# Patient Record
Sex: Female | Born: 1970 | Race: White | Hispanic: No | Marital: Single | Smoking: Former smoker
Health system: Southern US, Community
[De-identification: ages and names within clinical notes are randomized; demographics above are authoritative.]

## PROBLEM LIST (undated history)

## (undated) DIAGNOSIS — F419 Anxiety disorder, unspecified: Secondary | ICD-10-CM

## (undated) DIAGNOSIS — M779 Enthesopathy, unspecified: Secondary | ICD-10-CM

## (undated) DIAGNOSIS — E785 Hyperlipidemia, unspecified: Secondary | ICD-10-CM

## (undated) DIAGNOSIS — N189 Chronic kidney disease, unspecified: Secondary | ICD-10-CM

## (undated) DIAGNOSIS — M1990 Unspecified osteoarthritis, unspecified site: Secondary | ICD-10-CM

## (undated) DIAGNOSIS — M2010 Hallux valgus (acquired), unspecified foot: Secondary | ICD-10-CM

## (undated) DIAGNOSIS — F32A Depression, unspecified: Secondary | ICD-10-CM

## (undated) DIAGNOSIS — M797 Fibromyalgia: Secondary | ICD-10-CM

## (undated) DIAGNOSIS — I739 Peripheral vascular disease, unspecified: Secondary | ICD-10-CM

## (undated) DIAGNOSIS — Z8711 Personal history of peptic ulcer disease: Secondary | ICD-10-CM

## (undated) DIAGNOSIS — I10 Essential (primary) hypertension: Secondary | ICD-10-CM

## (undated) DIAGNOSIS — M2040 Other hammer toe(s) (acquired), unspecified foot: Secondary | ICD-10-CM

## (undated) DIAGNOSIS — Z8719 Personal history of other diseases of the digestive system: Secondary | ICD-10-CM

## (undated) DIAGNOSIS — M21619 Bunion of unspecified foot: Secondary | ICD-10-CM

## (undated) DIAGNOSIS — K589 Irritable bowel syndrome without diarrhea: Secondary | ICD-10-CM

## (undated) DIAGNOSIS — R51 Headache: Secondary | ICD-10-CM

## (undated) DIAGNOSIS — I499 Cardiac arrhythmia, unspecified: Secondary | ICD-10-CM

## (undated) DIAGNOSIS — D649 Anemia, unspecified: Secondary | ICD-10-CM

## (undated) DIAGNOSIS — F329 Major depressive disorder, single episode, unspecified: Secondary | ICD-10-CM

## (undated) DIAGNOSIS — E119 Type 2 diabetes mellitus without complications: Secondary | ICD-10-CM

## (undated) DIAGNOSIS — K219 Gastro-esophageal reflux disease without esophagitis: Secondary | ICD-10-CM

## (undated) DIAGNOSIS — R519 Headache, unspecified: Secondary | ICD-10-CM

## (undated) DIAGNOSIS — J342 Deviated nasal septum: Secondary | ICD-10-CM

## (undated) HISTORY — PX: ABDOMINAL HYSTERECTOMY: SHX81

## (undated) HISTORY — DX: Type 2 diabetes mellitus without complications: E11.9

## (undated) HISTORY — DX: Fibromyalgia: M79.7

## (undated) HISTORY — DX: Essential (primary) hypertension: I10

## (undated) HISTORY — DX: Hyperlipidemia, unspecified: E78.5

## (undated) HISTORY — PX: TUBAL LIGATION: SHX77

## (undated) HISTORY — PX: LEG SURGERY: SHX1003

## (undated) HISTORY — DX: Irritable bowel syndrome, unspecified: K58.9

## (undated) HISTORY — DX: Anemia, unspecified: D64.9

---

## 2008-05-28 ENCOUNTER — Emergency Department (HOSPITAL_COMMUNITY): Admission: EM | Admit: 2008-05-28 | Discharge: 2008-05-29 | Payer: Self-pay | Admitting: Emergency Medicine

## 2008-05-31 ENCOUNTER — Emergency Department (HOSPITAL_COMMUNITY): Admission: EM | Admit: 2008-05-31 | Discharge: 2008-05-31 | Payer: Self-pay | Admitting: Emergency Medicine

## 2011-08-11 ENCOUNTER — Emergency Department (HOSPITAL_COMMUNITY)
Admission: EM | Admit: 2011-08-11 | Discharge: 2011-08-11 | Disposition: A | Discharge status: Home / Self Care | Payer: Self-pay | Attending: Emergency Medicine | Admitting: Emergency Medicine

## 2011-08-11 ENCOUNTER — Encounter (HOSPITAL_COMMUNITY): Payer: Self-pay | Admitting: *Deleted

## 2011-08-11 DIAGNOSIS — F17200 Nicotine dependence, unspecified, uncomplicated: Secondary | ICD-10-CM | POA: Insufficient documentation

## 2011-08-11 DIAGNOSIS — M549 Dorsalgia, unspecified: Secondary | ICD-10-CM

## 2011-08-11 DIAGNOSIS — R070 Pain in throat: Secondary | ICD-10-CM | POA: Insufficient documentation

## 2011-08-11 DIAGNOSIS — Z79899 Other long term (current) drug therapy: Secondary | ICD-10-CM | POA: Insufficient documentation

## 2011-08-11 DIAGNOSIS — M545 Low back pain, unspecified: Secondary | ICD-10-CM | POA: Insufficient documentation

## 2011-08-11 DIAGNOSIS — K219 Gastro-esophageal reflux disease without esophagitis: Secondary | ICD-10-CM | POA: Insufficient documentation

## 2011-08-11 HISTORY — DX: Gastro-esophageal reflux disease without esophagitis: K21.9

## 2011-08-11 MED ORDER — OXYCODONE-ACETAMINOPHEN 5-325 MG PO TABS: 2.0000 | ORAL_TABLET | ORAL | Status: AC | PRN

## 2011-08-11 MED ORDER — KETOROLAC TROMETHAMINE 60 MG/2ML IM SOLN
60.0000 mg | Freq: Once | INTRAMUSCULAR | Status: AC
  Administered 2011-08-11: 60 mg via INTRAMUSCULAR
  Filled 2011-08-11: qty 2

## 2011-08-11 NOTE — ED Notes (Addendum)
Patient reports she bent over 2 days ago and she has had pain since.  She states she has pain in her neck and shoulders. She has intermittent numbness in her right hand.   She is also concerned about a sore throat

## 2011-08-11 NOTE — ED Provider Notes (Signed)
History     CSN: [id number]  Arrival date & time 08/11/11  1435   First MD Initiated Contact with Patient 08/11/11 1521      Chief Complaint  Patient presents with  . Back Pain    (Consider location/radiation/quality/duration/timing/severity/associated sxs/prior treatment) Patient is a 41 y.o. female presenting with back pain. The history is provided by the patient.  Back Pain  Pertinent negatives include no fever, no numbness, no headaches, no dysuria and no weakness.   the patient is a 41 year old, female, who complains of lower back pain since 2 days ago.  She was bending over to put on her shoes when the pain started.  Yesterday.  It occurred twice and then, today.  It has occurred 5 times.  She denies nausea, vomiting, fevers, chills, urinary tract symptoms.  She denies radiation into her lower extremities.  She denies weakness, or paresthesias.  She also has a sore throat, which has been going on for several weeks.  Past Medical History  Diagnosis Date  . GERD (gastroesophageal reflux disease)     Past Surgical History  Procedure Date  . Leg surgery   . Tubal ligation   . Abdominal surgery     No family history on file.  History  Substance Use Topics  . Smoking status: Current Everyday Smoker  . Smokeless tobacco: Current User  . Alcohol Use: Yes    OB History    Grav Para Term Preterm Abortions TAB SAB Ect Mult Living                  Review of Systems  Constitutional: Negative for fever and chills.  HENT: Positive for sore throat.   Gastrointestinal: Negative for nausea and vomiting.  Genitourinary: Negative for dysuria and hematuria.  Musculoskeletal: Positive for back pain.  Skin: Negative for rash.  Neurological: Negative for weakness, numbness and headaches.  All other systems reviewed and are negative.    Allergies  Review of patient's allergies indicates no known allergies.  Home Medications   Current Outpatient Rx  Name Route Sig  Dispense Refill  . ALUM HYDROXIDE-MAG CARBONATE 508-475 MG/10ML PO SUSP Oral Take 30 mLs by mouth 4 (four) times daily as needed. For indigestion    . HYDROCODONE-ACETAMINOPHEN 5-325 MG PO TABS Oral Take 1 tablet by mouth every 6 (six) hours as needed. For pain    . OMEPRAZOLE 20 MG PO CPDR Oral Take 20 mg by mouth daily.      BP 132/88  Pulse 84  Temp(Src) 97.6 F (36.4 C) (Oral)  Resp 18  SpO2 98%  Physical Exam  Vitals reviewed. Constitutional: She is oriented to person, place, and time. She appears well-developed and well-nourished.  HENT:  Head: Normocephalic and atraumatic.  Mouth/Throat: Oropharynx is clear and moist. No oropharyngeal exudate.  Eyes: Conjunctivae and EOM are normal.  Neck: Normal range of motion. Neck supple.  Cardiovascular:  No murmur heard. Pulmonary/Chest: Effort normal.  Abdominal: Soft. She exhibits no distension.  Musculoskeletal:       Lumbar paraspinal tenderness  Neurological: She is alert and oriented to person, place, and time.  Skin: Skin is warm and dry.  Psychiatric: She has a normal mood and affect. Thought content normal.    ED Course  Procedures (including critical care time) Probable muscular back pain.  No history of trauma.  No systemic symptoms.  No urinary tract symptoms There is no indication for testing  Labs Reviewed - No data to display No results found.  No diagnosis found.    MDM  Back pain        Cheri Guppy, MD 08/11/11 1549

## 2011-08-11 NOTE — Discharge Instructions (Signed)
Use ibuprofen 600 mg every 6 hours for pain.  Use Percocet for more severe pain.  Followup with your Dr. if your symptoms.  Last wound 3-4 days.  Return for worse or uncontrolled symptoms

## 2012-04-05 ENCOUNTER — Emergency Department (HOSPITAL_COMMUNITY)
Admission: EM | Admit: 2012-04-05 | Discharge: 2012-04-05 | Disposition: A | Discharge status: Home / Self Care | Payer: No Typology Code available for payment source | Source: Home / Self Care

## 2012-04-05 ENCOUNTER — Emergency Department (HOSPITAL_COMMUNITY): Payer: No Typology Code available for payment source

## 2012-04-05 ENCOUNTER — Encounter (HOSPITAL_COMMUNITY): Payer: Self-pay

## 2012-04-05 ENCOUNTER — Ambulatory Visit (HOSPITAL_COMMUNITY)
Admission: RE | Admit: 2012-04-05 | Discharge: 2012-04-05 | Disposition: A | Discharge status: Home / Self Care | Payer: No Typology Code available for payment source | Source: Ambulatory Visit | Attending: Family Medicine | Admitting: Family Medicine

## 2012-04-05 ENCOUNTER — Ambulatory Visit (HOSPITAL_COMMUNITY): Admission: RE | Admit: 2012-04-05 | Payer: No Typology Code available for payment source | Source: Ambulatory Visit

## 2012-04-05 DIAGNOSIS — M5030 Other cervical disc degeneration, unspecified cervical region: Secondary | ICD-10-CM

## 2012-04-05 DIAGNOSIS — J329 Chronic sinusitis, unspecified: Secondary | ICD-10-CM

## 2012-04-05 DIAGNOSIS — K219 Gastro-esophageal reflux disease without esophagitis: Secondary | ICD-10-CM

## 2012-04-05 DIAGNOSIS — M542 Cervicalgia: Secondary | ICD-10-CM | POA: Insufficient documentation

## 2012-04-05 DIAGNOSIS — M62838 Other muscle spasm: Secondary | ICD-10-CM

## 2012-04-05 DIAGNOSIS — M5380 Other specified dorsopathies, site unspecified: Secondary | ICD-10-CM | POA: Insufficient documentation

## 2012-04-05 DIAGNOSIS — R209 Unspecified disturbances of skin sensation: Secondary | ICD-10-CM | POA: Insufficient documentation

## 2012-04-05 DIAGNOSIS — M79609 Pain in unspecified limb: Secondary | ICD-10-CM | POA: Insufficient documentation

## 2012-04-05 DIAGNOSIS — M25519 Pain in unspecified shoulder: Secondary | ICD-10-CM | POA: Insufficient documentation

## 2012-04-05 MED ORDER — PREDNISONE 20 MG PO TABS: 20.0000 mg | ORAL_TABLET | Freq: Every day | ORAL | Status: DC

## 2012-04-05 MED ORDER — AMOXICILLIN-POT CLAVULANATE 875-125 MG PO TABS: 1.0000 | ORAL_TABLET | Freq: Two times a day (BID) | ORAL | Status: DC

## 2012-04-05 MED ORDER — OMEPRAZOLE 20 MG PO CPDR: 20.0000 mg | DELAYED_RELEASE_CAPSULE | Freq: Every day | ORAL | Status: DC

## 2012-04-05 MED ORDER — CYCLOBENZAPRINE HCL 5 MG PO TABS: 5.0000 mg | ORAL_TABLET | Freq: Three times a day (TID) | ORAL | Status: DC | PRN

## 2012-04-05 MED ORDER — FLUTICASONE PROPIONATE 50 MCG/ACT NA SUSP: 2.0000 | Freq: Every day | NASAL | Status: DC

## 2012-04-05 MED ORDER — HYDROCODONE-ACETAMINOPHEN 5-325 MG PO TABS: 1.0000 | ORAL_TABLET | Freq: Four times a day (QID) | ORAL | Status: DC | PRN

## 2012-04-05 MED ORDER — GADOBENATE DIMEGLUMINE 529 MG/ML IV SOLN
15.0000 mL | Freq: Once | INTRAVENOUS | Status: AC | PRN
  Administered 2012-04-05: 15 mL via INTRAVENOUS

## 2012-04-05 NOTE — ED Provider Notes (Signed)
History     CSN: [id number]  Arrival date & time 04/05/12  1135  Chief Complaint  Patient presents with  . Neck Pain  Patient is a 41 y.o. female presenting with neck pain.  Neck Pain  Associated symptoms include numbness and weakness.  Pt reports that she has been having chronic pain in neck and shoulder for the past year.  She has been having constant throbbing and numbness of right hand and arm and now involving the left shoulder and arm. Pt has never had xrays and treated with pain meds but says that symptoms have given.  Pt says she is having constant pain and has been concerned because she is having trouble sleeping and having trouble dealing with the constant discomfort and says that she is having trouble working because she can't stand for long periods of time.  Pt says that she has chronic sinus pain, sinus headaches and postnasal drainage symptoms.    Past Medical History  Diagnosis Date  . GERD (gastroesophageal reflux disease)     Past Surgical History  Procedure Date  . Leg surgery   . Tubal ligation   . Abdominal surgery     No family history on file.  History  Substance Use Topics  . Smoking status: Current Every Day Smoker  . Smokeless tobacco: Current User  . Alcohol Use: Yes    OB History    Grav Para Term Preterm Abortions TAB SAB Ect Mult Living                 Review of Systems  HENT: Positive for nosebleeds, congestion, rhinorrhea, sneezing, neck pain and postnasal drip.   Eyes: Negative.   Respiratory: Negative.   Cardiovascular: Negative.   Gastrointestinal: Negative.   Neurological: Positive for weakness and numbness.  Hematological: Negative.   Psychiatric/Behavioral: Negative.     Allergies  Review of patient's allergies indicates no known allergies.  Home Medications   Current Outpatient Rx  Name  Route  Sig  Dispense  Refill  . OMEPRAZOLE 20 MG PO CPDR   Oral   Take 20 mg by mouth daily.         Marland Kitchen ALUM HYDROXIDE-MAG  CARBONATE 508-475 MG/10ML PO SUSP   Oral   Take 30 mLs by mouth 4 (four) times daily as needed. For indigestion         . HYDROCODONE-ACETAMINOPHEN 5-325 MG PO TABS   Oral   Take 1 tablet by mouth every 6 (six) hours as needed. For pain           BP 131/86  Pulse 88  Temp 97.7 F (36.5 C) (Oral)  Resp 19  SpO2 100%  LMP 03/22/2012  Physical Exam  Constitutional: She is oriented to person, place, and time. She appears well-developed and well-nourished.  HENT:  Head: Normocephalic and atraumatic.  Nose: Mucosal edema, rhinorrhea and sinus tenderness present. No nasal septal hematoma. No epistaxis.  No foreign bodies.  Eyes: EOM are normal. Pupils are equal, round, and reactive to light.  Neck: Normal range of motion. Neck supple.  Cardiovascular: Normal rate, regular rhythm and normal heart sounds.   Pulmonary/Chest: Effort normal and breath sounds normal.  Abdominal: Soft. Bowel sounds are normal.  Musculoskeletal: She exhibits tenderness.       Decreased range of motion of neck due to pain Mildly diminished grip strength 4/5 on Right   Neurological: She is alert and oriented to person, place, and time.  Skin: Skin is warm and  dry.  Psychiatric: She has a normal mood and affect. Her behavior is normal. Judgment and thought content normal.    ED Course  Procedures (including critical care time)  Labs Reviewed - No data to display No results found.   No diagnosis found.  MDM  IMPRESSION  Neck Pain  Suspected bulging cervical disc  Chronic Neck Pain  Insomnia  GERD  Chronic sinusitis  RECOMMENDATIONS / PLAN  Trial of Prednisone taper as ordered over 3 week period MRI c-spine/t-spine Flexeril 5mg  TID Hydrocodone 5 prn pain Augmentin 875mg  po bid #20 flonase NS  Omeprazole 20 mg daily  FOLLOW UP 1 week  The patient was given clear instructions to go to ER or return to medical center if symptoms don't improve, worsen or new problems develop.  The  patient verbalized understanding.  The patient was told to call to get lab results if they haven't heard anything in the next week.            Cleora Fleet, MD 04/05/12 1535

## 2012-04-05 NOTE — ED Notes (Signed)
C/o pain in neck arm and shoulders. Also states has sores in her throat

## 2012-04-06 ENCOUNTER — Telehealth (HOSPITAL_COMMUNITY): Payer: Self-pay

## 2012-04-06 NOTE — Telephone Encounter (Signed)
Message copied by Lestine Mount on Thu Apr 06, 2012  4:38 PM ------      Message from: Cleora Fleet      Created: Thu Apr 06, 2012  3:16 PM      Regarding: MRI Results       Please call patient with MRI Results            IMPRESSION:      1.  Tiny central disc bulge at C5-6 with no neural impingement.      2.  A tiny spurs at C3-4 with no neural impingement.      3.  Otherwise, normal MRI of the cervical and thoracic spine.  No findings to explain the patient's symptoms.            I recommend that the patient see a neurologist for referral.              Maryln Manuel, MD

## 2012-06-08 ENCOUNTER — Emergency Department (HOSPITAL_COMMUNITY)
Admission: EM | Admit: 2012-06-08 | Discharge: 2012-06-08 | Disposition: A | Discharge status: Home / Self Care | Payer: No Typology Code available for payment source | Source: Home / Self Care | Attending: Family Medicine | Admitting: Family Medicine

## 2012-06-08 ENCOUNTER — Encounter (HOSPITAL_COMMUNITY): Payer: Self-pay

## 2012-06-08 DIAGNOSIS — M542 Cervicalgia: Secondary | ICD-10-CM

## 2012-06-08 DIAGNOSIS — K219 Gastro-esophageal reflux disease without esophagitis: Secondary | ICD-10-CM

## 2012-06-08 DIAGNOSIS — J329 Chronic sinusitis, unspecified: Secondary | ICD-10-CM

## 2012-06-08 DIAGNOSIS — J309 Allergic rhinitis, unspecified: Secondary | ICD-10-CM

## 2012-06-08 MED ORDER — AMOXICILLIN-POT CLAVULANATE 875-125 MG PO TABS: 1.0000 | ORAL_TABLET | Freq: Two times a day (BID) | ORAL | Status: DC

## 2012-06-08 MED ORDER — AMITRIPTYLINE HCL 50 MG PO TABS: ORAL_TABLET | ORAL | Status: DC

## 2012-06-08 MED ORDER — CYCLOBENZAPRINE HCL 5 MG PO TABS: 5.0000 mg | ORAL_TABLET | Freq: Three times a day (TID) | ORAL | Status: DC | PRN

## 2012-06-08 MED ORDER — LORATADINE 10 MG PO TABS: 10.0000 mg | ORAL_TABLET | Freq: Every day | ORAL | Status: DC

## 2012-06-08 MED ORDER — MOMETASONE FUROATE 50 MCG/ACT NA SUSP: 1.0000 | Freq: Every day | NASAL | Status: DC

## 2012-06-08 NOTE — ED Notes (Signed)
Complains of sore throat  Sinus issues Pain on right side of head Neck, back and shoulder pain

## 2012-06-08 NOTE — ED Provider Notes (Signed)
History   CSN: [id number]  Arrival date & time 06/08/12  1522   First MD Initiated Contact with Patient 06/08/12 1700    Chief Complaint  Patient presents with  . Sore Throat  . Facial Pain   HPI Pt is presenting today to report that she is still having sinus pressure and drainage.  She is having sinus headaches.  Her neck pain has improved but still present.  She is still having spasms in her neck.  I reviewed her MRI results with her today.   She says she is having trouble sleeping and having some ulcers in the mouth.  She says that she has been taking a lot of ibuprofen recently and had been taking it every 4 hours.  I explained to her that she should not be taking ibuprofen more than every 8 hours and it placed her at risk for ulcers and organ damage.  The patient verbalized understanding.  She is not using the flonase [facility] any longer.   Past Medical History  Diagnosis Date  . GERD (gastroesophageal reflux disease)     Past Surgical History  Procedure Laterality Date  . Leg surgery    . Tubal ligation    . Abdominal surgery      No family history on file.  History  Substance Use Topics  . Smoking status: Current Every Day Smoker  . Smokeless tobacco: Current User  . Alcohol Use: Yes    OB History   Grav Para Term Preterm Abortions TAB SAB Ect Mult Living                 Review of Systems  Constitutional: Positive for fatigue.  HENT: Positive for congestion, sore throat, rhinorrhea, sneezing, postnasal drip and sinus pressure.   Musculoskeletal: Positive for arthralgias.  All other systems reviewed and are negative.   Allergies  Review of patient's allergies indicates no known allergies.  Home Medications   Current Outpatient Rx  Name  Route  Sig  Dispense  Refill  . amoxicillin-clavulanate (AUGMENTIN) 875-125 MG per tablet   Oral   Take 1 tablet by mouth 2 (two) times daily.   20 tablet   0   .  cyclobenzaprine (FLEXERIL) 5 MG tablet   Oral   Take 1 tablet (5 mg total) by mouth 3 (three) times daily as needed for muscle spasms.   30 tablet   0   . fluticasone (FLONASE) 50 MCG/ACT nasal spray   Nasal   Place 2 sprays into the nose daily.   16 g   2   . HYDROcodone-acetaminophen (NORCO/VICODIN) 5-325 MG per tablet   Oral   Take 1 tablet by mouth every 6 (six) hours as needed. For pain   30 tablet   0   . omeprazole (PRILOSEC) 20 MG capsule   Oral   Take 1 capsule (20 mg total) by mouth daily.   30 capsule   3   . predniSONE (DELTASONE) 20 MG tablet   Oral   Take 1 tablet (20 mg total) by mouth daily. TAKE 3 TABS PO DAILY FOR 7 DAYS THEN TAKE 2 TABS PO DAILY FOR 7 DAYS, THEN TAKE 1 TAB PO DAILY FOR 7 DAYS   42 tablet   0    BP 121/80  Pulse 75  Temp(Src) 98.4 F (36.9 C) (Oral)  Resp 16  SpO2 98%  Physical Exam  Nursing note and vitals reviewed. Constitutional:  She is oriented to person, place, and time. She appears well-developed and well-nourished. No distress.  HENT:  Head: Normocephalic and atraumatic.  Nose: Mucosal edema, rhinorrhea and sinus tenderness present. Right sinus exhibits maxillary sinus tenderness. Left sinus exhibits maxillary sinus tenderness.  Eyes: Conjunctivae are normal. Pupils are equal, round, and reactive to light.  Neck: Normal range of motion. Neck supple. No JVD present. No thyromegaly present.  Cardiovascular: Normal rate, regular rhythm and normal heart sounds.   Pulmonary/Chest: Effort normal and breath sounds normal. No respiratory distress. She has no wheezes. She has no rales. She exhibits no tenderness.  Abdominal: Soft. Bowel sounds are normal. She exhibits no distension and no mass. There is no tenderness. There is no rebound and no guarding.  Musculoskeletal: Normal range of motion. She exhibits tenderness.  Spasm on muscles of neck and shoulder   Lymphadenopathy:    She has no cervical adenopathy.  Neurological: She  is alert and oriented to person, place, and time. No cranial nerve deficit. She exhibits normal muscle tone. Coordination normal.  Skin: Skin is warm and dry. No rash noted. No erythema. No pallor.  Psychiatric: She has a normal mood and affect. Her behavior is normal. Judgment and thought content normal.   ED Course  Procedures (including critical care time)  Labs Reviewed - No data to display No results found.  No diagnosis found.  MDM  IMPRESSION  Sinusitis  Sinus headache  Cervical neck spasm  Insomnia  Chronic pain  Allergic rhinitis  GERD  NSAID misuse  RECOMMENDATIONS / PLAN Will try another course of augmentin 875 mg po bid, nasonex nasal spray, loratadine 10 mg po daily, Pt may need ENT referral if no improvement after 1 week Trial of amitriptyline 25 mg by mouth each bedtime for one week titrating to 50 mg by mouth each bedtime to help with her chronic pain and insomnia and stress anxiety I asked her followup in one week if her sinuses are clearing up.  Otherwise follow up in one month for medical checkup.  The patient was strongly advised against the misuse of NSAIDS and the dangers associated with misuse of these drugs. The patient verbalized understanding.   FOLLOW UP 1 week if not improving 1 month for regular medical follow up   The patient was given clear instructions to go to ER or return to medical center if symptoms don't improve, worsen or new problems develop.  The patient verbalized understanding.  The patient was told to call to get lab results if they haven't heard anything in the next week.            Cleora Fleet, MD 06/08/12 1950

## 2012-06-09 NOTE — ED Notes (Signed)
[facility]  [facility] @ 8[facility]45am

## 2012-06-09 NOTE — ED Notes (Signed)
[facility]

## 2012-06-23 ENCOUNTER — Encounter: Payer: No Typology Code available for payment source | Admitting: Obstetrics & Gynecology

## 2012-07-06 ENCOUNTER — Emergency Department (HOSPITAL_COMMUNITY)
Admission: EM | Admit: 2012-07-06 | Discharge: 2012-07-06 | Disposition: A | Discharge status: Home / Self Care | Payer: No Typology Code available for payment source | Source: Home / Self Care

## 2012-07-06 ENCOUNTER — Encounter: Payer: Self-pay | Admitting: Obstetrics & Gynecology

## 2012-07-06 ENCOUNTER — Encounter (HOSPITAL_COMMUNITY): Payer: Self-pay

## 2012-07-06 DIAGNOSIS — J309 Allergic rhinitis, unspecified: Secondary | ICD-10-CM

## 2012-07-06 MED ORDER — AMLODIPINE BESYLATE 10 MG PO TABS: 10.0000 mg | ORAL_TABLET | Freq: Every day | ORAL | Status: DC

## 2012-07-06 MED ORDER — CYCLOBENZAPRINE HCL 5 MG PO TABS: 5.0000 mg | ORAL_TABLET | Freq: Two times a day (BID) | ORAL | Status: DC | PRN

## 2012-07-06 NOTE — ED Notes (Signed)
Follow up-sinus infection neck pain

## 2012-07-06 NOTE — ED Notes (Signed)
Patient Demographics  Isabel Evans, is a 42 y.o. female  BMW:[id number]  UVO:[id number]  DOB - 07-05-1970  Chief Complaint  Patient presents with  . Follow-up        Subjective:   Isabel Evans been having issues with her right maxillary sinus pressure for a while, was seen here a week ago and was prescribed Augmentin, prednisone, Flonase, Claritin however she continues to have right frontal sinus pressure, no fever chills, no postnasal drip. Also has some nodules developing on her fingers over the last 2 years.  Objective:    Filed Vitals:   07/06/12 1311  BP: 144/107  Pulse: 97  Temp: 98.1 F (36.7 C)  TempSrc: Oral  SpO2: 99%     Exam  Awake Alert, Oriented X 3, No new F.N deficits, Normal affect [place].AT,PERRAL, minimal right frontal sinus pressure however no tenderness or warmth Supple Neck,No JVD, No cervical lymphadenopathy appriciated.  Symmetrical Chest wall movement, Good air movement bilaterally, CTAB RRR,No Gallops,Rubs or new Murmurs, No Parasternal Heave +ve B.Sounds, Abd Soft, Non tender, No organomegaly appriciated, No rebound - guarding or rigidity. No Cyanosis, Clubbing or edema, No new Rash or bruise Soft tissue nodules noted overlying the knuckles of pitting much all the fingers in both hands. They're nontender, not warm or red, they've been there for a few years.     Data Review   CBC No results found for this basename: WBC, HGB, HCT, PLT, MCV, MCH, MCHC, RDW, NEUTRABS, LYMPHSABS, MONOABS, EOSABS, BASOSABS, BANDABS, BANDSABD,  in the last 168 hours  Chemistries   No results found for this basename: NA, K, CL, CO2, GLUCOSE, BUN, CREATININE, GFRCGP, CALCIUM, MG, AST, ALT, ALKPHOS, BILITOT,  in the last 168 hours ------------------------------------------------------------------------------------------------------------------ No results found for this basename: HGBA1C,  in the last 72  hours ------------------------------------------------------------------------------------------------------------------ No results found for this basename: CHOL, HDL, LDLCALC, TRIG, CHOLHDL, LDLDIRECT,  in the last 72 hours ------------------------------------------------------------------------------------------------------------------ No results found for this basename: TSH, T4TOTAL, FREET3, T3FREE, THYROIDAB,  in the last 72 hours ------------------------------------------------------------------------------------------------------------------ No results found for this basename: VITAMINB12, FOLATE, FERRITIN, TIBC, IRON, RETICCTPCT,  in the last 72 hours  Coagulation profile  No results found for this basename: INR, PROTIME,  in the last 168 hours     Prior to Admission medications   Medication Sig Start Date End Date Taking? Authorizing Provider  amitriptyline (ELAVIL) 50 MG tablet Take 1/2 tab po QHS x 10 days, then take 1 po QHS 06/08/12   Clanford Cyndie Mull, MD  amoxicillin-clavulanate (AUGMENTIN) 875-125 MG per tablet Take 1 tablet by mouth 2 (two) times daily. 06/08/12   Clanford Cyndie Mull, MD  cyclobenzaprine (FLEXERIL) 5 MG tablet Take 1 tablet (5 mg total) by mouth every 12 (twelve) hours as needed for muscle spasms. 07/06/12   Leroy Sea, MD  HYDROcodone-acetaminophen (NORCO/VICODIN) 5-325 MG per tablet Take 1 tablet by mouth every 6 (six) hours as needed. For pain 04/05/12   Clanford Cyndie Mull, MD  loratadine (CLARITIN) 10 MG tablet Take 1 tablet (10 mg total) by mouth daily. 06/08/12   Clanford Cyndie Mull, MD  mometasone (NASONEX) 50 MCG/ACT nasal spray Place 1 spray into the nose daily. 06/08/12   Clanford Cyndie Mull, MD  omeprazole (PRILOSEC) 20 MG capsule Take 1 capsule (20 mg total) by mouth daily. 04/05/12   Clanford Cyndie Mull, MD  predniSONE (DELTASONE) 20 MG tablet Take 1 tablet (20 mg total) by mouth daily. TAKE 3 TABS PO DAILY FOR 7 DAYS THEN TAKE 2  TABS PO DAILY FOR 7  DAYS, THEN TAKE 1 TAB PO DAILY FOR 7 DAYS 04/05/12   Clanford Cyndie Mull, MD     Assessment & Plan   Right maxillary sinus pressure. She has failed antibiotic steroid and Claritin treatment, I doubt this is infectious, no postnasal drip no tenderness on patient, no fevers, and at this time I will refer her to ENT physician with continuing present care. Have advised her to take steam inhalations for symptomatic relief.  Soft tissue nodules on the knuckles of pretty much all fingers there for the last few years but now increasing in number, I have ordered a rheumatoid factor, anti-CCP antibody, uric acid levels and lipid profile. Patient to come back in a month for followup.  Follow-up Information   Follow up with this clinic. Schedule an appointment as soon as possible for a visit in 1 week.      Follow up with Dillard Cannon, MD. Schedule an appointment as soon as possible for a visit in 1 week.   Contact information:   [address] [place] [address] [phone]        Leroy Sea M.D on 07/06/2012 at 1:38 PM   Leroy Sea, MD 07/06/12 [phone]

## 2012-07-11 NOTE — ED Notes (Signed)
Pt has appt 07/07/12 @ 2pm with dr Annalee Genta [facility] [facility]

## 2012-07-13 ENCOUNTER — Encounter: Payer: Self-pay | Admitting: Obstetrics & Gynecology

## 2012-07-13 ENCOUNTER — Telehealth: Payer: Self-pay | Admitting: *Deleted

## 2012-07-13 ENCOUNTER — Ambulatory Visit (INDEPENDENT_AMBULATORY_CARE_PROVIDER_SITE_OTHER): Payer: No Typology Code available for payment source | Admitting: Obstetrics & Gynecology

## 2012-07-13 ENCOUNTER — Other Ambulatory Visit (HOSPITAL_COMMUNITY)
Admission: RE | Admit: 2012-07-13 | Discharge: 2012-07-13 | Disposition: A | Discharge status: Home / Self Care | Payer: No Typology Code available for payment source | Source: Ambulatory Visit | Attending: Obstetrics & Gynecology | Admitting: Obstetrics & Gynecology

## 2012-07-13 VITALS — BP 116/83 | HR 89 | Temp 97.0°F | Ht 69.0 in | Wt 172.5 lb

## 2012-07-13 DIAGNOSIS — N8500 Endometrial hyperplasia, unspecified: Secondary | ICD-10-CM | POA: Insufficient documentation

## 2012-07-13 DIAGNOSIS — N938 Other specified abnormal uterine and vaginal bleeding: Secondary | ICD-10-CM

## 2012-07-13 DIAGNOSIS — N921 Excessive and frequent menstruation with irregular cycle: Secondary | ICD-10-CM

## 2012-07-13 DIAGNOSIS — N949 Unspecified condition associated with female genital organs and menstrual cycle: Secondary | ICD-10-CM | POA: Insufficient documentation

## 2012-07-13 DIAGNOSIS — N920 Excessive and frequent menstruation with regular cycle: Secondary | ICD-10-CM

## 2012-07-13 LAB — CBC
Hemoglobin: 14.4 g/dL (ref 12.0–15.0)
RBC: 4.49 MIL/uL (ref 3.87–5.11)
WBC: 6.6 10*3/uL (ref 4.0–10.5)

## 2012-07-13 LAB — POCT PREGNANCY, URINE: Preg Test, Ur: NEGATIVE

## 2012-07-13 NOTE — Progress Notes (Signed)
Patient states period lasts at least 7 days and is very heavy for last 6 months; States she passes blood clots, "big ones" and sometimes gets periods twice a month. Also reports bad headaches, bad menstrual cramps and dyspareunia and hair thinning out

## 2012-07-13 NOTE — Progress Notes (Signed)
Subjective:     Patient ID: Isabel Evans, female   DOB: 14-Feb-1971, 42 y.o.   MRN: [id number]  HPI 42 yo [id number]. Pt reports heavy irregular cycles since Jan.   She is currently using a pad and an tampon at the same time with her cycles.  She reports that her last PAP was 8years previously.  She denies weight loss or other constitutional sx.  She has never had a sono or other eval of her abnormal bleeding.  Past Medical History  Diagnosis Date  . GERD (gastroesophageal reflux disease)   . Hypertension       Past Surgical History  Procedure Laterality Date  . Leg surgery    . Tubal ligation    . Abdominal surgery     Current Outpatient Prescriptions on File Prior to Visit  Medication Sig Dispense Refill  . amitriptyline (ELAVIL) 50 MG tablet Take 1/2 tab po QHS x 10 days, then take 1 po QHS  30 tablet  1  . amLODipine (NORVASC) 10 MG tablet Take 1 tablet (10 mg total) by mouth daily.  30 tablet  0  . cyclobenzaprine (FLEXERIL) 5 MG tablet Take 1 tablet (5 mg total) by mouth every 12 (twelve) hours as needed for muscle spasms.  30 tablet  0  . loratadine (CLARITIN) 10 MG tablet Take 1 tablet (10 mg total) by mouth daily.  30 tablet  1  . mometasone (NASONEX) 50 MCG/ACT nasal spray Place 1 spray into the nose daily.  17 g  1  . omeprazole (PRILOSEC) 20 MG capsule Take 1 capsule (20 mg total) by mouth daily.  30 capsule  3  . [DISCONTINUED] fluticasone (FLONASE) 50 MCG/ACT nasal spray Place 2 sprays into the nose daily.  16 g  2   No current facility-administered medications on file prior to visit.   No Known Allergies History   Social History  . Marital Status: Single    Spouse Name: N/A    Number of Children: N/A  . Years of Education: N/A   Occupational History  . Not on file.   Social History Main Topics  . Smoking status: Current Every Day Smoker    Types: Cigarettes  . Smokeless tobacco: Current User  . Alcohol Use: No  . Drug Use: No  . Sexually Active: Yes   Birth Control/ Protection: Surgical   Other Topics Concern  . Not on file   Social History Narrative  . No narrative on file      Review of Systems     Objective:   Physical Exam BP 116/83  Pulse 89  Temp(Src) 97 F (36.1 C) (Oral)  Ht 5\' 9"  (1.753 m)  Wt 172 lb 8 oz (78.245 kg)  BMI 25.46 kg/m2  LMP 07/05/2012 Lungs: CTA CV: RRR Abd: obese, NT, ND GU: EGBUS: no lesions Vagina: no blood in vault Cervix: no lesion; no mucopurulent d/c Uterus: small, mobile Adnexa: no masses; sl tender  The indications for endometrial biopsy were reviewed.   Risks of the biopsy including cramping, bleeding, infection, uterine perforation, inadequate specimen and need for additional procedures  were discussed. The patient states she understands and agrees to undergo procedure today. Consent was signed. Time out was performed. Urine HCG was negative. A sterile speculum was placed in the patient's vagina and the cervix was prepped with Betadine. A single-toothed tenaculum was placed on the anterior lip of the cervix to stabilize it. The 3 mm pipelle was introduced into the endometrial cavity without  difficulty to a depth of 7cm, and a moderate amount of tissue was obtained and sent to pathology. The instruments were removed from the patient's vagina. Minimal bleeding from the cervix was noted. The patient tolerated the procedure well.         Assessment:     DUB/heavy irregular cycles     Plan:     Lab: TSH, CBC F/u PAP and endobx reusults No treatment given today Encouraged 3 months tobacco cessation Needs pelvic sono Routine post-procedure instructions were given to the patient. The patient will follow up in 4 weeks to review the results and for further management.

## 2012-07-13 NOTE — Telephone Encounter (Signed)
Message copied by Gerome Apley on Thu Jul 13, 2012  5:15 PM ------      Message from: Willodean Rosenthal      Created: Thu Jul 13, 2012  5:01 PM       Pt needs sonogram            Order in             clh-S ------

## 2012-07-13 NOTE — Patient Instructions (Addendum)
Menorrhagia Dysfunctional uterine bleeding is different from a normal menstrual period. When periods are heavy or there is more bleeding than is usual for you, it is called menorrhagia. It may be caused by hormonal imbalance, or physical, metabolic, or other problems. Examination is necessary in order that your caregiver may treat treatable causes. If this is a continuing problem, a D&C may be needed. That means that the cervix (the opening of the uterus or womb) is dilated (stretched larger) and the lining of the uterus is scraped out. The tissue scraped out is then examined under a microscope by a specialist (pathologist) to make sure there is nothing of concern that needs further or more extensive treatment. HOME CARE INSTRUCTIONS   If medications were prescribed, take exactly as directed. Do not change or switch medications without consulting your caregiver.  Long term heavy bleeding may result in iron deficiency. Your caregiver may have prescribed iron pills. They help replace the iron your body lost from heavy bleeding. Take exactly as directed. Iron may cause constipation. If this becomes a problem, increase the bran, fruits, and roughage in your diet.  Do not take aspirin or medicines that contain aspirin one week before or during your menstrual period. Aspirin may make the bleeding worse.  If you need to change your sanitary pad or tampon more than once every 2 hours, stay in bed and rest as much as possible until the bleeding stops.  Eat well-balanced meals. Eat foods high in iron. Examples are leafy green vegetables, meat, liver, eggs, and whole grain breads and cereals. Do not try to lose weight until the abnormal bleeding has stopped and your blood iron level is back to normal. SEEK MEDICAL CARE IF:   You need to change your sanitary pad or tampon more than once an hour.  You develop nausea (feeling sick to your stomach) and vomiting, dizziness, or diarrhea while you are taking your  medicine.  You have any problems that may be related to the medicine you are taking. SEEK IMMEDIATE MEDICAL CARE IF:   You have a fever.  You develop chills.  You develop severe bleeding or start to pass blood clots.  You feel dizzy or faint. MAKE SURE YOU:   Understand these instructions.  Will watch your condition.  Will get help right away if you are not doing well or get worse. Document Released: 04/05/2005 Document Revised: 06/28/2011 Document Reviewed: 11/24/2007 [facility] Patient Information 2013 [place], [place]. Dysfunctional Uterine Bleeding Normally, menstrual periods begin between ages 44 to 9 in young women. A normal menstrual cycle/period may begin every 23 days up to 35 days and lasts from 1 to 7 days. Around 12 to 14 days before your menstrual period starts, ovulation (ovary produces an egg) occurs. When counting the time between menstrual periods, count from the first day of bleeding of the previous period to the first day of bleeding of the next period. Dysfunctional (abnormal) uterine bleeding is bleeding that is different from a normal menstrual period. Your periods may come earlier or later than usual. They may be lighter, have blood clots or be heavier. You may have bleeding between periods, or you may skip one period or more. You may have bleeding after sexual intercourse, bleeding after menopause, or no menstrual period. CAUSES   Pregnancy (normal, miscarriage, tubal).  IUDs (intrauterine device, birth control).  Birth control pills.  Hormone treatment.  Menopause.  Infection of the cervix.  Blood clotting problems.  Infection of the inside lining of the  uterus.  Endometriosis, inside lining of the uterus growing in the pelvis and other female organs.  Adhesions (scar tissue) inside the uterus.  Obesity or severe weight loss.  Uterine polyps inside the uterus.  Cancer of the vagina, cervix, or uterus.  Ovarian cysts or polycystic ovary  syndrome.  Medical problems (diabetes, thyroid disease).  Uterine fibroids (noncancerous tumor).  Problems with your female hormones.  Endometrial hyperplasia, very thick lining and enlarged cells inside of the uterus.  Medicines that interfere with ovulation.  Radiation to the pelvis or abdomen.  Chemotherapy. DIAGNOSIS   Your doctor will discuss the history of your menstrual periods, medicines you are taking, changes in your weight, stress in your life, and any medical problems you may have.  Your doctor will do a physical and pelvic examination.  Your doctor may want to perform certain tests to make a diagnosis, such as:  Pap test.  Blood tests.  Cultures for infection.  CT scan.  Ultrasound.  Hysteroscopy.  Laparoscopy.  MRI.  Hysterosalpingography.  D and C.  Endometrial biopsy. TREATMENT  Treatment will depend on the cause of the dysfunctional uterine bleeding (DUB). Treatment may include:  Observing your menstrual periods for a couple of months.  Prescribing medicines for medical problems, including:  Antibiotics.  Hormones.  Birth control pills.  Removing an IUD (intrauterine device, birth control).  Surgery:  D and C (scrape and remove tissue from inside the uterus).  Laparoscopy (examine inside the abdomen with a lighted tube).  Uterine ablation (destroy lining of the uterus with electrical current, laser, heat, or freezing).  Hysteroscopy (examine cervix and uterus with a lighted tube).  Hysterectomy (remove the uterus). HOME CARE INSTRUCTIONS   If medicines were prescribed, take exactly as directed. Do not change or switch medicines without consulting your caregiver.  Long term heavy bleeding may result in iron deficiency. Your caregiver may have prescribed iron pills. They help replace the iron that your body lost from heavy bleeding. Take exactly as directed.  Do not take aspirin or medicines that contain aspirin one week  before or during your menstrual period. Aspirin may make the bleeding worse.  If you need to change your sanitary pad or tampon more than once every 2 hours, stay in bed with your feet elevated and a cold pack on your lower abdomen. Rest as much as possible, until the bleeding stops or slows down.  Eat well-balanced meals. Eat foods high in iron. Examples are:  Leafy green vegetables.  Whole-grain breads and cereals.  Eggs.  Meat.  Liver.  Do not try to lose weight until the abnormal bleeding has stopped and your blood iron level is back to normal. Do not lift more than ten pounds or do strenuous activities when you are bleeding.  For a couple of months, make note on your calendar, marking the start and ending of your period, and the type of bleeding (light, medium, heavy, spotting, clots or missed periods). This is for your caregiver to better evaluate your problem. SEEK MEDICAL CARE IF:   You develop nausea (feeling sick to your stomach) and vomiting, dizziness, or diarrhea while you are taking your medicine.  You are getting lightheaded or weak.  You have any problems that may be related to the medicine you are taking.  You develop pain with your DUB.  You want to remove your IUD.  You want to stop or change your birth control pills or hormones.  You have any type of abnormal bleeding mentioned  above.  You are over 22 years old and have not had a menstrual period yet.  You are 42 years old and you are still having menstrual periods.  You have any of the symptoms mentioned above.  You develop a rash. SEEK IMMEDIATE MEDICAL CARE IF:   An oral temperature above 102 F (38.9 C) develops.  You develop chills.  You are changing your sanitary pad or tampon more than once an hour.  You develop abdominal pain.  You pass out or faint. Document Released: 04/02/2000 Document Revised: 06/28/2011 Document Reviewed: 03/04/2009 [facility] Patient Information 2013 [place],  [place]. Pap Test A Pap test is a procedure done in a clinic office to evaluate cells that are on the surface of the cervix. The cervix is the lower portion of the uterus and upper portion of the vagina. For some women, the cervical region has the potential to form cancer. With consistent evaluations by your caregiver, this type of cancer can be prevented.  If a Pap test is abnormal, it is most often a result of a previous exposure to human papillomavirus (HPV). HPV is a virus that can infect the cells of the cervix and cause dysplasia. Dysplasia is where the cells no longer look normal. If a woman has been diagnosed with high-grade or severe dysplasia, they are at higher risk of developing cervical cancer. People diagnosed with low-grade dysplasia should still be seen by their caregiver because there is a small chance that low-grade dysplasia could develop into cancer.  LET YOUR CAREGIVER KNOW ABOUT:  Recent sexually transmitted infection (STI) you have had.  Any new sex partners you have had.  History of previous abnormal Pap tests results.  History of previous cervical procedures you have had (colposcopy, biopsy, loop electrosurgical excision procedure [LEEP]).  Concerns you have had regarding unusual vaginal discharge.  History of pelvic pain.  Your use of birth control. BEFORE THE PROCEDURE  Ask your caregiver when to schedule your Pap test. It is best not to be on your period if your caregiver uses a wooden spatula to collect cells or applies cells to a glass slide. Newer techniques are not so sensitive to the timing of a menstrual cycle.  Do not douche or have sexual intercourse for 24 hours before the test.   Do not use vaginal creams or tampons for 24 hours before the test.   Empty your bladder just before the test to lessen any discomfort.  PROCEDURE You will lie on an exam table with your feet in stirrups. A warm metal or plastic instrument (speculum) is placed in your vagina.  This instrument allows your caregiver to see the inside of your vagina and look at your cervix. A small, plastic brush or wooden spatula is then used to collect cervical cells. These cells are placed in a lab specimen container. The cells are looked at under a microscope. A specialist will determine if the cells are normal.  AFTER THE PROCEDURE Make sure to get your test results.If your results come back abnormal, you may need further testing.  Document Released: 06/26/2002 Document Revised: 06/28/2011 Document Reviewed: 04/01/2011 [facility] Patient Information 2013 [place], [place].

## 2012-07-14 ENCOUNTER — Emergency Department (INDEPENDENT_AMBULATORY_CARE_PROVIDER_SITE_OTHER)
Admission: EM | Admit: 2012-07-14 | Discharge: 2012-07-14 | Disposition: A | Discharge status: Home / Self Care | Payer: No Typology Code available for payment source | Source: Home / Self Care | Attending: Family Medicine | Admitting: Family Medicine

## 2012-07-14 ENCOUNTER — Encounter (HOSPITAL_COMMUNITY): Payer: Self-pay

## 2012-07-14 DIAGNOSIS — M542 Cervicalgia: Secondary | ICD-10-CM

## 2012-07-14 DIAGNOSIS — M62838 Other muscle spasm: Secondary | ICD-10-CM

## 2012-07-14 DIAGNOSIS — I10 Essential (primary) hypertension: Secondary | ICD-10-CM

## 2012-07-14 DIAGNOSIS — J309 Allergic rhinitis, unspecified: Secondary | ICD-10-CM

## 2012-07-14 DIAGNOSIS — G4700 Insomnia, unspecified: Secondary | ICD-10-CM

## 2012-07-14 DIAGNOSIS — B070 Plantar wart: Secondary | ICD-10-CM

## 2012-07-14 LAB — GLUCOSE, CAPILLARY: Glucose-Capillary: 95 mg/dL (ref 70–99)

## 2012-07-14 LAB — TSH: TSH: 1.363 u[IU]/mL (ref 0.350–4.500)

## 2012-07-14 MED ORDER — AMITRIPTYLINE HCL 50 MG PO TABS: ORAL_TABLET | ORAL | Status: DC

## 2012-07-14 MED ORDER — OMEPRAZOLE 20 MG PO CPDR: 20.0000 mg | DELAYED_RELEASE_CAPSULE | Freq: Every day | ORAL | Status: DC

## 2012-07-14 MED ORDER — AMLODIPINE BESYLATE 10 MG PO TABS: 10.0000 mg | ORAL_TABLET | Freq: Every day | ORAL | Status: DC

## 2012-07-14 MED ORDER — LORATADINE 10 MG PO TABS: 10.0000 mg | ORAL_TABLET | Freq: Every day | ORAL | Status: DC

## 2012-07-14 MED ORDER — CYCLOBENZAPRINE HCL 5 MG PO TABS: 5.0000 mg | ORAL_TABLET | Freq: Three times a day (TID) | ORAL | Status: DC | PRN

## 2012-07-14 MED ORDER — MOMETASONE FUROATE 50 MCG/ACT NA SUSP: 1.0000 | Freq: Every day | NASAL | Status: DC

## 2012-07-14 NOTE — ED Notes (Signed)
Patient complains of pain to both hands and has a lump on bottom of right foot

## 2012-07-14 NOTE — ED Provider Notes (Signed)
History     CSN: [id number]  Arrival date & time 07/14/12  1024   First MD Initiated Contact with Patient 07/14/12 1105     Chief Complaint  Patient presents with  . Hand Pain   HPI Pt reports pain to both hands and reports and reports that she has had a callus on the bottom of right foot that has been bothering her extensively.  She reports that she is sleeping well and tolerating the elavil.  The patient reports that the callous has been filed down extensively.  She says that it always returns and it gets much more painful over the last few months.  Pt is planning to get married over the next few months.   Past Medical History  Diagnosis Date  . GERD (gastroesophageal reflux disease)   . Hypertension     Past Surgical History  Procedure Laterality Date  . Leg surgery    . Tubal ligation    . Abdominal surgery      Family History  Problem Relation Age of Onset  . Hypertension Mother   . Diabetes Mother   . Heart disease Mother   . Hypertension Father   . Diabetes Father   . Cancer Sister   . Hypertension Sister   . Diabetes Sister   . Heart disease Maternal Grandmother     History  Substance Use Topics  . Smoking status: Current Every Day Smoker    Types: Cigarettes  . Smokeless tobacco: Current User  . Alcohol Use: No    OB History   Grav Para Term Preterm Abortions TAB SAB Ect Mult Living   3 3 3  0 0 0 0 0 0 3     Review of Systems  Musculoskeletal:       Pain on right foot callous   Psychiatric/Behavioral: Positive for sleep disturbance.  All other systems reviewed and are negative.    Allergies  Review of patient's allergies indicates no known allergies.  Home Medications   Current Outpatient Rx  Name  Route  Sig  Dispense  Refill  . amitriptyline (ELAVIL) 50 MG tablet      Take 1/2 tab po QHS x 10 days, then take 1 po QHS   30 tablet   1   . amLODipine (NORVASC) 10 MG tablet   Oral   Take 1 tablet (10 mg total) by mouth daily.   30  tablet   0   . cyclobenzaprine (FLEXERIL) 5 MG tablet   Oral   Take 1 tablet (5 mg total) by mouth every 12 (twelve) hours as needed for muscle spasms.   30 tablet   0   . loratadine (CLARITIN) 10 MG tablet   Oral   Take 1 tablet (10 mg total) by mouth daily.   30 tablet   1   . mometasone (NASONEX) 50 MCG/ACT nasal spray   Nasal   Place 1 spray into the nose daily.   17 g   1   . omeprazole (PRILOSEC) 20 MG capsule   Oral   Take 1 capsule (20 mg total) by mouth daily.   30 capsule   3     BP 135/86  Pulse 98  Temp(Src) 97.9 F (36.6 C) (Oral)  SpO2 100%  LMP 07/05/2012  Physical Exam  Nursing note and vitals reviewed. Constitutional: She appears well-developed and well-nourished. No distress.  HENT:  Head: Normocephalic and atraumatic.  Cardiovascular: Normal rate, regular rhythm and normal heart sounds.  Pulmonary/Chest: Effort normal and breath sounds normal.  Musculoskeletal: Normal range of motion.       Right ankle: She exhibits normal range of motion, no swelling, no ecchymosis, no deformity, no laceration and normal pulse.       Right foot: She exhibits tenderness. She exhibits normal range of motion, no bony tenderness, no swelling, normal capillary refill, no crepitus, no deformity and no laceration.       Feet:    ED Course  Procedures (including critical care time)  Labs Reviewed - No data to display No results found.   No diagnosis found.    MDM  IMPRESSION  Plantar wart - right foot  Hypertension  insomnia  RECOMMENDATIONS / PLAN Referral to podiatry for treatment of this large painful plantar wart  Trial of topical salicylic acid to wart: Mediplast 40% salicylic acid paste: The plaster is best applied to the wart and a few millimeters of surrounding skin, taped into place with duct or athletic tape and kept dry for 48 to 72 hours. The patch is then removed, the wart pared down, and the process repeated. The patch must be taped  securely in place because it destroys all skin it contacts. If it gets wet, it must be reapplied.  Continue elavil   Monitor blood pressure, pt is tolerating amlodipine   FOLLOW UP End of May after return from honeymoon  The patient was given clear instructions to go to ER or return to medical center if symptoms don't improve, worsen or new problems develop.  The patient verbalized understanding.  The patient was told to call to get lab results if they haven't heard anything in the next week.    Results for orders placed during the hospital encounter of 07/14/12  GLUCOSE, CAPILLARY      Result Value Range   Glucose-Capillary 95  70 - 99 mg/dL         Cleora Fleet, MD 07/15/12 1010

## 2012-07-15 MED ORDER — SALICYLIC ACID 40 % EX MISC
CUTANEOUS | Status: DC
Start: 2012-07-15 — End: 2012-10-11

## 2012-07-17 ENCOUNTER — Telehealth: Payer: Self-pay

## 2012-07-17 ENCOUNTER — Telehealth (HOSPITAL_COMMUNITY): Payer: Self-pay

## 2012-07-17 NOTE — ED Notes (Signed)
Spoke with Olegario Messier at [facility].  They do not carry Mediplast RX Tried to call patient to get an alternate pharmacy- going to try and call [facility] outpatient pharmacy

## 2012-07-17 NOTE — ED Notes (Signed)
Spoke with a pharmacyst at [facility] mediplast is an OTC medication Informed patient of this information

## 2012-07-17 NOTE — Telephone Encounter (Signed)
Patient was informed of appointment for sonogram 07/18/12.  She had no questions.

## 2012-07-17 NOTE — Telephone Encounter (Signed)
Opened in error

## 2012-07-18 ENCOUNTER — Ambulatory Visit (HOSPITAL_COMMUNITY)
Admission: RE | Admit: 2012-07-18 | Discharge: 2012-07-18 | Disposition: A | Discharge status: Home / Self Care | Payer: No Typology Code available for payment source | Source: Ambulatory Visit | Attending: Obstetrics & Gynecology | Admitting: Obstetrics & Gynecology

## 2012-07-18 DIAGNOSIS — M549 Dorsalgia, unspecified: Secondary | ICD-10-CM | POA: Insufficient documentation

## 2012-07-18 DIAGNOSIS — N938 Other specified abnormal uterine and vaginal bleeding: Secondary | ICD-10-CM | POA: Insufficient documentation

## 2012-07-18 DIAGNOSIS — N921 Excessive and frequent menstruation with irregular cycle: Secondary | ICD-10-CM

## 2012-07-18 DIAGNOSIS — N949 Unspecified condition associated with female genital organs and menstrual cycle: Secondary | ICD-10-CM | POA: Insufficient documentation

## 2012-07-19 ENCOUNTER — Other Ambulatory Visit: Payer: Self-pay | Admitting: Obstetrics & Gynecology

## 2012-07-19 DIAGNOSIS — N938 Other specified abnormal uterine and vaginal bleeding: Secondary | ICD-10-CM

## 2012-07-19 MED ORDER — MEGESTROL ACETATE 20 MG PO TABS: 40.0000 mg | ORAL_TABLET | Freq: Every day | ORAL | Status: DC

## 2012-07-20 ENCOUNTER — Other Ambulatory Visit: Payer: Self-pay | Admitting: General Practice

## 2012-07-20 ENCOUNTER — Telehealth: Payer: Self-pay | Admitting: *Deleted

## 2012-07-20 ENCOUNTER — Telehealth: Payer: Self-pay | Admitting: General Practice

## 2012-07-20 ENCOUNTER — Encounter: Payer: Self-pay | Admitting: Obstetrics & Gynecology

## 2012-07-20 DIAGNOSIS — N938 Other specified abnormal uterine and vaginal bleeding: Secondary | ICD-10-CM

## 2012-07-20 MED ORDER — MEGESTROL ACETATE 20 MG PO TABS: 40.0000 mg | ORAL_TABLET | Freq: Every day | ORAL | Status: DC

## 2012-07-20 NOTE — Telephone Encounter (Signed)
Message copied by Kathee Delton on Thu Jul 20, 2012  9:32 AM ------      Message from: Willodean Rosenthal      Created: Wed Jul 19, 2012  4:48 PM       Please call pt.  sono normal and Endo suggests polyp.  Rec Megace for 6months.            Rx ordered on chart.  F/u 4 weeks or sooner prn            Thx,      clh-s ------

## 2012-07-20 NOTE — Telephone Encounter (Signed)
[facility] as prescription printed earlier because [facility] not have eprescribe.

## 2012-07-20 NOTE — Telephone Encounter (Signed)
Called patient and informed her of results and recommendations. Patient verbalized understanding. Told patient with her permission we needed to scheduled a follow up appt with the provider and patient stated that was fine and had no further questions. appt made for 4/28 @ 2

## 2012-07-21 ENCOUNTER — Telehealth: Payer: Self-pay

## 2012-07-21 NOTE — Telephone Encounter (Signed)
Dr. Erin Fulling wrote a RX for Megace and wants to know if she can start taking the medication now or if she should wait.

## 2012-07-24 NOTE — Telephone Encounter (Signed)
Replied to patient via patient messaging.

## 2012-07-25 ENCOUNTER — Encounter: Payer: Self-pay | Admitting: *Deleted

## 2012-07-26 ENCOUNTER — Telehealth: Payer: Self-pay | Admitting: General Practice

## 2012-07-26 NOTE — Telephone Encounter (Signed)
Message copied by Kathee Delton on Wed Jul 26, 2012  1:18 PM ------      Message from: Odelia Gage A      Created: Tue Jul 25, 2012 12:20 PM         Appointment 04/16 @1 :45      Should the other appointment be canceled                  ----- Message -----         From: Drucilla Schmidt Day, RN         Sent: 07/19/2012   4:45 PM           To: [facility] Admin Pool            Please schedule colpo and return to Clinical pool for pt to be contacted. 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  Please notify and schedule colpo.            Thx,      clh-s             ------

## 2012-07-26 NOTE — Telephone Encounter (Signed)
Called patient and informed her of results, recommendations and appt time and I told her that the 4/28 appt would be cancelled. Patient verbalized understanding and had no further questions

## 2012-08-02 ENCOUNTER — Other Ambulatory Visit: Payer: Self-pay | Admitting: Obstetrics & Gynecology

## 2012-08-02 ENCOUNTER — Ambulatory Visit (INDEPENDENT_AMBULATORY_CARE_PROVIDER_SITE_OTHER): Payer: No Typology Code available for payment source | Admitting: Obstetrics & Gynecology

## 2012-08-02 ENCOUNTER — Other Ambulatory Visit (HOSPITAL_COMMUNITY)
Admission: RE | Admit: 2012-08-02 | Discharge: 2012-08-02 | Disposition: A | Discharge status: Home / Self Care | Payer: No Typology Code available for payment source | Source: Ambulatory Visit | Attending: Obstetrics & Gynecology | Admitting: Obstetrics & Gynecology

## 2012-08-02 ENCOUNTER — Encounter: Payer: Self-pay | Admitting: Obstetrics & Gynecology

## 2012-08-02 VITALS — BP 111/79 | HR 95 | Temp 98.1°F | Ht 69.0 in | Wt 177.2 lb

## 2012-08-02 DIAGNOSIS — IMO0002 Reserved for concepts with insufficient information to code with codable children: Secondary | ICD-10-CM

## 2012-08-02 DIAGNOSIS — R87612 Low grade squamous intraepithelial lesion on cytologic smear of cervix (LGSIL): Secondary | ICD-10-CM

## 2012-08-02 DIAGNOSIS — N870 Mild cervical dysplasia: Secondary | ICD-10-CM | POA: Insufficient documentation

## 2012-08-02 LAB — POCT PREGNANCY, URINE: Preg Test, Ur: NEGATIVE

## 2012-08-02 NOTE — Patient Instructions (Addendum)
Cervical Dysplasia Cervical dysplasia is a condition in which a woman has abnormal changes in the cells of her cervix. The cervix is the opening to the uterus (womb) between the vagina and the uterus. These changes are called cervical dysplasia and may be the first signs of cervical cancer. These cells can be taken from the cervix during a Pap test and then looked at under a microscope. With early detection, treatment, and close follow-up care, nearly all cervical dysplasia can be cured. If untreated, the mild to moderate stages of dysplasia often grow more severe.  RISK FACTORS  The following increase the risk for cervical dysplasia.  Having had a sexually transmitted disease, including:  Chlamydia.  Human papilloma virus (HPV).  Becoming sexually active before age 18.  Having had more than 1 sexual partner.  Not using protection, such as condoms, during sexual intercourse, especially with new sexual partners.  Having had cancer of the vagina or vulva.  Having a sexual partner whose previous partner had cancer of the cervix or cervical dysplasia.  Having a sexual partner who has or has had cancer of the penis.  Having a weakened immune system (HIV, organ transplant).  Being the daughter of a woman who took DES (diethylstilbestrol) during pregnancy.  A history of cervical cancer in a woman's sister or mother.  Smoking.  Having had an abnormal Pap test in the past. SYMPTOMS  There are usually no symptoms. If there are symptoms, they may be vague such as:  Abnormal vaginal discharge.  Bleeding between periods or following intercourse.  Bleeding during menopause.  Pain on intercourse (dyspareunia). DIAGNOSIS   The Pap test is the best way of detecting abnormalities of the cervix.  Biopsy (removing a piece of tissue to look at under the microscope) of the cervix when the Pap test is abnormal or when the Pap test is normal, but the cervix looks abnormal. TREATMENT    Catching and treating the changes early with Pap tests can prevent cervical cancer.  Cryotherapy freezes the abnormal cells with a steel tip instrument.  A laser can be used to remove the abnormal cells.  Loop electrocautery excision procedure (LEEP). This procedure uses a heated electrical loop to remove a cone-like portion of the cervix, including the cervical canal.  For more serious cases of cervical dysplasia, the abnormal tissue may be removed surgically by:  A cone biopsy (by cold knife, laser or LEEP). A procedure in which a portion of the center of the cervix with the cervical canal is removed.  The uterus and cervix are removed (hysterectomy). Your caregiver will advise you regarding the need and timing of Pap tests in your follow-up. Women who have been treated for dysplasia should be closely followed with pelvic exams and Pap tests. During the first year following treatment of cervical dysplasia, Pap tests should be done every 3 to 4 months. In the second year, the schedule is every 6 months, or as recommended by your caregiver. See your caregiver for new or worsening problems. HOME CARE INSTRUCTIONS   Follow the instructions and recommendations of your caregiver regarding medicines and follow-up appointments.  Only take over-the-counter or prescription medicines for pain or discomfort as directed by your caregiver.  Cramping and pelvic discomfort may follow cryotherapy. It is not abnormal to have watery discharge for several weeks after.  Laser, cone surgery, cryotherapy or LEEP can cause a bad smelling vaginal discharge. It may also cause vaginal bleeding for a couple weeks following the procedure. The   discharge may be black from the paste used to control bleeding from the cone site. This is normal.  Do not use tampons, have sexual intercourse or douche until your caregiver says it is okay. SEEK MEDICAL CARE IF:   You develop genital warts.  You need a prescription for  pain medicine following your treatment. SEEK IMMEDIATE MEDICAL CARE IF:   Your bleeding is heavier than a normal menstrual period.  You develop bright red bleeding, especially if you have blood clots.  You have a fever.  You have increasing cramps or pain not relieved with medicine.  You are lightheaded, unusually weak, or have fainting spells.  You have abnormal vaginal discharge.  You develop abdominal pain. PREVENTION   The surest way to prevent cervical dysplasia is to abstain from sexual intercourse.  Practice safe sex, use condoms and have only one sex partner who does not have other sex partners.  A Pap test is done to screen for cervical cancer.  The first Pap test should be done at age 21.  Between ages 21 and 29, Pap tests are repeated every 2 years.  Beginning at age 30, you are advised to have a Pap test every 3 years as long as your past 3 Pap tests have been normal.  Some women have medical problems that increase the chance of getting cervical cancer. Talk to your caregiver about these problems. It is especially important to talk to your caregiver if a new problem develops soon after your last Pap test. In these cases, your caregiver may recommend more frequent screening and Pap tests.  The above recommendations are the same for women who have or have not gotten the vaccine for HPV (Human Papillomavirus).  If you had a hysterectomy for a problem that was not a cancer or a condition that could lead to cancer, then you no longer need Pap tests. However, even if you no longer need a Pap test, a regular exam is a good idea to make sure no other problems are starting.   If you are between ages 65 and 70, and you have had normal Pap tests going back 10 years, you no longer need Pap tests. However, even if you no longer need a Pap test, a regular exam is a good idea to make sure no other problems are starting.   If you have had past treatment for cervical cancer or a  condition that could lead to cancer, you need Pap tests and screening for cancer for at least 20 years after your treatment.  If Pap tests have been discontinued, risk factors (such as a new sexual partner) need to be re-assessed to determine if screening should be resumed.  Some women may need screenings more often if they are at high risk for cervical cancer.  Your caregiver may do additional tests including:  Colposcopy. A procedure in which a special microscope magnifies the cells and allows the provider to closely examine the cervix, vagina, and vulva.  Biopsy. A small tissue sample is taken from the cervix, vagina or vulva. This is generally done in your caregivers office.  A cone biopsy (cold knife or laser). A large tissue sample is taken from the cervix. This procedure is usually done in an operating room under a general anesthetic. The cone often removes all abnormal tissue and so may also complete the treatment.  LEEP, also removing a circular portion of the cervix and is done in a doctors office under a local anesthetic.  Now   there is a vaccine, Gardasil, that was developed to prevent the HPV'S that can cause cancer of the cervix and genital warts. It is recommended for females ages 9 to 26. It should not be given to pregnant women until more is known about its effects on the fetus. Not all cancers of the cervix are caused by the HPV. Routine gynecology exams and Pap tests should continue as recommended by your caregiver. Document Released: 04/05/2005 Document Revised: 06/28/2011 Document Reviewed: 03/27/2008 [employer] Patient Information 2013 [employer].  

## 2012-08-02 NOTE — Progress Notes (Signed)
Patient ID: Isabel Evans, female   DOB: 02-28-1971, 42 y.o.   MRN: [id number] Patient given informed consent, signed copy in the chart, time out was performed.  Placed in lithotomy position. Cervix viewed with speculum and colposcope after application of acetic acid.  PAP:07/13/2012   Diagnosis: LOW GRADE SQUAMOUS INTRAEPITHELIAL LESION: CIN-1/ HPV (LSIL). Colposcopy adequate?  yes Acetowhite lesions?no Punctation?no Mosaicism? no Abnormal vasculature? Questionable coarse vessel at 12 o'clock Biopsies?x1 @12o 'clock ECC?no  Patient was given post procedure instructions.  She will return in 3 months for re eval of her DUB I reviewed with her the previous results from tests 07/13/2012   Eber Jones L. Harraway-Smith, M.D., [facility]

## 2012-08-10 ENCOUNTER — Encounter: Payer: Self-pay | Admitting: *Deleted

## 2012-08-10 ENCOUNTER — Telehealth: Payer: Self-pay | Admitting: *Deleted

## 2012-08-10 NOTE — Telephone Encounter (Signed)
Called [facility] to notify of results and need for pap in one year. Stressed importance of pap in one year. Patient voices understanding

## 2012-08-10 NOTE — Telephone Encounter (Signed)
Message copied by Gerome Apley on Thu Aug 10, 2012  1:26 PM ------      Message from: Willodean Rosenthal      Created: Tue Aug 08, 2012  8:42 AM       Please notfiy pt of Low grade dysplasia on colpo biopsies.  She needs a repeat PAP in 1 year.            Thx,      clh-S ------

## 2012-08-14 ENCOUNTER — Ambulatory Visit: Payer: No Typology Code available for payment source | Admitting: Obstetrics & Gynecology

## 2012-09-08 ENCOUNTER — Telehealth: Payer: Self-pay | Admitting: Family Medicine

## 2012-09-08 ENCOUNTER — Telehealth: Payer: Self-pay | Admitting: *Deleted

## 2012-09-08 ENCOUNTER — Ambulatory Visit: Payer: No Typology Code available for payment source | Attending: Family Medicine | Admitting: Family Medicine

## 2012-09-08 VITALS — BP 125/86 | HR 95 | Temp 98.5°F | Resp 18 | Wt 177.2 lb

## 2012-09-08 DIAGNOSIS — M1990 Unspecified osteoarthritis, unspecified site: Secondary | ICD-10-CM

## 2012-09-08 DIAGNOSIS — M2550 Pain in unspecified joint: Secondary | ICD-10-CM

## 2012-09-08 DIAGNOSIS — R5383 Other fatigue: Secondary | ICD-10-CM | POA: Insufficient documentation

## 2012-09-08 DIAGNOSIS — R600 Localized edema: Secondary | ICD-10-CM

## 2012-09-08 DIAGNOSIS — M159 Polyosteoarthritis, unspecified: Secondary | ICD-10-CM

## 2012-09-08 DIAGNOSIS — R5381 Other malaise: Secondary | ICD-10-CM | POA: Insufficient documentation

## 2012-09-08 DIAGNOSIS — IMO0001 Reserved for inherently not codable concepts without codable children: Secondary | ICD-10-CM

## 2012-09-08 DIAGNOSIS — M62838 Other muscle spasm: Secondary | ICD-10-CM

## 2012-09-08 DIAGNOSIS — M797 Fibromyalgia: Secondary | ICD-10-CM

## 2012-09-08 DIAGNOSIS — I10 Essential (primary) hypertension: Secondary | ICD-10-CM | POA: Insufficient documentation

## 2012-09-08 DIAGNOSIS — M151 Heberden's nodes (with arthropathy): Secondary | ICD-10-CM

## 2012-09-08 DIAGNOSIS — R609 Edema, unspecified: Secondary | ICD-10-CM

## 2012-09-08 LAB — COMPREHENSIVE METABOLIC PANEL
ALT: 14 U/L (ref 0–35)
AST: 19 U/L (ref 0–37)
CO2: 26 mEq/L (ref 19–32)
Calcium: 9.6 mg/dL (ref 8.4–10.5)
Chloride: 105 mEq/L (ref 96–112)
Creat: 0.77 mg/dL (ref 0.50–1.10)
Potassium: 4.1 mEq/L (ref 3.5–5.3)
Sodium: 137 mEq/L (ref 135–145)
Total Protein: 7.4 g/dL (ref 6.0–8.3)

## 2012-09-08 LAB — CBC
HCT: 41.4 % (ref 36.0–46.0)
MCHC: 35.3 g/dL (ref 30.0–36.0)
Platelets: 281 10*3/uL (ref 150–400)
RDW: 13.9 % (ref 11.5–15.5)
WBC: 7.9 10*3/uL (ref 4.0–10.5)

## 2012-09-08 LAB — CK: Total CK: 148 U/L (ref 7–177)

## 2012-09-08 MED ORDER — CYCLOBENZAPRINE HCL 5 MG PO TABS: 5.0000 mg | ORAL_TABLET | Freq: Three times a day (TID) | ORAL | Status: DC | PRN

## 2012-09-08 NOTE — Progress Notes (Signed)
Patient ID: Isabel Evans, female   DOB: [birth year], 42 y.o.   MRN: [id number]  CC: Fatigue  HPI: The patient is presenting today with multiple complaints the patient reports she's been having fatigue that has not changed or getting worse.  She reports that she's also having some symptoms of edema in both legs that has been associated with her taking the amlodipine.  She reports that she otherwise tolerates the amlodipine very well and her blood pressure is very well-controlled.  She reports that she's having constant pain with the muscles of her neck and back and aching in the muscles all over her body.  No Known Allergies Past Medical History  Diagnosis Date  . GERD (gastroesophageal reflux disease)   . Hypertension    Current Outpatient Prescriptions on File Prior to Visit  Medication Sig Dispense Refill  . amitriptyline (ELAVIL) 50 MG tablet Then take 1 po every 12 hours  60 tablet  2  . amLODipine (NORVASC) 10 MG tablet Take 1 tablet (10 mg total) by mouth daily.  60 tablet  1  . loratadine (CLARITIN) 10 MG tablet Take 1 tablet (10 mg total) by mouth daily.  30 tablet  2  . megestrol (MEGACE) 20 MG tablet Take 2 tablets (40 mg total) by mouth daily.  30 tablet  3  . mometasone (NASONEX) 50 MCG/ACT nasal spray Place 1 spray into the nose daily.  17 g  2  . omeprazole (PRILOSEC) 20 MG capsule Take 1 capsule (20 mg total) by mouth daily.  60 capsule  2  . Salicylic Acid 40 % MISC The plaster is best applied to the wart and a few millimeters of surrounding skin, taped into place with duct or athletic tape and kept dry for 48 to 72 hours. The patch is then removed, the wart pared down, and the process repeated. The patch must be taped securely in place because it destroys all skin it contacts. If it gets wet, it must be reapplied.  12 each  0  . [DISCONTINUED] fluticasone (FLONASE) 50 MCG/ACT nasal spray Place 2 sprays into the nose daily.  16 g  2   No current facility-administered  medications on file prior to visit.   Family History  Problem Relation Age of Onset  . Hypertension Mother   . Diabetes Mother   . Heart disease Mother   . Hypertension Father   . Diabetes Father   . Cancer Sister   . Hypertension Sister   . Diabetes Sister   . Heart disease Maternal Grandmother    History   Social History  . Marital Status: Single    Spouse Name: N/A    Number of Children: N/A  . Years of Education: N/A   Occupational History  . Not on file.   Social History Main Topics  . Smoking status: Former Smoker    Types: Cigarettes  . Smokeless tobacco: Current User  . Alcohol Use: No  . Drug Use: No  . Sexually Active: Yes    Birth Control/ Protection: Surgical   Other Topics Concern  . Not on file   Social History Narrative  . No narrative on file    Review of Systems  Constitutional: Negative for fever, chills, diaphoresis, activity change, appetite change and fatigue.  HENT: Negative for ear pain, nosebleeds, congestion, facial swelling, rhinorrhea, neck pain, neck stiffness and ear discharge.   Eyes: Negative for pain, discharge, redness, itching and visual disturbance.  Respiratory: Negative for cough, choking, chest tightness,  shortness of breath, wheezing and stridor.   Cardiovascular: Negative for chest pain, palpitations and leg swelling.  Gastrointestinal: Negative for abdominal distention.  Genitourinary: Negative for dysuria, urgency, frequency, hematuria, flank pain, decreased urine volume, difficulty urinating and dyspareunia.  Musculoskeletal: pain in muscles and joints  Neurological: Negative for dizziness, tremors, seizures, syncope, facial asymmetry, speech difficulty, weakness, light-headedness, numbness and headaches.  Hematological: Negative for adenopathy. Does not bruise/bleed easily.  Psychiatric/Behavioral: Negative for hallucinations, behavioral problems, confusion, dysphoric mood, decreased concentration and agitation.     Objective:   Filed Vitals:   09/08/12 1324  BP: 125/86  Pulse: 95  Temp: 98.5 F (36.9 C)  Resp: 18    Physical Exam  Constitutional: Appears well-developed and well-nourished. No distress.  HENT: Normocephalic. External right and left ear normal. Oropharynx is clear and moist.  Eyes: Conjunctivae and EOM are normal. PERRLA, no scleral icterus.  Neck: Normal ROM. Neck supple. No JVD. No tracheal deviation. No thyromegaly.  CVS: RRR, S1/S2 +, no murmurs, no gallops, no carotid bruit.  Pulmonary: Effort and breath sounds normal, no stridor, rhonchi, wheezes, rales.  Abdominal: Soft. BS +,  no distension, tenderness, rebound or guarding.  Musculoskeletal: Normal range of motion. Heberden's nodes on fingers of both hands. No edema and no tenderness.  Lymphadenopathy: No lymphadenopathy noted, cervical, inguinal. Neuro: Alert. Normal reflexes, muscle tone coordination. No cranial nerve deficit. Skin: Skin is warm and dry. No rash noted. Not diaphoretic. No erythema. No pallor.  Psychiatric: Normal mood and affect. Behavior, judgment, thought content normal.   Lab Results  Component Value Date   WBC 6.6 07/13/2012   HGB 14.4 07/13/2012   HCT 41.0 07/13/2012   MCV 91.3 07/13/2012   PLT 288 07/13/2012   No results found for this basename: CREATININE, BUN, NA, K, CL, CO2    No results found for this basename: HGBA1C   Lipid Panel  No results found for this basename: chol, trig, hdl, cholhdl, vldl, ldlcalc     Assessment and plan:   Patient Active Problem List   Diagnosis Date Noted  . Fibromyalgia 09/08/2012  . Edema of both legs 09/08/2012  . Osteoarthritis 09/08/2012  . Hypertension 07/14/2012  . Muscle spasm 07/14/2012  . Neck muscle spasm 07/14/2012  . Plantar wart 07/14/2012  . Insomnia 07/14/2012   I believe the patient has fibromyalgia.  I would like for her to continue taking the amitriptyline 50 mg twice a day.  In addition, will check a CPK level and a  sedimentation rate ANA.  I like to have her to see a rheumatologist to evaluate her arthritis condition that she's been suffering from with her hands.  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     Will follow up on her lab results.   Rodney Langton, MD, CDE, FAAFP [facility] [place], [place]

## 2012-09-08 NOTE — Progress Notes (Signed)
Patient complains of neck and shoulder pain Fatigued States she gets edema in her legs later in the day Has been having short term memory

## 2012-09-08 NOTE — Patient Instructions (Signed)
Edema Edema is a buildup of fluids. It is most common in the feet, ankles, and legs. This happens more as a person ages. It may affect one or both legs. HOME CARE   Raise (elevate) the legs or ankles above the level of the heart while lying down.  Avoid sitting or standing still for a long time.  Exercise the legs to help the puffiness (swelling) go down.  A low-salt diet may help lessen the puffiness.  Only take medicine as told by your doctor. GET HELP RIGHT AWAY IF:   You develop shortness of breath or chest pain.  You cannot breathe when you lie down.  You have more puffiness that does not go away with treatment.  You develop pain or redness in the areas that are puffy.  You have a temperature by mouth above 102 F (38.9 C), not controlled by medicine.  You gain 3 lb/1.4 kg or more in 1 day or 5 lb/2.3 kg in a week. MAKE SURE YOU:   Understand these instructions.  Will watch your condition.  Will get help right away if you are not doing well or get worse. Document Released: 09/22/2007 Document Revised: 06/28/2011 Document Reviewed: 09/22/2007 [facility] Patient Information 2014 [place], [place]. Myalgia, Adult Myalgia is the medical term for muscle pain. It is a symptom of many things. Nearly everyone at some time in their life has this. The most common cause for muscle pain is overuse or straining and more so when you are not in shape. Injuries and muscle bruises cause myalgias. Muscle pain without a history of injury can also be caused by a virus. It frequently comes along with the flu. Myalgia not caused by muscle strain can be present in a large number of infectious diseases. Some autoimmune diseases like lupus and fibromyalgia can cause muscle pain. Myalgia may be mild, or severe. SYMPTOMS  The symptoms of myalgia are simply muscle pain. Most of the time this is short lived and the pain goes away without treatment. DIAGNOSIS  Myalgia is diagnosed by your caregiver by  taking your history. This means you tell him when the problems began, what they are, and what has been happening. If this has not been a long term problem, your caregiver may want to watch for a while to see what will happen. If it has been long term, they may want to do additional testing. TREATMENT  The treatment depends on what the underlying cause of the muscle pain is. Often anti-inflammatory medications will help. HOME CARE INSTRUCTIONS  If the pain in your muscles came from overuse, slow down your activities until the problems go away.  Myalgia from overuse of a muscle can be treated with alternating hot and cold packs on the muscle affected or with cold for the first couple days. If either heat or cold seems to make things worse, stop their use.  Apply ice to the sore area for 15-20 minutes, 3-4 times per day, while awake for the first 2 days of muscle soreness, or as directed. Put the ice in a plastic bag and place a towel between the bag of ice and your skin.  Only take over-the-counter or prescription medicines for pain, discomfort, or fever as directed by your caregiver.  Regular gentle exercise may help if you are not active.  Stretching before strenuous exercise can help lower the risk of myalgia. It is normal when beginning an exercise regimen to feel some muscle pain after exercising. Muscles that have not been  used frequently will be sore at first. If the pain is extreme, this may mean injury to a muscle. SEEK MEDICAL CARE IF:  You have an increase in muscle pain that is not relieved with medication.  You begin to run a temperature.  You develop nausea and vomiting.  You develop a stiff and painful neck.  You develop a rash.  You develop muscle pain after a tick bite.  You have continued muscle pain while working out even after you are in good condition. SEEK IMMEDIATE MEDICAL CARE IF: Any of your problems are getting worse and medications are not helping. MAKE SURE  YOU:   Understand these instructions.  Will watch your condition.  Will get help right away if you are not doing well or get worse. Document Released: 02/25/2006 Document Revised: 06/28/2011 Document Reviewed: 05/17/2006 [facility] Patient Information 2014 [place], [place]. Fibromyalgia Fibromyalgia is a disorder that is often misunderstood. It is associated with muscular pains and tenderness that comes and goes. It is often associated with fatigue and sleep disturbances. Though it tends to be long-lasting, fibromyalgia is not life-threatening. CAUSES  The exact cause of fibromyalgia is unknown. People with certain gene types are predisposed to developing fibromyalgia and other conditions. Certain factors can play a role as triggers, such as:  Spine disorders.  Arthritis.  Severe injury (trauma) and other physical stressors.  Emotional stressors. SYMPTOMS   The main symptom is pain and stiffness in the muscles and joints, which can vary over time.  Sleep and fatigue problems. Other related symptoms may include:  Bowel and bladder problems.  Headaches.  Visual problems.  Problems with odors and noises.  Depression or mood changes.  Painful periods (dysmenorrhea).  Dryness of the skin or eyes. DIAGNOSIS  There are no specific tests for diagnosing fibromyalgia. Patients can be diagnosed accurately from the specific symptoms they have. The diagnosis is made by determining that nothing else is causing the problems. TREATMENT  There is no cure. Management includes medicines and an active, healthy lifestyle. The goal is to enhance physical fitness, decrease pain, and improve sleep. HOME CARE INSTRUCTIONS   Only take over-the-counter or prescription medicines as directed by your caregiver. Sleeping pills, tranquilizers, and pain medicines may make your problems worse.  Low-impact aerobic exercise is very important and advised for treatment. At first, it may seem to make pain  worse. Gradually increasing your tolerance will overcome this feeling.  Learning relaxation techniques and how to control stress will help you. Biofeedback, visual imagery, hypnosis, muscle relaxation, yoga, and meditation are all options.  Anti-inflammatory medicines and physical therapy may provide short-term help.  Acupuncture or massage treatments may help.  Take muscle relaxant medicines as suggested by your caregiver.  Avoid stressful situations.  Plan a healthy lifestyle. This includes your diet, sleep, rest, exercise, and friends.  Find and practice a hobby you enjoy.  Join a fibromyalgia support group for interaction, ideas, and sharing advice. This may be helpful. SEEK MEDICAL CARE IF:  You are not having good results or improvement from your treatment. FOR MORE INFORMATION  National Fibromyalgia Association: www.fmaware.org Arthritis Foundation: www.arthritis.org Document Released: 04/05/2005 Document Revised: 06/28/2011 Document Reviewed: 07/16/2009 [facility] Patient Information 2014 [place], [place].

## 2012-09-08 NOTE — Telephone Encounter (Signed)
Patient left a message stating that she had started her period on May 6 and was still bleeding.

## 2012-09-12 ENCOUNTER — Telehealth: Payer: Self-pay | Admitting: Family Medicine

## 2012-09-12 LAB — ANA: Anti Nuclear Antibody(ANA): NEGATIVE

## 2012-09-12 NOTE — Telephone Encounter (Signed)
09/12/12 Spoke with patient no lab results are  Back . Patient was informed that MD must review results once they come back  And make any necessary recommendations. P.Bob Daversa,RN

## 2012-09-13 ENCOUNTER — Telehealth: Payer: Self-pay | Admitting: *Deleted

## 2012-09-13 NOTE — Telephone Encounter (Signed)
Spoke to patient and states that she has spoken to a nurse and her concern have resolved. Patient states no need for further assistance.

## 2012-09-13 NOTE — Progress Notes (Signed)
Quick Note:  Please inform patient that her labs came back within normal limits.   Rodney Langton, MD, CDE, FAAFP [facility] [place], [place]   ______

## 2012-09-13 NOTE — Telephone Encounter (Signed)
09/13/12 Attempted to reach patient was unavailable to receive  Lab result. P.Gaylynn Seiple,RN

## 2012-09-15 ENCOUNTER — Telehealth: Payer: Self-pay | Admitting: *Deleted

## 2012-09-15 NOTE — Telephone Encounter (Signed)
09/15/12 Patient made aware that labs  Results are WNL per Dr. Laural Benes. P.Alisa Stjames,RN

## 2012-09-18 ENCOUNTER — Telehealth: Payer: Self-pay | Admitting: *Deleted

## 2012-09-18 NOTE — Progress Notes (Signed)
09/18/12 Patient informed that labs are WNL per Dr. Laural Benes. P.Kazue Cerro,RN

## 2012-09-19 ENCOUNTER — Telehealth: Payer: Self-pay | Admitting: Family Medicine

## 2012-09-19 NOTE — Telephone Encounter (Signed)
09/18/12 Patient made aware that lab results  Done on 09/08/12  WNL per Dr. Laural Benes. P.Izmael Duross,RN BSN [facility]

## 2012-09-24 ENCOUNTER — Encounter: Payer: Self-pay | Admitting: Family Medicine

## 2012-10-11 ENCOUNTER — Emergency Department (HOSPITAL_COMMUNITY): Payer: Self-pay

## 2012-10-11 ENCOUNTER — Encounter (HOSPITAL_COMMUNITY): Payer: Self-pay | Admitting: Emergency Medicine

## 2012-10-11 ENCOUNTER — Emergency Department (HOSPITAL_COMMUNITY)
Admission: EM | Admit: 2012-10-11 | Discharge: 2012-10-11 | Disposition: A | Discharge status: Home / Self Care | Payer: Self-pay | Attending: Emergency Medicine | Admitting: Emergency Medicine

## 2012-10-11 DIAGNOSIS — Z87891 Personal history of nicotine dependence: Secondary | ICD-10-CM | POA: Insufficient documentation

## 2012-10-11 DIAGNOSIS — Z3202 Encounter for pregnancy test, result negative: Secondary | ICD-10-CM | POA: Insufficient documentation

## 2012-10-11 DIAGNOSIS — K219 Gastro-esophageal reflux disease without esophagitis: Secondary | ICD-10-CM | POA: Insufficient documentation

## 2012-10-11 DIAGNOSIS — Z9889 Other specified postprocedural states: Secondary | ICD-10-CM | POA: Insufficient documentation

## 2012-10-11 DIAGNOSIS — R1011 Right upper quadrant pain: Secondary | ICD-10-CM | POA: Insufficient documentation

## 2012-10-11 DIAGNOSIS — Z79899 Other long term (current) drug therapy: Secondary | ICD-10-CM | POA: Insufficient documentation

## 2012-10-11 DIAGNOSIS — R110 Nausea: Secondary | ICD-10-CM | POA: Insufficient documentation

## 2012-10-11 DIAGNOSIS — Z9851 Tubal ligation status: Secondary | ICD-10-CM | POA: Insufficient documentation

## 2012-10-11 DIAGNOSIS — R109 Unspecified abdominal pain: Secondary | ICD-10-CM

## 2012-10-11 DIAGNOSIS — I10 Essential (primary) hypertension: Secondary | ICD-10-CM | POA: Insufficient documentation

## 2012-10-11 LAB — COMPREHENSIVE METABOLIC PANEL
AST: 15 U/L (ref 0–37)
Albumin: 3.7 g/dL (ref 3.5–5.2)
BUN: 5 mg/dL — ABNORMAL LOW (ref 6–23)
Creatinine, Ser: 0.68 mg/dL (ref 0.50–1.10)
Total Protein: 7 g/dL (ref 6.0–8.3)

## 2012-10-11 LAB — CBC WITH DIFFERENTIAL/PLATELET
Basophils Absolute: 0 10*3/uL (ref 0.0–0.1)
Basophils Relative: 1 % (ref 0–1)
Eosinophils Absolute: 0.2 10*3/uL (ref 0.0–0.7)
HCT: 38.6 % (ref 36.0–46.0)
Hemoglobin: 13.7 g/dL (ref 12.0–15.0)
MCH: 32.1 pg (ref 26.0–34.0)
MCHC: 35.5 g/dL (ref 30.0–36.0)
Monocytes Absolute: 0.4 10*3/uL (ref 0.1–1.0)
Monocytes Relative: 7 % (ref 3–12)
Neutrophils Relative %: 58 % (ref 43–77)
RDW: 13.2 % (ref 11.5–15.5)

## 2012-10-11 LAB — URINALYSIS, ROUTINE W REFLEX MICROSCOPIC
Leukocytes, UA: NEGATIVE
Nitrite: NEGATIVE
Specific Gravity, Urine: 1.011 (ref 1.005–1.030)
pH: 7.5 (ref 5.0–8.0)

## 2012-10-11 LAB — PREGNANCY, URINE: Preg Test, Ur: NEGATIVE

## 2012-10-11 LAB — POCT I-STAT TROPONIN I: Troponin i, poc: 0 ng/mL (ref 0.00–0.08)

## 2012-10-11 LAB — LIPASE, BLOOD: Lipase: 28 U/L (ref 11–59)

## 2012-10-11 MED ORDER — HYDROCODONE-ACETAMINOPHEN 5-325 MG PO TABS: 1.0000 | ORAL_TABLET | Freq: Four times a day (QID) | ORAL | Status: DC | PRN

## 2012-10-11 MED ORDER — MORPHINE SULFATE 4 MG/ML IJ SOLN
4.0000 mg | Freq: Once | INTRAMUSCULAR | Status: AC
  Administered 2012-10-11: 4 mg via INTRAVENOUS
  Filled 2012-10-11: qty 1

## 2012-10-11 MED ORDER — PROMETHAZINE HCL 25 MG PO TABS: 25.0000 mg | ORAL_TABLET | Freq: Four times a day (QID) | ORAL | Status: DC | PRN

## 2012-10-11 MED ORDER — ONDANSETRON HCL 4 MG/2ML IJ SOLN
4.0000 mg | Freq: Once | INTRAMUSCULAR | Status: AC
  Administered 2012-10-11: 4 mg via INTRAVENOUS
  Filled 2012-10-11: qty 2

## 2012-10-11 NOTE — Progress Notes (Signed)
[facility] has seen patient. Patient stated that she had the [facility] Card up until 08/29/12. Provided her with a oc application, so that she can reapply.

## 2012-10-11 NOTE — ED Provider Notes (Signed)
History    CSN: [id number] Arrival date & time 10/11/12  1403  First MD Initiated Contact with Patient 10/11/12 1443     Chief Complaint  Patient presents with  . RUQ pain    (Consider location/radiation/quality/duration/timing/severity/associated sxs/prior Treatment) HPI Comments: Patient presents with a chief complaint of RUQ abdominal pain.  Pain has been intermittent over the past 2 weeks.  She reports that the pain is typically brought on after eating.  She reports that the pain radiates to the back.  She reports that it feels like a stabbing pain.  She also reports that the pain is associated with some nausea, but no vomiting.  Denies diarrhea or constipation.  She denies cough, fever, chills, chest pain, or SOB.  She denies prior history of Gallstones.    The history is provided by the patient.   Past Medical History  Diagnosis Date  . GERD (gastroesophageal reflux disease)   . Hypertension    Past Surgical History  Procedure Laterality Date  . Leg surgery    . Tubal ligation    . Abdominal surgery     Family History  Problem Relation Age of Onset  . Hypertension Mother   . Diabetes Mother   . Heart disease Mother   . Hypertension Father   . Diabetes Father   . Cancer Sister   . Hypertension Sister   . Diabetes Sister   . Heart disease Maternal Grandmother    History  Substance Use Topics  . Smoking status: Former Smoker    Types: Cigarettes  . Smokeless tobacco: Current User  . Alcohol Use: No   OB History   Grav Para Term Preterm Abortions TAB SAB Ect Mult Living   3 3 3  0 0 0 0 0 0 3     Review of Systems  Constitutional: Negative for fever and chills.  Gastrointestinal: Positive for nausea and abdominal pain.  All other systems reviewed and are negative.    Allergies  Review of patient's allergies indicates no known allergies.  Home Medications   Current Outpatient Rx  Name  Route  Sig  Dispense  Refill  . amitriptyline (ELAVIL) 50 MG  tablet   Oral   Take 50 mg by mouth 2 (two) times daily.         Marland Kitchen amLODipine (NORVASC) 10 MG tablet   Oral   Take 1 tablet (10 mg total) by mouth daily.   60 tablet   1   . ibuprofen (ADVIL,MOTRIN) 200 MG tablet   Oral   Take 800 mg by mouth every 8 (eight) hours as needed for pain.         Marland Kitchen loratadine (CLARITIN) 10 MG tablet   Oral   Take 10 mg by mouth daily as needed for allergies.         Marland Kitchen omeprazole (PRILOSEC) 20 MG capsule   Oral   Take 1 capsule (20 mg total) by mouth daily.   60 capsule   2   . sodium chloride (OCEAN) 0.65 % nasal spray   Nasal   Place 1 spray into the nose as needed for congestion.          BP 124/83  Pulse 99  Temp(Src) 98.4 F (36.9 C) (Oral)  Resp 18  SpO2 100%  LMP 09/13/2012 Physical Exam  Nursing note and vitals reviewed. Constitutional: She appears well-developed and well-nourished.  HENT:  Head: Normocephalic and atraumatic.  Mouth/Throat: Oropharynx is clear and moist.  Cardiovascular: Normal  rate, regular rhythm and normal heart sounds.   Pulmonary/Chest: Effort normal and breath sounds normal. No respiratory distress. She has no wheezes. She has no rales.  Abdominal: Soft. Normal appearance and bowel sounds are normal. There is tenderness in the right upper quadrant. There is positive Murphy's sign. There is no rigidity, no rebound and no guarding.  Musculoskeletal: Normal range of motion.  Neurological: She is alert.  Skin: Skin is warm and dry.  Psychiatric: She has a normal mood and affect.    ED Course  Procedures (including critical care time) Labs Reviewed  CBC WITH DIFFERENTIAL  COMPREHENSIVE METABOLIC PANEL  LIPASE, BLOOD   US Abdomen Complete  10/11/2012   *RADIOLOGY REPORT*  Clinical Data:  42 year old female right upper quadrant pain.  COMPLETE ABDOMINAL ULTRASOUND  Comparison:  Pelvis ultrasound 07/18/2012.  Findings:  Gallbladder:  Decompressed.  No sludge.  No definite shadowing echogenic stones  (suspect duodenal bowel gas shadowing.  Wall thickness within normal limits up to 3 mm.  No sonographic Murphy's sign elicited.  No pericholecystic fluid.  Common bile duct:  Normal, 4 mm diameter.  Liver:  No focal lesion identified.  Within normal limits in parenchymal echogenicity.  IVC:  Appears normal.  Pancreas:  No focal abnormality seen.  Spleen:  Normal measuring 7.5 cm in length.  Right Kidney:  Normal measuring 12.0 cm in length.  Left Kidney:  Normal measuring 11.2 cm in length.  Abdominal aorta:  No aneurysm identified.  IMPRESSION: No evidence of acute cholecystitis.  Negative abdominal ultrasound.   Original Report Authenticated By: Erskine Speed, M.D.   No diagnosis found.   Date: 10/11/2012  Rate: 82  Rhythm: normal sinus rhythm  QRS Axis: normal  Intervals: normal  ST/T Wave abnormalities: normal  Conduction Disutrbances:none  Narrative Interpretation:   Old EKG Reviewed: none available   MDM  Patient presenting with RUQ pain that has been present intermittently for the past 2 weeks and is brought on by eating.  Labs unremarkable.  Patient afebrile.  Abdominal ultrasound showing no definite Gallstones and no evidence of Cholecystitis.  Pain and nausea improved while in the ED.  Feel that the patient is stable for discharge.  Patient given referral to GI.  Return precautions discussed.  Pascal Lux [place], PA-C 10/11/12 2254  Pascal Lux [place], PA-C 10/11/12 [phone]

## 2012-10-11 NOTE — ED Notes (Signed)
Per patient, RUQ pain on and off for 2 weeks-increased pain on Sunday-states radiates to back-nausea

## 2012-10-12 NOTE — ED Provider Notes (Signed)
Medical screening examination/treatment/procedure(s) were performed by non-physician practitioner and as supervising physician I was immediately available for consultation/collaboration.   Benny Lennert, MD 10/12/12 [phone]

## 2012-10-26 ENCOUNTER — Ambulatory Visit: Payer: No Typology Code available for payment source | Attending: Family Medicine | Admitting: Family Medicine

## 2012-10-26 VITALS — BP 145/94 | HR 97 | Temp 97.8°F | Resp 18 | Wt 186.0 lb

## 2012-10-26 DIAGNOSIS — M1990 Unspecified osteoarthritis, unspecified site: Secondary | ICD-10-CM

## 2012-10-26 DIAGNOSIS — I10 Essential (primary) hypertension: Secondary | ICD-10-CM

## 2012-10-26 DIAGNOSIS — M797 Fibromyalgia: Secondary | ICD-10-CM

## 2012-10-26 DIAGNOSIS — M62838 Other muscle spasm: Secondary | ICD-10-CM

## 2012-10-26 DIAGNOSIS — G4700 Insomnia, unspecified: Secondary | ICD-10-CM

## 2012-10-26 DIAGNOSIS — IMO0001 Reserved for inherently not codable concepts without codable children: Secondary | ICD-10-CM | POA: Insufficient documentation

## 2012-10-26 MED ORDER — AMLODIPINE BESYLATE 10 MG PO TABS: 10.0000 mg | ORAL_TABLET | Freq: Every day | ORAL | Status: DC

## 2012-10-26 MED ORDER — AMITRIPTYLINE HCL 100 MG PO TABS: 100.0000 mg | ORAL_TABLET | Freq: Two times a day (BID) | ORAL | Status: DC

## 2012-10-26 MED ORDER — OMEPRAZOLE 20 MG PO CPDR: 20.0000 mg | DELAYED_RELEASE_CAPSULE | Freq: Every day | ORAL | Status: DC

## 2012-10-26 MED ORDER — CYCLOBENZAPRINE HCL 5 MG PO TABS: 5.0000 mg | ORAL_TABLET | Freq: Every evening | ORAL | Status: DC | PRN

## 2012-10-26 NOTE — Progress Notes (Signed)
Patient here for medication refills Would like to discuss her visit with rhuematology

## 2012-10-26 NOTE — Patient Instructions (Addendum)
Fibromyalgia Fibromyalgia is a disorder that is often misunderstood. It is associated with muscular pains and tenderness that comes and goes. It is often associated with fatigue and sleep disturbances. Though it tends to be long-lasting, fibromyalgia is not life-threatening. CAUSES  The exact cause of fibromyalgia is unknown. People with certain gene types are predisposed to developing fibromyalgia and other conditions. Certain factors can play a role as triggers, such as:  Spine disorders.  Arthritis.  Severe injury (trauma) and other physical stressors.  Emotional stressors. SYMPTOMS   The main symptom is pain and stiffness in the muscles and joints, which can vary over time.  Sleep and fatigue problems. Other related symptoms may include:  Bowel and bladder problems.  Headaches.  Visual problems.  Problems with odors and noises.  Depression or mood changes.  Painful periods (dysmenorrhea).  Dryness of the skin or eyes. DIAGNOSIS  There are no specific tests for diagnosing fibromyalgia. Patients can be diagnosed accurately from the specific symptoms they have. The diagnosis is made by determining that nothing else is causing the problems. TREATMENT  There is no cure. Management includes medicines and an active, healthy lifestyle. The goal is to enhance physical fitness, decrease pain, and improve sleep. HOME CARE INSTRUCTIONS   Only take over-the-counter or prescription medicines as directed by your caregiver. Sleeping pills, tranquilizers, and pain medicines may make your problems worse.  Low-impact aerobic exercise is very important and advised for treatment. At first, it may seem to make pain worse. Gradually increasing your tolerance will overcome this feeling.  Learning relaxation techniques and how to control stress will help you. Biofeedback, visual imagery, hypnosis, muscle relaxation, yoga, and meditation are all options.  Anti-inflammatory medicines and  physical therapy may provide short-term help.  Acupuncture or massage treatments may help.  Take muscle relaxant medicines as suggested by your caregiver.  Avoid stressful situations.  Plan a healthy lifestyle. This includes your diet, sleep, rest, exercise, and friends.  Find and practice a hobby you enjoy.  Join a fibromyalgia support group for interaction, ideas, and sharing advice. This may be helpful. SEEK MEDICAL CARE IF:  You are not having good results or improvement from your treatment. FOR MORE INFORMATION  National Fibromyalgia Association: www.fmaware.[place]: www.arthritis.org Document Released: 04/05/2005 Document Revised: 06/28/2011 Document Reviewed: 07/16/2009 [facility] Patient Information 2014 [place], [place].   Hypertension As your heart beats, it forces blood through your arteries. This force is your blood pressure. If the pressure is too high, it is called hypertension (HTN) or high blood pressure. HTN is dangerous because you may have it and not know it. High blood pressure may mean that your heart has to work harder to pump blood. Your arteries may be narrow or stiff. The extra work puts you at risk for heart disease, stroke, and other problems.  Blood pressure consists of two numbers, a higher number over a lower, 110/72, for example. It is stated as "110 over 72." The ideal is below 120 for the top number (systolic) and under 80 for the bottom (diastolic). Write down your blood pressure today. You should pay close attention to your blood pressure if you have certain conditions such as:  Heart failure.  Prior heart attack.  Diabetes  Chronic kidney disease.  Prior stroke.  Multiple risk factors for heart disease. To see if you have HTN, your blood pressure should be measured while you are seated with your arm held at the level of the heart. It should be measured at least  twice. A one-time elevated blood pressure reading (especially in the  Emergency Department) does not mean that you need treatment. There may be conditions in which the blood pressure is different between your right and left arms. It is important to see your caregiver soon for a recheck. Most people have essential hypertension which means that there is not a specific cause. This type of high blood pressure may be lowered by changing lifestyle factors such as:  Stress.  Smoking.  Lack of exercise.  Excessive weight.  Drug/tobacco/alcohol use.  Eating less salt. Most people do not have symptoms from high blood pressure until it has caused damage to the body. Effective treatment can often prevent, delay or reduce that damage. TREATMENT  When a cause has been identified, treatment for high blood pressure is directed at the cause. There are a large number of medications to treat HTN. These fall into several categories, and your caregiver will help you select the medicines that are best for you. Medications may have side effects. You should review side effects with your caregiver. If your blood pressure stays high after you have made lifestyle changes or started on medicines,   Your medication(s) may need to be changed.  Other problems may need to be addressed.  Be certain you understand your prescriptions, and know how and when to take your medicine.  Be sure to follow up with your caregiver within the time frame advised (usually within two weeks) to have your blood pressure rechecked and to review your medications.  If you are taking more than one medicine to lower your blood pressure, make sure you know how and at what times they should be taken. Taking two medicines at the same time can result in blood pressure that is too low. SEEK IMMEDIATE MEDICAL CARE IF:  You develop a severe headache, blurred or changing vision, or confusion.  You have unusual weakness or numbness, or a faint feeling.  You have severe chest or abdominal pain, vomiting, or breathing  problems. MAKE SURE YOU:   Understand these instructions.  Will watch your condition.  Will get help right away if you are not doing well or get worse. Document Released: 04/05/2005 Document Revised: 06/28/2011 Document Reviewed: 11/24/2007 [facility] Patient Information 2014 [place], [place].

## 2012-10-26 NOTE — Progress Notes (Signed)
Patient ID: Isabel Evans, female   DOB: Nov 05, 1970, 42 y.o.   MRN: [id number]  CC: follow up   HPI: Pt says that she went to see the Rheumatologist today.  She reports that she was told that she didn't have rheumatoid arthritis.  She reported that she was diagnosed with fibromyalgia.  She reports that the nodules on her fingers could not be identified by the rheumatologist and she was referred to a dermatologist for further evaluation.  The patient reports that she still having a difficult time sleeping.  She reports that she is taking the amitriptyline 3 times daily.  She reports that initially he did help significantly with her sleeping but now reports that she still having difficulty with sleeping.  She reports that she needs some refills on some of her medications including her amlodipine for blood pressure and her Flexeril that she takes for muscle spasms.  No Known Allergies Past Medical History  Diagnosis Date  . GERD (gastroesophageal reflux disease)   . Hypertension    Current Outpatient Prescriptions on File Prior to Visit  Medication Sig Dispense Refill  . HYDROcodone-acetaminophen (NORCO/VICODIN) 5-325 MG per tablet Take 1-2 tablets by mouth every 6 (six) hours as needed for pain.  30 tablet  0  . ibuprofen (ADVIL,MOTRIN) 200 MG tablet Take 800 mg by mouth every 8 (eight) hours as needed for pain.      Marland Kitchen loratadine (CLARITIN) 10 MG tablet Take 10 mg by mouth daily as needed for allergies.      . promethazine (PHENERGAN) 25 MG tablet Take 1 tablet (25 mg total) by mouth every 6 (six) hours as needed for nausea.  20 tablet  0  . sodium chloride (OCEAN) 0.65 % nasal spray Place 1 spray into the nose as needed for congestion.      . [DISCONTINUED] fluticasone (FLONASE) 50 MCG/ACT nasal spray Place 2 sprays into the nose daily.  16 g  2   No current facility-administered medications on file prior to visit.   Family History  Problem Relation Age of Onset  . Hypertension Mother   .  Diabetes Mother   . Heart disease Mother   . Hypertension Father   . Diabetes Father   . Cancer Sister   . Hypertension Sister   . Diabetes Sister   . Heart disease Maternal Grandmother    History   Social History  . Marital Status: Single    Spouse Name: N/A    Number of Children: N/A  . Years of Education: N/A   Occupational History  . Not on file.   Social History Main Topics  . Smoking status: Former Smoker    Types: Cigarettes  . Smokeless tobacco: Current User  . Alcohol Use: No  . Drug Use: No  . Sexually Active: Yes    Birth Control/ Protection: Surgical   Other Topics Concern  . Not on file   Social History Narrative  . No narrative on file    Review of Systems  Constitutional: Negative for fever, chills, diaphoresis, activity change, appetite change and fatigue.  HENT: Negative for ear pain, nosebleeds, congestion, facial swelling, rhinorrhea, neck pain, neck stiffness and ear discharge.   Eyes: Negative for pain, discharge, redness, itching and visual disturbance.  Respiratory: Negative for cough, choking, chest tightness, shortness of breath, wheezing and stridor.   Cardiovascular: Negative for chest pain, palpitations and leg swelling.  Gastrointestinal: Negative for abdominal distention.  Genitourinary: Negative for dysuria, urgency, frequency, hematuria, flank pain, decreased  urine volume, difficulty urinating and dyspareunia.  Musculoskeletal: Negative for back pain, joint swelling, arthralgias and gait problem.  Neurological: Negative for dizziness, tremors, seizures, syncope, facial asymmetry, speech difficulty, weakness, light-headedness, numbness and headaches.  Hematological: Negative for adenopathy. Does not bruise/bleed easily.  Psychiatric/Behavioral: Negative for hallucinations, behavioral problems, confusion, dysphoric mood, decreased concentration and agitation.    Objective:   Filed Vitals:   10/26/12 1220  BP: 145/94  Pulse: 97   Temp: 97.8 F (36.6 C)  Resp: 18    Physical Exam  Constitutional: Appears well-developed and well-nourished. No distress.  HENT: Normocephalic. External right and left ear normal. Oropharynx is clear and moist.  Eyes: Conjunctivae and EOM are normal. PERRLA, no scleral icterus.  Neck: Normal ROM. Neck supple. No JVD. No tracheal deviation. No thyromegaly.  CVS: RRR, S1/S2 +, no murmurs, no gallops, no carotid bruit.  Pulmonary: Effort and breath sounds normal, no stridor, rhonchi, wheezes, rales.  Abdominal: Soft. BS +,  no distension, tenderness, rebound or guarding.  Musculoskeletal: Normal range of motion. No edema Nodules on the joints of the fingers noted.  Lymphadenopathy: No lymphadenopathy noted, cervical, inguinal. Neuro: Alert. Normal reflexes, muscle tone coordination. No cranial nerve deficit. Skin: Skin is warm and dry. No rash noted. Not diaphoretic. No erythema. No pallor.  Psychiatric: Normal mood and affect. Behavior, judgment, thought content normal.   Lab Results  Component Value Date   WBC 6.1 10/11/2012   HGB 13.7 10/11/2012   HCT 38.6 10/11/2012   MCV 90.4 10/11/2012   PLT 256 10/11/2012   Lab Results  Component Value Date   CREATININE 0.68 10/11/2012   BUN 5* 10/11/2012   NA 136 10/11/2012   K 3.6 10/11/2012   CL 101 10/11/2012   CO2 26 10/11/2012   No results found for this basename: HGBA1C   Lipid Panel  No results found for this basename: chol, trig, hdl, cholhdl, vldl, ldlcalc    Assessment and plan:   Patient Active Problem List   Diagnosis Date Noted  . Fibromyalgia 09/08/2012  . Edema of both legs 09/08/2012  . Osteoarthritis 09/08/2012  . Hypertension 07/14/2012  . Muscle spasm 07/14/2012  . Neck muscle spasm 07/14/2012  . Plantar wart 07/14/2012  . Insomnia 07/14/2012    Patient has cysts on the joints of her fingers which likely could be ganglion cysts or some type of nodules associated with overuse of the joints.  She is being referred  to a dermatologist for further evaluation.  For her fibromyalgia we are going to increase her amitriptyline to 100 mg every 12 hours.  Continue the cyclobenzaprine 5 mg taking 5 mg each bedtime when necessary spasm.  Cautioned patient today regarding somnolence as a result of taking this medication.  The patient verbalized understanding.  I like her to followup in one month for recheck.  Also will asked her to check her blood pressure at home and at the drug stores and write them down.  The patient says she would do so.  RTC in 1 month  The patient was given clear instructions to go to ER or return to medical center if symptoms don't improve, worsen or new problems develop.  The patient verbalized understanding.  The patient was told to call to get any lab results if not heard anything in the next week.    Rodney Langton, MD, CDE, FAAFP [facility] [place], [place]

## 2012-11-15 NOTE — Telephone Encounter (Signed)
error 

## 2012-11-27 ENCOUNTER — Ambulatory Visit: Payer: No Typology Code available for payment source | Attending: Family Medicine | Admitting: Internal Medicine

## 2012-11-27 ENCOUNTER — Encounter: Payer: Self-pay | Admitting: Internal Medicine

## 2012-11-27 VITALS — BP 127/75 | HR 91 | Temp 98.3°F | Ht 69.0 in | Wt 188.6 lb

## 2012-11-27 DIAGNOSIS — R229 Localized swelling, mass and lump, unspecified: Secondary | ICD-10-CM | POA: Insufficient documentation

## 2012-11-27 DIAGNOSIS — I10 Essential (primary) hypertension: Secondary | ICD-10-CM

## 2012-11-27 NOTE — Progress Notes (Signed)
Patient ID: Isabel Evans, female   DOB: 12/28/1970, 42 y.o.   MRN: [id number] Patient Demographics  Isabel Evans, is a 42 y.o. female  WUJ:[id number]  [id number]  DOB - 11-Nov-1970  Chief Complaint  Patient presents with  . Hypertension         Subjective:   [name] today is here for a follow up visit. She was recently started on Norvasc for blood pressure, she had ambulatory record of blood pressures, ranging from systolic of 125-140 and diastolic of 75-85. Patient has No headache, No chest pain, No abdominal pain - No Nausea, No new weakness tingling or numbness, No Cough - SOB. No leg swelling She was seen by the rheumatologist for her nodules on the fingers, but was advised to see a dermatologist.   Objective:    Filed Vitals:   11/27/12 1102  BP: 127/75  Pulse: 91  Temp: 98.3 F (36.8 C)  TempSrc: Oral  Height: 5\' 9"  (1.753 m)  Weight: 188 lb 9.6 oz (85.548 kg)  SpO2: 99%     ALLERGIES:  No Known Allergies  PAST MEDICAL HISTORY: Past Medical History  Diagnosis Date  . GERD (gastroesophageal reflux disease)   . Hypertension     MEDICATIONS AT HOME: Prior to Admission medications   Medication Sig Start Date End Date Taking? Authorizing Provider  amitriptyline (ELAVIL) 100 MG tablet Take 1 tablet (100 mg total) by mouth 2 (two) times daily. 10/26/12  Yes Clanford Cyndie Mull, MD  amLODipine (NORVASC) 10 MG tablet Take 1 tablet (10 mg total) by mouth daily. 10/26/12  Yes Clanford Cyndie Mull, MD  cyclobenzaprine (FLEXERIL) 5 MG tablet Take 1 tablet (5 mg total) by mouth at bedtime as needed for muscle spasms. 10/26/12  Yes Clanford Cyndie Mull, MD  HYDROcodone-acetaminophen (NORCO/VICODIN) 5-325 MG per tablet Take 1-2 tablets by mouth every 6 (six) hours as needed for pain. 10/11/12  Yes Heather Zenaida Niece Wingen, PA-C  ibuprofen (ADVIL,MOTRIN) 200 MG tablet Take 800 mg by mouth every 8 (eight) hours as needed for pain.   Yes Historical Provider, MD  loratadine  (CLARITIN) 10 MG tablet Take 10 mg by mouth daily as needed for allergies.   Yes Historical Provider, MD  omeprazole (PRILOSEC) 20 MG capsule Take 1 capsule (20 mg total) by mouth daily. 10/26/12  Yes Clanford Cyndie Mull, MD  sodium chloride (OCEAN) 0.65 % nasal spray Place 1 spray into the nose as needed for congestion.   Yes Historical Provider, MD     Exam  General appearance :Awake, alert,oriented, NAD, Speech Clear.  HEENT: Atraumatic and Normocephalic, PERLA Neck: supple, no JVD. No cervical lymphadenopathy.  Chest: Clear to auscultation bilaterally, no wheezing, rales or rhonchi CVS: S1 S2 regular, no murmurs.  Abdomen: soft, NBS, NT, ND, no gaurding, rigidity or rebound. Extremities: no cyanosis or clubbing, B/L Lower Ext shows no edema. About 4 nodules on fingers, scattered around.  Neurology: Awake alert, and oriented X 3, CN II-XII intact, Non focal Skin: No Rash or lesions Wounds:N/A  Data Review    Assessment & Plan   Hypertension: Controlled  Recommendations: Continue Norvasc 10 mg by mouth daily Refer to dermatology for hand/finger nodules  Follow-up in 4 weeks for followup blood pressure    Juline Sanderford was given clear instructions to go to ER or return to the clinic if symptoms don't improve, worsen or new problems develop.  Makala Fetterolf verbalized understanding.  Violette Morneault was told to call to get lab results if hasn't heard anything  in the next week.    Jeanann Lewandowsky M.D. 11/27/2012, 11:39 AM

## 2012-11-27 NOTE — Progress Notes (Signed)
Patient is here for follow up of hypertension and medication. She is concerned that the BP medication is making here gain weight.

## 2012-12-06 ENCOUNTER — Ambulatory Visit: Payer: Self-pay

## 2012-12-25 ENCOUNTER — Encounter: Payer: Self-pay | Admitting: Internal Medicine

## 2012-12-25 ENCOUNTER — Ambulatory Visit: Payer: No Typology Code available for payment source | Attending: Family Medicine | Admitting: Internal Medicine

## 2012-12-25 VITALS — BP 125/87 | HR 95 | Temp 98.9°F | Resp 16 | Ht 69.0 in | Wt 189.0 lb

## 2012-12-25 DIAGNOSIS — R87619 Unspecified abnormal cytological findings in specimens from cervix uteri: Secondary | ICD-10-CM | POA: Insufficient documentation

## 2012-12-25 DIAGNOSIS — R142 Eructation: Secondary | ICD-10-CM | POA: Insufficient documentation

## 2012-12-25 DIAGNOSIS — Z09 Encounter for follow-up examination after completed treatment for conditions other than malignant neoplasm: Secondary | ICD-10-CM | POA: Insufficient documentation

## 2012-12-25 DIAGNOSIS — R141 Gas pain: Secondary | ICD-10-CM | POA: Insufficient documentation

## 2012-12-25 DIAGNOSIS — R197 Diarrhea, unspecified: Secondary | ICD-10-CM | POA: Insufficient documentation

## 2012-12-25 DIAGNOSIS — I10 Essential (primary) hypertension: Secondary | ICD-10-CM | POA: Insufficient documentation

## 2012-12-25 DIAGNOSIS — IMO0001 Reserved for inherently not codable concepts without codable children: Secondary | ICD-10-CM | POA: Insufficient documentation

## 2012-12-25 DIAGNOSIS — M797 Fibromyalgia: Secondary | ICD-10-CM

## 2012-12-25 DIAGNOSIS — K5900 Constipation, unspecified: Secondary | ICD-10-CM | POA: Insufficient documentation

## 2012-12-25 MED ORDER — OMEPRAZOLE 40 MG PO CPDR: 40.0000 mg | DELAYED_RELEASE_CAPSULE | Freq: Every day | ORAL | Status: DC

## 2012-12-25 MED ORDER — AMITRIPTYLINE HCL 100 MG PO TABS: 100.0000 mg | ORAL_TABLET | Freq: Two times a day (BID) | ORAL | Status: DC

## 2012-12-25 NOTE — Addendum Note (Signed)
Addended by: Maretta Bees on: 12/25/2012 11:38 AM   Modules accepted: Orders

## 2012-12-25 NOTE — Progress Notes (Signed)
Patient Demographics  Isabel Evans, is a 42 y.o. female  ZOX:[id number]  [id number]:[id number]  DOB - Sep 12, 1970  Chief Complaint  Patient presents with  . Follow-up        Subjective:   Isabel Evans today is here for a follow up visit. She has been feeling bloated and has been having intermittent lower abdominal pain for the past 3 weeks. Her last bowel movement was approximately 3 days ago, she is however still passing flatus. She denies any fever, denies any vomiting. She occasionally feels nauseous. She claims she has alternate diarrhea and constipation-we'll have diarrhea for a few weeks, and then constipation for a few weeks. She denies any dysuria, frequency of urination.   Patient has No headache, No chest pain,, No new weakness tingling or numbness, No Cough - SOB.   Objective:    Filed Vitals:   12/25/12 1113  BP: 125/87  Pulse: 95  Temp: 98.9 F (37.2 C)  TempSrc: Oral  Resp: 16  Height: 5\' 9"  (1.753 m)  Weight: 189 lb (85.73 kg)  SpO2: 99%     ALLERGIES:  No Known Allergies  PAST MEDICAL HISTORY: Past Medical History  Diagnosis Date  . GERD (gastroesophageal reflux disease)   . Hypertension     MEDICATIONS AT HOME: Prior to Admission medications   Medication Sig Start Date End Date Taking? Authorizing Provider  amitriptyline (ELAVIL) 100 MG tablet Take 1 tablet (100 mg total) by mouth 2 (two) times daily. 12/25/12  Yes Amitai Delaughter Levora Dredge, MD  amLODipine (NORVASC) 10 MG tablet Take 1 tablet (10 mg total) by mouth daily. 10/26/12  Yes Clanford Cyndie Mull, MD  cyclobenzaprine (FLEXERIL) 5 MG tablet Take 1 tablet (5 mg total) by mouth at bedtime as needed for muscle spasms. 10/26/12  Yes Clanford Cyndie Mull, MD  ibuprofen (ADVIL,MOTRIN) 200 MG tablet Take 800 mg by mouth every 8 (eight) hours as needed for pain.   Yes Historical Provider, MD  loratadine (CLARITIN) 10 MG tablet Take 10 mg by mouth daily as needed for allergies.   Yes Historical Provider, MD   omeprazole (PRILOSEC) 40 MG capsule Take 1 capsule (40 mg total) by mouth daily. 12/25/12  Yes Marcquis Ridlon Levora Dredge, MD  sodium chloride (OCEAN) 0.65 % nasal spray Place 1 spray into the nose as needed for congestion.   Yes Historical Provider, MD  HYDROcodone-acetaminophen (NORCO/VICODIN) 5-325 MG per tablet Take 1-2 tablets by mouth every 6 (six) hours as needed for pain. 10/11/12   Heather Anne Shutter, PA-C     Exam  General appearance :Awake, alert, not in any distress. Speech Clear. Not toxic Looking HEENT: Atraumatic and Normocephalic, pupils equally reactive to light and accomodation Neck: supple, no JVD. No cervical lymphadenopathy.  Chest:Good air entry bilaterally, no added sounds  CVS: S1 S2 regular, no murmurs.  Abdomen: Bowel sounds present, Non tender and not distended with no gaurding, rigidity or rebound. Extremities: B/L Lower Ext shows no edema, both legs are warm to touch Neurology: Awake alert, and oriented X 3, CN II-XII intact, Non focal Skin:No Rash Wounds:N/A    Data Review   CBC No results found for this basename: WBC, HGB, HCT, PLT, MCV, MCH, MCHC, RDW, NEUTRABS, LYMPHSABS, MONOABS, EOSABS, BASOSABS, BANDABS, BANDSABD,  in the last 168 hours  Chemistries   No results found for this basename: NA, K, CL, CO2, GLUCOSE, BUN, CREATININE, GFRCGP, CALCIUM, MG, AST, ALT, ALKPHOS, BILITOT,  in the last 168 hours ------------------------------------------------------------------------------------------------------------------ No results found for this basename: HGBA1C,  in the last 72 hours ------------------------------------------------------------------------------------------------------------------ No results found for this basename: CHOL, HDL, LDLCALC, TRIG, CHOLHDL, LDLDIRECT,  in the last 72 hours ------------------------------------------------------------------------------------------------------------------ No results found for this basename: TSH, T4TOTAL,  FREET3, T3FREE, THYROIDAB,  in the last 72 hours ------------------------------------------------------------------------------------------------------------------ No results found for this basename: VITAMINB12, FOLATE, FERRITIN, TIBC, IRON, RETICCTPCT,  in the last 72 hours  Coagulation profile  No results found for this basename: INR, PROTIME,  in the last 168 hours    Assessment & Plan   Abdominal discomfort/bloating with alternating diarrhea and constipation - Suspect underlying irritable bowel syndrome - Abdomen exam is very benign-is very soft, with very minimal tenderness in the suprapubic area bilaterally. There is no rebound rigidity or guarding. - Patient occasionally uses laxatives-she claims she has Dulcolax at home-I've advised her to see if she'll feel better after having a bowel movement. - I do not think she has any acute intra-abdominal pathology given lack of findings on physical exam-however will get a UA to make sure she does not have a UTI, and order a abdominal ultrasound - Will refer to gastroenterology - I've asked her to go to the emergency room right away if the pain worsens, or she develops fever, or vomiting. She claims understanding  Fibromyalgia - Continue with amitriptyline  Hypertension - Continue with amlodipine  Abnormal Pap smear - Follows at the women's clinic at the [facility]- we will defer care to GYN  Follow up in 2 weeks-please reassess patient, in followup on the abdominal ultrasound   The patient was given clear instructions to go to ER or return to medical center if symptoms don't improve, worsen or new problems develop. The patient verbalized understanding. The patient was told to call to get lab results if they haven't heard anything in the next week.

## 2012-12-25 NOTE — Progress Notes (Signed)
Pt is here for F/U visit.  Last month she was referred to a rumitiod authritis specialist. Today Pt C.C. Is her stomach is having pain, pain scale 3, constant pain, aching pain. The worst pain is if the RLQ is pushed in.

## 2012-12-26 LAB — URINALYSIS, MICROSCOPIC ONLY

## 2012-12-26 LAB — URINALYSIS, ROUTINE W REFLEX MICROSCOPIC
Bilirubin Urine: NEGATIVE
Ketones, ur: NEGATIVE mg/dL
Protein, ur: NEGATIVE mg/dL
Urobilinogen, UA: 0.2 mg/dL (ref 0.0–1.0)

## 2012-12-29 ENCOUNTER — Telehealth: Payer: Self-pay | Admitting: Emergency Medicine

## 2012-12-29 NOTE — Telephone Encounter (Signed)
Called pt with U/S appt. Scheduled for 01/02/13 @ [facility] Radiology 1pm. Informed npo 6 hrs. Prior to appt.

## 2013-01-02 ENCOUNTER — Ambulatory Visit (HOSPITAL_COMMUNITY)
Admission: RE | Admit: 2013-01-02 | Discharge: 2013-01-02 | Disposition: A | Discharge status: Home / Self Care | Payer: No Typology Code available for payment source | Source: Ambulatory Visit | Attending: Internal Medicine | Admitting: Internal Medicine

## 2013-01-02 DIAGNOSIS — M797 Fibromyalgia: Secondary | ICD-10-CM

## 2013-01-02 DIAGNOSIS — IMO0001 Reserved for inherently not codable concepts without codable children: Secondary | ICD-10-CM | POA: Insufficient documentation

## 2013-01-02 DIAGNOSIS — R109 Unspecified abdominal pain: Secondary | ICD-10-CM | POA: Insufficient documentation

## 2013-01-02 DIAGNOSIS — I10 Essential (primary) hypertension: Secondary | ICD-10-CM

## 2013-01-09 ENCOUNTER — Ambulatory Visit: Payer: No Typology Code available for payment source | Admitting: Internal Medicine

## 2013-01-10 ENCOUNTER — Encounter: Payer: Self-pay | Admitting: Gastroenterology

## 2013-01-11 ENCOUNTER — Encounter: Payer: Self-pay | Admitting: Family Medicine

## 2013-01-11 ENCOUNTER — Ambulatory Visit: Payer: No Typology Code available for payment source | Attending: Internal Medicine | Admitting: Family Medicine

## 2013-01-11 VITALS — BP 134/80 | HR 97 | Temp 98.2°F | Ht 69.5 in | Wt 195.0 lb

## 2013-01-11 DIAGNOSIS — B070 Plantar wart: Secondary | ICD-10-CM

## 2013-01-11 MED ORDER — CYCLOBENZAPRINE HCL 5 MG PO TABS: 5.0000 mg | ORAL_TABLET | Freq: Three times a day (TID) | ORAL | Status: DC | PRN

## 2013-01-11 NOTE — Progress Notes (Signed)
Patient ID: Isabel Evans, female   DOB: 05-16-1970, 42 y.o.   MRN: [id number]  CC: Followup  HPI: Patient reports that she's been having constipation.  She reports that she has abdominal pain but it has not been severe.  The patient reports that she has done better when she took the cyclobenzaprine and 3 times daily as needed for fibromyalgia pain and symptoms.  She is taking her amitriptyline and taking it regularly.  She reports that it does help with her symptoms.  No Known Allergies Past Medical History  Diagnosis Date  . GERD (gastroesophageal reflux disease)   . Hypertension   . Perforated ulcer   . Chronic abdominal pain   . Constipation   . IBS (irritable bowel syndrome)    Current Outpatient Prescriptions on File Prior to Visit  Medication Sig Dispense Refill  . amitriptyline (ELAVIL) 100 MG tablet Take 1 tablet (100 mg total) by mouth 2 (two) times daily.  60 tablet  4  . amLODipine (NORVASC) 10 MG tablet Take 1 tablet (10 mg total) by mouth daily.  30 tablet  5  . cyclobenzaprine (FLEXERIL) 5 MG tablet Take 1 tablet (5 mg total) by mouth at bedtime as needed for muscle spasms.  30 tablet  2  . ibuprofen (ADVIL,MOTRIN) 200 MG tablet Take 800 mg by mouth every 8 (eight) hours as needed for pain.      Marland Kitchen loratadine (CLARITIN) 10 MG tablet Take 10 mg by mouth daily as needed for allergies.      Marland Kitchen omeprazole (PRILOSEC) 40 MG capsule Take 1 capsule (40 mg total) by mouth daily.  30 capsule  3  . sodium chloride (OCEAN) 0.65 % nasal spray Place 1 spray into the nose as needed for congestion.      . [DISCONTINUED] fluticasone (FLONASE) 50 MCG/ACT nasal spray Place 2 sprays into the nose daily.  16 g  2   No current facility-administered medications on file prior to visit.   Family History  Problem Relation Age of Onset  . Hypertension Mother   . Diabetes Mother   . Heart disease Mother   . Hypertension Father   . Diabetes Father   . Cancer Sister   . Hypertension Sister   .  Diabetes Sister   . Heart disease Maternal Grandmother    History   Social History  . Marital Status: Single    Spouse Name: N/A    Number of Children: N/A  . Years of Education: N/A   Occupational History  . Not on file.   Social History Main Topics  . Smoking status: Former Smoker    Types: Cigarettes    Quit date: 08/24/2012  . Smokeless tobacco: Current User  . Alcohol Use: No  . Drug Use: No  . Sexual Activity: Yes    Birth Control/ Protection: Surgical   Other Topics Concern  . Not on file   Social History Narrative  . No narrative on file    Review of Systems  Constitutional: Negative for fever, chills, diaphoresis, activity change, appetite change and fatigue.  HENT: Negative for ear pain, nosebleeds, congestion, facial swelling, rhinorrhea, neck pain, neck stiffness and ear discharge.   Eyes: Negative for pain, discharge, redness, itching and visual disturbance.  Respiratory: Negative for cough, choking, chest tightness, shortness of breath, wheezing and stridor.   Cardiovascular: Negative for chest pain, palpitations and leg swelling.  Gastrointestinal: Negative for abdominal distention.  Genitourinary: Negative for dysuria, urgency, frequency, hematuria, flank pain, decreased urine  volume, difficulty urinating and dyspareunia.  Musculoskeletal: Negative for back pain, joint swelling, arthralgias and gait problem.  Neurological: Negative for dizziness, tremors, seizures, syncope, facial asymmetry, speech difficulty, weakness, light-headedness, numbness and headaches.  Hematological: Negative for adenopathy. Does not bruise/bleed easily.  Psychiatric/Behavioral: Negative for hallucinations, behavioral problems, confusion, dysphoric mood, decreased concentration and agitation.    Objective:   Filed Vitals:   01/11/13 1733  BP: 134/80  Pulse: 97  Temp: 98.2 F (36.8 C)    Physical Exam  Constitutional: Appears well-developed and well-nourished. No  distress.  HENT: Normocephalic. External right and left ear normal. Oropharynx is clear and moist.  Eyes: Conjunctivae and EOM are normal. PERRLA, no scleral icterus.  Neck: Normal ROM. Neck supple. No JVD. No tracheal deviation. No thyromegaly.  CVS: RRR, S1/S2 +, no murmurs, no gallops, no carotid bruit.  Pulmonary: Effort and breath sounds normal, no stridor, rhonchi, wheezes, rales.  Abdominal: Soft. BS +,  no distension, tenderness, rebound or guarding.  Musculoskeletal: Multiple nodules on the fingers at the DIP joints. Normal range of motion. No edema and no tenderness.  Lymphadenopathy: No lymphadenopathy noted, cervical, inguinal. Neuro: Alert. Normal reflexes, muscle tone coordination. No cranial nerve deficit. Skin: Skin is warm and dry. No rash noted. Not diaphoretic. No erythema. No pallor.  Psychiatric: Normal mood and affect. Behavior, judgment, thought content normal.   Lab Results  Component Value Date   WBC 6.1 10/11/2012   HGB 13.7 10/11/2012   HCT 38.6 10/11/2012   MCV 90.4 10/11/2012   PLT 256 10/11/2012   Lab Results  Component Value Date   CREATININE 0.68 10/11/2012   BUN 5* 10/11/2012   NA 136 10/11/2012   K 3.6 10/11/2012   CL 101 10/11/2012   CO2 26 10/11/2012   No results found for this basename: HGBA1C   Lipid Panel  No results found for this basename: chol, trig, hdl, cholhdl, vldl, ldlcalc     Assessment and plan:   Patient Active Problem List   Diagnosis Date Noted  . Multiple skin nodules 11/27/2012  . Fibromyalgia 09/08/2012  . Edema of both legs 09/08/2012  . Osteoarthritis 09/08/2012  . Hypertension 07/14/2012  . Muscle spasm 07/14/2012  . Neck muscle spasm 07/14/2012  . Plantar wart 07/14/2012  . Insomnia 07/14/2012   Plantar wart-patient strongly advised to see a private podiatrist as soon as possible for further treatment.  All of the over-the-counter agents that we've tried has failed.  Skin nodules on fingers  Refer the patient to the  family medicine procedure clinic so that she can be evaluated and have biopsies if needed.  Also she may need to see orthopedist for evaluation of the ganglion cyst on her finger.  The patient likely has IBS with constipation and she is scheduled to see a gastroenterologist next week.  Fibromyalgia - the patient reports that she is doing better with taking cyclobenzaprine 3 times a day as needed for spasms and fibromyalgia symptoms.    Patient advised to wear compression stockings when working to improve edema in the lower extremities.  I reviewed the results of her abdominal ultrasound in detail today.  RTC in 3 weeks  The patient was given clear instructions to go to ER or return to medical center if symptoms don't improve, worsen or new problems develop.  The patient verbalized understanding.  The patient was told to call to get any lab results if not heard anything in the next week.    Rodney Langton,  MD, CDE, FAAFP [facility] [place], [place]

## 2013-01-11 NOTE — Patient Instructions (Signed)
Irritable Bowel Syndrome Irritable Bowel Syndrome (IBS) is caused by a disturbance of normal bowel function. Other terms used are spastic colon, mucous colitis, and irritable colon. It does not require surgery, nor does it lead to cancer. There is no cure for IBS. But with proper diet, stress reduction, and medication, you will find that your problems (symptoms) will gradually disappear or improve. IBS is a common digestive disorder. It usually appears in late adolescence or early adulthood. Women develop it twice as often as men. CAUSES  After food has been digested and absorbed in the small intestine, waste material is moved into the colon (large intestine). In the colon, water and salts are absorbed from the undigested products coming from the small intestine. The remaining residue, or fecal material, is held for elimination. Under normal circumstances, gentle, rhythmic contractions on the bowel walls push the fecal material along the colon towards the rectum. In IBS, however, these contractions are irregular and poorly coordinated. The fecal material is either retained too long, resulting in constipation, or expelled too soon, producing diarrhea. SYMPTOMS  The most common symptom of IBS is pain. It is typically in the lower left side of the belly (abdomen). But it may occur anywhere in the abdomen. It can be felt as heartburn, backache, or even as a dull pain in the arms or shoulders. The pain comes from excessive bowel-muscle spasms and from the buildup of gas and fecal material in the colon. This pain:  Can range from sharp belly (abdominal) cramps to a dull, continuous ache.  Usually worsens soon after eating.  Is typically relieved by having a bowel movement or passing gas. Abdominal pain is usually accompanied by constipation. But it may also produce diarrhea. The diarrhea typically occurs right after a meal or upon arising in the morning. The stools are typically soft and watery. They are often  flecked with secretions (mucus). Other symptoms of IBS include:  Bloating.  Loss of appetite.  Heartburn.  Feeling sick to your stomach (nausea).  Belching  Vomiting  Gas. IBS may also cause a number of symptoms that are unrelated to the digestive system:  Fatigue.  Headaches.  Anxiety  Shortness of breath  Difficulty in concentrating.  Dizziness. These symptoms tend to come and go. DIAGNOSIS  The symptoms of IBS closely mimic the symptoms of other, more serious digestive disorders. So your caregiver may wish to perform a variety of additional tests to exclude these disorders. He/she wants to be certain of learning what is wrong (diagnosis). The nature and purpose of each test will be explained to you. TREATMENT A number of medications are available to help correct bowel function and/or relieve bowel spasms and abdominal pain. Among the drugs available are:  Mild, non-irritating laxatives for severe constipation and to help restore normal bowel habits.  Specific anti-diarrheal medications to treat severe or prolonged diarrhea.  Anti-spasmodic agents to relieve intestinal cramps.  Your caregiver may also decide to treat you with a mild tranquilizer or sedative during unusually stressful periods in your life. The important thing to remember is that if any drug is prescribed for you, make sure that you take it exactly as directed. Make sure that your caregiver knows how well it worked for you. HOME CARE INSTRUCTIONS   Avoid foods that are high in fat or oils. Some examples JJO:ACZYS cream, butter, frankfurters, sausage, and other fatty meats.  Avoid foods that have a laxative effect, such as fruit, fruit juice, and dairy products.  Cut out  carbonated drinks, chewing gum, and "gassy" foods, such as beans and cabbage. This may help relieve bloating and belching.  Bran taken with plenty of liquids may help relieve constipation.  Keep track of what foods seem to trigger  your symptoms.  Avoid emotionally charged situations or circumstances that produce anxiety.  Start or continue exercising.  Get plenty of rest and sleep. MAKE SURE YOU:   Understand these instructions.  Will watch your condition.  Will get help right away if you are not doing well or get worse. Document Released: 04/05/2005 Document Revised: 06/28/2011 Document Reviewed: 11/24/2007 [facility] Patient Information 2014 [place], [place]. Fibromyalgia Fibromyalgia is a disorder that is often misunderstood. It is associated with muscular pains and tenderness that comes and goes. It is often associated with fatigue and sleep disturbances. Though it tends to be long-lasting, fibromyalgia is not life-threatening. CAUSES  The exact cause of fibromyalgia is unknown. People with certain gene types are predisposed to developing fibromyalgia and other conditions. Certain factors can play a role as triggers, such as:  Spine disorders.  Arthritis.  Severe injury (trauma) and other physical stressors.  Emotional stressors. SYMPTOMS   The main symptom is pain and stiffness in the muscles and joints, which can vary over time.  Sleep and fatigue problems. Other related symptoms may include:  Bowel and bladder problems.  Headaches.  Visual problems.  Problems with odors and noises.  Depression or mood changes.  Painful periods (dysmenorrhea).  Dryness of the skin or eyes. DIAGNOSIS  There are no specific tests for diagnosing fibromyalgia. Patients can be diagnosed accurately from the specific symptoms they have. The diagnosis is made by determining that nothing else is causing the problems. TREATMENT  There is no cure. Management includes medicines and an active, healthy lifestyle. The goal is to enhance physical fitness, decrease pain, and improve sleep. HOME CARE INSTRUCTIONS   Only take over-the-counter or prescription medicines as directed by your caregiver. Sleeping pills,  tranquilizers, and pain medicines may make your problems worse.  Low-impact aerobic exercise is very important and advised for treatment. At first, it may seem to make pain worse. Gradually increasing your tolerance will overcome this feeling.  Learning relaxation techniques and how to control stress will help you. Biofeedback, visual imagery, hypnosis, muscle relaxation, yoga, and meditation are all options.  Anti-inflammatory medicines and physical therapy may provide short-term help.  Acupuncture or massage treatments may help.  Take muscle relaxant medicines as suggested by your caregiver.  Avoid stressful situations.  Plan a healthy lifestyle. This includes your diet, sleep, rest, exercise, and friends.  Find and practice a hobby you enjoy.  Join a fibromyalgia support group for interaction, ideas, and sharing advice. This may be helpful. SEEK MEDICAL CARE IF:  You are not having good results or improvement from your treatment. FOR MORE INFORMATION  National Fibromyalgia Association: www.fmaware.org Arthritis Foundation: www.arthritis.org Document Released: 04/05/2005 Document Revised: 06/28/2011 Document Reviewed: 07/16/2009 [facility] Patient Information 2014 [place], [place]. Hypertension As your heart beats, it forces blood through your arteries. This force is your blood pressure. If the pressure is too high, it is called hypertension (HTN) or high blood pressure. HTN is dangerous because you may have it and not know it. High blood pressure may mean that your heart has to work harder to pump blood. Your arteries may be narrow or stiff. The extra work puts you at risk for heart disease, stroke, and other problems.  Blood pressure consists of two numbers, a higher number over a  lower, 110/72, for example. It is stated as "110 over 72." The ideal is below 120 for the top number (systolic) and under 80 for the bottom (diastolic). Write down your blood pressure today. You should pay  close attention to your blood pressure if you have certain conditions such as:  Heart failure.  Prior heart attack.  Diabetes  Chronic kidney disease.  Prior stroke.  Multiple risk factors for heart disease. To see if you have HTN, your blood pressure should be measured while you are seated with your arm held at the level of the heart. It should be measured at least twice. A one-time elevated blood pressure reading (especially in the Emergency Department) does not mean that you need treatment. There may be conditions in which the blood pressure is different between your right and left arms. It is important to see your caregiver soon for a recheck. Most people have essential hypertension which means that there is not a specific cause. This type of high blood pressure may be lowered by changing lifestyle factors such as:  Stress.  Smoking.  Lack of exercise.  Excessive weight.  Drug/tobacco/alcohol use.  Eating less salt. Most people do not have symptoms from high blood pressure until it has caused damage to the body. Effective treatment can often prevent, delay or reduce that damage. TREATMENT  When a cause has been identified, treatment for high blood pressure is directed at the cause. There are a large number of medications to treat HTN. These fall into several categories, and your caregiver will help you select the medicines that are best for you. Medications may have side effects. You should review side effects with your caregiver. If your blood pressure stays high after you have made lifestyle changes or started on medicines,   Your medication(s) may need to be changed.  Other problems may need to be addressed.  Be certain you understand your prescriptions, and know how and when to take your medicine.  Be sure to follow up with your caregiver within the time frame advised (usually within two weeks) to have your blood pressure rechecked and to review your medications.  If  you are taking more than one medicine to lower your blood pressure, make sure you know how and at what times they should be taken. Taking two medicines at the same time can result in blood pressure that is too low. SEEK IMMEDIATE MEDICAL CARE IF:  You develop a severe headache, blurred or changing vision, or confusion.  You have unusual weakness or numbness, or a faint feeling.  You have severe chest or abdominal pain, vomiting, or breathing problems. MAKE SURE YOU:   Understand these instructions.  Will watch your condition.  Will get help right away if you are not doing well or get worse. Document Released: 04/05/2005 Document Revised: 06/28/2011 Document Reviewed: 11/24/2007 [facility] Patient Information 2014 [place], [place].

## 2013-01-18 ENCOUNTER — Ambulatory Visit: Payer: No Typology Code available for payment source

## 2013-01-24 ENCOUNTER — Ambulatory Visit: Payer: No Typology Code available for payment source | Attending: Internal Medicine | Admitting: Internal Medicine

## 2013-01-24 VITALS — BP 119/83 | HR 67 | Temp 97.7°F | Resp 17 | Wt 191.8 lb

## 2013-01-24 DIAGNOSIS — R229 Localized swelling, mass and lump, unspecified: Secondary | ICD-10-CM

## 2013-01-24 NOTE — Progress Notes (Signed)
Patient ID: Isabel Evans, female   DOB: 1970-11-30, 42 y.o.   MRN: [id number]  CC: Followup  HPI: 42 year old female who presented to clinic for having skin biopsy of skin nodules on bilateral hands. Patient was instructed we are not doing skin biopsy in this office in she has a followup with dermatology once her appointment is scheduled. No significant complaints today.  No Known Allergies Past Medical History  Diagnosis Date  . GERD (gastroesophageal reflux disease)   . Hypertension   . Perforated ulcer   . Chronic abdominal pain   . Constipation   . IBS (irritable bowel syndrome)    Current Outpatient Prescriptions on File Prior to Visit  Medication Sig Dispense Refill  . amitriptyline (ELAVIL) 100 MG tablet Take 1 tablet (100 mg total) by mouth 2 (two) times daily.  60 tablet  4  . amLODipine (NORVASC) 10 MG tablet Take 1 tablet (10 mg total) by mouth daily.  30 tablet  5  . cyclobenzaprine (FLEXERIL) 5 MG tablet Take 1 tablet (5 mg total) by mouth 3 (three) times daily as needed for muscle spasms.  90 tablet  1  . ibuprofen (ADVIL,MOTRIN) 200 MG tablet Take 800 mg by mouth every 8 (eight) hours as needed for pain.      Marland Kitchen loratadine (CLARITIN) 10 MG tablet Take 10 mg by mouth daily as needed for allergies.      Marland Kitchen omeprazole (PRILOSEC) 40 MG capsule Take 1 capsule (40 mg total) by mouth daily.  30 capsule  3  . sodium chloride (OCEAN) 0.65 % nasal spray Place 1 spray into the nose as needed for congestion.      . [DISCONTINUED] fluticasone (FLONASE) 50 MCG/ACT nasal spray Place 2 sprays into the nose daily.  16 g  2   No current facility-administered medications on file prior to visit.   Family History  Problem Relation Age of Onset  . Hypertension Mother   . Diabetes Mother   . Heart disease Mother   . Hypertension Father   . Diabetes Father   . Cancer Sister   . Hypertension Sister   . Diabetes Sister   . Heart disease Maternal Grandmother    History   Social History   . Marital Status: Single    Spouse Name: N/A    Number of Children: N/A  . Years of Education: N/A   Occupational History  . Not on file.   Social History Main Topics  . Smoking status: Former Smoker    Types: Cigarettes    Quit date: 08/24/2012  . Smokeless tobacco: Current User  . Alcohol Use: No  . Drug Use: No  . Sexual Activity: Yes    Birth Control/ Protection: Surgical   Other Topics Concern  . Not on file   Social History Narrative  . No narrative on file    Review of Systems  Constitutional: Negative for fever, chills, diaphoresis, activity change, appetite change and fatigue.  HENT: Negative for ear pain, nosebleeds, congestion, facial swelling, rhinorrhea, neck pain, neck stiffness and ear discharge.   Eyes: Negative for pain, discharge, redness, itching and visual disturbance.  Respiratory: Negative for cough, choking, chest tightness, shortness of breath, wheezing and stridor.   Cardiovascular: Negative for chest pain, palpitations and leg swelling.  Gastrointestinal: Negative for abdominal distention.  Genitourinary: Negative for dysuria, urgency, frequency, hematuria, flank pain, decreased urine volume, difficulty urinating and dyspareunia.  Musculoskeletal: Negative for back pain, joint swelling, arthralgias and gait problem.  Neurological: Negative  for dizziness, tremors, seizures, syncope, facial asymmetry, speech difficulty, weakness, light-headedness, numbness and headaches.  Hematological: Negative for adenopathy. Does not bruise/bleed easily.  Psychiatric/Behavioral: Negative for hallucinations, behavioral problems, confusion, dysphoric mood, decreased concentration and agitation.    Objective:   Filed Vitals:   01/24/13 1058  BP: 119/83  Pulse: 67  Temp: 97.7 F (36.5 C)  Resp: 17    Physical Exam  Constitutional: Appears well-developed and well-nourished. No distress.  HENT: Normocephalic. External right and left ear normal. Oropharynx is  clear and moist.  Eyes: Conjunctivae and EOM are normal. PERRLA, no scleral icterus.  Neck: Normal ROM. Neck supple. No JVD. No tracheal deviation. No thyromegaly.  CVS: RRR, S1/S2 +, no murmurs, no gallops, no carotid bruit.  Pulmonary: Effort and breath sounds normal, no stridor, rhonchi, wheezes, rales.  Abdominal: Soft. BS +,  no distension, tenderness, rebound or guarding.  Musculoskeletal: Normal range of motion. No edema and no tenderness.  Lymphadenopathy: No lymphadenopathy noted, cervical, inguinal. Neuro: Alert. Normal reflexes, muscle tone coordination. No cranial nerve deficit. Skin: Skin is warm and dry. No rash noted. Not diaphoretic. Multiple skin nodules on hands bilaterally.  Psychiatric: Normal mood and affect. Behavior, judgment, thought content normal.   Lab Results  Component Value Date   WBC 6.1 10/11/2012   HGB 13.7 10/11/2012   HCT 38.6 10/11/2012   MCV 90.4 10/11/2012   PLT 256 10/11/2012   Lab Results  Component Value Date   CREATININE 0.68 10/11/2012   BUN 5* 10/11/2012   NA 136 10/11/2012   K 3.6 10/11/2012   CL 101 10/11/2012   CO2 26 10/11/2012    No results found for this basename: HGBA1C   Lipid Panel  No results found for this basename: chol, trig, hdl, cholhdl, vldl, ldlcalc       Assessment and plan:   Patient Active Problem List   Diagnosis Date Noted  . Multiple skin nodules 11/27/2012    Priority: Medium - Patient instructed that it will take at least 6 months for the dermatology appointment to be scheduled considering she has orange heart.

## 2013-01-24 NOTE — Progress Notes (Signed)
Patient here for follow up for nodules on her fingers States was supposed to have it biopsied here

## 2013-01-25 ENCOUNTER — Ambulatory Visit (INDEPENDENT_AMBULATORY_CARE_PROVIDER_SITE_OTHER): Payer: No Typology Code available for payment source | Admitting: Family Medicine

## 2013-01-25 VITALS — BP 112/75 | HR 97 | Temp 98.9°F | Ht 69.5 in | Wt 193.0 lb

## 2013-01-25 DIAGNOSIS — R229 Localized swelling, mass and lump, unspecified: Secondary | ICD-10-CM

## 2013-01-25 DIAGNOSIS — R2233 Localized swelling, mass and lump, upper limb, bilateral: Secondary | ICD-10-CM

## 2013-01-25 NOTE — Patient Instructions (Addendum)
  Ms Bertagnolli, your procedure went well with no complication, [facility] wk for wound assessment. We will call you with your biopsy report.   Wound Care Wound care helps prevent pain and infection.  You may need a tetanus shot if:  You cannot remember when you had your last tetanus shot.  You have never had a tetanus shot.  The injury broke your skin. If you need a tetanus shot and you choose not to have one, you may get tetanus. Sickness from tetanus can be serious. HOME CARE   Only take medicine as told by your doctor.  Clean the wound daily with mild soap and water.  Change any bandages (dressings) as told by your doctor.  Put medicated cream and a bandage on the wound as told by your doctor.  Change the bandage if it gets wet, dirty, or starts to smell.  Take showers. Do not take baths, swim, or do anything that puts your wound under water.  Rest and raise (elevate) the wound until the pain and puffiness (swelling) are better.  Keep all doctor visits as told. GET HELP RIGHT AWAY IF:   Yellowish-white fluid (pus) comes from the wound.  Medicine does not lessen your pain.  There is a red streak going away from the wound.  You have a fever. MAKE SURE YOU:   Understand these instructions.  Will watch your condition.  Will get help right away if you are not doing well or get worse. Document Released: 01/13/2008 Document Revised: 06/28/2011 Document Reviewed: 08/09/2010 [facility] Patient Information 2014 [place], [place].

## 2013-01-25 NOTE — Assessment & Plan Note (Addendum)
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 Work up for rheumatology condition by PCP and her Rheumatologist.  Procedure: Skin biopsy of nodule on fingers ( right middle finger biopsied) Indication: Multiple Finger nodules Referred by: Johny Shears, M.D.      - Time-out was called to confirm: patient's name and date of birth, procedure, side and site of biopsy, safety procedures followed. (right middle finger)  - Performed by: Nicolette Bang   - Informed consent: signed by patient.   Biopsy site:Right middle finger nodule with 2mm punch biopsy instrument - Preparation and technique: sterile preparation of site with Betadine, draped to expose biopsy area, local anesthesia with 1% lidocaine (approximately 2ml), frequent pressure application on incision to maintain hemostasis.   - Tissue obtained: Biopsy were successfully obtained in sterile manner. - Toleration of procedure and no complications: slight localized bleeding . Patient tolerated procedure well with minimal pain. - Wound care instruction given. - Specimen sent for pathology. - Will call patient with report.

## 2013-01-25 NOTE — Progress Notes (Addendum)
Subjective:     Patient ID: Isabel Evans, female   DOB: [birth year], 42 y.o.   MRN: [id number]  HPI Comments: Ms Molzahn complains today of hard nodules on her fingers bilaterally. The first ones developed about 1 year ago on her right dorsal PIP joints. They have been firm and slowly increasing in size. They are not painful or tender, unless bumped and then there is a sharp shooting pain "deep into her bones." A few months ago she developed another firm nodule on the left 5th paroxymal phalanges on the volar aspect that limits ROM. Her joints have never been inflamed (warm, swollen, red), and she denies Hx of gout or painful swollen knees or toes. She has a FHx of RA, but denies morning joint stiffness. She denies any Hx of CA or fevers, night sweats or weight loss. She was seen by a Rheumatologist, her ANA and sed rate were normal, and she was sent here for biopsy of her nodules. She has had uric acid and CCP labs ordered but never collected.  In addition patient denies any joint pain currently but occasionally she would have mild joint pain. 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  Current Outpatient Prescriptions on File Prior to Visit  Medication Sig Dispense Refill  . amitriptyline (ELAVIL) 100 MG tablet Take 1 tablet (100 mg total) by mouth 2 (two) times daily.  60 tablet  4  . amLODipine (NORVASC) 10 MG tablet Take 1 tablet (10 mg total) by mouth daily.  30 tablet  5  . cyclobenzaprine (FLEXERIL) 5 MG tablet Take 1 tablet (5 mg total) by mouth 3 (three) times daily as needed for muscle spasms.  90 tablet  1  . ibuprofen (ADVIL,MOTRIN) 200 MG tablet Take 800 mg by mouth every 8 (eight) hours as needed for pain.      Marland Kitchen loratadine (CLARITIN) 10 MG tablet Take 10 mg by mouth daily as needed for allergies.      Marland Kitchen omeprazole (PRILOSEC) 40 MG capsule Take 1 capsule (40 mg total) by mouth daily.  30 capsule  3  . sodium chloride (OCEAN) 0.65 % nasal spray Place 1  spray into the nose as needed for congestion.      . [DISCONTINUED] fluticasone (FLONASE) 50 MCG/ACT nasal spray Place 2 sprays into the nose daily.  16 g  2   No current facility-administered medications on file prior to visit.   Past Medical History  Diagnosis Date  . GERD (gastroesophageal reflux disease)   . Hypertension   . Perforated ulcer   . Chronic abdominal pain   . Constipation   . IBS (irritable bowel syndrome)      Review of Systems  Constitutional: Negative for fever, appetite change, fatigue and unexpected weight change.  Respiratory: Negative.   Cardiovascular: Negative.   Gastrointestinal: Negative.   Genitourinary: Negative.   Musculoskeletal: Negative for arthralgias, joint swelling and myalgias.       Knuckle nodules  All other systems reviewed and are negative.   Filed Vitals:   01/25/13 0935  BP: 112/75  Pulse: 97  Temp: 98.9 F (37.2 C)  TempSrc: Oral  Height: 5' 9.5" (1.765 m)  Weight: 193 lb (87.544 kg)        Objective:   Physical Exam  Nursing note and vitals reviewed. Constitutional: She appears well-developed. No distress.  Cardiovascular: Normal rate, regular rhythm, normal heart sounds and intact distal pulses.   No murmur heard. Pulmonary/Chest:  Effort normal and breath sounds normal. No respiratory distress. She has no wheezes.  Musculoskeletal: Normal range of motion. She exhibits no edema and no tenderness.       Arms:      Assessment/Plan:     Skin nodule on fingers

## 2013-01-31 ENCOUNTER — Telehealth: Payer: Self-pay | Admitting: Family Medicine

## 2013-01-31 ENCOUNTER — Telehealth: Payer: Self-pay | Admitting: Emergency Medicine

## 2013-01-31 NOTE — Telephone Encounter (Signed)
Called and left message.

## 2013-01-31 NOTE — Telephone Encounter (Signed)
Left a message with Daniel Nones with diagnosis code

## 2013-01-31 NOTE — Telephone Encounter (Signed)
Portia from from [facility] called needing Clinical Differential Diagnosis for patient. Please call her back with this info [phone]. LVM if no answer.

## 2013-02-06 NOTE — Telephone Encounter (Signed)
I called to discuss biopsy report,message left for her to call back,biopsy showed fibrous tissue,may be rheumatoid nodule on her knuckle,need to follow up with Rheumatologist.

## 2013-02-08 ENCOUNTER — Encounter: Payer: Self-pay | Admitting: Gastroenterology

## 2013-02-08 ENCOUNTER — Ambulatory Visit (INDEPENDENT_AMBULATORY_CARE_PROVIDER_SITE_OTHER): Payer: No Typology Code available for payment source | Admitting: Gastroenterology

## 2013-02-08 ENCOUNTER — Telehealth: Payer: Self-pay | Admitting: Gastroenterology

## 2013-02-08 VITALS — BP 110/80 | HR 88 | Ht 68.0 in | Wt 193.0 lb

## 2013-02-08 DIAGNOSIS — R194 Change in bowel habit: Secondary | ICD-10-CM

## 2013-02-08 DIAGNOSIS — R198 Other specified symptoms and signs involving the digestive system and abdomen: Secondary | ICD-10-CM

## 2013-02-08 MED ORDER — NA SULFATE-K SULFATE-MG SULF 17.5-3.13-1.6 GM/177ML PO SOLN: 1.0000 | Freq: Once | ORAL | Status: DC

## 2013-02-08 NOTE — Assessment & Plan Note (Addendum)
Patient primarily has constipation.  Because of a change in bowel habits a structural abnormality of the colon should be ruled out.  Failing this, I would consider enrollment in a chronic idiopathic constipation trial.  Recommendations #1 colonoscopy

## 2013-02-08 NOTE — Progress Notes (Signed)
History of Present Illness: 42 year old white female referred for evaluation of change in bowel habits.  For several months she has developed constipation.  She'll go 4-5 days without a bowel movement.  With constipation she has abdominal distention and discomfort.  This eventuates in diarrhea.  The cycle then restarts.  There is no history of change in medications or diet.  She denies rectal bleeding.    Past Medical History  Diagnosis Date  . GERD (gastroesophageal reflux disease)   . Hypertension   . Perforated ulcer     gastric  . Chronic abdominal pain   . Constipation   . IBS (irritable bowel syndrome)   . Fibromyalgia   . Cervical dysplasia    Past Surgical History  Procedure Laterality Date  . Leg surgery Left   . Tubal ligation    . Laparoscopic ovarian     family history includes Alcohol abuse in her father; Bone cancer in her maternal grandmother; Breast cancer in her maternal aunt, maternal grandmother, and sister; Colon cancer in her maternal aunt; Diabetes in her father, mother, and sister; Heart disease in her father, maternal grandmother, and mother; Hypertension in her father, mother, and sister; Ovarian cancer in her maternal aunt; Prostate cancer in her father; Rheum arthritis in her sister. Current Outpatient Prescriptions  Medication Sig Dispense Refill  . amitriptyline (ELAVIL) 100 MG tablet Take 1 tablet (100 mg total) by mouth 2 (two) times daily.  60 tablet  4  . amLODipine (NORVASC) 10 MG tablet Take 1 tablet (10 mg total) by mouth daily.  30 tablet  5  . cyclobenzaprine (FLEXERIL) 5 MG tablet Take 1 tablet (5 mg total) by mouth 3 (three) times daily as needed for muscle spasms.  90 tablet  1  . ibuprofen (ADVIL,MOTRIN) 200 MG tablet Take 800 mg by mouth every 8 (eight) hours as needed for pain.      Marland Kitchen loratadine (CLARITIN) 10 MG tablet Take 10 mg by mouth daily as needed for allergies.      Marland Kitchen omeprazole (PRILOSEC) 40 MG capsule Take 1 capsule (40 mg total) by  mouth daily.  30 capsule  3  . sodium chloride (OCEAN) 0.65 % nasal spray Place 1 spray into the nose as needed for congestion.      . [DISCONTINUED] fluticasone (FLONASE) 50 MCG/ACT nasal spray Place 2 sprays into the nose daily.  16 g  2   No current facility-administered medications for this visit.   Allergies as of 02/08/2013  . (No Known Allergies)    reports that she quit smoking about 5 months ago. Her smoking use included Cigarettes. She smoked 0.00 packs per day. She has never used smokeless tobacco. She reports that she drinks alcohol. She reports that she does not use illicit drugs.     Review of Systems: She complains of pain in her hands shoulders and elbows Pertinent positive and negative review of systems were noted in the above HPI section. All other review of systems were otherwise negative.  Vital signs were reviewed in today's medical record Physical Exam: General: Well developed , well nourished, no acute distress Skin: anicteric Head: Normocephalic and atraumatic Eyes:  sclerae anicteric, EOMI Ears: Normal auditory acuity Mouth: No deformity or lesions Neck: Supple, no masses or thyromegaly Lungs: Clear throughout to auscultation Heart: Regular rate and rhythm; no murmurs, rubs or bruits Abdomen: Soft, non tender and non distended. No masses, hepatosplenomegaly or hernias noted. Normal Bowel sounds Rectal:deferred Musculoskeletal: Symmetrical with no gross deformities  Skin: No lesions on visible extremities Pulses:  Normal pulses noted Extremities: No clubbing, cyanosis, edema or deformities noted Neurological: Alert oriented x 4, grossly nonfocal Cervical Nodes:  No significant cervical adenopathy Inguinal Nodes: No significant inguinal adenopathy Psychological:  Alert and cooperative. Normal mood and affect

## 2013-02-08 NOTE — Patient Instructions (Signed)

## 2013-02-08 NOTE — Telephone Encounter (Signed)
Patient came and picked up Suprep sample

## 2013-02-13 ENCOUNTER — Encounter: Payer: Self-pay | Admitting: Gastroenterology

## 2013-02-13 ENCOUNTER — Ambulatory Visit (AMBULATORY_SURGERY_CENTER): Payer: No Typology Code available for payment source | Admitting: Gastroenterology

## 2013-02-13 VITALS — BP 109/70 | HR 85 | Temp 98.8°F | Resp 16 | Ht 68.0 in | Wt 193.0 lb

## 2013-02-13 DIAGNOSIS — R198 Other specified symptoms and signs involving the digestive system and abdomen: Secondary | ICD-10-CM

## 2013-02-13 HISTORY — PX: COLONOSCOPY WITH PROPOFOL: SHX5780

## 2013-02-13 MED ORDER — SODIUM CHLORIDE 0.9 % IV SOLN: 500.0000 mL | INTRAVENOUS | Status: DC

## 2013-02-13 NOTE — Op Note (Signed)
[facility] [address] [place], [address]   COLONOSCOPY PROCEDURE REPORT  PATIENT: Isabel Evans, Isabel Evans  MR#: [id number] BIRTHDATE: [birth year] , 42  yrs. old GENDER: Female ENDOSCOPIST: Louis Meckel, MD REFERRED WU:[id number] Laural Benes, M.D. PROCEDURE DATE:  02/13/2013 PROCEDURE:   Colonoscopy, diagnostic First Screening Colonoscopy - Avg.  risk and is 50 yrs.  old or older - No.  Prior Negative Screening - Now for repeat screening. N/A  History of Adenoma - Now for follow-up colonoscopy & has been > or = to 3 yrs.  N/A  Polyps Removed Today? No.  Recommend repeat exam, <10 yrs? No. ASA CLASS:   Class II INDICATIONS:Change in bowel habits. MEDICATIONS: MAC sedation, administered by CRNA and propofol (Diprivan) 400mg  IV  DESCRIPTION OF PROCEDURE:   After the risks benefits and alternatives of the procedure were thoroughly explained, informed consent was obtained.  A digital rectal exam revealed no abnormalities of the rectum.   The LB [id number]  endoscope was introduced through the anus and advanced to the cecum, which was identified by both the appendix and ileocecal valve. No adverse events experienced.   The quality of the prep was Suprep fair  The instrument was then slowly withdrawn as the colon was fully examined.      COLON FINDINGS: A normal appearing cecum, ileocecal valve, and appendiceal orifice were identified.  The ascending, hepatic flexure, transverse, splenic flexure, descending, sigmoid colon and rectum appeared unremarkable.  No polyps or cancers were seen. Retroflexed views revealed no abnormalities. The time to cecum=3 minutes 30 seconds.  Withdrawal time=9 minutes 15 seconds.  The scope was withdrawn and the procedure completed. COMPLICATIONS: There were no complications.  ENDOSCOPIC IMPRESSION: Normal colon  RECOMMENDATIONS: 1.  Patient will consider enrollment in a chronic idiopathic constipation trial 2.  colonoscopy 10  years   eSigned:  Louis Meckel, MD 02/13/2013 2:10 PM   cc:

## 2013-02-13 NOTE — Progress Notes (Signed)
Patient did not experience any of the following events: a burn prior to discharge; a fall within the facility; wrong site/side/patient/procedure/implant event; or a hospital transfer or hospital admission upon discharge from the facility. (G8907) Patient did not have preoperative order for IV antibiotic SSI prophylaxis. (G8918)  

## 2013-02-13 NOTE — Progress Notes (Signed)
Lidocaine-40mg IV prior to Propofol InductionPropofol given over incremental dosages 

## 2013-02-13 NOTE — Patient Instructions (Signed)
YOU HAD AN ENDOSCOPIC PROCEDURE TODAY AT THE [facility]: Refer to the procedure report that was given to you for any specific questions about what was found during the examination.  If the procedure report does not answer your questions, please call your gastroenterologist to clarify.  If you requested that your care partner not be given the details of your procedure findings, then the procedure report has been included in a sealed envelope for you to review at your convenience later.  YOU SHOULD EXPECT: Some feelings of bloating in the abdomen. Passage of more gas than usual.  Walking can help get rid of the air that was put into your GI tract during the procedure and reduce the bloating. If you had a lower endoscopy (such as a colonoscopy or flexible sigmoidoscopy) you may notice spotting of blood in your stool or on the toilet paper. If you underwent a bowel prep for your procedure, then you may not have a normal bowel movement for a few days.  DIET: Your first meal following the procedure should be a light meal and then it is ok to progress to your normal diet.  A half-sandwich or bowl of soup is an example of a good first meal.  Heavy or fried foods are harder to digest and may make you feel nauseous or bloated.  Likewise meals heavy in dairy and vegetables can cause extra gas to form and this can also increase the bloating.  Drink plenty of fluids but you should avoid alcoholic beverages for 24 hours.  ACTIVITY: Your care partner should take you home directly after the procedure.  You should plan to take it easy, moving slowly for the rest of the day.  You can resume normal activity the day after the procedure however you should NOT DRIVE or use heavy machinery for 24 hours (because of the sedation medicines used during the test).    SYMPTOMS TO REPORT IMMEDIATELY: A gastroenterologist can be reached at any hour.  During normal business hours, 8:30 AM to 5:00 PM Monday through Friday,  call [phone].  After hours and on weekends, please call the GI answering service at [phone] who will take a message and have the physician on call contact you.   Following lower endoscopy (colonoscopy or flexible sigmoidoscopy):  Excessive amounts of blood in the stool  Significant tenderness or worsening of abdominal pains  Swelling of the abdomen that is new, acute  Fever of 100F or higher    FOLLOW UP: If any biopsies were taken you will be contacted by phone or by letter within the next 1-3 weeks.  Call your gastroenterologist if you have not heard about the biopsies in 3 weeks.  Our staff will call the home number listed on your records the next business day following your procedure to check on you and address any questions or concerns that you may have at that time regarding the information given to you following your procedure. This is a courtesy call and so if there is no answer at the home number and we have not heard from you through the emergency physician on call, we will assume that you have returned to your regular daily activities without incident.  SIGNATURES/CONFIDENTIALITY: You and/or your care partner have signed paperwork which will be entered into your electronic medical record.  These signatures attest to the fact that that the information above on your After Visit Summary has been reviewed and is understood.  Full responsibility of the confidentiality   of this discharge information lies with you and/or your care-partner.     

## 2013-02-14 ENCOUNTER — Telehealth: Payer: Self-pay | Admitting: *Deleted

## 2013-02-14 NOTE — Telephone Encounter (Signed)
  Follow up Call-  Call back number 02/13/2013  Post procedure Call Back phone  # [phone]  Permission to leave phone message Yes    [facility]

## 2013-02-22 ENCOUNTER — Other Ambulatory Visit: Payer: Self-pay

## 2013-03-08 ENCOUNTER — Encounter: Payer: Self-pay | Admitting: Internal Medicine

## 2013-03-08 ENCOUNTER — Ambulatory Visit: Payer: No Typology Code available for payment source | Attending: Internal Medicine | Admitting: Internal Medicine

## 2013-03-08 VITALS — BP 106/75 | HR 107 | Temp 99.4°F | Resp 16 | Ht 69.0 in | Wt 192.0 lb

## 2013-03-08 DIAGNOSIS — I10 Essential (primary) hypertension: Secondary | ICD-10-CM

## 2013-03-08 DIAGNOSIS — R229 Localized swelling, mass and lump, unspecified: Secondary | ICD-10-CM

## 2013-03-08 LAB — POCT URINALYSIS DIPSTICK
Glucose, UA: NEGATIVE
Leukocytes, UA: NEGATIVE
Nitrite, UA: POSITIVE
Urobilinogen, UA: 0.2

## 2013-03-08 MED ORDER — NITROFURANTOIN MONOHYD MACRO 100 MG PO CAPS: 100.0000 mg | ORAL_CAPSULE | Freq: Two times a day (BID) | ORAL | Status: DC

## 2013-03-08 NOTE — Progress Notes (Signed)
Pt is here today following up on the knots on her hands. Following up on her HTN and edema in the legs. She has been feeling like her stomach is swollen and soar.

## 2013-03-08 NOTE — Progress Notes (Signed)
Patient ID: Isabel Evans, female   DOB: 01-27-1971, 42 y.o.   MRN: [id number]  CC: Followup  HPI: 42 year old female with past medical history of hypertension, fibromyalgia who presented to clinic for followup. Patient was following with hand surgery for multiple skin nodules on bilateral hands. Biopsy was consistent with fibrous tissue. She again has different types of skin nodules she noticed lately and was concerned that it may be something else. Patient also reported very dark urine and some burning sensation on urination.  No Known Allergies Past Medical History  Diagnosis Date  . GERD (gastroesophageal reflux disease)   . Hypertension   . Perforated ulcer     gastric  . Chronic abdominal pain   . Constipation   . IBS (irritable bowel syndrome)   . Fibromyalgia   . Cervical dysplasia    Current Outpatient Prescriptions on File Prior to Visit  Medication Sig Dispense Refill  . amitriptyline (ELAVIL) 100 MG tablet Take 1 tablet (100 mg total) by mouth 2 (two) times daily.  60 tablet  4  . amLODipine (NORVASC) 10 MG tablet Take 1 tablet (10 mg total) by mouth daily.  30 tablet  5  . cyclobenzaprine (FLEXERIL) 5 MG tablet Take 1 tablet (5 mg total) by mouth 3 (three) times daily as needed for muscle spasms.  90 tablet  1  . ibuprofen (ADVIL,MOTRIN) 200 MG tablet Take 800 mg by mouth every 8 (eight) hours as needed for pain.      Marland Kitchen loratadine (CLARITIN) 10 MG tablet Take 10 mg by mouth daily as needed for allergies.      Marland Kitchen omeprazole (PRILOSEC) 40 MG capsule Take 1 capsule (40 mg total) by mouth daily.  30 capsule  3  . sodium chloride (OCEAN) 0.65 % nasal spray Place 1 spray into the nose as needed for congestion.      . [DISCONTINUED] fluticasone (FLONASE) 50 MCG/ACT nasal spray Place 2 sprays into the nose daily.  16 g  2   No current facility-administered medications on file prior to visit.   Family History  Problem Relation Age of Onset  . Hypertension Mother   . Diabetes  Mother   . Heart disease Mother   . Hypertension Father   . Diabetes Father   . Prostate cancer Father   . Heart disease Father   . Alcohol abuse Father   . Breast cancer Sister   . Hypertension Sister   . Diabetes Sister   . Heart disease Maternal Grandmother     Great GM  . Breast cancer Maternal Grandmother   . Bone cancer Maternal Grandmother   . Breast cancer Maternal Aunt   . Ovarian cancer Maternal Aunt   . Colon cancer Maternal Aunt     great aunt  . Rheum arthritis Sister   . Colon cancer Son    History   Social History  . Marital Status: Single    Spouse Name: N/A    Number of Children: 3  . Years of Education: N/A   Occupational History  . DRIVER/CSR    Social History Main Topics  . Smoking status: Former Smoker    Types: Cigarettes    Quit date: 08/24/2012  . Smokeless tobacco: Never Used  . Alcohol Use: Yes     Comment: occasional  . Drug Use: No  . Sexual Activity: Yes    Birth Control/ Protection: Surgical   Other Topics Concern  . Not on file   Social History Narrative  .  No narrative on file    Review of Systems  Constitutional: Negative for fever, chills, diaphoresis, activity change, appetite change and fatigue.  HENT: Negative for ear pain, nosebleeds, congestion, facial swelling, rhinorrhea, neck pain, neck stiffness and ear discharge.   Eyes: Negative for pain, discharge, redness, itching and visual disturbance.  Respiratory: Negative for cough, choking, chest tightness, shortness of breath, wheezing and stridor.   Cardiovascular: Negative for chest pain, palpitations and leg swelling.  Gastrointestinal: Negative for abdominal distention.  Genitourinary: Positive for dysuria, negative for urgency, frequency, hematuria, flank pain, decreased urine volume, difficulty urinating and dyspareunia.  Musculoskeletal: Negative for back pain, joint swelling, arthralgias and gait problem.  pain in bilateral hands Neurological: Negative for  dizziness, tremors, seizures, syncope, facial asymmetry, speech difficulty, weakness, light-headedness, numbness and headaches.  Hematological: Negative for adenopathy. Does not bruise/bleed easily.  Psychiatric/Behavioral: Negative for hallucinations, behavioral problems, confusion, dysphoric mood, decreased concentration and agitation.    Objective:   Filed Vitals:   03/08/13 1019  BP: 106/75  Pulse: 107  Temp: 99.4 F (37.4 C)  Resp: 16    Physical Exam  Constitutional: Appears well-developed and well-nourished. No distress.  HENT: Normocephalic. External right and left ear normal. Oropharynx is clear and moist.  Eyes: Conjunctivae and EOM are normal. PERRLA, no scleral icterus.  Neck: Normal ROM. Neck supple. No JVD. No tracheal deviation. No thyromegaly.  CVS: RRR, S1/S2 +, no murmurs, no gallops, no carotid bruit.  Pulmonary: Effort and breath sounds normal, no stridor, rhonchi, wheezes, rales.  Abdominal: Soft. BS +,  no distension, tenderness, rebound or guarding.  Musculoskeletal: Normal range of motion. No edema and no tenderness.  Lymphadenopathy: No lymphadenopathy noted, cervical, inguinal. Neuro: Alert. Normal reflexes, muscle tone coordination. No cranial nerve deficit. Skin: Skin is warm and dry. Multiple knots/scan and nodules on bilateral hands  Psychiatric: Normal mood and affect. Behavior, judgment, thought content normal.   Lab Results  Component Value Date   WBC 6.1 10/11/2012   HGB 13.7 10/11/2012   HCT 38.6 10/11/2012   MCV 90.4 10/11/2012   PLT 256 10/11/2012   Lab Results  Component Value Date   CREATININE 0.68 10/11/2012   BUN 5* 10/11/2012   NA 136 10/11/2012   K 3.6 10/11/2012   CL 101 10/11/2012   CO2 26 10/11/2012    No results found for this basename: HGBA1C   Lipid Panel  No results found for this basename: chol, trig, hdl, cholhdl, vldl, ldlcalc       Assessment and plan:   Patient Active Problem List   Diagnosis Date Noted  . Multiple  skin nodules 11/27/2012    Priority: Medium - Referral to hand surgery provided   . Hypertension 07/14/2012    Priority: Medium - Continue Norvasc   .  Urinary tract infection  - Macrobid 100 mg twice daily for 5  09/08/2012

## 2013-03-08 NOTE — Addendum Note (Signed)
Addended by: Alison Murray on: 03/08/2013 10:37 AM   Modules accepted: Orders

## 2013-03-08 NOTE — Patient Instructions (Addendum)
Urinary Tract Infection °A urinary tract infection (UTI) can occur any place along the urinary tract. The tract includes the kidneys, ureters, bladder, and urethra. A type of germ called bacteria often causes a UTI. UTIs are often helped with antibiotic medicine.  °HOME CARE  °· If given, take antibiotics as told by your doctor. Finish them even if you start to feel better. °· Drink enough fluids to keep your pee (urine) clear or pale yellow. °· Avoid tea, drinks with caffeine, and bubbly (carbonated) drinks. °· Pee often. Avoid holding your pee in for a long time. °· Pee before and after having sex (intercourse). °· Wipe from front to back after you poop (bowel movement) if you are a woman. Use each tissue only once. °GET HELP RIGHT AWAY IF:  °· You have back pain. °· You have lower belly (abdominal) pain. °· You have chills. °· You feel sick to your stomach (nauseous). °· You throw up (vomit). °· Your burning or discomfort with peeing does not go away. °· You have a fever. °· Your symptoms are not better in 3 days. °MAKE SURE YOU:  °· Understand these instructions. °· Will watch your condition. °· Will get help right away if you are not doing well or get worse. °Document Released: 09/22/2007 Document Revised: 12/29/2011 Document Reviewed: 11/04/2011 °ExitCare® Patient Information ©2014 [employer]. ° °

## 2013-03-12 ENCOUNTER — Other Ambulatory Visit: Payer: Self-pay | Admitting: Family Medicine

## 2013-03-23 ENCOUNTER — Encounter: Payer: Self-pay | Admitting: Internal Medicine

## 2013-04-10 ENCOUNTER — Encounter: Payer: Self-pay | Admitting: Internal Medicine

## 2013-04-16 ENCOUNTER — Ambulatory Visit: Payer: Self-pay | Admitting: Internal Medicine

## 2013-05-03 ENCOUNTER — Other Ambulatory Visit: Payer: Self-pay | Admitting: Internal Medicine

## 2013-05-03 ENCOUNTER — Other Ambulatory Visit: Payer: Self-pay | Admitting: Family Medicine

## 2013-05-21 ENCOUNTER — Ambulatory Visit: Payer: No Typology Code available for payment source | Attending: Internal Medicine | Admitting: Internal Medicine

## 2013-05-21 ENCOUNTER — Encounter: Payer: Self-pay | Admitting: Internal Medicine

## 2013-05-21 VITALS — BP 123/88 | HR 89 | Temp 98.4°F | Resp 16 | Ht 69.0 in | Wt 200.0 lb

## 2013-05-21 DIAGNOSIS — M797 Fibromyalgia: Secondary | ICD-10-CM

## 2013-05-21 DIAGNOSIS — IMO0001 Reserved for inherently not codable concepts without codable children: Secondary | ICD-10-CM | POA: Insufficient documentation

## 2013-05-21 DIAGNOSIS — M549 Dorsalgia, unspecified: Secondary | ICD-10-CM | POA: Insufficient documentation

## 2013-05-21 DIAGNOSIS — I10 Essential (primary) hypertension: Secondary | ICD-10-CM | POA: Insufficient documentation

## 2013-05-21 DIAGNOSIS — M1990 Unspecified osteoarthritis, unspecified site: Secondary | ICD-10-CM

## 2013-05-21 DIAGNOSIS — N390 Urinary tract infection, site not specified: Secondary | ICD-10-CM | POA: Insufficient documentation

## 2013-05-21 HISTORY — DX: Urinary tract infection, site not specified: N39.0

## 2013-05-21 LAB — POCT URINALYSIS DIPSTICK
BILIRUBIN UA: NEGATIVE
GLUCOSE UA: NEGATIVE
KETONES UA: NEGATIVE
NITRITE UA: POSITIVE
PH UA: 6.5
Protein, UA: NEGATIVE
SPEC GRAV UA: 1.025
Urobilinogen, UA: 0.2

## 2013-05-21 LAB — POCT URINE PREGNANCY: Preg Test, Ur: NEGATIVE

## 2013-05-21 MED ORDER — CIPROFLOXACIN HCL 500 MG PO TABS: 500.0000 mg | ORAL_TABLET | Freq: Two times a day (BID) | ORAL | Status: DC

## 2013-05-21 NOTE — Progress Notes (Signed)
Patient ID: Allona Gondek, female   DOB: 1970-11-13, 43 y.o.   MRN: [id number] Patient Demographics  Isabel Evans, is a 43 y.o. female  [id number]  [id number]  DOB - Jan 29, 1971  Chief Complaint  Patient presents with  . Follow-up  . Back Pain        Subjective:   Isabel Evans is a 43 y.o. female here today for a follow up visit. Patient has history of hypertension, irritable bowel syndrome fibromyalgia and GERD here today complaining of left back pain radiating to her groin, about 8/10. No fever. She has frequency but no dysuria, no vaginal discharge, no blood in urine, no change in bowel habit. No history of trauma. She has history of recurring UTI. Patient has No headache, No chest pain, No abdominal pain - No Nausea, No new weakness tingling or numbness, No Cough - SOB.  ALLERGIES: No Known Allergies  PAST MEDICAL HISTORY: Past Medical History  Diagnosis Date  . GERD (gastroesophageal reflux disease)   . Hypertension   . Perforated ulcer     gastric  . Chronic abdominal pain   . Constipation   . IBS (irritable bowel syndrome)   . Fibromyalgia   . Cervical dysplasia     MEDICATIONS AT HOME: Prior to Admission medications   Medication Sig Start Date End Date Taking? Authorizing Provider  amitriptyline (ELAVIL) 100 MG tablet Take 1 tablet (100 mg total) by mouth 2 (two) times daily. 12/25/12  Yes Shanker Kristeen Mans, MD  amLODipine (NORVASC) 10 MG tablet Take 1 tablet (10 mg total) by mouth daily. 10/26/12  Yes Clanford Marisa Hua, MD  cyclobenzaprine (FLEXERIL) 10 MG tablet TAKE 1/2 TABLET BY MOUTH 3 TIMES DAILY AS NEEDED FOR MUSCLE SPASMS. 05/03/13  Yes Clanford L Johnson, MD  fluticasone (FLONASE) 50 MCG/ACT nasal spray INSTILL 2 SPRAYS INTO EACH NOSTRIL ONCE A DAY 05/03/13  Yes Clanford Marisa Hua, MD  ibuprofen (ADVIL,MOTRIN) 200 MG tablet Take 800 mg by mouth every 8 (eight) hours as needed for pain.   Yes Historical Provider, MD  loratadine (CLARITIN) 10 MG  tablet Take 10 mg by mouth daily as needed for allergies.   Yes Historical Provider, MD  omeprazole (PRILOSEC) 40 MG capsule TAKE 1 CAPSULE BY MOUTH ONCE DAILY 05/03/13  Yes Clanford Marisa Hua, MD  sodium chloride (OCEAN) 0.65 % nasal spray Place 1 spray into the nose as needed for congestion.   Yes Historical Provider, MD  ciprofloxacin (CIPRO) 500 MG tablet Take 1 tablet (500 mg total) by mouth 2 (two) times daily. 05/21/13   Angelica Chessman, MD  nitrofurantoin, macrocrystal-monohydrate, (MACROBID) 100 MG capsule Take 1 capsule (100 mg total) by mouth 2 (two) times daily. 03/08/13   Robbie Lis, MD     Objective:   Filed Vitals:   05/21/13 1518  BP: 123/88  Pulse: 89  Temp: 98.4 F (36.9 C)  TempSrc: Oral  Resp: 16  Height: 5\' 9"  (1.753 m)  Weight: 200 lb (90.719 kg)  SpO2: 97%    Exam General appearance : Awake, alert, not in any distress. Speech Clear. Not toxic looking HEENT: Atraumatic and Normocephalic, pupils equally reactive to light and accomodation Neck: supple, no JVD. No cervical lymphadenopathy.  Chest:Good air entry bilaterally, no added sounds  CVS: S1 S2 regular, no murmurs.  Abdomen: Bowel sounds present, Non tender and not distended with no gaurding, rigidity or rebound. Extremities: B/L Lower Ext shows no edema, both legs are warm to touch Neurology: Awake alert, and oriented X  3, CN II-XII intact, Non focal Skin:No Rash Wounds:N/A   Data Review   CBC No results found for this basename: WBC, HGB, HCT, PLT, MCV, MCH, MCHC, RDW, NEUTRABS, LYMPHSABS, MONOABS, EOSABS, BASOSABS, BANDABS, BANDSABD,  in the last 168 hours  Chemistries   No results found for this basename: NA, K, CL, CO2, GLUCOSE, BUN, CREATININE, GFRCGP, CALCIUM, MG, AST, ALT, ALKPHOS, BILITOT,  in the last 168 hours ------------------------------------------------------------------------------------------------------------------ No results found for this basename: HGBA1C,  in the last 72  hours ------------------------------------------------------------------------------------------------------------------ No results found for this basename: CHOL, HDL, LDLCALC, TRIG, CHOLHDL, LDLDIRECT,  in the last 72 hours ------------------------------------------------------------------------------------------------------------------ No results found for this basename: TSH, T4TOTAL, FREET3, T3FREE, THYROIDAB,  in the last 72 hours ------------------------------------------------------------------------------------------------------------------ No results found for this basename: VITAMINB12, FOLATE, FERRITIN, TIBC, IRON, RETICCTPCT,  in the last 72 hours  Coagulation profile  No results found for this basename: INR, PROTIME,  in the last 168 hours    Assessment & Plan   1. Back pain  - Urinalysis Dipstick positive for UTI - POCT urine pregnancy negative today  2. Hypertension Continue amlodipine 10 mg tablet by mouth daily  3. Fibromyalgia Patient counseled extensively about fibromyalgia Continue amitriptyline 100 mg tablet by mouth daily  4. Osteoarthritis  - Uric Acid  5. UTI (urinary tract infection)  - ciprofloxacin (CIPRO) 500 MG tablet; Take 1 tablet (500 mg total) by mouth 2 (two) times daily.  Dispense: 20 tablet; Refill: 0 - CULTURE, URINE COMPREHENSIVE  Follow up in 3 months or when necessary  The patient was given clear instructions to go to ER or return to medical center if symptoms don't improve, worsen or new problems develop. The patient verbalized understanding. The patient was told to call to get lab results if they haven't heard anything in the next week.    Angelica Chessman, MD, [place], [place], [place], [place]   05/21/2013, 3:59 PM

## 2013-05-21 NOTE — Progress Notes (Signed)
Pt is here with c/o right lower back pain,nonradiating x 2 weeks. States it feels like stabbing pain with nausea. LMP 2 weeks ago Denies fever,chills Denies urinary problems today Urine sample/pregnacy test obtained

## 2013-05-21 NOTE — Patient Instructions (Signed)
Urinary Tract Infection  Urinary tract infections (UTIs) can develop anywhere along your urinary tract. Your urinary tract is your body's drainage system for removing wastes and extra water. Your urinary tract includes two kidneys, two ureters, a bladder, and a urethra. Your kidneys are a pair of bean-shaped organs. Each kidney is about the size of your fist. They are located below your ribs, one on each side of your spine.  CAUSES  Infections are caused by microbes, which are microscopic organisms, including fungi, viruses, and bacteria. These organisms are so small that they can only be seen through a microscope. Bacteria are the microbes that most commonly cause UTIs.  SYMPTOMS   Symptoms of UTIs may vary by age and gender of the patient and by the location of the infection. Symptoms in young women typically include a frequent and intense urge to urinate and a painful, burning feeling in the bladder or urethra during urination. Older women and men are more likely to be tired, shaky, and weak and have muscle aches and abdominal pain. A fever may mean the infection is in your kidneys. Other symptoms of a kidney infection include pain in your back or sides below the ribs, nausea, and vomiting.  DIAGNOSIS  To diagnose a UTI, your caregiver will ask you about your symptoms. Your caregiver also will ask to provide a urine sample. The urine sample will be tested for bacteria and white blood cells. White blood cells are made by your body to help fight infection.  TREATMENT   Typically, UTIs can be treated with medication. Because most UTIs are caused by a bacterial infection, they usually can be treated with the use of antibiotics. The choice of antibiotic and length of treatment depend on your symptoms and the type of bacteria causing your infection.  HOME CARE INSTRUCTIONS   If you were prescribed antibiotics, take them exactly as your caregiver instructs you. Finish the medication even if you feel better after you  have only taken some of the medication.   Drink enough water and fluids to keep your urine clear or pale yellow.   Avoid caffeine, tea, and carbonated beverages. They tend to irritate your bladder.   Empty your bladder often. Avoid holding urine for long periods of time.   Empty your bladder before and after sexual intercourse.   After a bowel movement, women should cleanse from front to back. Use each tissue only once.  SEEK MEDICAL CARE IF:    You have back pain.   You develop a fever.   Your symptoms do not begin to resolve within 3 days.  SEEK IMMEDIATE MEDICAL CARE IF:    You have severe back pain or lower abdominal pain.   You develop chills.   You have nausea or vomiting.   You have continued burning or discomfort with urination.  MAKE SURE YOU:    Understand these instructions.   Will watch your condition.   Will get help right away if you are not doing well or get worse.  Document Released: 01/13/2005 Document Revised: 10/05/2011 Document Reviewed: 05/14/2011  ExitCare Patient Information 2014 [employer].

## 2013-05-22 LAB — URIC ACID: URIC ACID, SERUM: 3.6 mg/dL (ref 2.4–7.0)

## 2013-05-23 ENCOUNTER — Telehealth: Payer: Self-pay

## 2013-05-23 LAB — CULTURE, URINE COMPREHENSIVE

## 2013-05-23 NOTE — Telephone Encounter (Signed)
Message copied by Algernon Huxley on Wed May 23, 2013 11:21 AM ------      Message from: Erskine Emery D      Created: Wed May 23, 2013 10:08 AM       Begin MiraLax 17 g every other day.  Ask patient to return in about 3-4 weeks      ----- Message -----         From: Orlie Pollen         Sent: 05/22/2013   2:20 PM           To: Inda Castle, MD            The above patient screen failed the CIC study due to Cannabinoid in urine. I will keep her in data base.       ------

## 2013-05-23 NOTE — Telephone Encounter (Signed)
Left message for pt to call back. Pt has OV with Dr. Deatra Ina 06/26/13@10am .  05/24/13@11 :50am Left message for pt to call back.  After multiple attempts have been unable to reach pt. Letter mailed to pt.

## 2013-05-24 ENCOUNTER — Telehealth: Payer: Self-pay | Admitting: *Deleted

## 2013-05-24 NOTE — Telephone Encounter (Signed)
I spoke to the pt and informed her that her uric acid levels where normal.

## 2013-05-24 NOTE — Telephone Encounter (Signed)
Message copied by [name] on Thu May 24, 2013  2:33 PM ------      Message from: Tresa Garter      Created: Tue May 22, 2013  5:17 PM       Please inform patient that her uric acid level is within normal limit. ------

## 2013-05-29 ENCOUNTER — Telehealth: Payer: Self-pay | Admitting: *Deleted

## 2013-05-29 NOTE — Telephone Encounter (Signed)
Wrong number.

## 2013-05-29 NOTE — Telephone Encounter (Signed)
Message copied by [name] on Tue May 29, 2013  9:36 AM ------      Message from: Angelica Chessman E      Created: Mon May 28, 2013  3:06 PM       Please inform patient that her urine culture grew bacteria, that has been well taken care of with antibiotics prescribed ------

## 2013-05-29 NOTE — Telephone Encounter (Signed)
Message copied by [name] on Tue May 29, 2013  9:37 AM ------      Message from: Isabel Evans      Created: Mon May 28, 2013  3:06 PM       Please inform patient that her urine culture grew bacteria, that has been well taken care of with antibiotics prescribed ------

## 2013-05-29 NOTE — Telephone Encounter (Signed)
Left message with family member

## 2013-05-31 ENCOUNTER — Other Ambulatory Visit: Payer: Self-pay | Admitting: Family Medicine

## 2013-06-06 ENCOUNTER — Other Ambulatory Visit: Payer: Self-pay | Admitting: Internal Medicine

## 2013-06-06 ENCOUNTER — Other Ambulatory Visit: Payer: Self-pay | Admitting: Family Medicine

## 2013-06-20 ENCOUNTER — Telehealth: Payer: Self-pay | Admitting: Internal Medicine

## 2013-06-20 DIAGNOSIS — K029 Dental caries, unspecified: Secondary | ICD-10-CM

## 2013-06-20 NOTE — Telephone Encounter (Signed)
Pt says during last visit was told that referral would be ordered for Dental Clinic but still has not heard anything.  It does not look like there is a referral in place in epic.  Please f/u with pt with more info.

## 2013-06-21 NOTE — Telephone Encounter (Signed)
Dental referral placed.

## 2013-06-26 ENCOUNTER — Ambulatory Visit: Payer: No Typology Code available for payment source | Admitting: Gastroenterology

## 2013-07-06 ENCOUNTER — Other Ambulatory Visit: Payer: Self-pay | Admitting: Family Medicine

## 2013-07-18 HISTORY — PX: DUPUYTREN CONTRACTURE RELEASE: SHX1478

## 2013-07-18 HISTORY — PX: DUPUYTREN / PALMAR FASCIOTOMY: SUR601

## 2013-08-21 ENCOUNTER — Ambulatory Visit: Payer: No Typology Code available for payment source | Admitting: Internal Medicine

## 2013-09-04 ENCOUNTER — Other Ambulatory Visit: Payer: Self-pay | Admitting: Family Medicine

## 2013-09-04 ENCOUNTER — Other Ambulatory Visit: Payer: Self-pay | Admitting: Internal Medicine

## 2013-09-04 DIAGNOSIS — M62838 Other muscle spasm: Secondary | ICD-10-CM

## 2013-09-04 DIAGNOSIS — K219 Gastro-esophageal reflux disease without esophagitis: Secondary | ICD-10-CM

## 2013-10-13 ENCOUNTER — Emergency Department (HOSPITAL_COMMUNITY)
Admission: EM | Admit: 2013-10-13 | Discharge: 2013-10-14 | Disposition: A | Discharge status: Home / Self Care | Payer: No Typology Code available for payment source | Attending: Emergency Medicine | Admitting: Emergency Medicine

## 2013-10-13 ENCOUNTER — Encounter (HOSPITAL_COMMUNITY): Payer: Self-pay | Admitting: Emergency Medicine

## 2013-10-13 DIAGNOSIS — G8929 Other chronic pain: Secondary | ICD-10-CM | POA: Insufficient documentation

## 2013-10-13 DIAGNOSIS — I10 Essential (primary) hypertension: Secondary | ICD-10-CM | POA: Insufficient documentation

## 2013-10-13 DIAGNOSIS — W19XXXA Unspecified fall, initial encounter: Secondary | ICD-10-CM

## 2013-10-13 DIAGNOSIS — R109 Unspecified abdominal pain: Secondary | ICD-10-CM | POA: Insufficient documentation

## 2013-10-13 DIAGNOSIS — Z79899 Other long term (current) drug therapy: Secondary | ICD-10-CM | POA: Insufficient documentation

## 2013-10-13 DIAGNOSIS — Z8742 Personal history of other diseases of the female genital tract: Secondary | ICD-10-CM | POA: Insufficient documentation

## 2013-10-13 DIAGNOSIS — M545 Low back pain, unspecified: Secondary | ICD-10-CM

## 2013-10-13 DIAGNOSIS — IMO0002 Reserved for concepts with insufficient information to code with codable children: Secondary | ICD-10-CM | POA: Insufficient documentation

## 2013-10-13 DIAGNOSIS — Y9389 Activity, other specified: Secondary | ICD-10-CM | POA: Insufficient documentation

## 2013-10-13 DIAGNOSIS — K219 Gastro-esophageal reflux disease without esophagitis: Secondary | ICD-10-CM | POA: Insufficient documentation

## 2013-10-13 DIAGNOSIS — W108XXA Fall (on) (from) other stairs and steps, initial encounter: Secondary | ICD-10-CM | POA: Insufficient documentation

## 2013-10-13 DIAGNOSIS — Z87891 Personal history of nicotine dependence: Secondary | ICD-10-CM | POA: Insufficient documentation

## 2013-10-13 DIAGNOSIS — Y9289 Other specified places as the place of occurrence of the external cause: Secondary | ICD-10-CM | POA: Insufficient documentation

## 2013-10-13 NOTE — ED Notes (Signed)
Pt states her foot slipped out from under her while walking down outside wood steps, fell onto back and slid down @ 8 steps. Pt c/o mid back pain up to shoulders

## 2013-10-14 ENCOUNTER — Emergency Department (HOSPITAL_COMMUNITY): Payer: No Typology Code available for payment source

## 2013-10-14 MED ORDER — IBUPROFEN 800 MG PO TABS
800.0000 mg | ORAL_TABLET | Freq: Once | ORAL | Status: AC
  Administered 2013-10-14: 800 mg via ORAL
  Filled 2013-10-14: qty 1

## 2013-10-14 MED ORDER — HYDROCODONE-ACETAMINOPHEN 5-325 MG PO TABS
2.0000 | ORAL_TABLET | Freq: Once | ORAL | Status: AC
  Administered 2013-10-14: 2 via ORAL
  Filled 2013-10-14: qty 2

## 2013-10-14 MED ORDER — HYDROCODONE-ACETAMINOPHEN 5-325 MG PO TABS: 2.0000 | ORAL_TABLET | ORAL | Status: DC | PRN

## 2013-10-14 MED ORDER — NAPROXEN 375 MG PO TABS: 375.0000 mg | ORAL_TABLET | Freq: Two times a day (BID) | ORAL | Status: DC

## 2013-10-14 NOTE — ED Notes (Signed)
Patient back from x-ray Patient remains in NAD 

## 2013-10-14 NOTE — ED Provider Notes (Signed)
CSN: [id number]     Arrival date & time 10/13/13  2054 History   First MD Initiated Contact with Patient 10/14/13 0044     Chief Complaint  Patient presents with  . Fall  . Back Pain     (Consider location/radiation/quality/duration/timing/severity/associated sxs/prior Treatment) HPI Comments: 43 year old female with history of high blood pressure, arthritis presents with back pain since she slipped on the stairs prior to arrival. Patient landed lower to mid back on wet steps without significant head or neck injury. No loss consciousness or vomiting since. Pain with palpation and range of motion. No weakness, numbness or history of back surgery.  Patient is a 43 y.o. female presenting with fall and back pain. The history is provided by the patient.  Fall This is a new problem. Pertinent negatives include no chest pain, no abdominal pain, no headaches and no shortness of breath.  Back Pain Associated symptoms: no abdominal pain, no chest pain, no fever, no headaches, no numbness and no weakness     Past Medical History  Diagnosis Date  . GERD (gastroesophageal reflux disease)   . Hypertension   . Perforated ulcer     gastric  . Chronic abdominal pain   . Constipation   . IBS (irritable bowel syndrome)   . Fibromyalgia   . Cervical dysplasia    Past Surgical History  Procedure Laterality Date  . Leg surgery Left   . Tubal ligation    . Laparoscopic ovarian    . Hand surgery     Family History  Problem Relation Age of Onset  . Hypertension Mother   . Diabetes Mother   . Heart disease Mother   . Hypertension Father   . Diabetes Father   . Prostate cancer Father   . Heart disease Father   . Alcohol abuse Father   . Breast cancer Sister   . Hypertension Sister   . Diabetes Sister   . Heart disease Maternal Grandmother     Great GM  . Breast cancer Maternal Grandmother   . Bone cancer Maternal Grandmother   . Breast cancer Maternal Aunt   . Ovarian cancer Maternal  Aunt   . Colon cancer Maternal Aunt     great aunt  . Rheum arthritis Sister   . Colon cancer Son    History  Substance Use Topics  . Smoking status: Former Smoker    Types: Cigarettes    Quit date: 08/24/2012  . Smokeless tobacco: Never Used  . Alcohol Use: Yes     Comment: occasional   OB History   Grav Para Term Preterm Abortions TAB SAB Ect Mult Living   3 3 3  0 0 0 0 0 0 3     Review of Systems  Constitutional: Positive for appetite change. Negative for fever and chills.  Respiratory: Negative for shortness of breath.   Cardiovascular: Negative for chest pain.  Gastrointestinal: Negative for vomiting and abdominal pain.  Musculoskeletal: Positive for back pain. Negative for neck pain and neck stiffness.  Skin: Negative for rash.  Neurological: Negative for weakness, light-headedness, numbness and headaches.      Allergies  Review of patient's allergies indicates no known allergies.  Home Medications   Prior to Admission medications   Medication Sig Start Date End Date Taking? Authorizing Provider  amitriptyline (ELAVIL) 100 MG tablet Take 1 tablet (100 mg total) by mouth 2 (two) times daily. 12/25/12  Yes Shanker Kristeen Mans, MD  amLODipine (NORVASC) 10 MG tablet TAKE 1  TABLET BY MOUTH ONCE DAILY 06/06/13  Yes Clanford Marisa Hua, MD  cyclobenzaprine (FLEXERIL) 5 MG tablet TAKE 1 TABLET BY MOUTH 3 TIMES DAILY AS NEEDED FOR MUSCLE SPASMS. 09/04/13  Yes Angelica Chessman, MD  fluticasone (FLONASE) 50 MCG/ACT nasal spray INSTILL 2 SPRAYS INTO EACH NOSTRIL ONCE A DAY 05/03/13  Yes Clanford Marisa Hua, MD  ibuprofen (ADVIL,MOTRIN) 200 MG tablet Take 800 mg by mouth every 8 (eight) hours as needed for pain.   Yes Historical Provider, MD  loratadine (CLARITIN) 10 MG tablet Take 10 mg by mouth daily as needed for allergies.   Yes Historical Provider, MD  omeprazole (PRILOSEC) 40 MG capsule TAKE 1 CAPSULE BY MOUTH ONCE DAILY 09/04/13  Yes Angelica Chessman, MD  sodium chloride (OCEAN)  0.65 % nasal spray Place 1 spray into the nose as needed for congestion.   Yes Historical Provider, MD   BP 122/75  Pulse 97  Temp(Src) 97.7 F (36.5 C) (Oral)  Resp 18  Ht 5\' 9"  (1.753 m)  Wt 200 lb (90.719 kg)  BMI 29.52 kg/m2  SpO2 100%  LMP 09/22/2013 Physical Exam  Nursing note and vitals reviewed. Constitutional: She is oriented to person, place, and time. She appears well-developed and well-nourished.  HENT:  Head: Normocephalic and atraumatic.  Eyes: Conjunctivae are normal. Right eye exhibits no discharge. Left eye exhibits no discharge.  Neck: Normal range of motion. Neck supple. No tracheal deviation present.  Cardiovascular: Normal rate and regular rhythm.   Pulmonary/Chest: Effort normal and breath sounds normal.  Abdominal: Soft. She exhibits no distension. There is no tenderness. There is no guarding.  Musculoskeletal: She exhibits tenderness. She exhibits no edema.  Patient has tenderness across T12 to L2 region paraspinal worse with mild midline tenderness no step-off. Patient has superficial abrasion and mild swelling paraspinal horizontal/linear  Neurological: She is alert and oriented to person, place, and time.  Reflex Scores:      Patellar reflexes are 2+ on the right side and 2+ on the left side. Patient has 5+ and lower extremities with flexion and extension of hips knees and great toes. Sensation intact in major nerves in lower extremities  Skin: Skin is warm. No rash noted.  Psychiatric: She has a normal mood and affect.    ED Course  Procedures (including critical care time) Labs Review Labs Reviewed - No data to display  Imaging Review Dg Thoracic Spine W/swimmers  10/14/2013   CLINICAL DATA:  Fall, back pain.  EXAM: THORACIC SPINE - 2 VIEW + SWIMMERS  COMPARISON:  None.  FINDINGS: There is no evidence of thoracic spine fracture. Alignment is normal. Intervertebral disc heights preserved with mild ventral endplate spurring at the mid to lower  thoracic spine. No other significant bone abnormalities are identified.  IMPRESSION: Negative.   Electronically Signed   By: Elon Alas   On: 10/14/2013 02:22   Dg Lumbar Spine Complete  10/14/2013   CLINICAL DATA:  Fall, back pain.  EXAM: LUMBAR SPINE - COMPLETE 4+ VIEW  COMPARISON:  None.  FINDINGS: Five non rib-bearing lumbar-type vertebral bodies are intact and aligned with maintenance of the lumbar lordosis. Intervertebral disc heights are normal. No destructive bony lesions.  Sacroiliac joints are symmetric. Included prevertebral and paraspinal soft tissue planes are non-suspicious.  IMPRESSION: Negative.   Electronically Signed   By: Elon Alas   On: 10/14/2013 02:21     EKG Interpretation None      MDM   Final diagnoses:  Midline low back  pain without sciatica  Fall, initial encounter   Mechanical fall with tenderness lower thoracic and lumbar region. Plan for x-rays pain meds. Normal neurologic exam. Return instructions discussed.  Results and differential diagnosis were discussed with the patient/parent/guardian. Close follow up outpatient was discussed, comfortable with the plan.   Medications  HYDROcodone-acetaminophen (NORCO/VICODIN) 5-325 MG per tablet 2 tablet (2 tablets Oral Given 10/14/13 0129)  ibuprofen (ADVIL,MOTRIN) tablet 800 mg (800 mg Oral Given 10/14/13 0129)    Filed Vitals:   10/13/13 2155  BP: 122/75  Pulse: 97  Temp: 97.7 F (36.5 C)  TempSrc: Oral  Resp: 18  Height: 5\' 9"  (1.753 m)  Weight: 200 lb (90.719 kg)  SpO2: 100%         Mariea Clonts, MD 10/16/13 [phone]

## 2013-10-14 NOTE — Discharge Instructions (Signed)
If you were given medicines take as directed.  If you are on coumadin or contraceptives realize their levels and effectiveness is altered by many different medicines.  If you have any reaction (rash, tongues swelling, other) to the medicines stop taking and see a physician.   Please follow up as directed and return to the ER or see a physician for new or worsening symptoms.  Thank you. Filed Vitals:   10/13/13 2155  BP: 122/75  Pulse: 97  Temp: 97.7 F (36.5 C)  TempSrc: Oral  Resp: 18  Height: 5\' 9"  (1.753 m)  Weight: 200 lb (90.719 kg)  SpO2: 100%

## 2013-11-05 ENCOUNTER — Encounter: Payer: Self-pay | Admitting: Internal Medicine

## 2013-11-05 ENCOUNTER — Ambulatory Visit: Payer: No Typology Code available for payment source | Attending: Internal Medicine | Admitting: Internal Medicine

## 2013-11-05 VITALS — BP 145/87 | HR 106 | Temp 98.3°F | Resp 16 | Ht 69.5 in | Wt 196.0 lb

## 2013-11-05 DIAGNOSIS — Z0000 Encounter for general adult medical examination without abnormal findings: Secondary | ICD-10-CM

## 2013-11-05 DIAGNOSIS — I10 Essential (primary) hypertension: Secondary | ICD-10-CM | POA: Insufficient documentation

## 2013-11-05 DIAGNOSIS — Z87891 Personal history of nicotine dependence: Secondary | ICD-10-CM | POA: Insufficient documentation

## 2013-11-05 DIAGNOSIS — Z79899 Other long term (current) drug therapy: Secondary | ICD-10-CM | POA: Insufficient documentation

## 2013-11-05 DIAGNOSIS — M62838 Other muscle spasm: Secondary | ICD-10-CM

## 2013-11-05 DIAGNOSIS — K2100 Gastro-esophageal reflux disease with esophagitis, without bleeding: Secondary | ICD-10-CM

## 2013-11-05 DIAGNOSIS — K219 Gastro-esophageal reflux disease without esophagitis: Secondary | ICD-10-CM | POA: Insufficient documentation

## 2013-11-05 DIAGNOSIS — R229 Localized swelling, mass and lump, unspecified: Secondary | ICD-10-CM | POA: Insufficient documentation

## 2013-11-05 MED ORDER — AMITRIPTYLINE HCL 100 MG PO TABS: 100.0000 mg | ORAL_TABLET | Freq: Two times a day (BID) | ORAL | Status: DC

## 2013-11-05 MED ORDER — IBUPROFEN 200 MG PO TABS: 800.0000 mg | ORAL_TABLET | Freq: Three times a day (TID) | ORAL | Status: DC | PRN

## 2013-11-05 MED ORDER — FLUTICASONE PROPIONATE 50 MCG/ACT NA SUSP: NASAL | Status: DC

## 2013-11-05 MED ORDER — SALINE NASAL SPRAY 0.65 % NA SOLN: 1.0000 | NASAL | Status: DC | PRN

## 2013-11-05 MED ORDER — AMLODIPINE BESYLATE 10 MG PO TABS
ORAL_TABLET | ORAL | Status: DC
Start: 2013-11-05 — End: 2014-02-11

## 2013-11-05 MED ORDER — NAPROXEN 375 MG PO TABS: 375.0000 mg | ORAL_TABLET | Freq: Two times a day (BID) | ORAL | Status: DC

## 2013-11-05 MED ORDER — LORATADINE 10 MG PO TABS: 10.0000 mg | ORAL_TABLET | Freq: Every day | ORAL | Status: DC | PRN

## 2013-11-05 MED ORDER — CYCLOBENZAPRINE HCL 5 MG PO TABS: ORAL_TABLET | ORAL | Status: DC

## 2013-11-05 NOTE — Progress Notes (Signed)
Pt is here following up on her fibromyalgia. Pt states that she is having panic attacks. Pt is needing another referral to an orthopedic specialist.

## 2013-11-05 NOTE — Progress Notes (Signed)
Patient ID: Isabel Evans, female   DOB: [birth year], 43 y.o.   MRN: [id number]  CC: follow up  HPI: 43 year old female with past medical history of GERD, hypertension, fibromyalgia who presented to clinic for follow up and needing referral to orthopedic for evaluation of what seems as not on hands and arms. She were to have evaluation in recent past for this and it was removed. It seems like these are coming back now.  No Known Allergies Past Medical History  Diagnosis Date  . GERD (gastroesophageal reflux disease)   . Hypertension   . Perforated ulcer     gastric  . Chronic abdominal pain   . Constipation   . IBS (irritable bowel syndrome)   . Fibromyalgia   . Cervical dysplasia    Current Outpatient Prescriptions on File Prior to Visit  Medication Sig Dispense Refill  . omeprazole (PRILOSEC) 40 MG capsule TAKE 1 CAPSULE BY MOUTH ONCE DAILY  30 capsule  3  . HYDROcodone-acetaminophen (NORCO) 5-325 MG per tablet Take 2 tablets by mouth every 4 (four) hours as needed.  10 tablet  0   No current facility-administered medications on file prior to visit.   Family History  Problem Relation Age of Onset  . Hypertension Mother   . Diabetes Mother   . Heart disease Mother   . Hypertension Father   . Diabetes Father   . Prostate cancer Father   . Heart disease Father   . Alcohol abuse Father   . Breast cancer Sister   . Hypertension Sister   . Diabetes Sister   . Heart disease Maternal Grandmother     Great GM  . Breast cancer Maternal Grandmother   . Bone cancer Maternal Grandmother   . Breast cancer Maternal Aunt   . Ovarian cancer Maternal Aunt   . Colon cancer Maternal Aunt     great aunt  . Rheum arthritis Sister   . Colon cancer Son    History   Social History  . Marital Status: Single    Spouse Name: N/A    Number of Children: 3  . Years of Education: N/A   Occupational History  . DRIVER/CSR    Social History Main Topics  . Smoking status: Former Smoker   Types: Cigarettes    Quit date: 08/24/2012  . Smokeless tobacco: Never Used  . Alcohol Use: Yes     Comment: occasional  . Drug Use: No  . Sexual Activity: Yes    Birth Control/ Protection: Surgical   Other Topics Concern  . Not on file   Social History Narrative  . No narrative on file    Review of Systems  Constitutional: Negative for fever, chills, diaphoresis, activity change, appetite change and fatigue.  HENT: Negative for ear pain, nosebleeds, congestion, facial swelling, rhinorrhea, neck pain, neck stiffness and ear discharge.   Eyes: Negative for pain, discharge, redness, itching and visual disturbance.  Respiratory: Negative for cough, choking, chest tightness, shortness of breath, wheezing and stridor.   Cardiovascular: Negative for chest pain, palpitations and leg swelling.  Gastrointestinal: Negative for abdominal distention.  Genitourinary: Negative for dysuria, urgency, frequency, hematuria, flank pain, decreased urine volume, difficulty urinating and dyspareunia.  Musculoskeletal: Negative for back pain, joint swelling, arthralgias and gait problem.  Neurological: Negative for dizziness, tremors, seizures, syncope, facial asymmetry, speech difficulty, weakness, light-headedness, numbness and headaches.  Hematological: Negative for adenopathy. Does not bruise/bleed easily.  Psychiatric/Behavioral: Negative for hallucinations, behavioral problems, confusion, dysphoric mood, decreased concentration and agitation.  Objective:   Filed Vitals:   11/05/13 1404  BP: 145/87  Pulse: 106  Temp: 98.3 F (36.8 C)  Resp: 16    Physical Exam  Constitutional: Appears well-developed and well-nourished. No distress.  HENT: Normocephalic. External right and left ear normal. Oropharynx is clear and moist.  Eyes: Conjunctivae and EOM are normal. PERRLA, no scleral icterus.  Neck: Normal ROM. Neck supple. No JVD. No tracheal deviation. No thyromegaly.  CVS: RRR, S1/S2 +, no  murmurs, no gallops, no carotid bruit.  Pulmonary: Effort and breath sounds normal, no stridor, rhonchi, wheezes, rales.  Abdominal: Soft. BS +,  no distension, tenderness, rebound or guarding.  Musculoskeletal: Normal range of motion. No edema and no tenderness.  Lymphadenopathy: No lymphadenopathy noted, cervical, inguinal. Neuro: Alert. Normal reflexes, muscle tone coordination. No cranial nerve deficit. Skin: Skin is warm and dry. No rash noted. Not diaphoretic. No erythema. No pallor.  Psychiatric: Normal mood and affect. Behavior, judgment, thought content normal.   Lab Results  Component Value Date   WBC 6.1 10/11/2012   HGB 13.7 10/11/2012   HCT 38.6 10/11/2012   MCV 90.4 10/11/2012   PLT 256 10/11/2012   Lab Results  Component Value Date   CREATININE 0.68 10/11/2012   BUN 5* 10/11/2012   NA 136 10/11/2012   K 3.6 10/11/2012   CL 101 10/11/2012   CO2 26 10/11/2012    No results found for this basename: HGBA1C   Lipid Panel  No results found for this basename: chol, trig, hdl, cholhdl, vldl, ldlcalc       Assessment and plan:   Patient Active Problem List   Diagnosis Date Noted  . Multiple skin nodules 11/27/2012    Priority: Medium - much better, needs ortho referral again  . Hypertension 07/14/2012    Priority: Medium - We have discussed target BP range - I have advised pt to check BP regularly and to call us back if the numbers are higher than 140/90 - discussed the importance of compliance with medical therapy and diet  - continue Norvasc

## 2013-11-05 NOTE — Patient Instructions (Signed)
Hypertension Hypertension, commonly called high blood pressure, is when the force of blood pumping through your arteries is too strong. Your arteries are the blood vessels that carry blood from your heart throughout your body. A blood pressure reading consists of a higher number over a lower number, such as 110/72. The higher number (systolic) is the pressure inside your arteries when your heart pumps. The lower number (diastolic) is the pressure inside your arteries when your heart relaxes. Ideally you want your blood pressure below 120/80. Hypertension forces your heart to work harder to pump blood. Your arteries may become narrow or stiff. Having hypertension puts you at risk for heart disease, stroke, and other problems.  RISK FACTORS Some risk factors for high blood pressure are controllable. Others are not.  Risk factors you cannot control include:   Race. You may be at higher risk if you are African American.  Age. Risk increases with age.  Gender. Men are at higher risk than women before age 45 years. After age 65, women are at higher risk than men. Risk factors you can control include:  Not getting enough exercise or physical activity.  Being overweight.  Getting too much fat, sugar, calories, or salt in your diet.  Drinking too much alcohol. SIGNS AND SYMPTOMS Hypertension does not usually cause signs or symptoms. Extremely high blood pressure (hypertensive crisis) may cause headache, anxiety, shortness of breath, and nosebleed. DIAGNOSIS  To check if you have hypertension, your health care provider will measure your blood pressure while you are seated, with your arm held at the level of your heart. It should be measured at least twice using the same arm. Certain conditions can cause a difference in blood pressure between your right and left arms. A blood pressure reading that is higher than normal on one occasion does not mean that you need treatment. If one blood pressure reading  is high, ask your health care provider about having it checked again. TREATMENT  Treating high blood pressure includes making lifestyle changes and possibly taking medication. Living a healthy lifestyle can help lower high blood pressure. You may need to change some of your habits. Lifestyle changes may include:  Following the DASH diet. This diet is high in fruits, vegetables, and whole grains. It is low in salt, red meat, and added sugars.  Getting at least 2 1/2 hours of brisk physical activity every week.  Losing weight if necessary.  Not smoking.  Limiting alcoholic beverages.  Learning ways to reduce stress. If lifestyle changes are not enough to get your blood pressure under control, your health care provider may prescribe medicine. You may need to take more than one. Work closely with your health care provider to understand the risks and benefits. HOME CARE INSTRUCTIONS  Have your blood pressure rechecked as directed by your health care provider.   Only take medicine as directed by your health care provider. Follow the directions carefully. Blood pressure medicines must be taken as prescribed. The medicine does not work as well when you skip doses. Skipping doses also puts you at risk for problems.   Do not smoke.   Monitor your blood pressure at home as directed by your health care provider. SEEK MEDICAL CARE IF:   You think you are having a reaction to medicines taken.  You have recurrent headaches or feel dizzy.  You have swelling in your ankles.  You have trouble with your vision. SEEK IMMEDIATE MEDICAL CARE IF:  You develop a severe headache or   confusion.  You have unusual weakness, numbness, or feel faint.  You have severe chest or abdominal pain.  You vomit repeatedly.  You have trouble breathing. MAKE SURE YOU:   Understand these instructions.  Will watch your condition.  Will get help right away if you are not doing well or get  worse. Document Released: 04/05/2005 Document Revised: 04/10/2013 Document Reviewed: 01/26/2013 [employer] Patient Information 2015 [employer]. This information is not intended to replace advice given to you by your health care provider. Make sure you discuss any questions you have with your health care provider.  

## 2013-11-15 ENCOUNTER — Other Ambulatory Visit: Payer: Self-pay | Admitting: *Deleted

## 2013-11-15 ENCOUNTER — Encounter: Payer: Self-pay | Admitting: Sports Medicine

## 2013-11-15 ENCOUNTER — Ambulatory Visit (INDEPENDENT_AMBULATORY_CARE_PROVIDER_SITE_OTHER): Payer: Self-pay | Admitting: Sports Medicine

## 2013-11-15 VITALS — BP 121/85 | Ht 69.0 in | Wt 196.0 lb

## 2013-11-15 DIAGNOSIS — M25579 Pain in unspecified ankle and joints of unspecified foot: Secondary | ICD-10-CM

## 2013-11-15 DIAGNOSIS — M2011 Hallux valgus (acquired), right foot: Secondary | ICD-10-CM | POA: Insufficient documentation

## 2013-11-15 DIAGNOSIS — M720 Palmar fascial fibromatosis [Dupuytren]: Secondary | ICD-10-CM

## 2013-11-15 DIAGNOSIS — M2010 Hallux valgus (acquired), unspecified foot: Secondary | ICD-10-CM

## 2013-11-15 NOTE — Progress Notes (Signed)
  Isabel Evans - 43 y.o. female MRN [id number]  Date of birth: [birth year]  SUBJECTIVE:  Including CC & ROS.  Patient is a 43 yo female who presents today with c/o reoccurance of dupuytren's contracture. Patient had surgery in April by a orthopedic surgeon at [facility] . She reports since that since discharge from surgery over the past few weeks she has developed reoccurrences of the fixed forward curvature particular of the left 5th digit and has redeveloped several fibrous knots consistent with her pervious contracture particular in the palmer aspect of her 3rd, 4th and 5th digits. The knot are pain to palpation and her ROM is limited. 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 She often gets fatigue in her hands when she needs to use them a lot. She has not follow up with her hand surgeon since discharge in May of 2015.   Patient also c/o her right foot 1st MTP joint that has been sore, enlarging and curving inwards. The 1st MTP will be very painful particularly when wearing heavier shoes such as tennis shoes and particular after working on her feet. Patient has NSAIDS she take regular with no relief.    ROS: Review of systems otherwise negative except for information present in HPI  HISTORY: Past Medical, Surgical, Social, and Family History Reviewed & Updated per EMR. Pertinent Historical Findings include: History of dupuytren's contracture with surgical release unable to review surgery reports or imaging   PHYSICAL EXAM:  VS: BP:121/85 mmHg  HR: bpm  TEMP: ( )  RESP:   HT:5\' 9"  (175.3 cm)   WT:196 lb (88.905 kg)  BMI:29 PHYSICAL EXAM: HAND EXAM: General: well nourished Skin of UE: warm; dry, no rashes, lesions, ecchymosis or erythema. Vascular: radial pulses 2+ bilaterally Neurologically: Sensation to light touch upper extremities equal and intact bilaterally.        Observation: left hand Digit 3, 4, 5 have  fibrous knots within the palmer aspect that are tender, 5th digit has started to contract despite surgical intervention  no swelling, no erythema, no bruising  Palpation: no tenderness over each carpal bone or metatarsal, radial styloid pain, no pain over the tendons sheath of each tunnel    ROM: normal ROM in supination and pronation, elbow extension and flexion    Strength: Normal 5/5 strength with extension/ flexion, pronation and supination.       Special test: stable medial and lateral collateral ligaments of each finger  Foot/Ankle Exam:  Skin of LE: warm; dry, no rashes, lesions, ecchymosis or erythema. Vascular: radial pulses 2+ bilaterally Neurologically: Sensation to light touch lower extremities equal and intact bilaterally.  Observation - no ecchymosis, no edema, or hematoma present  No tenderness to palpation over each metatarsal, cuboid, navicular, cuneiforms, or Lisfranc joint No tenderness of the 1st MTP joint with mild to moderate hallux valgus  ROM: Normal range of motion with flexion and extension of the phalanx/ metatarsals Muscle strength: Normal strength in dorsiflexion, plantar flexion, inversion, eversion and phalanx extension/flexion 5/5 strength bilaterally.   Weight bearing revealed passive high medial arche that collapse with WB. Gait show pronation with particular stress on the 1st MTP  ASSESSMENT & PLAN: See problem based charting & AVS for pt instructions.

## 2013-11-15 NOTE — Assessment & Plan Note (Signed)
Patient is developing mild to moderate hallux valgus in bilateral MTP joints with pain particular on the right. Give that patient has arche collapse and pronation with her gait she likely is putting extra pressure on the 1st ray. Patient was fitted for insole and have bilateral 1st ray post place to provide extra support to the MTP and provide additional arch support. Advise patient to try these insoles for 2-3 week if she need a adjustment or no clinical improvement to follow up at that time.

## 2013-11-15 NOTE — Assessment & Plan Note (Signed)
Patient having reoccurrence of Dupuytren's contracture in the hand. I can not offer any treatment option and recommend patient returns to her hand surgeon for re-evaluation and possible other interventions.

## 2013-11-22 ENCOUNTER — Encounter: Payer: Self-pay | Admitting: Family Medicine

## 2013-11-22 ENCOUNTER — Ambulatory Visit: Payer: No Typology Code available for payment source

## 2013-11-22 ENCOUNTER — Ambulatory Visit (INDEPENDENT_AMBULATORY_CARE_PROVIDER_SITE_OTHER): Payer: No Typology Code available for payment source | Admitting: Family Medicine

## 2013-11-22 ENCOUNTER — Other Ambulatory Visit (HOSPITAL_COMMUNITY)
Admission: RE | Admit: 2013-11-22 | Discharge: 2013-11-22 | Disposition: A | Discharge status: Home / Self Care | Payer: No Typology Code available for payment source | Source: Ambulatory Visit | Attending: Family Medicine | Admitting: Family Medicine

## 2013-11-22 VITALS — BP 115/80 | HR 97 | Temp 98.0°F | Wt 196.0 lb

## 2013-11-22 DIAGNOSIS — R6889 Other general symptoms and signs: Secondary | ICD-10-CM

## 2013-11-22 DIAGNOSIS — IMO0002 Reserved for concepts with insufficient information to code with codable children: Secondary | ICD-10-CM

## 2013-11-22 DIAGNOSIS — Z1151 Encounter for screening for human papillomavirus (HPV): Secondary | ICD-10-CM | POA: Insufficient documentation

## 2013-11-22 DIAGNOSIS — Z124 Encounter for screening for malignant neoplasm of cervix: Secondary | ICD-10-CM

## 2013-11-22 NOTE — Patient Instructions (Signed)
We will call you with the results of today's PAP.    Pap Test A Pap test checks the cells on the surface of your cervix. Your doctor will look for cell changes that are not normal, an infection, or cancer. If the cells no longer look normal, it is called dysplasia. Dysplasia can turn into cancer. Regular Pap tests are important to stop cancer from developing. BEFORE THE PROCEDURE  Ask your doctor when to schedule your Pap test. Timing the test around your period may be important.  Do not douche or have sex (intercourse) for 24 hours before the test.  Do not put creams on your vagina or use tampons for 24 hours before the test.  Go pee (urinate) just before the test. PROCEDURE  You will lie on an exam table with your feet in stirrups.  A warm metal or plastic tool (speculum) will be put in your vagina to open it up.  Your doctor will use a small, plastic brush or wooden spatula to take cells from your cervix.  The cells will be put in a lab container.  The cells will be checked under a microscope to see if they are normal or not. AFTER THE PROCEDURE Get your test results. If they are abnormal, you may need more tests. Document Released: 05/08/2010 Document Revised: 06/28/2011 Document Reviewed: 04/01/2011 [facility] Patient Information 2015 [place], [place]. This information is not intended to replace advice given to you by your health care provider. Make sure you discuss any questions you have with your health care provider.

## 2013-11-22 NOTE — Progress Notes (Signed)
   Subjective:    Patient ID: Isabel Evans, female    DOB: 04-Mar-1971, 43 y.o.   MRN: [id number]  HPI Isabel Evans is here for female health exam.  Patient has a history of bilateral tubal ligation. She is in a relationship and doesn't use any protection for intercourse. She recently had a mammogram that was normal.   She has a history of abnormal Pap smear last year. 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 Ihor Dow. She denies any dysfunctional uterine bleeding at this time. She denies any pelvic pain or tenderness.   Current Outpatient Prescriptions on File Prior to Visit  Medication Sig Dispense Refill  . amitriptyline (ELAVIL) 100 MG tablet Take 1 tablet (100 mg total) by mouth 2 (two) times daily.  60 tablet  3  . amLODipine (NORVASC) 10 MG tablet TAKE 1 TABLET BY MOUTH ONCE DAILY  30 tablet  3  . cyclobenzaprine (FLEXERIL) 5 MG tablet TAKE 1 TABLET BY MOUTH 3 TIMES DAILY AS NEEDED FOR MUSCLE SPASMS.  90 tablet  1  . fluticasone (FLONASE) 50 MCG/ACT nasal spray INSTILL 2 SPRAYS INTO EACH NOSTRIL ONCE A DAY  16 g  3  . HYDROcodone-acetaminophen (NORCO) 5-325 MG per tablet Take 2 tablets by mouth every 4 (four) hours as needed.  10 tablet  0  . ibuprofen (ADVIL,MOTRIN) 200 MG tablet Take 4 tablets (800 mg total) by mouth every 8 (eight) hours as needed.  30 tablet  3  . loratadine (CLARITIN) 10 MG tablet Take 1 tablet (10 mg total) by mouth daily as needed for allergies.  30 tablet  3  . naproxen (NAPROSYN) 375 MG tablet Take 1 tablet (375 mg total) by mouth 2 (two) times daily.  60 tablet  3  . omeprazole (PRILOSEC) 40 MG capsule TAKE 1 CAPSULE BY MOUTH ONCE DAILY  30 capsule  3  . ranitidine (ZANTAC) 75 MG tablet Take 75 mg by mouth.      . sodium chloride (OCEAN) 0.65 % nasal spray Place 1 spray into the nose as needed for congestion.  30 mL  3   No current facility-administered medications on file prior to visit.    Review of  Systems See HPI     Objective:   Physical Exam BP 115/80  Pulse 97  Temp(Src) 98 F (36.7 C) (Oral)  Wt 196 lb (88.905 kg)  LMP 10/18/2013 Gen: NAD, alert, cooperative with exam, well-appearing GU: > External: no lesions > Vagina: no blood in vault > Cervix: no lesion; no mucopurulent d/c;         Assessment & Plan:

## 2013-11-23 DIAGNOSIS — IMO0002 Reserved for concepts with insufficient information to code with codable children: Secondary | ICD-10-CM | POA: Insufficient documentation

## 2013-11-23 LAB — CYTOLOGY - PAP

## 2013-11-23 NOTE — Assessment & Plan Note (Signed)
Pap smear performed today after recommendation based on her colposcopy last year. Currently asymptomatic.  - will call with results.

## 2013-11-26 ENCOUNTER — Telehealth: Payer: Self-pay | Admitting: *Deleted

## 2013-11-26 NOTE — Telephone Encounter (Signed)
Message copied by Corinna Capra on Mon Nov 26, 2013  9:42 AM ------      Message from: Rosemarie Ax      Created: Sun Nov 25, 2013  4:44 PM       Please call patient to let her know that PAP was normal. Next pap in three years. Thank you. ------

## 2013-11-26 NOTE — Telephone Encounter (Signed)
Relayed message,patient voiced understanding. Isabel Evans S  

## 2013-12-05 ENCOUNTER — Emergency Department (HOSPITAL_COMMUNITY)
Admission: EM | Admit: 2013-12-05 | Discharge: 2013-12-05 | Disposition: A | Discharge status: Home / Self Care | Payer: No Typology Code available for payment source | Attending: Emergency Medicine | Admitting: Emergency Medicine

## 2013-12-05 ENCOUNTER — Encounter (HOSPITAL_COMMUNITY): Payer: Self-pay | Admitting: Emergency Medicine

## 2013-12-05 DIAGNOSIS — IMO0001 Reserved for inherently not codable concepts without codable children: Secondary | ICD-10-CM | POA: Insufficient documentation

## 2013-12-05 DIAGNOSIS — I10 Essential (primary) hypertension: Secondary | ICD-10-CM | POA: Insufficient documentation

## 2013-12-05 DIAGNOSIS — Z8742 Personal history of other diseases of the female genital tract: Secondary | ICD-10-CM | POA: Insufficient documentation

## 2013-12-05 DIAGNOSIS — X503XXA Overexertion from repetitive movements, initial encounter: Secondary | ICD-10-CM | POA: Insufficient documentation

## 2013-12-05 DIAGNOSIS — K219 Gastro-esophageal reflux disease without esophagitis: Secondary | ICD-10-CM | POA: Insufficient documentation

## 2013-12-05 DIAGNOSIS — Y9389 Activity, other specified: Secondary | ICD-10-CM | POA: Insufficient documentation

## 2013-12-05 DIAGNOSIS — IMO0002 Reserved for concepts with insufficient information to code with codable children: Secondary | ICD-10-CM | POA: Insufficient documentation

## 2013-12-05 DIAGNOSIS — Y929 Unspecified place or not applicable: Secondary | ICD-10-CM | POA: Insufficient documentation

## 2013-12-05 DIAGNOSIS — Z87891 Personal history of nicotine dependence: Secondary | ICD-10-CM | POA: Insufficient documentation

## 2013-12-05 DIAGNOSIS — Z3202 Encounter for pregnancy test, result negative: Secondary | ICD-10-CM | POA: Insufficient documentation

## 2013-12-05 DIAGNOSIS — G8929 Other chronic pain: Secondary | ICD-10-CM | POA: Insufficient documentation

## 2013-12-05 DIAGNOSIS — Z79899 Other long term (current) drug therapy: Secondary | ICD-10-CM | POA: Insufficient documentation

## 2013-12-05 DIAGNOSIS — S39012A Strain of muscle, fascia and tendon of lower back, initial encounter: Secondary | ICD-10-CM

## 2013-12-05 DIAGNOSIS — S335XXA Sprain of ligaments of lumbar spine, initial encounter: Secondary | ICD-10-CM | POA: Insufficient documentation

## 2013-12-05 LAB — URINALYSIS, ROUTINE W REFLEX MICROSCOPIC
Bilirubin Urine: NEGATIVE
GLUCOSE, UA: NEGATIVE mg/dL
Hgb urine dipstick: NEGATIVE
KETONES UR: NEGATIVE mg/dL
Leukocytes, UA: NEGATIVE
Nitrite: NEGATIVE
PROTEIN: NEGATIVE mg/dL
Specific Gravity, Urine: 1.011 (ref 1.005–1.030)
Urobilinogen, UA: 0.2 mg/dL (ref 0.0–1.0)
pH: 7 (ref 5.0–8.0)

## 2013-12-05 LAB — POC URINE PREG, ED: Preg Test, Ur: NEGATIVE

## 2013-12-05 MED ORDER — HYDROCODONE-ACETAMINOPHEN 5-325 MG PO TABS
1.0000 | ORAL_TABLET | ORAL | Status: DC | PRN
Start: 2013-12-05 — End: 2014-05-23

## 2013-12-05 MED ORDER — ONDANSETRON 8 MG PO TBDP
8.0000 mg | ORAL_TABLET | Freq: Once | ORAL | Status: AC
  Administered 2013-12-05: 8 mg via ORAL
  Filled 2013-12-05: qty 1

## 2013-12-05 MED ORDER — CYCLOBENZAPRINE HCL 10 MG PO TABS: 10.0000 mg | ORAL_TABLET | Freq: Two times a day (BID) | ORAL | Status: DC | PRN

## 2013-12-05 NOTE — ED Notes (Signed)
Pt reporting nausea at this time 

## 2013-12-05 NOTE — ED Notes (Signed)
Pt reports lower back pain x2 days that became worse last night. Pt denies recent fall or injury but did have a fall 1 month ago. Pt also has hx of uti's and reports increase frequency but no burning or other problems. Pt ambulatory.

## 2013-12-05 NOTE — ED Provider Notes (Signed)
CSN: [id number]     Arrival date & time 12/05/13  1119 History  This chart was scribed for non-physician practitioner, Jeannett Senior, PA-C working with Carmin Muskrat, MD by Frederich Balding, ED scribe. This patient was seen in room WTR8/WTR8 and the patient's care was started at 12:03 PM.   Chief Complaint  Patient presents with  . Back Pain   The history is provided by the patient. No language interpreter was used.   HPI Comments: Isabel Evans is a 43 y.o. female who presents to the Emergency Department complaining of gradual onset lower back pain, right side worse than left, that started 2 days ago. States pain worsened yesterday after pushing around furniture. Reports urinary frequency and nausea. Denies recent fall or injury but reports a fall one month ago after slipping in the rain. Movement worsens the pain. She takes 800 mg ibuprofen daily for pain and states it provided little relief. She states [facility] is trying to get her into pain management for chronic neck pain but states the clinic called her and said she couldn't get in until next September. Pt is currently trying to find a different pain clinic. Denies fever, abdominal pain, emesis, dysuria, numbness or weakness in legs. Pt has history of UTIs.   Past Medical History  Diagnosis Date  . GERD (gastroesophageal reflux disease)   . Hypertension   . Perforated ulcer     gastric  . Chronic abdominal pain   . Constipation   . IBS (irritable bowel syndrome)   . Fibromyalgia   . Cervical dysplasia    Past Surgical History  Procedure Laterality Date  . Leg surgery Left   . Tubal ligation    . Laparoscopic ovarian    . Hand surgery     Family History  Problem Relation Age of Onset  . Hypertension Mother   . Diabetes Mother   . Heart disease Mother   . Hypertension Father   . Diabetes Father   . Prostate cancer Father   . Heart disease Father   . Alcohol abuse Father   . Breast cancer Sister   .  Hypertension Sister   . Diabetes Sister   . Heart disease Maternal Grandmother     Great GM  . Breast cancer Maternal Grandmother   . Bone cancer Maternal Grandmother   . Breast cancer Maternal Aunt   . Ovarian cancer Maternal Aunt   . Colon cancer Maternal Aunt     great aunt  . Rheum arthritis Sister   . Colon cancer Son    History  Substance Use Topics  . Smoking status: Former Smoker    Types: Cigarettes    Quit date: 08/24/2012  . Smokeless tobacco: Never Used  . Alcohol Use: Yes     Comment: occasional   OB History   Grav Para Term Preterm Abortions TAB SAB Ect Mult Living   3 3 3  0 0 0 0 0 0 3     Review of Systems  Constitutional: Negative for fever.  Gastrointestinal: Positive for nausea. Negative for vomiting and abdominal pain.  Genitourinary: Positive for frequency. Negative for dysuria.  Musculoskeletal: Positive for back pain.  Neurological: Negative for weakness and numbness.  All other systems reviewed and are negative.  Allergies  Review of patient's allergies indicates no known allergies.  Home Medications   Prior to Admission medications   Medication Sig Start Date End Date Taking? Authorizing Provider  amitriptyline (ELAVIL) 100 MG tablet Take 1  tablet (100 mg total) by mouth 2 (two) times daily. 11/05/13   Robbie Lis, MD  amLODipine (NORVASC) 10 MG tablet TAKE 1 TABLET BY MOUTH ONCE DAILY 11/05/13   Robbie Lis, MD  cyclobenzaprine (FLEXERIL) 5 MG tablet TAKE 1 TABLET BY MOUTH 3 TIMES DAILY AS NEEDED FOR MUSCLE SPASMS. 11/05/13   Robbie Lis, MD  fluticasone (FLONASE) 50 MCG/ACT nasal spray INSTILL 2 SPRAYS INTO EACH NOSTRIL ONCE A DAY 11/05/13   Robbie Lis, MD  HYDROcodone-acetaminophen [facility]) 5-325 MG per tablet Take 2 tablets by mouth every 4 (four) hours as needed. 10/14/13   Mariea Clonts, MD  ibuprofen (ADVIL,MOTRIN) 200 MG tablet Take 4 tablets (800 mg total) by mouth every 8 (eight) hours as needed. 11/05/13   Robbie Lis, MD   loratadine (CLARITIN) 10 MG tablet Take 1 tablet (10 mg total) by mouth daily as needed for allergies. 11/05/13   Robbie Lis, MD  naproxen (NAPROSYN) 375 MG tablet Take 1 tablet (375 mg total) by mouth 2 (two) times daily. 11/05/13   Robbie Lis, MD  omeprazole (PRILOSEC) 40 MG capsule TAKE 1 CAPSULE BY MOUTH ONCE DAILY 09/04/13   Angelica Chessman, MD  ranitidine (ZANTAC) 75 MG tablet Take 75 mg by mouth.    Historical Provider, MD  sodium chloride (OCEAN) 0.65 % nasal spray Place 1 spray into the nose as needed for congestion. 11/05/13   Robbie Lis, MD   BP 129/82  Pulse 97  Temp(Src) 98.5 F (36.9 C) (Oral)  Resp 16  SpO2 100%  LMP 11/17/2013  Physical Exam  Nursing note and vitals reviewed. Constitutional: She is oriented to person, place, and time. She appears well-developed and well-nourished. No distress.  HENT:  Head: Normocephalic and atraumatic.  Eyes: Conjunctivae and EOM are normal.  Neck: Neck supple. No tracheal deviation present.  Cardiovascular: Normal rate.   Pulmonary/Chest: Effort normal. No respiratory distress.  Abdominal: There is no tenderness.  Right CVA tenderness  Musculoskeletal: Normal range of motion.  No midline lumbar spine tenderness. Right perispinal tenderness. No pain with straight leg raise.   Neurological: She is alert and oriented to person, place, and time.  5/5 and equal lower extremity strength. 2+ and equal patellar reflexes bilaterally. Pt able to dorsiflex bilateral toes and feet with good strength against resistance. Equal sensation bilaterally over thighs and lower legs.   Skin: Skin is warm and dry.  Psychiatric: She has a normal mood and affect. Her behavior is normal.    ED Course  Procedures (including critical care time)  DIAGNOSTIC STUDIES: Oxygen Saturation is 100% on RA, normal by my interpretation.    COORDINATION OF CARE: 12:05 PM-Discussed treatment plan which includes UA with pt at bedside and pt agreed to plan.    Labs Review Labs Reviewed  URINALYSIS, ROUTINE W REFLEX MICROSCOPIC  POC URINE PREG, ED    Imaging Review No results found.   EKG Interpretation None      MDM   Final diagnoses:  Strain of lumbar paraspinal muscle, initial encounter    Patient with a right lower back pain after moving some furniture 2 days ago. History of chronic neck and back pain, waiting to get into the pain management at this time. Patient is also concerned about possible urinary tract infection, states she has been urinating frequently. Urinalysis obtained and is normal. She has no neuro deficits, no concern for cauda equina. She is ambulatory. No imaging indicated at this time  given mechanism of injury and PE finding. Home with naprosyn, norco, flexeril. Follow up with pcp.   Filed Vitals:   12/05/13 1136  BP: 129/82  Pulse: 97  Temp: 98.5 F (36.9 C)  TempSrc: Oral  Resp: 16  SpO2: 100%     I personally performed the services described in this documentation, which was scribed in my presence. The recorded information has been reviewed and is accurate.  Renold Genta, PA-C 12/05/13 1251

## 2013-12-05 NOTE — Discharge Instructions (Signed)
Continue naprosyn for pain and inflammation. norco for severe pain only. Try heating pads. Stretches. Flexeril for spasms. Please follow up with primary care doctor for further treatment if not improving.    Back Pain, Adult Low back pain is very common. About 1 in 5 people have back pain.The cause of low back pain is rarely dangerous. The pain often gets better over time.About half of people with a sudden onset of back pain feel better in just 2 weeks. About 8 in 10 people feel better by 6 weeks.  CAUSES Some common causes of back pain include:  Strain of the muscles or ligaments supporting the spine.  Wear and tear (degeneration) of the spinal discs.  Arthritis.  Direct injury to the back. DIAGNOSIS Most of the time, the direct cause of low back pain is not known.However, back pain can be treated effectively even when the exact cause of the pain is unknown.Answering your caregiver's questions about your overall health and symptoms is one of the most accurate ways to make sure the cause of your pain is not dangerous. If your caregiver needs more information, he or she may order lab work or imaging tests (X-rays or MRIs).However, even if imaging tests show changes in your back, this usually does not require surgery. HOME CARE INSTRUCTIONS For many people, back pain returns.Since low back pain is rarely dangerous, it is often a condition that people can learn to [facility] their own.   Remain active. It is stressful on the back to sit or stand in one place. Do not sit, drive, or stand in one place for more than 30 minutes at a time. Take short walks on level surfaces as soon as pain allows.Try to increase the length of time you walk each day.  Do not stay in bed.Resting more than 1 or 2 days can delay your recovery.  Do not avoid exercise or work.Your body is made to move.It is not dangerous to be active, even though your back may hurt.Your back will likely heal faster if you return  to being active before your pain is gone.  Pay attention to your body when you bend and lift. Many people have less discomfortwhen lifting if they bend their knees, keep the load close to their bodies,and avoid twisting. Often, the most comfortable positions are those that put less stress on your recovering back.  Find a comfortable position to sleep. Use a firm mattress and lie on your side with your knees slightly bent. If you lie on your back, put a pillow under your knees.  Only take over-the-counter or prescription medicines as directed by your caregiver. Over-the-counter medicines to reduce pain and inflammation are often the most helpful.Your caregiver may prescribe muscle relaxant drugs.These medicines help dull your pain so you can more quickly return to your normal activities and healthy exercise.  Put ice on the injured area.  Put ice in a plastic bag.  Place a towel between your skin and the bag.  Leave the ice on for 15-20 minutes, 03-04 times a day for the first 2 to 3 days. After that, ice and heat may be alternated to reduce pain and spasms.  Ask your caregiver about trying back exercises and gentle massage. This may be of some benefit.  Avoid feeling anxious or stressed.Stress increases muscle tension and can worsen back pain.It is important to recognize when you are anxious or stressed and learn ways to manage it.Exercise is a great option. SEEK MEDICAL CARE IF:  You  have pain that is not relieved with rest or medicine.  You have pain that does not improve in 1 week.  You have new symptoms.  You are generally not feeling well. SEEK IMMEDIATE MEDICAL CARE IF:   You have pain that radiates from your back into your legs.  You develop new bowel or bladder control problems.  You have unusual weakness or numbness in your arms or legs.  You develop nausea or vomiting.  You develop abdominal pain.  You feel faint. Document Released: 04/05/2005 Document  Revised: 10/05/2011 Document Reviewed: 08/07/2013 [facility] Patient Information 2015 [place], [place]. This information is not intended to replace advice given to you by your health care provider. Make sure you discuss any questions you have with your health care provider.

## 2013-12-06 NOTE — ED Provider Notes (Signed)
  Medical screening examination/treatment/procedure(s) were performed by non-physician practitioner and as supervising physician I was immediately available for consultation/collaboration.   EKG Interpretation None         Carmin Muskrat, MD 12/06/13 [phone]

## 2014-01-03 ENCOUNTER — Other Ambulatory Visit: Payer: Self-pay | Admitting: Internal Medicine

## 2014-01-04 ENCOUNTER — Other Ambulatory Visit: Payer: Self-pay | Admitting: Internal Medicine

## 2014-01-11 ENCOUNTER — Other Ambulatory Visit: Payer: Self-pay

## 2014-01-11 DIAGNOSIS — M62838 Other muscle spasm: Secondary | ICD-10-CM

## 2014-01-11 MED ORDER — CYCLOBENZAPRINE HCL 5 MG PO TABS: ORAL_TABLET | ORAL | Status: DC

## 2014-02-11 ENCOUNTER — Ambulatory Visit: Payer: No Typology Code available for payment source | Attending: Internal Medicine | Admitting: Internal Medicine

## 2014-02-11 ENCOUNTER — Encounter: Payer: Self-pay | Admitting: Internal Medicine

## 2014-02-11 VITALS — BP 131/85 | HR 106 | Temp 98.3°F | Resp 16 | Ht 71.0 in | Wt 200.0 lb

## 2014-02-11 DIAGNOSIS — Z1239 Encounter for other screening for malignant neoplasm of breast: Secondary | ICD-10-CM

## 2014-02-11 DIAGNOSIS — M797 Fibromyalgia: Secondary | ICD-10-CM | POA: Insufficient documentation

## 2014-02-11 DIAGNOSIS — K219 Gastro-esophageal reflux disease without esophagitis: Secondary | ICD-10-CM | POA: Insufficient documentation

## 2014-02-11 DIAGNOSIS — Z791 Long term (current) use of non-steroidal anti-inflammatories (NSAID): Secondary | ICD-10-CM | POA: Insufficient documentation

## 2014-02-11 DIAGNOSIS — Z79899 Other long term (current) drug therapy: Secondary | ICD-10-CM | POA: Insufficient documentation

## 2014-02-11 DIAGNOSIS — Z23 Encounter for immunization: Secondary | ICD-10-CM

## 2014-02-11 DIAGNOSIS — F410 Panic disorder [episodic paroxysmal anxiety] without agoraphobia: Secondary | ICD-10-CM | POA: Insufficient documentation

## 2014-02-11 DIAGNOSIS — T7840XA Allergy, unspecified, initial encounter: Secondary | ICD-10-CM

## 2014-02-11 DIAGNOSIS — Z87891 Personal history of nicotine dependence: Secondary | ICD-10-CM | POA: Insufficient documentation

## 2014-02-11 DIAGNOSIS — I10 Essential (primary) hypertension: Secondary | ICD-10-CM | POA: Insufficient documentation

## 2014-02-11 DIAGNOSIS — Z7951 Long term (current) use of inhaled steroids: Secondary | ICD-10-CM | POA: Insufficient documentation

## 2014-02-11 MED ORDER — CYCLOBENZAPRINE HCL 10 MG PO TABS: 10.0000 mg | ORAL_TABLET | Freq: Two times a day (BID) | ORAL | Status: DC | PRN

## 2014-02-11 MED ORDER — GABAPENTIN 100 MG PO CAPS: 100.0000 mg | ORAL_CAPSULE | Freq: Three times a day (TID) | ORAL | Status: DC

## 2014-02-11 MED ORDER — AMLODIPINE BESYLATE 10 MG PO TABS: ORAL_TABLET | ORAL | Status: DC

## 2014-02-11 MED ORDER — LORATADINE 10 MG PO TABS: 10.0000 mg | ORAL_TABLET | Freq: Every day | ORAL | Status: DC | PRN

## 2014-02-11 MED ORDER — DULOXETINE HCL 20 MG PO CPEP: 20.0000 mg | ORAL_CAPSULE | Freq: Every day | ORAL | Status: DC

## 2014-02-11 NOTE — Patient Instructions (Signed)
Fibromyalgia Fibromyalgia is a disorder that is often misunderstood. It is associated with muscular pains and tenderness that comes and goes. It is often associated with fatigue and sleep disturbances. Though it tends to be long-lasting, fibromyalgia is not life-threatening. CAUSES  The exact cause of fibromyalgia is unknown. People with certain gene types are predisposed to developing fibromyalgia and other conditions. Certain factors can play a role as triggers, such as:  Spine disorders.  Arthritis.  Severe injury (trauma) and other physical stressors.  Emotional stressors. SYMPTOMS   The main symptom is pain and stiffness in the muscles and joints, which can vary over time.  Sleep and fatigue problems. Other related symptoms may include:  Bowel and bladder problems.  Headaches.  Visual problems.  Problems with odors and noises.  Depression or mood changes.  Painful periods (dysmenorrhea).  Dryness of the skin or eyes. DIAGNOSIS  There are no specific tests for diagnosing fibromyalgia. Patients can be diagnosed accurately from the specific symptoms they have. The diagnosis is made by determining that nothing else is causing the problems. TREATMENT  There is no cure. Management includes medicines and an active, healthy lifestyle. The goal is to enhance physical fitness, decrease pain, and improve sleep. HOME CARE INSTRUCTIONS   Only take over-the-counter or prescription medicines as directed by your caregiver. Sleeping pills, tranquilizers, and pain medicines may make your problems worse.  Low-impact aerobic exercise is very important and advised for treatment. At first, it may seem to make pain worse. Gradually increasing your tolerance will overcome this feeling.  Learning relaxation techniques and how to control stress will help you. Biofeedback, visual imagery, hypnosis, muscle relaxation, yoga, and meditation are all options.  Anti-inflammatory medicines and  physical therapy may provide short-term help.  Acupuncture or massage treatments may help.  Take muscle relaxant medicines as suggested by your caregiver.  Avoid stressful situations.  Plan a healthy lifestyle. This includes your diet, sleep, rest, exercise, and friends.  Find and practice a hobby you enjoy.  Join a fibromyalgia support group for interaction, ideas, and sharing advice. This may be helpful. SEEK MEDICAL CARE IF:  You are not having good results or improvement from your treatment. FOR MORE INFORMATION  National Fibromyalgia Association: www.fmaware.org Arthritis Foundation: www.arthritis.org Document Released: 04/05/2005 Document Revised: 06/28/2011 Document Reviewed: 07/16/2009 [employer] Patient Information 2015 [employer]. This information is not intended to replace advice given to you by your health care provider. Make sure you discuss any questions you have with your health care provider.  

## 2014-02-11 NOTE — Progress Notes (Signed)
Patient ID: Isabel Evans, female   DOB: [birth year], 43 y.o.   MRN: [id number]   Isabel Evans, is a 43 y.o. female  SAY:[id number]  ATF:[id number]  DOB - [birth year]  Chief Complaint  Patient presents with  . Follow-up        Subjective:   Isabel Evans is a 43 y.o. female here today for a follow up visit. PMH significant for HTN, fibromyalgia, and LGSIL.  She is a former smoker (quit 02/16/2013) and she occasionally drinks alcohol.  She presents today with complaints of worsening fibromyalgia pain, especially in the back of her head and bilateral shoulders.  Her current medication regimen has not been effective at managing her symptoms for several months now.  She also has been having panic attacks.  She reports the attacks have increased from 1-2 a week to 2-3 a day.  She experiences palpitations, feels as though her "heart is racing" and is SOB during her attacks.  She endorses a significant amount of stress currently; she has had several family members die recently and her father was recently diagnosed with prostate CA with mets to the bones.  She is up to date on her pap smear.  She has a significant family hx of breast cancer (grandmother and aunt who were diagnosed in 70's) and is requesting a mammogram.  She is also requesting an influenza vaccination.  No problems updated.  ALLERGIES: No Known Allergies  PAST MEDICAL HISTORY: Past Medical History  Diagnosis Date  . GERD (gastroesophageal reflux disease)   . Hypertension   . Perforated ulcer     gastric  . Chronic abdominal pain   . Constipation   . IBS (irritable bowel syndrome)   . Fibromyalgia   . Cervical dysplasia     MEDICATIONS AT HOME: Prior to Admission medications   Medication Sig Start Date End Date Taking? Authorizing Provider  amitriptyline (ELAVIL) 100 MG tablet Take 1 tablet (100 mg total) by mouth 2 (two) times daily. 11/05/13  Yes Robbie Lis, MD  amLODipine (NORVASC) 10 MG tablet TAKE 1 TABLET BY  MOUTH ONCE DAILY 11/05/13  Yes Robbie Lis, MD  cyclobenzaprine (FLEXERIL) 10 MG tablet Take 1 tablet (10 mg total) by mouth 2 (two) times daily as needed for muscle spasms. 12/05/13  Yes Tatyana A Kirichenko, PA-C  cyclobenzaprine (FLEXERIL) 5 MG tablet TAKE 1 TABLET BY MOUTH 3 TIMES DAILY AS NEEDED FOR MUSCLE SPASMS. 01/11/14  Yes Deepak Advani, MD  fluticasone (FLONASE) 50 MCG/ACT nasal spray INSTILL 2 SPRAYS INTO EACH NOSTRIL ONCE A DAY 11/05/13  Yes Robbie Lis, MD  ibuprofen (ADVIL,MOTRIN) 200 MG tablet Take 4 tablets (800 mg total) by mouth every 8 (eight) hours as needed. 11/05/13  Yes Robbie Lis, MD  loratadine (CLARITIN) 10 MG tablet Take 1 tablet (10 mg total) by mouth daily as needed for allergies. 11/05/13  Yes Robbie Lis, MD  omeprazole (PRILOSEC) 40 MG capsule TAKE 1 CAPSULE BY MOUTH ONCE DAILY. 01/03/14  Yes Tresa Garter, MD  ranitidine (ZANTAC) 75 MG tablet Take 75 mg by mouth.   Yes Historical Provider, MD  sodium chloride (OCEAN) 0.65 % nasal spray Place 1 spray into the nose as needed for congestion. 11/05/13  Yes Robbie Lis, MD  HYDROcodone-acetaminophen (NORCO) 5-325 MG per tablet Take 2 tablets by mouth every 4 (four) hours as needed. 10/14/13   Mariea Clonts, MD  HYDROcodone-acetaminophen (NORCO/VICODIN) 5-325 MG per tablet Take 1 tablet by mouth every 4 (four)  hours as needed for moderate pain or severe pain. 12/05/13   Tatyana A Kirichenko, PA-C  naproxen (NAPROSYN) 375 MG tablet Take 1 tablet (375 mg total) by mouth 2 (two) times daily. 11/05/13   Robbie Lis, MD   Review of Systems  Constitutional: Negative.   Eyes: Negative.   Respiratory: Positive for shortness of breath.   Cardiovascular: Positive for palpitations.  Gastrointestinal: Negative.   Genitourinary: Negative.   Musculoskeletal: Positive for myalgias.  Skin: Negative.   Neurological: Positive for headaches.  Endo/Heme/Allergies: Negative.      Objective:   Filed Vitals:   02/11/14  1700  BP: 131/85  Pulse: 106  Temp: 98.3 F (36.8 C)  TempSrc: Oral  Resp: 16  Height: 5\' 11"  (1.803 m)  Weight: 200 lb (90.719 kg)  SpO2: 99%    Exam General appearance : Awake, alert, not in any distress. Speech Clear. Not toxic looking HEENT: Atraumatic and Normocephalic, pupils equally reactive to light and accomodation Neck: supple, no JVD. No cervical lymphadenopathy.  Chest:Good air entry bilaterally, no added sounds  CVS: S1 S2 regular, no murmurs.  Abdomen: Bowel sounds present, Non tender and not distended with no gaurding, rigidity or rebound. Extremities: B/L Lower Ext shows no edema, both legs are warm to touch Neurology: Awake alert, and oriented X 3, CN II-XII intact, Non focal Skin:No Rash Wounds:N/A  Data Review No results found for this basename: HGBA1C     Assessment & Plan   1. Essential hypertension Currently well controlled. Cont current reigmen  2. Fibromyalgia Stop elavil, start cymbalta 20 mg daily and gabapentin 100 mg TID.   3. Panic attacks Will hope to manage with cymbalta. Pt encouraged to keep a log of panic attacks.  Instructed that it will take 2-4 weeks to feel full effects of medication.     The patient was given clear instructions to go to ER or return to medical center if symptoms don't improve, worsen or new problems develop. The patient verbalized understanding. The patient was told to call to get lab results if they haven't heard anything in the next week.   This note has been created with Surveyor, quantity. Any transcriptional errors are unintentional.    Isabel Anand, FNP-student   Evaluation and management procedures were performed by the Advanced Practitioner under my supervision and collaboration. I have reviewed the Advanced Practitioner's note and chart, and I agree with the management and plan.   Angelica Chessman, MD, [place], [place], [place] and  [place], [place]   02/11/2014, 5:52 PM

## 2014-02-11 NOTE — Progress Notes (Signed)
Pt is here following up on her HTN, and GERD. Pt states that for over a week she has been having pain in her left shoulder and the pain radiates down her left arm.

## 2014-02-12 ENCOUNTER — Encounter: Payer: Self-pay | Admitting: Internal Medicine

## 2014-02-12 ENCOUNTER — Telehealth: Payer: Self-pay

## 2014-02-12 NOTE — Telephone Encounter (Signed)
Patient called inquiring if she should continue taking her elevil  Spoke with Dr Doreene Burke and she should stop the medication for now

## 2014-02-18 ENCOUNTER — Encounter: Payer: Self-pay | Admitting: Internal Medicine

## 2014-02-25 ENCOUNTER — Encounter: Payer: Self-pay | Admitting: Internal Medicine

## 2014-02-28 ENCOUNTER — Other Ambulatory Visit: Payer: Self-pay | Admitting: *Deleted

## 2014-02-28 DIAGNOSIS — M797 Fibromyalgia: Secondary | ICD-10-CM

## 2014-02-28 MED ORDER — CYCLOBENZAPRINE HCL 10 MG PO TABS: 10.0000 mg | ORAL_TABLET | Freq: Two times a day (BID) | ORAL | Status: DC | PRN

## 2014-02-28 NOTE — Progress Notes (Signed)
Pt came in needing refills for her flexeril. I the the rx to her pharmacy.

## 2014-03-07 ENCOUNTER — Ambulatory Visit: Payer: No Typology Code available for payment source | Admitting: Gastroenterology

## 2014-04-19 HISTORY — PX: BREAST BIOPSY: SHX20

## 2014-05-07 ENCOUNTER — Other Ambulatory Visit: Payer: Self-pay | Admitting: Internal Medicine

## 2014-05-07 ENCOUNTER — Ambulatory Visit: Payer: Self-pay | Attending: Internal Medicine

## 2014-05-21 ENCOUNTER — Other Ambulatory Visit: Payer: Self-pay | Admitting: Internal Medicine

## 2014-05-21 DIAGNOSIS — M797 Fibromyalgia: Secondary | ICD-10-CM

## 2014-05-21 MED ORDER — DULOXETINE HCL 20 MG PO CPEP: 20.0000 mg | ORAL_CAPSULE | Freq: Every day | ORAL | Status: DC

## 2014-05-23 ENCOUNTER — Encounter: Payer: Self-pay | Admitting: Internal Medicine

## 2014-05-23 ENCOUNTER — Ambulatory Visit: Payer: Self-pay | Attending: Internal Medicine | Admitting: Internal Medicine

## 2014-05-23 VITALS — BP 124/86 | HR 102 | Temp 98.1°F | Resp 16 | Ht 69.0 in | Wt 196.0 lb

## 2014-05-23 DIAGNOSIS — Z79899 Other long term (current) drug therapy: Secondary | ICD-10-CM | POA: Insufficient documentation

## 2014-05-23 DIAGNOSIS — M797 Fibromyalgia: Secondary | ICD-10-CM | POA: Insufficient documentation

## 2014-05-23 DIAGNOSIS — R229 Localized swelling, mass and lump, unspecified: Secondary | ICD-10-CM

## 2014-05-23 DIAGNOSIS — K219 Gastro-esophageal reflux disease without esophagitis: Secondary | ICD-10-CM | POA: Insufficient documentation

## 2014-05-23 DIAGNOSIS — B070 Plantar wart: Secondary | ICD-10-CM

## 2014-05-23 DIAGNOSIS — I10 Essential (primary) hypertension: Secondary | ICD-10-CM | POA: Insufficient documentation

## 2014-05-23 DIAGNOSIS — H538 Other visual disturbances: Secondary | ICD-10-CM

## 2014-05-23 DIAGNOSIS — Z7951 Long term (current) use of inhaled steroids: Secondary | ICD-10-CM | POA: Insufficient documentation

## 2014-05-23 MED ORDER — GABAPENTIN 300 MG PO CAPS: 300.0000 mg | ORAL_CAPSULE | Freq: Three times a day (TID) | ORAL | Status: DC

## 2014-05-23 MED ORDER — ACETAMINOPHEN-CODEINE #3 300-30 MG PO TABS: 1.0000 | ORAL_TABLET | ORAL | Status: DC | PRN

## 2014-05-23 MED ORDER — DULOXETINE HCL 40 MG PO CPEP: 40.0000 mg | ORAL_CAPSULE | Freq: Every day | ORAL | Status: DC

## 2014-05-23 NOTE — Progress Notes (Signed)
Patient ID: Isabel Evans, female   DOB: 1971-03-12, 44 y.o.   MRN: [id number]   Isabel Evans, is a 44 y.o. female  IRW:[id number]  [id number]  DOB - December 31, 1970  Chief Complaint  Patient presents with  . Follow-up  . Neck Pain        Subjective:   Isabel Evans is a 44 y.o. female here today for a follow up visit. Patient has history of hypertension, irritable bowel syndrome, fibromyalgia and cervical dysplasia, here today for office visit. She is complaining of generalized body pain more like a flareup of her fibromyalgia. The duloxetine that was prescribed previously helped until lately, she has not been able to sleep well. Patient cleared for about 5 days now she has not slept, and when she dozes off she has dreams/nightmares. She does not smoke cigarettes, she does not drink alcohol. Patient is willing to try duloxetine dose adjustment. Patient has a scheduled Pap smear with gynecologist to follow-up her cervical dysplasia. Patient has No headache, No chest pain, No abdominal pain - No Nausea, No new weakness tingling or numbness, No Cough - SOB.  No problems updated.  ALLERGIES: No Known Allergies  PAST MEDICAL HISTORY: Past Medical History  Diagnosis Date  . GERD (gastroesophageal reflux disease)   . Hypertension   . Perforated ulcer     gastric  . Chronic abdominal pain   . Constipation   . IBS (irritable bowel syndrome)   . Fibromyalgia   . Cervical dysplasia     MEDICATIONS AT HOME: Prior to Admission medications   Medication Sig Start Date End Date Taking? Authorizing Provider  amLODipine (NORVASC) 10 MG tablet TAKE 1 TABLET BY MOUTH ONCE DAILY 02/11/14  Yes Tresa Garter, MD  cyclobenzaprine (FLEXERIL) 10 MG tablet Take 1 tablet (10 mg total) by mouth 2 (two) times daily as needed for muscle spasms. 02/28/14  Yes Tresa Garter, MD  DULoxetine HCl 40 MG CPEP Take 40 mg by mouth daily. 05/23/14  Yes Tresa Garter, MD  fluticasone (FLONASE) 50  MCG/ACT nasal spray INSTILL 2 SPRAYS INTO EACH NOSTRIL ONCE A DAY 11/05/13  Yes Robbie Lis, MD  gabapentin (NEURONTIN) 300 MG capsule Take 1 capsule (300 mg total) by mouth 3 (three) times daily. 05/23/14  Yes Tresa Garter, MD  omeprazole (PRILOSEC) 40 MG capsule TAKE 1 CAPSULE BY MOUTH ONCE DAILY 05/07/14  Yes Tresa Garter, MD  acetaminophen-codeine (TYLENOL #3) 300-30 MG per tablet Take 1 tablet by mouth every 4 (four) hours as needed. 05/23/14   Tresa Garter, MD  ranitidine (ZANTAC) 75 MG tablet Take 75 mg by mouth.    Historical Provider, MD  sodium chloride (OCEAN) 0.65 % nasal spray Place 1 spray into the nose as needed for congestion. Patient not taking: Reported on 05/23/2014 11/05/13   Robbie Lis, MD     Objective:   Filed Vitals:   05/23/14 1419  BP: 124/86  Pulse: 102  Temp: 98.1 F (36.7 C)  TempSrc: Oral  Resp: 16  Height: 5\' 9"  (1.753 m)  Weight: 196 lb (88.905 kg)  SpO2: 98%    Exam General appearance : Awake, alert, not in any distress. Speech Clear. Not toxic looking HEENT: Atraumatic and Normocephalic, pupils equally reactive to light and accomodation Neck: supple, no JVD. No cervical lymphadenopathy.  Chest:Good air entry bilaterally, no added sounds  CVS: S1 S2 regular, no murmurs.  Abdomen: Bowel sounds present, Non tender and not distended with no gaurding, rigidity  or rebound. Extremities: B/L Lower Ext shows no edema, both legs are warm to touch Neurology: Awake alert, and oriented X 3, CN II-XII intact, Non focal Skin:No Rash Wounds:N/A  Data Review No results found for: HGBA1C   Assessment & Plan   1. Fibromyalgia  - acetaminophen-codeine (TYLENOL #3) 300-30 MG per tablet; Take 1 tablet by mouth every 4 (four) hours as needed.  Dispense: 60 tablet; Refill: 0 - Increase from 20 mg tablet - DULoxetine HCl 40 MG CPEP; Take 40 mg by mouth daily.  Dispense: 90 capsule; Refill: 3 - Increase from 100 mg capsule - gabapentin  (NEURONTIN) 300 MG capsule; Take 1 capsule (300 mg total) by mouth 3 (three) times daily.  Dispense: 270 capsule; Refill: 3  2. Essential hypertension Blood pressure is controlled Continue amlodipine 10 mg tablet by mouth daily Follow DASH diet We discussed blood pressure goals Patient should report to the clinic if blood pressure is persistently above 140/90 mmHg   Return in about 6 months (around 11/21/2014), or if symptoms worsen or fail to improve, for Follow up Pain and comorbidities.  The patient was given clear instructions to go to ER or return to medical center if symptoms don't improve, worsen or new problems develop. The patient verbalized understanding. The patient was told to call to get lab results if they haven't heard anything in the next week.   This note has been created with Surveyor, quantity. Any transcriptional errors are unintentional.    Angelica Chessman, MD, [place] [place], [place] [facility] [place], [place]   05/23/2014, 3:03 PM

## 2014-05-23 NOTE — Patient Instructions (Signed)
Fibromyalgia Fibromyalgia is a disorder that is often misunderstood. It is associated with muscular pains and tenderness that comes and goes. It is often associated with fatigue and sleep disturbances. Though it tends to be long-lasting, fibromyalgia is not life-threatening. CAUSES  The exact cause of fibromyalgia is unknown. People with certain gene types are predisposed to developing fibromyalgia and other conditions. Certain factors can play a role as triggers, such as:  Spine disorders.  Arthritis.  Severe injury (trauma) and other physical stressors.  Emotional stressors. SYMPTOMS   The main symptom is pain and stiffness in the muscles and joints, which can vary over time.  Sleep and fatigue problems. Other related symptoms may include:  Bowel and bladder problems.  Headaches.  Visual problems.  Problems with odors and noises.  Depression or mood changes.  Painful periods (dysmenorrhea).  Dryness of the skin or eyes. DIAGNOSIS  There are no specific tests for diagnosing fibromyalgia. Patients can be diagnosed accurately from the specific symptoms they have. The diagnosis is made by determining that nothing else is causing the problems. TREATMENT  There is no cure. Management includes medicines and an active, healthy lifestyle. The goal is to enhance physical fitness, decrease pain, and improve sleep. HOME CARE INSTRUCTIONS   Only take over-the-counter or prescription medicines as directed by your caregiver. Sleeping pills, tranquilizers, and pain medicines may make your problems worse.  Low-impact aerobic exercise is very important and advised for treatment. At first, it may seem to make pain worse. Gradually increasing your tolerance will overcome this feeling.  Learning relaxation techniques and how to control stress will help you. Biofeedback, visual imagery, hypnosis, muscle relaxation, yoga, and meditation are all options.  Anti-inflammatory medicines and  physical therapy may provide short-term help.  Acupuncture or massage treatments may help.  Take muscle relaxant medicines as suggested by your caregiver.  Avoid stressful situations.  Plan a healthy lifestyle. This includes your diet, sleep, rest, exercise, and friends.  Find and practice a hobby you enjoy.  Join a fibromyalgia support group for interaction, ideas, and sharing advice. This may be helpful. SEEK MEDICAL CARE IF:  You are not having good results or improvement from your treatment. FOR MORE INFORMATION  National Fibromyalgia Association: www.fmaware.org Arthritis Foundation: www.arthritis.org Document Released: 04/05/2005 Document Revised: 06/28/2011 Document Reviewed: 07/16/2009 [employer] Patient Information 2015 [employer]. This information is not intended to replace advice given to you by your health care provider. Make sure you discuss any questions you have with your health care provider.  

## 2014-05-23 NOTE — Progress Notes (Signed)
Pt comes in today to f/u chronic pain s/p Fibromyalgia Pain starts in neck area radiating down bilat shoulders Ibuprofen 800 mg tab TID, Flexeril and Gabapentin C/o difficulty sleeping, bad dreams

## 2014-06-19 ENCOUNTER — Ambulatory Visit: Payer: No Typology Code available for payment source

## 2014-06-19 ENCOUNTER — Encounter: Payer: Self-pay | Admitting: Podiatrist

## 2014-06-19 ENCOUNTER — Ambulatory Visit: Payer: No Typology Code available for payment source | Admitting: Podiatrist

## 2014-06-19 VITALS — BP 117/76 | HR 96 | Resp 12

## 2014-06-19 DIAGNOSIS — R52 Pain, unspecified: Secondary | ICD-10-CM

## 2014-06-19 DIAGNOSIS — Q828 Other specified congenital malformations of skin: Secondary | ICD-10-CM

## 2014-06-19 NOTE — Patient Instructions (Signed)
Corns and Calluses Corns are small areas of thickened skin that usually occur on the top, sides, or tip of a toe. They contain a cone-shaped core with a point that can press on a nerve below. This causes pain. Calluses are areas of thickened skin that usually develop on hands, fingers, palms, soles of the feet, and heels. These are areas that experience frequent friction or pressure. CAUSES  Corns are usually the result of rubbing (friction) or pressure from shoes that are too tight or do not fit properly. Calluses are caused by repeated friction and pressure on the affected areas. SYMPTOMS  A hard growth on the skin.  Pain or tenderness under the skin.  Sometimes, redness and swelling.  Increased discomfort while wearing tight-fitting shoes. DIAGNOSIS  Your caregiver can usually tell what the problem is by doing a physical exam. TREATMENT  Removing the cause of the friction or pressure is usually the only treatment needed. However, sometimes medicines can be used to help soften the hardened, thickened areas. These medicines include salicylic acid plasters and 12% ammonium lactate lotion. These medicines should only be used under the direction of your caregiver. HOME CARE INSTRUCTIONS   Try to remove pressure from the affected area.  You may wear donut-shaped corn pads to protect your skin.  You may use a pumice stone or nonmetallic nail file to gently reduce the thickness of a corn.  Wear properly fitted footwear.  If you have calluses on the hands, wear gloves during activities that cause friction.  If you have diabetes, you should regularly examine your feet. Tell your caregiver if you notice any problems with your feet. SEEK IMMEDIATE MEDICAL CARE IF:   You have increased pain, swelling, redness, or warmth in the affected area.  Your corn or callus starts to drain fluid or bleeds.  You are not getting better, even with treatment. Document Released: 01/10/2004 Document  Revised: 06/28/2011 Document Reviewed: 12/01/2010 [employer] Patient Information 2015 [employer]. This information is not intended to replace advice given to you by your health care provider. Make sure you discuss any questions you have with your health care provider.  

## 2014-06-19 NOTE — Progress Notes (Signed)
   Subjective:    Patient ID: Isabel Evans, female    DOB: 25-Nov-1970, 44 y.o.   MRN: [id number]  HPI  PT STATED B/L BUNION BEEN HURTING FOR 6 MONTHS. FEET ARE GETTING WORSE, ESPECIALLY WHEN WEARING CLOSED SHOES. TRIED NO TREATMENT.  ALSO, RT FOOT HAVE CALLUS/WARTS.  Review of Systems  All other systems reviewed and are negative.      Objective:   Physical Exam Patient is awake, alert, and oriented x 3.  In no acute distress.  Vascular status is intact with palpable pedal pulses at 2/4 DP and PT bilateral and capillary refill time within normal limits. Neurological sensation is also intact bilaterally via Semmes Weinstein monofilament at 5/5 sites. Light touch, vibratory sensation, Achilles tendon reflex is intact. Dermatological exam reveals well circumscribed porokeratotic lesion present at the fifth metatarsal head of the right foot. Intact integument is noted with a waxy base present. Otherwise skin color, turger and texture as normal.  Musculature intact with dorsiflexion, plantarflexion, inversion, eversion. Bunion and tailor's bunion deformity are noted right greater than left foot. Discomfort knees areas is also present when pressed. X-rays confirm bunion and tailor's bunion deformity bilateral feet.     Assessment & Plan:  Porokeratotic lesion submetatarsal 5 right foot, bunion bilateral and tailor's bunion bilateral  Plan: Debridement of the porokeratotic lesion was accomplished. Discussed ultimately she would need surgery to correct the tailor's bunion in order to have a chance at getting rid of this lesion. 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

## 2014-06-26 ENCOUNTER — Other Ambulatory Visit: Payer: Self-pay | Admitting: Internal Medicine

## 2014-06-26 ENCOUNTER — Ambulatory Visit (HOSPITAL_COMMUNITY)
Admission: RE | Admit: 2014-06-26 | Discharge: 2014-06-26 | Disposition: A | Discharge status: Home / Self Care | Payer: Self-pay | Source: Ambulatory Visit | Attending: Internal Medicine | Admitting: Internal Medicine

## 2014-06-26 DIAGNOSIS — R928 Other abnormal and inconclusive findings on diagnostic imaging of breast: Secondary | ICD-10-CM

## 2014-06-26 DIAGNOSIS — Z1239 Encounter for other screening for malignant neoplasm of breast: Secondary | ICD-10-CM

## 2014-06-27 ENCOUNTER — Other Ambulatory Visit: Payer: Self-pay | Admitting: Internal Medicine

## 2014-06-28 ENCOUNTER — Telehealth: Payer: Self-pay | Admitting: Emergency Medicine

## 2014-06-28 ENCOUNTER — Telehealth: Payer: Self-pay | Admitting: Internal Medicine

## 2014-06-28 NOTE — Telephone Encounter (Signed)
-----   Message from Tresa Garter, MD sent at 06/26/2014  5:17 PM EST ----- Please inform patient that her screening mammogram needs further evaluation for possible distortion in the left breast. We will order ultrasound of the left breast and diagnostic mammogram.  I have placed the orders, please schedule and inform patient.

## 2014-06-28 NOTE — Telephone Encounter (Signed)
Patient received a call from nurse this morning but could not get to the phone. She has questions about her mammogram results. Please follow up with pt.

## 2014-06-28 NOTE — Telephone Encounter (Signed)
Left message for pt to call for results  

## 2014-07-01 ENCOUNTER — Telehealth: Payer: Self-pay | Admitting: Emergency Medicine

## 2014-07-01 NOTE — Telephone Encounter (Signed)
Pt already given results with scheduled ultrasound

## 2014-07-08 ENCOUNTER — Other Ambulatory Visit: Payer: Self-pay | Admitting: Internal Medicine

## 2014-07-09 ENCOUNTER — Ambulatory Visit: Payer: Self-pay | Attending: Internal Medicine | Admitting: Family Medicine

## 2014-07-09 VITALS — BP 121/84 | HR 86 | Temp 97.9°F | Resp 16 | Ht 69.0 in | Wt 196.0 lb

## 2014-07-09 DIAGNOSIS — J069 Acute upper respiratory infection, unspecified: Secondary | ICD-10-CM

## 2014-07-09 MED ORDER — GUAIFENESIN-CODEINE 100-10 MG/5ML PO SOLN: 10.0000 mL | Freq: Three times a day (TID) | ORAL | Status: DC | PRN

## 2014-07-09 NOTE — Progress Notes (Signed)
Patient has history of allergies and takes Flonase and Claritin q day.  Symptoms started on Sunday-sore throat, cough, nasal congestions and pressure on left side. Left ear pain and patient states when she coughs her head hurts.Patient has taken Nyquil last night and her allergy meds.

## 2014-07-09 NOTE — Progress Notes (Signed)
Subjective:     Patient ID: Isabel Evans, female   DOB: Apr 03, 1971, 44 y.o.   MRN: [id number]  HPI Patient presents with a 2 day history of ST, nasal congestion, and cough. She denies fever or chills. The cough is the worse part. It is dry and repetitive.  Review of Systems :  See HPI     Objective:   Physical Exam   TM on right is mildy pink.  Conjunctiva clear, nose congested, throat with generalized erythema. Neck is supple FROM w/o adenopathy or tenderness. Lungs are clear to auscultation. HS are regular.Ear canal on Left with significant wax     Assessment:     Upper Respiratory Infection    Plan:     Symptomatic measures as desired. guainfenesin AC 10 ml tid prn Follow-up prn

## 2014-07-09 NOTE — Patient Instructions (Signed)
May use otc medications for colds Have prescribed a codiene based cough syrup for severe cough. Lots of liquids.  Follow up if symptoms worsening after next Sunday.   Marland KitchenUpper Respiratory Infection, Adult An upper respiratory infection (URI) is also sometimes known as the common cold. The upper respiratory tract includes the nose, sinuses, throat, trachea, and bronchi. Bronchi are the airways leading to the lungs. Most people improve within 1 week, but symptoms can last up to 2 weeks. A residual cough may last even longer.  CAUSES Many different viruses can infect the tissues lining the upper respiratory tract. The tissues become irritated and inflamed and often become very moist. Mucus production is also common. A cold is contagious. You can easily spread the virus to others by oral contact. This includes kissing, sharing a glass, coughing, or sneezing. Touching your mouth or nose and then touching a surface, which is then touched by another person, can also spread the virus. SYMPTOMS  Symptoms typically develop 1 to 3 days after you come in contact with a cold virus. Symptoms vary from person to person. They may include:  Runny nose.  Sneezing.  Nasal congestion.  Sinus irritation.  Sore throat.  Loss of voice (laryngitis).  Cough.  Fatigue.  Muscle aches.  Loss of appetite.  Headache.  Low-grade fever. DIAGNOSIS  You might diagnose your own cold based on familiar symptoms, since most people get a cold 2 to 3 times a year. Your caregiver can confirm this based on your exam. Most importantly, your caregiver can check that your symptoms are not due to another disease such as strep throat, sinusitis, pneumonia, asthma, or epiglottitis. Blood tests, throat tests, and X-rays are not necessary to diagnose a common cold, but they may sometimes be helpful in excluding other more serious diseases. Your caregiver will decide if any further tests are required. RISKS AND COMPLICATIONS  You  may be at risk for a more severe case of the common cold if you smoke cigarettes, have chronic heart disease (such as heart failure) or lung disease (such as asthma), or if you have a weakened immune system. The very young and very old are also at risk for more serious infections. Bacterial sinusitis, middle ear infections, and bacterial pneumonia can complicate the common cold. The common cold can worsen asthma and chronic obstructive pulmonary disease (COPD). Sometimes, these complications can require emergency medical care and may be life-threatening. PREVENTION  The best way to protect against getting a cold is to practice good hygiene. Avoid oral or hand contact with people with cold symptoms. Wash your hands often if contact occurs. There is no clear evidence that vitamin C, vitamin E, echinacea, or exercise reduces the chance of developing a cold. However, it is always recommended to get plenty of rest and practice good nutrition. TREATMENT  Treatment is directed at relieving symptoms. There is no cure. Antibiotics are not effective, because the infection is caused by a virus, not by bacteria. Treatment may include:  Increased fluid intake. Sports drinks offer valuable electrolytes, sugars, and fluids.  Breathing heated mist or steam (vaporizer or shower).  Eating chicken soup or other clear broths, and maintaining good nutrition.  Getting plenty of rest.  Using gargles or lozenges for comfort.  Controlling fevers with ibuprofen or acetaminophen as directed by your caregiver.  Increasing usage of your inhaler if you have asthma. Zinc gel and zinc lozenges, taken in the first 24 hours of the common cold, can shorten the duration and lessen  the severity of symptoms. Pain medicines may help with fever, muscle aches, and throat pain. A variety of non-prescription medicines are available to treat congestion and runny nose. Your caregiver can make recommendations and may suggest nasal or lung  inhalers for other symptoms.  HOME CARE INSTRUCTIONS   Only take over-the-counter or prescription medicines for pain, discomfort, or fever as directed by your caregiver.  Use a warm mist humidifier or inhale steam from a shower to increase air moisture. This may keep secretions moist and make it easier to breathe.  Drink enough water and fluids to keep your urine clear or pale yellow.  Rest as needed.  Return to work when your temperature has returned to normal or as your caregiver advises. You may need to stay home longer to avoid infecting others. You can also use a face mask and careful hand washing to prevent spread of the virus. SEEK MEDICAL CARE IF:   After the first few days, you feel you are getting worse rather than better.  You need your caregiver's advice about medicines to control symptoms.  You develop chills, worsening shortness of breath, or brown or red sputum. These may be signs of pneumonia.  You develop yellow or brown nasal discharge or pain in the face, especially when you bend forward. These may be signs of sinusitis.  You develop a fever, swollen neck glands, pain with swallowing, or white areas in the back of your throat. These may be signs of strep throat. SEEK IMMEDIATE MEDICAL CARE IF:   You have a fever.  You develop severe or persistent headache, ear pain, sinus pain, or chest pain.  You develop wheezing, a prolonged cough, cough up blood, or have a change in your usual mucus (if you have chronic lung disease).  You develop sore muscles or a stiff neck. Document Released: 09/29/2000 Document Revised: 06/28/2011 Document Reviewed: 07/11/2013 [facility] Patient Information 2015 [place], [place]. This information is not intended to replace advice given to you by your health care provider. Make sure you discuss any questions you have with your health care provider.

## 2014-07-10 ENCOUNTER — Other Ambulatory Visit: Payer: Self-pay | Admitting: Internal Medicine

## 2014-07-11 ENCOUNTER — Encounter (HOSPITAL_COMMUNITY): Payer: Self-pay

## 2014-07-11 ENCOUNTER — Ambulatory Visit (HOSPITAL_COMMUNITY)
Admission: RE | Admit: 2014-07-11 | Discharge: 2014-07-11 | Disposition: A | Discharge status: Home / Self Care | Payer: Self-pay | Source: Ambulatory Visit | Attending: Obstetrics and Gynecology | Admitting: Obstetrics and Gynecology

## 2014-07-11 VITALS — BP 112/74 | Temp 98.4°F | Ht 69.5 in | Wt 194.4 lb

## 2014-07-11 DIAGNOSIS — Z1239 Encounter for other screening for malignant neoplasm of breast: Secondary | ICD-10-CM

## 2014-07-11 NOTE — Patient Instructions (Signed)
Educational materials on self-breast awareness given. Explained to Isabel Evans that she did not need a Pap smear today due to last Pap smear was 11/22/2013. Let her know that her next Pap smear is due in August due to her recent history of an abnormal Pap smear. Told patient to call Gabriel Cirri in June or July to schedule appointment with [facility] for Pap smear. Referred patient to the [facility] for left breast diagnostic mammogram and possible ultrasound per recommendation. Appointment scheduled for Monday, July 15, 2014 at 0900. Patient aware of appointment and will be there. Isabel Evans verbalized understanding.  [place], Arvil Chaco, RN 3:35 PM

## 2014-07-11 NOTE — Progress Notes (Signed)
Patient referred to [facility] by the [facility] due to recommending additional imaging of the left breast. Screening mammogram completed 06/26/2014 at [facility]. Patient complained of left upper breast soreness that comes and goes. Patient rates pain at a 3-4 out of 10.  Pap Smear:  Pap smear not completed today. Last Pap smear was 11/22/2013 at [facility] and normal with negative HPV. Patient has a history of an abnormal Pap smear 07/13/2012 that was LSIL and a colposcopy was completed for follow-up 08/02/2012 that was CIN-I. Pap smear and colposcopy results above are in EPIC.  Physical exam: Breasts Breasts symmetrical. No skin abnormalities bilateral breasts. No nipple retraction bilateral breasts. No nipple discharge bilateral breasts. No lymphadenopathy. No lumps palpated bilateral breasts. Complaints of left upper breast tenderness on exam. Referred patient to the [facility] for left breast diagnostic mammogram and possible ultrasound per recommendation. Appointment scheduled for Monday, July 15, 2014 at 0900.   Pelvic/Bimanual No Pap smear completed today since last Pap smear was 11/22/2013. Pap smear not indicated per BCCCP guidelines.

## 2014-07-15 ENCOUNTER — Other Ambulatory Visit: Payer: Self-pay | Admitting: Internal Medicine

## 2014-07-15 ENCOUNTER — Ambulatory Visit
Admission: RE | Admit: 2014-07-15 | Discharge: 2014-07-15 | Disposition: A | Discharge status: Home / Self Care | Payer: No Typology Code available for payment source | Source: Ambulatory Visit | Attending: Internal Medicine | Admitting: Internal Medicine

## 2014-07-15 DIAGNOSIS — N6320 Unspecified lump in the left breast, unspecified quadrant: Secondary | ICD-10-CM

## 2014-07-15 DIAGNOSIS — R928 Other abnormal and inconclusive findings on diagnostic imaging of breast: Secondary | ICD-10-CM

## 2014-07-16 ENCOUNTER — Telehealth: Payer: Self-pay | Admitting: *Deleted

## 2014-07-16 NOTE — Telephone Encounter (Signed)
"  I'm calling to schedule my surgery.  Dr. Valentina Lucks told me to call when I'm ready to schedule."  Have you signed consent forms?  "What's that?"  Forms giving her permission to perform surgery, goes over complications that could occur and a diagram of what she will be doing.  "No, I haven't done that."  When would you like to have surgery?  "I want to do it some time in May."  Dr. Valentina Lucks will not be doing surgery then.  Let me see who she wants to refer you to and we will call you back to schedule a consultation with that doctor.  "Okay, that will be fine."

## 2014-07-18 ENCOUNTER — Ambulatory Visit
Admission: RE | Admit: 2014-07-18 | Discharge: 2014-07-18 | Disposition: A | Discharge status: Home / Self Care | Payer: No Typology Code available for payment source | Source: Ambulatory Visit | Attending: Internal Medicine | Admitting: Internal Medicine

## 2014-07-18 DIAGNOSIS — N6320 Unspecified lump in the left breast, unspecified quadrant: Secondary | ICD-10-CM

## 2014-07-18 NOTE — Telephone Encounter (Signed)
You may put her with any of the docs- they will all do an excellent job.  I'd go with the first available.  [place], [place] or [place].  Thanks!

## 2014-07-24 ENCOUNTER — Ambulatory Visit: Payer: Self-pay | Admitting: Obstetrics & Gynecology

## 2014-08-05 ENCOUNTER — Other Ambulatory Visit: Payer: Self-pay | Admitting: Family Medicine

## 2014-08-05 ENCOUNTER — Other Ambulatory Visit: Payer: Self-pay | Admitting: Internal Medicine

## 2014-08-05 DIAGNOSIS — M797 Fibromyalgia: Secondary | ICD-10-CM

## 2014-08-07 ENCOUNTER — Telehealth: Payer: Self-pay | Admitting: General Practice

## 2014-08-07 NOTE — Telephone Encounter (Signed)
Patient presents to clinic to review her medication for Cymbalta. Patient states for the last month, she has been taking 3 capsules (60 mg) after reviewing her prescription RMA, Junious Dresser confirmed that she should only be taking 2 capsules. Informed patient. Patient states moving forward she will take 2 capsules, as instructed. Patient worried that her adjusting the meds might "throw her off". Please contact patient to reassure.

## 2014-08-07 NOTE — Telephone Encounter (Signed)
Patient is requesting a refill on her cough syrup with codeine Are you willing to refill this medication? If so can you send a prescription to the pharmacy

## 2014-08-09 NOTE — Telephone Encounter (Signed)
Per Sheppard Penton refill is to be given for cough syrup. She will need to make an appointment.

## 2014-08-12 ENCOUNTER — Encounter: Payer: Self-pay | Admitting: Internal Medicine

## 2014-08-12 ENCOUNTER — Ambulatory Visit: Payer: Self-pay | Attending: Internal Medicine | Admitting: Internal Medicine

## 2014-08-12 VITALS — BP 115/78 | HR 90 | Temp 98.0°F | Resp 16 | Ht 69.5 in | Wt 194.0 lb

## 2014-08-12 DIAGNOSIS — M24549 Contracture, unspecified hand: Secondary | ICD-10-CM

## 2014-08-12 DIAGNOSIS — M545 Low back pain, unspecified: Secondary | ICD-10-CM

## 2014-08-12 DIAGNOSIS — M797 Fibromyalgia: Secondary | ICD-10-CM

## 2014-08-12 DIAGNOSIS — M151 Heberden's nodes (with arthropathy): Secondary | ICD-10-CM

## 2014-08-12 DIAGNOSIS — I10 Essential (primary) hypertension: Secondary | ICD-10-CM

## 2014-08-12 MED ORDER — ACETAMINOPHEN-CODEINE #3 300-30 MG PO TABS: 1.0000 | ORAL_TABLET | ORAL | Status: DC | PRN

## 2014-08-12 NOTE — Patient Instructions (Signed)
Fibromyalgia Fibromyalgia is a disorder that is often misunderstood. It is associated with muscular pains and tenderness that comes and goes. It is often associated with fatigue and sleep disturbances. Though it tends to be long-lasting, fibromyalgia is not life-threatening. CAUSES  The exact cause of fibromyalgia is unknown. People with certain gene types are predisposed to developing fibromyalgia and other conditions. Certain factors can play a role as triggers, such as:  Spine disorders.  Arthritis.  Severe injury (trauma) and other physical stressors.  Emotional stressors. SYMPTOMS   The main symptom is pain and stiffness in the muscles and joints, which can vary over time.  Sleep and fatigue problems. Other related symptoms may include:  Bowel and bladder problems.  Headaches.  Visual problems.  Problems with odors and noises.  Depression or mood changes.  Painful periods (dysmenorrhea).  Dryness of the skin or eyes. DIAGNOSIS  There are no specific tests for diagnosing fibromyalgia. Patients can be diagnosed accurately from the specific symptoms they have. The diagnosis is made by determining that nothing else is causing the problems. TREATMENT  There is no cure. Management includes medicines and an active, healthy lifestyle. The goal is to enhance physical fitness, decrease pain, and improve sleep. HOME CARE INSTRUCTIONS   Only take over-the-counter or prescription medicines as directed by your caregiver. Sleeping pills, tranquilizers, and pain medicines may make your problems worse.  Low-impact aerobic exercise is very important and advised for treatment. At first, it may seem to make pain worse. Gradually increasing your tolerance will overcome this feeling.  Learning relaxation techniques and how to control stress will help you. Biofeedback, visual imagery, hypnosis, muscle relaxation, yoga, and meditation are all options.  Anti-inflammatory medicines and  physical therapy may provide short-term help.  Acupuncture or massage treatments may help.  Take muscle relaxant medicines as suggested by your caregiver.  Avoid stressful situations.  Plan a healthy lifestyle. This includes your diet, sleep, rest, exercise, and friends.  Find and practice a hobby you enjoy.  Join a fibromyalgia support group for interaction, ideas, and sharing advice. This may be helpful. SEEK MEDICAL CARE IF:  You are not having good results or improvement from your treatment. FOR MORE INFORMATION  National Fibromyalgia Association: www.fmaware.org Arthritis Foundation: www.arthritis.org Document Released: 04/05/2005 Document Revised: 06/28/2011 Document Reviewed: 07/16/2009 [employer] Patient Information 2015 [employer]. This information is not intended to replace advice given to you by your health care provider. Make sure you discuss any questions you have with your health care provider.  

## 2014-08-12 NOTE — Progress Notes (Signed)
Pt is here following up on her HTN and her fibromyalgia. Pt states that there is a lump on the inside of her left arm.

## 2014-08-12 NOTE — Progress Notes (Signed)
Patient ID: Isabel Evans, female   DOB: [birth year], 44 y.o.   MRN: [id number]   Isabel Evans, is a 44 y.o. female  WPY:[id number]  KNL:[id number]  DOB - 1970/05/16  Chief Complaint  Patient presents with  . Follow-up        Subjective:   Isabel Evans is a 44 y.o. female here today for a follow up visit. She has medical history significant for hypertension, fibromyalgia, irritable bowel syndrome, chronic abdominal pain, chronic pain syndrome and GERD. She is here today for follow-up of hypertension and fibromyalgia. She needs medication refill for pain. Patient is complaining of a lump on the inside of her left arm that looks like a node, she would like it removed. This lump causes pain when touched but no change in color, no redness. Patient has No headache, No chest pain, No abdominal pain - No Nausea, No new weakness tingling or numbness, No Cough - SOB.  Problem  Midline Low Back Pain Without Sciatica  Heberden Nodes  Contracture of Joint, Hand    ALLERGIES: No Known Allergies  PAST MEDICAL HISTORY: Past Medical History  Diagnosis Date  . GERD (gastroesophageal reflux disease)   . Hypertension   . Perforated ulcer     gastric  . Chronic abdominal pain   . Constipation   . IBS (irritable bowel syndrome)   . Fibromyalgia   . Cervical dysplasia     MEDICATIONS AT HOME: Prior to Admission medications   Medication Sig Start Date End Date Taking? Authorizing Provider  amLODipine (NORVASC) 10 MG tablet TAKE 1 TABLET BY MOUTH ONCE DAILY 02/11/14  Yes Chestine Belknap E Doreene Burke, MD  cyclobenzaprine (FLEXERIL) 10 MG tablet TAKE 1 TABLET BY MOUTH TWICE DAILY AS NEEDED FOR MUSCLE SPASMS 07/10/14  Yes Tresa Garter, MD  fluticasone (FLONASE) 50 MCG/ACT nasal spray INSTILL 2 SPRAYS INTO EACH NOSTRIL ONCE A DAY 11/05/13  Yes Robbie Lis, MD  gabapentin (NEURONTIN) 300 MG capsule Take 1 capsule (300 mg total) by mouth 3 (three) times daily. 05/23/14  Yes Tresa Garter, MD    omeprazole (PRILOSEC) 40 MG capsule TAKE 1 CAPSULE BY MOUTH ONCE DAILY 05/07/14  Yes Tresa Garter, MD  acetaminophen-codeine (TYLENOL #3) 300-30 MG per tablet Take 1 tablet by mouth every 4 (four) hours as needed. 08/12/14   Tresa Garter, MD  DULoxetine HCl 40 MG CPEP Take 40 mg by mouth daily. Patient not taking: Reported on 08/12/2014 05/23/14   Tresa Garter, MD  guaiFENesin-codeine 100-10 MG/5ML syrup Take 10 mLs by mouth 3 (three) times daily as needed for cough. Patient not taking: Reported on 08/12/2014 07/09/14   Micheline Chapman, NP  ranitidine (ZANTAC) 75 MG tablet Take 75 mg by mouth.    Historical Provider, MD  sodium chloride (OCEAN) 0.65 % nasal spray Place 1 spray into the nose as needed for congestion. Patient not taking: Reported on 08/12/2014 11/05/13   Robbie Lis, MD     Objective:   Filed Vitals:   08/12/14 1706  BP: 115/78  Pulse: 90  Temp: 98 F (36.7 C)  TempSrc: Oral  Resp: 16  Height: 5' 9.5" (1.765 m)  Weight: 194 lb (87.998 kg)  SpO2: 97%    Exam General appearance : Awake, alert, not in any distress. Speech Clear. Not toxic looking HEENT: Atraumatic and Normocephalic, pupils equally reactive to light and accomodation Neck: supple, no JVD. No cervical lymphadenopathy.  Chest:Good air entry bilaterally, no added sounds  CVS: S1 S2  regular, no murmurs.  Abdomen: Bowel sounds present, Non tender and not distended with no gaurding, rigidity or rebound. Extremities: B/L Lower Ext shows no edema, both legs are warm to touch Neurology: Awake alert, and oriented X 3, CN II-XII intact, Non focal Left arm has a nodule that looks like Heberden nodes. Data Review No results found for: HGBA1C   Assessment & Plan   1. Fibromyalgia  - acetaminophen-codeine (TYLENOL #3) 300-30 MG per tablet; Take 1 tablet by mouth every 4 (four) hours as needed.  Dispense: 60 tablet; Refill: 1  2. Essential hypertension  We have discussed target BP range  and blood pressure goal. I have advised patient to check BP regularly and to call us back or report to clinic if the numbers are consistently higher than 140/90. We discussed the importance of compliance with medical therapy and DASH diet recommended, consequences of uncontrolled hypertension discussed.  - continue current BP medications  3. Midline low back pain without sciatica Continue Tylenol 3  4. Heberden nodes  - Ambulatory referral to Hand Surgery  5. Contracture of joint, hand, unspecified laterality  - Ambulatory referral to Hand Surgery  Patient have been counseled extensively about nutrition and exercise  Return in about 6 months (around 02/11/2015), or if symptoms worsen or fail to improve, for Follow up Pain and comorbidities, Follow up HTN.  The patient was given clear instructions to go to ER or return to medical center if symptoms don't improve, worsen or new problems develop. The patient verbalized understanding. The patient was told to call to get lab results if they haven't heard anything in the next week.   This note has been created with Surveyor, quantity. Any transcriptional errors are unintentional.    Angelica Chessman, MD, [place], [place], [place], [place] and [place], [place]   08/12/2014, 5:46 PM

## 2014-08-21 ENCOUNTER — Encounter: Payer: Self-pay | Admitting: Podiatry

## 2014-08-21 ENCOUNTER — Ambulatory Visit: Payer: No Typology Code available for payment source | Admitting: Podiatry

## 2014-08-21 VITALS — BP 104/57 | HR 86 | Resp 18

## 2014-08-21 DIAGNOSIS — M2011 Hallux valgus (acquired), right foot: Secondary | ICD-10-CM

## 2014-08-21 DIAGNOSIS — M205X1 Other deformities of toe(s) (acquired), right foot: Secondary | ICD-10-CM

## 2014-08-21 DIAGNOSIS — M2041 Other hammer toe(s) (acquired), right foot: Secondary | ICD-10-CM

## 2014-08-21 NOTE — Patient Instructions (Signed)
Pre-Operative Instructions  Congratulations, you have decided to take an important step to improving your quality of life.  You can be assured that the doctors of [facility] will be with you every step of the way.  1. Plan to be at the surgery center/hospital at least 1 (one) hour prior to your scheduled time unless otherwise directed by the surgical center/hospital staff.  You must have a responsible adult accompany you, remain during the surgery and drive you home.  Make sure you have directions to the surgical center/hospital and know how to get there on time. 2. For hospital based surgery you will need to obtain a history and physical form from your family physician within 1 month prior to the date of surgery- we will give you a form for you primary physician.  3. We make every effort to accommodate the date you request for surgery.  There are however, times where surgery dates or times have to be moved.  We will contact you as soon as possible if a change in schedule is required.   4. No Aspirin/Ibuprofen for one week before surgery.  If you are on aspirin, any non-steroidal anti-inflammatory medications (Mobic, Aleve, Ibuprofen) you should stop taking it 7 days prior to your surgery.  You make take Tylenol  For pain prior to surgery.  5. Medications- If you are taking daily heart and blood pressure medications, seizure, reflux, allergy, asthma, anxiety, pain or diabetes medications, make sure the surgery center/hospital is aware before the day of surgery so they may notify you which medications to take or avoid the day of surgery. 6. No food or drink after midnight the night before surgery unless directed otherwise by surgical center/hospital staff. 7. No alcoholic beverages 24 hours prior to surgery.  No smoking 24 hours prior to or 24 hours after surgery. 8. Wear loose pants or shorts- loose enough to fit over bandages, boots, and casts. 9. No slip on shoes, sneakers are best. 10. Bring  your boot with you to the surgery center/hospital.  Also bring crutches or a walker if your physician has prescribed it for you.  If you do not have this equipment, it will be provided for you after surgery. 11. If you have not been contracted by the surgery center/hospital by the day before your surgery, call to confirm the date and time of your surgery. 12. Leave-time from work may vary depending on the type of surgery you have.  Appropriate arrangements should be made prior to surgery with your employer. 13. Prescriptions will be provided immediately following surgery by your doctor.  Have these filled as soon as possible after surgery and take the medication as directed. 14. Remove nail polish on the operative foot. 15. Wash the night before surgery.  The night before surgery wash the foot and leg well with the antibacterial soap provided and water paying special attention to beneath the toenails and in between the toes.  Rinse thoroughly with water and dry well with a towel.  Perform this wash unless told not to do so by your physician.  Enclosed: 1 Ice pack (please put in freezer the night before surgery)   1 Hibiclens skin cleaner   Pre-op Instructions  If you have any questions regarding the instructions, do not hesitate to call our office.  [place]: [address] [place], [place] [address] [phone]  [place]: [address] [place], [place] [address] [phone]  [place]: [address]  [place], [place] [address] [phone]  Dr. Richard   Tuchman DPM, Dr. Norman Regal DPM Dr. Richard Sikora DPM, Dr. M. Todd Hyatt DPM, Dr. Kathryn Egerton DPM, Dr. Ryken Paschal DPM 

## 2014-08-22 NOTE — Progress Notes (Signed)
Patient ID: Isabel Evans, female   DOB: 1970-11-02, 44 y.o.   MRN: [id number]  Subjective: 44 year old female presents the office today for surgical consultation for symptomatic tailor's bunion as well as bunion deformity. She states that she has pain to both areas particularly shoe gear. She also states that her fourth and fifth toes curl inwards causing pain with pressure and walking. She states that she has tried various padding, offloading in shoe gear modifications without any resolution of symptoms. She states that she has pain in both of her feet but the right is more significant in the left. At this time she is requesting surgical intervention to help decrease her pain and deformity. Denies any systemic complaints such as fevers, chills, nausea, vomiting. No acute changes since last appointment, and no other complaints at this time.   Objective: AAO x3, NAD DP/PT pulses palpable bilaterally, CRT less than 3 seconds Protective sensation intact with Simms Weinstein monofilament, vibratory sensation intact, Achilles tendon reflex intact There is moderate partial HAV deformity as well as a Taylor's bunion deformity bilaterally. There is irritation overlying both prominences off the medial aspect of the first metatarsal head and the lateral aspect of the fifth metatarsal head from shoe gear. There is tenderness to palpation overlying these areas. The right side is more symptomatic than the left. There is also hammertoe, adductovarus deformities of the fourth and fifth digits with tenderness overlying the DIPJ's. There is no other areas of tenderness to bilateral lower extremities. MMT 5/5, ROM WNL. This a small hyperkeratotic lesion right submetatarsal 5. No edema, erythema, increase in warmth to bilateral lower extremities.  No open lesions or pre-ulcerative lesions.  No pain with calf compression, swelling, warmth, erythema  Assessment: 44 year old female with symptomatic bunion, tailor's  bunion, fourth and fifth digit toes right side  Plan: -All treatment options discussed with the patient including all alternatives, risks, complications.  -At this time as the patient is attended conservative treatment without any resolution she is a question surgical intervention. -I discussed the patient Austin bunionectomy, tailor's bunionectomy, hammertoe repair of the 4th and 5th digit.  -The incision placement as well as the postoperative course was discussed with the patient. I discussed risks of the surgery which include, but not limited to, infection, bleeding, pain, swelling, need for further surgery, delayed or nonhealing, painful or ugly scar, numbness or sensation changes, over/under correction, recurrence, transfer lesions, further deformity, hardware failure, DVT/PE, loss of toe/foot. Patient understands these risks and wishes to proceed with surgery. The surgical consent was reviewed with the patient all 3 pages were signed. No promises or guarantees were given to the outcome of the procedure. All questions were answered to the best of my ability. Before the surgery the patient was encouraged to call the office if there is any further questions. The surgery will be performed at [facility] on an outpatient basis. -Prior to surgery the patient will need a history of physical performed by her primary care physician. The papers were given to the patient at today's appointment. -Patient encouraged to call the office with any questions, concerns, change in symptoms.

## 2014-09-06 ENCOUNTER — Telehealth: Payer: Self-pay | Admitting: *Deleted

## 2014-09-06 NOTE — Telephone Encounter (Signed)
I left patient a message to call me back.  I need to reschedule her surgery from 09/23/2014 to 10/14/2014 at [facility] per Dr. Jacqualyn Posey.  He has surgeries scheduled already at [facility] on that particular day.

## 2014-09-10 ENCOUNTER — Telehealth: Payer: Self-pay | Admitting: Internal Medicine

## 2014-09-10 ENCOUNTER — Other Ambulatory Visit: Payer: Self-pay | Admitting: Internal Medicine

## 2014-09-10 NOTE — Telephone Encounter (Signed)
Patient has come in today to see if she can receive a medication refill for amlodipine; please f/u with patient

## 2014-09-10 NOTE — Telephone Encounter (Signed)
Patient has come in today to see if she can schedule an appointment for a physical with PAP; Patient is having surgery on Tuesday and would like to know if there is a way she can receive a physical before hand so she doesn't have to reschedule; please f/u with patient

## 2014-09-17 ENCOUNTER — Other Ambulatory Visit: Payer: Self-pay

## 2014-09-17 MED ORDER — OMEPRAZOLE 40 MG PO CPDR
40.0000 mg | DELAYED_RELEASE_CAPSULE | Freq: Every day | ORAL | Status: DC
Start: 2014-09-17 — End: 2015-02-21

## 2014-09-18 DIAGNOSIS — M2010 Hallux valgus (acquired), unspecified foot: Secondary | ICD-10-CM

## 2014-09-18 DIAGNOSIS — M2040 Other hammer toe(s) (acquired), unspecified foot: Secondary | ICD-10-CM

## 2014-09-18 HISTORY — DX: Hallux valgus (acquired), unspecified foot: M20.10

## 2014-09-18 HISTORY — DX: Other hammer toe(s) (acquired), unspecified foot: M20.40

## 2014-09-19 ENCOUNTER — Ambulatory Visit: Payer: Self-pay | Attending: Internal Medicine | Admitting: Internal Medicine

## 2014-09-19 ENCOUNTER — Encounter: Payer: Self-pay | Admitting: Internal Medicine

## 2014-09-19 VITALS — BP 120/83 | HR 76 | Temp 98.0°F | Ht 69.5 in | Wt 192.6 lb

## 2014-09-19 DIAGNOSIS — Z01818 Encounter for other preprocedural examination: Secondary | ICD-10-CM | POA: Insufficient documentation

## 2014-09-19 DIAGNOSIS — Z124 Encounter for screening for malignant neoplasm of cervix: Secondary | ICD-10-CM

## 2014-09-19 NOTE — Progress Notes (Signed)
Subjective:     Patient ID: Isabel Evans, female   DOB: 12/15/1970, 44 y.o.   MRN: [id number]  HPI  Isabel Evans is a 44 yo female with history of hypertension, fibromyalgia, osteoarthritis, and LGSIL here today for a pap smear and preop paperwork for bunyon surgery scheduled for 09/23/14. Patient has no complaints today.  Patient has history of abnormal pap smear on 08/15 that showed LGSIL. Gynecologist advised watch and wait with regular follow up pap smear. Patient is sexually active with only one female sexual partner. No history of STD. No dyspareunia or postcoital bleeding.   Active Ambulatory Problems    Diagnosis Date Noted  . Hypertension 07/14/2012  . Neck muscle spasm 07/14/2012  . Plantar wart 07/14/2012  . Insomnia 07/14/2012  . Fibromyalgia 09/08/2012  . Osteoarthritis 09/08/2012  . Multiple skin nodules 11/27/2012  . Back pain 05/21/2013  . UTI (urinary tract infection) 05/21/2013  . Dupuytren's contracture of both hands 11/15/2013  . Hallux valgus of right foot 11/15/2013  . LGSIL (low grade squamous intraepithelial dysplasia) 11/23/2013  . Essential hypertension 02/11/2014  . Allergy 02/11/2014  . Screening for breast cancer 02/11/2014  . Midline low back pain without sciatica 08/12/2014  . Heberden nodes 08/12/2014  . Contracture of joint, hand 08/12/2014  . Preop testing 09/19/2014  . Pap smear for cervical cancer screening 09/19/2014   Resolved Ambulatory Problems    Diagnosis Date Noted  . No Resolved Ambulatory Problems   Past Medical History  Diagnosis Date  . GERD (gastroesophageal reflux disease)   . Perforated ulcer   . Chronic abdominal pain   . Constipation   . IBS (irritable bowel syndrome)   . Cervical dysplasia      Medication List       This list is accurate as of: 09/19/14  4:06 PM.  Always use your most recent med list.               acetaminophen-codeine 300-30 MG per tablet  Commonly known as:  TYLENOL #3  Take 1 tablet by mouth  every 4 (four) hours as needed.     amLODipine 10 MG tablet  Commonly known as:  NORVASC  TAKE 1 TABLET BY MOUTH ONCE DAILY     cyclobenzaprine 10 MG tablet  Commonly known as:  FLEXERIL  TAKE 1 TABLET BY MOUTH TWICE DAILY AS NEEDED FOR MUSCLE SPASMS     DULoxetine HCl 40 MG Cpep  Commonly known as:  CYMBALTA  Take 40 mg by mouth daily.     fluticasone 50 MCG/ACT nasal spray  Commonly known as:  FLONASE  INSTILL 2 SPRAYS INTO EACH NOSTRIL ONCE A DAY     gabapentin 300 MG capsule  Commonly known as:  NEURONTIN  Take 1 capsule (300 mg total) by mouth 3 (three) times daily.     guaiFENesin-codeine 100-10 MG/5ML syrup  Take 10 mLs by mouth 3 (three) times daily as needed for cough.     omeprazole 40 MG capsule  Commonly known as:  PRILOSEC  Take 1 capsule (40 mg total) by mouth daily.     ranitidine 75 MG tablet  Commonly known as:  ZANTAC  Take 75 mg by mouth.     sodium chloride 0.65 % nasal spray  Commonly known as:  OCEAN  Place 1 spray into the nose as needed for congestion.        Review of Systems  Constitutional: Negative for fever, chills, activity change, fatigue and unexpected weight change.  Eyes: Negative for visual disturbance.  Cardiovascular: Negative for chest pain, palpitations and leg swelling.  Gastrointestinal: Negative for nausea, vomiting, abdominal pain, diarrhea, constipation, blood in stool and abdominal distention.  Genitourinary: Negative for dysuria, frequency, hematuria, flank pain, vaginal bleeding, vaginal discharge, difficulty urinating, vaginal pain, menstrual problem and pelvic pain.  Neurological: Negative for dizziness, light-headedness, numbness and headaches.   Objective: Filed Vitals:   09/19/14 1433  BP: 120/83  Pulse: 76  Temp: 98 F (36.7 C)     Physical Exam  Constitutional:  Well-appearing 43 yo female with pink hair, alert, oriented, and in no acute distress.   HENT:  Head: Normocephalic and atraumatic.   Mouth/Throat: Oropharynx is clear and moist. No oropharyngeal exudate.  Eyes: Conjunctivae and EOM are normal. Pupils are equal, round, and reactive to light.  Neck: Normal range of motion. Neck supple. No JVD present. No thyromegaly present.  Cardiovascular: Normal rate, regular rhythm, normal heart sounds and intact distal pulses.  Exam reveals no gallop and no friction rub.   No murmur heard. Pulmonary/Chest: Effort normal and breath sounds normal. She exhibits no tenderness.  Abdominal: Soft. Bowel sounds are normal. She exhibits no distension. There is no tenderness.  Genitourinary:  Normal external female genitalia. Posterior cervix friable with contact bleeding. No unusual odor or discharge. No cervical motion tenderness.  Musculoskeletal: Normal range of motion. She exhibits no edema or tenderness.  Lymphadenopathy:    She has no cervical adenopathy.   Assessment & Plan:   Isabel Evans is a 44 yo female with history of hypertension, fibromyalgia, osteoarthritis, and LGSIL here today for a pap smear and preop paperwork for bunyon surgery scheduled for 09/23/14. Paperwork was filled out for preop evaluation. Scheduled patient for follow up visit in 6 mo.   Isabel Evans was seen today for physical with pap.  Diagnoses and all orders for this visit:  Preop testing  Pap smear for cervical cancer screening Orders: -     Cytology - PAP -     Cervicovaginal ancillary only   Evaluation and management procedures were performed by me with Medical Student in attendance, note written by medical student under my supervision and collaboration. I have reviewed the note and I agree with the management and plan.   Angelica Chessman, MD, [place], [place], [place], [place] and [place], [place]   09/19/2014, 7:10 PM

## 2014-09-19 NOTE — Progress Notes (Signed)
Patient having bunyon surgery soon and has form that needs filling out Patient hear for physical with pap No problems to report except for foot pain

## 2014-09-19 NOTE — Patient Instructions (Signed)
Fibromyalgia Fibromyalgia is a disorder that is often misunderstood. It is associated with muscular pains and tenderness that comes and goes. It is often associated with fatigue and sleep disturbances. Though it tends to be long-lasting, fibromyalgia is not life-threatening. CAUSES  The exact cause of fibromyalgia is unknown. People with certain gene types are predisposed to developing fibromyalgia and other conditions. Certain factors can play a role as triggers, such as:  Spine disorders.  Arthritis.  Severe injury (trauma) and other physical stressors.  Emotional stressors. SYMPTOMS   The main symptom is pain and stiffness in the muscles and joints, which can vary over time.  Sleep and fatigue problems. Other related symptoms may include:  Bowel and bladder problems.  Headaches.  Visual problems.  Problems with odors and noises.  Depression or mood changes.  Painful periods (dysmenorrhea).  Dryness of the skin or eyes. DIAGNOSIS  There are no specific tests for diagnosing fibromyalgia. Patients can be diagnosed accurately from the specific symptoms they have. The diagnosis is made by determining that nothing else is causing the problems. TREATMENT  There is no cure. Management includes medicines and an active, healthy lifestyle. The goal is to enhance physical fitness, decrease pain, and improve sleep. HOME CARE INSTRUCTIONS   Only take over-the-counter or prescription medicines as directed by your caregiver. Sleeping pills, tranquilizers, and pain medicines may make your problems worse.  Low-impact aerobic exercise is very important and advised for treatment. At first, it may seem to make pain worse. Gradually increasing your tolerance will overcome this feeling.  Learning relaxation techniques and how to control stress will help you. Biofeedback, visual imagery, hypnosis, muscle relaxation, yoga, and meditation are all options.  Anti-inflammatory medicines and  physical therapy may provide short-term help.  Acupuncture or massage treatments may help.  Take muscle relaxant medicines as suggested by your caregiver.  Avoid stressful situations.  Plan a healthy lifestyle. This includes your diet, sleep, rest, exercise, and friends.  Find and practice a hobby you enjoy.  Join a fibromyalgia support group for interaction, ideas, and sharing advice. This may be helpful. SEEK MEDICAL CARE IF:  You are not having good results or improvement from your treatment. FOR MORE INFORMATION  National Fibromyalgia Association: www.fmaware.org Arthritis Foundation: www.arthritis.org Document Released: 04/05/2005 Document Revised: 06/28/2011 Document Reviewed: 07/16/2009 [employer] Patient Information 2015 [employer]. This information is not intended to replace advice given to you by your health care provider. Make sure you discuss any questions you have with your health care provider.  

## 2014-09-20 LAB — CERVICOVAGINAL ANCILLARY ONLY
CHLAMYDIA, DNA PROBE: NEGATIVE
Neisseria Gonorrhea: NEGATIVE

## 2014-09-23 ENCOUNTER — Telehealth: Payer: Self-pay | Admitting: *Deleted

## 2014-09-23 DIAGNOSIS — B3731 Acute candidiasis of vulva and vagina: Secondary | ICD-10-CM

## 2014-09-23 DIAGNOSIS — B373 Candidiasis of vulva and vagina: Secondary | ICD-10-CM

## 2014-09-23 LAB — CERVICOVAGINAL ANCILLARY ONLY: Wet Prep (BD Affirm): POSITIVE — AB

## 2014-09-23 LAB — CYTOLOGY - PAP

## 2014-09-23 MED ORDER — FLUCONAZOLE 200 MG PO TABS: 200.0000 mg | ORAL_TABLET | Freq: Once | ORAL | Status: DC

## 2014-09-23 NOTE — Telephone Encounter (Signed)
"  I thought I was scheduled for surgery today.  I've been waiting to hear something and no one has called."   Patient came by the office at 1:40pm inquiring.  I informed her that I had left a message for her to call me on 09/06/2014 to reschedule surgery.  "I told the nurse that set up the date [place].  They gave him 6 months to live.  I've dropped my phone several times so there's a lot of messages that I have missed.  I'd like to do it as soon as possible because I've taken off work."  I'll see what I can do for you.  Dr. Jacqualyn Posey said we could try and do it on Wednesday or Friday of next week at lunch time if it's available at the surgical center.  "Okay, that's fine just let me know.  I already had my physical done.  They've faxed it to the surgical center already.  Should I take a copy of it with me when I go for the surgery?"  Yes, please do to be safe.  I'll call and let you know what's available.  I called and informed patient that we have her scheduled for 10/04/2014 at 12N.  The surgical center will call you a day before and let you know exactly what time to be there.  "Okay, will it be at the hospital?"  No, it will be at [facility].  "Okay, thank you."

## 2014-09-23 NOTE — Telephone Encounter (Signed)
Relayed results to patient and she will pick up prescription today.

## 2014-09-23 NOTE — Telephone Encounter (Signed)
-----   Message from Tresa Garter, MD sent at 09/23/2014 12:09 PM EDT ----- Please inform patient that her Pap smear cytology is negative for malignancy. However she is positive for yeast infection. We will treat with medication. Please call in Diflucan 200 mg tablet, 1 tab by mouth once, repeat in one week.

## 2014-10-03 ENCOUNTER — Encounter (HOSPITAL_BASED_OUTPATIENT_CLINIC_OR_DEPARTMENT_OTHER): Payer: Self-pay | Admitting: *Deleted

## 2014-10-04 ENCOUNTER — Ambulatory Visit (HOSPITAL_BASED_OUTPATIENT_CLINIC_OR_DEPARTMENT_OTHER)
Admission: RE | Admit: 2014-10-04 | Discharge: 2014-10-04 | Disposition: A | Discharge status: Home / Self Care | Payer: Self-pay | Source: Ambulatory Visit | Attending: Podiatry | Admitting: Podiatry

## 2014-10-04 ENCOUNTER — Encounter (HOSPITAL_BASED_OUTPATIENT_CLINIC_OR_DEPARTMENT_OTHER)
Admission: RE | Disposition: A | Discharge status: Home / Self Care | Payer: Self-pay | Source: Ambulatory Visit | Attending: Podiatry

## 2014-10-04 ENCOUNTER — Ambulatory Visit (HOSPITAL_BASED_OUTPATIENT_CLINIC_OR_DEPARTMENT_OTHER): Payer: Self-pay | Admitting: Anesthesiology

## 2014-10-04 ENCOUNTER — Ambulatory Visit (HOSPITAL_BASED_OUTPATIENT_CLINIC_OR_DEPARTMENT_OTHER): Payer: No Typology Code available for payment source | Admitting: Anesthesiology

## 2014-10-04 ENCOUNTER — Encounter: Payer: Self-pay | Admitting: Podiatry

## 2014-10-04 ENCOUNTER — Encounter (HOSPITAL_BASED_OUTPATIENT_CLINIC_OR_DEPARTMENT_OTHER): Payer: Self-pay | Admitting: Anesthesiology

## 2014-10-04 DIAGNOSIS — M2040 Other hammer toe(s) (acquired), unspecified foot: Secondary | ICD-10-CM

## 2014-10-04 DIAGNOSIS — M2011 Hallux valgus (acquired), right foot: Secondary | ICD-10-CM | POA: Insufficient documentation

## 2014-10-04 DIAGNOSIS — M2041 Other hammer toe(s) (acquired), right foot: Secondary | ICD-10-CM | POA: Insufficient documentation

## 2014-10-04 DIAGNOSIS — I10 Essential (primary) hypertension: Secondary | ICD-10-CM | POA: Insufficient documentation

## 2014-10-04 DIAGNOSIS — Z87891 Personal history of nicotine dependence: Secondary | ICD-10-CM | POA: Insufficient documentation

## 2014-10-04 DIAGNOSIS — M2010 Hallux valgus (acquired), unspecified foot: Secondary | ICD-10-CM

## 2014-10-04 HISTORY — DX: Hallux valgus (acquired), unspecified foot: M20.10

## 2014-10-04 HISTORY — DX: Bunion of unspecified foot: M21.619

## 2014-10-04 HISTORY — PX: BUNIONECTOMY: SHX129

## 2014-10-04 HISTORY — DX: Unspecified osteoarthritis, unspecified site: M19.90

## 2014-10-04 HISTORY — DX: Deviated nasal septum: J34.2

## 2014-10-04 HISTORY — PX: HAMMER TOE SURGERY: SHX385

## 2014-10-04 HISTORY — DX: Other hammer toe(s) (acquired), unspecified foot: M20.40

## 2014-10-04 HISTORY — DX: Headache: R51

## 2014-10-04 HISTORY — DX: Headache, unspecified: R51.9

## 2014-10-04 HISTORY — DX: Enthesopathy, unspecified: M77.9

## 2014-10-04 LAB — POCT I-STAT, CHEM 8
BUN: 5 mg/dL — AB (ref 6–20)
CHLORIDE: 107 mmol/L (ref 101–111)
CREATININE: 0.6 mg/dL (ref 0.44–1.00)
Calcium, Ion: 1.12 mmol/L (ref 1.12–1.23)
Glucose, Bld: 104 mg/dL — ABNORMAL HIGH (ref 65–99)
HCT: 37 % (ref 36.0–46.0)
Hemoglobin: 12.6 g/dL (ref 12.0–15.0)
Potassium: 3.8 mmol/L (ref 3.5–5.1)
SODIUM: 141 mmol/L (ref 135–145)
TCO2: 20 mmol/L (ref 0–100)

## 2014-10-04 SURGERY — BUNIONECTOMY
Anesthesia: Monitor Anesthesia Care | Site: Toe | Laterality: Right

## 2014-10-04 MED ORDER — OXYCODONE HCL 5 MG/5ML PO SOLN: 5.0000 mg | Freq: Once | ORAL | Status: DC | PRN

## 2014-10-04 MED ORDER — HYDROMORPHONE HCL 1 MG/ML IJ SOLN
INTRAMUSCULAR | Status: AC
  Filled 2014-10-04: qty 1

## 2014-10-04 MED ORDER — DEXAMETHASONE SODIUM PHOSPHATE 10 MG/ML IJ SOLN
INTRAMUSCULAR | Status: DC | PRN
  Administered 2014-10-04: 10 mg via INTRAVENOUS

## 2014-10-04 MED ORDER — CEFAZOLIN SODIUM-DEXTROSE 2-3 GM-% IV SOLR
INTRAVENOUS | Status: AC
  Filled 2014-10-04: qty 50

## 2014-10-04 MED ORDER — FENTANYL CITRATE (PF) 100 MCG/2ML IJ SOLN
INTRAMUSCULAR | Status: AC
  Filled 2014-10-04: qty 6

## 2014-10-04 MED ORDER — MIDAZOLAM HCL 2 MG/2ML IJ SOLN
INTRAMUSCULAR | Status: AC
  Filled 2014-10-04: qty 2

## 2014-10-04 MED ORDER — ONDANSETRON HCL 4 MG/2ML IJ SOLN
INTRAMUSCULAR | Status: DC | PRN
  Administered 2014-10-04: 4 mg via INTRAVENOUS

## 2014-10-04 MED ORDER — BUPIVACAINE HCL (PF) 0.5 % IJ SOLN
INTRAMUSCULAR | Status: AC
  Filled 2014-10-04: qty 30

## 2014-10-04 MED ORDER — HYDROMORPHONE HCL 1 MG/ML IJ SOLN
0.2500 mg | INTRAMUSCULAR | Status: DC | PRN
  Administered 2014-10-04 (×3): 0.5 mg via INTRAVENOUS

## 2014-10-04 MED ORDER — SCOPOLAMINE 1 MG/3DAYS TD PT72: 1.0000 | MEDICATED_PATCH | Freq: Once | TRANSDERMAL | Status: DC | PRN

## 2014-10-04 MED ORDER — MIDAZOLAM HCL 2 MG/2ML IJ SOLN
1.0000 mg | INTRAMUSCULAR | Status: DC | PRN
  Administered 2014-10-04: 2 mg via INTRAVENOUS

## 2014-10-04 MED ORDER — LIDOCAINE HCL (CARDIAC) 20 MG/ML IV SOLN
INTRAVENOUS | Status: DC | PRN
  Administered 2014-10-04: 50 mg via INTRAVENOUS

## 2014-10-04 MED ORDER — PROMETHAZINE HCL 12.5 MG PO TABS: 12.5000 mg | ORAL_TABLET | Freq: Four times a day (QID) | ORAL | Status: DC | PRN

## 2014-10-04 MED ORDER — GLYCOPYRROLATE 0.2 MG/ML IJ SOLN: 0.2000 mg | Freq: Once | INTRAMUSCULAR | Status: DC | PRN

## 2014-10-04 MED ORDER — KETOROLAC TROMETHAMINE 30 MG/ML IJ SOLN
INTRAMUSCULAR | Status: DC | PRN
  Administered 2014-10-04: 30 mg via INTRAVENOUS

## 2014-10-04 MED ORDER — CEFAZOLIN SODIUM-DEXTROSE 2-3 GM-% IV SOLR
2.0000 g | INTRAVENOUS | Status: AC
  Administered 2014-10-04: 2 g via INTRAVENOUS

## 2014-10-04 MED ORDER — LIDOCAINE HCL 2 % IJ SOLN
INTRAMUSCULAR | Status: DC | PRN
  Administered 2014-10-04: 10 mL

## 2014-10-04 MED ORDER — OXYCODONE-ACETAMINOPHEN 5-325 MG PO TABS: 1.0000 | ORAL_TABLET | Freq: Four times a day (QID) | ORAL | Status: DC | PRN

## 2014-10-04 MED ORDER — PROPOFOL INFUSION 10 MG/ML OPTIME
INTRAVENOUS | Status: DC | PRN
  Administered 2014-10-04: 200 ug/kg/min via INTRAVENOUS

## 2014-10-04 MED ORDER — LACTATED RINGERS IV SOLN
INTRAVENOUS | Status: DC
  Administered 2014-10-04 (×2): via INTRAVENOUS

## 2014-10-04 MED ORDER — LIDOCAINE HCL 2 % IJ SOLN
INTRAMUSCULAR | Status: AC
  Filled 2014-10-04: qty 20

## 2014-10-04 MED ORDER — FENTANYL CITRATE (PF) 100 MCG/2ML IJ SOLN
50.0000 ug | INTRAMUSCULAR | Status: AC | PRN
  Administered 2014-10-04: 100 ug via INTRAVENOUS
  Administered 2014-10-04 (×3): 25 ug via INTRAVENOUS

## 2014-10-04 MED ORDER — BUPIVACAINE HCL (PF) 0.5 % IJ SOLN
INTRAMUSCULAR | Status: DC | PRN
  Administered 2014-10-04: 10 mL

## 2014-10-04 MED ORDER — CEPHALEXIN 500 MG PO CAPS: 500.0000 mg | ORAL_CAPSULE | Freq: Three times a day (TID) | ORAL | Status: DC

## 2014-10-04 MED ORDER — OXYCODONE HCL 5 MG PO TABS: 5.0000 mg | ORAL_TABLET | Freq: Once | ORAL | Status: DC | PRN

## 2014-10-04 MED ORDER — MEPERIDINE HCL 25 MG/ML IJ SOLN: 6.2500 mg | INTRAMUSCULAR | Status: DC | PRN

## 2014-10-04 SURGICAL SUPPLY — 59 items
BANDAGE ELASTIC 3 VELCRO ST LF (GAUZE/BANDAGES/DRESSINGS) ×4 IMPLANT
BLADE AVERAGE 25MMX9MM (BLADE) ×1
BLADE AVERAGE 25X9 (BLADE) ×3 IMPLANT
BLADE OSC/SAG .038X5.5 CUT EDG (BLADE) ×4 IMPLANT
BLADE SURG 15 STRL LF DISP TIS (BLADE) ×6 IMPLANT
BLADE SURG 15 STRL SS (BLADE) ×6
BNDG COHESIVE 3X5 TAN STRL LF (GAUZE/BANDAGES/DRESSINGS) ×4 IMPLANT
BNDG CONFORM 2 STRL LF (GAUZE/BANDAGES/DRESSINGS) ×4 IMPLANT
BNDG ESMARK 4X9 LF (GAUZE/BANDAGES/DRESSINGS) ×4 IMPLANT
BNDG GAUZE ELAST 4 BULKY (GAUZE/BANDAGES/DRESSINGS) ×4 IMPLANT
BOOT STEPPER DURA LG (SOFTGOODS) ×4 IMPLANT
CAP PIN PROTECTOR ORTHO WHT (CAP) ×4 IMPLANT
COVER BACK TABLE 60X90IN (DRAPES) ×4 IMPLANT
CUFF TOURNIQUET SINGLE 18IN (TOURNIQUET CUFF) ×4 IMPLANT
DRAPE EXTREMITY T 121X128X90 (DRAPE) ×4 IMPLANT
DRAPE OEC MINIVIEW 54X84 (DRAPES) ×12 IMPLANT
DRAPE U 20/CS (DRAPES) ×4 IMPLANT
DRSG EMULSION OIL 3X3 NADH (GAUZE/BANDAGES/DRESSINGS) ×4 IMPLANT
DURAPREP 26ML APPLICATOR (WOUND CARE) ×4 IMPLANT
ELECT REM PT RETURN 9FT ADLT (ELECTROSURGICAL) ×4
ELECTRODE REM PT RTRN 9FT ADLT (ELECTROSURGICAL) ×2 IMPLANT
GAUZE SPONGE 4X4 12PLY STRL (GAUZE/BANDAGES/DRESSINGS) ×4 IMPLANT
GAUZE SPONGE 4X4 16PLY XRAY LF (GAUZE/BANDAGES/DRESSINGS) IMPLANT
GLOVE BIO SURGEON STRL SZ7.5 (GLOVE) ×8 IMPLANT
GLOVE BIOGEL PI IND STRL 7.0 (GLOVE) ×2 IMPLANT
GLOVE BIOGEL PI INDICATOR 7.0 (GLOVE) ×2
GLOVE ECLIPSE 6.5 STRL STRAW (GLOVE) ×4 IMPLANT
GLOVE EXAM NITRILE PF MED BLUE (GLOVE) ×4 IMPLANT
GOWN STRL REUS W/ TWL LRG LVL3 (GOWN DISPOSABLE) ×2 IMPLANT
GOWN STRL REUS W/ TWL XL LVL3 (GOWN DISPOSABLE) ×2 IMPLANT
GOWN STRL REUS W/TWL LRG LVL3 (GOWN DISPOSABLE) ×2
GOWN STRL REUS W/TWL XL LVL3 (GOWN DISPOSABLE) ×2
GUIDEWIRE ASNIS 2.0 100 NS (WIRE) ×4 IMPLANT
K-WIRE .045X4 (WIRE) ×4 IMPLANT
K-WIRE 1.2X100 (WIRE) ×4
KWIRE 1.2X100 (WIRE) ×2 IMPLANT
NDL SAFETY ECLIPSE 18X1.5 (NEEDLE) IMPLANT
NEEDLE HYPO 18GX1.5 SHARP (NEEDLE)
NEEDLE HYPO 25X1 1.5 SAFETY (NEEDLE) ×4 IMPLANT
NS IRRIG 1000ML POUR BTL (IV SOLUTION) IMPLANT
PACK BASIN DAY SURGERY FS (CUSTOM PROCEDURE TRAY) ×4 IMPLANT
PADDING CAST ABS 4INX4YD NS (CAST SUPPLIES) ×2
PADDING CAST ABS COTTON 4X4 ST (CAST SUPPLIES) ×2 IMPLANT
PENCIL BUTTON HOLSTER BLD 10FT (ELECTRODE) ×4 IMPLANT
SCREW 3.0X22MM (Screw) ×4 IMPLANT
SPONGE GAUZE 2X2 8PLY STER LF (GAUZE/BANDAGES/DRESSINGS) ×1
SPONGE GAUZE 2X2 8PLY STRL LF (GAUZE/BANDAGES/DRESSINGS) ×3 IMPLANT
STOCKINETTE 6  STRL (DRAPES) ×2
STOCKINETTE 6 STRL (DRAPES) ×2 IMPLANT
STRIP SUTURE WOUND CLOSURE 1/2 (SUTURE) IMPLANT
SUT ETHILON 5 0 PS 2 18 (SUTURE) ×8 IMPLANT
SUT MERSILENE 2.0 SH NDLE (SUTURE) ×4 IMPLANT
SUT MNCRL AB 3-0 PS2 18 (SUTURE) ×4 IMPLANT
SUT MNCRL AB 4-0 PS2 18 (SUTURE) ×4 IMPLANT
SUT MON AB 5-0 PS2 18 (SUTURE) ×4 IMPLANT
SYR BULB 3OZ (MISCELLANEOUS) ×4 IMPLANT
SYRINGE 10CC LL (SYRINGE) ×8 IMPLANT
UNDERPAD 30X30 (UNDERPADS AND DIAPERS) ×4 IMPLANT
cannulated screw ×4 IMPLANT

## 2014-10-04 NOTE — H&P (Signed)
[place] for anesthesia with no changes to History or CV exam from  09/19/14.

## 2014-10-04 NOTE — Anesthesia Preprocedure Evaluation (Signed)
Anesthesia Evaluation  Patient identified by MRN, date of birth, ID band Patient awake    Reviewed: Allergy & Precautions, NPO status , Patient's Chart, lab work & pertinent test results  Airway Mallampati: I  TM Distance: >3 FB Neck ROM: Full    Dental  (+) Teeth Intact, Dental Advisory Given   Pulmonary former smoker,  breath sounds clear to auscultation        Cardiovascular hypertension, Pt. on medications Rhythm:Regular Rate:Normal     Neuro/Psych    GI/Hepatic GERD-  Medicated and Controlled,  Endo/Other    Renal/GU      Musculoskeletal   Abdominal   Peds  Hematology   Anesthesia Other Findings   Reproductive/Obstetrics                             Anesthesia Physical Anesthesia Plan  ASA: II  Anesthesia Plan: MAC   Post-op Pain Management:    Induction: Intravenous  Airway Management Planned:   Additional Equipment:   Intra-op Plan:   Post-operative Plan:   Informed Consent: I have reviewed the patients History and Physical, chart, labs and discussed the procedure including the risks, benefits and alternatives for the proposed anesthesia with the patient or authorized representative who has indicated his/her understanding and acceptance.   Dental advisory given  Plan Discussed with: CRNA, Anesthesiologist and Surgeon  Anesthesia Plan Comments:         Anesthesia Quick Evaluation

## 2014-10-04 NOTE — Brief Op Note (Signed)
10/04/2014  2:00 PM  PATIENT:  Isabel Evans  44 y.o. female  PRE-OPERATIVE DIAGNOSIS:  HALLUX ABDUCTO VALGUS TAILOR BUNION HAMMERTOE  POST-OPERATIVE DIAGNOSIS:  HALLUX ABDUCTO VALGUS TAILOR BUNION HAMMERTOE  PROCEDURE:  Procedure(s):  AUSTIN BUNIONECTOMY, TAILOR'S BUNIONECTOMY (Right) HAMMER TOE REPAIR 4TH  AND 5TH  RIGHT FOOT  fourth toe fixation with k wire (Right)  SURGEON:  Surgeon(s) and Role:    * Trula Slade, DPM - Primary  PHYSICIAN ASSISTANT:   ASSISTANTS: none   ANESTHESIA:   MAC  EBL:     BLOOD ADMINISTERED:none  DRAINS: none   LOCAL MEDICATIONS USED:  OTHER 20 cc 1:1 mix 2% Lidocaine plain and 0.5% Marcaine plain  SPECIMEN:  No Specimen  DISPOSITION OF SPECIMEN:  N/A  COUNTS:  YES  TOURNIQUET:   Total Tourniquet Time Documented: Calf (Right) - 81 minutes Total: Calf (Right) - 81 minutes   DICTATION: .Viviann Spare Dictation  PLAN OF CARE: Discharge to home after PACU  PATIENT DISPOSITION:  PACU - hemodynamically stable.   Delay start of Pharmacological VTE agent (>24hrs) due to surgical blood loss or risk of bleeding: not applicable

## 2014-10-04 NOTE — Progress Notes (Signed)
DOS 10/04/2014 Right austin bunionectomy cut and reposition 1st metatarsal bone of right foot to straighten bunion, tailors bunionectomy right foot, hammer toe repair right 4th, and 5th toes.

## 2014-10-04 NOTE — Anesthesia Postprocedure Evaluation (Signed)
  Anesthesia Post-op Note  Patient: Isabel Evans  Procedure(s) Performed: Procedure(s):  AUSTIN BUNIONECTOMY, TAILOR'S BUNIONECTOMY (Right) HAMMER TOE REPAIR 4TH  AND 5TH  RIGHT FOOT  fourth toe fixation with k wire (Right)  Patient Location: PACU  Anesthesia Type: MAC   Level of Consciousness: awake, alert  and oriented  Airway and Oxygen Therapy: Patient Spontanous Breathing  Post-op Pain: none  Post-op Assessment: Post-op Vital signs reviewed  Post-op Vital Signs: Reviewed  Last Vitals:  Filed Vitals:   10/04/14 1510  BP:   Pulse: 76  Temp:   Resp: 18    Complications: No apparent anesthesia complications

## 2014-10-04 NOTE — Discharge Instructions (Signed)
After Surgery Instructions   1) If you are recuperating from surgery anywhere other than home, please be sure to leave [place] the number where you can be reached.  2) Go directly home and rest.  3) Keep the operated foot(feet) elevated six inches above the hip when sitting or lying down. This will help control swelling and pain.  4) Support the elevated foot and leg with pillows. DO NOT PLACE PILLOWS UNDER THE KNEE.  5) DO NOT REMOVE or get your bandages WET, unless you were given different instructions by your doctor to do so. This increases the risk of infection.  6) Wear your surgical boot at all times when you are up on your feet.  7) A limited amount of pain and swelling may occur. The skin may take on a bruised appearance. DO NOT BE ALARMED, THIS IS NORMAL.  8) For slight pain and swelling, apply an ice pack directly over the bandages for 15 minutes only out of each hour of the day. Continue until seen in the office for your first post op visit. DON NOT     APPLY ANY FORM OF HEAT TO THE AREA.  9) Have prescriptions filled immediately and take as directed.  10) Drink lots of liquids, water and juice to stay hydrated.  11) CALL IMMEDIATELY IF:  *Bleeding continues until the following day of surgery  *Pain increases and/or does not respond to medication  *Bandages or cast appears to tight  *If your bandage gets wet  *Trip, fall or stump your surgical foot  *If your temperature goes above 101  *If you have ANY questions at all  12) You may be weight bearing to heel on your right foot.   YOU NOW CONTROL THE EFFORT OF YOUR RECOVERY. ADHERING TO THESE INSTRUCTIONS WILL OFFER YOU THE MOST COMPLETE RESULTS     Post Anesthesia Home Care Instructions  Activity: Get plenty of rest for the remainder of the day. A responsible adult should stay with you for 24 hours following the procedure.  For the next 24 hours, DO NOT: -Drive a car -Paediatric nurse -Drink alcoholic  beverages -Take any medication unless instructed by your physician -Make any legal decisions or sign important papers.  Meals: Start with liquid foods such as gelatin or soup. Progress to regular foods as tolerated. Avoid greasy, spicy, heavy foods. If nausea and/or vomiting occur, drink only clear liquids until the nausea and/or vomiting subsides. Call your physician if vomiting continues.  Special Instructions/Symptoms: Your throat may feel dry or sore from the anesthesia or the breathing tube placed in your throat during surgery. If this causes discomfort, gargle with warm salt water. The discomfort should disappear within 24 hours.  If you had a scopolamine patch placed behind your ear for the management of post- operative nausea and/or vomiting:  1. The medication in the patch is effective for 72 hours, after which it should be removed.  Wrap patch in a tissue and discard in the trash. Wash hands thoroughly with soap and water. 2. You may remove the patch earlier than 72 hours if you experience unpleasant side effects which may include dry mouth, dizziness or visual disturbances. 3. Avoid touching the patch. Wash your hands with soap and water after contact with the patch.

## 2014-10-04 NOTE — Transfer of Care (Signed)
Immediate Anesthesia Transfer of Care Note  Patient: Isabel Evans  Procedure(s) Performed: Procedure(s):  AUSTIN BUNIONECTOMY, TAILOR'S BUNIONECTOMY (Right) HAMMER TOE REPAIR 4TH  AND 5TH  RIGHT FOOT  fourth toe fixation with k wire (Right)  Patient Location: PACU  Anesthesia Type:MAC  Level of Consciousness: awake, alert  and oriented  Airway & Oxygen Therapy: Patient Spontanous Breathing and Patient connected to face mask oxygen  Post-op Assessment: Report given to RN and Post -op Vital signs reviewed and stable  Post vital signs: Reviewed and stable  Last Vitals:  Filed Vitals:   10/04/14 1052  BP: 117/65  Pulse: 75  Temp: 36.6 C  Resp: 20    Complications: No apparent anesthesia complications

## 2014-10-07 ENCOUNTER — Encounter (HOSPITAL_BASED_OUTPATIENT_CLINIC_OR_DEPARTMENT_OTHER): Payer: Self-pay | Admitting: Podiatry

## 2014-10-07 ENCOUNTER — Other Ambulatory Visit: Payer: Self-pay | Admitting: Internal Medicine

## 2014-10-07 ENCOUNTER — Telehealth: Payer: Self-pay | Admitting: *Deleted

## 2014-10-07 NOTE — Telephone Encounter (Signed)
I called patient to see how she's doing.  "I'm doing okay."  Are you keeping your foot elevated?  "Yes, I'm keeping it elevated."  Is the pain medication working okay for you?  "Yes, it's okay.  I double up on it before I go to bed."  That's fine.  How was your experience at the surgical center?  "It was great."  Well wonderful, call us if you have any questions or concerns.

## 2014-10-07 NOTE — Op Note (Signed)
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 Right 4th and 5th digit hammertoe deformity Post-procedure Diagnosis: same Procedure: 1. Right Austin bunionectomy 2. Right tailors bunionectomy 3. Right 4th digit PIPJ arthrodesis with k-wire fixation 4. Right 5th digit PIPJ arthroplasty Pathology: none Anesthesia: MAC with local 30 cc 1:1 mix 0.5% marcaine plain and 2% lidocaine plain Hemostasis: PAT @ 217mm Hg EBL: minimal Materials: Stryker 3.0 and 2.59mm screws; 2-0 Mersilene, 3-0 Monocryl,4-0 monocryl, 5-0 monocryl , 5-0 nylon, 0.045 k-wire Injectables: none Complications: none  Indications for surgery: 44 year old female presents the office with complaints of painful bunion and tailor's bunion deformity as well as fourth and fifth toes. She states she also gets a painful callus on the ball of her fifth toe joint. She states that she has tried conservative treatment including, but not limited to shoe gear modifications, padding, offloading without any resolution of symptoms. At this time she is requesting surgical intervention to help decrease her pain and deformity. All alternatives, risks, complications were discussed with the patient detail. No promises or guarantees were given affecting the procedure and all questions were answered to the best of my ability.  Procedure in detail: The patient was both verbally and visually identified by myself, the nursing staff, and in the anesthesia staff in the preoperative area. The patient was then transferred to the operative room and placed on the operative table in supine position. Once under adequate plane of anesthesia well-padded pneumatic ankle tourniquet was placed on the right lower kidney. A total of 20 mL of a one-to-one mixture of 2% lidocaine plain and 0.5% Marcaine plain was infiltrated in a regional block fashion around the surgical site. The right lower extremity was scrubbed prepped  and draped in the normal sterile fashion.  Attention was then directed to the dorsomedial aspect of the right foot overlying the bunion deformity. The incision was made in line with the bunion deformity. The incision was made with a #15 with scalpel to the epidermis and dermis. The subcutaneous tissues and bluntly sharp dissection making sure to retract all vital neurovascular structures. All bleeders were cauterized as necessary. Attention was then directed to the first interspace. The deep transverse intermetatarsal ligament was then identified and transected. The adductor tendon was also identified and released from its attachment. The fibular sesamoidal ligament was also transected. At this time is found to be improvement of the first MTPJ range of motion. Attention was then redirected in-line with the skin incision was a capsular incision was then made making sure to dorsiflex the first MTPJ to avoid injuring the underlying cartilage. All soft tissue and periosteal structures were freed from the first metatarsal head of the operative site. Next a sagittal bone saw was utilized to resect the medial eminence of the first metatarsal head. Next an osteotomy was created for an [employer]. The osteotomy was created with a sagittal bone saw. Once it was completed the distal fragment was then distracted and shifted laterally into more corrected position. Once in the appropriate position a guidewire for Stryker 3.0 mm cannulated screw was inserted across the osteotomy site. Fluoroscopy his last confirm placement. Once the appropriate position a 3.0 mm cannulated screw was inserted over the guidewire following standard technique. The screw was inserted to appropriate tightness. The guidewire was removed. Fluoroscopy is utilized confirm placement. Next the overhanging medial bone was then excised with a sagittal bone saw. At this time as deemed appropriate to proceed with wound  closure. The incision was  copiously irrigated with sterile saline. The capsule was then reapproximated with 2-0 Mersilene and subcutaneous tissues of 3-0 Monocryl and skin was closed with 5-0 Monocryl and a running subcuticular pattern.  Attention was then directed over the dorsal lateral aspect of the right foot overlying the tailors bunion deformity. Incision was made with a #15 with scalpels of the epidermis the dermis over the dorsal lateral aspect of the foot overlying the fifth metatarsal distally. The subcutaneous tissues and bluntly sharp dissection making sure to retract all vital neurovascular structures. All bleeders were cauterized as necessary. Next the incision was then made down the bone overlying the fifth metatarsal distally. Soft tissue and periosteal structures were freed from the fifth metatarsal head. Next a sagittal bone saw was utilized to resect the lateral eminence of the fifth metatarsal head. Next an osteotomy is created for a modified Austin bunionectomy with a long plantar arm. Once the osteotomy was completed it was distracted and shifted medially into a corrected position. With the appropriate position a guidewire for Stryker 2.0 mm cannula and the screw was inserted across the osteotomy site. Fluoroscopy was elected confirm placement. Once in the appropriate position of 2.0 mm cannulated screw was inserted over the guidewire. The screw was tightened to appropriate compression. The guidewire was removed. Fluoroscopy is utilized confirm placement. The overhanging lateral bone was then excised with a sagittal bone saw. The incision was copiously irrigated with sterile saline and hemostasis was achieved. The incision was closed in layered fashion to the deep structures closed with 3-0 Monocryl as well as the subcutaneous chance tissues. The skin was then closed with 5-0 Monocryl in a running subcutaneous pattern.  Attention was then directed over the fourth digit as well as the fifth digit. Overlying the  fourth digit 2 similar elliptical incisions were planned over on the PIPJ linear fashion to ellipse a wedge of tissue. Over on the fifth digit 2 symmetrical incisions were planned from a distal medial to proximal lateral direction order to derotate the digit. Both incisions were made with a 15 with scalpel to the epidermis and dermis removing a wedge of tissue. The subcutaneous chance tissues were bluntly dissected to reflect all neurovascular structures. Next the extensor tendons were then transected at the level the PIPJ and the extensor tendons with reflect approximately. This was then over the both the digits. Next the medial and lateral collateral ligaments were also resected exposing the head of the proximal phalanx. On the fourth digit the base of the middle phalanges was also identified. A sagittal bone saw was then utilized to resect the head of the proximal phalanx of both the fourth and fifth digits. A sagittal bone saw was also utilized resect that base of the middle pharynx of the fourth digit. Next a 0.045 K wire was then drilled the proximal base of the fourth digit. The wire was then removed and was drilled from the middle phalanx out distally and then retrograded into the proximal phalanx. Fluoroscopy was used to confirm placement. Next the K wire was then bent and cut. Both incisions were covered the area with sterile saline and hemostasis was achieved. The extensor tendons of both repaired with 4-0 Monocryl. The skin was then closed with 5-0 nylon in a simple interrupted suture pattern.  All incisions were then dressed with Xeroform followed by dry sterile dressing. The pneumatic ankle tourniquet was inflated. Following deflation is found to be an immediate capillary refill time to all the digits.  The patient's mother prior to the procedure well any complications. She was then transferred to PACU vital signs stable and vascular status intact.  Post procedure course: Patient was discharged all  with pain medication, anti-meds, antibiotic. Follow up as scheduled. Call if questions or concerns.

## 2014-10-08 ENCOUNTER — Ambulatory Visit: Payer: No Typology Code available for payment source | Admitting: Physical Therapy

## 2014-10-11 ENCOUNTER — Ambulatory Visit: Payer: No Typology Code available for payment source

## 2014-10-11 ENCOUNTER — Other Ambulatory Visit: Payer: Self-pay | Admitting: *Deleted

## 2014-10-11 ENCOUNTER — Ambulatory Visit (INDEPENDENT_AMBULATORY_CARE_PROVIDER_SITE_OTHER): Payer: No Typology Code available for payment source | Admitting: Podiatry

## 2014-10-11 VITALS — BP 143/63 | HR 88 | Resp 16

## 2014-10-11 DIAGNOSIS — Z9889 Other specified postprocedural states: Secondary | ICD-10-CM

## 2014-10-11 DIAGNOSIS — M2011 Hallux valgus (acquired), right foot: Secondary | ICD-10-CM

## 2014-10-11 MED ORDER — OXYCODONE-ACETAMINOPHEN 10-325 MG PO TABS: 1.0000 | ORAL_TABLET | ORAL | Status: DC | PRN

## 2014-10-11 MED ORDER — CYCLOBENZAPRINE HCL 10 MG PO TABS: ORAL_TABLET | ORAL | Status: DC

## 2014-10-12 NOTE — Progress Notes (Signed)
Patient ID: Isabel Evans, female   DOB: 16-Jul-1970, 44 y.o.   MRN: [id number]  DOS: 10/04/14 s/p Right Liane Comber Bunionectomy, tailor's bunionectomy, PIPJ arthrodesis with K-wire 4th digit and PIPJ arthroplasty 5th digit.   Subjective: [name] presents to the office today 1 week s/p right foot surgery. She states that overall she is doing well and her pain is controlled with percocet. She is having to take 2 of the percocet at a time however. She's been taking the antibiotic as directed. She has not required the Phenergan. She has remained nonweightbearing with crutches in the [place] without any difficulty. She denies any systemic complaints such as fevers, chills, nausea, vomiting. Denies any calf pain, chest pain, shortness of breath. No other complaints at this time in no acute changes since last appointment.  Objective: AAO 3, NAD DP/PT pulses palpable, CRT less than 3 seconds Protective sensation intact with Simms once the monofilament Incisions on the dorsomedial, dorsolateral, fourth and fifth digits are all well coapted without any evidence of dehiscence and sutures are intact. K-wire intact to the 4th digit. There is no surrounding erythema, ascending cellulitis, fluctuance, crepitus, drainage/purulence, malodor. There is no signs of infection. There is mild edema along the surgical site. There is mild ecchymosis to the foot as well. There is mild tenderness along the surgical sites. There is no other areas of tenderness to bilateral lower extremities. No other open lesions or pre-ulcer lesions identified bilaterally. No other areas of edema, erythema, increased warmth to bilateral lower extremities. No pain with calf compression, swelling, warmth, erythema.  Assessment: 44 year old female 1 week status post right foot surgery, doing well  Plan: -X-rays were obtained and reviewed with the patient.  -Treatment options discussed including all alternatives, risks, and  complications -Antibiotic ointment placed over the incisions and the K wire site followed by dry sterile dressing. Keep his dressing clean, dry, intact.  -She can weight-bear as tolerated to the heel.  -Continue Percocet for pain. Refilled Percocet 10/325 -Ice and elevation  -Finish in a hallux  -Continue to monitor for any clinical signs or symptoms of infection,  DVT/PE and fitted to call the office immediately should any occur or go directly to the emergency room.  -Follow-up 1 week for suture removal or sooner if any problems arise. In the meantime, encouraged to call the office with any questions, concerns, change in symptoms.   Celesta Gentile, DPM

## 2014-10-16 ENCOUNTER — Telehealth: Payer: Self-pay | Admitting: *Deleted

## 2014-10-16 NOTE — Telephone Encounter (Signed)
You can do vicodin 5/325 Disp 30 1-2 tabs PO q4-6 prn pain. Thanks.

## 2014-10-16 NOTE — Telephone Encounter (Addendum)
Pt request pain medication that does not make her so nauseous.  Left message informing pt her rx was waiting at the front desk in the [place] office.  Left message reminding pt to pick up rx.

## 2014-10-17 ENCOUNTER — Ambulatory Visit: Payer: Self-pay | Admitting: Internal Medicine

## 2014-10-17 MED ORDER — HYDROCODONE-ACETAMINOPHEN 5-325 MG PO TABS: 1.0000 | ORAL_TABLET | Freq: Four times a day (QID) | ORAL | Status: DC | PRN

## 2014-10-18 DIAGNOSIS — M2011 Hallux valgus (acquired), right foot: Secondary | ICD-10-CM

## 2014-10-22 ENCOUNTER — Ambulatory Visit (INDEPENDENT_AMBULATORY_CARE_PROVIDER_SITE_OTHER): Payer: No Typology Code available for payment source | Admitting: Podiatry

## 2014-10-22 DIAGNOSIS — Z9889 Other specified postprocedural states: Secondary | ICD-10-CM

## 2014-10-23 NOTE — Progress Notes (Signed)
Subjective:     Patient ID: Isabel Evans, female   DOB: [birth year], 44 y.o.   MRN: [id number]  HPI patient states that I'm doing well with my surgery   Review of Systems     Objective:   Physical Exam Neurovascular status is intact and intact fourth toe and wound edges are well coapted with stitches in place    Assessment:     Doing well post forefoot reconstruction right with good alignment of the first metatarsal wound edges well coapted and pin in place fourth toe    Plan:     Stitches are removed wound edges remain coapted well and sterile dressing reapplied. Continued elevation immobilization and reappoint for reevaluation in 2 weeks or earlier if any issues should occur

## 2014-10-24 ENCOUNTER — Ambulatory Visit: Payer: No Typology Code available for payment source | Attending: Internal Medicine | Admitting: Internal Medicine

## 2014-10-24 ENCOUNTER — Encounter: Payer: Self-pay | Admitting: Internal Medicine

## 2014-10-24 VITALS — BP 121/82 | HR 71 | Temp 98.4°F | Resp 16 | Wt 187.0 lb

## 2014-10-24 DIAGNOSIS — I10 Essential (primary) hypertension: Secondary | ICD-10-CM

## 2014-10-24 DIAGNOSIS — F411 Generalized anxiety disorder: Secondary | ICD-10-CM

## 2014-10-24 DIAGNOSIS — K219 Gastro-esophageal reflux disease without esophagitis: Secondary | ICD-10-CM | POA: Insufficient documentation

## 2014-10-24 DIAGNOSIS — Z79899 Other long term (current) drug therapy: Secondary | ICD-10-CM | POA: Insufficient documentation

## 2014-10-24 DIAGNOSIS — K589 Irritable bowel syndrome without diarrhea: Secondary | ICD-10-CM

## 2014-10-24 DIAGNOSIS — F329 Major depressive disorder, single episode, unspecified: Secondary | ICD-10-CM | POA: Insufficient documentation

## 2014-10-24 DIAGNOSIS — M797 Fibromyalgia: Secondary | ICD-10-CM | POA: Insufficient documentation

## 2014-10-24 DIAGNOSIS — Z638 Other specified problems related to primary support group: Secondary | ICD-10-CM

## 2014-10-24 DIAGNOSIS — F439 Reaction to severe stress, unspecified: Secondary | ICD-10-CM

## 2014-10-24 MED ORDER — HYDROXYZINE HCL 25 MG PO TABS: 25.0000 mg | ORAL_TABLET | Freq: Three times a day (TID) | ORAL | Status: DC | PRN

## 2014-10-24 MED ORDER — DICYCLOMINE HCL 10 MG PO CAPS: 10.0000 mg | ORAL_CAPSULE | Freq: Three times a day (TID) | ORAL | Status: DC

## 2014-10-24 NOTE — Progress Notes (Signed)
Patient ID: Isabel Evans, female   DOB: 1971-03-14, 44 y.o.   MRN: [id number]   Isabel Evans, is a 44 y.o. female  GEZ:[id number]  [id number]:[id number]  DOB - August 09, 1970  Chief Complaint  Patient presents with  . Follow-up        Subjective:   Isabel Evans is a 44 y.o. female here today for a follow up visit. Patient here for follow up on her IBS, Patient states she was doing well for about a year and lately back to going 5 days or so without a bowel movement. Patient also complains of being depressed lately and going through domestic stress from multiple relatives being sick. Her dad has terminal cancer, she had recent foot surgery and most recent her mom was in the hospital for chronic medical condition, and she has to take care of them. She is tearful during this encounter. She is requesting medication for anxiety. She denies suicidal ideation or thoughts. She has no urinary symptoms. Patient has No headache, No chest pain, No abdominal pain - No Nausea, No new weakness tingling or numbness, No Cough - SOB.  Problem  Stress At Home  Irritable Bowel Syndrome  Anxiety State    ALLERGIES: No Known Allergies  PAST MEDICAL HISTORY: Past Medical History  Diagnosis Date  . GERD (gastroesophageal reflux disease)   . IBS (irritable bowel syndrome)     no current med.  . Fibromyalgia   . Sinus headache   . Deviated nasal septum     states is unable to lie flat, because her nose will become congested and she can stop breathing  . Osteoarthritis     hands  . Bone spur     cervical spine  . Hypertension     states is under control with med., has been on med. x 8 mos.  . 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 Hammertoe 09/2014    right 4th, 5th    MEDICATIONS AT HOME: Prior to Admission medications   Medication Sig Start Date End Date Taking? Authorizing Provider  amLODipine (NORVASC) 10 MG tablet TAKE 1 TABLET BY MOUTH ONCE DAILY 02/11/14   Tresa Garter,  MD  cephALEXin (KEFLEX) 500 MG capsule Take 1 capsule (500 mg total) by mouth 3 (three) times daily. 10/04/14   Trula Slade, DPM  cyclobenzaprine (FLEXERIL) 10 MG tablet TAKE 1 TABLET BY MOUTH TWICE DAILY AS NEEDED FOR MUSCLE SPASMS 10/11/14   Tresa Garter, MD  dicyclomine (BENTYL) 10 MG capsule Take 1 capsule (10 mg total) by mouth 4 (four) times daily -  before meals and at bedtime. 10/24/14   Tresa Garter, MD  DULoxetine HCl 40 MG CPEP Take 40 mg by mouth daily. 05/23/14   Tresa Garter, MD  fluconazole (DIFLUCAN) 200 MG tablet Take 1 tablet (200 mg total) by mouth once. Then repeat another dose in one week following the first dose. 09/23/14   Tresa Garter, MD  fluticasone (FLONASE) 50 MCG/ACT nasal spray INSTILL 2 SPRAYS INTO EACH NOSTRIL ONCE A DAY 11/05/13   Robbie Lis, MD  gabapentin (NEURONTIN) 300 MG capsule Take 1 capsule (300 mg total) by mouth 3 (three) times daily. 05/23/14   Tresa Garter, MD  HYDROcodone-acetaminophen (NORCO/VICODIN) 5-325 MG per tablet Take 1-2 tablets by mouth every 6 (six) hours as needed for moderate pain (every 4-6 hours prn foot pain.). 10/17/14   Trula Slade, DPM  hydrOXYzine (ATARAX/VISTARIL) 25 MG tablet  Take 1 tablet (25 mg total) by mouth 3 (three) times daily as needed. 10/24/14   Tresa Garter, MD  omeprazole (PRILOSEC) 40 MG capsule Take 1 capsule (40 mg total) by mouth daily. 09/17/14   Lorayne Marek, MD  oxyCODONE-acetaminophen (ROXICET) 5-325 MG per tablet Take 1-2 tablets by mouth every 6 (six) hours as needed for severe pain. 10/04/14   Trula Slade, DPM  promethazine (PHENERGAN) 12.5 MG tablet Take 1 tablet (12.5 mg total) by mouth every 6 (six) hours as needed for nausea or vomiting. 10/04/14   Trula Slade, DPM     Objective:   Filed Vitals:   10/24/14 0942  BP: 121/82  Pulse: 71  Temp: 98.4 F (36.9 C)  Resp: 16  Weight: 187 lb (84.823 kg)  SpO2: 100%    Exam General appearance : Awake,  alert, not in any distress. Speech Clear. Not toxic looking HEENT: Atraumatic and Normocephalic, pupils equally reactive to light and accomodation Neck: supple, no JVD. No cervical lymphadenopathy.  Chest:Good air entry bilaterally, no added sounds  CVS: S1 S2 regular, no murmurs.  Abdomen: Bowel sounds present, Non tender and not distended with no gaurding, rigidity or rebound. Extremities: B/L Lower Ext shows no edema, both legs are warm to touch Neurology: Awake alert, and oriented X 3, CN II-XII intact, Non focal Skin:No Rash  Data Review No results found for: HGBA1C   Assessment & Plan   1. Essential hypertension  - We have discussed target BP range and blood pressure goal - I have advised patient to check BP regularly and to call us back or report to clinic if the numbers are consistently higher than 140/90  - We discussed the importance of compliance with medical therapy and DASH diet recommended, consequences of uncontrolled hypertension discussed.  - continue current BP medications  2. Stress at home  - hydrOXYzine (ATARAX/VISTARIL) 25 MG tablet; Take 1 tablet (25 mg total) by mouth 3 (three) times daily as needed.  Dispense: 30 tablet; Refill: 3 - Patient has been referred to a licensed clinical social worker for counseling today  3. Irritable bowel syndrome  - dicyclomine (BENTYL) 10 MG capsule; Take 1 capsule (10 mg total) by mouth 4 (four) times daily -  before meals and at bedtime.  Dispense: 30 capsule; Refill: 3  4. Anxiety state  - hydrOXYzine (ATARAX/VISTARIL) 25 MG tablet; Take 1 tablet (25 mg total) by mouth 3 (three) times daily as needed.  Dispense: 30 tablet; Refill: 3  Patient have been counseled extensively about nutrition and exercise  Return in about 3 months (around 01/24/2015) for Follow up Pain and comorbidities, Follow up HTN.  The patient was given clear instructions to go to ER or return to medical center if symptoms don't improve, worsen or new  problems develop. The patient verbalized understanding. The patient was told to call to get lab results if they haven't heard anything in the next week.   This note has been created with Surveyor, quantity. Any transcriptional errors are unintentional.    Angelica Chessman, MD, [place], [place], [place], [place] and [facility] [place], [place]   10/24/2014, 10:49 AM

## 2014-10-24 NOTE — Progress Notes (Signed)
Patient here for follow up on her IBS Patient states she was doing well for about a year and lately Back to going 5 days or so with out a bowel movement Patient also complains of being depressed lately Patient states she has a lot going on in her life right now Her dad has terminal cancer, recent foot surgery and most recent her mom was In the hospital

## 2014-10-24 NOTE — Patient Instructions (Signed)
Arthralgia Your caregiver has diagnosed you as suffering from an arthralgia. Arthralgia means there is pain in a joint. This can come from many reasons including:  Bruising the joint which causes soreness (inflammation) in the joint.  Wear and tear on the joints which occur as we grow older (osteoarthritis).  Overusing the joint.  Various forms of arthritis.  Infections of the joint. Regardless of the cause of pain in your joint, most of these different pains respond to anti-inflammatory drugs and rest. The exception to this is when a joint is infected, and these cases are treated with antibiotics, if it is a bacterial infection. HOME CARE INSTRUCTIONS   Rest the injured area for as long as directed by your caregiver. Then slowly start using the joint as directed by your caregiver and as the pain allows. Crutches as directed may be useful if the ankles, knees or hips are involved. If the knee was splinted or casted, continue use and care as directed. If an stretchy or elastic wrapping bandage has been applied today, it should be removed and re-applied every 3 to 4 hours. It should not be applied tightly, but firmly enough to keep swelling down. Watch toes and feet for swelling, bluish discoloration, coldness, numbness or excessive pain. If any of these problems (symptoms) occur, remove the ace bandage and re-apply more loosely. If these symptoms persist, contact your caregiver or return to this location.  For the first 24 hours, keep the injured extremity elevated on pillows while lying down.  Apply ice for 15-20 minutes to the sore joint every couple hours while awake for the first half day. Then 03-04 times per day for the first 48 hours. Put the ice in a plastic bag and place a towel between the bag of ice and your skin.  Wear any splinting, casting, elastic bandage applications, or slings as instructed.  Only take over-the-counter or prescription medicines for pain, discomfort, or fever as  directed by your caregiver. Do not use aspirin immediately after the injury unless instructed by your physician. Aspirin can cause increased bleeding and bruising of the tissues.  If you were given crutches, continue to use them as instructed and do not resume weight bearing on the sore joint until instructed. Persistent pain and inability to use the sore joint as directed for more than 2 to 3 days are warning signs indicating that you should see a caregiver for a follow-up visit as soon as possible. Initially, a hairline fracture (break in bone) may not be evident on X-rays. Persistent pain and swelling indicate that further evaluation, non-weight bearing or use of the joint (use of crutches or slings as instructed), or further X-rays are indicated. X-rays may sometimes not show a small fracture until a week or 10 days later. Make a follow-up appointment with your own caregiver or one to whom we have referred you. A radiologist (specialist in reading X-rays) may read your X-rays. Make sure you know how you are to obtain your X-ray results. Do not assume everything is normal if you do not hear from [place]. SEEK MEDICAL CARE IF: Bruising, swelling, or pain increases. SEEK IMMEDIATE MEDICAL CARE IF:   Your fingers or toes are numb or blue.  The pain is not responding to medications and continues to stay the same or get worse.  The pain in your joint becomes severe.  You develop a fever over 102 F (38.9 C).  It becomes impossible to move or use the joint. MAKE SURE YOU:  Understand these instructions.  Will watch your condition.  Will get help right away if you are not doing well or get worse. Document Released: 04/05/2005 Document Revised: 06/28/2011 Document Reviewed: 11/22/2007 [facility] Patient Information 2015 [place], [place]. This information is not intended to replace advice given to you by your health care provider. Make sure you discuss any questions you have with your health care  provider. Irritable Bowel Syndrome Irritable bowel syndrome (IBS) is caused by a disturbance of normal bowel function and is a common digestive disorder. You may also hear this condition called spastic colon, mucous colitis, and irritable colon. There is no cure for IBS. However, symptoms often gradually improve or disappear with a good diet, stress management, and medicine. This condition usually appears in late adolescence or early adulthood. Women develop it twice as often as men. CAUSES  After food has been digested and absorbed in the small intestine, waste material is moved into the large intestine, or colon. In the colon, water and salts are absorbed from the undigested products coming from the small intestine. The remaining residue, or fecal material, is held for elimination. Under normal circumstances, gentle, rhythmic contractions of the bowel walls push the fecal material along the colon toward the rectum. In IBS, however, these contractions are irregular and poorly coordinated. The fecal material is either retained too long, resulting in constipation, or expelled too soon, producing diarrhea. SIGNS AND SYMPTOMS  The most common symptom of IBS is abdominal pain. It is often in the lower left side of the abdomen, but it may occur anywhere in the abdomen. The pain comes from spasms of the bowel muscles happening too much and from the buildup of gas and fecal material in the colon. This pain:  Can range from sharp abdominal cramps to a dull, continuous ache.  Often worsens soon after eating.  Is often relieved by having a bowel movement or passing gas. Abdominal pain is usually accompanied by constipation, but it may also produce diarrhea. The diarrhea often occurs right after a meal or upon waking up in the morning. The stools are often soft, watery, and flecked with mucus. Other symptoms of IBS include:  Bloating.  Loss of appetite.  Heartburn.  Backache.  Dull pain in the arms or  shoulders.  Nausea.  Burping.  Vomiting.  Gas. IBS may also cause symptoms that are unrelated to the digestive system, such as:  Fatigue.  Headaches.  Anxiety.  Shortness of breath.  Trouble concentrating.  Dizziness. These symptoms tend to come and go. DIAGNOSIS  The symptoms of IBS may seem like symptoms of other, more serious digestive disorders. Your health care provider may want to perform tests to exclude these disorders.  TREATMENT Many medicines are available to help correct bowel function or relieve bowel spasms and abdominal pain. Among the medicines available are:  Laxatives for severe constipation and to help restore normal bowel habits.  Specific antidiarrheal medicines to treat severe or lasting diarrhea.  Antispasmodic agents to relieve intestinal cramps. Your health care provider may also decide to treat you with a mild tranquilizer or sedative during unusually stressful periods in your life. Your health care provider may also prescribe antidepressant medicine. The use of this medicine has been shown to reduce pain and other symptoms of IBS. Remember that if any medicine is prescribed for you, you should take it exactly as directed. Make sure your health care provider knows how well it worked for you. HOME CARE INSTRUCTIONS   Take all medicines  as directed by your health care provider.  Avoid foods that are high in fat or oils, such as heavy cream, butter, frankfurters, sausage, and other fatty meats.  Avoid foods that make you go to the bathroom, such as fruit, fruit juice, and dairy products.  Cut out carbonated drinks, chewing gum, and "gassy" foods such as beans and cabbage. This may help relieve bloating and burping.  Eat foods with bran, and drink plenty of liquids with the bran foods. This helps relieve constipation.  Keep track of what foods seem to bring on your symptoms.  Avoid emotionally charged situations or circumstances that produce  anxiety.  Start or continue exercising.  Get plenty of rest and sleep. Document Released: 04/05/2005 Document Revised: 04/10/2013 Document Reviewed: 11/24/2007 [facility] Patient Information 2015 [place], [place]. This information is not intended to replace advice given to you by your health care provider. Make sure you discuss any questions you have with your health care provider.

## 2014-10-24 NOTE — Progress Notes (Signed)
ASSESSMENT: Pt currently experiencing symptoms of depression, increased by psychosocial circumstances. Pt needs to F/U as needed with PCP and [facility]. Patient would benefit from [facility] medication, as well as psychoeducation and supportive counseling regarding coping with symptoms of depression and life stressors. Stage of Change: contemplative  PLAN: 1. F/U with behavioral health consultant in as needed 2. Psychiatric Medications: Hydroxyzine 3. Behavioral recommendation(s):   -Go to [facility] walk-in clinic  -Consider reading educational materials regarding coping with depression SUBJECTIVE: Pt. referred by Dr Doreene Burke for symptoms of depression and stress at home Pt. here for referral regarding coping with symptoms of depression and stress at home.  Pt. reports the following symptoms/concerns: Pt states that she is having difficulty getting out of bed most days, cries daily, does not enjoy going anywhere anymore, and has no energy to keep up with her normal routine. She says she is overwhelmed most days, and avoids spending time with others.She says she has felt mildly depressed in the past, but has never felt it as strongly. 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   Duration of problem: At least two months Severity: moderate  OBJECTIVE: Orientation & Cognition: Oriented x3. Thought processes normal and appropriate to situation. Mood: teary. Affect: appropriate Appearance: appropriate Risk of harm to self or others: no risk of harm to self or others Substance use: alcohol Psychiatric medication use: Unchanged from prior contact. Assessments administered: PSQ9-21/ GAD7-18  Diagnosis: Stress at home/ Major depression, single episode, unspecified CPT Code: Z63.8/ F32.9 -------------------------------------------- Other(s) present in the room: none  Time spent with patient in exam room: 20 minutes

## 2014-10-28 ENCOUNTER — Ambulatory Visit: Payer: No Typology Code available for payment source | Attending: Orthopaedic Surgery

## 2014-10-28 DIAGNOSIS — R6889 Other general symptoms and signs: Secondary | ICD-10-CM | POA: Insufficient documentation

## 2014-10-28 DIAGNOSIS — G8929 Other chronic pain: Secondary | ICD-10-CM | POA: Insufficient documentation

## 2014-10-28 DIAGNOSIS — M436 Torticollis: Secondary | ICD-10-CM | POA: Insufficient documentation

## 2014-10-28 DIAGNOSIS — M542 Cervicalgia: Secondary | ICD-10-CM | POA: Insufficient documentation

## 2014-10-28 DIAGNOSIS — R293 Abnormal posture: Secondary | ICD-10-CM

## 2014-10-28 DIAGNOSIS — M545 Low back pain: Secondary | ICD-10-CM | POA: Insufficient documentation

## 2014-10-28 NOTE — Therapy (Addendum)
[facility] [address] [place], [place], [address] Phone: [phone]   Fax:  [phone]  Physical Therapy Evaluation  Patient Details  Name: Isabel Evans MRN: [id number] Date of Birth: 1970/05/29 Referring Provider:  Kathryne Hitch*  Encounter Date: 10/28/2014    Past Medical History  Diagnosis Date  . GERD (gastroesophageal reflux disease)   . IBS (irritable bowel syndrome)     no current med.  . Fibromyalgia   . Sinus headache   . Deviated nasal septum     states is unable to lie flat, because her nose will become congested and she can stop breathing  . Osteoarthritis     hands  . Bone spur     cervical spine  . Hypertension     states is under control with med., has been on med. x 8 mos.  . 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 Hammertoe 09/2014    right 4th, 5th    Past Surgical History  Procedure Laterality Date  . Leg surgery Left     as a child; near amputation of leg  . Tubal ligation    . Dupuytren contracture release Left 07/18/2013    small finger  . Dupuytren / palmar fasciotomy Right 07/18/2013    exc. of multiple nodules  . Colonoscopy with propofol  02/13/2013  . Bunionectomy Right 10/04/2014    Procedure:  Serafina Royals, Donzetta Sprung;  Surgeon: Vivi Barrack, DPM;  Location: [facility];  Service: Podiatry;  Laterality: Right;  . Hammer toe surgery Right 10/04/2014    Procedure: HAMMER TOE REPAIR 4TH  AND 5TH  RIGHT FOOT  fourth toe fixation with k wire;  Surgeon: Vivi Barrack, DPM;  Location: [facility];  Service: Podiatry;  Laterality: Right;    There were no vitals filed for this visit.  Visit Diagnosis:  Chronic lower back pain - Plan: PT plan of care cert/re-cert  Chronic neck pain - Plan: PT plan of care cert/re-cert  Abnormal posture - Plan: PT plan of care cert/re-cert  Decreased activity tolerance - Plan:  PT plan of care cert/re-cert  Stiffness of cervical spine - Plan: PT plan of care cert/re-cert      Subjective Assessment - 10/28/14 1510    Subjective Neck and back pain. She reports onset without injury. Sweeping and mopping causes pain. bending incr pain. No PT before   How long can you sit comfortably? 30 min   How long can you stand comfortably? 30 min   How long can you walk comfortably? 100 feet or less due to foot surger last month   Diagnostic tests xrays: spurs and degenerative changes in neck, thoracic and back negative   Patient Stated Goals Decrease pain   Currently in Pain? Yes   Pain Score 4    Pain Location Neck   Pain Orientation Posterior;Right;Left   Pain Descriptors / Indicators Aching;Throbbing;Burning   Pain Type Chronic pain   Pain Onset More than a month ago   Pain Frequency Constant   Aggravating Factors  Bending , house work   Pain Relieving Factors nothing really helps but after lying pain seems to ease after awhile   Multiple Pain Sites Yes   Pain Score 4   Pain Location Back   Pain Orientation Posterior;Right;Left   Pain Descriptors / Indicators Aching;Burning;Throbbing   Pain Type Chronic pain   Pain Onset More than a month ago  Pain Frequency Constant   Aggravating Factors  bending , housework   Effect of Pain on Daily Activities Decrease pain            [facility] PT Assessment - 10/28/14 1517    Assessment   Medical Diagnosis neck and back pain   Onset Date/Surgical Date --  as early as 2013   Next MD Visit 11/11/14   Prior Therapy No   Precautions   Precautions None   Restrictions   Weight Bearing Restrictions --  As tolerated in boot due to foot surgery   Balance Screen   Has the patient fallen in the past 6 months No   Prior Function   Level of Independence Independent with basic ADLs   Cognition   Overall Cognitive Status Within Functional Limits for tasks assessed   Observation/Other Assessments   Focus on Therapeutic Outcomes  (FOTO)  60%   Posture/Postural Control   Posture Comments WNL except RT hip higher due to CAM walker on RT foot and mild forward head    ROM / Strength   AROM / PROM / Strength AROM;PROM;Strength   AROM   AROM Assessment Site Cervical;Lumbar   Cervical Flexion 32   Cervical Extension 34   Cervical - Right Side Bend 28   Cervical - Left Side Bend 30   Cervical - Right Rotation 45   Cervical - Left Rotation 48   Lumbar Flexion She can tpouch her distal tibia   Lumbar Extension 20   Lumbar - Right Side Bend 25   Lumbar - Left Side Bend 30   Lumbar - Right Rotation 60   Lumbar - Left Rotation 55   PROM   Overall PROM Comments Cervical motion WNL passively. , Hip motion WNL bilaterally. with sonme LT hip pain (chronic from childhood)   Strength   Overall Strength Comments UE WNL bilaterally   Flexibility   Soft Tissue Assessment /Muscle Length yes   Hamstrings 60 degres bilaterally   Ambulation/Gait   Gait Comments Walks with decreased weight to RT due to CAM walker                   [facility] Adult PT Treatment/Exercise - 10/28/14 1517    Modalities   Modalities Electrical Stimulation   Electrical Stimulation   Electrical Stimulation Location bak nad neck   Electrical Stimulation Action IFC   Electrical Stimulation Parameters L10   Electrical Stimulation Goals Pain                PT Education - 10/28/14 1554    Education provided Yes   Education Details POC, posture and mechanics handout   Person(s) Educated Patient   Methods Explanation;Tactile cues;Verbal cues;Handout   Comprehension Returned demonstration;Verbalized understanding          PT Short Term Goals - 10/28/14 1557    PT SHORT TERM GOAL #1   Title She will be independent with inital HEP   Time 3   Period Weeks   Status New   PT SHORT TERM GOAL #2   Title She will demo understanding of good sitting posture   Time 3   Period Weeks   Status New   PT SHORT TERM GOAL #3   Title She  will report 20% decr neck pain.    Time 3   Period Weeks   Status New   PT SHORT TERM GOAL #4   Title She will report pain decreased 20% in back   Time  3   Period Weeks   Status New           PT Long Term Goals - 10/28/14 1558    PT LONG TERM GOAL #1   Title She will be independent with all HEP issued as of last visit   Time 6   Period Weeks   Status New   PT LONG TERM GOAL #2   Title She will report pain decreased 50% in neck with sitting and home tasks   Time 6   Period Weeks   Status New   PT LONG TERM GOAL #3   Title she will report back pain decr 50% in back with home tasks.    Time 6   Period Weeks   Status New   PT LONG TERM GOAL #4   Title She will report able to consistently sit for 45-60 min without incr pain with  support   Time 6   Period Weeks   Status New               Plan - 10/28/14 1606    Clinical Impression Statement Ms Mccrory reprots chronic ne ck and back pain limiting funtional activity and posture tolerance. She reports nothing really eases pain. She has some stifness in her neck and mild forward head.  She should improve withPT but continue with pain  in long run   Pt will benefit from skilled therapeutic intervention in order to improve on the following deficits Decreased activity tolerance;Postural dysfunction;Pain;Increased muscle spasms;Decreased range of motion   Rehab Potential Good   PT Frequency 2x / week   PT Duration 6 weeks   PT Treatment/Interventions Electrical Stimulation;Moist Heat;Therapeutic exercise;Patient/family education;Manual techniques;Taping;Dry needling;Passive range of motion   PT Next Visit Plan Posture and mechanics review, stim if helpful today, manual tretment and initate HEP   PT Home Exercise Plan Review psoture ed   Consulted and Agree with Plan of Care Patient         Problem List Patient Active Problem List   Diagnosis Date Noted  . Stress at home 10/24/2014  . Irritable bowel syndrome  10/24/2014  . Anxiety state 10/24/2014  . Preop testing 09/19/2014  . Pap smear for cervical cancer screening 09/19/2014  . Midline low back pain without sciatica 08/12/2014  . [place] nodes 08/12/2014  . Contracture of joint, hand 08/12/2014  . Essential hypertension 02/11/2014  . Allergy 02/11/2014  . Screening for breast cancer 02/11/2014  . LGSIL (low grade squamous intraepithelial dysplasia) 11/23/2013  . Dupuytren's contracture of both hands 11/15/2013  . Hallux valgus of right foot 11/15/2013  . Back pain 05/21/2013  . UTI (urinary tract infection) 05/21/2013  . Multiple skin nodules 11/27/2012  . Fibromyalgia 09/08/2012  . Osteoarthritis 09/08/2012  . Hypertension 07/14/2012  . Neck muscle spasm 07/14/2012  . Plantar wart 07/14/2012  . Insomnia 07/14/2012    Darrel Hoover PT 10/28/2014, 4:10 PM  [facility] [address] [place], [place], [address] Phone: [phone]   Fax:  [phone]    PHYSICAL THERAPY DISCHARGE SUMMARY  Visits from Start of Care: 1  Current functional level related to goals / functional outcomes: Unknown as she canceled or no showed her scheduled PT appointments   Remaining deficits: Unknown  Education / Equipment: Unknown Plan:  Patient goals were not met. Patient is being discharged due to not returning since the last visit.  ?????    Darrel Hoover,  PT     03/17/15       10:10  AM

## 2014-10-28 NOTE — Patient Instructions (Signed)

## 2014-11-05 ENCOUNTER — Ambulatory Visit: Payer: No Typology Code available for payment source | Admitting: Physical Therapy

## 2014-11-08 ENCOUNTER — Other Ambulatory Visit: Payer: Self-pay | Admitting: *Deleted

## 2014-11-08 MED ORDER — FLUTICASONE PROPIONATE 50 MCG/ACT NA SUSP: NASAL | Status: DC

## 2014-11-11 ENCOUNTER — Encounter: Payer: Self-pay | Admitting: Podiatry

## 2014-11-11 ENCOUNTER — Ambulatory Visit (INDEPENDENT_AMBULATORY_CARE_PROVIDER_SITE_OTHER): Payer: No Typology Code available for payment source | Admitting: Podiatry

## 2014-11-11 ENCOUNTER — Ambulatory Visit (INDEPENDENT_AMBULATORY_CARE_PROVIDER_SITE_OTHER): Payer: No Typology Code available for payment source

## 2014-11-11 VITALS — BP 119/75 | HR 97 | Resp 12

## 2014-11-11 DIAGNOSIS — Z9889 Other specified postprocedural states: Secondary | ICD-10-CM

## 2014-11-11 DIAGNOSIS — M205X1 Other deformities of toe(s) (acquired), right foot: Secondary | ICD-10-CM

## 2014-11-11 DIAGNOSIS — M2011 Hallux valgus (acquired), right foot: Secondary | ICD-10-CM

## 2014-11-11 NOTE — Progress Notes (Signed)
Patient ID: Isabel Evans, female   DOB: 1970-05-03, 44 y.o.   MRN: [id number]  Subjective: 44 year old female presents the office today status post right foot [place] bunionectomy, fifth metatarsal osteotomy, fourth digit hammertoe repair. She states that she is continuing the CAM walker. She did have an accident this past weekend which she did injure her foot hitting the on the chair. She says her pain is controlled and not taking pain medication. She has been showering in the area wet every day.Denies any systemic complaints as fevers, chills, nausea, vomiting. Denies any calf pain, chest pain, shortness of breath.  Objective: AAO 3, NAD DP/PT pulses couple, CRT less than 3 seconds Protective sensation appears intact with Simms Weinstein monofilament Incisions are all well coapted without any evidence of dehiscence and scars formed.there is no swelling erythema, ascending cellulitis, fluctuance, crepitus, drainage, purulence, malodor. There is no clinical signs of infection. There is no cystic and tenderness over the surgical sites. There is no pain with first MTPJ range of motion or restriction. K wires intact of the fourth digit without drainage or erythema. No other areas of tenderness to bilateral lower returns. No other open lesions or pre-ulcerative lesions. No pain with calf compression, swelling, warmth, erythema.  Assessment: 44 year old female status post right foot surgery, doing well  Plan: -X-rays were obtained and reviewed with the patient.  -Treatment options discussed including all alternatives, risks, and complications -At this time the K wire was removed without, occasions in total. Antibiotic ointment is placed over the pin sites followed by a bandage. Can remove the bandage tomorrow as long as the holes close she can start to shower. -She can start to transition back into a regular sneaker as tolerated. Discussed with her to start this on a gradual basis. Is an increase in pain  return to the [employer]. -Pain medication as needed -Ice and elevation -Follow-up 3 weeks or sooner if any problems arise. In the meantime, encouraged to call the office with any questions, concerns, change in symptoms.   Celesta Gentile, DPM

## 2014-11-12 ENCOUNTER — Ambulatory Visit: Payer: No Typology Code available for payment source | Admitting: Physical Therapy

## 2014-11-14 ENCOUNTER — Encounter: Payer: No Typology Code available for payment source | Admitting: Physical Therapy

## 2014-11-25 ENCOUNTER — Ambulatory Visit: Payer: Self-pay | Attending: Orthopaedic Surgery

## 2014-11-27 ENCOUNTER — Ambulatory Visit: Payer: Self-pay

## 2014-12-02 ENCOUNTER — Encounter: Payer: No Typology Code available for payment source | Admitting: Podiatry

## 2014-12-05 NOTE — Progress Notes (Signed)
No show

## 2014-12-09 ENCOUNTER — Encounter: Payer: No Typology Code available for payment source | Admitting: Podiatry

## 2015-01-02 ENCOUNTER — Inpatient Hospital Stay (HOSPITAL_COMMUNITY): Admission: RE | Admit: 2015-01-02 | Payer: No Typology Code available for payment source | Source: Ambulatory Visit

## 2015-01-14 ENCOUNTER — Other Ambulatory Visit: Payer: Self-pay | Admitting: Internal Medicine

## 2015-01-17 ENCOUNTER — Other Ambulatory Visit: Payer: Self-pay | Admitting: *Deleted

## 2015-01-17 MED ORDER — CYCLOBENZAPRINE HCL 10 MG PO TABS: ORAL_TABLET | ORAL | Status: DC

## 2015-01-17 NOTE — Telephone Encounter (Signed)
Patient presents to the clinic checking on the status of her refill for the below mentioned medication. Please follow up with pt. Thank you.

## 2015-01-17 NOTE — Telephone Encounter (Signed)
Patient called to request a med refill for cyclobenzaprine (FLEXERIL) 10 MG tablet. Please f/u with pt.

## 2015-01-17 NOTE — Telephone Encounter (Signed)
Patient came in to request refill on Flexeril. Medical Assistant filled request.

## 2015-02-14 ENCOUNTER — Other Ambulatory Visit: Payer: Self-pay | Admitting: Internal Medicine

## 2015-02-18 ENCOUNTER — Telehealth: Payer: Self-pay | Admitting: Internal Medicine

## 2015-02-20 NOTE — Telephone Encounter (Signed)
Pt. Needs medication refills for omeprazole (PRILOSEC) 40 MG capsule [[id number]],IDHWYSHUOHF (ATARAX/VISTARIL) 25 MG tablet [[id number]] ,fluticasone (FLONASE) 50 MCG/ACT nasal spray [[id number]]...Marland Kitchenplease send to [facility].Marland KitchenMarland KitchenMarland Kitchen

## 2015-02-21 ENCOUNTER — Other Ambulatory Visit: Payer: Self-pay | Admitting: *Deleted

## 2015-02-21 DIAGNOSIS — F411 Generalized anxiety disorder: Secondary | ICD-10-CM

## 2015-02-21 DIAGNOSIS — F439 Reaction to severe stress, unspecified: Secondary | ICD-10-CM

## 2015-02-21 MED ORDER — FLUTICASONE PROPIONATE 50 MCG/ACT NA SUSP
NASAL | Status: DC
Start: 2015-02-21 — End: 2015-11-17

## 2015-02-21 MED ORDER — HYDROXYZINE HCL 25 MG PO TABS: 25.0000 mg | ORAL_TABLET | Freq: Three times a day (TID) | ORAL | Status: DC | PRN

## 2015-02-21 MED ORDER — OMEPRAZOLE 40 MG PO CPDR: 40.0000 mg | DELAYED_RELEASE_CAPSULE | Freq: Every day | ORAL | Status: DC

## 2015-02-21 NOTE — Telephone Encounter (Signed)
Patients Flonase, hydroxyzine, and omeprazole has been refilled.

## 2015-04-29 ENCOUNTER — Other Ambulatory Visit: Payer: Self-pay | Admitting: Internal Medicine

## 2015-04-29 MED FILL — ?OMEPRAZOLE DR 20 MG CAPSUL: 20 | 30 days supply | Qty: 60 | Fill #2

## 2015-04-29 MED FILL — ?HYDROXYZINE HCL 25 MG TAB: 25 | 10 days supply | Qty: 30 | Fill #2

## 2015-04-29 MED FILL — !CYMBALTA 20MG CAPSULE: 20 | 30 days supply | Qty: 60 | Fill #9

## 2015-04-29 MED FILL — GABAPENTIN 300 MG CAPSULE: 300 | 30 days supply | Qty: 90 | Fill #9

## 2015-04-29 MED FILL — DICYCLOMINE 10 MG CAPSULE: 10 | 10 days supply | Qty: 30 | Fill #3

## 2015-04-30 MED FILL — AMLODIPINE BESYLATE 10 MG T: 10 | 30 days supply | Qty: 30 | Fill #0

## 2015-05-01 MED FILL — ?CYCLOBENZAPRINE 10 MG TABL: 10 | 30 days supply | Qty: 60 | Fill #0

## 2015-05-05 ENCOUNTER — Ambulatory Visit: Payer: No Typology Code available for payment source | Attending: Internal Medicine

## 2015-05-05 VITALS — BP 120/77 | HR 88 | Temp 98.0°F | Resp 18

## 2015-05-05 DIAGNOSIS — Z029 Encounter for administrative examinations, unspecified: Secondary | ICD-10-CM | POA: Insufficient documentation

## 2015-05-05 DIAGNOSIS — I10 Essential (primary) hypertension: Secondary | ICD-10-CM

## 2015-05-05 NOTE — Progress Notes (Signed)
Patient comes in for BP check. Patient has been feeling tired, weak and heart feels like its flying. Patient gets light-headed when she stands up. Patient has spurs in neck and has been having pain, just picked up flexeril and explains hopefully that will work. Patients VS are as follows: 120/77, 98.0, 88, 18, 99% O2 on room air.   Nurse explained to patient to drink water and hydrate to help with symptoms. Patient reports, currently feeling okay. Patient has appointment to see Dr. Doreene Burke on Thursday, May 08, 2015 at 9:45am.

## 2015-05-07 ENCOUNTER — Other Ambulatory Visit: Payer: Self-pay | Admitting: Internal Medicine

## 2015-05-07 DIAGNOSIS — M797 Fibromyalgia: Secondary | ICD-10-CM

## 2015-05-07 MED ORDER — DULOXETINE HCL 40 MG PO CPEP: 40.0000 mg | ORAL_CAPSULE | Freq: Every day | ORAL | Status: DC

## 2015-05-08 ENCOUNTER — Encounter (HOSPITAL_BASED_OUTPATIENT_CLINIC_OR_DEPARTMENT_OTHER): Payer: No Typology Code available for payment source | Admitting: Clinical

## 2015-05-08 ENCOUNTER — Encounter: Payer: Self-pay | Admitting: Internal Medicine

## 2015-05-08 ENCOUNTER — Ambulatory Visit: Payer: No Typology Code available for payment source | Attending: Internal Medicine | Admitting: Internal Medicine

## 2015-05-08 VITALS — BP 127/81 | HR 81 | Temp 98.1°F | Resp 18 | Ht 71.0 in

## 2015-05-08 DIAGNOSIS — M21619 Bunion of unspecified foot: Secondary | ICD-10-CM | POA: Insufficient documentation

## 2015-05-08 DIAGNOSIS — F329 Major depressive disorder, single episode, unspecified: Secondary | ICD-10-CM | POA: Insufficient documentation

## 2015-05-08 DIAGNOSIS — M797 Fibromyalgia: Secondary | ICD-10-CM

## 2015-05-08 DIAGNOSIS — I10 Essential (primary) hypertension: Secondary | ICD-10-CM | POA: Insufficient documentation

## 2015-05-08 DIAGNOSIS — M2010 Hallux valgus (acquired), unspecified foot: Secondary | ICD-10-CM | POA: Insufficient documentation

## 2015-05-08 DIAGNOSIS — K219 Gastro-esophageal reflux disease without esophagitis: Secondary | ICD-10-CM | POA: Insufficient documentation

## 2015-05-08 DIAGNOSIS — Z0000 Encounter for general adult medical examination without abnormal findings: Secondary | ICD-10-CM | POA: Insufficient documentation

## 2015-05-08 DIAGNOSIS — M19041 Primary osteoarthritis, right hand: Secondary | ICD-10-CM | POA: Insufficient documentation

## 2015-05-08 DIAGNOSIS — F4323 Adjustment disorder with mixed anxiety and depressed mood: Secondary | ICD-10-CM | POA: Insufficient documentation

## 2015-05-08 DIAGNOSIS — Z23 Encounter for immunization: Secondary | ICD-10-CM | POA: Insufficient documentation

## 2015-05-08 DIAGNOSIS — F411 Generalized anxiety disorder: Secondary | ICD-10-CM

## 2015-05-08 DIAGNOSIS — J342 Deviated nasal septum: Secondary | ICD-10-CM | POA: Insufficient documentation

## 2015-05-08 DIAGNOSIS — M19042 Primary osteoarthritis, left hand: Secondary | ICD-10-CM | POA: Insufficient documentation

## 2015-05-08 DIAGNOSIS — K589 Irritable bowel syndrome without diarrhea: Secondary | ICD-10-CM | POA: Insufficient documentation

## 2015-05-08 MED ORDER — CLONAZEPAM 0.5 MG PO TABS: 0.5000 mg | ORAL_TABLET | Freq: Two times a day (BID) | ORAL | Status: DC | PRN

## 2015-05-08 MED ORDER — DULOXETINE HCL 30 MG PO CPEP: 30.0000 mg | ORAL_CAPSULE | Freq: Two times a day (BID) | ORAL | Status: DC

## 2015-05-08 MED FILL — DULoxetine HCL 30 MG CPEP: 30 | 30 days supply | Qty: 60 | Fill #0

## 2015-05-08 MED FILL — clonazePAM 0.5 MG TABS: 0.5 | 10 days supply | Qty: 20 | Fill #0

## 2015-05-08 NOTE — Progress Notes (Signed)
Patient ID: Isabel Evans, female   DOB: 07/17/1970, 45 y.o.   MRN: [id number]   Isabel Evans, is a 46 y.o. female  [id number]  [id number]  DOB - 02-14-1971  Chief Complaint  Patient presents with  . Depression        Subjective:   Isabel Evans is a 45 y.o. female history of irritable bowel syndrome, fibromyalgia, major depression and anxiety, hypertension and GERD here today for a follow up visit.  Patient is tearful , has lost too many family members this year including his dad that died of terminal cancer, her sister is having trouble with the law and may go to jail, her baby daddy also died in a car wreck recently. Patient is having trouble coping with all this and has not been to sleep well or when she sleeps she does excessively to the extent that she is having trouble with her job. She is compliant with her medications , she has no other complaint today. She denies any suicidal ideation or thoughts.  She has no symptoms suggestive of thyroid disease. No urinary symptoms. No memory problem. Patient has No headache, No chest pain, No abdominal pain - No Nausea, No new weakness tingling or numbness, No Cough - SOB.  Problem  Healthcare Maintenance  Adjustment Disorder With Mixed Anxiety and Depressed Mood    ALLERGIES: No Known Allergies  PAST MEDICAL HISTORY: Past Medical History  Diagnosis Date  . GERD (gastroesophageal reflux disease)   . IBS (irritable bowel syndrome)     no current med.  . Fibromyalgia   . Sinus headache   . Deviated nasal septum     states is unable to lie flat, because her nose will become congested and she can stop breathing  . Osteoarthritis     hands  . Bone spur     cervical spine  . Hypertension     states is under control with med., has been on med. x 8 mos.  . 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 Hammertoe 09/2014    right 4th, 5th    MEDICATIONS AT HOME: Prior to Admission medications   Medication Sig  Start Date End Date Taking? Authorizing Provider  amLODipine (NORVASC) 10 MG tablet TAKE 1 TABLET BY MOUTH ONCE DAILY 02/25/15  Yes Tresa Garter, MD  cephALEXin (KEFLEX) 500 MG capsule Take 1 capsule (500 mg total) by mouth 3 (three) times daily. 10/04/14  Yes Trula Slade, DPM  cyclobenzaprine (FLEXERIL) 10 MG tablet TAKE 1 TABLET BY MOUTH TWICE DAILY AS NEEDED FOR MUSCLE SPASMS 05/01/15  Yes Tresa Garter, MD  dicyclomine (BENTYL) 10 MG capsule Take 1 capsule (10 mg total) by mouth 4 (four) times daily -  before meals and at bedtime. 10/24/14  Yes Tresa Garter, MD  fluticasone (FLONASE) 50 MCG/ACT nasal spray INSTILL 2 SPRAYS INTO EACH NOSTRIL ONCE A DAY 02/21/15  Yes Tresa Garter, MD  gabapentin (NEURONTIN) 300 MG capsule Take 1 capsule (300 mg total) by mouth 3 (three) times daily. 05/23/14  Yes Tresa Garter, MD  hydrOXYzine (ATARAX/VISTARIL) 25 MG tablet Take 1 tablet (25 mg total) by mouth 3 (three) times daily as needed. 02/21/15  Yes Tresa Garter, MD  omeprazole (PRILOSEC) 40 MG capsule Take 1 capsule (40 mg total) by mouth daily. 02/21/15  Yes Tresa Garter, MD  promethazine (PHENERGAN) 12.5 MG tablet Take 1 tablet (12.5 mg total) by mouth every 6 (six) hours as  needed for nausea or vomiting. 10/04/14  Yes Trula Slade, DPM  clonazePAM (KLONOPIN) 0.5 MG tablet Take 1 tablet (0.5 mg total) by mouth 2 (two) times daily as needed for anxiety. 05/08/15   Tresa Garter, MD  DULoxetine (CYMBALTA) 30 MG capsule Take 1 capsule (30 mg total) by mouth 2 (two) times daily. 05/08/15   Tresa Garter, MD  fluconazole (DIFLUCAN) 200 MG tablet Take 1 tablet (200 mg total) by mouth once. Then repeat another dose in one week following the first dose. Patient not taking: Reported on 05/08/2015 09/23/14   Tresa Garter, MD  HYDROcodone-acetaminophen (NORCO/VICODIN) 5-325 MG per tablet Take 1-2 tablets by mouth every 6 (six) hours as needed for moderate  pain (every 4-6 hours prn foot pain.). Patient not taking: Reported on 05/08/2015 10/17/14   Trula Slade, DPM  oxyCODONE-acetaminophen (ROXICET) 5-325 MG per tablet Take 1-2 tablets by mouth every 6 (six) hours as needed for severe pain. Patient not taking: Reported on 05/08/2015 10/04/14   Trula Slade, DPM     Objective:   Filed Vitals:   05/08/15 0953  BP: 127/81  Pulse: 81  Temp: 98.1 F (36.7 C)  TempSrc: Oral  Resp: 18  Height: 5\' 11"  (1.803 m)  SpO2: 99%    Exam General appearance : Awake, alert, not in any distress. Speech Clear. Not toxic looking HEENT: Atraumatic and Normocephalic, pupils equally reactive to light and accomodation Neck: supple, no JVD. No cervical lymphadenopathy.  Chest:Good air entry bilaterally, no added sounds  CVS: S1 S2 regular, no murmurs.  Abdomen: Bowel sounds present, Non tender and not distended with no gaurding, rigidity or rebound. Extremities: B/L Lower Ext shows no edema, both legs are warm to touch Neurology: Awake alert, and oriented X 3, CN II-XII intact, Non focal Skin:No Rash  Data Review No results found for: HGBA1C   Assessment & Plan   1. Healthcare maintenance  - Flu Vaccine QUAD 36+ mos PF IM (Fluarix & Fluzone Quad PF)  2. Fibromyalgia  Increase dosage - DULoxetine (CYMBALTA) 30 MG capsule; Take 1 capsule (30 mg total) by mouth 2 (two) times daily.  Dispense: 60 capsule; Refill: 3  3. Adjustment disorder with mixed anxiety and depressed mood  - clonazePAM (KLONOPIN) 0.5 MG tablet; Take 1 tablet (0.5 mg total) by mouth 2 (two) times daily as needed for anxiety.  Dispense: 20 tablet; Refill: 1  Increase - DULoxetine (CYMBALTA) 30 MG capsule; Take 1 capsule (30 mg total) by mouth 2 (two) times daily.  Dispense: 60 capsule; Refill: 3   Patient was given the same day appointment with our clinical social worker today for counseling and support  Patient have been counseled extensively about nutrition and  exercise  Return in about 3 months (around 08/06/2015) for Generalized Anxiety Disorder, Follow up HTN, Follow up Pain and comorbidities.  The patient was given clear instructions to go to ER or return to medical center if symptoms don't improve, worsen or new problems develop. The patient verbalized understanding. The patient was told to call to get lab results if they haven't heard anything in the next week.   This note has been created with Surveyor, quantity. Any transcriptional errors are unintentional.    Angelica Chessman, MD, [place], Karilyn Cota, [place] [facility] [place], [place]   05/08/2015, 10:46 AM

## 2015-05-08 NOTE — Progress Notes (Signed)
ASSESSMENT: Pt experiencing symptoms of generalized anxiety disorder, needs to f/u with PCP and [facility]; would benefit from psychoeducation and brief therapy regarding coping with symptoms of anxiety. Stage of Change: contemplative  PLAN: 1. F/U with behavioral health consultant in one month, or as needed 2. Psychiatric Medications: hydroxyzine, klonopin (starting today, short-term) 3. Behavioral recommendation(s):   -Consider writing down all life stressors in one place -Consider putting note by bedside as reminder of one to-do item to get out of bed for today -Consider reading educational material regarding coping with stress  SUBJECTIVE: Pt. referred by Dr Doreene Burke for symptoms of anxiety and depression:  Pt. reports the following symptoms/concerns: Pt states that at times she has difficulty getting motivated to get out of bed and sleeps too much, she is "stressed" about many family issues, cannot stop worrying about family, work, home. Pt says the hydroxyzine is not working as well anymore.  Duration of problem: increasing in at least one month Severity: severe  OBJECTIVE: Orientation & Cognition: Oriented x3. Thought processes normal and appropriate to situation. Mood: teary. Affect: appropriate Appearance: appropriate Risk of harm to self or others: no known risk of harm to self or others Substance use: alcohol Assessments administered: PHQ9: 20/ GAD7: 17  Diagnosis: Generalized anxiety disorder CPT Code: F41.1 -------------------------------------------- Other(s) present in the room: none  Time spent with patient in exam room: 30 minutes

## 2015-05-08 NOTE — Patient Instructions (Signed)
Major Depressive Disorder Major depressive disorder is a mental illness. It also may be called clinical depression or unipolar depression. Major depressive disorder usually causes feelings of sadness, hopelessness, or helplessness. Some people with this disorder do not feel particularly sad but lose interest in doing things they used to enjoy (anhedonia). Major depressive disorder also can cause physical symptoms. It can interfere with work, school, relationships, and other normal everyday activities. The disorder varies in severity but is longer lasting and more serious than the sadness we all feel from time to time in our lives. Major depressive disorder often is triggered by stressful life events or major life changes. Examples of these triggers include divorce, loss of your job or home, a move, and the death of a family member or close friend. Sometimes this disorder occurs for no obvious reason at all. People who have family members with major depressive disorder or bipolar disorder are at higher risk for developing this disorder, with or without life stressors. Major depressive disorder can occur at any age. It may occur just once in your life (single episode major depressive disorder). It may occur multiple times (recurrent major depressive disorder). SYMPTOMS People with major depressive disorder have either anhedonia or depressed mood on nearly a daily basis for at least 2 weeks or longer. Symptoms of depressed mood include:  Feelings of sadness (blue or down in the dumps) or emptiness.  Feelings of hopelessness or helplessness.  Tearfulness or episodes of crying (may be observed by others).  Irritability (children and adolescents). In addition to depressed mood or anhedonia or both, people with this disorder have at least four of the following symptoms:  Difficulty sleeping or sleeping too much.   Significant change (increase or decrease) in appetite or weight.   Lack of energy or  motivation.  Feelings of guilt and worthlessness.   Difficulty concentrating, remembering, or making decisions.  Unusually slow movement (psychomotor retardation) or restlessness (as observed by others).   Recurrent wishes for death, recurrent thoughts of self-harm (suicide), or a suicide attempt. People with major depressive disorder commonly have persistent negative thoughts about themselves, other people, and the world. People with severe major depressive disorder may experiencedistorted beliefs or perceptions about the world (psychotic delusions). They also may see or hear things that are not real (psychotic hallucinations). DIAGNOSIS Major depressive disorder is diagnosed through an assessment by your health care provider. Your health care provider will ask aboutaspects of your daily life, such as mood,sleep, and appetite, to see if you have the diagnostic symptoms of major depressive disorder. Your health care provider may ask about your medical history and use of alcohol or drugs, including prescription medicines. Your health care provider also may do a physical exam and blood work. This is because certain medical conditions and the use of certain substances can cause major depressive disorder-like symptoms (secondary depression). Your health care provider also may refer you to a mental health specialist for further evaluation and treatment. TREATMENT It is important to recognize the symptoms of major depressive disorder and seek treatment. The following treatments can be prescribed for this disorder:   Medicine. Antidepressant medicines usually are prescribed. Antidepressant medicines are thought to correct chemical imbalances in the brain that are commonly associated with major depressive disorder. Other types of medicine may be added if the symptoms do not respond to antidepressant medicines alone or if psychotic delusions or hallucinations occur.  Talk therapy. Talk therapy can be  helpful in treating major depressive disorder by providing   support, education, and guidance. Certain types of talk therapy also can help with negative thinking (cognitive behavioral therapy) and with relationship issues that trigger this disorder (interpersonal therapy). A mental health specialist can help determine which treatment is best for you. Most people with major depressive disorder do well with a combination of medicine and talk therapy. Treatments involving electrical stimulation of the brain can be used in situations with extremely severe symptoms or when medicine and talk therapy do not work over time. These treatments include electroconvulsive therapy, transcranial magnetic stimulation, and vagal nerve stimulation.   This information is not intended to replace advice given to you by your health care provider. Make sure you discuss any questions you have with your health care provider.   Document Released: 07/31/2012 Document Revised: 04/26/2014 Document Reviewed: 07/31/2012 Elsevier Interactive Patient Education 2016 Elsevier Inc. Generalized Anxiety Disorder Generalized anxiety disorder (GAD) is a mental disorder. It interferes with life functions, including relationships, work, and school. GAD is different from normal anxiety, which everyone experiences at some point in their lives in response to specific life events and activities. Normal anxiety actually helps [place] prepare for and get through these life events and activities. Normal anxiety goes away after the event or activity is over.  GAD causes anxiety that is not necessarily related to specific events or activities. It also causes excess anxiety in proportion to specific events or activities. The anxiety associated with GAD is also difficult to control. GAD can vary from mild to severe. People with severe GAD can have intense waves of anxiety with physical symptoms (panic attacks).  SYMPTOMS The anxiety and worry associated with GAD  are difficult to control. This anxiety and worry are related to many life events and activities and also occur more days than not for 6 months or longer. People with GAD also have three or more of the following symptoms (one or more in children):  Restlessness.   Fatigue.  Difficulty concentrating.   Irritability.  Muscle tension.  Difficulty sleeping or unsatisfying sleep. DIAGNOSIS GAD is diagnosed through an assessment by your health care provider. Your health care provider will ask you questions aboutyour mood,physical symptoms, and events in your life. Your health care provider may ask you about your medical history and use of alcohol or drugs, including prescription medicines. Your health care provider may also do a physical exam and blood tests. Certain medical conditions and the use of certain substances can cause symptoms similar to those associated with GAD. Your health care provider may refer you to a mental health specialist for further evaluation. TREATMENT The following therapies are usually used to treat GAD:   Medication. Antidepressant medication usually is prescribed for long-term daily control. Antianxiety medicines may be added in severe cases, especially when panic attacks occur.   Talk therapy (psychotherapy). Certain types of talk therapy can be helpful in treating GAD by providing support, education, and guidance. A form of talk therapy called cognitive behavioral therapy can teach you healthy ways to think about and react to daily life events and activities.  Stress managementtechniques. These include yoga, meditation, and exercise and can be very helpful when they are practiced regularly. A mental health specialist can help determine which treatment is best for you. Some people see improvement with one therapy. However, other people require a combination of therapies.   This information is not intended to replace advice given to you by your health care  provider. Make sure you discuss any questions you have with your  with your health care provider. °  °Document Released: 07/31/2012 Document Revised: 04/26/2014 Document Reviewed: 07/31/2012 °Elsevier Interactive Patient Education ©2016 Elsevier Inc. ° °

## 2015-05-08 NOTE — Patient Instructions (Signed)
PLAN:  1. F/U with behavioral health consultant in one month, or earlier as needed 2. Psychiatric Medications: hydroxyzine, klonopin(starting today, short-term) 3. Behavioral recommendation(s):  -Consider writing down all life stressors in one place -Consider putting note by bedside as reminder of one to-do item to get out of bed for today -Consider reading educational material regarding coping with stress

## 2015-05-08 NOTE — Progress Notes (Signed)
Patient is here for Depression  Patient complains of chronic neck and shoulder pain described as aching and scaled at a 4 currently.  Patient would like her flu shot.  Patient would like refill on [place].  Patient tolerated the flu shot well.

## 2015-05-21 ENCOUNTER — Other Ambulatory Visit: Payer: Self-pay | Admitting: Internal Medicine

## 2015-05-21 DIAGNOSIS — F4323 Adjustment disorder with mixed anxiety and depressed mood: Secondary | ICD-10-CM

## 2015-05-21 DIAGNOSIS — M797 Fibromyalgia: Secondary | ICD-10-CM

## 2015-05-21 MED ORDER — DULOXETINE HCL 30 MG PO CPEP: 30.0000 mg | ORAL_CAPSULE | Freq: Two times a day (BID) | ORAL | Status: DC

## 2015-06-05 ENCOUNTER — Ambulatory Visit: Payer: No Typology Code available for payment source | Admitting: Clinical

## 2015-06-09 ENCOUNTER — Other Ambulatory Visit: Payer: Self-pay | Admitting: Internal Medicine

## 2015-06-09 MED FILL — DULoxetine HCL 30 MG CPEP: 30 | 30 days supply | Qty: 60 | Fill #1

## 2015-06-10 MED FILL — AMLODIPINE BESYLATE 10 MG T: 10 | 30 days supply | Qty: 30 | Fill #1

## 2015-06-10 MED FILL — CYCLOBENZAPRINE 10 MG TAB: 10 | 30 days supply | Qty: 60 | Fill #1

## 2015-06-10 MED FILL — GABAPENTIN 300 MG CAPSULE: 300 | 30 days supply | Qty: 90 | Fill #0

## 2015-06-10 MED FILL — clonazePAM 0.5 MG TABS: 0.5 | 10 days supply | Qty: 20 | Fill #1

## 2015-06-10 MED FILL — ?HYDROXYZINE HCL 25 MG TAB: 25 | 10 days supply | Qty: 30 | Fill #3

## 2015-06-10 MED FILL — OMEPRAZOLE DR 20 MG CAPSULE: 20 | 30 days supply | Qty: 60 | Fill #3

## 2015-06-17 ENCOUNTER — Encounter: Payer: Self-pay | Admitting: Clinical

## 2015-06-17 NOTE — Progress Notes (Signed)
Depression screen [facility] 2/9 05/05/2015 09/19/2014 01/25/2013 01/11/2013 11/27/2012  Decreased Interest 1 0 0 2 0  Down, Depressed, Hopeless 2 0 0 2 0  PHQ - 2 Score 3 0 0 4 0  Altered sleeping - - - 2 -  Tired, decreased energy - - - 2 -  Change in appetite - - - 2 -  Feeling bad or failure about yourself  - - - 2 -  Trouble concentrating - - - 2 -  Moving slowly or fidgety/restless - - - 2 -  Suicidal thoughts - - - 0 -  PHQ-9 Score - - - 16 -    05-05-15: PHQ9- 17 (2,2,2,3,2,2,2,2,0)/ GAD7- 15 (2,2,3,3,2,2,1) 05-08-15: PHQ9: 20                             / GAD7- 15

## 2015-07-06 ENCOUNTER — Other Ambulatory Visit: Payer: Self-pay | Admitting: Internal Medicine

## 2015-07-07 ENCOUNTER — Other Ambulatory Visit: Payer: Self-pay

## 2015-07-07 ENCOUNTER — Other Ambulatory Visit: Payer: Self-pay | Admitting: Internal Medicine

## 2015-07-07 DIAGNOSIS — Z1231 Encounter for screening mammogram for malignant neoplasm of breast: Secondary | ICD-10-CM

## 2015-07-07 MED FILL — GABAPENTIN 300 MG CAPSULE: 300 | 30 days supply | Qty: 90 | Fill #1

## 2015-07-07 MED FILL — DULoxetine HCL 30 MG CPEP: 30 | 30 days supply | Qty: 60 | Fill #2

## 2015-07-07 MED FILL — AMLODIPINE BESYLATE 10 MG T: 10 | 30 days supply | Qty: 30 | Fill #2

## 2015-07-08 ENCOUNTER — Telehealth: Payer: Self-pay

## 2015-07-08 MED FILL — DICYCLOMINE 10 MG CAPSULE: 10 | 30 days supply | Qty: 120 | Fill #0

## 2015-07-08 MED FILL — CYCLOBENZAPRINE 10 MG TAB: 10 | 30 days supply | Qty: 60 | Fill #0

## 2015-07-08 MED FILL — ?HYDROXYZINE HCL 25 MG TAB: 25 | 10 days supply | Qty: 30 | Fill #0

## 2015-07-08 MED FILL — OMEPRAZOLE DR 20 MG CAPSULE: 20 | 30 days supply | Qty: 60 | Fill #0

## 2015-07-08 NOTE — Telephone Encounter (Signed)
Returned patient phone call Patient not available Message left on voice mail to return our call 

## 2015-07-11 ENCOUNTER — Ambulatory Visit
Admission: RE | Admit: 2015-07-11 | Discharge: 2015-07-11 | Disposition: A | Discharge status: Home / Self Care | Payer: No Typology Code available for payment source | Source: Ambulatory Visit

## 2015-07-11 DIAGNOSIS — Z1231 Encounter for screening mammogram for malignant neoplasm of breast: Secondary | ICD-10-CM

## 2015-07-11 MED FILL — clonazePAM 0.5 MG TABS: 0.5 | 10 days supply | Qty: 20 | Fill #0

## 2015-07-25 ENCOUNTER — Telehealth: Payer: Self-pay | Admitting: Clinical

## 2015-07-25 NOTE — Telephone Encounter (Signed)
F/u with pt, and informed her that [place] will be leaving [facility] in three weeks, so she may make appointment before end April 2017, as needed. Pt says her [place] medications seem to be working better now, and she appreciates checking on her.

## 2015-07-31 ENCOUNTER — Telehealth: Payer: Self-pay | Admitting: *Deleted

## 2015-07-31 NOTE — Telephone Encounter (Signed)
Patient verified DOB Patient is aware of mammogram showed no evidence of malignancy. Patient advised to have one completed in one year. No further questions at this time.

## 2015-07-31 NOTE — Telephone Encounter (Signed)
-----   Message from Tresa Garter, MD sent at 07/29/2015  3:20 PM EDT ----- Please inform patient that her screening mammogram shows no evidence of malignancy. Recommend screening mammogram in one year

## 2015-08-11 MED FILL — FLUTICASONE PROP 50 MCG SPR: 50 | 30 days supply | Qty: 16 | Fill #0

## 2015-08-11 MED FILL — AMLODIPINE BESYLATE 10 MG T: 10 | 30 days supply | Qty: 30 | Fill #3

## 2015-08-11 MED FILL — DULoxetine HCL 30 MG CPEP: 30 | 30 days supply | Qty: 60 | Fill #3

## 2015-08-11 MED FILL — GABAPENTIN 300 MG CAPSULE: 300 | 30 days supply | Qty: 90 | Fill #2

## 2015-08-11 MED FILL — clonazePAM 0.5 MG TABS: 0.5 | 10 days supply | Qty: 20 | Fill #1

## 2015-08-11 MED FILL — ?HYDROXYZINE HCL 25 MG TAB: 25 | 10 days supply | Qty: 30 | Fill #1

## 2015-08-11 MED FILL — ?OMEPRAZOLE DR 20 MG CAPSUL: 20 | 30 days supply | Qty: 60 | Fill #1

## 2015-08-22 MED FILL — ?CYCLOBENZAPRINE 10 MG TABL: 10 | 30 days supply | Qty: 60 | Fill #1

## 2015-09-23 ENCOUNTER — Other Ambulatory Visit: Payer: Self-pay | Admitting: Internal Medicine

## 2015-09-23 MED FILL — AMLODIPINE BESYLATE 10 MG T: 10 | 30 days supply | Qty: 30 | Fill #4

## 2015-09-23 MED FILL — ?HYDROXYZINE HCL 25 MG TAB: 25 | 10 days supply | Qty: 30 | Fill #0

## 2015-09-23 MED FILL — ?OMEPRAZOLE DR 20 MG CAPSUL: 20 | 30 days supply | Qty: 60 | Fill #2

## 2015-09-23 MED FILL — GABAPENTIN 300 MG CAPSULE: 300 | 30 days supply | Qty: 90 | Fill #3

## 2015-09-26 ENCOUNTER — Telehealth: Payer: Self-pay | Admitting: Internal Medicine

## 2015-09-26 NOTE — Telephone Encounter (Signed)
Called patient to inform that rx for klonopin is ready. Please follow up.

## 2015-10-06 ENCOUNTER — Other Ambulatory Visit: Payer: Self-pay | Admitting: Internal Medicine

## 2015-10-08 ENCOUNTER — Other Ambulatory Visit: Payer: Self-pay | Admitting: Internal Medicine

## 2015-10-08 MED ORDER — GABAPENTIN 300 MG PO CAPS: 300.0000 mg | ORAL_CAPSULE | Freq: Three times a day (TID) | ORAL | Status: DC

## 2015-10-26 ENCOUNTER — Other Ambulatory Visit: Payer: Self-pay | Admitting: Internal Medicine

## 2015-10-26 ENCOUNTER — Other Ambulatory Visit: Payer: Self-pay | Admitting: Family Medicine

## 2015-10-29 MED FILL — AMLODIPINE BESYLATE 10 MG T: 10 | 30 days supply | Qty: 30 | Fill #5

## 2015-10-29 MED FILL — OMEPRAZOLE DR 20 MG CAPSULE: 20 | 30 days supply | Qty: 60 | Fill #3

## 2015-10-29 MED FILL — DULoxetine HCL 30 MG CPEP: 30 | 30 days supply | Qty: 60 | Fill #0

## 2015-10-29 MED FILL — GABAPENTIN 300 MG CAPSULE: 300 | 30 days supply | Qty: 90 | Fill #4

## 2015-11-17 ENCOUNTER — Emergency Department (HOSPITAL_COMMUNITY)
Admission: EM | Admit: 2015-11-17 | Discharge: 2015-11-17 | Disposition: A | Discharge status: Home / Self Care | Payer: No Typology Code available for payment source | Attending: Emergency Medicine | Admitting: Emergency Medicine

## 2015-11-17 ENCOUNTER — Encounter (HOSPITAL_COMMUNITY): Payer: Self-pay | Admitting: Emergency Medicine

## 2015-11-17 DIAGNOSIS — R000 Tachycardia, unspecified: Secondary | ICD-10-CM | POA: Insufficient documentation

## 2015-11-17 DIAGNOSIS — R112 Nausea with vomiting, unspecified: Secondary | ICD-10-CM

## 2015-11-17 DIAGNOSIS — I10 Essential (primary) hypertension: Secondary | ICD-10-CM | POA: Insufficient documentation

## 2015-11-17 DIAGNOSIS — R197 Diarrhea, unspecified: Secondary | ICD-10-CM | POA: Insufficient documentation

## 2015-11-17 DIAGNOSIS — M549 Dorsalgia, unspecified: Secondary | ICD-10-CM | POA: Insufficient documentation

## 2015-11-17 DIAGNOSIS — Z79899 Other long term (current) drug therapy: Secondary | ICD-10-CM | POA: Insufficient documentation

## 2015-11-17 DIAGNOSIS — Z87891 Personal history of nicotine dependence: Secondary | ICD-10-CM | POA: Insufficient documentation

## 2015-11-17 LAB — CBC
HCT: 35.6 % — ABNORMAL LOW (ref 36.0–46.0)
Hemoglobin: 11.8 g/dL — ABNORMAL LOW (ref 12.0–15.0)
MCH: 27.2 pg (ref 26.0–34.0)
MCHC: 33.1 g/dL (ref 30.0–36.0)
MCV: 82 fL (ref 78.0–100.0)
PLATELETS: 254 10*3/uL (ref 150–400)
RBC: 4.34 MIL/uL (ref 3.87–5.11)
RDW: 15.8 % — ABNORMAL HIGH (ref 11.5–15.5)
WBC: 11.7 10*3/uL — AB (ref 4.0–10.5)

## 2015-11-17 LAB — COMPREHENSIVE METABOLIC PANEL
ALT: 12 U/L — AB (ref 14–54)
ANION GAP: 8 (ref 5–15)
AST: 17 U/L (ref 15–41)
Albumin: 4.1 g/dL (ref 3.5–5.0)
Alkaline Phosphatase: 78 U/L (ref 38–126)
BUN: 8 mg/dL (ref 6–20)
CALCIUM: 8.7 mg/dL — AB (ref 8.9–10.3)
CHLORIDE: 106 mmol/L (ref 101–111)
CO2: 21 mmol/L — ABNORMAL LOW (ref 22–32)
CREATININE: 0.79 mg/dL (ref 0.44–1.00)
Glucose, Bld: 150 mg/dL — ABNORMAL HIGH (ref 65–99)
Potassium: 3.4 mmol/L — ABNORMAL LOW (ref 3.5–5.1)
Sodium: 135 mmol/L (ref 135–145)
Total Bilirubin: 0.7 mg/dL (ref 0.3–1.2)
Total Protein: 7.5 g/dL (ref 6.5–8.1)

## 2015-11-17 LAB — URINALYSIS, ROUTINE W REFLEX MICROSCOPIC
BILIRUBIN URINE: NEGATIVE
Glucose, UA: NEGATIVE mg/dL
KETONES UR: NEGATIVE mg/dL
Leukocytes, UA: NEGATIVE
NITRITE: NEGATIVE
PH: 5.5 (ref 5.0–8.0)
Protein, ur: 30 mg/dL — AB
SPECIFIC GRAVITY, URINE: 1.017 (ref 1.005–1.030)

## 2015-11-17 LAB — URINE MICROSCOPIC-ADD ON

## 2015-11-17 LAB — I-STAT BETA HCG BLOOD, ED (MC, WL, AP ONLY): I-stat hCG, quantitative: <5 m[IU]/mL (ref <5)

## 2015-11-17 LAB — LIPASE, BLOOD: LIPASE: 18 U/L (ref 11–51)

## 2015-11-17 MED ORDER — SODIUM CHLORIDE 0.9 % IV SOLN
1000.0000 mL | Freq: Once | INTRAVENOUS | Status: AC
  Administered 2015-11-17: 1000 mL via INTRAVENOUS

## 2015-11-17 MED ORDER — HYDROMORPHONE HCL 1 MG/ML IJ SOLN
1.0000 mg | Freq: Once | INTRAMUSCULAR | Status: AC
  Administered 2015-11-17: 1 mg via INTRAVENOUS
  Filled 2015-11-17: qty 1

## 2015-11-17 MED ORDER — CYCLOBENZAPRINE HCL 10 MG PO TABS: 10.0000 mg | ORAL_TABLET | Freq: Three times a day (TID) | ORAL | 0 refills | Status: DC | PRN

## 2015-11-17 MED ORDER — ONDANSETRON HCL 4 MG/2ML IJ SOLN
4.0000 mg | Freq: Once | INTRAMUSCULAR | Status: AC
  Administered 2015-11-17: 4 mg via INTRAVENOUS
  Filled 2015-11-17: qty 2

## 2015-11-17 MED ORDER — METOCLOPRAMIDE HCL 5 MG/ML IJ SOLN: 10.0000 mg | Freq: Four times a day (QID) | INTRAMUSCULAR | Status: DC | PRN

## 2015-11-17 MED ORDER — ONDANSETRON 8 MG PO TBDP: 8.0000 mg | ORAL_TABLET | Freq: Three times a day (TID) | ORAL | 0 refills | Status: DC | PRN

## 2015-11-17 MED ORDER — DIAZEPAM 5 MG PO TABS
5.0000 mg | ORAL_TABLET | Freq: Once | ORAL | Status: AC
  Administered 2015-11-17: 5 mg via ORAL
  Filled 2015-11-17: qty 1

## 2015-11-17 MED ORDER — KETOROLAC TROMETHAMINE 30 MG/ML IJ SOLN
30.0000 mg | Freq: Once | INTRAMUSCULAR | Status: AC
  Administered 2015-11-17: 30 mg via INTRAVENOUS
  Filled 2015-11-17: qty 1

## 2015-11-17 MED ORDER — SODIUM CHLORIDE 0.9 % IV SOLN
1000.0000 mL | INTRAVENOUS | Status: DC
Start: 2015-11-17 — End: 2015-11-17
  Administered 2015-11-17: 1000 mL via INTRAVENOUS

## 2015-11-17 MED FILL — ONDANSETRON ODT 8 MG TABLET: 8 | 3 days supply | Qty: 10 | Fill #0

## 2015-11-17 MED FILL — CYCLOBENZAPRINE 10 MG TAB: 10 | 3 days supply | Qty: 12 | Fill #0

## 2015-11-17 NOTE — ED Notes (Signed)
Tolerating PO fluids °

## 2015-11-17 NOTE — ED Triage Notes (Signed)
Pt states yesterday she had chills like she had a fever and had lower back pain that goes all the way across her lower back  Pt states she has had N/V/D and headache

## 2015-11-17 NOTE — ED Provider Notes (Signed)
[facility] DEPT Provider Note   CSN: [id number] Arrival date & time: 11/17/15  [id number]  First Provider Contact:  First MD Initiated Contact with Patient 11/17/15 [phone]        History   Chief Complaint Chief Complaint  Patient presents with  . Back Pain  . Emesis  . Diarrhea    HPI Isabel Evans is a 45 y.o. female.  Patient presents to the emergency department with complaints of nausea vomiting and diarrhea as well as mild low back pain since throwing up last night.  No fevers or chills.  Mild headache.  Symptoms are moderate in severity.  She states that she was up all night having both vomiting and diarrhea at the same time.  No recent sick contacts.  Symptoms are moderate in severity.   The history is provided by the patient and medical records.    Past Medical History:  Diagnosis Date  . Bone spur    cervical spine  . Deviated nasal septum    states is unable to lie flat, because her nose will become congested and she can stop breathing  . Fibromyalgia   . GERD (gastroesophageal reflux disease)   . Hallux abductovalgus with bunions 09/2014   right   . Hammertoe 09/2014   right 4th, 5th  . Hypertension    states is under control with med., has been on med. x 8 mos.  . IBS (irritable bowel syndrome)    no current med.  . Osteoarthritis    hands  . Sinus headache     Patient Active Problem List   Diagnosis Date Noted  . Healthcare maintenance 05/08/2015  . Adjustment disorder with mixed anxiety and depressed mood 05/08/2015  . Stress at home 10/24/2014  . Irritable bowel syndrome 10/24/2014  . Anxiety state 10/24/2014  . Preop testing 09/19/2014  . Pap smear for cervical cancer screening 09/19/2014  . Midline low back pain without sciatica 08/12/2014  . [place] nodes 08/12/2014  . Contracture of joint, hand 08/12/2014  . Essential hypertension 02/11/2014  . Allergy 02/11/2014  . Screening for breast cancer 02/11/2014  . LGSIL (low grade squamous  intraepithelial dysplasia) 11/23/2013  . Dupuytren's contracture of both hands 11/15/2013  . Hallux valgus of right foot 11/15/2013  . Back pain 05/21/2013  . UTI (urinary tract infection) 05/21/2013  . Multiple skin nodules 11/27/2012  . Fibromyalgia 09/08/2012  . Osteoarthritis 09/08/2012  . Hypertension 07/14/2012  . Neck muscle spasm 07/14/2012  . Plantar wart 07/14/2012  . Insomnia 07/14/2012    Past Surgical History:  Procedure Laterality Date  . BUNIONECTOMY Right 10/04/2014   Procedure:  Altamese Alden, Emmaline Life;  Surgeon: Trula Slade, DPM;  Location: [facility];  Service: Podiatry;  Laterality: Right;  . COLONOSCOPY WITH PROPOFOL  02/13/2013  . DUPUYTREN / PALMAR FASCIOTOMY Right 07/18/2013   exc. of multiple nodules  . 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 HAMMER TOE SURGERY Right 10/04/2014   Procedure: HAMMER TOE REPAIR 4TH  AND 5TH  RIGHT FOOT  fourth toe fixation with k wire;  Surgeon: Trula Slade, DPM;  Location: [facility];  Service: Podiatry;  Laterality: Right;  . 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 TUBAL LIGATION      OB History    Gravida Para Term Preterm AB Living   3 3 3  0 0 3   SAB TAB Ectopic Multiple Live  Births   0 0 0 0         Home Medications    Prior to Admission medications   Medication Sig Start Date End Date Taking? Authorizing Provider  amLODipine (NORVASC) 10 MG tablet TAKE 1 TABLET BY MOUTH ONCE DAILY Patient taking differently: TAKE 10 MG BY MOUTH ONCE DAILY 02/25/15  Yes Olugbemiga E Jegede, MD  clonazePAM (KLONOPIN) 0.5 MG tablet TAKE 1 TABLET BY MOUTH TWICE DAILY AS NEEDED FOR ANXIETY Patient taking differently: TAKE 0.5 MG BY MOUTH TWICE DAILY AS NEEDED FOR ANXIETY 09/26/15  Yes Olugbemiga E Jegede, MD  DULoxetine (CYMBALTA) 30 MG capsule TAKE 1 CAPSULE BY MOUTH 2 TIMES DAILY Patient taking differently: TAKE 30 MG BY MOUTH 2 TIMES  DAILY 10/06/15  Yes Tresa Garter, MD  gabapentin (NEURONTIN) 300 MG capsule Take 1 capsule (300 mg total) by mouth 3 (three) times daily. 10/08/15  Yes Arnoldo Morale, MD  cyclobenzaprine (FLEXERIL) 10 MG tablet TAKE 1 TABLET BY MOUTH TWICE DAILY AS NEEDED FOR MUSCLE SPASMS Patient not taking: Reported on 11/17/2015 07/08/15   Tresa Garter, MD  fluticasone (FLONASE) 50 MCG/ACT nasal spray INSTILL 2 SPRAYS INTO EACH NOSTRIL ONCE A DAY Patient not taking: Reported on 11/17/2015 02/21/15   Tresa Garter, MD  hydrOXYzine (ATARAX/VISTARIL) 25 MG tablet TAKE 1 TABLET BY MOUTH 3 TIMES DAILY AS NEEDED. Patient not taking: Reported on 11/17/2015 09/23/15   Tresa Garter, MD  omeprazole (PRILOSEC) 20 MG capsule TAKE 2 CAPSULES BY MOUTH DAILY. Patient not taking: Reported on 11/17/2015 07/08/15   Tresa Garter, MD    Family History Family History  Problem Relation Age of Onset  . Hypertension Mother   . Diabetes Mother   . Heart disease Mother   . Hypertension Father   . Diabetes Father   . Prostate cancer Father   . Heart disease Father   . Alcohol abuse Father   . Breast cancer Sister   . Hypertension Sister   . Diabetes Sister   . Heart disease Maternal Grandmother     Great GM  . Breast cancer Maternal Grandmother   . Bone cancer Maternal Grandmother   . Breast cancer Maternal Aunt   . Ovarian cancer Maternal Aunt   . Colon cancer Maternal Aunt     great aunt  . Rheum arthritis Sister   . Colon cancer Son     Social History Social History  Substance Use Topics  . Smoking status: Former Smoker    Quit date: 08/24/2012  . Smokeless tobacco: Never Used  . Alcohol use Yes     Comment: occasionally     Allergies   Review of patient's allergies indicates no known allergies.   Review of Systems Review of Systems  Musculoskeletal: Positive for back pain.  All other systems reviewed and are negative.    Physical Exam Updated Vital Signs BP 116/75 (BP  Location: Right Arm)   Pulse 101   Temp 98.6 F (37 C) (Oral)   Resp 18   Ht 5' 9.5" (1.765 m)   Wt 182 lb (82.6 kg)   LMP 10/27/2015 Comment: spotting today   SpO2 96%   BMI 26.49 kg/m   Physical Exam  Constitutional: She is oriented to person, place, and time. She appears well-developed and well-nourished. No distress.  HENT:  Head: Normocephalic and atraumatic.  Eyes: EOM are normal.  Neck: Normal range of motion.  Cardiovascular: Regular rhythm and normal heart sounds.   Tachycardia  Pulmonary/Chest: Effort normal and breath sounds normal.  Abdominal: Soft. She exhibits no distension. There is no tenderness.  Musculoskeletal: Normal range of motion.  Neurological: She is alert and oriented to person, place, and time.  Skin: Skin is warm and dry.  Psychiatric: She has a normal mood and affect. Judgment normal.  Nursing note and vitals reviewed.    ED Treatments / Results  Labs (all labs ordered are listed, but only abnormal results are displayed) Labs Reviewed  COMPREHENSIVE METABOLIC PANEL - Abnormal; Notable for the following:       Result Value   Potassium 3.4 (*)    CO2 21 (*)    Glucose, Bld 150 (*)    Calcium 8.7 (*)    ALT 12 (*)    All other components within normal limits  CBC - Abnormal; Notable for the following:    WBC 11.7 (*)    Hemoglobin 11.8 (*)    HCT 35.6 (*)    RDW 15.8 (*)    All other components within normal limits  URINALYSIS, ROUTINE W REFLEX MICROSCOPIC (NOT AT [facility]) - Abnormal; Notable for the following:    APPearance CLOUDY (*)    Hgb urine dipstick LARGE (*)    Protein, ur 30 (*)    All other components within normal limits  URINE MICROSCOPIC-ADD ON - Abnormal; Notable for the following:    Squamous Epithelial / LPF 0-5 (*)    Bacteria, UA FEW (*)    All other components within normal limits  LIPASE, BLOOD  I-STAT BETA HCG BLOOD, ED ([facility], [facility], [facility] ONLY)    EKG  EKG Interpretation None       Radiology No results  found.  Procedures Procedures (including critical care time)  Medications Ordered in ED Medications  0.9 %  sodium chloride infusion (0 mLs Intravenous Stopped 11/17/15 0815)    Followed by  0.9 %  sodium chloride infusion (0 mLs Intravenous Stopped 11/17/15 1044)  metoCLOPramide (REGLAN) injection 10 mg (not administered)  ondansetron (ZOFRAN) injection 4 mg (4 mg Intravenous Given 11/17/15 0744)  HYDROmorphone (DILAUDID) injection 1 mg (1 mg Intravenous Given 11/17/15 0804)  diazepam (VALIUM) tablet 5 mg (5 mg Oral Given 11/17/15 1042)  ketorolac (TORADOL) 30 MG/ML injection 30 mg (30 mg Intravenous Given 11/17/15 1042)     Initial Impression / Assessment and Plan / ED Course  I have reviewed the triage vital signs and the nursing notes.  Pertinent labs & imaging results that were available during my care of the patient were reviewed by me and considered in my medical decision making (see chart for details).  Clinical Course    11:11 AM Patient feels much better at this time.  Repeat abdominal exam is without focal tenderness.  No indication for advanced imaging.  Feeling better after IV hydration and IV nausea medication.  Discharge home in good conditions.  Primary care follow-up.  Patient understands to return to the ER for new or worsening symptoms  Final Clinical Impressions(s) / ED Diagnoses   Final diagnoses:  Nausea vomiting and diarrhea    New Prescriptions New Prescriptions   CYCLOBENZAPRINE (FLEXERIL) 10 MG TABLET    Take 1 tablet (10 mg total) by mouth 3 (three) times daily as needed for muscle spasms.   ONDANSETRON (ZOFRAN ODT) 8 MG DISINTEGRATING TABLET    Take 1 tablet (8 mg total) by mouth every 8 (eight) hours as needed for nausea or vomiting.     Jola Schmidt, MD 11/17/15 1113

## 2015-11-27 ENCOUNTER — Other Ambulatory Visit: Payer: Self-pay | Admitting: Physician Assistant

## 2015-11-27 ENCOUNTER — Ambulatory Visit: Payer: No Typology Code available for payment source | Attending: Internal Medicine | Admitting: Physician Assistant

## 2015-11-27 VITALS — BP 112/76 | HR 73 | Temp 98.0°F | Resp 16 | Wt 179.6 lb

## 2015-11-27 DIAGNOSIS — D509 Iron deficiency anemia, unspecified: Secondary | ICD-10-CM

## 2015-11-27 DIAGNOSIS — K219 Gastro-esophageal reflux disease without esophagitis: Secondary | ICD-10-CM | POA: Insufficient documentation

## 2015-11-27 DIAGNOSIS — D72829 Elevated white blood cell count, unspecified: Secondary | ICD-10-CM | POA: Insufficient documentation

## 2015-11-27 DIAGNOSIS — R1030 Lower abdominal pain, unspecified: Secondary | ICD-10-CM | POA: Insufficient documentation

## 2015-11-27 DIAGNOSIS — R739 Hyperglycemia, unspecified: Secondary | ICD-10-CM

## 2015-11-27 DIAGNOSIS — M1990 Unspecified osteoarthritis, unspecified site: Secondary | ICD-10-CM | POA: Insufficient documentation

## 2015-11-27 DIAGNOSIS — M797 Fibromyalgia: Secondary | ICD-10-CM | POA: Insufficient documentation

## 2015-11-27 DIAGNOSIS — Z79899 Other long term (current) drug therapy: Secondary | ICD-10-CM | POA: Insufficient documentation

## 2015-11-27 DIAGNOSIS — I10 Essential (primary) hypertension: Secondary | ICD-10-CM | POA: Insufficient documentation

## 2015-11-27 DIAGNOSIS — K580 Irritable bowel syndrome with diarrhea: Secondary | ICD-10-CM | POA: Insufficient documentation

## 2015-11-27 DIAGNOSIS — R197 Diarrhea, unspecified: Secondary | ICD-10-CM

## 2015-11-27 LAB — CBC WITH DIFFERENTIAL/PLATELET
BASOS ABS: 0 {cells}/uL (ref 0–200)
Basophils Relative: 0 %
EOS ABS: 74 {cells}/uL (ref 15–500)
Eosinophils Relative: 1 %
HCT: 29 % — ABNORMAL LOW (ref 35.0–45.0)
Hemoglobin: 9.5 g/dL — ABNORMAL LOW (ref 11.7–15.5)
Lymphocytes Relative: 21 %
Lymphs Abs: 1554 cells/uL (ref 850–3900)
MCH: 26.5 pg — AB (ref 27.0–33.0)
MCHC: 32.8 g/dL (ref 32.0–36.0)
MCV: 80.8 fL (ref 80.0–100.0)
MONOS PCT: 13 %
MPV: 9.7 fL (ref 7.5–12.5)
Monocytes Absolute: 962 cells/uL — ABNORMAL HIGH (ref 200–950)
NEUTROS ABS: 4810 {cells}/uL (ref 1500–7800)
Neutrophils Relative %: 65 %
PLATELETS: 392 10*3/uL (ref 140–400)
RBC: 3.59 MIL/uL — ABNORMAL LOW (ref 3.80–5.10)
RDW: 16.6 % — ABNORMAL HIGH (ref 11.0–15.0)
WBC: 7.4 10*3/uL (ref 3.8–10.8)

## 2015-11-27 LAB — COMPREHENSIVE METABOLIC PANEL
ALBUMIN: 3.3 g/dL — AB (ref 3.6–5.1)
ALT: 9 U/L (ref 6–29)
AST: 11 U/L (ref 10–30)
Alkaline Phosphatase: 67 U/L (ref 33–115)
BILIRUBIN TOTAL: 0.2 mg/dL (ref 0.2–1.2)
BUN: 6 mg/dL — AB (ref 7–25)
CHLORIDE: 105 mmol/L (ref 98–110)
CO2: 28 mmol/L (ref 20–31)
Calcium: 8.7 mg/dL (ref 8.6–10.2)
Creat: 0.75 mg/dL (ref 0.50–1.10)
Glucose, Bld: 88 mg/dL (ref 65–99)
POTASSIUM: 3.8 mmol/L (ref 3.5–5.3)
Sodium: 138 mmol/L (ref 135–146)
TOTAL PROTEIN: 5.7 g/dL — AB (ref 6.1–8.1)

## 2015-11-27 LAB — POCT URINALYSIS DIPSTICK
GLUCOSE UA: NEGATIVE
KETONES UA: NEGATIVE
Leukocytes, UA: NEGATIVE
Nitrite, UA: NEGATIVE
Protein, UA: 30
RBC UA: NEGATIVE
SPEC GRAV UA: 1.025
UROBILINOGEN UA: 0.2
pH, UA: 6

## 2015-11-27 LAB — POCT GLYCOSYLATED HEMOGLOBIN (HGB A1C): Hemoglobin A1C: 5.7

## 2015-11-27 MED ORDER — CYCLOBENZAPRINE HCL 10 MG PO TABS: 10.0000 mg | ORAL_TABLET | Freq: Three times a day (TID) | ORAL | 0 refills | Status: DC | PRN

## 2015-11-27 MED ORDER — CLONAZEPAM 0.5 MG PO TABS: 0.5000 mg | ORAL_TABLET | Freq: Two times a day (BID) | ORAL | 99 refills | Status: DC | PRN

## 2015-11-27 MED ORDER — DICYCLOMINE HCL 20 MG PO TABS: 20.0000 mg | ORAL_TABLET | Freq: Four times a day (QID) | ORAL | 0 refills | Status: DC

## 2015-11-27 MED FILL — clonazePAM 0.5 MG TABS: 0.5 | 15 days supply | Qty: 30 | Fill #0

## 2015-11-27 MED FILL — CYCLOBENZAPRINE 10 MG TAB: 10 | 1 days supply | Qty: 30 | Fill #0

## 2015-11-27 MED FILL — ?DICYCLOMINE 20 MG TABLET: 20 | 10 days supply | Qty: 20 | Fill #0

## 2015-11-27 NOTE — Progress Notes (Signed)
Patient ID: Isabel Evans, female   DOB: February 16, 1971, 45 y.o.   MRN: [id number]   Isabel Evans, is a 45 y.o. female  [id number]  [id number]  DOB - Sep 01, 1970  Subjective:  Chief Complaint and HPI: Isabel Evans is a 45 y.o. female here today for ED f/up for being seen on 11/17/2015 for diarrhea.  At that time, she was having vomiting, headache,  and chills.  Those symptoms have resolved, and although the diarrhea has lessened, she has continued to have loose BMs 3-6 times daily.  She denies any travel or suspicious foods.  No one else in household or sick contacts.  She denies melena/hematochezia.  She is having intermittent lower abdominal pain.  She has taken pepto bismol and immodium with little relief.  No mucus in stool.  Loose, watery, and brown. She is also requesting a RF on flexeril and Clonazepam, and Dr. Doreene Burke is out of town until next week  ED/Hospital notes reviewed.     ROS:   Constitutional:  No f/c, No night sweats, No unexplained weight loss. EENT:  No vision changes, No blurry vision, No hearing changes. No mouth, throat, or ear problems.  Respiratory: No cough, No SOB Cardiac: No CP, no palpitations GI:  +mild lower abd pain, No N/V. +D. GU: No Urinary s/sx Musculoskeletal: No joint pain Neuro: No headache, no dizziness, no motor weakness.  Skin: No rash Endocrine:  No polydipsia. No polyuria.  Psych: Denies SI/HI  No problems updated.  ALLERGIES: No Known Allergies  PAST MEDICAL HISTORY: Past Medical History:  Diagnosis Date  . Bone spur    cervical spine  . Deviated nasal septum    states is unable to lie flat, because her nose will become congested and she can stop breathing  . Fibromyalgia   . GERD (gastroesophageal reflux disease)   . Hallux abductovalgus with bunions 09/2014   right   . Hammertoe 09/2014   right 4th, 5th  . Hypertension    states is under control with med., has been on med. x 8 mos.  . IBS (irritable bowel syndrome)    no current med.  . Osteoarthritis    hands  . Sinus headache     MEDICATIONS AT HOME: Prior to Admission medications   Medication Sig Start Date End Date Taking? Authorizing Provider  amLODipine (NORVASC) 10 MG tablet TAKE 1 TABLET BY MOUTH ONCE DAILY Patient taking differently: TAKE 10 MG BY MOUTH ONCE DAILY 02/25/15  Yes Tresa Garter, MD  cyclobenzaprine (FLEXERIL) 10 MG tablet Take 1 tablet (10 mg total) by mouth 3 (three) times daily as needed for muscle spasms. 11/27/15  Yes Dionne Bucy McClung, PA-C  DULoxetine (CYMBALTA) 30 MG capsule TAKE 1 CAPSULE BY MOUTH 2 TIMES DAILY Patient taking differently: TAKE 30 MG BY MOUTH 2 TIMES DAILY 10/06/15  Yes Tresa Garter, MD  gabapentin (NEURONTIN) 300 MG capsule Take 1 capsule (300 mg total) by mouth 3 (three) times daily. 10/08/15  Yes Arnoldo Morale, MD  ondansetron (ZOFRAN ODT) 8 MG disintegrating tablet Take 1 tablet (8 mg total) by mouth every 8 (eight) hours as needed for nausea or vomiting. 11/17/15  Yes Jola Schmidt, MD  clonazePAM (KLONOPIN) 0.5 MG tablet Take 1 tablet (0.5 mg total) by mouth 2 (two) times daily as needed. for anxiety 11/27/15   Argentina Donovan, PA-C  dicyclomine (BENTYL) 20 MG tablet Take 1 tablet (20 mg total) by mouth every 6 (six) hours. Prn abdominal cramping 11/27/15   Levada Dy  [place]ers, PA-C     Objective:  EXAM:   Vitals:   11/27/15 1411  BP: 112/76  Pulse: 73  Resp: 16  Temp: 98 F (36.7 C)  TempSrc: Oral  SpO2: 99%  Weight: 179 lb 9.6 oz (81.5 kg)    General appearance : A&OX3. NAD. Non-toxic-appearing HEENT: Atraumatic and Normocephalic.  PERRLA. EOM intact.  TM clear B. Mouth-MMM, post pharynx WNL w/o erythema, No PND. Neck: supple, no JVD. No cervical lymphadenopathy. No thyromegaly Chest/Lungs:  Breathing-non-labored, Good air entry bilaterally, breath sounds normal without rales, rhonchi, or wheezing  CVS: S1 S2 regular, no murmurs, gallops, rubs  Abdomen: Bowel sounds present, Non  tender and not distended with no gaurding, rigidity or rebound. Extremities: Bilateral Lower Ext shows no edema, both legs are warm to touch with = pulse throughout Neurology:  CN II-XII grossly intact, Non focal.   Psych:  TP linear. J/I WNL. Normal speech. Appropriate eye contact and affect.  Skin:  No Rash  Data Review Lab Results  Component Value Date   HGBA1C 5.7 11/27/2015     Assessment & Plan   1. Diarrhea, unspecified type Adequate hydration imperative.  Consider stool cultures if this doesn't improve over the next few days. Recheck electrolytes and CBC.  - Comprehensive metabolic panel - CBC with Differential/Platelet Bentyl ordered.  Hopefully diarrhea and cramping will improve/resolve.   2. Leukocytosis - POCT urinalysis dipstick - Comprehensive metabolic panel - CBC with Differential/Platelet  3. Hyperglycemia - POCT glycosylated hemoglobin (Hb A1C) A1C was normal  I gave her ~10-14 day supply on flexeril and Clonazepam until she can check with Dr. Gunnar Fusi on RF.   Patient have been counseled extensively about nutrition and exercise  F/up predicated on labs or in 3 weeks with Dr. Doreene Burke.   The patient was given clear instructions to go to ER or return to medical center if symptoms don't improve, worsen or new problems develop. The patient verbalized understanding. The patient was told to call to get lab results if they haven't heard anything in the next week.     Freeman Caldron, PA-C [facility], [place]   11/27/2015, 4:50 PM

## 2015-11-27 NOTE — Progress Notes (Signed)
Pt is in the office today for hospital f/u. Pt states her pain level today in the office is a 2 Pt states the pain is coming from her abdomen and back Pt states she would like her flonase refilled with other medications as well if possible

## 2015-11-28 ENCOUNTER — Other Ambulatory Visit: Payer: Self-pay | Admitting: Family Medicine

## 2015-11-28 ENCOUNTER — Other Ambulatory Visit: Payer: Self-pay | Admitting: Physician Assistant

## 2015-11-28 NOTE — Addendum Note (Signed)
Addended by: Argentina Donovan on: 11/28/2015 03:57 PM   Modules accepted: Orders

## 2015-11-29 ENCOUNTER — Other Ambulatory Visit: Payer: Self-pay | Admitting: Physician Assistant

## 2015-11-29 ENCOUNTER — Other Ambulatory Visit: Payer: Self-pay | Admitting: Family Medicine

## 2015-11-30 LAB — IRON,TIBC AND FERRITIN PANEL
%SAT: 5 % — ABNORMAL LOW (ref 11–50)
FERRITIN: 17 ng/mL (ref 10–232)
Iron: 18 ug/dL — ABNORMAL LOW (ref 40–190)
TIBC: 364 ug/dL (ref 250–450)

## 2015-12-02 ENCOUNTER — Encounter: Payer: Self-pay | Admitting: Internal Medicine

## 2015-12-03 ENCOUNTER — Other Ambulatory Visit: Payer: Self-pay | Admitting: Internal Medicine

## 2015-12-03 ENCOUNTER — Telehealth: Payer: Self-pay

## 2015-12-03 MED FILL — DULoxetine HCL 30 MG CPEP: 30 | 30 days supply | Qty: 60 | Fill #1

## 2015-12-03 MED FILL — GABAPENTIN 300 MG CAPSULE: 300 | 30 days supply | Qty: 90 | Fill #5

## 2015-12-03 MED FILL — ?OMEPRAZOLE DR 20 MG CAPSUL: 20 | 30 days supply | Qty: 60 | Fill #0

## 2015-12-03 MED FILL — AMLODIPINE BESYLATE 10 MG T: 10 | 30 days supply | Qty: 30 | Fill #6

## 2015-12-03 NOTE — Telephone Encounter (Signed)
Contacted pt to go over lab results pt is aware of results and doesn't have any questions or concerns 

## 2015-12-15 ENCOUNTER — Other Ambulatory Visit: Payer: Self-pay | Admitting: Internal Medicine

## 2015-12-18 MED FILL — clonazePAM 0.5 MG TABS: 0.5 | 15 days supply | Qty: 30 | Fill #1

## 2015-12-23 MED FILL — ?CYCLOBENZAPRINE 10 MG TABL: 10 | 10 days supply | Qty: 30 | Fill #0

## 2016-01-01 ENCOUNTER — Ambulatory Visit: Payer: Self-pay | Attending: Internal Medicine | Admitting: Internal Medicine

## 2016-01-01 ENCOUNTER — Encounter: Payer: Self-pay | Admitting: Internal Medicine

## 2016-01-01 VITALS — BP 120/77 | HR 89 | Temp 98.1°F | Resp 18 | Ht 69.0 in | Wt 181.6 lb

## 2016-01-01 DIAGNOSIS — I10 Essential (primary) hypertension: Secondary | ICD-10-CM | POA: Insufficient documentation

## 2016-01-01 DIAGNOSIS — K219 Gastro-esophageal reflux disease without esophagitis: Secondary | ICD-10-CM | POA: Insufficient documentation

## 2016-01-01 DIAGNOSIS — F329 Major depressive disorder, single episode, unspecified: Secondary | ICD-10-CM | POA: Insufficient documentation

## 2016-01-01 DIAGNOSIS — F411 Generalized anxiety disorder: Secondary | ICD-10-CM | POA: Insufficient documentation

## 2016-01-01 DIAGNOSIS — K589 Irritable bowel syndrome without diarrhea: Secondary | ICD-10-CM | POA: Insufficient documentation

## 2016-01-01 DIAGNOSIS — M797 Fibromyalgia: Secondary | ICD-10-CM | POA: Insufficient documentation

## 2016-01-01 DIAGNOSIS — Z23 Encounter for immunization: Secondary | ICD-10-CM | POA: Insufficient documentation

## 2016-01-01 DIAGNOSIS — M1990 Unspecified osteoarthritis, unspecified site: Secondary | ICD-10-CM | POA: Insufficient documentation

## 2016-01-01 DIAGNOSIS — K047 Periapical abscess without sinus: Secondary | ICD-10-CM | POA: Insufficient documentation

## 2016-01-01 DIAGNOSIS — Z79899 Other long term (current) drug therapy: Secondary | ICD-10-CM | POA: Insufficient documentation

## 2016-01-01 DIAGNOSIS — Z0000 Encounter for general adult medical examination without abnormal findings: Secondary | ICD-10-CM

## 2016-01-01 MED ORDER — GABAPENTIN 300 MG PO CAPS: 300.0000 mg | ORAL_CAPSULE | Freq: Three times a day (TID) | ORAL | 0 refills | Status: DC

## 2016-01-01 MED ORDER — CYCLOBENZAPRINE HCL 10 MG PO TABS: 10.0000 mg | ORAL_TABLET | Freq: Three times a day (TID) | ORAL | 3 refills | Status: DC | PRN

## 2016-01-01 MED ORDER — PENICILLIN V POTASSIUM 500 MG PO TABS: 500.0000 mg | ORAL_TABLET | Freq: Four times a day (QID) | ORAL | 0 refills | Status: AC

## 2016-01-01 MED ORDER — FLUTICASONE PROPIONATE 50 MCG/ACT NA SUSP: 2.0000 | Freq: Every day | NASAL | 3 refills | Status: DC

## 2016-01-01 MED ORDER — OMEPRAZOLE 20 MG PO CPDR: 40.0000 mg | DELAYED_RELEASE_CAPSULE | Freq: Every day | ORAL | 3 refills | Status: DC

## 2016-01-01 MED ORDER — CLONAZEPAM 0.5 MG PO TABS: 0.5000 mg | ORAL_TABLET | Freq: Two times a day (BID) | ORAL | 0 refills | Status: DC | PRN

## 2016-01-01 MED FILL — clonazePAM 0.5 MG TABS: 0.5 | 30 days supply | Qty: 60 | Fill #0

## 2016-01-01 MED FILL — ?CYCLOBENZAPRINE 10 MG TABL: 10 | 20 days supply | Qty: 60 | Fill #0

## 2016-01-01 MED FILL — PENICILLIN VK 500 MG TABLET: 500 | 7 days supply | Qty: 28 | Fill #0

## 2016-01-01 NOTE — Patient Instructions (Addendum)
Generalized Anxiety Disorder Generalized anxiety disorder (GAD) is a mental disorder. It interferes with life functions, including relationships, work, and school. GAD is different from normal anxiety, which everyone experiences at some point in their lives in response to specific life events and activities. Normal anxiety actually helps [place] prepare for and get through these life events and activities. Normal anxiety goes away after the event or activity is over.  GAD causes anxiety that is not necessarily related to specific events or activities. It also causes excess anxiety in proportion to specific events or activities. The anxiety associated with GAD is also difficult to control. GAD can vary from mild to severe. People with severe GAD can have intense waves of anxiety with physical symptoms (panic attacks).  SYMPTOMS The anxiety and worry associated with GAD are difficult to control. This anxiety and worry are related to many life events and activities and also occur more days than not for 6 months or longer. People with GAD also have three or more of the following symptoms (one or more in children):  Restlessness.   Fatigue.  Difficulty concentrating.   Irritability.  Muscle tension.  Difficulty sleeping or unsatisfying sleep. DIAGNOSIS GAD is diagnosed through an assessment by your health care provider. Your health care provider will ask you questions aboutyour mood,physical symptoms, and events in your life. Your health care provider may ask you about your medical history and use of alcohol or drugs, including prescription medicines. Your health care provider may also do a physical exam and blood tests. Certain medical conditions and the use of certain substances can cause symptoms similar to those associated with GAD. Your health care provider may refer you to a mental health specialist for further evaluation. TREATMENT The following therapies are usually used to treat GAD:    Medication. Antidepressant medication usually is prescribed for long-term daily control. Antianxiety medicines may be added in severe cases, especially when panic attacks occur.   Talk therapy (psychotherapy). Certain types of talk therapy can be helpful in treating GAD by providing support, education, and guidance. A form of talk therapy called cognitive behavioral therapy can teach you healthy ways to think about and react to daily life events and activities.  Stress managementtechniques. These include yoga, meditation, and exercise and can be very helpful when they are practiced regularly. A mental health specialist can help determine which treatment is best for you. Some people see improvement with one therapy. However, other people require a combination of therapies.   This information is not intended to replace advice given to you by your health care provider. Make sure you discuss any questions you have with your health care provider.   Document Released: 07/31/2012 Document Revised: 04/26/2014 Document Reviewed: 07/31/2012 Elsevier Interactive Patient Education 2016 [place] [employer] Dental caries (also called tooth decay) is the most common oral disease. It can occur at any age but is more common in children and young adults.  HOW DENTAL CARIES DEVELOPS  The process of decay begins when bacteria and foods (particularly sugars and starches) combine in your mouth to produce plaque. Plaque is a substance that sticks to the hard, outer surface of a tooth (enamel). The bacteria in plaque produce acids that attack enamel. These acids may also attack the root surface of a tooth (cementum) if it is exposed. Repeated attacks dissolve these surfaces and create holes in the tooth (cavities). If left untreated, the acids destroy the other layers of the tooth.  RISK FACTORS  Frequent sipping  of sugary beverages.   Frequent snacking on sugary and starchy foods, especially those that  easily get stuck in the teeth.   Poor oral hygiene.   Dry mouth.   Substance abuse such as methamphetamine abuse.   Broken or poor-fitting dental restorations.   Eating disorders.   Gastroesophageal reflux disease (GERD).   Certain radiation treatments to the head and neck. SYMPTOMS In the early stages of dental caries, symptoms are seldom present. Sometimes white, chalky areas may be seen on the enamel or other tooth layers. In later stages, symptoms may include:  Pits and holes on the enamel.  Toothache after sweet, hot, or cold foods or drinks are consumed.  Pain around the tooth.  Swelling around the tooth. DIAGNOSIS  Most of the time, dental caries is detected during a regular dental checkup. A diagnosis is made after a thorough medical and dental history is taken and the surfaces of your teeth are checked for signs of dental caries. Sometimes special instruments, such as lasers, are used to check for dental caries. Dental X-ray exams may be taken so that areas not visible to the eye (such as between the contact areas of the teeth) can be checked for cavities.  TREATMENT  If dental caries is in its early stages, it may be reversed with a fluoride treatment or an application of a remineralizing agent at the dental office. Thorough brushing and flossing at home is needed to aid these treatments. If it is in its later stages, treatment depends on the location and extent of tooth destruction:   If a small area of the tooth has been destroyed, the destroyed area will be removed and cavities will be filled with a material such as gold, silver amalgam, or composite resin.   If a large area of the tooth has been destroyed, the destroyed area will be removed and a cap (crown) will be fitted over the remaining tooth structure.   If the center part of the tooth (pulp) is affected, a procedure called a root canal will be needed before a filling or crown can be placed.   If most  of the tooth has been destroyed, the tooth may need to be pulled (extracted). HOME CARE INSTRUCTIONS You can prevent, stop, or reverse dental caries at home by practicing good oral hygiene. Good oral hygiene includes:  Thoroughly cleaning your teeth at least twice a day with a toothbrush and dental floss.   Using a fluoride toothpaste. A fluoride mouth rinse may also be used if recommended by your dentist or health care provider.   Restricting the amount of sugary and starchy foods and sugary liquids you consume.   Avoiding frequent snacking on these foods and sipping of these liquids.   Keeping regular visits with a dentist for checkups and cleanings. PREVENTION   Practice good oral hygiene.  Consider a dental sealant. A dental sealant is a coating material that is applied by your dentist to the pits and grooves of teeth. The sealant prevents food from being trapped in them. It may protect the teeth for several years.  Ask about fluoride supplements if you live in a community without fluorinated water or with water that has a low fluoride content. Use fluoride supplements as directed by your dentist or health care provider.  Allow fluoride varnish applications to teeth if directed by your dentist or health care provider.   This information is not intended to replace advice given to you by your health care provider. Make  sure you discuss any questions you have with your health care provider.   Document Released: 12/26/2001 Document Revised: 04/26/2014 Document Reviewed: 04/07/2012 Elsevier Interactive Patient Education [employer].

## 2016-01-01 NOTE — Progress Notes (Signed)
Patient is here for Med Refill  Patient complains of tooth pain being present for the past 2 weeks. Pain increased over the past 3 days. Patient states the pain is a constant throb.  Patient has taken medication today and patient has not eaten today.  Patient would like the flu injection today. Patient tolerated flu injection well.

## 2016-01-01 NOTE — Progress Notes (Signed)
Isabel Evans, is a 45 y.o. female  [id number]  [id number]  DOB - 12-14-1970  Chief Complaint  Patient presents with  . Medication Refill      Subjective:   Isabel Evans is a 45 y.o. female with history of irritable bowel syndrome, fibromyalgia, major depression and anxiety, hypertension and GERD here today for a follow up visit and medication refill. Patient is complaining of tooth aches and pain for the past week, she has a dental appointment in 2 weeks but the pain is too much to bear. She has no fever, no facial swelling, no trismus. Patient continues to have major anxiety and depression, needs refill of her medications because they help. She denies any suicidal ideations or thought. She is compliant with her medications, reports no side effect. Patient has No headache, No chest pain, No abdominal pain - No Nausea, No new weakness tingling or numbness, No Cough - SOB. She needs refill of all her medications.  Problem  Generalized Anxiety Disorder  Dental Abscess   ALLERGIES: No Known Allergies  PAST MEDICAL HISTORY: Past Medical History:  Diagnosis Date  . Bone spur    cervical spine  . Deviated nasal septum    states is unable to lie flat, because her nose will become congested and she can stop breathing  . Fibromyalgia   . GERD (gastroesophageal reflux disease)   . Hallux abductovalgus with bunions 09/2014   right   . Hammertoe 09/2014   right 4th, 5th  . Hypertension    states is under control with med., has been on med. x 8 mos.  . IBS (irritable bowel syndrome)    no current med.  . Osteoarthritis    hands  . Sinus headache    MEDICATIONS AT HOME: Prior to Admission medications   Medication Sig Start Date End Date Taking? Authorizing Provider  amLODipine (NORVASC) 10 MG tablet TAKE 1 TABLET BY MOUTH ONCE DAILY Patient taking differently: TAKE 10 MG BY MOUTH ONCE DAILY 02/25/15  Yes Tresa Garter, MD  clonazePAM (KLONOPIN) 0.5 MG tablet Take 1  tablet (0.5 mg total) by mouth 2 (two) times daily as needed. for anxiety 01/01/16  Yes Tresa Garter, MD  cyclobenzaprine (FLEXERIL) 10 MG tablet Take 1 tablet (10 mg total) by mouth 3 (three) times daily as needed. for muscle spams 01/01/16  Yes Ladine Kiper E Doreene Burke, MD  DULoxetine (CYMBALTA) 30 MG capsule TAKE 1 CAPSULE BY MOUTH 2 TIMES DAILY Patient taking differently: TAKE 30 MG BY MOUTH 2 TIMES DAILY 10/06/15  Yes Tresa Garter, MD  gabapentin (NEURONTIN) 300 MG capsule Take 1 capsule (300 mg total) by mouth 3 (three) times daily. 01/01/16  Yes Tresa Garter, MD  omeprazole (PRILOSEC) 20 MG capsule Take 2 capsules (40 mg total) by mouth daily. 01/01/16  Yes Tresa Garter, MD  dicyclomine (BENTYL) 20 MG tablet Take 1 tablet (20 mg total) by mouth every 6 (six) hours. Prn abdominal cramping Patient not taking: Reported on 01/01/2016 11/27/15   Argentina Donovan, PA-C  fluticasone [facility]) 50 MCG/ACT nasal spray Place 2 sprays into both nostrils daily. 01/01/16   Tresa Garter, MD  ondansetron (ZOFRAN ODT) 8 MG disintegrating tablet Take 1 tablet (8 mg total) by mouth every 8 (eight) hours as needed for nausea or vomiting. Patient not taking: Reported on 01/01/2016 11/17/15   Jola Schmidt, MD  penicillin v potassium (VEETID) 500 MG tablet Take 1 tablet (500 mg total) by mouth 4 (four) times  daily. 01/01/16 01/08/16  Tresa Garter, MD   Objective:   Vitals:   01/01/16 1132  BP: 120/77  Pulse: 89  Resp: 18  Temp: 98.1 F (36.7 C)  TempSrc: Oral  SpO2: 100%  Weight: 181 lb 9.6 oz (82.4 kg)  Height: 5\' 9"  (1.753 m)   Exam General appearance : Awake, alert, not in any distress. Speech Clear. Not toxic looking, poor dentition HEENT: Atraumatic and Normocephalic, pupils equally reactive to light and accomodation Neck: Supple, no JVD. No cervical lymphadenopathy.  Chest: Good air entry bilaterally, no added sounds  CVS: S1 S2 regular, no murmurs.  Abdomen: Bowel  sounds present, Non tender and not distended with no gaurding, rigidity or rebound. Extremities: B/L Lower Ext shows no edema, both legs are warm to touch Neurology: Awake alert, and oriented X 3, CN II-XII intact, Non focal Skin: No Rash  Data Review Lab Results  Component Value Date   HGBA1C 5.7 11/27/2015   Assessment & Plan   1. Healthcare maintenance  - Flu Vaccine QUAD 36+ mos PF IM (Fluarix & Fluzone Quad PF)  2. Essential hypertension  We have discussed target BP range and blood pressure goal. I have advised patient to check BP regularly and to call us back or report to clinic if the numbers are consistently higher than 140/90. We discussed the importance of compliance with medical therapy and DASH diet recommended, consequences of uncontrolled hypertension discussed.  - continue current BP medications  3. Generalized anxiety disorder  - clonazePAM (KLONOPIN) 0.5 MG tablet; Take 1 tablet (0.5 mg total) by mouth 2 (two) times daily as needed. for anxiety  Dispense: 60 tablet; Refill: 0 - fluticasone (FLONASE) 50 MCG/ACT nasal spray; Place 2 sprays into both nostrils daily.  Dispense: 16 g; Refill: 3 4. Dental abscess  - penicillin v potassium (VEETID) 500 MG tablet; Take 1 tablet (500 mg total) by mouth 4 (four) times daily.  Dispense: 28 tablet; Refill: 0  5. Gastroesophageal reflux disease without esophagitis  - omeprazole (PRILOSEC) 20 MG capsule; Take 2 capsules (40 mg total) by mouth daily.  Dispense: 180 capsule; Refill: 3  6. Fibromyalgia  - gabapentin (NEURONTIN) 300 MG capsule; Take 1 capsule (300 mg total) by mouth 3 (three) times daily.  Dispense: 270 capsule; Refill: 0 - cyclobenzaprine (FLEXERIL) 10 MG tablet; Take 1 tablet (10 mg total) by mouth 3 (three) times daily as needed. for muscle spams  Dispense: 60 tablet; Refill: 3  Patient have been counseled extensively about nutrition and exercise  Return in about 3 months (around 04/01/2016) for Follow up  HTN, Depression and Anxiety.  The patient was given clear instructions to go to ER or return to medical center if symptoms don't improve, worsen or new problems develop. The patient verbalized understanding. The patient was told to call to get lab results if they haven't heard anything in the next week.   This note has been created with Surveyor, quantity. Any transcriptional errors are unintentional.    Angelica Chessman, MD, [place], Karilyn Cota, [place] [facility] [place], [place]   01/01/2016, 11:54 AM

## 2016-01-13 MED FILL — ?OMEPRAZOLE DR 20 MG CAPSUL: 20 | 30 days supply | Qty: 60 | Fill #0

## 2016-01-13 MED FILL — GABAPENTIN 300 MG CAPSULE: 300 | 30 days supply | Qty: 90 | Fill #0

## 2016-01-13 MED FILL — AMLODIPINE BESYLATE 10 MG T: 10 | 30 days supply | Qty: 30 | Fill #7

## 2016-01-13 MED FILL — DULoxetine HCL 30 MG CPEP: 30 | 30 days supply | Qty: 60 | Fill #2

## 2016-01-28 ENCOUNTER — Encounter: Payer: Self-pay | Admitting: Internal Medicine

## 2016-01-28 ENCOUNTER — Other Ambulatory Visit: Payer: Self-pay | Admitting: Internal Medicine

## 2016-01-28 DIAGNOSIS — F411 Generalized anxiety disorder: Secondary | ICD-10-CM

## 2016-01-29 ENCOUNTER — Other Ambulatory Visit: Payer: Self-pay | Admitting: Internal Medicine

## 2016-01-29 DIAGNOSIS — F411 Generalized anxiety disorder: Secondary | ICD-10-CM

## 2016-01-29 MED ORDER — CLONAZEPAM 0.5 MG PO TABS: 0.5000 mg | ORAL_TABLET | Freq: Two times a day (BID) | ORAL | 0 refills | Status: DC | PRN

## 2016-01-29 MED FILL — clonazePAM 0.5 MG TABS: 0.5 | 15 days supply | Qty: 30 | Fill #2

## 2016-01-29 NOTE — Telephone Encounter (Signed)
Patient was last seen on 01/01/16 and last filled on 01/01/16.

## 2016-02-17 ENCOUNTER — Encounter (HOSPITAL_COMMUNITY): Payer: Self-pay | Admitting: Emergency Medicine

## 2016-02-17 ENCOUNTER — Emergency Department (HOSPITAL_COMMUNITY)
Admission: EM | Admit: 2016-02-17 | Discharge: 2016-02-17 | Disposition: A | Discharge status: Home / Self Care | Payer: Self-pay | Attending: Emergency Medicine | Admitting: Emergency Medicine

## 2016-02-17 DIAGNOSIS — Z87891 Personal history of nicotine dependence: Secondary | ICD-10-CM | POA: Insufficient documentation

## 2016-02-17 DIAGNOSIS — Z79899 Other long term (current) drug therapy: Secondary | ICD-10-CM | POA: Insufficient documentation

## 2016-02-17 DIAGNOSIS — I10 Essential (primary) hypertension: Secondary | ICD-10-CM | POA: Insufficient documentation

## 2016-02-17 DIAGNOSIS — N390 Urinary tract infection, site not specified: Secondary | ICD-10-CM | POA: Insufficient documentation

## 2016-02-17 LAB — URINALYSIS, ROUTINE W REFLEX MICROSCOPIC
Bilirubin Urine: NEGATIVE
Glucose, UA: NEGATIVE mg/dL
Ketones, ur: NEGATIVE mg/dL
Nitrite: POSITIVE — AB
PROTEIN: 30 mg/dL — AB
Specific Gravity, Urine: 1.024 (ref 1.005–1.030)
pH: 6.5 (ref 5.0–8.0)

## 2016-02-17 LAB — URINE MICROSCOPIC-ADD ON

## 2016-02-17 LAB — CBC WITH DIFFERENTIAL/PLATELET
BASOS PCT: 0 %
Basophils Absolute: 0 10*3/uL (ref 0.0–0.1)
Eosinophils Absolute: 0.1 10*3/uL (ref 0.0–0.7)
Eosinophils Relative: 2 %
HEMATOCRIT: 39.3 % (ref 36.0–46.0)
HEMOGLOBIN: 13.1 g/dL (ref 12.0–15.0)
LYMPHS PCT: 36 %
Lymphs Abs: 3 10*3/uL (ref 0.7–4.0)
MCH: 28.8 pg (ref 26.0–34.0)
MCHC: 33.3 g/dL (ref 30.0–36.0)
MCV: 86.4 fL (ref 78.0–100.0)
Monocytes Absolute: 0.7 10*3/uL (ref 0.1–1.0)
Monocytes Relative: 8 %
NEUTROS ABS: 4.6 10*3/uL (ref 1.7–7.7)
NEUTROS PCT: 54 %
Platelets: 300 10*3/uL (ref 150–400)
RBC: 4.55 MIL/uL (ref 3.87–5.11)
RDW: 14.9 % (ref 11.5–15.5)
WBC: 8.3 10*3/uL (ref 4.0–10.5)

## 2016-02-17 LAB — PREGNANCY, URINE: PREG TEST UR: NEGATIVE

## 2016-02-17 MED ORDER — ONDANSETRON HCL 4 MG PO TABS: 4.0000 mg | ORAL_TABLET | Freq: Four times a day (QID) | ORAL | 0 refills | Status: DC

## 2016-02-17 MED ORDER — CEFTRIAXONE SODIUM 1 G IJ SOLR
1.0000 g | Freq: Once | INTRAMUSCULAR | Status: AC
  Administered 2016-02-17: 1 g via INTRAVENOUS
  Filled 2016-02-17: qty 10

## 2016-02-17 MED ORDER — TRAMADOL HCL 50 MG PO TABS: 50.0000 mg | ORAL_TABLET | Freq: Four times a day (QID) | ORAL | 0 refills | Status: DC | PRN

## 2016-02-17 MED ORDER — HYDROCODONE-ACETAMINOPHEN 5-325 MG PO TABS
1.0000 | ORAL_TABLET | Freq: Once | ORAL | Status: AC
  Administered 2016-02-17: 1 via ORAL
  Filled 2016-02-17: qty 1

## 2016-02-17 MED ORDER — PHENAZOPYRIDINE HCL 200 MG PO TABS: 200.0000 mg | ORAL_TABLET | Freq: Three times a day (TID) | ORAL | 0 refills | Status: DC

## 2016-02-17 MED ORDER — CEPHALEXIN 500 MG PO CAPS: 500.0000 mg | ORAL_CAPSULE | Freq: Four times a day (QID) | ORAL | 0 refills | Status: DC

## 2016-02-17 MED ORDER — SODIUM CHLORIDE 0.9 % IV SOLN
INTRAVENOUS | Status: AC
  Administered 2016-02-17: 19:00:00 via INTRAVENOUS

## 2016-02-17 MED ORDER — ONDANSETRON 4 MG PO TBDP
4.0000 mg | ORAL_TABLET | Freq: Once | ORAL | Status: AC
  Administered 2016-02-17: 4 mg via ORAL
  Filled 2016-02-17: qty 1

## 2016-02-17 MED ORDER — PHENAZOPYRIDINE HCL 100 MG PO TABS
200.0000 mg | ORAL_TABLET | Freq: Once | ORAL | Status: AC
  Administered 2016-02-17: 200 mg via ORAL
  Filled 2016-02-17: qty 2

## 2016-02-17 NOTE — ED Notes (Signed)
Pt c/o urinary frequency and unable to hold her urine, also c/o lower back pain.  Onset of back pain yesterday   Pt also c/o nausea

## 2016-02-17 NOTE — Discharge Instructions (Signed)
We have sent the urine for culture. If we need to change your medication someone will call you.

## 2016-02-17 NOTE — ED Triage Notes (Signed)
Pt states she started having burning with urination and frequency for a week. Pt has started having back pain and feeling nauseated.

## 2016-02-17 NOTE — ED Provider Notes (Signed)
[facility] DEPT Provider Note   CSN: [id number] Arrival date & time: 02/17/16  1623  By signing my name below, I, Dora Sims, attest that this documentation has been prepared under the direction and in the presence of Debroah Baller, NP. Electronically Signed: Dora Sims, Scribe. 02/17/2016. 5:21 PM.  History   Chief Complaint Chief Complaint  Patient presents with  . Urinary Frequency    The history is provided by the patient. No language interpreter was used.  Urinary Frequency  This is a new problem. The current episode started more than 2 days ago. The problem occurs constantly. The problem has not changed since onset.Associated symptoms include abdominal pain (periumbilical and suprapubic). Pertinent negatives include no chest pain, no headaches and no shortness of breath. Nothing aggravates the symptoms. Nothing relieves the symptoms. She has tried nothing for the symptoms.     HPI Comments: Isabel Evans is a 45 y.o. female who presents to the Emergency Department complaining of multiple urinary symptoms over the last week. She states she experienced dysuria a week ago that lasted for a few days, but this has resolved. She reports she is currently having urinary urgency/frequency, malodorous urine, and intermittent polyuria. She also notes some nausea, abdominal pain, and lower back pain. She states her lower back pain is worse with movement. Pt notes she has urinated on herself due to urgency a couple of times since onset. She states it feels like she has to urinate more even after she finishes. She notes h/o UTI and a PSHx of tubal ligation. Pt denies recent new sexual partners. She further denies vaginal discharge, vaginal bleeding, vomiting, hematuria, or any other associated symptoms.  Past Medical History:  Diagnosis Date  . Bone spur    cervical spine  . Deviated nasal septum    states is unable to lie flat, because her nose will become congested and she can stop  breathing  . Fibromyalgia   . GERD (gastroesophageal reflux disease)   . Hallux abductovalgus with bunions 09/2014   right   . Hammertoe 09/2014   right 4th, 5th  . Hypertension    states is under control with med., has been on med. x 8 mos.  . IBS (irritable bowel syndrome)    no current med.  . Osteoarthritis    hands  . Sinus headache     Patient Active Problem List   Diagnosis Date Noted  . Generalized anxiety disorder 01/01/2016  . Dental abscess 01/01/2016  . Healthcare maintenance 05/08/2015  . Adjustment disorder with mixed anxiety and depressed mood 05/08/2015  . Stress at home 10/24/2014  . Irritable bowel syndrome 10/24/2014  . Anxiety state 10/24/2014  . Preop testing 09/19/2014  . Pap smear for cervical cancer screening 09/19/2014  . Midline low back pain without sciatica 08/12/2014  . [place] nodes 08/12/2014  . Contracture of joint, hand 08/12/2014  . Essential hypertension 02/11/2014  . Allergy 02/11/2014  . Screening for breast cancer 02/11/2014  . LGSIL (low grade squamous intraepithelial dysplasia) 11/23/2013  . Dupuytren's contracture of both hands 11/15/2013  . Hallux valgus of right foot 11/15/2013  . Back pain 05/21/2013  . UTI (urinary tract infection) 05/21/2013  . Multiple skin nodules 11/27/2012  . Fibromyalgia 09/08/2012  . Osteoarthritis 09/08/2012  . Hypertension 07/14/2012  . Neck muscle spasm 07/14/2012  . Plantar wart 07/14/2012  . Insomnia 07/14/2012    Past Surgical History:  Procedure Laterality Date  . BUNIONECTOMY Right 10/04/2014   Procedure:  Alt[place]e [place], TAILOR'S  BUNIONECTOMY;  Surgeon: Trula Slade, DPM;  Location: [facility];  Service: Podiatry;  Laterality: Right;  . COLONOSCOPY WITH PROPOFOL  02/13/2013  . DUPUYTREN / PALMAR FASCIOTOMY Right 07/18/2013   exc. of multiple nodules  . 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 HAMMER TOE SURGERY Right 10/04/2014   Procedure:  HAMMER TOE REPAIR 4TH  AND 5TH  RIGHT FOOT  fourth toe fixation with k wire;  Surgeon: Trula Slade, DPM;  Location: [facility];  Service: Podiatry;  Laterality: Right;  . 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 TUBAL LIGATION      OB History    Gravida Para Term Preterm AB Living   3 3 3  0 0 3   SAB TAB Ectopic Multiple Live Births   0 0 0 0         Home Medications    Prior to Admission medications   Medication Sig Start Date End Date Taking? Authorizing Provider  amLODipine (NORVASC) 10 MG tablet TAKE 1 TABLET BY MOUTH ONCE DAILY Patient taking differently: TAKE 10 MG BY MOUTH ONCE DAILY 02/25/15   Tresa Garter, MD  cephALEXin (KEFLEX) 500 MG capsule Take 1 capsule (500 mg total) by mouth 4 (four) times daily. 02/17/16   Caidyn Henricksen Bunnie Pion, NP  clonazePAM (KLONOPIN) 0.5 MG tablet Take 1 tablet (0.5 mg total) by mouth 2 (two) times daily as needed. for anxiety 01/31/16 03/02/16  Tresa Garter, MD  cyclobenzaprine (FLEXERIL) 10 MG tablet Take 1 tablet (10 mg total) by mouth 3 (three) times daily as needed. for muscle spams 01/01/16   Tresa Garter, MD  dicyclomine (BENTYL) 20 MG tablet Take 1 tablet (20 mg total) by mouth every 6 (six) hours. Prn abdominal cramping Patient not taking: Reported on 01/01/2016 11/27/15   Argentina Donovan, PA-C  DULoxetine (CYMBALTA) 30 MG capsule TAKE 1 CAPSULE BY MOUTH 2 TIMES DAILY Patient taking differently: TAKE 30 MG BY MOUTH 2 TIMES DAILY 10/06/15   Tresa Garter, MD  fluticasone (FLONASE) 50 MCG/ACT nasal spray Place 2 sprays into both nostrils daily. 01/01/16   Tresa Garter, MD  gabapentin (NEURONTIN) 300 MG capsule Take 1 capsule (300 mg total) by mouth 3 (three) times daily. 01/01/16   Tresa Garter, MD  omeprazole (PRILOSEC) 20 MG capsule Take 2 capsules (40 mg total) by mouth daily. 01/01/16   Tresa Garter, MD  ondansetron (ZOFRAN ODT) 8 MG disintegrating tablet Take 1  tablet (8 mg total) by mouth every 8 (eight) hours as needed for nausea or vomiting. Patient not taking: Reported on 01/01/2016 11/17/15   Jola Schmidt, MD  ondansetron (ZOFRAN) 4 MG tablet Take 1 tablet (4 mg total) by mouth every 6 (six) hours. 02/17/16   Keyanna Sandefer Bunnie Pion, NP  phenazopyridine (PYRIDIUM) 200 MG tablet Take 1 tablet (200 mg total) by mouth 3 (three) times daily. 02/17/16   Alayne Estrella Bunnie Pion, NP  traMADol (ULTRAM) 50 MG tablet Take 1 tablet (50 mg total) by mouth every 6 (six) hours as needed. 02/17/16   Lyle Niblett Bunnie Pion, NP    Family History Family History  Problem Relation Age of Onset  . Hypertension Mother   . Diabetes Mother   . Heart disease Mother   . Hypertension Father   . Diabetes Father   . Prostate cancer Father   . Heart disease Father   . Alcohol  abuse Father   . Breast cancer Sister   . Hypertension Sister   . Diabetes Sister   . Heart disease Maternal Grandmother     Great GM  . Breast cancer Maternal Grandmother   . Bone cancer Maternal Grandmother   . Breast cancer Maternal Aunt   . Ovarian cancer Maternal Aunt   . Colon cancer Maternal Aunt     great aunt  . Rheum arthritis Sister   . Colon cancer Son     Social History Social History  Substance Use Topics  . Smoking status: Former Smoker    Quit date: 08/24/2012  . Smokeless tobacco: Never Used  . Alcohol use Yes     Comment: occasionally     Allergies   Review of patient's allergies indicates no known allergies.   Review of Systems Review of Systems  Respiratory: Negative for shortness of breath.   Cardiovascular: Negative for chest pain.  Gastrointestinal: Positive for abdominal pain (periumbilical and suprapubic) and nausea. Negative for vomiting.  Endocrine: Positive for polyuria (intermittent).  Genitourinary: Positive for dysuria (resolved), frequency and urgency. Negative for hematuria, vaginal bleeding and vaginal discharge.  Musculoskeletal: Positive for back pain (lower).    Neurological: Negative for syncope and headaches.  Psychiatric/Behavioral: Negative for confusion.  All other systems reviewed and are negative.   Physical Exam Updated Vital Signs BP 113/73 (BP Location: Right Arm)   Pulse 61   Temp 98.5 F (36.9 C) (Oral)   Resp 20   Ht 5\' 11"  (1.803 m)   Wt 81.6 kg   LMP 02/03/2016   SpO2 100%   BMI 25.10 kg/m   Physical Exam  Constitutional: She is oriented to person, place, and time. She appears well-developed and well-nourished. No distress.  HENT:  Head: Normocephalic and atraumatic.  Eyes: Conjunctivae and EOM are normal.  Neck: Neck supple. No tracheal deviation present.  Cardiovascular: Normal rate and regular rhythm.   Pulmonary/Chest: Effort normal. No respiratory distress.  Lungs CTA.  Abdominal: Soft. Bowel sounds are normal. There is no rebound and no guarding.  Tenderness around the umbilicus and suprapubic area. No CVA tenderness.  Musculoskeletal: Normal range of motion.  Increased back pain with movement.  Neurological: She is alert and oriented to person, place, and time.  Skin: Skin is warm and dry.  Psychiatric: She has a normal mood and affect. Her behavior is normal.  Nursing note and vitals reviewed.   ED Treatments / Results  Labs (all labs ordered are listed, but only abnormal results are displayed) Labs Reviewed  URINALYSIS, ROUTINE W REFLEX MICROSCOPIC (NOT AT [facility]) - Abnormal; Notable for the following:       Result Value   APPearance CLOUDY (*)    Hgb urine dipstick SMALL (*)    Protein, ur 30 (*)    Nitrite POSITIVE (*)    Leukocytes, UA SMALL (*)    All other components within normal limits  URINE MICROSCOPIC-ADD ON - Abnormal; Notable for the following:    Squamous Epithelial / LPF TOO NUMEROUS TO COUNT (*)    Bacteria, UA MANY (*)    All other components within normal limits  URINE CULTURE  PREGNANCY, URINE  CBC WITH DIFFERENTIAL/PLATELET   Radiology No results  found.  Procedures Procedures (including critical care time)  DIAGNOSTIC STUDIES: Oxygen Saturation is 99% on RA, normal by my interpretation.    COORDINATION OF CARE: 5:31 PM Discussed treatment plan with pt at bedside and pt agreed to plan.Rocephin 1 gram IV,  Zofran and pain management  @ 6:57 patient reports feeling much better just with the medication for nausea. IV Rocephin infusing.   Medications Ordered in ED Medications  0.9 %  sodium chloride infusion ( Intravenous Stopped 02/17/16 1946)  ondansetron (ZOFRAN-ODT) disintegrating tablet 4 mg (4 mg Oral Given 02/17/16 1742)  HYDROcodone-acetaminophen (NORCO/VICODIN) 5-325 MG per tablet 1 tablet (1 tablet Oral Given 02/17/16 1742)  phenazopyridine (PYRIDIUM) tablet 200 mg (200 mg Oral Given 02/17/16 1741)  cefTRIAXone (ROCEPHIN) 1 g in dextrose 5 % 50 mL IVPB (0 g Intravenous Stopped 02/17/16 1946)     Initial Impression / Assessment and Plan / ED Course  I have reviewed the triage vital signs and the nursing notes.  Pertinent lab results that were available during my care of the patient were reviewed by me and considered in my medical decision making (see chart for details).  Clinical Course    I personally performed the services described in this documentation, which was scribed in my presence. The recorded information has been reviewed and is accurate.   Final Clinical Impressions(s) / ED Diagnoses  45 y.o. female with dysuria, urgency and frequent urination stable for d/c without fever, CVA tenderness and nausea controlled with Zofran. Patient to f/u with her PCP or return here for worsening symptoms.   Final diagnoses:  Lower urinary tract infectious disease    New Prescriptions New Prescriptions   CEPHALEXIN (KEFLEX) 500 MG CAPSULE    Take 1 capsule (500 mg total) by mouth 4 (four) times daily.   ONDANSETRON (ZOFRAN) 4 MG TABLET    Take 1 tablet (4 mg total) by mouth every 6 (six) hours.   PHENAZOPYRIDINE  (PYRIDIUM) 200 MG TABLET    Take 1 tablet (200 mg total) by mouth 3 (three) times daily.   TRAMADOL (ULTRAM) 50 MG TABLET    Take 1 tablet (50 mg total) by mouth every 6 (six) hours as needed.     [place], [place] 02/17/16 2022    Leonard Schwartz, MD 02/28/16 [phone]

## 2016-02-18 MED FILL — ?AMLODIPINE BESYLATE 10 MG: 10 | 30 days supply | Qty: 30 | Fill #8

## 2016-02-18 MED FILL — DULoxetine HCL 30 MG CPEP: 30 | 30 days supply | Qty: 60 | Fill #3

## 2016-02-18 MED FILL — traMADol HCL 50 MG TABS: 50 | 3 days supply | Qty: 15 | Fill #0

## 2016-02-18 MED FILL — GABAPENTIN 300 MG CAPSULE: 300 | 30 days supply | Qty: 90 | Fill #1

## 2016-02-18 MED FILL — FLUTICASONE PROP 50 MCG SPR: 50 | 30 days supply | Qty: 16 | Fill #0

## 2016-02-18 MED FILL — ?OMEPRAZOLE DR 20 MG CAPSUL: 20 | 30 days supply | Qty: 60 | Fill #1

## 2016-02-18 MED FILL — ?CYCLOBENZAPRINE 10 MG TABL: 10 | 20 days supply | Qty: 60 | Fill #1

## 2016-02-18 MED FILL — CEPHALEXIN 500 MG CAPSULE: 500 | 7 days supply | Qty: 28 | Fill #0

## 2016-02-20 LAB — URINE CULTURE: Culture: >=100000 — AB

## 2016-02-21 ENCOUNTER — Telehealth (HOSPITAL_BASED_OUTPATIENT_CLINIC_OR_DEPARTMENT_OTHER): Payer: Self-pay

## 2016-02-21 NOTE — Telephone Encounter (Signed)
Post ED Visit - Positive Culture Follow-up: Unsuccessful Patient Follow-up  Culture assessed and recommendations reviewed by: []  Elenor Quinones, Pharm.D. []  Heide Guile, Pharm.D., BCPS []  Parks Neptune, Pharm.D. []  Alycia Rossetti, Pharm.D., BCPS []  [place], Pharm.D., BCPS, AAHIVP []  Legrand Como, Pharm.D., BCPS, AAHIVP []  Milus Glazier, Pharm.D. []  Stephens November, Pharm.D. Dimitri Ped Pharm D Positive urine culture  []  Patient discharged without antimicrobial prescription and treatment is now indicated [x]  Organism is resistant to prescribed ED discharge antimicrobial []  Patient with positive blood cultures   Unable to contact patient after 3 attempts, letter will be sent to address on file  Genia Del 02/21/2016, 12:06 PM

## 2016-02-21 NOTE — Progress Notes (Signed)
ED Antimicrobial Stewardship Positive Culture Follow Up   Zarinah Hurry is an 45 y.o. female who presented to [facility] on 02/17/2016 with a chief complaint of  Chief Complaint  Patient presents with  . Urinary Frequency    Recent Results (from the past 720 hour(s))  Urine culture     Status: Abnormal   Collection Time: 02/17/16  5:03 PM  Result Value Ref Range Status   Specimen Description URINE, CLEAN CATCH  Final   Special Requests NONE  Final   Culture >=100,000 COLONIES/mL ESCHERICHIA COLI (A)  Final   Report Status 02/20/2016 FINAL  Final   Organism ID, Bacteria ESCHERICHIA COLI (A)  Final      Susceptibility   Escherichia coli - MIC*    AMPICILLIN >=32 RESISTANT Resistant     CEFAZOLIN >=64 RESISTANT Resistant     CEFTRIAXONE <=1 SENSITIVE Sensitive     CIPROFLOXACIN 0.5 SENSITIVE Sensitive     GENTAMICIN <=1 SENSITIVE Sensitive     IMIPENEM <=0.25 SENSITIVE Sensitive     NITROFURANTOIN <=16 SENSITIVE Sensitive     TRIMETH/SULFA <=20 SENSITIVE Sensitive     AMPICILLIN/SULBACTAM >=32 RESISTANT Resistant     PIP/TAZO 64 INTERMEDIATE Intermediate     Extended ESBL NEGATIVE Sensitive     * >=100,000 COLONIES/mL ESCHERICHIA COLI    [x]  Treated with cephalexin, organism resistant to prescribed antimicrobial  New antibiotic prescription: Stop cephalexin. Start Bactrim DS 1 tablet BID x 10 days  ED Provider: Gloriann Loan, PA-C  Dimitri Ped, PharmD. PGY-2 Infectious Diseases Pharmacy Resident Pager: [phone] 02/21/2016, 9:12 AM

## 2016-02-24 MED FILL — clonazePAM 0.5 MG TABS: 0.5 | 15 days supply | Qty: 30 | Fill #3

## 2016-02-25 ENCOUNTER — Telehealth (HOSPITAL_BASED_OUTPATIENT_CLINIC_OR_DEPARTMENT_OTHER): Payer: Self-pay | Admitting: Emergency Medicine

## 2016-02-25 MED FILL — SULFAMETHOXAZOLE/TMP DS TAB: 800-160 | 10 days supply | Qty: 20 | Fill #0

## 2016-03-04 ENCOUNTER — Encounter: Payer: Self-pay | Admitting: Internal Medicine

## 2016-03-04 ENCOUNTER — Ambulatory Visit: Payer: Self-pay | Attending: Internal Medicine | Admitting: Internal Medicine

## 2016-03-04 VITALS — BP 129/79 | HR 111 | Temp 98.2°F | Resp 18 | Ht 71.0 in | Wt 182.2 lb

## 2016-03-04 DIAGNOSIS — I10 Essential (primary) hypertension: Secondary | ICD-10-CM | POA: Insufficient documentation

## 2016-03-04 DIAGNOSIS — R21 Rash and other nonspecific skin eruption: Secondary | ICD-10-CM | POA: Insufficient documentation

## 2016-03-04 DIAGNOSIS — F411 Generalized anxiety disorder: Secondary | ICD-10-CM | POA: Insufficient documentation

## 2016-03-04 DIAGNOSIS — X58XXXA Exposure to other specified factors, initial encounter: Secondary | ICD-10-CM | POA: Insufficient documentation

## 2016-03-04 DIAGNOSIS — T7840XA Allergy, unspecified, initial encounter: Secondary | ICD-10-CM | POA: Insufficient documentation

## 2016-03-04 DIAGNOSIS — Z79899 Other long term (current) drug therapy: Secondary | ICD-10-CM | POA: Insufficient documentation

## 2016-03-04 MED ORDER — DIPHENHYDRAMINE HCL 25 MG PO TABS: 25.0000 mg | ORAL_TABLET | Freq: Three times a day (TID) | ORAL | 0 refills | Status: DC | PRN

## 2016-03-04 MED ORDER — TRIAMCINOLONE ACETONIDE 0.1 % EX CREA: 1.0000 "application " | TOPICAL_CREAM | Freq: Two times a day (BID) | CUTANEOUS | 2 refills | Status: DC

## 2016-03-04 MED ORDER — PREDNISONE 20 MG PO TABS
20.0000 mg | ORAL_TABLET | Freq: Two times a day (BID) | ORAL | 0 refills | Status: DC
Start: 2016-03-04 — End: 2016-09-22

## 2016-03-04 MED FILL — TRIAMCINOLONE 0.1% CREAM: 0.1 | 20 days supply | Qty: 30 | Fill #0

## 2016-03-04 MED FILL — predniSONE 20 MG TABS: 20 | 5 days supply | Qty: 10 | Fill #0

## 2016-03-04 NOTE — Progress Notes (Signed)
Isabel Evans, is a 45 y.o. female  [id number]  [id number]  DOB - 1970/06/28  Chief Complaint  Patient presents with  . Hospitalization Follow-up  . Rash      Subjective:   Isabel Evans is a 45 y.o. female with history of irritable bowel syndrome, fibromyalgia, major depression and anxiety, hypertension and GERD here today for ED visit follow up. Patient was recently seen in the ED and treated for UTI with antibiotics. While on this medication, she developed itching and rashes around her trunk. UTI symptoms have resolved but this itching is troubling. No tongue or throat swelling, no SOB. Patient has No headache, No chest pain, No abdominal pain - No Nausea, No new weakness tingling or numbness.  No problems updated.  ALLERGIES: No Known Allergies  PAST MEDICAL HISTORY: Past Medical History:  Diagnosis Date  . Bone spur    cervical spine  . Deviated nasal septum    states is unable to lie flat, because her nose will become congested and she can stop breathing  . Fibromyalgia   . GERD (gastroesophageal reflux disease)   . Hallux abductovalgus with bunions 09/2014   right   . Hammertoe 09/2014   right 4th, 5th  . Hypertension    states is under control with med., has been on med. x 8 mos.  . IBS (irritable bowel syndrome)    no current med.  . Osteoarthritis    hands  . Sinus headache     MEDICATIONS AT HOME: Prior to Admission medications   Medication Sig Start Date End Date Taking? Authorizing Provider  amLODipine (NORVASC) 10 MG tablet TAKE 1 TABLET BY MOUTH ONCE DAILY Patient taking differently: TAKE 10 MG BY MOUTH ONCE DAILY 02/25/15  Yes Tresa Garter, MD  cephALEXin (KEFLEX) 500 MG capsule Take 1 capsule (500 mg total) by mouth 4 (four) times daily. 02/17/16  Yes Hope Bunnie Pion, NP  cyclobenzaprine (FLEXERIL) 10 MG tablet Take 1 tablet (10 mg total) by mouth 3 (three) times daily as needed. for muscle spams 01/01/16  Yes Ndea Kilroy E Doreene Burke, MD    DULoxetine (CYMBALTA) 30 MG capsule TAKE 1 CAPSULE BY MOUTH 2 TIMES DAILY Patient taking differently: TAKE 30 MG BY MOUTH 2 TIMES DAILY 10/06/15  Yes Tresa Garter, MD  fluticasone (FLONASE) 50 MCG/ACT nasal spray Place 2 sprays into both nostrils daily. 01/01/16  Yes Tresa Garter, MD  gabapentin (NEURONTIN) 300 MG capsule Take 1 capsule (300 mg total) by mouth 3 (three) times daily. 01/01/16  Yes Tresa Garter, MD  omeprazole (PRILOSEC) 20 MG capsule Take 2 capsules (40 mg total) by mouth daily. 01/01/16  Yes Tresa Garter, MD  phenazopyridine (PYRIDIUM) 200 MG tablet Take 1 tablet (200 mg total) by mouth 3 (three) times daily. 02/17/16  Yes [place], NP  traMADol (ULTRAM) 50 MG tablet Take 1 tablet (50 mg total) by mouth every 6 (six) hours as needed. 02/17/16  Yes Hope Bunnie Pion, NP  clonazePAM (KLONOPIN) 0.5 MG tablet Take 1 tablet (0.5 mg total) by mouth 2 (two) times daily as needed. for anxiety 01/31/16 03/02/16  Tresa Garter, MD  diphenhydrAMINE (BENADRYL) 25 MG tablet Take 1 tablet (25 mg total) by mouth every 8 (eight) hours as needed. 03/04/16   Tresa Garter, MD  ondansetron (ZOFRAN ODT) 8 MG disintegrating tablet Take 1 tablet (8 mg total) by mouth every 8 (eight) hours as needed for nausea or vomiting. Patient not taking: Reported on  03/04/2016 11/17/15   Jola Schmidt, MD  predniSONE (DELTASONE) 20 MG tablet Take 1 tablet (20 mg total) by mouth 2 (two) times daily with a meal. 03/04/16   Tresa Garter, MD  triamcinolone cream (KENALOG) 0.1 % Apply 1 application topically 2 (two) times daily. 03/04/16   Tresa Garter, MD    Objective:   Vitals:   03/04/16 1538  BP: 129/79  Pulse: (!) 111  Resp: 18  Temp: 98.2 F (36.8 C)  TempSrc: Oral  SpO2: 99%  Weight: 182 lb 3.2 oz (82.6 kg)  Height: 5\' 11"  (1.803 m)   Exam General appearance : Awake, alert, not in any distress. Speech Clear. Not toxic looking HEENT: Atraumatic and  Normocephalic, pupils equally reactive to light and accomodation Neck: Supple, no JVD. No cervical lymphadenopathy.  Chest: Good air entry bilaterally, no added sounds  CVS: S1 S2 regular, no murmurs.  Abdomen: Bowel sounds present, Non tender and not distended with no gaurding, rigidity or rebound. Extremities: B/L Lower Ext shows no edema, both legs are warm to touch Neurology: Awake alert, and oriented X 3, CN II-XII intact, Non focal Skin: Macular rashes on chest and upper back, scratch marks  Data Review Lab Results  Component Value Date   HGBA1C 5.7 11/27/2015    Assessment & Plan   1. Essential hypertension  We have discussed target BP range and blood pressure goal. I have advised patient to check BP regularly and to call us back or report to clinic if the numbers are consistently higher than 140/90. We discussed the importance of compliance with medical therapy and DASH diet recommended, consequences of uncontrolled hypertension discussed.  - continue current BP medications  2. Allergic reaction, initial encounter  - diphenhydrAMINE (BENADRYL) 25 MG tablet; Take 1 tablet (25 mg total) by mouth every 8 (eight) hours as needed.  Dispense: 30 tablet; Refill: 0 - predniSONE (DELTASONE) 20 MG tablet; Take 1 tablet (20 mg total) by mouth 2 (two) times daily with a meal.  Dispense: 10 tablet; Refill: 0 - triamcinolone cream (KENALOG) 0.1 %; Apply 1 application topically 2 (two) times daily.  Dispense: 30 g; Refill: 2  Patient have been counseled extensively about nutrition and exercise. Other issues discussed during this visit include: low cholesterol diet, weight control and daily exercise, importance of adherence with medications and regular follow-up. We also discussed long term complications of uncontrolled  hypertension.   Return in about 6 months (around 09/01/2016) for Follow up HTN, Generalized Anxiety Disorder.  The patient was given clear instructions to go to ER or return  to medical center if symptoms don't improve, worsen or new problems develop. The patient verbalized understanding. The patient was told to call to get lab results if they haven't heard anything in the next week.   This note has been created with Surveyor, quantity. Any transcriptional errors are unintentional.    Angelica Chessman, MD, [place], [place], [place], [place] and [place], [place]   03/04/2016, 4:36 PM

## 2016-03-04 NOTE — Patient Instructions (Signed)
Hypertension Hypertension, commonly called high blood pressure, is when the force of blood pumping through your arteries is too strong. Your arteries are the blood vessels that carry blood from your heart throughout your body. A blood pressure reading consists of a higher number over a lower number, such as 110/72. The higher number (systolic) is the pressure inside your arteries when your heart pumps. The lower number (diastolic) is the pressure inside your arteries when your heart relaxes. Ideally you want your blood pressure below 120/80. Hypertension forces your heart to work harder to pump blood. Your arteries may become narrow or stiff. Having untreated or uncontrolled hypertension can cause heart attack, stroke, kidney disease, and other problems. What increases the risk? Some risk factors for high blood pressure are controllable. Others are not. Risk factors you cannot control include: Race. You may be at higher risk if you are African American. Age. Risk increases with age. Gender. Men are at higher risk than women before age 19 years. After age 27, women are at higher risk than men. Risk factors you can control include: Not getting enough exercise or physical activity. Being overweight. Getting too much fat, sugar, calories, or salt in your diet. Drinking too much alcohol. What are the signs or symptoms? Hypertension does not usually cause signs or symptoms. Extremely high blood pressure (hypertensive crisis) may cause headache, anxiety, shortness of breath, and nosebleed. How is this diagnosed? To check if you have hypertension, your health care provider will measure your blood pressure while you are seated, with your arm held at the level of your heart. It should be measured at least twice using the same arm. Certain conditions can cause a difference in blood pressure between your right and left arms. A blood pressure reading that is higher than normal on one occasion does not mean that  you need treatment. If it is not clear whether you have high blood pressure, you may be asked to return on a different day to have your blood pressure checked again. Or, you may be asked to monitor your blood pressure at home for 1 or more weeks. How is this treated? Treating high blood pressure includes making lifestyle changes and possibly taking medicine. Living a healthy lifestyle can help lower high blood pressure. You may need to change some of your habits. Lifestyle changes may include: Following the DASH diet. This diet is high in fruits, vegetables, and whole grains. It is low in salt, red meat, and added sugars. Keep your sodium intake below 2,300 mg per day. Getting at least 30-45 minutes of aerobic exercise at least 4 times per week. Losing weight if necessary. Not smoking. Limiting alcoholic beverages. Learning ways to reduce stress. Your health care provider may prescribe medicine if lifestyle changes are not enough to get your blood pressure under control, and if one of the following is true: You are 80-21 years of age and your systolic blood pressure is above 140. You are 41 years of age or older, and your systolic blood pressure is above 150. Your diastolic blood pressure is above 90. You have diabetes, and your systolic blood pressure is over 532 or your diastolic blood pressure is over 90. You have kidney disease and your blood pressure is above 140/90. You have heart disease and your blood pressure is above 140/90. Your personal target blood pressure may vary depending on your medical conditions, your age, and other factors. Follow these instructions at home: Have your blood pressure rechecked as directed by your  health care provider. Take medicines only as directed by your health care provider. Follow the directions carefully. Blood pressure medicines must be taken as prescribed. The medicine does not work as well when you skip doses. Skipping doses also puts you at risk for  problems. Do not smoke. Monitor your blood pressure at home as directed by your health care provider. Contact a health care provider if: You think you are having a reaction to medicines taken. You have recurrent headaches or feel dizzy. You have swelling in your ankles. You have trouble with your vision. Get help right away if: You develop a severe headache or confusion. You have unusual weakness, numbness, or feel faint. You have severe chest or abdominal pain. You vomit repeatedly. You have trouble breathing. This information is not intended to replace advice given to you by your health care provider. Make sure you discuss any questions you have with your health care provider. Document Released: 04/05/2005 Document Revised: 09/11/2015 Document Reviewed: 01/26/2013 Elsevier Interactive Patient Education  2017 [place] Allergy A drug allergy is when the body's disease-fighting system (immune system) reacts badly to a medicine. Drug allergies range from mild to severe, and they can be life-threatening in some cases. Some allergic reactions occur one week or more after you are exposed to a medicine (delayed reaction). A sudden (acute), severe allergic reaction that affects multiple areas of the body is called an anaphylactic reaction (anaphylaxis). Anaphylaxis can be life-threatening. All allergic reactions to a medicine require medical evaluation, even if an allergic reaction appears to be mild (minor). What are the causes? Drug allergies happen when the immune system wrongly identifies a medicine as being harmful. When this happens, the body releases proteins (antibodies) and other compounds, such as histamine, into the bloodstream. This causes swelling in certain tissues and loss of blood pressure to important areas, such as the heart and lungs. Almost any medicine can cause an allergic reaction. Medicines that commonly cause allergic reactions (are common allergens)  include:  Penicillin.  Sulfa medicines (sulfonamides).  Medicines that numb certain areas of the body (local anesthetics).  X-ray dyes that contain iodine. What are the signs or symptoms? Mild Allergic Reaction  Nasal congestion.  Tingling in the mouth.  An itchy, red rash. Severe Allergic Reaction  Swelling of the eyes, lips, face, or tongue.  Swelling of the back of the mouth and the throat.  Wheezing.  A hoarse voice.  Itchy, red, swollen areas of skin (hives).  Dizziness or light-headedness.  Fainting.  Anxiety or confusion.  Abdominal pain.  Difficulty breathing, speaking, or swallowing.  Chest tightness.  Fast or irregular heartbeats (palpitations).  Vomiting.  Diarrhea. How is this diagnosed? This condition is diagnosed from a physical exam and your history of recent exposure to one or more medicines. You may be referred for follow-up testing by a health care provider who specializes in allergies. This testing can confirm the diagnosis of a drug allergy and determine which medicines you are allergic to. Testing may include:  Skin tests. These may involve:  Injecting a small amount of the possible allergen between layers of your skin (intradermal injection).  Applying patches to your skin.  Blood tests.  Drug challenge. For this test, a health care provider gives you a small amount of a medicine in gradual doses while watching for an allergic reaction. If you are unsure of what caused your allergic reaction, your health care provider may ask you for:  Information about all medicines that you  take on a regular basis, including:  The name of each medicine.  How much (dosage) and how many times you take each medicine per day.  The form of each medicine, such as pill, liquid, or injection.  The date and time of your reaction. How is this treated? There is no cure for allergies. However, an allergic reaction can be treated with:  Medicines that  help:  To reduce pain and swelling (NSAIDs).  To relieve itching and hives (antihistamines).  To reduce swelling (corticosteroids).  Respiratory inhalers. These are inhaled medicines that help to widen (dilate) the airways in your lungs.  Injections of medicine that helps to relax the muscles in your airways and tighten your blood vessels (epinephrine). Severe allergic reactions, such as anaphylaxis, require immediate treatment in a hospital. If you have an anaphylactic reaction, you may need to be hospitalized for observation. You may be prescribed rescue medicines, such as epinephrine. Epinephrine comes in many forms, including what is commonly called an auto-injector "pen" (pre-filled automatic epinephrine injection device). Follow these instructions at home: If You Have a Severe Allergy  Always keep an auto-injector pen or your anaphylaxis kit near you. These can be lifesaving if you have a severe reaction. Use your auto-injector pen or anaphylaxis kit as told by your health care provider.  Make sure that you, the members of your household, and your employer know:  How to use an anaphylaxis kit.  How to use an auto-injector pen to give you an epinephrine injection.  Replace your epinephrine immediately after you use your auto-injector pen, in case you have another reaction.  Wear a medical alert bracelet or necklace that states your drug allergy, if told by your health care provider. General instructions  Avoidmedicines that you are allergic to.  Take over-the-counter and prescription medicines only as told by your health care provider.  If you were given medicines to treat your reaction, do not drive until your health care provider approves.  If you have hives or a rash:  Use an over-the-counter antihistamineas told by your health care provider.  Apply cold, wet cloths (cold compresses) to your skin or take baths or showers in cool water. Avoid hot water.  If you had  tests done, it is your responsibility to get your test results. Ask your health care provider when your results will be ready.  Tell any health care providers who care for you that you have a drug allergy.  Keep all follow-up visits as told by your health care provider. This is important. Contact a health care provider if:  You think that you are having an allergic reaction. Symptoms of an allergic reaction usually start within 30 minutes after you are exposed to a medicine.  You have symptoms that last more than 2 days after your reaction.  Your symptoms get worse.  You develop new symptoms. Get help right away if:  You needed to use epinephrine.  An epinephrine injection helps to manage life-threatening allergic reactions, but you still need to go to the emergency room even if epinephrine seems to work. This is important because anaphylaxis may happen again within 72 hours (rebound anaphylaxis).  If you used epinephrine to treat anaphylaxis outside of the hospital, you need additional medical care. This may include more doses of epinephrine.  You develop symptoms of a severe allergic reaction. These symptoms may represent a serious problem that is an emergency. Do not wait to see if the symptoms will go away. Use your auto-injector pen  or anaphylaxis kit as you have been instructed, and get medical help right away. Call your local emergency services (911 in the [place] Do not drive yourself to the hospital.  This information is not intended to replace advice given to you by your health care provider. Make sure you discuss any questions you have with your health care provider. Document Released: 04/05/2005 Document Revised: 08/15/2015 Document Reviewed: 11/05/2014 Elsevier Interactive Patient Education  2017 [employer].

## 2016-03-04 NOTE — Progress Notes (Signed)
Patient is here for UTI HFU  Patient complains of a rash being present on her thoracic region. Patient states rash has been present for 2 days and began on her stomach and moved to the chest area and shoulders. Patient took Benadryl this morning.  Patient has taken medication today. Patient has eaten today.

## 2016-03-17 ENCOUNTER — Other Ambulatory Visit: Payer: Self-pay | Admitting: Internal Medicine

## 2016-03-17 MED FILL — AMLODIPINE BESYLATE 10 MG T: 10 | 30 days supply | Qty: 30 | Fill #0

## 2016-03-17 MED FILL — CYCLOBENZAPRINE 10 MG TAB: 10 | 20 days supply | Qty: 60 | Fill #2

## 2016-03-17 MED FILL — GABAPENTIN 300 MG CAPSULE: 300 | 30 days supply | Qty: 90 | Fill #2

## 2016-03-17 MED FILL — ?OMEPRAZOLE DR 20 MG CAPSUL: 20 | 30 days supply | Qty: 60 | Fill #2

## 2016-03-19 ENCOUNTER — Other Ambulatory Visit: Payer: Self-pay | Admitting: Internal Medicine

## 2016-03-19 MED FILL — DULoxetine HCL 30 MG CPEP: 30 | 30 days supply | Qty: 60 | Fill #0

## 2016-03-25 MED FILL — clonazePAM 0.5 MG TABS: 0.5 | 15 days supply | Qty: 30 | Fill #4

## 2016-04-20 MED FILL — clonazePAM 0.5 MG TABS: 0.5 | 15 days supply | Qty: 30 | Fill #5

## 2016-04-26 MED FILL — DULoxetine HCL 30 MG CPEP: 30 | 30 days supply | Qty: 60 | Fill #1

## 2016-04-26 MED FILL — ?OMEPRAZOLE DR 20 MG CAPSUL: 20 | 30 days supply | Qty: 60 | Fill #3

## 2016-04-26 MED FILL — AMLODIPINE BESYLATE 10 MG T: 10 | 30 days supply | Qty: 30 | Fill #1

## 2016-04-26 MED FILL — GABAPENTIN 300 MG CAPSULE: 300 | 30 days supply | Qty: 90 | Fill #0

## 2016-04-26 MED FILL — CYCLOBENZAPRINE 10 MG TAB: 10 | 20 days supply | Qty: 60 | Fill #3

## 2016-05-20 ENCOUNTER — Other Ambulatory Visit: Payer: Self-pay | Admitting: Internal Medicine

## 2016-05-20 DIAGNOSIS — F411 Generalized anxiety disorder: Secondary | ICD-10-CM

## 2016-05-20 NOTE — Telephone Encounter (Signed)
Patient is requesting prescription Clonazepam....please follow up

## 2016-05-21 NOTE — Telephone Encounter (Signed)
Patient is following up on request for Clonazepam..Marland KitchenMarland KitchenMarland Kitchenplease call patient

## 2016-05-25 NOTE — Telephone Encounter (Signed)
Patient is calling to follow up on request for Clonazepam

## 2016-05-25 NOTE — Telephone Encounter (Signed)
Patient verified DOB Patient is requesting a refill on Klonopin to aid in sleeping. Last refilled on 01/31/16. Patient is also requesting a refill on IBS medication.

## 2016-05-26 MED ORDER — CLONAZEPAM 0.5 MG PO TABS: 0.5000 mg | ORAL_TABLET | Freq: Two times a day (BID) | ORAL | 0 refills | Status: DC | PRN

## 2016-05-27 ENCOUNTER — Other Ambulatory Visit: Payer: Self-pay | Admitting: Internal Medicine

## 2016-05-27 DIAGNOSIS — M797 Fibromyalgia: Secondary | ICD-10-CM

## 2016-05-27 MED FILL — ?CYCLOBENZAPRINE 10 MG TABL: 10 | 10 days supply | Qty: 30 | Fill #1

## 2016-05-27 MED FILL — GABAPENTIN 300 MG CAPSULE: 300 | 30 days supply | Qty: 90 | Fill #1

## 2016-05-27 MED FILL — AMLODIPINE BESYLATE 10 MG T: 10 | 30 days supply | Qty: 30 | Fill #2

## 2016-05-27 MED FILL — DULoxetine HCL 30 MG CPEP: 30 | 30 days supply | Qty: 60 | Fill #2

## 2016-05-27 MED FILL — ?OMEPRAZOLE DR 20 MG CAPSUL: 20 | 30 days supply | Qty: 60 | Fill #4

## 2016-05-27 NOTE — Telephone Encounter (Signed)
Medication has been placed at the front desk for pickup

## 2016-06-09 ENCOUNTER — Other Ambulatory Visit: Payer: Self-pay | Admitting: Internal Medicine

## 2016-06-09 ENCOUNTER — Ambulatory Visit: Payer: Self-pay | Admitting: Internal Medicine

## 2016-06-09 DIAGNOSIS — M797 Fibromyalgia: Secondary | ICD-10-CM

## 2016-06-09 DIAGNOSIS — F411 Generalized anxiety disorder: Secondary | ICD-10-CM

## 2016-06-10 MED FILL — clonazePAM 0.5 MG TABS: 0.5 | 30 days supply | Qty: 60 | Fill #0

## 2016-06-10 NOTE — Telephone Encounter (Signed)
Medical Assistant left message on patient's home and cell voicemail. Voicemail states to give a call back to [place] with [facility] at [phone]. Patient cancelled appointment on yesterday and requested a refill on medications. Patient advised of refill not being appropriate and needing to keep scheduled appointments.

## 2016-06-27 ENCOUNTER — Other Ambulatory Visit: Payer: Self-pay | Admitting: Internal Medicine

## 2016-06-27 DIAGNOSIS — M797 Fibromyalgia: Secondary | ICD-10-CM

## 2016-06-30 ENCOUNTER — Other Ambulatory Visit: Payer: Self-pay | Admitting: Internal Medicine

## 2016-06-30 MED FILL — ?OMEPRAZOLE DR 20 MG CAPSUL: 20 | 30 days supply | Qty: 60 | Fill #5

## 2016-06-30 MED FILL — GABAPENTIN 300 MG CAPSULE: 300 | 30 days supply | Qty: 90 | Fill #2

## 2016-06-30 MED FILL — ?AMLODIPINE BESYLATE 10 MG: 10 | 30 days supply | Qty: 30 | Fill #3

## 2016-06-30 MED FILL — DULoxetine HCL 30 MG CPEP: 30 | 30 days supply | Qty: 60 | Fill #3

## 2016-06-30 NOTE — Telephone Encounter (Signed)
Do you approve a refill for the same quantity?

## 2016-07-06 ENCOUNTER — Telehealth: Payer: Self-pay | Admitting: Internal Medicine

## 2016-07-06 ENCOUNTER — Other Ambulatory Visit: Payer: Self-pay | Admitting: Internal Medicine

## 2016-07-06 NOTE — Telephone Encounter (Signed)
Patient also requested a prescription refill for clonazePAM (KLONOPIN) 0.5 MG tablet.   Thank you.

## 2016-07-06 NOTE — Telephone Encounter (Signed)
Patient has been experiencing nausea and would like to know if she can be prescribed Zofran.  Please follow up

## 2016-07-07 MED FILL — DICYCLOMINE 10 MG CAPSULE: 10 | 30 days supply | Qty: 120 | Fill #0

## 2016-07-08 ENCOUNTER — Other Ambulatory Visit: Payer: Self-pay | Admitting: Internal Medicine

## 2016-07-08 DIAGNOSIS — F411 Generalized anxiety disorder: Secondary | ICD-10-CM

## 2016-07-13 ENCOUNTER — Other Ambulatory Visit: Payer: Self-pay | Admitting: Internal Medicine

## 2016-07-13 DIAGNOSIS — F411 Generalized anxiety disorder: Secondary | ICD-10-CM

## 2016-07-13 MED ORDER — ONDANSETRON 8 MG PO TBDP: 8.0000 mg | ORAL_TABLET | Freq: Three times a day (TID) | ORAL | 0 refills | Status: DC | PRN

## 2016-07-13 MED ORDER — CLONAZEPAM 0.5 MG PO TABS: 0.5000 mg | ORAL_TABLET | Freq: Two times a day (BID) | ORAL | 0 refills | Status: DC | PRN

## 2016-07-13 NOTE — Telephone Encounter (Signed)
Refilled

## 2016-07-14 MED FILL — clonazePAM 0.5 MG TABS: 0.5 | 30 days supply | Qty: 60 | Fill #0

## 2016-07-14 MED FILL — ONDANSETRON ODT 8 MG TABLET: 8 | 3 days supply | Qty: 10 | Fill #0

## 2016-07-26 ENCOUNTER — Other Ambulatory Visit: Payer: Self-pay | Admitting: Internal Medicine

## 2016-07-27 MED ORDER — GABAPENTIN 300 MG PO CAPS: 300.0000 mg | ORAL_CAPSULE | Freq: Three times a day (TID) | ORAL | 3 refills | Status: DC

## 2016-07-27 MED FILL — GABAPENTIN 300 MG CAPSULE: 300 | 30 days supply | Qty: 90 | Fill #0

## 2016-07-27 MED FILL — ?CYCLOBENZAPRINE 10 MG TABL: 10 | 10 days supply | Qty: 30 | Fill #0

## 2016-08-04 ENCOUNTER — Other Ambulatory Visit: Payer: Self-pay | Admitting: Internal Medicine

## 2016-08-04 MED FILL — FLUTICASONE PROP 50 MCG SPR: 50 | 30 days supply | Qty: 16 | Fill #1

## 2016-08-04 MED FILL — AMLODIPINE BESYLATE 10 MG T: 10 | 30 days supply | Qty: 30 | Fill #4

## 2016-08-04 NOTE — Telephone Encounter (Addendum)
I have refilled duloxetine and ondansetron. Will defer to Dr. Doreene Burke for clonazepam refill.

## 2016-08-04 NOTE — Telephone Encounter (Signed)
Medication Refill:  Clonazepam (Klonopin) Duloxetine (Cymbalta) Ondansetron (Zofran)

## 2016-08-05 MED FILL — ONDANSETRON ODT 8 MG TABLET: 8 | 3 days supply | Qty: 10 | Fill #0

## 2016-08-05 MED FILL — DULoxetine HCL 30 MG CPEP: 30 | 30 days supply | Qty: 60 | Fill #0

## 2016-08-06 ENCOUNTER — Telehealth: Payer: Self-pay | Admitting: Internal Medicine

## 2016-08-06 MED FILL — ?OMEPRAZOLE DR 20 MG CAPSUL: 20 | 30 days supply | Qty: 60 | Fill #6

## 2016-08-06 NOTE — Telephone Encounter (Signed)
Patient came to the office to request medication refill for clonazePAM (KLONOPIN) 0.5 MG tablet.  Thank you.

## 2016-08-10 NOTE — Telephone Encounter (Signed)
Patient came to check in on Klonopin refill Rx  Patient was informed to check back on April 27th since the last Rx was written on March 27th

## 2016-08-18 ENCOUNTER — Other Ambulatory Visit: Payer: Self-pay | Admitting: Internal Medicine

## 2016-08-18 DIAGNOSIS — F411 Generalized anxiety disorder: Secondary | ICD-10-CM

## 2016-08-18 MED ORDER — CLONAZEPAM 0.5 MG PO TABS: 0.5000 mg | ORAL_TABLET | Freq: Two times a day (BID) | ORAL | 0 refills | Status: DC | PRN

## 2016-08-18 NOTE — Telephone Encounter (Signed)
Refilled

## 2016-09-10 ENCOUNTER — Telehealth: Payer: Self-pay | Admitting: Internal Medicine

## 2016-09-10 ENCOUNTER — Other Ambulatory Visit: Payer: Self-pay | Admitting: Internal Medicine

## 2016-09-10 MED FILL — ?CYCLOBENZAPRINE 10 MG TABL: 10 | 10 days supply | Qty: 30 | Fill #1

## 2016-09-10 MED FILL — ?AMLODIPINE BESYLATE 10 MG: 10 | 30 days supply | Qty: 30 | Fill #5

## 2016-09-10 MED FILL — FLUTICASONE PROP 50 MCG SPR: 50 | 30 days supply | Qty: 16 | Fill #2

## 2016-09-10 MED FILL — clonazePAM 0.5 MG TABS: 0.5 | 30 days supply | Qty: 60 | Fill #0

## 2016-09-10 MED FILL — ?OMEPRAZOLE DR 20 MG CAPSUL: 20 | 30 days supply | Qty: 60 | Fill #7

## 2016-09-10 MED FILL — GABAPENTIN 300 MG CAPSULE: 300 | 30 days supply | Qty: 90 | Fill #1

## 2016-09-10 MED FILL — ?DULOXETINE HCL DR 30 MG CA: 30 MG | 30 days supply | Qty: 60 | Fill #1

## 2016-09-10 NOTE — Telephone Encounter (Signed)
Patient called the office to request medication refill for ondansetron (ZOFRAN-ODT) 8 MG disintegrating tablet.  Thank you.

## 2016-09-15 ENCOUNTER — Telehealth: Payer: Self-pay | Admitting: Internal Medicine

## 2016-09-15 ENCOUNTER — Other Ambulatory Visit: Payer: Self-pay | Admitting: Internal Medicine

## 2016-09-15 MED ORDER — ONDANSETRON 8 MG PO TBDP: ORAL_TABLET | ORAL | 0 refills | Status: DC

## 2016-09-15 MED FILL — ONDANSETRON ODT 8 MG TABLET: 8 | 6 days supply | Qty: 20 | Fill #0

## 2016-09-15 NOTE — Telephone Encounter (Signed)
Called and informed patient that medication was refilled.

## 2016-09-15 NOTE — Telephone Encounter (Signed)
Refilled

## 2016-09-15 NOTE — Telephone Encounter (Signed)
Patient called the request medication refill for cyclobenzaprine (FLEXERIL) 10 MG tablet.   Thank you.

## 2016-09-16 ENCOUNTER — Ambulatory Visit: Payer: Self-pay

## 2016-09-20 ENCOUNTER — Other Ambulatory Visit: Payer: Self-pay | Admitting: Internal Medicine

## 2016-09-20 MED ORDER — CYCLOBENZAPRINE HCL 10 MG PO TABS: 10.0000 mg | ORAL_TABLET | Freq: Three times a day (TID) | ORAL | 1 refills | Status: DC | PRN

## 2016-09-20 MED FILL — ?CYCLOBENZAPRINE 10 MG TABL: 10 | 10 days supply | Qty: 30 | Fill #0

## 2016-09-20 NOTE — Telephone Encounter (Signed)
Refilled

## 2016-09-22 ENCOUNTER — Ambulatory Visit: Payer: Self-pay | Attending: Internal Medicine | Admitting: Internal Medicine

## 2016-09-22 ENCOUNTER — Encounter: Payer: Self-pay | Admitting: Internal Medicine

## 2016-09-22 VITALS — BP 99/67 | HR 93 | Temp 97.6°F | Resp 18 | Ht 69.0 in | Wt 182.0 lb

## 2016-09-22 DIAGNOSIS — Z23 Encounter for immunization: Secondary | ICD-10-CM

## 2016-09-22 DIAGNOSIS — R5383 Other fatigue: Secondary | ICD-10-CM | POA: Insufficient documentation

## 2016-09-22 DIAGNOSIS — M24542 Contracture, left hand: Secondary | ICD-10-CM | POA: Insufficient documentation

## 2016-09-22 DIAGNOSIS — K219 Gastro-esophageal reflux disease without esophagitis: Secondary | ICD-10-CM | POA: Insufficient documentation

## 2016-09-22 DIAGNOSIS — N939 Abnormal uterine and vaginal bleeding, unspecified: Secondary | ICD-10-CM | POA: Insufficient documentation

## 2016-09-22 DIAGNOSIS — I10 Essential (primary) hypertension: Secondary | ICD-10-CM | POA: Insufficient documentation

## 2016-09-22 DIAGNOSIS — N946 Dysmenorrhea, unspecified: Secondary | ICD-10-CM | POA: Insufficient documentation

## 2016-09-22 DIAGNOSIS — K589 Irritable bowel syndrome without diarrhea: Secondary | ICD-10-CM | POA: Insufficient documentation

## 2016-09-22 DIAGNOSIS — Z79899 Other long term (current) drug therapy: Secondary | ICD-10-CM | POA: Insufficient documentation

## 2016-09-22 DIAGNOSIS — N921 Excessive and frequent menstruation with irregular cycle: Secondary | ICD-10-CM

## 2016-09-22 DIAGNOSIS — M797 Fibromyalgia: Secondary | ICD-10-CM | POA: Insufficient documentation

## 2016-09-22 DIAGNOSIS — F411 Generalized anxiety disorder: Secondary | ICD-10-CM | POA: Insufficient documentation

## 2016-09-22 DIAGNOSIS — N926 Irregular menstruation, unspecified: Secondary | ICD-10-CM | POA: Insufficient documentation

## 2016-09-22 DIAGNOSIS — M2450 Contracture, unspecified joint: Secondary | ICD-10-CM

## 2016-09-22 DIAGNOSIS — D649 Anemia, unspecified: Secondary | ICD-10-CM | POA: Insufficient documentation

## 2016-09-22 DIAGNOSIS — F329 Major depressive disorder, single episode, unspecified: Secondary | ICD-10-CM | POA: Insufficient documentation

## 2016-09-22 MED ORDER — OMEPRAZOLE 20 MG PO CPDR: 40.0000 mg | DELAYED_RELEASE_CAPSULE | Freq: Every day | ORAL | 3 refills | Status: DC

## 2016-09-22 MED ORDER — AMLODIPINE BESYLATE 10 MG PO TABS: 10.0000 mg | ORAL_TABLET | Freq: Every day | ORAL | 3 refills | Status: DC

## 2016-09-22 MED ORDER — CLONAZEPAM 0.5 MG PO TABS: 0.5000 mg | ORAL_TABLET | Freq: Two times a day (BID) | ORAL | 0 refills | Status: DC | PRN

## 2016-09-22 MED ORDER — FLUTICASONE PROPIONATE 50 MCG/ACT NA SUSP: 2.0000 | Freq: Every day | NASAL | 3 refills | Status: DC

## 2016-09-22 MED ORDER — GABAPENTIN 300 MG PO CAPS: 300.0000 mg | ORAL_CAPSULE | Freq: Three times a day (TID) | ORAL | 3 refills | Status: DC

## 2016-09-22 MED ORDER — CYCLOBENZAPRINE HCL 10 MG PO TABS: 10.0000 mg | ORAL_TABLET | Freq: Three times a day (TID) | ORAL | 1 refills | Status: DC | PRN

## 2016-09-22 MED ORDER — DULOXETINE HCL 30 MG PO CPEP: ORAL_CAPSULE | ORAL | 3 refills | Status: DC

## 2016-09-22 NOTE — Patient Instructions (Addendum)
Menorrhagia Menorrhagia is when your menstrual periods are heavy or last longer than usual. Follow these instructions at home:  Only take medicine as told by your doctor.  Take any iron pills as told by your doctor. Heavy bleeding may cause low levels of iron in your body.  Do not take aspirin 1 week before or during your period. Aspirin can make the bleeding worse.  Lie down for a while if you change your tampon or pad more than once in 2 hours. This may help lessen the bleeding.  Eat a healthy diet and foods with iron. These foods include leafy green vegetables, meat, liver, eggs, and whole grain breads and cereals.  Do not try to lose weight. Wait until the heavy bleeding has stopped and your iron level is normal. Contact a doctor if:  You soak through a pad or tampon every 1 or 2 hours, and this happens every time you have a period.  You need to use pads and tampons at the same time because you are bleeding so much.  You need to change your pad or tampon during the night.  You have a period that lasts for more than 8 days.  You pass clots bigger than 1 inch (2.5 cm) wide.  You have irregular periods that happen more or less often than once a month.  You feel dizzy or pass out (faint).  You feel very weak or tired.  You feel short of breath or feel your heart is beating too fast when you exercise.  You feel sick to your stomach (nausea) and you throw up (vomit) while you are taking your medicine.  You have watery poop (diarrhea) while you are taking your medicine.  You have any problems that may be related to the medicine you are taking. Get help right away if:  You soak through 4 or more pads or tampons in 2 hours.  You have any bleeding while you are pregnant. This information is not intended to replace advice given to you by your health care provider. Make sure you discuss any questions you have with your health care provider. Document Released: 01/13/2008 Document  Revised: 09/11/2015 Document Reviewed: 10/05/2012 Elsevier Interactive Patient Education  2017 [place]. Generalized Anxiety Disorder, Adult Generalized anxiety disorder (GAD) is a mental health disorder. People with this condition constantly worry about everyday events. Unlike normal anxiety, worry related to GAD is not triggered by a specific event. These worries also do not fade or get better with time. GAD interferes with life functions, including relationships, work, and school. GAD can vary from mild to severe. People with severe GAD can have intense waves of anxiety with physical symptoms (panic attacks). What are the causes? The exact cause of GAD is not known. What increases the risk? This condition is more likely to develop in:  Women.  People who have a family history of anxiety disorders.  People who are very shy.  People who experience very stressful life events, such as the death of a loved one.  People who have a very stressful family environment.  What are the signs or symptoms? People with GAD often worry excessively about many things in their lives, such as their health and family. They may also be overly concerned about:  Doing well at work.  Being on time.  Natural disasters.  Friendships.  Physical symptoms of GAD include:  Fatigue.  Muscle tension or having muscle twitches.  Trembling or feeling shaky.  Being easily startled.  Feeling  like your heart is pounding or racing.  Feeling out of breath or like you cannot take a deep breath.  Having trouble falling asleep or staying asleep.  Sweating.  Nausea, diarrhea, or irritable bowel syndrome (IBS).  Headaches.  Trouble concentrating or remembering facts.  Restlessness.  Irritability.  How is this diagnosed? Your health care provider can diagnose GAD based on your symptoms and medical history. You will also have a physical exam. The health care provider will ask specific questions  about your symptoms, including how severe they are, when they started, and if they come and go. Your health care provider may ask you about your use of alcohol or drugs, including prescription medicines. Your health care provider may refer you to a mental health specialist for further evaluation. Your health care provider will do a thorough examination and may perform additional tests to rule out other possible causes of your symptoms. To be diagnosed with GAD, a person must have anxiety that:  Is out of his or her control.  Affects several different aspects of his or her life, such as work and relationships.  Causes distress that makes him or her unable to take part in normal activities.  Includes at least three physical symptoms of GAD, such as restlessness, fatigue, trouble concentrating, irritability, muscle tension, or sleep problems.  Before your health care provider can confirm a diagnosis of GAD, these symptoms must be present more days than they are not, and they must last for six months or longer. How is this treated? The following therapies are usually used to treat GAD:  Medicine. Antidepressant medicine is usually prescribed for long-term daily control. Antianxiety medicines may be added in severe cases, especially when panic attacks occur.  Talk therapy (psychotherapy). Certain types of talk therapy can be helpful in treating GAD by providing support, education, and guidance. Options include: ? Cognitive behavioral therapy (CBT). People learn coping skills and techniques to ease their anxiety. They learn to identify unrealistic or negative thoughts and behaviors and to replace them with positive ones. ? Acceptance and commitment therapy (ACT). This treatment teaches people how to be mindful as a way to cope with unwanted thoughts and feelings. ? Biofeedback. This process trains you to manage your body's response (physiological response) through breathing techniques and relaxation  methods. You will work with a therapist while machines are used to monitor your physical symptoms.  Stress management techniques. These include yoga, meditation, and exercise.  A mental health specialist can help determine which treatment is best for you. Some people see improvement with one type of therapy. However, other people require a combination of therapies. Follow these instructions at home:  Take over-the-counter and prescription medicines only as told by your health care provider.  Try to maintain a normal routine.  Try to anticipate stressful situations and allow extra time to manage them.  Practice any stress management or self-calming techniques as taught by your health care provider.  Do not punish yourself for setbacks or for not making progress.  Try to recognize your accomplishments, even if they are small.  Keep all follow-up visits as told by your health care provider. This is important. Contact a health care provider if:  Your symptoms do not get better.  Your symptoms get worse.  You have signs of depression, such as: ? A persistently sad, cranky, or irritable mood. ? Loss of enjoyment in activities that used to bring you joy. ? Change in weight or eating. ? Changes in sleeping habits. ?  Avoiding friends or family members. ? Loss of energy for normal tasks. ? Feelings of guilt or worthlessness. Get help right away if:  You have serious thoughts about hurting yourself or others. If you ever feel like you may hurt yourself or others, or have thoughts about taking your own life, get help right away. You can go to your nearest emergency department or call:  Your local emergency services (911 in the [place]  A suicide crisis helpline, such as the [place] at 770-118-4324. This is open 24 hours a day.  Summary  Generalized anxiety disorder (GAD) is a mental health disorder that involves worry that is not triggered by a specific  event.  People with GAD often worry excessively about many things in their lives, such as their health and family.  GAD may cause physical symptoms such as restlessness, trouble concentrating, sleep problems, frequent sweating, nausea, diarrhea, headaches, and trembling or muscle twitching.  A mental health specialist can help determine which treatment is best for you. Some people see improvement with one type of therapy. However, other people require a combination of therapies. This information is not intended to replace advice given to you by your health care provider. Make sure you discuss any questions you have with your health care provider. Document Released: 07/31/2012 Document Revised: 02/24/2016 Document Reviewed: 02/24/2016 Elsevier Interactive Patient Education  2018 [employer]. Fatigue Fatigue is feeling tired all of the time, a lack of energy, or a lack of motivation. Occasional or mild fatigue is often a normal response to activity or life in general. However, long-lasting (chronic) or extreme fatigue may indicate an underlying medical condition. Follow these instructions at home: Watch your fatigue for any changes. The following actions may help to lessen any discomfort you are feeling:  Talk to your health care provider about how much sleep you need each night. Try to get the required amount every night.  Take medicines only as directed by your health care provider.  Eat a healthy and nutritious diet. Ask your health care provider if you need help changing your diet.  Drink enough fluid to keep your urine clear or pale yellow.  Practice ways of relaxing, such as yoga, meditation, massage therapy, or acupuncture.  Exercise regularly.  Change situations that cause you stress. Try to keep your work and personal routine reasonable.  Do not abuse illegal drugs.  Limit alcohol intake to no more than 1 drink per day for nonpregnant women and 2 drinks per day for men. One  drink equals 12 ounces of beer, 5 ounces of wine, or 1 ounces of hard liquor.  Take a multivitamin, if directed by your health care provider.  Contact a health care provider if:  Your fatigue does not get better.  You have a fever.  You have unintentional weight loss or gain.  You have headaches.  You have difficulty: ? Falling asleep. ? Sleeping throughout the night.  You feel angry, guilty, anxious, or sad.  You are unable to have a bowel movement (constipation).  You skin is dry.  Your legs or another part of your body is swollen. Get help right away if:  You feel confused.  Your vision is blurry.  You feel faint or pass out.  You have a severe headache.  You have severe abdominal, pelvic, or back pain.  You have chest pain, shortness of breath, or an irregular or fast heartbeat.  You are unable to urinate or you urinate less than normal.  You develop abnormal bleeding, such as bleeding from the rectum, vagina, nose, lungs, or nipples.  You vomit blood.  You have thoughts about harming yourself or committing suicide.  You are worried that you might harm someone else. This information is not intended to replace advice given to you by your health care provider. Make sure you discuss any questions you have with your health care provider. Document Released: 01/31/2007 Document Revised: 09/11/2015 Document Reviewed: 08/07/2013 Elsevier Interactive Patient Education  [employer].

## 2016-09-22 NOTE — Progress Notes (Signed)
Patient is here for heavy bleeding  Patient had a cycle last week which lasted 5 days. Patient states the first day of her cycle she bled through the tampon, pad and clothing. Patient states the cycle was heavy all 5 days.  Patient complains of neck and shoulder pain being present and scaled at a 5.  Patient has taken medication today. Patient has eaten today.  Patient tolerated injection well today.

## 2016-09-22 NOTE — Progress Notes (Signed)
Isabel Evans, is a 46 y.o. female  AOZ:[id number]  NGE:[id number]  DOB - 12-23-1970  Chief Complaint  Patient presents with  . Dysmenorrhea       Subjective:   Isabel Evans is a 46 y.o. female with history of irritable bowel syndrome, fibromyalgia, major depression and anxiety, hypertension and GERD here today for a sick visit. Excessive menstrual bleeding for over a year, excessive fatigue, sleep less these days, always on the edge, easily frustrated at work and at home. Patient had her menstrual cycle last week which lasted 5 days. Patient states the first day of her cycle she bled through the tampon, pad and clothing. Patient states the cycle was heavy all 5 days. Patient also complains of neck and shoulder pain, chronic. She denies any suicidal ideation or thought. Patient has No headache, No chest pain, No abdominal pain - No Nausea, No new weakness tingling or numbness, No Cough - SOB. She needs refill of some of her medications.  Problem  Gastroesophageal Reflux Disease Without Esophagitis  Fatigue Associated With Anemia  Flexion Contractures  Menorrhagia With Irregular Cycle   ALLERGIES: No Known Allergies  PAST MEDICAL HISTORY: Past Medical History:  Diagnosis Date  . Bone spur    cervical spine  . Deviated nasal septum    states is unable to lie flat, because her nose will become congested and she can stop breathing  . Fibromyalgia   . GERD (gastroesophageal reflux disease)   . Hallux abductovalgus with bunions 09/2014   right   . Hammertoe 09/2014   right 4th, 5th  . Hypertension    states is under control with med., has been on med. x 8 mos.  . IBS (irritable bowel syndrome)    no current med.  . Osteoarthritis    hands  . Sinus headache     MEDICATIONS AT HOME: Prior to Admission medications   Medication Sig Start Date End Date Taking? Authorizing Provider  amLODipine (NORVASC) 10 MG tablet Take 1 tablet (10 mg total) by mouth daily. 09/22/16  Yes  Tresa Garter, MD  cyclobenzaprine (FLEXERIL) 10 MG tablet Take 1 tablet (10 mg total) by mouth 3 (three) times daily as needed. for muscle spams 09/22/16  Yes Braelee Herrle E, MD  dicyclomine (BENTYL) 10 MG capsule TAKE 1 CAPSULE BY MOUTH 4 TIMES DAILY - BEFORE MEALS AND AT BEDTIME. 07/06/16  Yes Doctor Sheahan E, MD  diphenhydrAMINE (BENADRYL) 25 MG tablet Take 1 tablet (25 mg total) by mouth every 8 (eight) hours as needed. 03/04/16  Yes Lurlie Wigen E, MD  DULoxetine (CYMBALTA) 30 MG capsule TAKE 1 CAPSULE BY MOUTH 2 TIMES DAILY 09/22/16  Yes Nalea Salce E, MD  fluticasone (FLONASE) 50 MCG/ACT nasal spray Place 2 sprays into both nostrils daily. 09/22/16  Yes Tresa Garter, MD  gabapentin (NEURONTIN) 300 MG capsule Take 1 capsule (300 mg total) by mouth 3 (three) times daily. 09/22/16  Yes Tresa Garter, MD  omeprazole (PRILOSEC) 20 MG capsule Take 2 capsules (40 mg total) by mouth daily. 09/22/16  Yes Airi Copado E, MD  ondansetron (ZOFRAN-ODT) 8 MG disintegrating tablet TAKE 1 TABLET BY MOUTH EVERY 8 HOURS AS NEEDED FOR NAUSEA OR VOMITING 09/15/16  Yes Aracelie Addis E, MD  clonazePAM (KLONOPIN) 0.5 MG tablet Take 1 tablet (0.5 mg total) by mouth 2 (two) times daily as needed. for anxiety 09/22/16 10/23/16  Tresa Garter, MD   Objective:   Vitals:   09/22/16 0859  BP:  99/67  Pulse: 93  Resp: 18  Temp: 97.6 F (36.4 C)  TempSrc: Oral  SpO2: 99%  Weight: 182 lb (82.6 kg)  Height: '5\' 9"'$  (1.753 m)   Exam General appearance : Awake, alert, not in any distress. Speech Clear. Not toxic looking HEENT: Atraumatic and Normocephalic, pupils equally reactive to light and accomodation Neck: Supple, no JVD. No cervical lymphadenopathy.  Chest: Good air entry bilaterally, no added sounds  CVS: S1 S2 regular, no murmurs.  Abdomen: Bowel sounds present, Non tender and not distended with no gaurding, rigidity or rebound. Extremities: B/L Lower Ext  shows no edema, both legs are warm to touch Neurology: Awake alert, and oriented X 3, CN II-XII intact, Non focal  Pelvic Exam: Cervix normal in appearance, external genitalia normal, no adnexal masses or tenderness, no cervical motion tenderness, rectovaginal septum normal, uterus normal size, shape, and consistency and vagina normal without discharge   Data Review Lab Results  Component Value Date   HGBA1C 5.7 11/27/2015    Assessment & Plan   1. Essential hypertension  - CMP14+EGFR - Lipid panel - Urinalysis, Complete  2. Generalized anxiety disorder  - clonazePAM (KLONOPIN) 0.5 MG tablet; Take 1 tablet (0.5 mg total) by mouth 2 (two) times daily as needed. for anxiety  Dispense: 60 tablet; Refill: 0 - fluticasone (FLONASE) 50 MCG/ACT nasal spray; Place 2 sprays into both nostrils daily.  Dispense: 16 g; Refill: 3  3. Fibromyalgia  - gabapentin (NEURONTIN) 300 MG capsule; Take 1 capsule (300 mg total) by mouth 3 (three) times daily.  Dispense: 270 capsule; Refill: 3  4. Gastroesophageal reflux disease without esophagitis  - omeprazole (PRILOSEC) 20 MG capsule; Take 2 capsules (40 mg total) by mouth daily.  Dispense: 180 capsule; Refill: 3  5. Fatigue associated with anemia  - CBC with Differential/Platelet - TSH - Split night study; Future  6. Flexion contractures of left hand  - Ambulatory referral to Hand Surgery  7. Menorrhagia with irregular cycle  - Cytology - PAP - Cervicovaginal ancillary only - HIV antibody (with reflex) - US Pelvis Complete; Future - US Transvaginal Non-OB; Future  Patient have been counseled extensively about nutrition and exercise. Other issues discussed during this visit include: low cholesterol diet, weight control and daily exercise, importance of adherence with medications and regular follow-up.   Return in about 6 months (around 03/24/2017) for Follow up Pain and comorbidities, Generalized Anxiety Disorder.  The patient was  given clear instructions to go to ER or return to medical center if symptoms don't improve, worsen or new problems develop. The patient verbalized understanding. The patient was told to call to get lab results if they haven't heard anything in the next week.   This note has been created with Surveyor, quantity. Any transcriptional errors are unintentional.    Angelica Chessman, MD, [place], [place], [place], [place] and [facility] [place], [place]   09/22/2016, 10:20 AM

## 2016-09-23 LAB — CMP14+EGFR
ALK PHOS: 79 IU/L (ref 39–117)
ALT: 12 IU/L (ref 0–32)
AST: 15 IU/L (ref 0–40)
Albumin/Globulin Ratio: 1.7 (ref 1.2–2.2)
Albumin: 4.5 g/dL (ref 3.5–5.5)
BUN/Creatinine Ratio: 10 (ref 9–23)
BUN: 8 mg/dL (ref 6–24)
Bilirubin Total: <0.2 mg/dL (ref 0.0–1.2)
CHLORIDE: 100 mmol/L (ref 96–106)
CO2: 29 mmol/L (ref 18–29)
Calcium: 9.4 mg/dL (ref 8.7–10.2)
Creatinine, Ser: 0.79 mg/dL (ref 0.57–1.00)
GFR calc Af Amer: 105 mL/min/{1.73_m2} (ref >59)
GFR calc non Af Amer: 91 mL/min/{1.73_m2} (ref >59)
GLUCOSE: 103 mg/dL — AB (ref 65–99)
Globulin, Total: 2.7 g/dL (ref 1.5–4.5)
Potassium: 4.3 mmol/L (ref 3.5–5.2)
Sodium: 140 mmol/L (ref 134–144)
Total Protein: 7.2 g/dL (ref 6.0–8.5)

## 2016-09-23 LAB — CBC WITH DIFFERENTIAL/PLATELET
BASOS ABS: 0.1 10*3/uL (ref 0.0–0.2)
Basos: 1 %
EOS (ABSOLUTE): 0.2 10*3/uL (ref 0.0–0.4)
Eos: 3 %
HEMOGLOBIN: 12.3 g/dL (ref 11.1–15.9)
Hematocrit: 39.5 % (ref 34.0–46.6)
Immature Grans (Abs): 0 10*3/uL (ref 0.0–0.1)
Immature Granulocytes: 0 %
LYMPHS ABS: 2.3 10*3/uL (ref 0.7–3.1)
Lymphs: 30 %
MCH: 26.6 pg (ref 26.6–33.0)
MCHC: 31.1 g/dL — AB (ref 31.5–35.7)
MCV: 86 fL (ref 79–97)
MONOCYTES: 9 %
MONOS ABS: 0.7 10*3/uL (ref 0.1–0.9)
Neutrophils Absolute: 4.3 10*3/uL (ref 1.4–7.0)
Neutrophils: 57 %
PLATELETS: 355 10*3/uL (ref 150–379)
RBC: 4.62 x10E6/uL (ref 3.77–5.28)
RDW: 16.4 % — AB (ref 12.3–15.4)
WBC: 7.5 10*3/uL (ref 3.4–10.8)

## 2016-09-23 LAB — TSH: TSH: 1.29 u[IU]/mL (ref 0.450–4.500)

## 2016-09-23 LAB — LIPID PANEL
CHOLESTEROL TOTAL: 294 mg/dL — AB (ref 100–199)
Chol/HDL Ratio: 4.7 ratio — ABNORMAL HIGH (ref 0.0–4.4)
HDL: 63 mg/dL (ref >39)
LDL CALC: 193 mg/dL — AB (ref 0–99)
TRIGLYCERIDES: 190 mg/dL — AB (ref 0–149)
VLDL CHOLESTEROL CAL: 38 mg/dL (ref 5–40)

## 2016-09-23 LAB — HIV ANTIBODY (ROUTINE TESTING W REFLEX): HIV SCREEN 4TH GENERATION: NONREACTIVE

## 2016-09-24 ENCOUNTER — Telehealth: Payer: Self-pay | Admitting: Internal Medicine

## 2016-09-24 ENCOUNTER — Ambulatory Visit: Payer: Self-pay | Attending: Internal Medicine

## 2016-09-24 ENCOUNTER — Encounter: Payer: Self-pay | Admitting: Internal Medicine

## 2016-09-24 NOTE — Telephone Encounter (Signed)
Pt came in to advise Dr Doreene Burke that she has received OC and is ready to start getting appts for requested referrals. Please f/u. Thank you.

## 2016-09-27 ENCOUNTER — Other Ambulatory Visit: Payer: Self-pay | Admitting: Internal Medicine

## 2016-09-27 MED ORDER — PRAVASTATIN SODIUM 40 MG PO TABS: 40.0000 mg | ORAL_TABLET | Freq: Every day | ORAL | 3 refills | Status: DC

## 2016-09-27 MED FILL — PRAVASTATIN NA 40 MG TAB: 40 | 30 days supply | Qty: 30 | Fill #0

## 2016-09-28 LAB — CYTOLOGY - PAP
Candida vaginitis: NEGATIVE
Chlamydia: NEGATIVE
DIAGNOSIS: NEGATIVE
HPV: NOT DETECTED
Neisseria Gonorrhea: NEGATIVE
Trichomonas: NEGATIVE

## 2016-09-29 ENCOUNTER — Encounter: Payer: Self-pay | Admitting: Internal Medicine

## 2016-09-29 NOTE — Telephone Encounter (Signed)
Please place order for mammogram screening

## 2016-09-30 ENCOUNTER — Other Ambulatory Visit: Payer: Self-pay | Admitting: Internal Medicine

## 2016-09-30 DIAGNOSIS — Z1239 Encounter for other screening for malignant neoplasm of breast: Secondary | ICD-10-CM

## 2016-09-30 NOTE — Telephone Encounter (Signed)
-----   Message from Tresa Garter, MD sent at 09/27/2016 11:09 AM EDT ----- Please inform patient that her lab results are mostly normal except for very high cholesterol level. Awaiting pap smear result, HIV screening was negative on 09/23/2016

## 2016-09-30 NOTE — Telephone Encounter (Signed)
Patient verified DOB Patient is aware of HIV being negative and PAP being negative for cancer or infection. Patient is aware of cholesterol being high and needing to start pravastatin. A recheck will be completed in 3 months. Patient is also aware and provided the number to the breast center to schedule her mammogram and [place]. No further questions at this time.

## 2016-10-11 ENCOUNTER — Other Ambulatory Visit: Payer: Self-pay | Admitting: Internal Medicine

## 2016-10-11 MED FILL — DICYCLOMINE 10 MG CAPSULE: 10 | 30 days supply | Qty: 120 | Fill #0

## 2016-10-11 MED FILL — ?CYCLOBENZAPRINE 10 MG TABL: 10 | 10 days supply | Qty: 30 | Fill #0

## 2016-10-11 MED FILL — OMEPRAZOLE 20 MG CAP: 20 | 30 days supply | Qty: 60 | Fill #8

## 2016-10-11 MED FILL — ONDANSETRON ODT 8 MG TABLET: 8 | 6 days supply | Qty: 20 | Fill #0

## 2016-10-11 MED FILL — GABAPENTIN 300 MG CAPSULE: 300 | 30 days supply | Qty: 90 | Fill #2

## 2016-10-11 MED FILL — ?DULOXETINE HCL DR 30 MG CA: 30 MG | 30 days supply | Qty: 60 | Fill #2

## 2016-10-11 MED FILL — AMLODIPINE BESYLATE 10 MG T: 10 | 30 days supply | Qty: 30 | Fill #6

## 2016-10-15 ENCOUNTER — Ambulatory Visit (HOSPITAL_COMMUNITY)
Admission: RE | Admit: 2016-10-15 | Discharge: 2016-10-15 | Disposition: A | Discharge status: Home / Self Care | Payer: Self-pay | Source: Ambulatory Visit | Attending: Internal Medicine | Admitting: Internal Medicine

## 2016-10-15 DIAGNOSIS — N921 Excessive and frequent menstruation with irregular cycle: Secondary | ICD-10-CM | POA: Insufficient documentation

## 2016-10-25 ENCOUNTER — Telehealth: Payer: Self-pay | Admitting: Internal Medicine

## 2016-10-25 NOTE — Telephone Encounter (Signed)
Pt called about her referral for [facility]. States that she needs the referral to state for her feet as well and would like to have the referral updated. Please f/u. Thank you.

## 2016-10-27 MED FILL — PRAVASTATIN NA 40 MG TAB: 40 | 30 days supply | Qty: 30 | Fill #1

## 2016-11-01 ENCOUNTER — Encounter (INDEPENDENT_AMBULATORY_CARE_PROVIDER_SITE_OTHER): Payer: Self-pay | Admitting: Orthopaedic Surgery

## 2016-11-01 ENCOUNTER — Ambulatory Visit (INDEPENDENT_AMBULATORY_CARE_PROVIDER_SITE_OTHER): Payer: Self-pay | Admitting: Orthopaedic Surgery

## 2016-11-01 DIAGNOSIS — M720 Palmar fascial fibromatosis [Dupuytren]: Secondary | ICD-10-CM

## 2016-11-01 NOTE — Progress Notes (Signed)
Office Visit Note   Patient: Isabel Evans           Date of Birth: August 13, 1970           MRN: [id number] Visit Date: 11/01/2016              Requested by: Isabel Garter, MD [address] [place], [place] [address] PCP: Isabel Garter, MD   Assessment & Plan: Visit Diagnoses:  1. Dupuytren's contracture of both hands     Plan: Referral to Dr. Jeronimo Evans hand surgery for Dupuytren's  Follow-Up Instructions: Return if symptoms worsen or fail to improve.   Orders:  Orders Placed This Encounter  Procedures  . Ambulatory referral to Orthopedic Surgery   No orders of the defined types were placed in this encounter.     Procedures: No procedures performed   Clinical Data: No additional findings.   Subjective: Chief Complaint  Patient presents with  . Right Hand - Pain  . Left Hand - Pain    Isabel Evans comes in today for mainly her left hand Dupuytren's contracture. This is bothering her small finger. She is starting to drop things and she states that the tips of her fingers are numb. She denies any injuries. She is had nodules taken off of her PIP joints before.    Review of Systems  Constitutional: Negative.   HENT: Negative.   Eyes: Negative.   Respiratory: Negative.   Cardiovascular: Negative.   Endocrine: Negative.   Musculoskeletal: Negative.   Neurological: Negative.   Hematological: Negative.   Psychiatric/Behavioral: Negative.   All other systems reviewed and are negative.    Objective: Vital Signs: There were no vitals taken for this visit.  Physical Exam  Constitutional: She is oriented to person, place, and time. She appears well-developed and well-nourished.  HENT:  Head: Normocephalic and atraumatic.  Eyes: EOM are normal.  Neck: Neck supple.  Pulmonary/Chest: Effort normal.  Abdominal: Soft.  Neurological: She is alert and oriented to person, place, and time.  Skin: Skin is warm. Capillary refill takes less than 2 seconds.    Psychiatric: She has a normal mood and affect. Her behavior is normal. Judgment and thought content normal.  Nursing note and vitals reviewed.   Ortho Exam Left hand exam shows Dupuytren's contracture of the left hand with flexion contracture of the PIP joint of the small finger about 90. Specialty Comments:  No specialty comments available.  Imaging: No results found.   PMFS History: Patient Active Problem List   Diagnosis Date Noted  . Gastroesophageal reflux disease without esophagitis 09/22/2016  . Fatigue associated with anemia 09/22/2016  . Flexion contractures 09/22/2016  . Menorrhagia with irregular cycle 09/22/2016  . Generalized anxiety disorder 01/01/2016  . Dental abscess 01/01/2016  . Healthcare maintenance 05/08/2015  . Adjustment disorder with mixed anxiety and depressed mood 05/08/2015  . Stress at home 10/24/2014  . Irritable bowel syndrome 10/24/2014  . Anxiety state 10/24/2014  . Preop testing 09/19/2014  . Pap smear for cervical cancer screening 09/19/2014  . Midline low back pain without sciatica 08/12/2014  . [place] nodes 08/12/2014  . Contracture of joint, hand 08/12/2014  . Essential hypertension 02/11/2014  . Allergy 02/11/2014  . Screening for breast cancer 02/11/2014  . LGSIL (low grade squamous intraepithelial dysplasia) 11/23/2013  . Dupuytren's contracture of both hands 11/15/2013  . Hallux valgus of right foot 11/15/2013  . Back pain 05/21/2013  . UTI (urinary tract infection) 05/21/2013  . Multiple skin nodules  11/27/2012  . Fibromyalgia 09/08/2012  . Osteoarthritis 09/08/2012  . Hypertension 07/14/2012  . Neck muscle spasm 07/14/2012  . Plantar wart 07/14/2012  . Insomnia 07/14/2012   Past Medical History:  Diagnosis Date  . Bone spur    cervical spine  . Deviated nasal septum    states is unable to lie flat, because her nose will become congested and she can stop breathing  . Fibromyalgia   . GERD (gastroesophageal reflux  disease)   . Hallux abductovalgus with bunions 09/2014   right   . Hammertoe 09/2014   right 4th, 5th  . Hypertension    states is under control with med., has been on med. x 8 mos.  . IBS (irritable bowel syndrome)    no current med.  . Osteoarthritis    hands  . Sinus headache     Family History  Problem Relation Age of Onset  . Hypertension Mother   . Diabetes Mother   . Heart disease Mother   . Hypertension Father   . Diabetes Father   . Prostate cancer Father   . Heart disease Father   . Alcohol abuse Father   . Breast cancer Sister   . Hypertension Sister   . Diabetes Sister   . Heart disease Maternal Grandmother        Great GM  . Breast cancer Maternal Grandmother   . Bone cancer Maternal Grandmother   . Breast cancer Maternal Aunt   . Ovarian cancer Maternal Aunt   . Colon cancer Maternal Aunt        great aunt  . Rheum arthritis Sister   . Colon cancer Son     Past Surgical History:  Procedure Laterality Date  . BUNIONECTOMY Right 10/04/2014   Procedure:  Isabel Evans, Isabel Evans;  Surgeon: Isabel Evans, DPM;  Location: [facility];  Service: Podiatry;  Laterality: Right;  . COLONOSCOPY WITH PROPOFOL  02/13/2013  . DUPUYTREN / PALMAR FASCIOTOMY Right 07/18/2013   exc. of multiple nodules  . 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 HAMMER TOE SURGERY Right 10/04/2014   Procedure: HAMMER TOE REPAIR 4TH  AND 5TH  RIGHT FOOT  fourth toe fixation with k wire;  Surgeon: Isabel Evans, DPM;  Location: [facility];  Service: Podiatry;  Laterality: Right;  . 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 TUBAL LIGATION     Social History   Occupational History  . DRIVER/CSR [employer]   Social History Main Topics  . Smoking status: Former Smoker    Quit date: 08/24/2012  . Smokeless tobacco: Never Used  . Alcohol use Yes     Comment: occasionally  . Drug use: No  . Sexual  activity: Yes    Birth control/ protection: Surgical

## 2016-11-03 ENCOUNTER — Other Ambulatory Visit: Payer: Self-pay | Admitting: Internal Medicine

## 2016-11-03 DIAGNOSIS — F411 Generalized anxiety disorder: Secondary | ICD-10-CM

## 2016-11-03 MED FILL — ?OMEPRAZOLE DR 20 MG CAPSUL: 20 | 30 days supply | Qty: 60 | Fill #9

## 2016-11-03 MED FILL — FLUTICASONE PROP 50 MCG SPR: 50 | 30 days supply | Qty: 16 | Fill #3

## 2016-11-03 MED FILL — ?CYCLOBENZAPRINE 10 MG TABL: 10 | 10 days supply | Qty: 30 | Fill #1

## 2016-11-03 MED FILL — ?DULOXETINE HCL DR 30 MG CA: 30 MG | 30 days supply | Qty: 60 | Fill #3

## 2016-11-03 MED FILL — GABAPENTIN 300 MG CAPSULE: 300 | 30 days supply | Qty: 90 | Fill #3

## 2016-11-03 MED FILL — ?AMLODIPINE BESYLATE 10 MG: 10 | 30 days supply | Qty: 30 | Fill #7

## 2016-11-03 NOTE — Telephone Encounter (Signed)
Pt called to request a refill for  ondansetron (ZOFRAN-ODT) 8 MG disintegrating tablet clonazePAM (KLONOPIN) 0.5 MG tablet  Please if you auth refill let the Pt know, please follow up

## 2016-11-04 MED FILL — ONDANSETRON ODT 8 MG TABLET: 8 | 6 days supply | Qty: 20 | Fill #0

## 2016-11-05 MED FILL — clonazePAM 0.5 MG TABS: 0.5 | 30 days supply | Qty: 60 | Fill #0

## 2016-11-10 ENCOUNTER — Ambulatory Visit: Payer: Self-pay | Attending: Internal Medicine | Admitting: Internal Medicine

## 2016-11-10 ENCOUNTER — Encounter: Payer: Self-pay | Admitting: Internal Medicine

## 2016-11-10 VITALS — BP 105/74 | HR 85 | Temp 98.1°F | Resp 18 | Ht 69.0 in | Wt 183.2 lb

## 2016-11-10 DIAGNOSIS — R233 Spontaneous ecchymoses: Secondary | ICD-10-CM

## 2016-11-10 DIAGNOSIS — R140 Abdominal distension (gaseous): Secondary | ICD-10-CM | POA: Insufficient documentation

## 2016-11-10 DIAGNOSIS — I10 Essential (primary) hypertension: Secondary | ICD-10-CM | POA: Insufficient documentation

## 2016-11-10 DIAGNOSIS — F4323 Adjustment disorder with mixed anxiety and depressed mood: Secondary | ICD-10-CM | POA: Insufficient documentation

## 2016-11-10 DIAGNOSIS — M797 Fibromyalgia: Secondary | ICD-10-CM | POA: Insufficient documentation

## 2016-11-10 DIAGNOSIS — R238 Other skin changes: Secondary | ICD-10-CM | POA: Insufficient documentation

## 2016-11-10 DIAGNOSIS — Z79899 Other long term (current) drug therapy: Secondary | ICD-10-CM | POA: Insufficient documentation

## 2016-11-10 DIAGNOSIS — K589 Irritable bowel syndrome without diarrhea: Secondary | ICD-10-CM | POA: Insufficient documentation

## 2016-11-10 DIAGNOSIS — K219 Gastro-esophageal reflux disease without esophagitis: Secondary | ICD-10-CM | POA: Insufficient documentation

## 2016-11-10 DIAGNOSIS — M549 Dorsalgia, unspecified: Secondary | ICD-10-CM | POA: Insufficient documentation

## 2016-11-10 LAB — POCT URINALYSIS DIPSTICK
BILIRUBIN UA: NEGATIVE
Glucose, UA: NEGATIVE
Ketones, UA: NEGATIVE
Leukocytes, UA: NEGATIVE
NITRITE UA: NEGATIVE
PH UA: 7 (ref 5.0–8.0)
Protein, UA: NEGATIVE
RBC UA: NEGATIVE
SPEC GRAV UA: 1.015 (ref 1.010–1.025)
Urobilinogen, UA: 0.2 E.U./dL

## 2016-11-10 MED ORDER — CYCLOBENZAPRINE HCL 10 MG PO TABS: 10.0000 mg | ORAL_TABLET | Freq: Three times a day (TID) | ORAL | 1 refills | Status: DC | PRN

## 2016-11-10 MED ORDER — DICYCLOMINE HCL 10 MG PO CAPS: 10.0000 mg | ORAL_CAPSULE | Freq: Three times a day (TID) | ORAL | 1 refills | Status: DC

## 2016-11-10 MED ORDER — DULOXETINE HCL 30 MG PO CPEP: ORAL_CAPSULE | ORAL | 3 refills | Status: DC

## 2016-11-10 NOTE — Patient Instructions (Signed)

## 2016-11-10 NOTE — Progress Notes (Signed)
Patient has not eaten  Patient has taken medication  Patient said she has night sweats

## 2016-11-10 NOTE — Progress Notes (Signed)
Isabel Evans, is a 46 y.o. female  FXT:[id number]  GDJ:[id number]  DOB - 07/04/1970  Chief Complaint  Patient presents with  . Back Pain       Subjective:   Isabel Evans is a 46 y.o. female with history of irritable bowel syndrome, fibromyalgia, major depression and anxiety, hypertension and GERD here today for a follow up visit. She has multiple complaints today. First, he complains of easy bruising and multiple bruises all over her body, no inciting factor known to patient, she will wake up and see new bruise on her limbs and lower limbs for example. She is not on any new medication. She has no trauma. No fall. She is also complaining of bloating, she feels full but no nausea, no vomiting or diarrhea, no constipation. No change in diet. Anxiety and depression appears controlled on current medications but she need refills on her medications. She denies any suicidal ideation or thoughts. Patient has No headache, No chest pain, No new weakness tingling or numbness, No Cough - SOB.  Problem  Bruises Easily    ALLERGIES: No Known Allergies  PAST MEDICAL HISTORY: Past Medical History:  Diagnosis Date  . Bone spur    cervical spine  . Deviated nasal septum    states is unable to lie flat, because her nose will become congested and she can stop breathing  . Fibromyalgia   . GERD (gastroesophageal reflux disease)   . Hallux abductovalgus with bunions 09/2014   right   . Hammertoe 09/2014   right 4th, 5th  . Hypertension    states is under control with med., has been on med. x 8 mos.  . IBS (irritable bowel syndrome)    no current med.  . Osteoarthritis    hands  . Sinus headache     MEDICATIONS AT HOME: Prior to Admission medications   Medication Sig Start Date End Date Taking? Authorizing Provider  amLODipine (NORVASC) 10 MG tablet Take 1 tablet (10 mg total) by mouth daily. 09/22/16  Yes Mattew Chriswell E, MD  clonazePAM (KLONOPIN) 0.5 MG tablet TAKE 1 TABLET BY MOUTH  TWO TIMES PER DAY AS NEEDED FOR ANXIETY 11/03/16  Yes Karsten Howry E, MD  cyclobenzaprine (FLEXERIL) 10 MG tablet Take 1 tablet (10 mg total) by mouth 3 (three) times daily as needed. for muscle spams 11/10/16  Yes Alper Guilmette, Marlena Clipper, MD  dicyclomine (BENTYL) 10 MG capsule Take 1 capsule (10 mg total) by mouth 4 (four) times daily -  before meals and at bedtime. 11/10/16  Yes Tresa Garter, MD  diphenhydrAMINE (BENADRYL) 25 MG tablet Take 1 tablet (25 mg total) by mouth every 8 (eight) hours as needed. 03/04/16  Yes Vonn Sliger E, MD  DULoxetine (CYMBALTA) 30 MG capsule TAKE 1 CAPSULE BY MOUTH 2 TIMES DAILY 11/10/16  Yes Alexica Schlossberg E, MD  fluticasone (FLONASE) 50 MCG/ACT nasal spray Place 2 sprays into both nostrils daily. 09/22/16  Yes Tresa Garter, MD  gabapentin (NEURONTIN) 300 MG capsule Take 1 capsule (300 mg total) by mouth 3 (three) times daily. 09/22/16  Yes Tresa Garter, MD  omeprazole (PRILOSEC) 20 MG capsule Take 2 capsules (40 mg total) by mouth daily. 09/22/16  Yes Iver Miklas E, MD  ondansetron (ZOFRAN-ODT) 8 MG disintegrating tablet TAKE 1 TABLET BY MOUTH EVERY 8 HOURS AS NEEDED FOR NAUSEA OR VOMITING 11/03/16  Yes Sequita Wise E, MD  pravastatin (PRAVACHOL) 40 MG tablet Take 1 tablet (40 mg total) by mouth daily. 09/27/16  Yes Tresa Garter, MD    Objective:   Vitals:   11/10/16 1130  BP: 105/74  Pulse: 85  Resp: 18  Temp: 98.1 F (36.7 C)  TempSrc: Oral  SpO2: 99%  Weight: 183 lb 3.2 oz (83.1 kg)  Height: 5\' 9"  (1.753 m)   Exam General appearance : Awake, alert, not in any distress. Speech Clear. Not toxic looking HEENT: Atraumatic and Normocephalic, pupils equally reactive to light and accomodation Neck: Supple, no JVD. No cervical lymphadenopathy.  Chest: Good air entry bilaterally, no added sounds  CVS: S1 S2 regular, no murmurs.  Abdomen: Bowel sounds present, Non tender and not distended with no gaurding,  rigidity or rebound. Extremities: B/L Lower Ext shows no edema, both legs are warm to touch Neurology: Awake alert, and oriented X 3, CN II-XII intact, Non focal Skin: Multiple bruises mostly noticeable upper limbs.  Data Review Lab Results  Component Value Date   HGBA1C 5.7 11/27/2015    Assessment & Plan   1. Essential hypertension  We have discussed target BP range and blood pressure goal. I have advised patient to check BP regularly and to call us back or report to clinic if the numbers are consistently higher than 140/90. We discussed the importance of compliance with medical therapy and DASH diet recommended, consequences of uncontrolled hypertension discussed.  - continue current BP medications  2. Bruises easily  - Protime-INR - APTT - Hepatic function panel - CBC & Diff and Retic  3. Abdominal bloating  - dicyclomine (BENTYL) 10 MG capsule; Take 1 capsule (10 mg total) by mouth 4 (four) times daily -  before meals and at bedtime.  Dispense: 120 capsule; Refill: 1  4. Adjustment disorder with mixed anxiety and depressed mood  - cyclobenzaprine (FLEXERIL) 10 MG tablet; Take 1 tablet (10 mg total) by mouth 3 (three) times daily as needed. for muscle spams  Dispense: 60 tablet; Refill: 1 - DULoxetine (CYMBALTA) 30 MG capsule; TAKE 1 CAPSULE BY MOUTH 2 TIMES DAILY  Dispense: 60 capsule; Refill: 3  Patient have been counseled extensively about nutrition and exercise. Other issues discussed during this visit include: low cholesterol diet, weight control and daily exercise, foot care, annual eye examinations at Ophthalmology, importance of adherence with medications and regular follow-up. We also discussed long term complications of uncontrolled diabetes and hypertension.   Return in about 3 months (around 02/10/2017) for Generalized Anxiety Disorder, Follow up HTN.  The patient was given clear instructions to go to ER or return to medical center if symptoms don't improve,  worsen or new problems develop. The patient verbalized understanding. The patient was told to call to get lab results if they haven't heard anything in the next week.   This note has been created with Surveyor, quantity. Any transcriptional errors are unintentional.    Angelica Chessman, MD, [place], [place], [place], [place] and [place], [place]   11/10/2016, 12:13 PM

## 2016-11-11 LAB — HEPATIC FUNCTION PANEL
ALBUMIN: 4.3 g/dL (ref 3.5–5.5)
ALK PHOS: 78 IU/L (ref 39–117)
ALT: 10 IU/L (ref 0–32)
AST: 17 IU/L (ref 0–40)
Bilirubin Total: <0.2 mg/dL (ref 0.0–1.2)
Bilirubin, Direct: 0.02 mg/dL (ref 0.00–0.40)
Total Protein: 6.8 g/dL (ref 6.0–8.5)

## 2016-11-11 LAB — CBC WITH DIFFERENTIAL/PLATELET
BASOS: 1 %
Basophils Absolute: 0 10*3/uL (ref 0.0–0.2)
EOS (ABSOLUTE): 0.3 10*3/uL (ref 0.0–0.4)
Eos: 6 %
Hematocrit: 37.5 % (ref 34.0–46.6)
Hemoglobin: 12 g/dL (ref 11.1–15.9)
IMMATURE GRANS (ABS): 0 10*3/uL (ref 0.0–0.1)
IMMATURE GRANULOCYTES: 0 %
LYMPHS: 37 %
Lymphocytes Absolute: 2.1 10*3/uL (ref 0.7–3.1)
MCH: 27.3 pg (ref 26.6–33.0)
MCHC: 32 g/dL (ref 31.5–35.7)
MCV: 85 fL (ref 79–97)
MONOS ABS: 0.5 10*3/uL (ref 0.1–0.9)
Monocytes: 8 %
NEUTROS PCT: 48 %
Neutrophils Absolute: 2.9 10*3/uL (ref 1.4–7.0)
Platelets: 354 10*3/uL (ref 150–379)
RBC: 4.39 x10E6/uL (ref 3.77–5.28)
RDW: 16.8 % — AB (ref 12.3–15.4)
WBC: 5.8 10*3/uL (ref 3.4–10.8)

## 2016-11-11 LAB — RETICULOCYTES: Retic Ct Pct: 1.3 % (ref 0.6–2.6)

## 2016-11-11 LAB — PROTIME-INR
INR: 0.9 (ref 0.8–1.2)
Prothrombin Time: 9.8 s (ref 9.1–12.0)

## 2016-11-11 LAB — APTT: APTT: 26 s (ref 24–33)

## 2016-11-11 NOTE — Telephone Encounter (Signed)
Patient verified DOB Patient is aware of pelvic US being normal. No further questions at this time. Patient made aware during OV on 11/10/16 that request for klonopin was not appropriate at this time. No further questions.

## 2016-11-11 NOTE — Telephone Encounter (Signed)
-----   Message from Tresa Garter, MD sent at 10/15/2016  4:43 PM EDT ----- Please inform patient that the pelvic ultrasound is normal.

## 2016-11-22 ENCOUNTER — Ambulatory Visit (HOSPITAL_BASED_OUTPATIENT_CLINIC_OR_DEPARTMENT_OTHER): Payer: Self-pay | Attending: Internal Medicine | Admitting: Internal Medicine

## 2016-11-22 VITALS — Ht 71.0 in | Wt 183.0 lb

## 2016-11-22 DIAGNOSIS — R0683 Snoring: Secondary | ICD-10-CM | POA: Insufficient documentation

## 2016-11-22 DIAGNOSIS — D649 Anemia, unspecified: Secondary | ICD-10-CM

## 2016-11-22 DIAGNOSIS — G4700 Insomnia, unspecified: Secondary | ICD-10-CM | POA: Insufficient documentation

## 2016-11-23 ENCOUNTER — Other Ambulatory Visit: Payer: Self-pay | Admitting: Internal Medicine

## 2016-11-24 MED FILL — ONDANSETRON ODT 8 MG TABLET: 8 | 6 days supply | Qty: 20 | Fill #0

## 2016-11-26 ENCOUNTER — Telehealth (INDEPENDENT_AMBULATORY_CARE_PROVIDER_SITE_OTHER): Payer: Self-pay | Admitting: Orthopaedic Surgery

## 2016-11-26 NOTE — Telephone Encounter (Signed)
Patient called stating that the practice she was referred to does not except the orange card/100% discount and was wondering if she could be referred somewhere else. Thank you. CB # [phone]

## 2016-11-28 DIAGNOSIS — D649 Anemia, unspecified: Secondary | ICD-10-CM

## 2016-11-28 NOTE — Procedures (Signed)
  Patient Name: Isabel Evans, Isabel Evans Date: 11/22/2016 Gender: Female D.O.B: 1970-06-26 Age (years): 46 Referring Provider: Angelica Chessman Height (inches): 71 Interpreting Physician: Baird Lyons MD, ABSM Weight (lbs): 183 RPSGT: Jorge Ny BMI: 26 MRN: [id number] Neck Size: 14.00 CLINICAL INFORMATION Sleep Study Type: NPSG  Indication for sleep study: Fatigue, Snoring  Epworth Sleepiness Score: 5  SLEEP STUDY TECHNIQUE As per the AASM Manual for the Scoring of Sleep and Associated Events v2.3 (April 2016) with a hypopnea requiring 4% desaturations.  The channels recorded and monitored were frontal, central and occipital EEG, electrooculogram (EOG), submentalis EMG (chin), nasal and oral airflow, thoracic and abdominal wall motion, anterior tibialis EMG, snore microphone, electrocardiogram, and pulse oximetry.  MEDICATIONS Medications self-administered by patient taken the night of the study : none reported  SLEEP ARCHITECTURE The study was initiated at 10:49:22 PM and ended at 5:09:09 AM.  Sleep onset time was 14.4 minutes and the sleep efficiency was 81.0%. The total sleep time was 307.5 minutes.  Stage REM latency was 79.5 minutes.  The patient spent 9.43% of the night in stage N1 sleep, 86.99% in stage N2 sleep, 0.00% in stage N3 and 3.58% in REM.  Alpha intrusion was absent.  Supine sleep was 22.93%.  RESPIRATORY PARAMETERS The overall apnea/hypopnea index (AHI) was 0.2 per hour. There were 1 total apneas, including 1 obstructive, 0 central and 0 mixed apneas. There were 0 hypopneas and 11 RERAs.  The AHI during Stage REM sleep was 0.0 per hour.  AHI while supine was 0.0 per hour.  The mean oxygen saturation was 93.22%. The minimum SpO2 during sleep was 89.00%.  Moderate snoring was noted during this study.  CARDIAC DATA The 2 lead EKG demonstrated sinus rhythm. The mean heart rate was 82.77 beats per minute. Other EKG findings include:  None.  LEG MOVEMENT DATA The total PLMS were 45 with a resulting PLMS index of 8.78. Associated arousal with leg movement index was 0.2 .  IMPRESSIONS - No significant obstructive sleep apnea occurred during this study (AHI = 0.2/h). - No significant central sleep apnea occurred during this study (CAI = 0.0/h). - The patient had minimal or no oxygen desaturation during the study (Min O2 = 89.00%) - The patient snored with Moderate snoring volume. - No cardiac abnormalities were noted during this study. - Mild periodic limb movements of sleep occurred during the study. No significant associated arousals. - Patient had frequent brief arousals and awakenings with difficulty maintaining sleep.  DIAGNOSIS - Primary Snoring (786.09 [R06.83 ICD-10]) - Insomnia ( G47.00)  RECOMMENDATIONS - Be careful with alcohol, sedatives and other CNS depressants that may worsen sleep apnea and disrupt normal sleep architecture. - Sleep hygiene should be reviewed to assess factors that may improve sleep quality. - Weight management and regular exercise should be initiated or continued if appropriate.  [Electronically signed] 11/28/2016 11:17 AM  Baird Lyons MD, ABSM Diplomate, American Board of Sleep Medicine   NPI: [id number]  [place], American Board of Sleep Medicine  ELECTRONICALLY SIGNED ON:  11/28/2016, 11:18 AM [place] PH: [phone]   FX: [phone] [place]

## 2016-11-30 NOTE — Telephone Encounter (Signed)
Called patient no answer, LMOM to return call

## 2016-12-02 ENCOUNTER — Telehealth (INDEPENDENT_AMBULATORY_CARE_PROVIDER_SITE_OTHER): Payer: Self-pay | Admitting: Orthopaedic Surgery

## 2016-12-02 ENCOUNTER — Telehealth: Payer: Self-pay | Admitting: *Deleted

## 2016-12-02 NOTE — Telephone Encounter (Signed)
Patient returned call asked for a call back    The number to contact patient is [phone]

## 2016-12-02 NOTE — Telephone Encounter (Signed)
Patient verified DOB Patient is aware of Sleep study showing no OSA. Patient is advised to not take antidepressants and increase exercise as tolerated. Patient expressed her understanding and had no further questions.

## 2016-12-02 NOTE — Telephone Encounter (Signed)
-----   Message from Tresa Garter, MD sent at 11/16/2016  7:48 PM EDT ----- Results are all normal. No laboratory explanation for easy bruising. Will continue to monitor

## 2016-12-02 NOTE — Telephone Encounter (Signed)
Okay for her to do PT? Didn't see anything on your notes "plan"

## 2016-12-02 NOTE — Telephone Encounter (Signed)
Yes that's fine 

## 2016-12-03 ENCOUNTER — Other Ambulatory Visit: Payer: Self-pay | Admitting: Internal Medicine

## 2016-12-03 DIAGNOSIS — F411 Generalized anxiety disorder: Secondary | ICD-10-CM

## 2016-12-03 MED FILL — DICYCLOMINE 10 MG CAPSULE: 10 | 30 days supply | Qty: 120 | Fill #0

## 2016-12-03 MED FILL — CYCLOBENZAPRINE 10 MG TAB: 10 | 20 days supply | Qty: 60 | Fill #0

## 2016-12-03 MED FILL — ?DULOXETINE HCL DR 30 MG CA: 30 MG | 30 days supply | Qty: 60 | Fill #0

## 2016-12-03 MED FILL — ?OMEPRAZOLE DR 20 MG CAPSUL: 20 | 30 days supply | Qty: 60 | Fill #10

## 2016-12-03 MED FILL — AMLODIPINE BESYLATE 10 MG T: 10 | 30 days supply | Qty: 30 | Fill #8

## 2016-12-03 MED FILL — PRAVASTATIN NA 40 MG TAB: 40 | 30 days supply | Qty: 30 | Fill #2

## 2016-12-03 MED FILL — FLUTICASONE PROP 50 MCG SPR: 50 | 30 days supply | Qty: 16 | Fill #0

## 2016-12-03 NOTE — Telephone Encounter (Signed)
Returned call. Spoke to patient

## 2016-12-03 NOTE — Telephone Encounter (Signed)
Called patient ans she states she needs another referral to MD for her left hand since Dr Burney Gauze does not accept her ins.  Called Sabrina (Referral) so that she can refer pt elsewhere

## 2016-12-06 NOTE — Progress Notes (Signed)
Patient was made aware of these results on 12/02/16.

## 2016-12-09 ENCOUNTER — Other Ambulatory Visit: Payer: Self-pay | Admitting: Internal Medicine

## 2016-12-09 DIAGNOSIS — F411 Generalized anxiety disorder: Secondary | ICD-10-CM

## 2016-12-12 ENCOUNTER — Other Ambulatory Visit: Payer: Self-pay | Admitting: Internal Medicine

## 2016-12-12 DIAGNOSIS — F411 Generalized anxiety disorder: Secondary | ICD-10-CM

## 2016-12-13 ENCOUNTER — Inpatient Hospital Stay (HOSPITAL_COMMUNITY)
Admission: AD | Admit: 2016-12-13 | Discharge: 2016-12-13 | Disposition: A | Discharge status: Home / Self Care | Payer: Self-pay | Source: Ambulatory Visit | Attending: Obstetrics and Gynecology | Admitting: Obstetrics and Gynecology

## 2016-12-13 ENCOUNTER — Encounter (HOSPITAL_COMMUNITY): Payer: Self-pay | Admitting: *Deleted

## 2016-12-13 DIAGNOSIS — Z811 Family history of alcohol abuse and dependence: Secondary | ICD-10-CM | POA: Insufficient documentation

## 2016-12-13 DIAGNOSIS — Z87891 Personal history of nicotine dependence: Secondary | ICD-10-CM | POA: Insufficient documentation

## 2016-12-13 DIAGNOSIS — Z803 Family history of malignant neoplasm of breast: Secondary | ICD-10-CM | POA: Insufficient documentation

## 2016-12-13 DIAGNOSIS — Z79899 Other long term (current) drug therapy: Secondary | ICD-10-CM | POA: Insufficient documentation

## 2016-12-13 DIAGNOSIS — Z8041 Family history of malignant neoplasm of ovary: Secondary | ICD-10-CM | POA: Insufficient documentation

## 2016-12-13 DIAGNOSIS — K219 Gastro-esophageal reflux disease without esophagitis: Secondary | ICD-10-CM | POA: Insufficient documentation

## 2016-12-13 DIAGNOSIS — Z9889 Other specified postprocedural states: Secondary | ICD-10-CM | POA: Insufficient documentation

## 2016-12-13 DIAGNOSIS — Z8042 Family history of malignant neoplasm of prostate: Secondary | ICD-10-CM | POA: Insufficient documentation

## 2016-12-13 DIAGNOSIS — Z833 Family history of diabetes mellitus: Secondary | ICD-10-CM | POA: Insufficient documentation

## 2016-12-13 DIAGNOSIS — Z8249 Family history of ischemic heart disease and other diseases of the circulatory system: Secondary | ICD-10-CM | POA: Insufficient documentation

## 2016-12-13 DIAGNOSIS — N939 Abnormal uterine and vaginal bleeding, unspecified: Secondary | ICD-10-CM

## 2016-12-13 DIAGNOSIS — K589 Irritable bowel syndrome without diarrhea: Secondary | ICD-10-CM | POA: Insufficient documentation

## 2016-12-13 DIAGNOSIS — Z800 Family history of malignant neoplasm of digestive organs: Secondary | ICD-10-CM | POA: Insufficient documentation

## 2016-12-13 DIAGNOSIS — Z8261 Family history of arthritis: Secondary | ICD-10-CM | POA: Insufficient documentation

## 2016-12-13 DIAGNOSIS — M797 Fibromyalgia: Secondary | ICD-10-CM | POA: Insufficient documentation

## 2016-12-13 DIAGNOSIS — I10 Essential (primary) hypertension: Secondary | ICD-10-CM | POA: Insufficient documentation

## 2016-12-13 DIAGNOSIS — N920 Excessive and frequent menstruation with regular cycle: Secondary | ICD-10-CM | POA: Insufficient documentation

## 2016-12-13 DIAGNOSIS — M1990 Unspecified osteoarthritis, unspecified site: Secondary | ICD-10-CM | POA: Insufficient documentation

## 2016-12-13 LAB — CBC
HCT: 32 % — ABNORMAL LOW (ref 36.0–46.0)
HEMOGLOBIN: 10.5 g/dL — AB (ref 12.0–15.0)
MCH: 27.5 pg (ref 26.0–34.0)
MCHC: 32.8 g/dL (ref 30.0–36.0)
MCV: 83.8 fL (ref 78.0–100.0)
PLATELETS: 308 10*3/uL (ref 150–400)
RBC: 3.82 MIL/uL — AB (ref 3.87–5.11)
RDW: 16.3 % — ABNORMAL HIGH (ref 11.5–15.5)
WBC: 7.6 10*3/uL (ref 4.0–10.5)

## 2016-12-13 LAB — GC/CHLAMYDIA PROBE AMP (~~LOC~~) NOT AT ARMC
Chlamydia: NEGATIVE
Neisseria Gonorrhea: NEGATIVE

## 2016-12-13 LAB — WET PREP, GENITAL
CLUE CELLS WET PREP: NONE SEEN
SPERM: NONE SEEN
TRICH WET PREP: NONE SEEN
Yeast Wet Prep HPF POC: NONE SEEN

## 2016-12-13 LAB — URINALYSIS, ROUTINE W REFLEX MICROSCOPIC
Bacteria, UA: NONE SEEN
Bilirubin Urine: NEGATIVE
GLUCOSE, UA: NEGATIVE mg/dL
KETONES UR: NEGATIVE mg/dL
Leukocytes, UA: NEGATIVE
NITRITE: NEGATIVE
PH: 5 (ref 5.0–8.0)
PROTEIN: NEGATIVE mg/dL
SPECIFIC GRAVITY, URINE: 1.011 (ref 1.005–1.030)
Squamous Epithelial / LPF: NONE SEEN

## 2016-12-13 LAB — POCT PREGNANCY, URINE: Preg Test, Ur: NEGATIVE

## 2016-12-13 MED ORDER — MEGESTROL ACETATE 40 MG PO TABS: 40.0000 mg | ORAL_TABLET | Freq: Three times a day (TID) | ORAL | 0 refills | Status: DC

## 2016-12-13 MED ORDER — MEGESTROL ACETATE 40 MG PO TABS
40.0000 mg | ORAL_TABLET | Freq: Once | ORAL | Status: AC
  Administered 2016-12-13: 40 mg via ORAL
  Filled 2016-12-13: qty 1

## 2016-12-13 MED FILL — clonazePAM 0.5 MG TABS: 0.5 | 30 days supply | Qty: 60 | Fill #0

## 2016-12-13 MED FILL — MEGESTROL 40 MG TABLET: 40 | 15 days supply | Qty: 45 | Fill #0

## 2016-12-13 NOTE — Discharge Instructions (Signed)

## 2016-12-13 NOTE — MAU Provider Note (Signed)
History     CSN: [id number]  Arrival date and time: 12/13/16 3151   First Provider Initiated Contact with Patient 12/13/16 [phone]      Chief Complaint  Patient presents with  . Vaginal Bleeding    HPI: Isabel Evans is a 46 y.o. [id number] who presents to maternity admissions reporting heavy vaginal bleeding. She has history of menorrhagia, but reports she has never bleeding like today. She reports having regular menses and menses started a day ago, and became very heavy tonight. She reports using 10+ tampons since 8:30pm tonight. Reports passing some heavy clots. Reports feeling somewhat week, but denies dizziness, SOB, DOE, chest pain. Also denies any fever, chills, GI or GU symptoms.   Past obstetric history: OB History  Gravida Para Term Preterm AB Living  3 3 3  0 0 3  SAB TAB Ectopic Multiple Live Births  0 0 0 0      # Outcome Date GA Lbr Len/2nd Weight Sex Delivery Anes PTL Lv  3 Term           2 Term           1 Term               Past Medical History:  Diagnosis Date  . Bone spur    cervical spine  . Deviated nasal septum    states is unable to lie flat, because her nose will become congested and she can stop breathing  . Fibromyalgia   . GERD (gastroesophageal reflux disease)   . Hallux abductovalgus with bunions 09/2014   right   . Hammertoe 09/2014   right 4th, 5th  . Hypertension    states is under control with med., has been on med. x 8 mos.  . IBS (irritable bowel syndrome)    no current med.  . Osteoarthritis    hands  . Sinus headache     Past Surgical History:  Procedure Laterality Date  . BUNIONECTOMY Right 10/04/2014   Procedure:  Altamese Wilson, Emmaline Life;  Surgeon: Trula Slade, DPM;  Location: [facility];  Service: Podiatry;  Laterality: Right;  . COLONOSCOPY WITH PROPOFOL  02/13/2013  . DUPUYTREN / PALMAR FASCIOTOMY Right 07/18/2013   exc. of multiple nodules  . DUPUYTREN CONTRACTURE RELEASE Left 07/18/2013    small finger  . HAMMER TOE SURGERY Right 10/04/2014   Procedure: HAMMER TOE REPAIR 4TH  AND 5TH  RIGHT FOOT  fourth toe fixation with k wire;  Surgeon: Trula Slade, DPM;  Location: [facility];  Service: Podiatry;  Laterality: Right;  . 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 TUBAL LIGATION      Family History  Problem Relation Age of Onset  . Hypertension Mother   . Diabetes Mother   . Heart disease Mother   . Hypertension Father   . Diabetes Father   . Prostate cancer Father   . Heart disease Father   . Alcohol abuse Father   . Breast cancer Sister   . Hypertension Sister   . Diabetes Sister   . Heart disease Maternal Grandmother        Great GM  . Breast cancer Maternal Grandmother   . Bone cancer Maternal Grandmother   . Breast cancer Maternal Aunt   . Ovarian cancer Maternal Aunt   . Colon cancer Maternal Aunt        great aunt  . Rheum arthritis Sister   .  Colon cancer Son     Social History  Substance Use Topics  . Smoking status: Former Smoker    Quit date: 08/24/2012  . Smokeless tobacco: Never Used  . Alcohol use Yes     Comment: occasionally    Allergies: No Known Allergies  Prescriptions Prior to Admission  Medication Sig Dispense Refill Last Dose  . amLODipine (NORVASC) 10 MG tablet Take 1 tablet (10 mg total) by mouth daily. 90 tablet 3 12/12/2016 at Unknown time  . clonazePAM (KLONOPIN) 0.5 MG tablet TAKE 1 TABLET BY MOUTH TWICE DAILY AS NEEDED FOR ANXIETY 60 tablet 0 12/12/2016 at Unknown time  . cyclobenzaprine (FLEXERIL) 10 MG tablet Take 1 tablet (10 mg total) by mouth 3 (three) times daily as needed. for muscle spams 60 tablet 1 12/12/2016 at Unknown time  . dicyclomine (BENTYL) 10 MG capsule Take 1 capsule (10 mg total) by mouth 4 (four) times daily -  before meals and at bedtime. 120 capsule 1 12/12/2016 at Unknown time  . diphenhydrAMINE (BENADRYL) 25 MG tablet Take 1 tablet (25 mg total) by mouth every 8  (eight) hours as needed. 30 tablet 0 Past Week at Unknown time  . DULoxetine (CYMBALTA) 30 MG capsule TAKE 1 CAPSULE BY MOUTH 2 TIMES DAILY 60 capsule 3 12/12/2016 at Unknown time  . fluticasone (FLONASE) 50 MCG/ACT nasal spray Place 2 sprays into both nostrils daily. 16 g 3 12/12/2016 at Unknown time  . gabapentin (NEURONTIN) 300 MG capsule Take 1 capsule (300 mg total) by mouth 3 (three) times daily. 270 capsule 3 12/12/2016 at Unknown time  . omeprazole (PRILOSEC) 20 MG capsule Take 2 capsules (40 mg total) by mouth daily. 180 capsule 3 12/12/2016 at Unknown time  . ondansetron (ZOFRAN-ODT) 8 MG disintegrating tablet TAKE 1 TABLET BY MOUTH EVERY 8 HOURS AS NEEDED FOR NAUSEA OR VOMITING 20 tablet 0 12/12/2016 at Unknown time  . pravastatin (PRAVACHOL) 40 MG tablet Take 1 tablet (40 mg total) by mouth daily. 90 tablet 3 12/12/2016 at Unknown time    Review of Systems - Negative except for what is mentioned in HPI.  Physical Exam   Blood pressure 123/80, pulse 91, temperature 98.1 F (36.7 C), temperature source Oral, resp. rate 19, height 5\' 11"  (1.803 m), weight 187 lb (84.8 kg).  Constitutional: Well-developed, well-nourished female in no acute distress.  HENT: [place]/AT. MMM Cardiovascular: normal rate, regular rythm Respiratory: normal effort, lungs CTAB.  GI: Abd soft, non-tender, non-distended Pelvic: NEFG, large amount of blood in vaginal vault, with moderate-sized clot removed, and blood cleaned with 4-5 large swabs. Normal appearing vaginal mucosa; no vaginal or cervical lesions, erosions or masses.  MSK: no edema, normal ROM Neurologic: Alert and oriented x 4. Psych: Normal mood and affect   MAU Course  Procedures  MDM Patient with heavy menstrual bleeding. Significant amount of blood noted on pelvic exam. Pt hemodynamically stable, normotensive, HR wnl.  CBC ordered -- Hgb/Hct 10.5/32.0 U/S from 10/15/16 reviewed - normal  Assessment and Plan  Assessment: 1. Abnormal uterine  bleeding (AUB)     Plan: --Rx for megace 40 mg TID given for acute management of heavy menstrual bleeding; instructed to decrease to daily. --Discussed return precautions, including worsening, uncontrolled bleeding, dizziness/presyncope, DOE.  --Follow up with GYN in about 2-4 weeks.  --Discharge home in stable condition.    Todd Jelinski, Jenne Pane, MD 12/13/2016 1:57 AM

## 2016-12-13 NOTE — MAU Note (Signed)
Pt presents to MAU c/o vaginal bleeding. Pt states that she started her period yesterday 12/11/16 and was having some spotting and then her bleeding increased today 12/12/16 and has continued thru the night. Pt states she has bled thru 12 tampons since 2030. Pt reports passing clots and having no odor. Pt did have intercourse before her period began.

## 2016-12-21 ENCOUNTER — Encounter (INDEPENDENT_AMBULATORY_CARE_PROVIDER_SITE_OTHER): Payer: Self-pay | Admitting: Orthopaedic Surgery

## 2016-12-21 ENCOUNTER — Encounter: Payer: Self-pay | Admitting: Internal Medicine

## 2016-12-22 NOTE — Telephone Encounter (Signed)
Do we have any options?

## 2016-12-27 ENCOUNTER — Other Ambulatory Visit: Payer: Self-pay | Admitting: Internal Medicine

## 2016-12-27 DIAGNOSIS — F4323 Adjustment disorder with mixed anxiety and depressed mood: Secondary | ICD-10-CM

## 2016-12-27 DIAGNOSIS — K219 Gastro-esophageal reflux disease without esophagitis: Secondary | ICD-10-CM

## 2016-12-27 MED FILL — AMLODIPINE BESYLATE 10 MG T: 10 | 30 days supply | Qty: 30 | Fill #9

## 2016-12-27 MED FILL — ONDANSETRON ODT 8 MG TABLET: 8 | 6 days supply | Qty: 20 | Fill #0

## 2016-12-27 MED FILL — ?OMEPRAZOLE DR 20 MG CAPSUL: 20 | 30 days supply | Qty: 60 | Fill #11

## 2016-12-27 MED FILL — ?DULOXETINE HCL DR 30 MG CA: 30 MG | 30 days supply | Qty: 60 | Fill #1

## 2016-12-27 MED FILL — GABAPENTIN 300 MG CAPSULE: 300 | 30 days supply | Qty: 90 | Fill #4

## 2016-12-27 MED FILL — ?CYCLOBENZAPRINE 10 MG TABL: 10 | 20 days supply | Qty: 60 | Fill #1

## 2016-12-27 MED FILL — ?PRAVASTATIN SODIUM 40MG TA: 40 | 30 days supply | Qty: 30 | Fill #3

## 2017-01-05 ENCOUNTER — Ambulatory Visit (INDEPENDENT_AMBULATORY_CARE_PROVIDER_SITE_OTHER): Payer: Self-pay | Admitting: Obstetrics and Gynecology

## 2017-01-05 ENCOUNTER — Telehealth: Payer: Self-pay | Admitting: Internal Medicine

## 2017-01-05 ENCOUNTER — Other Ambulatory Visit: Payer: Self-pay | Admitting: Internal Medicine

## 2017-01-05 ENCOUNTER — Encounter: Payer: Self-pay | Admitting: Obstetrics and Gynecology

## 2017-01-05 ENCOUNTER — Other Ambulatory Visit (HOSPITAL_COMMUNITY)
Admission: RE | Admit: 2017-01-05 | Discharge: 2017-01-05 | Disposition: A | Discharge status: Home / Self Care | Payer: Self-pay | Source: Ambulatory Visit | Attending: Obstetrics and Gynecology | Admitting: Obstetrics and Gynecology

## 2017-01-05 ENCOUNTER — Other Ambulatory Visit: Payer: Self-pay | Admitting: Family Medicine

## 2017-01-05 VITALS — BP 120/62 | HR 94 | Wt 184.0 lb

## 2017-01-05 DIAGNOSIS — K219 Gastro-esophageal reflux disease without esophagitis: Secondary | ICD-10-CM

## 2017-01-05 DIAGNOSIS — N939 Abnormal uterine and vaginal bleeding, unspecified: Secondary | ICD-10-CM

## 2017-01-05 DIAGNOSIS — Z3202 Encounter for pregnancy test, result negative: Secondary | ICD-10-CM

## 2017-01-05 DIAGNOSIS — F411 Generalized anxiety disorder: Secondary | ICD-10-CM

## 2017-01-05 LAB — POCT PREGNANCY, URINE: PREG TEST UR: NEGATIVE

## 2017-01-05 MED ORDER — MEGESTROL ACETATE 40 MG PO TABS: ORAL_TABLET | ORAL | 1 refills | Status: DC

## 2017-01-05 NOTE — Progress Notes (Signed)
Elevated PHQ9,states that she is seeing a therapist.

## 2017-01-05 NOTE — Procedures (Signed)
Endometrial Biopsy Procedure Note  Pre-operative Diagnosis: AUB  Post-operative Diagnosis: same   Procedure Details  Urine pregnancy test was done and result was negative.  The risks (including infection, bleeding, pain, and uterine perforation) and benefits of the procedure were explained to the patient and Written informed consent was obtained.  The patient was placed in the dorsal lithotomy position.  Bimanual exam showed the uterus to be in the neutral position.  A Graves' speculum inserted in the vagina. The cervix was then prepped with povidone iodine, and a sharp tenaculum was applied to the anterior lip of the cervix for stabilization.  A pipelle was inserted into the uterine cavity and sounded the uterus to a depth of 9cm.  A Small amount of tissue was collected after 1 passes. The sample was sent for pathologic examination.  Condition: Stable  Complications: None  Plan: The patient was advised to call for any fever or for prolonged or severe pain or bleeding. She was advised to use OTC analgesics as needed for mild to moderate pain. She was advised to avoid vaginal intercourse for 48 hours or until the bleeding has completely stopped.  Durene Romans MD Attending Center for [employer] [facility])

## 2017-01-05 NOTE — Progress Notes (Signed)
Obstetrics and Gynecology New Patient Evaluation  Appointment Date: 01/05/2017  OBGYN Clinic: [facility] for [facility]  Primary Care Provider: Tresa Evans  Referring Provider: MAU  Chief Complaint:  Chief Complaint  Patient presents with  . abnormal bleeding    History of Present Illness: Isabel Evans is a 46 y.o. African-American [phone] (Patient's last menstrual period was 12/11/2016 (approximate).), seen for the above chief complaint.   Patient seen in MAU 8/27 for heavier than normal vaginal bleeding during her period. She was put on megace. She states that it seemed to help and she's just having spotting.    No fevers, chills, chest pain, SOB, nausea, vomiting, abdominal pain, dysuria, hematuria, vaginal itching, consistent dyspareunia,  diarrhea, constipation, blood in BMs  +hot flashes and night sweats for the past few months  Review of Systems: as noted in the History of Present Illness.  Past Medical History:  Past Medical History:  Diagnosis Date  . Bone spur    cervical spine  . Deviated nasal septum    states is unable to lie flat, because her nose will become congested and she can stop breathing  . Fibromyalgia   . GERD (gastroesophageal reflux disease)   . Hallux abductovalgus with bunions 09/2014   right   . Hammertoe 09/2014   right 4th, 5th  . Hypertension    states is under control with med., has been on med. x 8 mos.  . IBS (irritable bowel syndrome)    no current med.  . Osteoarthritis    hands  . Sinus headache     Past Surgical History:  Past Surgical History:  Procedure Laterality Date  . BUNIONECTOMY Right 10/04/2014   Procedure:  Altamese Isabel Evans, Isabel Evans;  Surgeon: Isabel Evans, DPM;  Location: [place];  Service: Podiatry;  Laterality: Right;  . COLONOSCOPY WITH PROPOFOL  02/13/2013  . DUPUYTREN / PALMAR FASCIOTOMY Right 07/18/2013   exc. of multiple nodules  . DUPUYTREN  CONTRACTURE RELEASE Left 07/18/2013   small finger  . HAMMER TOE SURGERY Right 10/04/2014   Procedure: HAMMER TOE REPAIR 4TH  AND 5TH  RIGHT FOOT  fourth toe fixation with k wire;  Surgeon: Isabel Evans, DPM;  Location: [place];  Service: Podiatry;  Laterality: Right;  . 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 TUBAL LIGATION      Past Obstetrical History:  OB History  Gravida Para Term Preterm AB Living  3 3 3  0 0 3  SAB TAB Ectopic Multiple Live Births  0 0 0 0      # Outcome Date GA Lbr Len/2nd Weight Sex Delivery Anes PTL Lv  3 Term           2 Term           1 Term             Obstetric Comments  SVD x 3   Past Gynecological History: As per HPI. Periods: qmonth, regular, 3-5d. They have been getting more painful and heavier in the past few months. Sometimes has some bleeding after intercourse but no intermenstrual bleeding aside from this History of Pap Smear(s): Yes.   Last pap 2018, which was NILM/HPV neg She is currently using BTL for contraception.    Social History:  Social History   Social History  . Marital status: Single    Spouse name: N/A  . Number of children: 3  .  Years of education: N/A   Occupational History  . DRIVER/CSR [employer]   Social History Main Topics  . Smoking status: Former Smoker    Quit date: 08/24/2012  . Smokeless tobacco: Never Used  . Alcohol use Yes     Comment: occasionally  . Drug use: No  . Sexual activity: Yes    Birth control/ protection: Surgical   Other Topics Concern  . Not on file   Social History Narrative  . No narrative on file  She does vape   Family History:  Family History  Problem Relation Age of Onset  . Hypertension Mother   . Diabetes Mother   . Heart disease Mother   . Hypertension Father   . Diabetes Father   . Prostate cancer Father   . Heart disease Father   . Alcohol abuse Father   . Breast cancer Sister   . Hypertension Sister   . Diabetes Sister    . Heart disease Maternal Grandmother        Great GM  . Breast cancer Maternal Grandmother   . Bone cancer Maternal Grandmother   . Breast cancer Maternal Aunt   . Ovarian cancer Maternal Aunt   . Colon cancer Maternal Aunt        great aunt  . Rheum arthritis Sister   . Colon cancer Son   Breast cancer diagnosis in her 15s. Pt only endorsed her maternal aunt as having br cancer  Health Maintenance:  Mammogram(s): Yes.   Date: 06/2015, bi rads 1   Medications [name] had no medications administered during this visit. Current Outpatient Prescriptions  Medication Sig Dispense Refill  . amLODipine (NORVASC) 10 MG tablet Take 1 tablet (10 mg total) by mouth daily. 90 tablet 3  . clonazePAM (KLONOPIN) 0.5 MG tablet TAKE 1 TABLET BY MOUTH TWICE DAILY AS NEEDED FOR ANXIETY 60 tablet 0  . cyclobenzaprine (FLEXERIL) 10 MG tablet Take 1 tablet (10 mg total) by mouth 3 (three) times daily as needed. for muscle spams 60 tablet 1  . dicyclomine (BENTYL) 10 MG capsule Take 1 capsule (10 mg total) by mouth 4 (four) times daily -  before meals and at bedtime. 120 capsule 1  . diphenhydrAMINE (BENADRYL) 25 MG tablet Take 1 tablet (25 mg total) by mouth every 8 (eight) hours as needed. 30 tablet 0  . DULoxetine (CYMBALTA) 30 MG capsule TAKE 1 CAPSULE BY MOUTH 2 TIMES DAILY 60 capsule 3  . fluticasone (FLONASE) 50 MCG/ACT nasal spray Place 2 sprays into both nostrils daily. 16 g 3  . gabapentin (NEURONTIN) 300 MG capsule Take 1 capsule (300 mg total) by mouth 3 (three) times daily. 270 capsule 3  . omeprazole (PRILOSEC) 20 MG capsule Take 2 capsules (40 mg total) by mouth daily. 180 capsule 3  . ondansetron (ZOFRAN-ODT) 8 MG disintegrating tablet TAKE 1 TABLET BY MOUTH EVERY 8 HOURS AS NEEDED FOR NAUSEA OR VOMITING 20 tablet 0  . pravastatin (PRAVACHOL) 40 MG tablet Take 1 tablet (40 mg total) by mouth daily. 90 tablet 3  . megestrol (MEGACE) 40 MG tablet 1 tab po tid for first two days of period  and then 1 tab po daily until period stops 60 tablet 1  . omeprazole (PRILOSEC) 20 MG capsule TAKE 2 CAPSULES BY MOUTH DAILY. (Patient not taking: Reported on 01/05/2017) 180 capsule 3   No current facility-administered medications for this visit.     Allergies Patient has no known allergies.   Physical Exam:  BP 120/62   Pulse 94   Wt 83.5 kg (184 lb)   LMP 12/11/2016 (Approximate)   BMI 25.66 kg/m  Body mass index is 25.66 kg/m. General appearance: Well nourished, well developed female in no acute distress.  Neck:  Supple, normal appearance, and no thyromegaly  Cardiovascular: normal s1 and s2.  No murmurs, rubs or gallops. Respiratory:  Clear to auscultation bilateral. Normal respiratory effort Abdomen: positive bowel sounds and no masses, hernias; diffusely non tender to palpation, non distended Neuro/Psych:  Normal mood and affect.  Skin:  Warm and dry.  Lymphatic:  No inguinal lymphadenopathy.   Pelvic exam: is not limited by body habitus EGBUS: within normal limits, Vagina: within normal limits and with scant old blood mixed with mucus in the vault, Cervix: normal appearing cervix without tenderness, discharge or lesions. Uterus:  nonenlarged and non tender and Adnexa:  normal adnexa and no mass, fullness, tenderness Rectovaginal: deferred  Laboratory: UPT negative CBC Latest Ref Rng & Units 12/13/2016 11/10/2016 09/22/2016  WBC 4.0 - 10.5 K/uL 7.6 5.8 7.5  Hemoglobin 12.0 - 15.0 g/dL 10.5(L) 12.0 12.3  Hematocrit 36.0 - 46.0 % 32.0(L) 37.5 39.5  Platelets 150 - 400 K/uL 308 354 355    Radiology:  CLINICAL DATA:  Menorrhagia with irregular cycles  EXAM: TRANSABDOMINAL AND TRANSVAGINAL ULTRASOUND OF PELVIS  TECHNIQUE: Both transabdominal and transvaginal ultrasound examinations of the pelvis were performed. Transabdominal technique was performed for global imaging of the pelvis including uterus, ovaries, adnexal regions, and pelvic cul-de-sac. It was necessary to  proceed with endovaginal exam following the transabdominal exam to visualize the uterus and endometrium.  COMPARISON:  None  FINDINGS: Uterus  Measurements: 9.3 x 4.3 x 5.2 cm. Normal morphology without mass  Endometrium  Thickness: 9 mm thick, normal. No endometrial fluid or focal abnormality. Incidentally noted nabothian cysts at cervix.  Right ovary  Measurements: 2.2 x 1.3 x 1.7 cm. Normal morphology without mass  Left ovary  Measurements: 2.4 x 2.1 x 2.9 cm. Dominant follicle without additional mass  Other findings  No free pelvic fluid or adnexal masses.  IMPRESSION: No significant sonographic abnormalities of the pelvis identified.   Electronically Signed   By: Lavonia Dana M.D.   On: 10/15/2016 16:18  Assessment: pt stable.   Plan:  1. Abnormal uterine bleeding (AUB) U/s images reviewed. F/u embx. D/w her several options. Recommend doing the megace with each period (refill sent in). If she fails that, can consider depo provera, LNG IUD or surgical option namely endometrial ablation.  - Surgical pathology  Orders Placed This Encounter  Procedures  . Pregnancy, urine POC    RTC PRN  Durene Romans MD Attending Center for [facility])   embx 9cm. Refill med

## 2017-01-05 NOTE — Telephone Encounter (Signed)
Pt called requesting medication refill on clonazePAM (KLONOPIN) 0.5 MG tablet  Please f/up

## 2017-01-06 ENCOUNTER — Other Ambulatory Visit: Payer: Self-pay | Admitting: Internal Medicine

## 2017-01-06 DIAGNOSIS — F411 Generalized anxiety disorder: Secondary | ICD-10-CM

## 2017-01-06 MED ORDER — CLONAZEPAM 0.5 MG PO TABS: 0.5000 mg | ORAL_TABLET | Freq: Two times a day (BID) | ORAL | 0 refills | Status: DC | PRN

## 2017-01-06 MED ORDER — ONDANSETRON 8 MG PO TBDP: ORAL_TABLET | ORAL | 0 refills | Status: DC

## 2017-01-06 MED FILL — MEGESTROL 40 MG TABLET: 40 | 30 days supply | Qty: 15 | Fill #0

## 2017-01-06 NOTE — Telephone Encounter (Signed)
Prescribed

## 2017-01-06 NOTE — Telephone Encounter (Signed)
Klonopin refill request along with Zofran

## 2017-01-07 NOTE — Telephone Encounter (Signed)
Prescription placed at the front desk for pickup

## 2017-01-17 MED FILL — clonazePAM 0.5 MG TABS: 0.5 | 30 days supply | Qty: 60 | Fill #0

## 2017-01-26 ENCOUNTER — Other Ambulatory Visit: Payer: Self-pay | Admitting: Internal Medicine

## 2017-01-26 DIAGNOSIS — Z1231 Encounter for screening mammogram for malignant neoplasm of breast: Secondary | ICD-10-CM

## 2017-01-31 ENCOUNTER — Other Ambulatory Visit: Payer: Self-pay | Admitting: Internal Medicine

## 2017-01-31 DIAGNOSIS — K219 Gastro-esophageal reflux disease without esophagitis: Secondary | ICD-10-CM

## 2017-02-01 ENCOUNTER — Ambulatory Visit (INDEPENDENT_AMBULATORY_CARE_PROVIDER_SITE_OTHER): Payer: Self-pay

## 2017-02-01 ENCOUNTER — Encounter (HOSPITAL_COMMUNITY): Payer: Self-pay | Admitting: *Deleted

## 2017-02-01 ENCOUNTER — Ambulatory Visit (HOSPITAL_COMMUNITY)
Admission: EM | Admit: 2017-02-01 | Discharge: 2017-02-01 | Disposition: A | Discharge status: Home / Self Care | Payer: Self-pay | Attending: Family Medicine | Admitting: Family Medicine

## 2017-02-01 DIAGNOSIS — M25552 Pain in left hip: Secondary | ICD-10-CM

## 2017-02-01 DIAGNOSIS — W108XXA Fall (on) (from) other stairs and steps, initial encounter: Secondary | ICD-10-CM

## 2017-02-01 DIAGNOSIS — M25522 Pain in left elbow: Secondary | ICD-10-CM

## 2017-02-01 MED ORDER — NAPROXEN 500 MG PO TABS: 500.0000 mg | ORAL_TABLET | Freq: Two times a day (BID) | ORAL | 0 refills | Status: DC

## 2017-02-01 MED ORDER — ACETAMINOPHEN-CODEINE #3 300-30 MG PO TABS: 1.0000 | ORAL_TABLET | Freq: Four times a day (QID) | ORAL | 0 refills | Status: DC | PRN

## 2017-02-01 NOTE — ED Provider Notes (Signed)
[place]    CSN: [id number] Arrival date & time: 02/01/17  1014     History   Chief Complaint Chief Complaint  Patient presents with  . Fall  . Back Pain    HPI Isabel Evans is a 46 y.o. female.   HPI  Pt presents with a 1 day hx of L buttock and L elbow pain after a fall down the steps yesterday. She has bruises over both areas, but the buttock is much more painful. She has tried Advil and Tylenol at home with mild relief. She has a large bruise, not much swelling. Ambulating gingerly. Denies numbness or tingling. She did not hit her head or lose consciousness.   Past Medical History:  Diagnosis Date  . Bone spur    cervical spine  . Deviated nasal septum    states is unable to lie flat, because her nose will become congested and she can stop breathing  . Fibromyalgia   . GERD (gastroesophageal reflux disease)   . Hallux abductovalgus with bunions 09/2014   right   . Hammertoe 09/2014   right 4th, 5th  . Hypertension    states is under control with med., has been on med. x 8 mos.  . IBS (irritable bowel syndrome)    no current med.  . Osteoarthritis    hands  . Sinus headache     Patient Active Problem List   Diagnosis Date Noted  . Bruises easily 11/10/2016  . Gastroesophageal reflux disease without esophagitis 09/22/2016  . Fatigue associated with anemia 09/22/2016  . Flexion contractures 09/22/2016  . Abnormal uterine bleeding (AUB) 09/22/2016  . Generalized anxiety disorder 01/01/2016  . Dental abscess 01/01/2016  . Healthcare maintenance 05/08/2015  . Adjustment disorder with mixed anxiety and depressed mood 05/08/2015  . Stress at home 10/24/2014  . Irritable bowel syndrome 10/24/2014  . Anxiety state 10/24/2014  . Preop testing 09/19/2014  . Pap smear for cervical cancer screening 09/19/2014  . Midline low back pain without sciatica 08/12/2014  . [place] nodes 08/12/2014  . Contracture of joint, hand 08/12/2014  . Essential  hypertension 02/11/2014  . Allergy 02/11/2014  . Screening for breast cancer 02/11/2014  . Dupuytren's contracture of both hands 11/15/2013  . Hallux valgus of right foot 11/15/2013  . Back pain 05/21/2013  . UTI (urinary tract infection) 05/21/2013  . Multiple skin nodules 11/27/2012  . Fibromyalgia 09/08/2012  . Osteoarthritis 09/08/2012  . Hypertension 07/14/2012  . Neck muscle spasm 07/14/2012  . Plantar wart 07/14/2012  . Insomnia 07/14/2012    Past Surgical History:  Procedure Laterality Date  . BUNIONECTOMY Right 10/04/2014   Procedure:  Altamese Channing, Emmaline Life;  Surgeon: Trula Slade, DPM;  Location: [place];  Service: Podiatry;  Laterality: Right;  . COLONOSCOPY WITH PROPOFOL  02/13/2013  . DUPUYTREN / PALMAR FASCIOTOMY Right 07/18/2013   exc. of multiple nodules  . 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 HAMMER TOE SURGERY Right 10/04/2014   Procedure: HAMMER TOE REPAIR 4TH  AND 5TH  RIGHT FOOT  fourth toe fixation with k wire;  Surgeon: Trula Slade, DPM;  Location: [place];  Service: Podiatry;  Laterality: Right;  . 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 TUBAL LIGATION      OB History    Gravida Para Term Preterm AB Living   3 3 3  0 0 3  SAB TAB Ectopic Multiple Live Births   0 0 0 0        Obstetric Comments   SVD x 3       Home Medications    Prior to Admission medications   Medication Sig Start Date End Date Taking? Authorizing Provider  acetaminophen-codeine (TYLENOL #3) 300-30 MG tablet Take 1 tablet by mouth every 6 (six) hours as needed for moderate pain. 02/01/17   Shelda Pal, DO  amLODipine (NORVASC) 10 MG tablet Take 1 tablet (10 mg total) by mouth daily. 09/22/16   Tresa Garter, MD  clonazePAM (KLONOPIN) 0.5 MG tablet Take 1 tablet (0.5 mg total) by mouth 2 (two) times daily as needed. for anxiety 01/06/17   Tresa Garter, MD  cyclobenzaprine (FLEXERIL) 10 MG tablet Take 1 tablet (10 mg total) by mouth 3 (three) times daily as needed. for muscle spams 11/10/16   Tresa Garter, MD  dicyclomine (BENTYL) 10 MG capsule Take 1 capsule (10 mg total) by mouth 4 (four) times daily -  before meals and at bedtime. 11/10/16   Tresa Garter, MD  diphenhydrAMINE (BENADRYL) 25 MG tablet Take 1 tablet (25 mg total) by mouth every 8 (eight) hours as needed. 03/04/16   Tresa Garter, MD  DULoxetine (CYMBALTA) 30 MG capsule TAKE 1 CAPSULE BY MOUTH 2 TIMES DAILY 11/10/16   Tresa Garter, MD  fluticasone (FLONASE) 50 MCG/ACT nasal spray Place 2 sprays into both nostrils daily. 09/22/16   Tresa Garter, MD  gabapentin (NEURONTIN) 300 MG capsule Take 1 capsule (300 mg total) by mouth 3 (three) times daily. 09/22/16   Tresa Garter, MD  megestrol (MEGACE) 40 MG tablet 1 tab po tid for first two days of period and then 1 tab po daily until period stops 01/05/17   Aletha Halim, MD  naproxen (NAPROSYN) 500 MG tablet Take 1 tablet (500 mg total) by mouth 2 (two) times daily. 02/01/17   Shelda Pal, DO  omeprazole (PRILOSEC) 20 MG capsule Take 2 capsules (40 mg total) by mouth daily. 09/22/16   Tresa Garter, MD  ondansetron (ZOFRAN-ODT) 8 MG disintegrating tablet DISSOLVE 1 TABLET IN THE MOUTH EVERY 8 HOURS AS NEEDED FOR NAUSEA OR VOMITING 02/01/17   Tresa Garter, MD  pravastatin (PRAVACHOL) 40 MG tablet Take 1 tablet (40 mg total) by mouth daily. 09/27/16   Tresa Garter, MD    Family History Family History  Problem Relation Age of Onset  . Hypertension Mother   . Diabetes Mother   . Heart disease Mother   . Hypertension Father   . Diabetes Father   . Prostate cancer Father   . Heart disease Father   . Alcohol abuse Father   . Breast cancer Sister   . Hypertension Sister   . Diabetes Sister   . Heart disease Maternal Grandmother        Great GM  .  Breast cancer Maternal Grandmother   . Bone cancer Maternal Grandmother   . Breast cancer Maternal Aunt   . Ovarian cancer Maternal Aunt   . Colon cancer Maternal Aunt        great aunt  . Rheum arthritis Sister   . Colon cancer Son     Social History Social History  Substance Use Topics  . Smoking status: Former Smoker    Quit date: 08/24/2012  . Smokeless tobacco: Never Used  . Alcohol use Yes     Comment:  occasionally     Allergies   Patient has no known allergies.   Review of Systems Review of Systems  Musculoskeletal:       As noted in HPI  Neurological: Negative for weakness and numbness.     Physical Exam Triage Vital Signs ED Triage Vitals  Enc Vitals Group     BP 02/01/17 1109 127/84     Pulse Rate 02/01/17 1109 90     Resp 02/01/17 1109 19     Temp 02/01/17 1109 (!) 97.5 F (36.4 C)     Temp Source 02/01/17 1109 Oral     SpO2 02/01/17 1109 100 %     Pain Score 02/01/17 1111 6   Updated Vital Signs BP 127/84 (BP Location: Right Arm)   Pulse 90   Temp (!) 97.5 F (36.4 C) (Oral) Comment: patient drinking soda  Resp 19   SpO2 100%   Physical Exam  Constitutional: She appears well-developed and well-nourished.  Pulmonary/Chest: No respiratory distress.  Musculoskeletal:  Ecchymosis noted over L elbow medially, TTP over medial epicondyle, normal ROM, no deformity  Large area of ecchymosis and TTP over L buttock laterally, difficult to perform deep palpation of underlying bony structures 2/2 pain.  Skin: Skin is warm and dry.  Psychiatric: She has a normal mood and affect. Judgment normal.     UC Treatments / Results  Radiology Dg Elbow Complete Left  Result Date: 02/01/2017 CLINICAL DATA:  Left elbow pain since a fall down stairs yesterday. Initial encounter. EXAM: LEFT ELBOW - COMPLETE 3+ VIEW COMPARISON:  None. FINDINGS: There is no evidence of fracture, dislocation, or joint effusion. There is no evidence of arthropathy or other focal bone  abnormality. Soft tissues are unremarkable. IMPRESSION: Negative exam. Electronically Signed   By: Inge Rise M.D.   On: 02/01/2017 12:03   Dg Hip Unilat With Pelvis 2-3 Views Left  Result Date: 02/01/2017 CLINICAL DATA:  Fall.  Left-sided injury . EXAM: DG HIP (WITH OR WITHOUT PELVIS) 2-3V LEFT COMPARISON:  No recent prior. FINDINGS: Degenerative changes lumbar spine and both hips. No acute bony abnormality identified. No evidence of fracture dislocation. IMPRESSION: Degenerative changes lumbar spine and both hips. Electronically Signed   By: Marcello Moores  Register   On: 02/01/2017 12:03    Procedures Procedures none  Initial Impression / Assessment and Plan / UC Course  I have reviewed the triage vital signs and the nursing notes.  Pertinent labs & imaging results that were available during my care of the patient were reviewed by me and considered in my medical decision making (see chart for details).  Pt presents with a fall. XR's neg. Treat for soft tissue injury. Ice, heat, NSAIDs, T3's for breakthrough pain. F/u with PCP if symptoms fail to improve. Pt voiced understanding and agreement to the plan.  Final Clinical Impressions(s) / UC Diagnoses   Final diagnoses:  Fall (on) (from) other stairs and steps, initial encounter    New Prescriptions New Prescriptions   ACETAMINOPHEN-CODEINE (TYLENOL #3) 300-30 MG TABLET    Take 1 tablet by mouth every 6 (six) hours as needed for moderate pain.   NAPROXEN (NAPROSYN) 500 MG TABLET    Take 1 tablet (500 mg total) by mouth 2 (two) times daily.     Controlled Substance Prescriptions [place] Controlled Substance Registry consulted? No   Shelda Pal, [place] 02/01/17 1218

## 2017-02-01 NOTE — Discharge Instructions (Signed)
Ice/cold pack over area for 10-15 min every 2-3 hours while awake.  No Advil or Tylenol while taking these medications.  Heat (pad or rice pillow in microwave) over affected area, 10-15 minutes every 2-3 hours while awake.   Activity as tolerated.

## 2017-02-01 NOTE — ED Triage Notes (Signed)
Patient states that fell down the outside stairs yesterday hitting the left side of her body. Patient reports large bruise to left buttocks. State pain starts at neck and goes down left leg.  Patient also reports she has a toothache. Right lower back molar.

## 2017-02-09 ENCOUNTER — Ambulatory Visit (HOSPITAL_COMMUNITY)
Admission: RE | Admit: 2017-02-09 | Discharge: 2017-02-09 | Disposition: A | Discharge status: Home / Self Care | Payer: No Typology Code available for payment source | Source: Ambulatory Visit | Attending: Internal Medicine | Admitting: Internal Medicine

## 2017-02-09 ENCOUNTER — Ambulatory Visit
Admission: RE | Admit: 2017-02-09 | Discharge: 2017-02-09 | Disposition: A | Discharge status: Home / Self Care | Payer: No Typology Code available for payment source | Source: Ambulatory Visit | Attending: Internal Medicine | Admitting: Internal Medicine

## 2017-02-09 ENCOUNTER — Telehealth: Payer: Self-pay | Admitting: *Deleted

## 2017-02-09 ENCOUNTER — Encounter: Payer: Self-pay | Admitting: Internal Medicine

## 2017-02-09 ENCOUNTER — Other Ambulatory Visit: Payer: Self-pay | Admitting: Internal Medicine

## 2017-02-09 ENCOUNTER — Ambulatory Visit: Payer: No Typology Code available for payment source | Attending: Internal Medicine | Admitting: Internal Medicine

## 2017-02-09 VITALS — BP 111/79 | HR 105 | Temp 98.6°F | Resp 18 | Ht 69.5 in | Wt 193.0 lb

## 2017-02-09 DIAGNOSIS — J342 Deviated nasal septum: Secondary | ICD-10-CM | POA: Insufficient documentation

## 2017-02-09 DIAGNOSIS — M25512 Pain in left shoulder: Secondary | ICD-10-CM | POA: Insufficient documentation

## 2017-02-09 DIAGNOSIS — N921 Excessive and frequent menstruation with irregular cycle: Secondary | ICD-10-CM

## 2017-02-09 DIAGNOSIS — F419 Anxiety disorder, unspecified: Secondary | ICD-10-CM | POA: Insufficient documentation

## 2017-02-09 DIAGNOSIS — K589 Irritable bowel syndrome without diarrhea: Secondary | ICD-10-CM | POA: Insufficient documentation

## 2017-02-09 DIAGNOSIS — F329 Major depressive disorder, single episode, unspecified: Secondary | ICD-10-CM | POA: Insufficient documentation

## 2017-02-09 DIAGNOSIS — Z79891 Long term (current) use of opiate analgesic: Secondary | ICD-10-CM | POA: Insufficient documentation

## 2017-02-09 DIAGNOSIS — Z1231 Encounter for screening mammogram for malignant neoplasm of breast: Secondary | ICD-10-CM

## 2017-02-09 DIAGNOSIS — M797 Fibromyalgia: Secondary | ICD-10-CM | POA: Insufficient documentation

## 2017-02-09 DIAGNOSIS — Z79899 Other long term (current) drug therapy: Secondary | ICD-10-CM | POA: Insufficient documentation

## 2017-02-09 DIAGNOSIS — I10 Essential (primary) hypertension: Secondary | ICD-10-CM

## 2017-02-09 DIAGNOSIS — W19XXXD Unspecified fall, subsequent encounter: Secondary | ICD-10-CM | POA: Insufficient documentation

## 2017-02-09 DIAGNOSIS — K219 Gastro-esophageal reflux disease without esophagitis: Secondary | ICD-10-CM

## 2017-02-09 DIAGNOSIS — N920 Excessive and frequent menstruation with regular cycle: Secondary | ICD-10-CM | POA: Insufficient documentation

## 2017-02-09 DIAGNOSIS — K029 Dental caries, unspecified: Secondary | ICD-10-CM | POA: Insufficient documentation

## 2017-02-09 DIAGNOSIS — F4323 Adjustment disorder with mixed anxiety and depressed mood: Secondary | ICD-10-CM | POA: Insufficient documentation

## 2017-02-09 MED ORDER — DICLOFENAC SODIUM 1 % TD GEL: 4.0000 g | Freq: Four times a day (QID) | TRANSDERMAL | 2 refills | Status: DC

## 2017-02-09 MED ORDER — CYCLOBENZAPRINE HCL 10 MG PO TABS: 10.0000 mg | ORAL_TABLET | Freq: Three times a day (TID) | ORAL | 1 refills | Status: DC | PRN

## 2017-02-09 MED FILL — ?DULOXETINE HCL DR 30 MG CA: 30 MG | 30 days supply | Qty: 60 | Fill #2

## 2017-02-09 MED FILL — ?CYCLOBENZAPRINE 10 MG TABL: 10 | 20 days supply | Qty: 60 | Fill #0

## 2017-02-09 MED FILL — MEGESTROL 40 MG TABLET: 40 | 30 days supply | Qty: 15 | Fill #1

## 2017-02-09 MED FILL — ?PRAVASTATIN SODIUM 40MG TA: 40 | 30 days supply | Qty: 30 | Fill #4

## 2017-02-09 MED FILL — FLUTICASONE PROP 50 MCG SPR: 50 | 30 days supply | Qty: 16 | Fill #1

## 2017-02-09 MED FILL — DICYCLOMINE 10 MG CAPSULE: 10 | 30 days supply | Qty: 120 | Fill #1

## 2017-02-09 MED FILL — ?OMEPRAZOLE DR 20 MG CAPSUL: 20 | 30 days supply | Qty: 60 | Fill #0

## 2017-02-09 MED FILL — ONDANSETRON ODT 8 MG TABLET: 8 | 6 days supply | Qty: 20 | Fill #0

## 2017-02-09 MED FILL — NAPROXEN 500 MG TABLET: 500 | 15 days supply | Qty: 30 | Fill #0

## 2017-02-09 MED FILL — AMLODIPINE BESYLATE 10 MG T: 10 | 30 days supply | Qty: 30 | Fill #10

## 2017-02-09 MED FILL — GABAPENTIN 300 MG CAPSULE: 300 | 30 days supply | Qty: 90 | Fill #5

## 2017-02-09 NOTE — Telephone Encounter (Signed)
-----   Message from Tresa Garter, MD sent at 02/09/2017  8:36 AM EDT ----- Please inform patient that her screening mammogram shows no evidence of malignancy. Recommend screening mammogram in one year

## 2017-02-09 NOTE — Patient Instructions (Signed)
Fall Prevention in the Home Falls can cause injuries and can affect people from all age groups. There are many simple things that you can do to make your home safe and to help prevent falls. What can I do on the outside of my home?  Regularly repair the edges of walkways and driveways and fix any cracks.  Remove high doorway thresholds.  Trim any shrubbery on the main path into your home.  Use bright outdoor lighting.  Clear walkways of debris and clutter, including tools and rocks.  Regularly check that handrails are securely fastened and in good repair. Both sides of any steps should have handrails.  Install guardrails along the edges of any raised decks or porches.  Have leaves, snow, and ice cleared regularly.  Use sand or salt on walkways during winter months.  In the garage, clean up any spills right away, including grease or oil spills. What can I do in the bathroom?  Use night lights.  Install grab bars by the toilet and in the tub and shower. Do not use towel bars as grab bars.  Use non-skid mats or decals on the floor of the tub or shower.  If you need to sit down while you are in the shower, use a plastic, non-slip stool.  Keep the floor dry. Immediately clean up any water that spills on the floor.  Remove soap buildup in the tub or shower on a regular basis.  Attach bath mats securely with double-sided non-slip rug tape.  Remove throw rugs and other tripping hazards from the floor. What can I do in the bedroom?  Use night lights.  Make sure that a bedside light is easy to reach.  Do not use oversized bedding that drapes onto the floor.  Have a firm chair that has side arms to use for getting dressed.  Remove throw rugs and other tripping hazards from the floor. What can I do in the kitchen?  Clean up any spills right away.  Avoid walking on wet floors.  Place frequently used items in easy-to-reach places.  If you need to reach for something above  you, use a sturdy step stool that has a grab bar.  Keep electrical cables out of the way.  Do not use floor polish or wax that makes floors slippery. If you have to use wax, make sure that it is non-skid floor wax.  Remove throw rugs and other tripping hazards from the floor. What can I do in the stairways?  Do not leave any items on the stairs.  Make sure that there are handrails on both sides of the stairs. Fix handrails that are broken or loose. Make sure that handrails are as long as the stairways.  Check any carpeting to make sure that it is firmly attached to the stairs. Fix any carpet that is loose or worn.  Avoid having throw rugs at the top or bottom of stairways, or secure the rugs with carpet tape to prevent them from moving.  Make sure that you have a light switch at the top of the stairs and the bottom of the stairs. If you do not have them, have them installed. What are some other fall prevention tips?  Wear closed-toe shoes that fit well and support your feet. Wear shoes that have rubber soles or low heels.  When you use a stepladder, make sure that it is completely opened and that the sides are firmly locked. Have someone hold the ladder while you are using   it. Do not climb a closed stepladder.  Add color or contrast paint or tape to grab bars and handrails in your home. Place contrasting color strips on the first and last steps.  Use mobility aids as needed, such as canes, walkers, scooters, and crutches.  Turn on lights if it is dark. Replace any light bulbs that burn out.  Set up furniture so that there are clear paths. Keep the furniture in the same spot.  Fix any uneven floor surfaces.  Choose a carpet design that does not hide the edge of steps of a stairway.  Be aware of any and all pets.  Review your medicines with your healthcare provider. Some medicines can cause dizziness or changes in blood pressure, which increase your risk of falling. Talk with  your health care provider about other ways that you can decrease your risk of falls. This may include working with a physical therapist or trainer to improve your strength, balance, and endurance. This information is not intended to replace advice given to you by your health care provider. Make sure you discuss any questions you have with your health care provider. Document Released: 03/26/2002 Document Revised: 09/02/2015 Document Reviewed: 05/10/2014 Elsevier Interactive Patient Education  2017 [employer] Menorrhagia Menorrhagia is a condition in which menstrual periods are heavy or last longer than normal. With menorrhagia, most periods a woman has may cause enough blood loss and cramping that she becomes unable to take part in her usual activities. What are the causes? Common causes of this condition include:  Noncancerous growths in the uterus (polyps or fibroids).  An imbalance of the estrogen and progesterone hormones.  One of the ovaries not releasing an egg during one or more months.  A problem with the thyroid gland (hypothyroid).  Side effects of having an intrauterine device (IUD).  Side effects of some medicines, such as anti-inflammatory medicines or blood thinners.  A bleeding disorder that stops the blood from clotting normally.  In some cases, the cause of this condition is not known. What are the signs or symptoms? Symptoms of this condition include:  Routinely having to change your pad or tampon every 1-2 hours because it is completely soaked.  Needing to use pads and tampons at the same time because of heavy bleeding.  Needing to wake up to change your pads or tampons during the night.  Passing blood clots larger than 1 inch (2.5 cm) in size.  Having bleeding that lasts for more than 7 days.  Having symptoms of low iron levels (anemia), such as tiredness, fatigue, or shortness of breath.  How is this diagnosed? This condition may be diagnosed based  on:  A physical exam.  Your symptoms and menstrual history.  Tests, such as: ? Blood tests to check if you are pregnant or have hormonal changes, a bleeding or thyroid disorder, anemia, or other problems. ? Pap test to check for cancerous changes, infections, or inflammation. ? Endometrial biopsy. This test involves removing a tissue sample from the lining of the uterus (endometrium) to be examined under a microscope. ? Pelvic ultrasound. This test uses sound waves to create images of your uterus, ovaries, and vagina. The images can show if you have fibroids or other growths. ? Hysteroscopy. For this test, a small telescope is used to look inside your uterus.  How is this treated? Treatment may not be needed for this condition. If it is needed, the best treatment for you will depend on:  Whether you need to  prevent pregnancy.  Your desire to have children in the future.  The cause and severity of your bleeding.  Your personal preference.  Medicines are the first step in treatment. You may be treated with:  Hormonal birth control methods. These treatments reduce bleeding during your menstrual period. They include: ? Birth control pills. ? Skin patch. ? Vaginal ring. ? Shots (injections) that you get every 3 months. ? Hormonal IUD (intrauterine device). ? Implants that go under the skin.  Medicines that thicken blood and slow bleeding.  Medicines that reduce swelling, such as ibuprofen.  Medicines that contain an artificial (synthetic) hormone called progestin.  Medicines that make the ovaries stop working for a short time.  Iron supplements to treat anemia.  If medicines do not work, surgery may be done. Surgical options may include:  Dilation and curettage (D&C). In this procedure, your health care provider opens (dilates) your cervix and then scrapes or suctions tissue from the endometrium to reduce menstrual bleeding.  Operative hysteroscopy. In this procedure, a  small tube with a light on the end (hysteroscope) is used to view your uterus and help remove polyps that may be causing heavy periods.  Endometrial ablation. This is when various techniques are used to permanently destroy your entire endometrium. After endometrial ablation, most women have little or no menstrual flow. This procedure reduces your ability to become pregnant.  Endometrial resection. In this procedure, an electrosurgical wire loop is used to remove the endometrium. This procedure reduces your ability to become pregnant.  Hysterectomy. This is surgical removal of the uterus. This is a permanent procedure that stops menstrual periods. Pregnancy is not possible after a hysterectomy.  Follow these instructions at home: Medicines  Take over-the-counter and prescription medicines exactly as told by your health care provider. This includes iron pills.  Do not change or switch medicines without asking your health care provider.  Do not take aspirin or medicines that contain aspirin 1 week before or during your menstrual period. Aspirin may make bleeding worse. General instructions  If you need to change your sanitary pad or tampon more than once every 2 hours, limit your activity until the bleeding stops.  Iron pills can cause constipation. To prevent or treat constipation while you are taking prescription iron supplements, your health care provider may recommend that you: ? Drink enough fluid to keep your urine clear or pale yellow. ? Take over-the-counter or prescription medicines. ? Eat foods that are high in fiber, such as fresh fruits and vegetables, whole grains, and beans. ? Limit foods that are high in fat and processed sugars, such as fried and sweet foods.  Eat well-balanced meals, including foods that are high in iron. Foods that have a lot of iron include leafy green vegetables, meat, liver, eggs, and whole grain breads and cereals.  Do not try to lose weight until the  abnormal bleeding has stopped and your blood iron level is back to normal. If you need to lose weight, work with your health care provider to lose weight safely.  Keep all follow-up visits as told by your health care provider. This is important. Contact a health care provider if:  You soak through a pad or tampon every 1 or 2 hours, and this happens every time you have a period.  You need to use pads and tampons at the same time because you are bleeding so much.  You have nausea, vomiting, diarrhea, or other problems related to medicines you are taking. Get help  right away if:  You soak through more than a pad or tampon in 1 hour.  You pass clots bigger than 1 inch (2.5 cm) wide.  You feel short of breath.  You feel like your heart is beating too fast.  You feel dizzy or faint.  You feel very weak or tired. Summary  Menorrhagia is a condition in which menstrual periods are heavy or last longer than normal.  Treatment will depend on the cause of the condition and may include medicines or procedures.  Take over-the-counter and prescription medicines exactly as told by your health care provider. This includes iron pills.  Get help right away if you have heavy bleeding that soaks through more than a pad or tampon in 1 hour, you are passing large clots, or you feel dizzy, faint or short of breath. This information is not intended to replace advice given to you by your health care provider. Make sure you discuss any questions you have with your health care provider. Document Released: 04/05/2005 Document Revised: 03/29/2016 Document Reviewed: 03/29/2016 Elsevier Interactive Patient Education  2017 [employer].

## 2017-02-09 NOTE — Progress Notes (Signed)
Isabel Evans, is a 46 y.o. female  IRW:[id number]  PYP:[id number]  DOB - 1970/12/15  Chief Complaint  Patient presents with  . Back Pain       Subjective:   Isabel Evans is a 46 y.o. female with history of irritable bowel syndrome, fibromyalgia, major depression and anxiety, hypertension and GERD who presents here today for ED visit follow up. Patient presented to the ED on 02/01/2017 after a fall at home on her porch, she landed on her left elbow and left buttock. She sustained large bruises and knot on left buttock and pain all over especially her left shoulder joint and left hip joint. She has appointment with financial counselor this afternoon to qualify her for hand surgery appointment for her contractures. She also request a referral to dentist for her dental care, she said her teeth is chipping away. Patient has No headache, No chest pain, No abdominal pain - No Nausea, No new weakness tingling or numbness, No Cough - SOB. She is still having heavy and prolonged menstrual bleeding and so needs to see the gynecologist again because the prescribed Megace is not working.   No problems updated.  ALLERGIES: No Known Allergies  PAST MEDICAL HISTORY: Past Medical History:  Diagnosis Date  . Bone spur    cervical spine  . Deviated nasal septum    states is unable to lie flat, because her nose will become congested and she can stop breathing  . Fibromyalgia   . GERD (gastroesophageal reflux disease)   . Hallux abductovalgus with bunions 09/2014   right   . Hammertoe 09/2014   right 4th, 5th  . Hypertension    states is under control with med., has been on med. x 8 mos.  . IBS (irritable bowel syndrome)    no current med.  . Osteoarthritis    hands  . Sinus headache     MEDICATIONS AT HOME: Prior to Admission medications   Medication Sig Start Date End Date Taking? Authorizing Provider  acetaminophen-codeine (TYLENOL #3) 300-30 MG tablet Take 1 tablet by mouth every 6  (six) hours as needed for moderate pain. 02/01/17  Yes Shelda Pal, DO  amLODipine (NORVASC) 10 MG tablet Take 1 tablet (10 mg total) by mouth daily. 09/22/16  Yes Tresa Garter, MD  clonazePAM (KLONOPIN) 0.5 MG tablet Take 1 tablet (0.5 mg total) by mouth 2 (two) times daily as needed. for anxiety 01/06/17  Yes Shereena Berquist E, MD  cyclobenzaprine (FLEXERIL) 10 MG tablet Take 1 tablet (10 mg total) by mouth 3 (three) times daily as needed. for muscle spams 02/09/17  Yes Onalee Steinbach E, MD  dicyclomine (BENTYL) 10 MG capsule Take 1 capsule (10 mg total) by mouth 4 (four) times daily -  before meals and at bedtime. 11/10/16  Yes Tresa Garter, MD  diphenhydrAMINE (BENADRYL) 25 MG tablet Take 1 tablet (25 mg total) by mouth every 8 (eight) hours as needed. 03/04/16  Yes Melinna Linarez E, MD  DULoxetine (CYMBALTA) 30 MG capsule TAKE 1 CAPSULE BY MOUTH 2 TIMES DAILY 11/10/16  Yes Sherleen Pangborn E, MD  fluticasone (FLONASE) 50 MCG/ACT nasal spray Place 2 sprays into both nostrils daily. 09/22/16  Yes Tresa Garter, MD  gabapentin (NEURONTIN) 300 MG capsule Take 1 capsule (300 mg total) by mouth 3 (three) times daily. 09/22/16  Yes Tresa Garter, MD  megestrol (MEGACE) 40 MG tablet 1 tab po tid for first two days of period and then 1 tab  po daily until period stops 01/05/17  Yes Pickens, Eduard Clos, MD  naproxen (NAPROSYN) 500 MG tablet Take 1 tablet (500 mg total) by mouth 2 (two) times daily. 02/01/17  Yes Shelda Pal, DO  omeprazole (PRILOSEC) 20 MG capsule Take 2 capsules (40 mg total) by mouth daily. 09/22/16  Yes Marolyn Urschel E, MD  ondansetron (ZOFRAN-ODT) 8 MG disintegrating tablet DISSOLVE 1 TABLET IN THE MOUTH EVERY 8 HOURS AS NEEDED FOR NAUSEA OR VOMITING 02/01/17  Yes Tawn Fitzner E, MD  pravastatin (PRAVACHOL) 40 MG tablet Take 1 tablet (40 mg total) by mouth daily. 09/27/16  Yes Tresa Garter, MD    Objective:    Vitals:   02/09/17 0939  BP: 111/79  Pulse: (!) 105  Resp: 18  Temp: 98.6 F (37 C)  TempSrc: Oral  SpO2: 100%  Weight: 193 lb (87.5 kg)  Height: 5' 9.5" (1.765 m)   Exam General appearance : Awake, alert, not in any distress. Speech Clear. Not toxic looking HEENT: Atraumatic and Normocephalic, pupils equally reactive to light and accomodation Neck: Supple, no JVD. No cervical lymphadenopathy.  Chest: Good air entry bilaterally, no added sounds  CVS: S1 S2 regular, no murmurs.  Abdomen: Bowel sounds present, Non tender and not distended with no gaurding, rigidity or rebound. Extremities: B/L Lower Ext shows no edema, both legs are warm to touch Neurology: Awake alert, and oriented X 3, CN II-XII intact, Non focal Skin: No Rash  Data Review Lab Results  Component Value Date   HGBA1C 5.7 11/27/2015    Assessment & Plan   1. Acute pain of left shoulder  - DG Shoulder Left; Future - cyclobenzaprine (FLEXERIL) 10 MG tablet; Take 1 tablet (10 mg total) by mouth 3 (three) times daily as needed. for muscle spams  Dispense: 60 tablet; Refill: 1  2. Fall, subsequent encounter  - DG Shoulder Left; Future  3. Menorrhagia with irregular cycle  - Ambulatory referral to Gynecology  4. Adjustment disorder with mixed anxiety and depressed mood  - cyclobenzaprine (FLEXERIL) 10 MG tablet; Take 1 tablet (10 mg total) by mouth 3 (three) times daily as needed. for muscle spams  Dispense: 60 tablet; Refill: 1  5. Essential hypertension We have discussed target BP range and blood pressure goal. I have advised patient to check BP regularly and to call us back or report to clinic if the numbers are consistently higher than 140/90. We discussed the importance of compliance with medical therapy and DASH diet recommended, consequences of uncontrolled hypertension discussed.  - continue current BP medications  6. Dental caries  - Ambulatory referral to Dentistry  Patient have been  counseled extensively about nutrition and exercise. Other issues discussed during this visit include: low cholesterol diet, weight control and daily exercise, importance of adherence with medications and regular follow-up. We also discussed long term complications of uncontrolled hypertension.   Return in about 3 months (around 05/12/2017) for Follow up HTN, Follow up Pain and comorbidities.  The patient was given clear instructions to go to ER or return to medical center if symptoms don't improve, worsen or new problems develop. The patient verbalized understanding. The patient was told to call to get lab results if they haven't heard anything in the next week.   This note has been created with Surveyor, quantity. Any transcriptional errors are unintentional.    Angelica Chessman, MD, [place], [place], [place], [place] and [facility] [place], [place]  02/09/2017, 10:07 AM

## 2017-02-09 NOTE — Telephone Encounter (Signed)
Patient verified DOB Patient is aware of no malignancy being noted on mammogram screening. Patient states she was referred to another hand specialist from the orthopedics. Patient is reporting to the clinic for her 9:15 appointment and will discuss concerns. Shirlean Mylar states beverly left a message with patient of 11/25/16.  Patient was advised that she will need to complete financial counseling with the office. Shirlean Mylar states she will set her up with an appointment via telephone at 12:00pm today.

## 2017-02-11 ENCOUNTER — Telehealth: Payer: Self-pay | Admitting: *Deleted

## 2017-02-11 NOTE — Telephone Encounter (Signed)
Patient verified DOB Patient is aware of mammogram being negative and chest x ray being negative. No further questions. Patient provided ortho contact information to follow up on telephone financial counsel.

## 2017-02-11 NOTE — Telephone Encounter (Signed)
-----   Message from Tresa Garter, MD sent at 02/10/2017  4:31 PM EDT ----- Chest X Ray is Negative

## 2017-02-16 ENCOUNTER — Ambulatory Visit: Payer: Self-pay | Attending: Internal Medicine | Admitting: Internal Medicine

## 2017-02-16 ENCOUNTER — Encounter: Payer: Self-pay | Admitting: Internal Medicine

## 2017-02-16 VITALS — BP 105/72 | HR 79 | Temp 98.1°F | Resp 18 | Ht 69.0 in | Wt 191.8 lb

## 2017-02-16 DIAGNOSIS — K219 Gastro-esophageal reflux disease without esophagitis: Secondary | ICD-10-CM | POA: Insufficient documentation

## 2017-02-16 DIAGNOSIS — M25552 Pain in left hip: Secondary | ICD-10-CM | POA: Insufficient documentation

## 2017-02-16 DIAGNOSIS — K589 Irritable bowel syndrome without diarrhea: Secondary | ICD-10-CM | POA: Insufficient documentation

## 2017-02-16 DIAGNOSIS — Z79899 Other long term (current) drug therapy: Secondary | ICD-10-CM | POA: Insufficient documentation

## 2017-02-16 DIAGNOSIS — M1990 Unspecified osteoarthritis, unspecified site: Secondary | ICD-10-CM | POA: Insufficient documentation

## 2017-02-16 DIAGNOSIS — I10 Essential (primary) hypertension: Secondary | ICD-10-CM | POA: Insufficient documentation

## 2017-02-16 DIAGNOSIS — F411 Generalized anxiety disorder: Secondary | ICD-10-CM | POA: Insufficient documentation

## 2017-02-16 DIAGNOSIS — Z09 Encounter for follow-up examination after completed treatment for conditions other than malignant neoplasm: Secondary | ICD-10-CM | POA: Insufficient documentation

## 2017-02-16 DIAGNOSIS — M797 Fibromyalgia: Secondary | ICD-10-CM | POA: Insufficient documentation

## 2017-02-16 DIAGNOSIS — W19XXXD Unspecified fall, subsequent encounter: Secondary | ICD-10-CM

## 2017-02-16 MED ORDER — CLONAZEPAM 0.5 MG PO TABS: 0.5000 mg | ORAL_TABLET | Freq: Two times a day (BID) | ORAL | 0 refills | Status: DC | PRN

## 2017-02-16 MED ORDER — ACETAMINOPHEN-CODEINE #3 300-30 MG PO TABS: 1.0000 | ORAL_TABLET | Freq: Four times a day (QID) | ORAL | 0 refills | Status: DC | PRN

## 2017-02-16 MED FILL — clonazePAM 0.5 MG TABS: 0.5 | 30 days supply | Qty: 60 | Fill #0

## 2017-02-16 MED FILL — ACETAMINOPHEN/COD #3 TABLET: 300-30 | 22 days supply | Qty: 90 | Fill #0

## 2017-02-16 NOTE — Progress Notes (Signed)
Isabel Evans, is a 46 y.o. female  ALP:[id number]  DZH:[id number]  DOB - March 28, 1971  Chief Complaint  Patient presents with  . Fall      Subjective:   Isabel Evans is a 46 y.o. female with history of irritable bowel syndrome, fibromyalgia, major depression and anxiety, hypertension and GERD presents here today for a follow up visit and medication refill. She was seen here last week after a fall at home with large bruise and pain on the left buttock. She now has pain extending from her buttock to her leg and feet. Bruises has resolved. No fever. No chest pain. Patient has No headache, No chest pain, No abdominal pain - No Nausea, No new weakness tingling or numbness, No Cough - SOB. She denies any suicidal ideation or thoughts  Problem  Pain in Joint of Left Hip    ALLERGIES: No Known Allergies  PAST MEDICAL HISTORY: Past Medical History:  Diagnosis Date  . Bone spur    cervical spine  . Deviated nasal septum    states is unable to lie flat, because her nose will become congested and she can stop breathing  . Fibromyalgia   . GERD (gastroesophageal reflux disease)   . Hallux abductovalgus with bunions 09/2014   right   . Hammertoe 09/2014   right 4th, 5th  . Hypertension    states is under control with med., has been on med. x 8 mos.  . IBS (irritable bowel syndrome)    no current med.  . Osteoarthritis    hands  . Sinus headache     MEDICATIONS AT HOME: Prior to Admission medications   Medication Sig Start Date End Date Taking? Authorizing Provider  acetaminophen-codeine (TYLENOL #3) 300-30 MG tablet Take 1 tablet by mouth every 6 (six) hours as needed for moderate pain. 02/16/17  Yes Estaban Mainville E, MD  amLODipine (NORVASC) 10 MG tablet Take 1 tablet (10 mg total) by mouth daily. 09/22/16  Yes Tresa Garter, MD  clonazePAM (KLONOPIN) 0.5 MG tablet Take 1 tablet (0.5 mg total) by mouth 2 (two) times daily as needed. for anxiety 02/16/17  Yes Jordana Dugue,  Keaja Reaume E, MD  cyclobenzaprine (FLEXERIL) 10 MG tablet Take 1 tablet (10 mg total) by mouth 3 (three) times daily as needed. for muscle spams 02/09/17  Yes Ezrael Sam E, MD  diclofenac sodium (VOLTAREN) 1 % GEL Apply 4 g topically 4 (four) times daily. 02/09/17  Yes Tresa Garter, MD  dicyclomine (BENTYL) 10 MG capsule Take 1 capsule (10 mg total) by mouth 4 (four) times daily -  before meals and at bedtime. 11/10/16  Yes Tresa Garter, MD  diphenhydrAMINE (BENADRYL) 25 MG tablet Take 1 tablet (25 mg total) by mouth every 8 (eight) hours as needed. 03/04/16  Yes Janith Nielson E, MD  DULoxetine (CYMBALTA) 30 MG capsule TAKE 1 CAPSULE BY MOUTH 2 TIMES DAILY 11/10/16  Yes Reginold Beale E, MD  fluticasone (FLONASE) 50 MCG/ACT nasal spray Place 2 sprays into both nostrils daily. 09/22/16  Yes Tresa Garter, MD  gabapentin (NEURONTIN) 300 MG capsule Take 1 capsule (300 mg total) by mouth 3 (three) times daily. 09/22/16  Yes Tresa Garter, MD  megestrol (MEGACE) 40 MG tablet 1 tab po tid for first two days of period and then 1 tab po daily until period stops 01/05/17  Yes Aletha Halim, MD  naproxen (NAPROSYN) 500 MG tablet Take 1 tablet (500 mg total) by mouth 2 (two) times daily. 02/01/17  Yes Shelda Pal, DO  omeprazole (PRILOSEC) 20 MG capsule Take 2 capsules (40 mg total) by mouth daily. 09/22/16  Yes Keon Pender E, MD  ondansetron (ZOFRAN-ODT) 8 MG disintegrating tablet DISSOLVE 1 TABLET IN THE MOUTH EVERY 8 HOURS AS NEEDED FOR NAUSEA OR VOMITING 02/01/17  Yes Esterlene Atiyeh E, MD  pravastatin (PRAVACHOL) 40 MG tablet Take 1 tablet (40 mg total) by mouth daily. 09/27/16  Yes Tresa Garter, MD    Objective:   Vitals:   02/16/17 1036  BP: 105/72  Pulse: 79  Resp: 18  Temp: 98.1 F (36.7 C)  TempSrc: Oral  SpO2: 99%  Weight: 191 lb 12.8 oz (87 kg)  Height: 5\' 9"  (1.753 m)   Exam General appearance : Awake, alert, not in  any distress. Speech Clear. Not toxic looking HEENT: Atraumatic and Normocephalic, pupils equally reactive to light and accomodation Neck: Supple, no JVD. No cervical lymphadenopathy.  Chest: Good air entry bilaterally, no added sounds  CVS: S1 S2 regular, no murmurs.  Abdomen: Bowel sounds present, Non tender and not distended with no gaurding, rigidity or rebound. Extremities: B/L Lower Ext shows no edema, both legs are warm to touch Neurology: Awake alert, and oriented X 3, CN II-XII intact, Non focal Skin: No Rash  Data Review Lab Results  Component Value Date   HGBA1C 5.7 11/27/2015   Assessment & Plan   1. Generalized anxiety disorder  - clonazePAM (KLONOPIN) 0.5 MG tablet; Take 1 tablet (0.5 mg total) by mouth 2 (two) times daily as needed. for anxiety  Dispense: 60 tablet; Refill: 0  2. Essential hypertension  We have discussed target BP range and blood pressure goal. I have advised patient to check BP regularly and to call us back or report to clinic if the numbers are consistently higher than 140/90. We discussed the importance of compliance with medical therapy and DASH diet recommended, consequences of uncontrolled hypertension discussed.  - continue current BP medications\  3. Pain in joint of left hip  - Prescribed Tylenol #3 x (#90)   Patient have been counseled extensively about nutrition and exercise. Other issues discussed during this visit include: low cholesterol diet, weight control and daily exercise, importance of adherence with medications and regular follow-up. We also discussed long term complications of uncontrolled hypertension.   Return if symptoms worsen or fail to improve, for Routine Follow Up.  The patient was given clear instructions to go to ER or return to medical center if symptoms don't improve, worsen or new problems develop. The patient verbalized understanding. The patient was told to call to get lab results if they haven't heard anything in  the next week.   This note has been created with Surveyor, quantity. Any transcriptional errors are unintentional.    Angelica Chessman, MD, [place], [place], [place], [place] and [facility] [place], [place]   02/16/2017, 11:31 AM

## 2017-02-16 NOTE — Progress Notes (Signed)
Patient stated that she fell 2-3 weeks ago. She said the pain start from her butt to her leg. Any movements she makes give her a sharp pain.

## 2017-02-16 NOTE — Patient Instructions (Signed)
Arthritis Arthritis means joint pain. It can also mean joint disease. A joint is a place where bones come together. People who have arthritis may have:  Red joints.  Swollen joints.  Stiff joints.  Warm joints.  A fever.  A feeling of being sick.  Follow these instructions at home: Pay attention to any changes in your symptoms. Take these actions to help with your pain and swelling. Medicines  Take over-the-counter and prescription medicines only as told by your doctor.  Do not take aspirin for pain if your doctor says that you may have gout. Activity  Rest your joint if your doctor tells you to.  Avoid activities that make the pain worse.  Exercise your joint regularly as told by your doctor. Try doing exercises like: ? Swimming. ? Water aerobics. ? Biking. ? Walking. Joint Care   If your joint is swollen, keep it raised (elevated) if told by your doctor.  If your joint feels stiff in the morning, try taking a warm shower.  If you have diabetes, do not apply heat without asking your doctor.  If told, apply heat to the joint: ? Put a towel between the joint and the hot pack or heating pad. ? Leave the heat on the area for 20-30 minutes.  If told, apply ice to the joint: ? Put ice in a plastic bag. ? Place a towel between your skin and the bag. ? Leave the ice on for 20 minutes, 2-3 times per day.  Keep all follow-up visits as told by your doctor. Contact a doctor if:  The pain gets worse.  You have a fever. Get help right away if:  You have very bad pain in your joint.  You have swelling in your joint.  Your joint is red.  Many joints become painful and swollen.  You have very bad back pain.  Your leg is very weak.  You cannot control your pee (urine) or poop (stool). This information is not intended to replace advice given to you by your health care provider. Make sure you discuss any questions you have with your health care provider. Document  Released: 06/30/2009 Document Revised: 09/11/2015 Document Reviewed: 07/01/2014 Elsevier Interactive Patient Education  2018 Elsevier Inc.  

## 2017-03-07 ENCOUNTER — Ambulatory Visit: Payer: Self-pay | Admitting: Obstetrics & Gynecology

## 2017-03-22 ENCOUNTER — Other Ambulatory Visit: Payer: Self-pay | Admitting: Internal Medicine

## 2017-03-22 DIAGNOSIS — F411 Generalized anxiety disorder: Secondary | ICD-10-CM

## 2017-03-22 MED FILL — ?OMEPRAZOLE DR 20MG CAPSULE: 20 | 30 days supply | Qty: 60 | Fill #1

## 2017-03-22 MED FILL — GABAPENTIN 300 MG CAPSULE: 300 | 30 days supply | Qty: 90 | Fill #6

## 2017-03-22 MED FILL — PRAVASTATIN NA 40 MG TAB: 40 | 30 days supply | Qty: 30 | Fill #5

## 2017-03-22 MED FILL — ?DULOXETINE HCL DR 30 MG CA: 30 MG | 30 days supply | Qty: 60 | Fill #3

## 2017-03-22 MED FILL — ?CYCLOBENZAPRINE 10 MG TABL: 10 | 20 days supply | Qty: 60 | Fill #1

## 2017-03-22 MED FILL — MEGESTROL 40 MG TABLET: 40 | 30 days supply | Qty: 15 | Fill #2

## 2017-03-22 MED FILL — AMLODIPINE BESYLATE 10 MG T: 10 | 30 days supply | Qty: 30 | Fill #0

## 2017-03-22 MED FILL — ONDANSETRON ODT 8 MG TABLET: 8 | 6 days supply | Qty: 20 | Fill #0

## 2017-03-24 MED FILL — FLUTICASONE PROP 50 MCG SPR: 50 | 30 days supply | Qty: 16 | Fill #2

## 2017-03-28 ENCOUNTER — Telehealth: Payer: Self-pay | Admitting: *Deleted

## 2017-03-28 NOTE — Telephone Encounter (Signed)
Spoke with patient to make her aware the office will be closed tomorrow until 10 am and we will be unable to see her tomorrow.  Offered appt for 03/31/17 at 1 pm.  Patient is agreeable to appt on 03/31/17.

## 2017-03-29 ENCOUNTER — Ambulatory Visit: Payer: Self-pay | Admitting: Obstetrics & Gynecology

## 2017-03-31 ENCOUNTER — Ambulatory Visit: Payer: Self-pay | Admitting: Obstetrics & Gynecology

## 2017-04-07 ENCOUNTER — Other Ambulatory Visit: Payer: Self-pay | Admitting: Internal Medicine

## 2017-04-07 DIAGNOSIS — F411 Generalized anxiety disorder: Secondary | ICD-10-CM

## 2017-04-07 MED ORDER — ACETAMINOPHEN-CODEINE #3 300-30 MG PO TABS: 1.0000 | ORAL_TABLET | Freq: Four times a day (QID) | ORAL | 0 refills | Status: DC | PRN

## 2017-04-07 MED ORDER — CLONAZEPAM 0.5 MG PO TABS: 0.5000 mg | ORAL_TABLET | Freq: Two times a day (BID) | ORAL | 0 refills | Status: DC | PRN

## 2017-04-07 MED ORDER — ONDANSETRON 8 MG PO TBDP: 8.0000 mg | ORAL_TABLET | Freq: Three times a day (TID) | ORAL | 0 refills | Status: DC | PRN

## 2017-04-08 MED FILL — clonazePAM 0.5 MG TABS: 0.5 | 30 days supply | Qty: 60 | Fill #0

## 2017-04-08 MED FILL — ACETAMINOPHEN/COD #3 TABLET: 300-30 | 22 days supply | Qty: 90 | Fill #0

## 2017-04-19 DIAGNOSIS — N189 Chronic kidney disease, unspecified: Secondary | ICD-10-CM

## 2017-04-19 HISTORY — DX: Chronic kidney disease, unspecified: N18.9

## 2017-04-25 ENCOUNTER — Telehealth: Payer: Self-pay | Admitting: Internal Medicine

## 2017-04-25 ENCOUNTER — Ambulatory Visit: Payer: Self-pay

## 2017-04-25 ENCOUNTER — Encounter (HOSPITAL_COMMUNITY): Payer: Self-pay

## 2017-04-25 ENCOUNTER — Other Ambulatory Visit: Payer: Self-pay | Admitting: Internal Medicine

## 2017-04-25 MED ORDER — ONDANSETRON 8 MG PO TBDP: 8.0000 mg | ORAL_TABLET | Freq: Three times a day (TID) | ORAL | 0 refills | Status: DC | PRN

## 2017-04-25 MED FILL — CYCLOBENZAPRINE 10 MG TAB: 10 | 10 days supply | Qty: 30 | Fill #1

## 2017-04-25 MED FILL — ?OMEPRAZOLE DR 20MG CAPSULE: 20 | 30 days supply | Qty: 60 | Fill #2

## 2017-04-25 MED FILL — ONDANSETRON ODT 8 MG TABLET: 8 | 7 days supply | Qty: 20 | Fill #0

## 2017-04-25 MED FILL — AMLODIPINE BESYLATE 10 MG T: 10 | 30 days supply | Qty: 30 | Fill #1

## 2017-04-25 MED FILL — GABAPENTIN 300 MG CAPSULE: 300 | 30 days supply | Qty: 90 | Fill #7

## 2017-04-25 MED FILL — DULoxetine HCL 30 MG CPEP: 30 | 30 days supply | Qty: 60 | Fill #0

## 2017-04-25 MED FILL — PRAVASTATIN NA 40 MG TAB: 40 | 30 days supply | Qty: 30 | Fill #6

## 2017-04-25 NOTE — Telephone Encounter (Signed)
Patient came in asking for [place] to give her a call about a dental referral and she hasn't heard from [facility] and she also was worried about the foot doctor. Please call back at [phone]

## 2017-04-25 NOTE — Telephone Encounter (Signed)
Pt came into office requesting status of referral to podiatry, pt is scheduled to see provider 05/18/17 but would like to know if referral could be processed before then. Please advise

## 2017-04-27 ENCOUNTER — Encounter: Payer: Self-pay | Admitting: Obstetrics and Gynecology

## 2017-04-27 ENCOUNTER — Ambulatory Visit (INDEPENDENT_AMBULATORY_CARE_PROVIDER_SITE_OTHER): Payer: Self-pay | Admitting: Obstetrics and Gynecology

## 2017-04-27 VITALS — Ht 69.5 in | Wt 197.1 lb

## 2017-04-27 DIAGNOSIS — N939 Abnormal uterine and vaginal bleeding, unspecified: Secondary | ICD-10-CM

## 2017-04-27 NOTE — Progress Notes (Signed)
Patient PHQ9 and GAD7 elevated. Patient declines to see [facility], states she is in treatment and is on medication. No plans for self harm. Dr Ilda Basset and Roselyn Reef aware.

## 2017-04-27 NOTE — Progress Notes (Addendum)
History:  [name] is a 47 y.o. [phone] who presents to clinic today for heavy menstrual bleeding.   She states her menstrual periods have been consistently heavy since August. Her periods occur regularly and last 5-7 days, with spotting every day in between. She takes megestrol during her cycle which reduces the bleeding.  She has been experiencing hot flashes and night sweats frequently for about a year, as well as occasional pain during intercourse. The patient states she has cramping of her stomach and back consistently, and a constant feeling of bloating/pressure in her lower abdomen, which she feels is related to the bleeding. She also has occasional SUI.   The following portions of the patient's history were reviewed and updated as appropriate: allergies, current medications, family history, past medical history, social history, past surgical history and problem list.    Objective:  Physical Exam Ht 5' 9.5" (1.765 m)   Wt 197 lb 1.6 oz (89.4 kg)   BMI 28.69 kg/m  Physical Exam General: Alert, oriented and cooperative. Patient is in no acute distress.  Skin: Skin is warm and dry   Respiratory: Normal respiratory effort, no problems with respiration noted   Pelvic:  Cervical exam deferred        Mental Status: Normal mood and affect. Normal behavior. Normal judgment and thought content.    Labs and Imaging No results found for this or any previous visit (from the past 24 hour(s)).   Assessment & Plan:  Abnormal uterine bleeding (AUB): Patient's endometrial biopsy done at last visit was normal and pap UTD. She has taken megace with each period but continues to have daily spotting. Patient continues to be frustrated with AUB and desires surgical treatment. Options of hysterectomy and ablation were discussed with patient. She was provided with information on each and will make a decision on which option she would like to pursue in the next few days. But I told her that I'd  recommend ablation given her age and being perimenopausal as it likely would work until she is in menopause. Recommend decreasing water intake and voiding q55m-120m to help keep bladder empty.   Patient already sees a therapist and denies any current SI or plan.   Senaida Lange, Medical Student 04/27/2017 9:58 AM  Seen with MS Owens Shark.  Durene Romans MD Attending Center for [employer] (Faculty Practice) 04/27/2017

## 2017-04-28 ENCOUNTER — Encounter: Payer: Self-pay | Admitting: Internal Medicine

## 2017-04-28 ENCOUNTER — Telehealth: Payer: Self-pay | Admitting: Internal Medicine

## 2017-04-28 NOTE — Telephone Encounter (Signed)
Patient wanting a [facility] # to call to make her foot referral appt. Please fu.

## 2017-05-02 ENCOUNTER — Telehealth: Payer: Self-pay | Admitting: Obstetrics and Gynecology

## 2017-05-02 NOTE — Telephone Encounter (Signed)
GYN Telephone Note Called patient and d/w her re: options and she'd like to proceed with hysterectomy. D/w her recommend vag hyst which she is amenable to. Will send to scheduling.   Durene Romans MD Attending Center for [employer] (Faculty Practice) 05/02/2017 Time: 1128am

## 2017-05-02 NOTE — Telephone Encounter (Signed)
Was told by Dr.Pickens to call back when she decided what she want to due about surgery.

## 2017-05-03 ENCOUNTER — Encounter (HOSPITAL_COMMUNITY): Payer: Self-pay

## 2017-05-03 MED FILL — MEGESTROL 40 MG TABLET: 40 | 30 days supply | Qty: 15 | Fill #3

## 2017-05-09 NOTE — Telephone Encounter (Signed)
Pt asking if we have a number for the financial counseling department for [facility]. She states someone called her from there and she has not been able to reach anyone since they called. Pt given phone main number to [facility]: [phone].

## 2017-05-16 MED FILL — ACETAMINOPHEN/COD #3 TABLET: 300-30 | 22 days supply | Qty: 90 | Fill #0

## 2017-05-16 MED FILL — clonazePAM 0.5 MG TABS: 0.5 | 30 days supply | Qty: 60 | Fill #0

## 2017-05-18 ENCOUNTER — Other Ambulatory Visit: Payer: Self-pay

## 2017-05-18 ENCOUNTER — Encounter (INDEPENDENT_AMBULATORY_CARE_PROVIDER_SITE_OTHER): Payer: Self-pay

## 2017-05-18 ENCOUNTER — Ambulatory Visit: Payer: Self-pay | Attending: Internal Medicine | Admitting: Internal Medicine

## 2017-05-18 ENCOUNTER — Encounter: Payer: Self-pay | Admitting: Internal Medicine

## 2017-05-18 VITALS — BP 110/78 | HR 96 | Temp 98.2°F | Resp 16 | Wt 199.6 lb

## 2017-05-18 DIAGNOSIS — M1990 Unspecified osteoarthritis, unspecified site: Secondary | ICD-10-CM | POA: Insufficient documentation

## 2017-05-18 DIAGNOSIS — L84 Corns and callosities: Secondary | ICD-10-CM | POA: Insufficient documentation

## 2017-05-18 DIAGNOSIS — Z79899 Other long term (current) drug therapy: Secondary | ICD-10-CM | POA: Insufficient documentation

## 2017-05-18 DIAGNOSIS — I10 Essential (primary) hypertension: Secondary | ICD-10-CM | POA: Insufficient documentation

## 2017-05-18 DIAGNOSIS — F4323 Adjustment disorder with mixed anxiety and depressed mood: Secondary | ICD-10-CM | POA: Insufficient documentation

## 2017-05-18 DIAGNOSIS — K219 Gastro-esophageal reflux disease without esophagitis: Secondary | ICD-10-CM | POA: Insufficient documentation

## 2017-05-18 DIAGNOSIS — M797 Fibromyalgia: Secondary | ICD-10-CM | POA: Insufficient documentation

## 2017-05-18 DIAGNOSIS — H538 Other visual disturbances: Secondary | ICD-10-CM | POA: Insufficient documentation

## 2017-05-18 DIAGNOSIS — M25552 Pain in left hip: Secondary | ICD-10-CM

## 2017-05-18 MED ORDER — CYCLOBENZAPRINE HCL 10 MG PO TABS: 10.0000 mg | ORAL_TABLET | Freq: Three times a day (TID) | ORAL | 1 refills | Status: DC | PRN

## 2017-05-18 MED ORDER — AMLODIPINE BESYLATE 10 MG PO TABS: 10.0000 mg | ORAL_TABLET | Freq: Every day | ORAL | 3 refills | Status: DC

## 2017-05-18 MED ORDER — CLONAZEPAM 0.5 MG PO TABS: 0.5000 mg | ORAL_TABLET | Freq: Two times a day (BID) | ORAL | 0 refills | Status: DC | PRN

## 2017-05-18 MED ORDER — DULOXETINE HCL 60 MG PO CPEP: 60.0000 mg | ORAL_CAPSULE | Freq: Two times a day (BID) | ORAL | 3 refills | Status: DC

## 2017-05-18 MED ORDER — GABAPENTIN 300 MG PO CAPS: 600.0000 mg | ORAL_CAPSULE | Freq: Three times a day (TID) | ORAL | 3 refills | Status: DC

## 2017-05-18 MED ORDER — PRAVASTATIN SODIUM 40 MG PO TABS: 40.0000 mg | ORAL_TABLET | Freq: Every day | ORAL | 3 refills | Status: DC

## 2017-05-18 MED FILL — CYCLOBENZAPRINE 10 MG TAB: 10 | 20 days supply | Qty: 60 | Fill #0

## 2017-05-18 NOTE — Patient Instructions (Signed)

## 2017-05-18 NOTE — Progress Notes (Signed)
Isabel Evans, is a 47 y.o. female  CNO:[id number]  QHU:[id number]  DOB - 16-Dec-1970  Subjective:   Isabel Evans is a 47 y.o. female with history of irritable bowel syndrome, fibromyalgia, major depression and anxiety, hypertension and GERDpresents here today for a follow up visit and medication refill. Patient has no new complaint today, she is due for diabetic eye and foot exam. She needs refill of her medications. Patient has No headache, No chest pain, No abdominal pain - No Nausea, No new weakness tingling or numbness, No Cough - SOB. She denies any suicidal ideation or thoughts.  Problem  Blurry Vision, Bilateral  Callus of Foot    ALLERGIES: No Known Allergies  PAST MEDICAL HISTORY: Past Medical History:  Diagnosis Date  . Bone spur    cervical spine  . Deviated nasal septum    states is unable to lie flat, because her nose will become congested and she can stop breathing  . Fibromyalgia   . GERD (gastroesophageal reflux disease)   . Hallux abductovalgus with bunions 09/2014   right   . Hammertoe 09/2014   right 4th, 5th  . Hypertension    states is under control with med., has been on med. x 8 mos.  . IBS (irritable bowel syndrome)    no current med.  . Osteoarthritis    hands  . Sinus headache     MEDICATIONS AT HOME: Prior to Admission medications   Medication Sig Start Date End Date Taking? Authorizing Provider  acetaminophen-codeine (TYLENOL #3) 300-30 MG tablet Take 1 tablet by mouth every 6 (six) hours as needed for moderate pain. 04/07/17  Yes Tresa Garter, MD  amLODipine (NORVASC) 10 MG tablet Take 1 tablet (10 mg total) by mouth daily. 05/18/17  Yes Tresa Garter, MD  clonazePAM (KLONOPIN) 0.5 MG tablet Take 1 tablet (0.5 mg total) by mouth 2 (two) times daily as needed. for anxiety 05/18/17  Yes Hussain Maimone, Marlena Clipper, MD  cyclobenzaprine (FLEXERIL) 10 MG tablet Take 1 tablet (10 mg total) by mouth 3 (three) times daily as needed. for  muscle spams 05/18/17  Yes Mindee Robledo E, MD  diclofenac sodium (VOLTAREN) 1 % GEL Apply 4 g topically 4 (four) times daily. 02/09/17  Yes Tresa Garter, MD  DULoxetine (CYMBALTA) 60 MG capsule Take 1 capsule (60 mg total) by mouth 2 (two) times daily. 05/18/17  Yes Tresa Garter, MD  fluticasone (FLONASE) 50 MCG/ACT nasal spray Place 2 sprays into both nostrils daily. 09/22/16  Yes Tresa Garter, MD  gabapentin (NEURONTIN) 300 MG capsule Take 2 capsules (600 mg total) by mouth 3 (three) times daily. 05/18/17  Yes Tresa Garter, MD  megestrol (MEGACE) 40 MG tablet 1 tab po tid for first two days of period and then 1 tab po daily until period stops 01/05/17  Yes Aletha Halim, MD  omeprazole (PRILOSEC) 20 MG capsule Take 2 capsules (40 mg total) by mouth daily. 09/22/16  Yes Tresa Garter, MD  ondansetron (ZOFRAN-ODT) 8 MG disintegrating tablet Take 1 tablet (8 mg total) by mouth every 8 (eight) hours as needed for nausea or vomiting. 04/25/17  Yes Tresa Garter, MD  pravastatin (PRAVACHOL) 40 MG tablet Take 1 tablet (40 mg total) by mouth daily. 05/18/17  Yes Tresa Garter, MD    Objective:   Vitals:   05/18/17 1416  BP: 110/78  Pulse: 96  Resp: 16  Temp: 98.2 F (36.8 C)  TempSrc: Oral  SpO2: 97%  Weight: 199 lb 9.6 oz (90.5 kg)   Exam General appearance : Awake, alert, not in any distress. Speech Clear. Not toxic looking HEENT: Atraumatic and Normocephalic, pupils equally reactive to light and accomodation Neck: Supple, no JVD. No cervical lymphadenopathy.  Chest: Good air entry bilaterally, no added sounds  CVS: S1 S2 regular, no murmurs.  Abdomen: Bowel sounds present, Non tender and not distended with no gaurding, rigidity or rebound. Extremities: B/L Lower Ext shows no edema, both legs are warm to touch Neurology: Awake alert, and oriented X 3, CN II-XII intact, Non focal Skin: No Rash  Data Review Lab Results  Component Value  Date   HGBA1C 5.7 11/27/2015    Assessment & Plan   1. Essential hypertension  - amLODipine (NORVASC) 10 MG tablet; Take 1 tablet (10 mg total) by mouth daily.  Dispense: 90 tablet; Refill: 3  2. Adjustment disorder with mixed anxiety and depressed mood  - clonazePAM (KLONOPIN) 0.5 MG tablet; Take 1 tablet (0.5 mg total) by mouth 2 (two) times daily as needed. for anxiety  Dispense: 60 tablet; Refill: 0 - cyclobenzaprine (FLEXERIL) 10 MG tablet; Take 1 tablet (10 mg total) by mouth 3 (three) times daily as needed. for muscle spams  Dispense: 60 tablet; Refill: 1 Increase - DULoxetine (CYMBALTA) 60 MG capsule; Take 1 capsule (60 mg total) by mouth 2 (two) times daily.  Dispense: 180 capsule; Refill: 3  3. Fibromyalgia Increase to 600  mg 3x daily - gabapentin (NEURONTIN) 300 MG capsule; Take 2 capsules (600 mg total) by mouth 3 (three) times daily.  Dispense: 540 capsule; Refill: 3  4. Blurry vision, bilateral  - Ambulatory referral to Ophthalmology  5. Callus of foot  - Ambulatory referral to Podiatry  Patient have been counseled extensively about nutrition and exercise. Other issues discussed during this visit include: low cholesterol diet, weight control and daily exercise, importance of adherence with medications and regular follow-up.   Return in about 6 months (around 11/15/2017) for Follow up Pain and comorbidities, Routine Follow Up.  The patient was given clear instructions to go to ER or return to medical center if symptoms don't improve, worsen or new problems develop. The patient verbalized understanding. The patient was told to call to get lab results if they haven't heard anything in the next week.   This note has been created with Surveyor, quantity. Any transcriptional errors are unintentional.    Angelica Chessman, MD, MHA, Karilyn Cota, [place] and [place], [place]     05/18/2017, 3:13 PM

## 2017-05-18 NOTE — Progress Notes (Signed)
Fibromyalgia Bil foot concerns Headaches depression

## 2017-05-19 MED FILL — MEGESTROL 40 MG TABLET: 40 | 30 days supply | Qty: 30 | Fill #4

## 2017-05-20 ENCOUNTER — Institutional Professional Consult (permissible substitution): Payer: Self-pay | Admitting: Licensed Clinical Social Worker

## 2017-05-25 ENCOUNTER — Institutional Professional Consult (permissible substitution): Payer: Self-pay | Admitting: Licensed Clinical Social Worker

## 2017-05-30 ENCOUNTER — Other Ambulatory Visit: Payer: Self-pay | Admitting: Internal Medicine

## 2017-05-30 DIAGNOSIS — R140 Abdominal distension (gaseous): Secondary | ICD-10-CM

## 2017-05-30 MED FILL — GABAPENTIN 300 MG CAPSULE: 300 | 30 days supply | Qty: 90 | Fill #8

## 2017-05-30 MED FILL — FLUTICASONE PROP 50 MCG SPR: 50 | 30 days supply | Qty: 16 | Fill #3

## 2017-05-30 MED FILL — PRAVASTATIN NA 40 MG TAB: 40 | 30 days supply | Qty: 30 | Fill #7

## 2017-05-30 MED FILL — ONDANSETRON ODT 8 MG TABLET: 8 | 7 days supply | Qty: 20 | Fill #0

## 2017-05-30 MED FILL — DULoxetine HCL 30 MG CPEP: 30 | 30 days supply | Qty: 60 | Fill #1

## 2017-05-30 MED FILL — AMLODIPINE BESYLATE 10 MG T: 10 | 30 days supply | Qty: 30 | Fill #2

## 2017-05-30 MED FILL — ?OMEPRAZOLE 20 MG CPDR: 20 | 30 days supply | Qty: 60 | Fill #3

## 2017-06-02 ENCOUNTER — Ambulatory Visit: Payer: Self-pay | Admitting: Podiatry

## 2017-06-10 MED FILL — GABAPENTIN 300 MG CAPSULE: 300 | 30 days supply | Qty: 180 | Fill #0

## 2017-06-10 MED FILL — ?DULoxetine HCL 60MG CPEP: 60 | 30 days supply | Qty: 60 | Fill #0

## 2017-06-16 ENCOUNTER — Ambulatory Visit: Payer: Self-pay | Admitting: Podiatry

## 2017-06-22 ENCOUNTER — Other Ambulatory Visit: Payer: Self-pay | Admitting: Obstetrics and Gynecology

## 2017-06-24 ENCOUNTER — Ambulatory Visit: Payer: No Typology Code available for payment source | Admitting: Podiatry

## 2017-06-24 DIAGNOSIS — D2121 Benign neoplasm of connective and other soft tissue of right lower limb, including hip: Secondary | ICD-10-CM

## 2017-06-24 DIAGNOSIS — D2122 Benign neoplasm of connective and other soft tissue of left lower limb, including hip: Secondary | ICD-10-CM

## 2017-06-24 DIAGNOSIS — M722 Plantar fascial fibromatosis: Secondary | ICD-10-CM

## 2017-06-24 NOTE — Patient Instructions (Signed)

## 2017-06-27 ENCOUNTER — Encounter (HOSPITAL_COMMUNITY)
Admission: RE | Admit: 2017-06-27 | Discharge: 2017-06-27 | Disposition: A | Discharge status: Home / Self Care | Payer: Self-pay | Source: Ambulatory Visit | Attending: Obstetrics and Gynecology | Admitting: Obstetrics and Gynecology

## 2017-06-27 ENCOUNTER — Other Ambulatory Visit: Payer: Self-pay

## 2017-06-27 ENCOUNTER — Other Ambulatory Visit: Payer: Self-pay | Admitting: Internal Medicine

## 2017-06-27 ENCOUNTER — Encounter (HOSPITAL_COMMUNITY): Payer: Self-pay

## 2017-06-27 DIAGNOSIS — R140 Abdominal distension (gaseous): Secondary | ICD-10-CM

## 2017-06-27 DIAGNOSIS — F4323 Adjustment disorder with mixed anxiety and depressed mood: Secondary | ICD-10-CM

## 2017-06-27 DIAGNOSIS — F411 Generalized anxiety disorder: Secondary | ICD-10-CM

## 2017-06-27 DIAGNOSIS — Z01818 Encounter for other preprocedural examination: Secondary | ICD-10-CM | POA: Insufficient documentation

## 2017-06-27 DIAGNOSIS — R9431 Abnormal electrocardiogram [ECG] [EKG]: Secondary | ICD-10-CM | POA: Insufficient documentation

## 2017-06-27 HISTORY — DX: Depression, unspecified: F32.A

## 2017-06-27 HISTORY — DX: Personal history of peptic ulcer disease: Z87.11

## 2017-06-27 HISTORY — DX: Major depressive disorder, single episode, unspecified: F32.9

## 2017-06-27 HISTORY — DX: Anxiety disorder, unspecified: F41.9

## 2017-06-27 HISTORY — DX: Peripheral vascular disease, unspecified: I73.9

## 2017-06-27 HISTORY — DX: Cardiac arrhythmia, unspecified: I49.9

## 2017-06-27 HISTORY — DX: Personal history of other diseases of the digestive system: Z87.19

## 2017-06-27 HISTORY — DX: Chronic kidney disease, unspecified: N18.9

## 2017-06-27 LAB — CBC
HCT: 34.3 % — ABNORMAL LOW (ref 36.0–46.0)
Hemoglobin: 10.6 g/dL — ABNORMAL LOW (ref 12.0–15.0)
MCH: 22.6 pg — ABNORMAL LOW (ref 26.0–34.0)
MCHC: 30.9 g/dL (ref 30.0–36.0)
MCV: 73.1 fL — AB (ref 78.0–100.0)
Platelets: 393 10*3/uL (ref 150–400)
RBC: 4.69 MIL/uL (ref 3.87–5.11)
RDW: 16.7 % — AB (ref 11.5–15.5)
WBC: 9.1 10*3/uL (ref 4.0–10.5)

## 2017-06-27 LAB — COMPREHENSIVE METABOLIC PANEL
ALK PHOS: 77 U/L (ref 38–126)
ALT: 19 U/L (ref 14–54)
ANION GAP: 11 (ref 5–15)
AST: 19 U/L (ref 15–41)
Albumin: 4.8 g/dL (ref 3.5–5.0)
BILIRUBIN TOTAL: 0.6 mg/dL (ref 0.3–1.2)
BUN: 12 mg/dL (ref 6–20)
CALCIUM: 9.5 mg/dL (ref 8.9–10.3)
CO2: 24 mmol/L (ref 22–32)
Chloride: 99 mmol/L — ABNORMAL LOW (ref 101–111)
Creatinine, Ser: 0.83 mg/dL (ref 0.44–1.00)
GFR calc Af Amer: >60 mL/min (ref >60)
Glucose, Bld: 102 mg/dL — ABNORMAL HIGH (ref 65–99)
POTASSIUM: 3.9 mmol/L (ref 3.5–5.1)
Sodium: 134 mmol/L — ABNORMAL LOW (ref 135–145)
Total Protein: 8.1 g/dL (ref 6.5–8.1)

## 2017-06-27 LAB — TYPE AND SCREEN
ABO/RH(D): O POS
Antibody Screen: NEGATIVE

## 2017-06-27 LAB — ABO/RH: ABO/RH(D): O POS

## 2017-06-27 NOTE — Pre-Procedure Instructions (Signed)
DR. Cletus Gash MILLER VIEWED AND OKAY' D EKG

## 2017-06-27 NOTE — Patient Instructions (Addendum)
Your procedure is scheduled on: Thursday July 07, 2017 at 1:00 pm  Enter through the Main Entrance of [facility] at: 11:30 am  Pick up the phone at the desk and dial [phone].  Call this number if you have problems the morning of surgery: [phone].  Remember: Do NOT eat food: after Midnight on Wednesday March 20 Do NOT drink clear liquids after: 7:00 am Take these medicines the morning of surgery with a SIP OF WATER: Pravastatin, Amlodipine, Gabapentin Cymbalta Take KLONOPIN, FLEXERIL,, FLONASE SPRAY if needed,   Do NOT wear jewelry (body piercing), metal hair clips/bobby pins, make-up, or nail polish. Do NOT wear lotions, powders, or perfumes.  You may wear deoderant. Do NOT shave for 48 hours prior to surgery. Do NOT bring valuables to the hospital. Contacts, dentures, or bridgework may not be worn into surgery. Leave suitcase in car.  After surgery it may be brought to your room.  For patients admitted to the hospital, checkout time is 11:00 AM the day of discharge.

## 2017-06-28 NOTE — Progress Notes (Signed)
Subjective:  Patient ID: Isabel Evans, female    DOB: 09-Apr-1971,  MRN: [id number]  Chief Complaint  Patient presents with  . Foot Pain    bilateral, on the bottom under toes -Right 2nd and 5th toes, Left 2nd 3rd and 4th toes   47 y.o. female presents with the above complaint.  Reports feeling of knots in the bottom of both feet.  They hurt her to walk on.  States that she has a history of fibromuscular hand with Dupuytren's contracture.  States that she had surgery to alleviate these however the pain came back as well as fibromas.  Denies other pedal issues Past Medical History:  Diagnosis Date  . Anxiety   . Bone spur    cervical spine  . Chronic kidney disease 04/2017   kidney infections  . Depression   . Deviated nasal septum    states is unable to lie flat, because her nose will become congested and she can stop breathing  . Dysrhythmia    heart flutters occasionally  . Fibromyalgia   . GERD (gastroesophageal reflux disease)   . Hallux abductovalgus with bunions 09/2014   right   . Hammertoe 09/2014   right 4th, 5th  . History of stomach ulcers   . Hypertension    states is under control with med., has been on med. x 8 mos.  . IBS (irritable bowel syndrome)    no current med.  . Osteoarthritis    hands, bilateral hips  . Peripheral vascular disease (HCC)    occasional swelling in both legs  . Sinus headache    Past Surgical History:  Procedure Laterality Date  . BUNIONECTOMY Right 10/04/2014   Procedure:  Altamese Denmark, Emmaline Life;  Surgeon: Trula Slade, DPM;  Location: [facility];  Service: Podiatry;  Laterality: Right;  . COLONOSCOPY WITH PROPOFOL  02/13/2013  . DUPUYTREN / PALMAR FASCIOTOMY Right 07/18/2013   exc. of multiple nodules  . 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 HAMMER TOE SURGERY Right 10/04/2014   Procedure: HAMMER TOE REPAIR 4TH  AND 5TH  RIGHT FOOT  fourth toe fixation with k wire;   Surgeon: Trula Slade, DPM;  Location: [facility];  Service: Podiatry;  Laterality: Right;  . 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 TUBAL LIGATION      Current Outpatient Medications:  .  acetaminophen-codeine (TYLENOL #3) 300-30 MG tablet, Take 1 tablet by mouth every 6 (six) hours as needed for moderate pain., Disp: 90 tablet, Rfl: 0 .  amLODipine (NORVASC) 10 MG tablet, Take 1 tablet (10 mg total) by mouth daily., Disp: 90 tablet, Rfl: 3 .  clonazePAM (KLONOPIN) 0.5 MG tablet, Take 1 tablet (0.5 mg total) by mouth 2 (two) times daily as needed. for anxiety, Disp: 60 tablet, Rfl: 0 .  cyclobenzaprine (FLEXERIL) 10 MG tablet, Take 1 tablet (10 mg total) by mouth 3 (three) times daily as needed. for muscle spams, Disp: 60 tablet, Rfl: 1 .  diclofenac sodium (VOLTAREN) 1 % GEL, Apply 4 g topically 4 (four) times daily. (Patient taking differently: Apply 4 g topically daily as needed (pain). ), Disp: 1 Tube, Rfl: 2 .  dicyclomine (BENTYL) 10 MG capsule, Take 10 mg by mouth 4 (four) times daily as needed for spasms., Disp: , Rfl:  .  diphenhydrAMINE (BENADRYL) 25 MG tablet, Take 25 mg by mouth daily as needed for allergies., Disp: ,  Rfl:  .  DULoxetine (CYMBALTA) 60 MG capsule, Take 1 capsule (60 mg total) by mouth 2 (two) times daily., Disp: 180 capsule, Rfl: 3 .  fluticasone (FLONASE) 50 MCG/ACT nasal spray, Place 2 sprays into both nostrils daily. (Patient taking differently: Place 2 sprays into both nostrils 2 (two) times daily as needed for allergies. ), Disp: 16 g, Rfl: 3 .  gabapentin (NEURONTIN) 300 MG capsule, Take 2 capsules (600 mg total) by mouth 3 (three) times daily., Disp: 540 capsule, Rfl: 3 .  megestrol (MEGACE) 40 MG tablet, 1 tab po tid for first two days of period and then 1 tab po daily until period stops, Disp: 60 tablet, Rfl: 1 .  omeprazole (PRILOSEC) 20 MG capsule, Take 2 capsules (40 mg total) by mouth daily., Disp: 180 capsule, Rfl:  3 .  ondansetron (ZOFRAN-ODT) 8 MG disintegrating tablet, Take 1 tablet (8 mg total) by mouth every 8 (eight) hours as needed for nausea or vomiting., Disp: 20 tablet, Rfl: 0 .  pravastatin (PRAVACHOL) 40 MG tablet, Take 1 tablet (40 mg total) by mouth daily., Disp: 90 tablet, Rfl: 3  No Known Allergies Review of Systems Objective:  There were no vitals filed for this visit. General AA&O x3. Normal mood and affect.  Vascular Dorsalis pedis and posterior tibial pulses  present 2+ bilaterally  Capillary refill normal to all digits. Pedal hair growth normal.  Neurologic Epicritic sensation grossly present.  Dermatologic  painful fibrous masses plantar surface of both foot right second, fifth toes, left second, third, fourth toe. Interspaces clear of maceration. Nails well groomed and normal in appearance.  Orthopedic: MMT 5/5 in dorsiflexion, plantarflexion, inversion, and eversion. Normal joint ROM without pain or crepitus.   Assessment & Plan:  Patient was evaluated and treated and all questions answered.  Multiple fibromas -Injections consisting of 0.5 cc dexamethasone, 0.5 cc delivered to the right abnormal lesions about the right second and fifth metatarsal areas plantarly, left third metatarsal area plantarly discussed that these injections are only designed to decrease the pain and inflammation and possibly shrink the fibroids lesions however surgical cure would be the only definitive solution.  Advised against surgical excision due to recurrence of deformity in the hands  Return if symptoms worsen or fail to improve.

## 2017-06-29 MED ORDER — CLONAZEPAM 0.5 MG PO TABS: 0.5000 mg | ORAL_TABLET | Freq: Every evening | ORAL | 0 refills | Status: DC | PRN

## 2017-06-29 MED FILL — ACETAMINOPHEN/COD #3 TABLET: 300-30 | 30 days supply | Qty: 60 | Fill #0

## 2017-06-29 MED FILL — clonazePAM 0.5 MG TABS: 0.5 | 20 days supply | Qty: 20 | Fill #0

## 2017-06-29 NOTE — Telephone Encounter (Signed)
Rx for Klonopin and Tylenol #3 written while covering for Dr Doreene Burke to maintain stability. Doses decreased to reduce risk associated with combo.

## 2017-07-07 ENCOUNTER — Other Ambulatory Visit: Payer: Self-pay

## 2017-07-07 ENCOUNTER — Encounter (HOSPITAL_COMMUNITY): Payer: Self-pay

## 2017-07-07 ENCOUNTER — Encounter (HOSPITAL_COMMUNITY)
Admission: AD | Disposition: A | Discharge status: Home / Self Care | Payer: Self-pay | Source: Ambulatory Visit | Attending: Obstetrics and Gynecology

## 2017-07-07 ENCOUNTER — Ambulatory Visit (HOSPITAL_COMMUNITY): Payer: Self-pay | Admitting: Anesthesiology

## 2017-07-07 ENCOUNTER — Observation Stay (HOSPITAL_COMMUNITY)
Admission: AD | Admit: 2017-07-07 | Discharge: 2017-07-08 | Disposition: A | Discharge status: Home / Self Care | Payer: Self-pay | Source: Ambulatory Visit | Attending: Obstetrics and Gynecology | Admitting: Obstetrics and Gynecology

## 2017-07-07 ENCOUNTER — Other Ambulatory Visit: Payer: Self-pay | Admitting: Obstetrics and Gynecology

## 2017-07-07 DIAGNOSIS — F329 Major depressive disorder, single episode, unspecified: Secondary | ICD-10-CM | POA: Insufficient documentation

## 2017-07-07 DIAGNOSIS — D259 Leiomyoma of uterus, unspecified: Principal | ICD-10-CM | POA: Insufficient documentation

## 2017-07-07 DIAGNOSIS — Z8719 Personal history of other diseases of the digestive system: Secondary | ICD-10-CM | POA: Insufficient documentation

## 2017-07-07 DIAGNOSIS — N189 Chronic kidney disease, unspecified: Secondary | ICD-10-CM | POA: Insufficient documentation

## 2017-07-07 DIAGNOSIS — Z90710 Acquired absence of both cervix and uterus: Secondary | ICD-10-CM | POA: Diagnosis present

## 2017-07-07 DIAGNOSIS — I739 Peripheral vascular disease, unspecified: Secondary | ICD-10-CM | POA: Insufficient documentation

## 2017-07-07 DIAGNOSIS — F419 Anxiety disorder, unspecified: Secondary | ICD-10-CM | POA: Insufficient documentation

## 2017-07-07 DIAGNOSIS — K589 Irritable bowel syndrome without diarrhea: Secondary | ICD-10-CM | POA: Insufficient documentation

## 2017-07-07 DIAGNOSIS — I129 Hypertensive chronic kidney disease with stage 1 through stage 4 chronic kidney disease, or unspecified chronic kidney disease: Secondary | ICD-10-CM | POA: Insufficient documentation

## 2017-07-07 DIAGNOSIS — Z87891 Personal history of nicotine dependence: Secondary | ICD-10-CM | POA: Insufficient documentation

## 2017-07-07 DIAGNOSIS — Z79899 Other long term (current) drug therapy: Secondary | ICD-10-CM | POA: Insufficient documentation

## 2017-07-07 DIAGNOSIS — K219 Gastro-esophageal reflux disease without esophagitis: Secondary | ICD-10-CM | POA: Insufficient documentation

## 2017-07-07 DIAGNOSIS — N920 Excessive and frequent menstruation with regular cycle: Secondary | ICD-10-CM | POA: Insufficient documentation

## 2017-07-07 DIAGNOSIS — M797 Fibromyalgia: Secondary | ICD-10-CM | POA: Insufficient documentation

## 2017-07-07 HISTORY — PX: VAGINAL HYSTERECTOMY: SHX2639

## 2017-07-07 HISTORY — PX: CYSTOSCOPY: SHX5120

## 2017-07-07 LAB — PREGNANCY, URINE: PREG TEST UR: NEGATIVE

## 2017-07-07 SURGERY — HYSTERECTOMY, VAGINAL
Anesthesia: General | Site: Vagina

## 2017-07-07 MED ORDER — PROPOFOL 10 MG/ML IV BOLUS
INTRAVENOUS | Status: DC | PRN
  Administered 2017-07-07: 200 mg via INTRAVENOUS
  Administered 2017-07-07: 20 mg via INTRAVENOUS

## 2017-07-07 MED ORDER — PROMETHAZINE HCL 25 MG/ML IJ SOLN
6.2500 mg | INTRAMUSCULAR | Status: DC | PRN
  Administered 2017-07-07: 6.25 mg via INTRAVENOUS

## 2017-07-07 MED ORDER — CLONAZEPAM 0.5 MG PO TABS
0.5000 mg | ORAL_TABLET | Freq: Two times a day (BID) | ORAL | Status: DC | PRN
  Administered 2017-07-07: 0.5 mg via ORAL
  Filled 2017-07-07: qty 1

## 2017-07-07 MED ORDER — CEFAZOLIN SODIUM-DEXTROSE 2-4 GM/100ML-% IV SOLN
2.0000 g | INTRAVENOUS | Status: AC
  Administered 2017-07-07: 2 g via INTRAVENOUS

## 2017-07-07 MED ORDER — PANTOPRAZOLE SODIUM 40 MG PO TBEC: 40.0000 mg | DELAYED_RELEASE_TABLET | Freq: Every day | ORAL | Status: DC

## 2017-07-07 MED ORDER — SCOPOLAMINE 1 MG/3DAYS TD PT72
1.0000 | MEDICATED_PATCH | Freq: Once | TRANSDERMAL | Status: DC
  Administered 2017-07-07: 1.5 mg via TRANSDERMAL

## 2017-07-07 MED ORDER — SOD CITRATE-CITRIC ACID 500-334 MG/5ML PO SOLN
ORAL | Status: AC
  Administered 2017-07-07: 30 mL via ORAL
  Filled 2017-07-07: qty 15

## 2017-07-07 MED ORDER — DEXAMETHASONE SODIUM PHOSPHATE 10 MG/ML IJ SOLN
INTRAMUSCULAR | Status: DC | PRN
  Administered 2017-07-07: 10 mg via INTRAVENOUS

## 2017-07-07 MED ORDER — MEPERIDINE HCL 25 MG/ML IJ SOLN: 6.2500 mg | INTRAMUSCULAR | Status: DC | PRN

## 2017-07-07 MED ORDER — LACTATED RINGERS IV SOLN
INTRAVENOUS | Status: DC
  Administered 2017-07-07: 17:00:00 via INTRAVENOUS

## 2017-07-07 MED ORDER — ONDANSETRON HCL 4 MG/2ML IJ SOLN
INTRAMUSCULAR | Status: AC
  Filled 2017-07-07: qty 2

## 2017-07-07 MED ORDER — HYDROMORPHONE HCL 1 MG/ML IJ SOLN
INTRAMUSCULAR | Status: AC
  Filled 2017-07-07: qty 1

## 2017-07-07 MED ORDER — SIMETHICONE 80 MG PO CHEW
80.0000 mg | CHEWABLE_TABLET | Freq: Three times a day (TID) | ORAL | Status: DC
  Administered 2017-07-07: 80 mg via ORAL
  Filled 2017-07-07: qty 1

## 2017-07-07 MED ORDER — LIDOCAINE HCL (CARDIAC) 20 MG/ML IV SOLN
INTRAVENOUS | Status: DC | PRN
  Administered 2017-07-07: 100 mg via INTRAVENOUS

## 2017-07-07 MED ORDER — CEFAZOLIN SODIUM-DEXTROSE 2-4 GM/100ML-% IV SOLN
INTRAVENOUS | Status: AC
  Filled 2017-07-07: qty 100

## 2017-07-07 MED ORDER — MIDAZOLAM HCL 2 MG/2ML IJ SOLN
INTRAMUSCULAR | Status: AC
  Filled 2017-07-07: qty 2

## 2017-07-07 MED ORDER — HYDROMORPHONE HCL 1 MG/ML IJ SOLN
0.2000 mg | INTRAMUSCULAR | Status: AC | PRN
  Administered 2017-07-07: 0.5 mg via INTRAVENOUS
  Administered 2017-07-07: 0.6 mg via INTRAVENOUS
  Filled 2017-07-07 (×2): qty 1

## 2017-07-07 MED ORDER — SOD CITRATE-CITRIC ACID 500-334 MG/5ML PO SOLN
30.0000 mL | ORAL | Status: AC
  Administered 2017-07-07: 30 mL via ORAL

## 2017-07-07 MED ORDER — MIDAZOLAM HCL 2 MG/2ML IJ SOLN
INTRAMUSCULAR | Status: DC | PRN
  Administered 2017-07-07 (×2): 1 mg via INTRAVENOUS

## 2017-07-07 MED ORDER — HYDROMORPHONE HCL 1 MG/ML IJ SOLN
INTRAMUSCULAR | Status: AC
  Administered 2017-07-07: 0.5 mg via INTRAVENOUS
  Filled 2017-07-07: qty 1

## 2017-07-07 MED ORDER — LIDOCAINE HCL 1 % IJ SOLN
INTRAMUSCULAR | Status: AC
  Filled 2017-07-07: qty 20

## 2017-07-07 MED ORDER — LACTATED RINGERS IV SOLN
INTRAVENOUS | Status: DC
  Administered 2017-07-07 (×3): via INTRAVENOUS

## 2017-07-07 MED ORDER — FENTANYL CITRATE (PF) 100 MCG/2ML IJ SOLN: INTRAMUSCULAR | Status: DC | PRN

## 2017-07-07 MED ORDER — GLYCOPYRROLATE 0.2 MG/ML IJ SOLN
INTRAMUSCULAR | Status: DC | PRN
  Administered 2017-07-07: 0.1 mg via INTRAVENOUS

## 2017-07-07 MED ORDER — HYDROMORPHONE HCL 1 MG/ML IJ SOLN
INTRAMUSCULAR | Status: DC | PRN
  Administered 2017-07-07: 1 mg via INTRAVENOUS

## 2017-07-07 MED ORDER — GABAPENTIN 300 MG PO CAPS
600.0000 mg | ORAL_CAPSULE | Freq: Three times a day (TID) | ORAL | Status: DC
  Administered 2017-07-07 – 2017-07-08 (×2): 600 mg via ORAL
  Filled 2017-07-07 (×3): qty 2

## 2017-07-07 MED ORDER — OXYCODONE HCL 5 MG PO TABS
5.0000 mg | ORAL_TABLET | Freq: Four times a day (QID) | ORAL | Status: DC | PRN
  Administered 2017-07-07: 10 mg via ORAL
  Administered 2017-07-07: 5 mg via ORAL
  Administered 2017-07-08: 10 mg via ORAL
  Filled 2017-07-07 (×2): qty 2
  Filled 2017-07-07: qty 1

## 2017-07-07 MED ORDER — PROCHLORPERAZINE EDISYLATE 5 MG/ML IJ SOLN
10.0000 mg | Freq: Four times a day (QID) | INTRAMUSCULAR | Status: DC | PRN
  Filled 2017-07-07: qty 2

## 2017-07-07 MED ORDER — ONDANSETRON HCL 4 MG/2ML IJ SOLN
INTRAMUSCULAR | Status: DC | PRN
  Administered 2017-07-07: 4 mg via INTRAVENOUS

## 2017-07-07 MED ORDER — FENTANYL CITRATE (PF) 250 MCG/5ML IJ SOLN
INTRAMUSCULAR | Status: AC
Start: 2017-07-07 — End: ?
  Filled 2017-07-07: qty 5

## 2017-07-07 MED ORDER — SUGAMMADEX SODIUM 200 MG/2ML IV SOLN
INTRAVENOUS | Status: AC
  Filled 2017-07-07: qty 2

## 2017-07-07 MED ORDER — DULOXETINE HCL 60 MG PO CPEP
60.0000 mg | ORAL_CAPSULE | Freq: Two times a day (BID) | ORAL | Status: DC
  Administered 2017-07-07: 60 mg via ORAL
  Filled 2017-07-07 (×2): qty 1

## 2017-07-07 MED ORDER — LIDOCAINE-EPINEPHRINE 1 %-1:100000 IJ SOLN
INTRAMUSCULAR | Status: DC | PRN
  Administered 2017-07-07: 7 mL

## 2017-07-07 MED ORDER — ROCURONIUM BROMIDE 100 MG/10ML IV SOLN
INTRAVENOUS | Status: DC | PRN
  Administered 2017-07-07: 10 mg via INTRAVENOUS
  Administered 2017-07-07: 40 mg via INTRAVENOUS
  Administered 2017-07-07: 10 mg via INTRAVENOUS

## 2017-07-07 MED ORDER — SCOPOLAMINE 1 MG/3DAYS TD PT72
MEDICATED_PATCH | TRANSDERMAL | Status: AC
  Administered 2017-07-07: 1.5 mg via TRANSDERMAL
  Filled 2017-07-07: qty 1

## 2017-07-07 MED ORDER — AMLODIPINE BESYLATE 10 MG PO TABS: 10.0000 mg | ORAL_TABLET | Freq: Every day | ORAL | Status: DC

## 2017-07-07 MED ORDER — LACTATED RINGERS IV SOLN: INTRAVENOUS | Status: DC

## 2017-07-07 MED ORDER — FENTANYL CITRATE (PF) 100 MCG/2ML IJ SOLN
INTRAMUSCULAR | Status: DC | PRN
  Administered 2017-07-07 (×5): 50 ug via INTRAVENOUS

## 2017-07-07 MED ORDER — HYDROMORPHONE HCL 1 MG/ML IJ SOLN
0.2500 mg | INTRAMUSCULAR | Status: DC | PRN
  Administered 2017-07-07 (×2): 0.5 mg via INTRAVENOUS

## 2017-07-07 MED ORDER — PROPOFOL 10 MG/ML IV BOLUS
INTRAVENOUS | Status: AC
  Filled 2017-07-07: qty 20

## 2017-07-07 MED ORDER — ACETAMINOPHEN 325 MG PO TABS
650.0000 mg | ORAL_TABLET | Freq: Four times a day (QID) | ORAL | Status: DC | PRN
  Administered 2017-07-07 – 2017-07-08 (×2): 650 mg via ORAL
  Filled 2017-07-07 (×2): qty 2

## 2017-07-07 MED ORDER — POLYETHYLENE GLYCOL 3350 17 G PO PACK
17.0000 g | PACK | Freq: Every day | ORAL | Status: DC
  Filled 2017-07-07: qty 1

## 2017-07-07 MED ORDER — STERILE WATER FOR IRRIGATION IR SOLN
Status: DC | PRN
  Administered 2017-07-07: 1000 mL via INTRAVESICAL

## 2017-07-07 MED ORDER — LIDOCAINE-EPINEPHRINE 1 %-1:100000 IJ SOLN
INTRAMUSCULAR | Status: AC
  Filled 2017-07-07: qty 1

## 2017-07-07 MED ORDER — DICYCLOMINE HCL 10 MG PO CAPS
10.0000 mg | ORAL_CAPSULE | Freq: Four times a day (QID) | ORAL | Status: DC | PRN
  Filled 2017-07-07: qty 1

## 2017-07-07 MED ORDER — LIDOCAINE HCL (CARDIAC) 20 MG/ML IV SOLN
INTRAVENOUS | Status: AC
  Filled 2017-07-07: qty 5

## 2017-07-07 MED ORDER — SUGAMMADEX SODIUM 200 MG/2ML IV SOLN
INTRAVENOUS | Status: DC | PRN
  Administered 2017-07-07: 180 mg via INTRAVENOUS

## 2017-07-07 MED ORDER — DEXAMETHASONE SODIUM PHOSPHATE 10 MG/ML IJ SOLN
INTRAMUSCULAR | Status: AC
  Filled 2017-07-07: qty 1

## 2017-07-07 MED ORDER — PROMETHAZINE HCL 25 MG/ML IJ SOLN
INTRAMUSCULAR | Status: AC
  Administered 2017-07-07: 6.25 mg via INTRAVENOUS
  Filled 2017-07-07: qty 1

## 2017-07-07 SURGICAL SUPPLY — 29 items
CANISTER SUCT 3000ML PPV (MISCELLANEOUS) ×4 IMPLANT
CONT PATH 16OZ SNAP LID 3702 (MISCELLANEOUS) ×4 IMPLANT
DECANTER SPIKE VIAL GLASS SM (MISCELLANEOUS) ×4 IMPLANT
DRAPE SHEET LG 3/4 BI-LAMINATE (DRAPES) ×4 IMPLANT
ELECT CAUTERY BLADE 6.4 (BLADE) ×4 IMPLANT
ELECT REM PT RETURN 9FT ADLT (ELECTROSURGICAL) ×4
ELECTRODE REM PT RTRN 9FT ADLT (ELECTROSURGICAL) ×2 IMPLANT
GAUZE PACKING 2X5 YD STRL (GAUZE/BANDAGES/DRESSINGS) IMPLANT
GAUZE SPONGE 4X4 16PLY XRAY LF (GAUZE/BANDAGES/DRESSINGS) ×4 IMPLANT
GLOVE BIOGEL PI IND STRL 7.5 (GLOVE) ×2 IMPLANT
GLOVE BIOGEL PI INDICATOR 7.5 (GLOVE) ×2
GLOVE SURG SS PI 7.0 STRL IVOR (GLOVE) ×8 IMPLANT
GOWN STRL REUS W/TWL LRG LVL3 (GOWN DISPOSABLE) ×16 IMPLANT
NS IRRIG 1000ML POUR BTL (IV SOLUTION) ×4 IMPLANT
PACK TRENDGUARD 600 HYBRD PROC (MISCELLANEOUS) IMPLANT
PACK VAGINAL WOMENS (CUSTOM PROCEDURE TRAY) ×4 IMPLANT
PAD OB MATERNITY 4.3X12.25 (PERSONAL CARE ITEMS) ×4 IMPLANT
SET CYSTO W/LG BORE CLAMP LF (SET/KITS/TRAYS/PACK) ×4 IMPLANT
SUT ETHIBOND NAB CT1 #1 30IN (SUTURE) IMPLANT
SUT VIC AB 0 CT1 27 (SUTURE) ×6
SUT VIC AB 0 CT1 27XCR 8 STRN (SUTURE) ×6 IMPLANT
SUT VIC AB 0 CT1 36 (SUTURE) IMPLANT
SUT VIC AB 1 CT1 36 (SUTURE) ×4 IMPLANT
SUT VIC AB 2-0 CT1 (SUTURE) IMPLANT
SYR 10ML LL (SYRINGE) ×4 IMPLANT
SYR BULB IRRIGATION 50ML (SYRINGE) ×4 IMPLANT
TOWEL OR 17X24 6PK STRL BLUE (TOWEL DISPOSABLE) ×8 IMPLANT
TRAY FOLEY CATH SILVER 14FR (SET/KITS/TRAYS/PACK) ×4 IMPLANT
TRENDGUARD 600 HYBRID PROC PK (MISCELLANEOUS)

## 2017-07-07 NOTE — Anesthesia Preprocedure Evaluation (Addendum)
Anesthesia Evaluation  Patient identified by MRN, date of birth, ID band Patient awake    Reviewed: Allergy & Precautions, NPO status , Patient's Chart, lab work & pertinent test results  Airway Mallampati: I  TM Distance: >3 FB Neck ROM: Full    Dental  (+) Dental Advisory Given, Missing,    Pulmonary former smoker,    breath sounds clear to auscultation       Cardiovascular hypertension, Pt. on medications + Peripheral Vascular Disease  + dysrhythmias  Rhythm:Regular Rate:Normal     Neuro/Psych  Headaches, PSYCHIATRIC DISORDERS Anxiety Depression  Neuromuscular disease    GI/Hepatic Neg liver ROS, GERD  Medicated,  Endo/Other  negative endocrine ROS  Renal/GU      Musculoskeletal  (+) Arthritis , Fibromyalgia -  Abdominal Normal abdominal exam  (+)   Peds  Hematology  (+) anemia ,   Anesthesia Other Findings - IBS  - No active cardiac symptoms  Reproductive/Obstetrics                           Anesthesia Physical Anesthesia Plan  ASA: II  Anesthesia Plan: General   Post-op Pain Management:    Induction: Intravenous  PONV Risk Score and Plan: 4 or greater and Ondansetron  Airway Management Planned: Oral ETT  Additional Equipment: None  Intra-op Plan:   Post-operative Plan: Extubation in OR  Informed Consent: I have reviewed the patients History and Physical, chart, labs and discussed the procedure including the risks, benefits and alternatives for the proposed anesthesia with the patient or authorized representative who has indicated his/her understanding and acceptance.   Dental advisory given  Plan Discussed with: CRNA  Anesthesia Plan Comments:        Anesthesia Quick Evaluation

## 2017-07-07 NOTE — Anesthesia Postprocedure Evaluation (Signed)
Anesthesia Post Note  Patient: Dakayla Disanti  Procedure(s) Performed: HYSTERECTOMY VAGINAL (N/A Vagina ) CYSTOSCOPY (N/A Bladder)     Patient location during evaluation: PACU Anesthesia Type: General Level of consciousness: awake and alert Pain management: pain level controlled Vital Signs Assessment: post-procedure vital signs reviewed and stable Respiratory status: spontaneous breathing, nonlabored ventilation, respiratory function stable and patient connected to nasal cannula oxygen Cardiovascular status: blood pressure returned to baseline and stable Postop Assessment: no apparent nausea or vomiting Anesthetic complications: no    Last Vitals:  Vitals:   07/07/17 1600 07/07/17 1615  BP: 115/77 118/81  Pulse: 91 88  Resp: 15 16  Temp: 36.7 C 36.7 C  SpO2: 95% 96%    Last Pain:  Vitals:   07/07/17 1600  TempSrc:   PainSc: 3    Pain Goal: Patients Stated Pain Goal: 3 (07/07/17 1120)               Effie Berkshire

## 2017-07-07 NOTE — Anesthesia Procedure Notes (Addendum)
Procedure Name: Intubation Date/Time: 07/07/2017 1:01 PM Performed by: Flossie Dibble, CRNA Pre-anesthesia Checklist: Patient identified, Patient being monitored, Timeout performed, Emergency Drugs available and Suction available Patient Re-evaluated:Patient Re-evaluated prior to induction Oxygen Delivery Method: Circle System Utilized Preoxygenation: Pre-oxygenation with 100% oxygen Induction Type: IV induction Ventilation: Mask ventilation without difficulty and Oral airway inserted - appropriate to patient size Laryngoscope Size: Mac and 3 Grade View: Grade I Tube type: Oral Tube size: 7.0 mm Number of attempts: 1 Airway Equipment and Method: stylet Placement Confirmation: ETT inserted through vocal cords under direct vision,  positive ETCO2 and breath sounds checked- equal and bilateral Secured at: 21 cm Tube secured with: Tape Dental Injury: Teeth and Oropharynx as per pre-operative assessment

## 2017-07-07 NOTE — Transfer of Care (Signed)
Immediate Anesthesia Transfer of Care Note  Patient: Isabel Evans  Procedure(s) Performed: HYSTERECTOMY VAGINAL (N/A Vagina ) CYSTOSCOPY (N/A Bladder)  Patient Location: PACU  Anesthesia Type:General  Level of Consciousness: awake, alert  and oriented  Airway & Oxygen Therapy: Patient Spontanous Breathing and Patient connected to nasal cannula oxygen  Post-op Assessment: Report given to RN and Post -op Vital signs reviewed and stable  Post vital signs: Reviewed and stable  Last Vitals:  Vitals Value Taken Time  BP 118/84 07/07/2017  2:53 PM  Temp    Pulse 96 07/07/2017  3:00 PM  Resp 15 07/07/2017  3:00 PM  SpO2 97 % 07/07/2017  3:00 PM  Vitals shown include unvalidated device data.  Last Pain:  Vitals:   07/07/17 1120  TempSrc: Oral      Patients Stated Pain Goal: 3 (15/61/53 7943)  Complications: No apparent anesthesia complications

## 2017-07-07 NOTE — Op Note (Addendum)
Operative Note   07/07/2017  PRE-OP DIAGNOSIS: Abnormal uterine bleeding   POST-OP DIAGNOSIS: Same.   SURGEON: Surgeon(s) and Role:    * Carrye Goller, Eduard Clos, MD - Primary  ASSISTANT:    * Hulan Fray, Myra C, MD - Assisting  ANESTHESIA: General   PROCEDURE: Total vaginal hysterectomy, cystoscopy  ESTIMATED BLOOD LOSS: 344mL  DRAINS: 213mL UOP via indwelling foley   TOTAL IV FLUIDS: 2075mL crystalloid  SPECIMENS: cervix, uterus  VTE PROPHYLAXIS: SCDs to the bilateral lower extremities  ANTIBIOTICS: Two grams of Cefazolin were given within one hour of incision  COMPLICATIONS: None  DISPOSITION: PACU - hemodynamically stable.  CONDITION: stable  FINDINGS: Exam under anesthesia revealed an approximately 8 week size anteverted uterus that was relatively  with good decensus.  Normal ovaries bilaterally via  Visualization. Fallopian tubes not seen. Normal urethera, bladder, and bilateral ureteral orifices with + flow from each   PROCEDURE IN DETAIL: The patient was taken to the operating room where general anesthesia was administered. An exam under anesthesia was performed with the above-noted findings. She was then prepped and draped in the usual sterile fashion in the dorsal lithotomy position with the Allen stirrups and a foley catheter placed  A weighted speculum was placed in the vagina and a Deaver placed anteriorly.  The cervix was grasped with two single-tooth tenaculums. Next, the cervical vaginal epithelium was incised anteriorly with the scalpel. The pubovesical cervical fascia was incised with the Mayo scissors and the bladder mobilized cephalad. The peritoneum was identified and entered sharply with Metzenbaum scissors and the retractor placed into the peritoneal space to retract the bladder anteriorly. A posterior colpotomy incision was made with Mayo scissors and the rectovaginal space was entered. The weighted speculum was then replaced. In a sequential fashion the uterosacral  ligaments, the cardinal ligaments and the uterine arteries were clamped, transected, and suture ligated with 0 Vicryl. The anterior and posterior broad ligaments on either side of the uterus were serially clamped, transected, and suture ligated with 0 vicryl until the utero-ovarian ligaments were encountered bilaterally. To help visualize the UOs a large portion of the uterus was incised and removed from the mid fundal portion. This allowed the UOs to be seen and these were cross-clamped, transected, and doubly suture ligated with a transfixion stitch of 0 Vicryl bilaterally. Excellent hemostasis was noted.  The ovaries and fallopian tubes were inspected with the above-noted findings. Next, a modified culdoplasty stitch, which included the cardinal ligaments bilaterally, was placed using 0 Vicryl as well as the uterosacral ligaments. The vaginal cuff was then closed using 0 Vicryl in a running locking fashion. Excellent hemostasis was noted. A cystoscopy was performed with the above noted findings.   The patient tolerated the procedure well. All sponge, lap, needle, and instrument counts were correct x2. She was taken to the recovery room in stable condition.  Durene Romans MD Attending Center for [employer] [facility])

## 2017-07-07 NOTE — H&P (Signed)
Obstetrics & Gynecology H&P   Date of Admission: 07/07/2017   Primary OBGYN: [facility] Primary Care Provider: Tresa Garter  Reason for Admission: scheduled surgery  History of Present Illness: Isabel Evans is a 47 y.o. [phone] (Patient's last menstrual period was 07/04/2017.), with the above CC.  Patient with continued AUB and d/w her previously re: options and she desired definitive surgical management.     ROS: A 12-point review of systems was performed and negative, except as stated in the above HPI.  OBGYN History: As per HPI. OB History  Gravida Para Term Preterm AB Living  3 3 3  0 0 3  SAB TAB Ectopic Multiple Live Births  0 0 0 0      # Outcome Date GA Lbr Len/2nd Weight Sex Delivery Anes PTL Lv  3 Term           2 Term           1 Term             Obstetric Comments  SVD x 3   Periods: qmonth, regular, 3-5d. They have been getting more painful and heavier in the past few months. Sometimes has some bleeding after intercourse but no intermenstrual bleeding aside from this History of Pap Smear(s): Yes.   Last pap 2018, which was NILM/HPV neg Endometrial Biopsy: negative 2018 She is currently using BTL for contraception.    Past Medical History: Past Medical History:  Diagnosis Date  . Anxiety   . Bone spur    cervical spine  . Chronic kidney disease 04/2017   kidney infections  . Depression   . Deviated nasal septum    states is unable to lie flat, because her nose will become congested and she can stop breathing  . Dysrhythmia    heart flutters occasionally  . Fibromyalgia   . GERD (gastroesophageal reflux disease)   . Hallux abductovalgus with bunions 09/2014   right   . Hammertoe 09/2014   right 4th, 5th  . History of stomach ulcers   . Hypertension    states is under control with med., has been on med. x 8 mos.  . IBS (irritable bowel syndrome)    no current med.  . Osteoarthritis    hands, bilateral hips  . Peripheral  vascular disease (HCC)    occasional swelling in both legs  . Sinus headache     Past Surgical History: Past Surgical History:  Procedure Laterality Date  . BUNIONECTOMY Right 10/04/2014   Procedure:  Altamese Stoy, Emmaline Life;  Surgeon: Trula Slade, DPM;  Location: [facility];  Service: Podiatry;  Laterality: Right;  . COLONOSCOPY WITH PROPOFOL  02/13/2013  . DUPUYTREN / PALMAR FASCIOTOMY Right 07/18/2013   exc. of multiple nodules  . 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 HAMMER TOE SURGERY Right 10/04/2014   Procedure: HAMMER TOE REPAIR 4TH  AND 5TH  RIGHT FOOT  fourth toe fixation with k wire;  Surgeon: Trula Slade, DPM;  Location: [facility];  Service: Podiatry;  Laterality: Right;  . 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 TUBAL LIGATION      Family History:  Family History  Problem Relation Age of Onset  . Hypertension Mother   . Diabetes Mother   . Heart disease Mother   . Hypertension Father   . Diabetes Father   . Prostate cancer  Father   . Heart disease Father   . Alcohol abuse Father   . Breast cancer Sister   . Hypertension Sister   . Diabetes Sister   . Heart disease Maternal Grandmother        Great GM  . Breast cancer Maternal Grandmother   . Bone cancer Maternal Grandmother   . Breast cancer Maternal Aunt   . Ovarian cancer Maternal Aunt   . Colon cancer Maternal Aunt        great aunt  . Rheum arthritis Sister   . Colon cancer Son    Breast cancer diagnosis in her 45s. Pt only endorsed her maternal aunt as having br cancer    Social History:  Social History   Socioeconomic History  . Marital status: Single    Spouse name: Not on file  . Number of children: 3  . Years of education: Not on file  . Highest education level: Not on file  Occupational History  . Occupation: Advertising account executive: [employer]  Social Needs  . Financial resource  strain: Not on file  . Food insecurity:    Worry: Not on file    Inability: Not on file  . Transportation needs:    Medical: Not on file    Non-medical: Not on file  Tobacco Use  . Smoking status: Former Smoker    Last attempt to quit: 08/24/2012    Years since quitting: 4.8  . Smokeless tobacco: Never Used  Substance and Sexual Activity  . Alcohol use: Yes    Comment: occasionally  . Drug use: No  . Sexual activity: Yes    Birth control/protection: Surgical  Lifestyle  . Physical activity:    Days per week: Not on file    Minutes per session: Not on file  . Stress: Not on file  Relationships  . Social connections:    Talks on phone: Not on file    Gets together: Not on file    Attends religious service: Not on file    Active member of club or organization: Not on file    Attends meetings of clubs or organizations: Not on file    Relationship status: Not on file  . Intimate partner violence:    Fear of current or ex partner: Not on file    Emotionally abused: Not on file    Physically abused: Not on file    Forced sexual activity: Not on file  Other Topics Concern  . Not on file  Social History Narrative  . Not on file    Health Maintenance:  Mammogram: 2018 negative  Allergy: No Known Allergies  Current Outpatient Medications: Medications Prior to Admission  Medication Sig Dispense Refill Last Dose  . acetaminophen-codeine (TYLENOL #3) 300-30 MG tablet Take 1 tablet by mouth every 12 (twelve) hours as needed for moderate pain. 60 tablet 0 07/07/2017 at 1000  . amLODipine (NORVASC) 10 MG tablet Take 1 tablet (10 mg total) by mouth daily. 90 tablet 3 07/07/2017 at 1000  . clonazePAM (KLONOPIN) 0.5 MG tablet Take 1 tablet (0.5 mg total) by mouth at bedtime as needed. for anxiety 20 tablet 0 07/06/2017 at Unknown time  . cyclobenzaprine (FLEXERIL) 10 MG tablet Take 1 tablet (10 mg total) by mouth 3 (three) times daily as needed. for muscle spams 60 tablet 1 07/07/2017 at  1000  . diclofenac sodium (VOLTAREN) 1 % GEL Apply 4 g topically 4 (four) times daily. (Patient taking differently: Apply 4 g  topically daily as needed (pain). ) 1 Tube 2 Past Week at Unknown time  . dicyclomine (BENTYL) 10 MG capsule Take 10 mg by mouth 4 (four) times daily as needed for spasms.   07/07/2017 at 1000  . diphenhydrAMINE (BENADRYL) 25 MG tablet Take 25 mg by mouth daily as needed for allergies.   07/06/2017 at Unknown time  . DULoxetine (CYMBALTA) 60 MG capsule Take 1 capsule (60 mg total) by mouth 2 (two) times daily. 180 capsule 3 07/07/2017 at 1000  . fluticasone (FLONASE) 50 MCG/ACT nasal spray Place 2 sprays into both nostrils daily. (Patient taking differently: Place 2 sprays into both nostrils 2 (two) times daily as needed for allergies. ) 16 g 3 07/07/2017 at 1000  . gabapentin (NEURONTIN) 300 MG capsule Take 2 capsules (600 mg total) by mouth 3 (three) times daily. 540 capsule 3 07/07/2017 at 1000  . megestrol (MEGACE) 40 MG tablet 1 tab po tid for first two days of period and then 1 tab po daily until period stops 60 tablet 1 07/07/2017 at 1000  . omeprazole (PRILOSEC) 20 MG capsule Take 2 capsules (40 mg total) by mouth daily. 180 capsule 3 07/07/2017 at 1000  . ondansetron (ZOFRAN-ODT) 8 MG disintegrating tablet Take 1 tablet (8 mg total) by mouth every 8 (eight) hours as needed for nausea or vomiting. 20 tablet 0 07/06/2017 at Unknown time  . pravastatin (PRAVACHOL) 40 MG tablet Take 1 tablet (40 mg total) by mouth daily. 90 tablet 3 07/07/2017 at 1000     Hospital Medications: Current Facility-Administered Medications  Medication Dose Route Frequency Provider Last Rate Last Dose  . ceFAZolin (ANCEF) 2-4 GM/100ML-% IVPB           . ceFAZolin (ANCEF) IVPB 2g/100 mL premix  2 g Intravenous On Call to Ashley, MD      . lactated ringers infusion   Intravenous Continuous Aletha Halim, MD 125 mL/hr at 07/07/17 1135    . scopolamine (TRANSDERM-SCOP) 1 MG/3DAYS 1.5 mg   1 patch Transdermal Once Janeece Riggers, MD   1.5 mg at 07/07/17 1133     Physical Exam:   Current Vital Signs 24h Vital Sign Ranges  T 98.2 F (36.8 C) Temp  Avg: 98.2 F (36.8 C)  Min: 98.2 F (36.8 C)  Max: 98.2 F (36.8 C)  BP 123/84 BP  Min: 123/84  Max: 123/84  HR 90 Pulse  Avg: 90  Min: 90  Max: 90  RR 16 Resp  Avg: 16  Min: 16  Max: 16  SaO2 100 % Room Air SpO2  Avg: 100 %  Min: 100 %  Max: 100 %       24 Hour I/O Current Shift I/O  Time Ins Outs No intake/output data recorded. No intake/output data recorded.   Patient Vitals for the past 24 hrs:  BP Temp Temp src Pulse Resp SpO2  07/07/17 1120 123/84 98.2 F (36.8 C) Oral 90 16 100 %    General appearance: Well nourished, well developed female in no acute distress.  Neck:  Supple, normal appearance, and no thyromegaly  Cardiovascular: normal s1 and s2.  No murmurs, rubs or gallops. Respiratory:  Clear to auscultation bilateral. Normal respiratory effort Abdomen: positive bowel sounds and no masses, hernias; diffusely non tender to palpation, non distended Neuro/Psych:  Normal mood and affect.  Skin:  Warm and dry.  Lymphatic:  No inguinal lymphadenopathy.   Pelvic exam from 12/2016 Pelvic exam: is not limited by body  habitus EGBUS: within normal limits, Vagina: within normal limits and with scant old blood mixed with mucus in the vault, Cervix: normal appearing cervix without tenderness, discharge or lesions. Uterus:  nonenlarged and non tender and Adnexa:  normal adnexa and no mass, fullness, tenderness Rectovaginal: deferred   Laboratory: CBC Latest Ref Rng & Units 06/27/2017 12/13/2016 11/10/2016  WBC 4.0 - 10.5 K/uL 9.1 7.6 5.8  Hemoglobin 12.0 - 15.0 g/dL 10.6(L) 10.5(L) 12.0  Hematocrit 36.0 - 46.0 % 34.3(L) 32.0(L) 37.5  Platelets 150 - 400 K/uL 393 308 354   CMP Latest Ref Rng & Units 06/27/2017 11/10/2016 09/22/2016  Glucose 65 - 99 mg/dL 102(H) - 103(H)  BUN 6 - 20 mg/dL 12 - 8  Creatinine 0.44 - 1.00  mg/dL 0.83 - 0.79  Sodium 135 - 145 mmol/L 134(L) - 140  Potassium 3.5 - 5.1 mmol/L 3.9 - 4.3  Chloride 101 - 111 mmol/L 99(L) - 100  CO2 22 - 32 mmol/L 24 - 29  Calcium 8.9 - 10.3 mg/dL 9.5 - 9.4  Total Protein 6.5 - 8.1 g/dL 8.1 6.8 7.2  Total Bilirubin 0.3 - 1.2 mg/dL 0.6 <0.2 <0.2  Alkaline Phos 38 - 126 U/L 77 78 79  AST 15 - 41 U/L 19 17 15   ALT 14 - 54 U/L 19 10 12    Conflict (See Lab Report): O POS/O POS Performed at [facility], [address] [place],  [address]   Imaging:  ECG unchanged  Assessment: Ms. Molock is a 47 y.o. [phone] (Patient's last menstrual period was 07/04/2017.) with persistent AUB  Plan: Pt desires to proceed with hysterectomy. Pt consented for TVH/BS/cysto Can proceed when OR is ready.   Durene Romans MD Attending Center for [facility])

## 2017-07-07 NOTE — Anesthesia Procedure Notes (Signed)
Performed by: Mary Secord O, CRNA       

## 2017-07-07 NOTE — Progress Notes (Signed)
GYN Post Op Check Note  07/07/2017 - 5:13 PM  Brief Clinical History: 47 y.o. POD#0 s/p TVH/cysto (EBL 300 mL) for AUB.   PMHx significant for HTN, PVD, anxiety, fibromyalgia, IBS.  Subjective: feels well, a little sore and crampy. Hasn't taken much po or voided yet. Pain controlled  Objective:  Current Vital Signs 24h Vital Sign Ranges  T 98.1 F (36.7 C) Temp  Avg: 98.2 F (36.8 C)  Min: 98.1 F (36.7 C)  Max: 98.3 F (36.8 C)  BP 118/81 BP  Min: 115/77  Max: 123/84  HR 88 Pulse  Avg: 92.9  Min: 88  Max: 99  RR 16 Resp  Avg: 15.1  Min: 11  Max: 16  SaO2 96 % Nasal Cannula SpO2  Avg: 97.4 %  Min: 95 %  Max: 100 %       24 Hour I/O Current Shift I/O  Time Ins Outs No intake/output data recorded. 03/21 0701 - 03/21 1900 In: 2000 [I.V.:2000] Out: 550 [Urine:250]    General: NAD, sitting up in bed Abdomen: soft, nttp, nd GU: deferred Pulm: no respiratory distress Extremities: no clubbing, cyanosis or edema Neuro: A&O x 3  Medications: Current Facility-Administered Medications  Medication Dose Route Frequency Provider Last Rate Last Dose  . acetaminophen (TYLENOL) tablet 650 mg  650 mg Oral Q6H PRN Aletha Halim, MD      . Derrill Memo ON 07/08/2017] amLODipine (NORVASC) tablet 10 mg  10 mg Oral Daily Aletha Halim, MD      . clonazePAM Bobbye [place]) tablet 0.5 mg  0.5 mg Oral BID PRN Aletha Halim, MD      . dicyclomine (BENTYL) capsule 10 mg  10 mg Oral QID PRN Aletha Halim, MD      . DULoxetine (CYMBALTA) DR capsule 60 mg  60 mg Oral BID Aletha Halim, MD      . gabapentin (NEURONTIN) capsule 600 mg  600 mg Oral TID Aletha Halim, MD      . HYDROmorphone (DILAUDID) injection 0.2-0.6 mg  0.2-0.6 mg Intravenous Q2H PRN Aletha Halim, MD   0.5 mg at 07/07/17 1701  . lactated ringers infusion   Intravenous Continuous Aletha Halim, MD      . oxyCODONE (Oxy IR/ROXICODONE) immediate release tablet 5-10 mg  5-10 mg Oral Q6H PRN Aletha Halim, MD      . Derrill Memo  ON 07/08/2017] pantoprazole (PROTONIX) EC tablet 40 mg  40 mg Oral Daily Aletha Halim, MD      . Derrill Memo ON 07/08/2017] polyethylene glycol (MIRALAX / GLYCOLAX) packet 17 g  17 g Oral Daily Aletha Halim, MD      . prochlorperazine (COMPAZINE) injection 10 mg  10 mg Intravenous Q6H PRN Aletha Halim, MD      . simethicone (MYLICON) chewable tablet 80 mg  80 mg Oral TID Aletha Halim, MD        Laboratory: No new labs  Radiology: No new imaging  A/P: pt doing well *GYN: routine care. F/u final path and if pt voids later tonight *FEN/GI: ADAT to regular, MIVF until taking more PO *HTN: continue home meds *PPx: SCDs, OOB ad lib, PPI *Pain: continue with PO PRNs *Dispo: likely tomorrow *Full Code  Durene Romans MD Attending Center for [facility])

## 2017-07-08 ENCOUNTER — Encounter (HOSPITAL_COMMUNITY): Payer: Self-pay | Admitting: Obstetrics and Gynecology

## 2017-07-08 ENCOUNTER — Other Ambulatory Visit: Payer: Self-pay | Admitting: Internal Medicine

## 2017-07-08 DIAGNOSIS — R140 Abdominal distension (gaseous): Secondary | ICD-10-CM

## 2017-07-08 DIAGNOSIS — F411 Generalized anxiety disorder: Secondary | ICD-10-CM

## 2017-07-08 DIAGNOSIS — N938 Other specified abnormal uterine and vaginal bleeding: Secondary | ICD-10-CM

## 2017-07-08 LAB — CBC
HEMATOCRIT: 28.9 % — AB (ref 36.0–46.0)
Hemoglobin: 9 g/dL — ABNORMAL LOW (ref 12.0–15.0)
MCH: 23.1 pg — ABNORMAL LOW (ref 26.0–34.0)
MCHC: 31.1 g/dL (ref 30.0–36.0)
MCV: 74.1 fL — AB (ref 78.0–100.0)
PLATELETS: 366 10*3/uL (ref 150–400)
RBC: 3.9 MIL/uL (ref 3.87–5.11)
RDW: 16.9 % — ABNORMAL HIGH (ref 11.5–15.5)
WBC: 12.5 10*3/uL — ABNORMAL HIGH (ref 4.0–10.5)

## 2017-07-08 MED ORDER — FERROUS GLUCONATE 324 (38 FE) MG PO TABS: 324.0000 mg | ORAL_TABLET | Freq: Every day | ORAL | 0 refills | Status: DC

## 2017-07-08 MED ORDER — OXYCODONE-ACETAMINOPHEN 5-325 MG PO TABS: 1.0000 | ORAL_TABLET | Freq: Four times a day (QID) | ORAL | 0 refills | Status: DC | PRN

## 2017-07-08 MED ORDER — ZOLPIDEM TARTRATE 5 MG PO TABS
5.0000 mg | ORAL_TABLET | Freq: Every evening | ORAL | Status: DC | PRN
  Administered 2017-07-08: 5 mg via ORAL
  Filled 2017-07-08: qty 1

## 2017-07-08 MED ORDER — SIMETHICONE 80 MG PO CHEW: 80.0000 mg | CHEWABLE_TABLET | Freq: Three times a day (TID) | ORAL | 0 refills | Status: DC

## 2017-07-08 MED FILL — ONDANSETRON ODT 8 MG TABLET: 8 | 6 days supply | Qty: 20 | Fill #0

## 2017-07-08 MED FILL — ?DULoxetine HCL 60MG CPEP: 60 | 30 days supply | Qty: 60 | Fill #1

## 2017-07-08 MED FILL — AMLODIPINE BESYLATE 10 MG T: 10 | 30 days supply | Qty: 30 | Fill #3

## 2017-07-08 MED FILL — GABAPENTIN 300 MG CAPSULE: 300 | 30 days supply | Qty: 180 | Fill #1

## 2017-07-08 MED FILL — ?PRAVASTATIN SODIUM 40MG TA: 40 | 30 days supply | Qty: 30 | Fill #8

## 2017-07-08 MED FILL — OXYCODONE-ACETAMINOPHEN 5-3: 5-325 | 3 days supply | Qty: 24 | Fill #0

## 2017-07-08 MED FILL — FLUTICASONE PROP 50 MCG SPR: 50 | 30 days supply | Qty: 16 | Fill #0

## 2017-07-08 MED FILL — CYCLOBENZAPRINE 10 MG TAB: 10 | 20 days supply | Qty: 60 | Fill #1

## 2017-07-08 NOTE — Discharge Instructions (Signed)
Laparoscopic Surgery Discharge Instructions  Instructions Following Major Surgery You have just undergone a major surgery.  The following list should answer your most common questions.  Although we will discuss your surgery and post-operative instructions with you prior to your discharge, this list will serve as a reminder if you fail to recall the details of what we discussed.  We will discuss your surgery once again in detail at your post-op visit in two to four weeks. If you havent already done so, please call to make your appointment as soon as possible.  How you will feel: Although you have just undergone a major surgery, your recovery will be significantly shorter since the surgery was performed through much smaller incisions than the traditional approach.  You should feel slightly better each day.  If you suddenly feel much worse than the prior day, please call the clinic.  Its important during the early part of your recovery that you maintain some activity.  Walking is encouraged.  You will quicken your recovery by continued activity.  Incision:  Your incisions will be closed with dissolvable stitches or surgical adhesive (glue).  There may be Band-aids and/or Steri-strips covering your incisions.  If there is no drainage from the incisions you may remove the Band-aids in one to two days.  You may notice some minor bruising at the incision sites.  This is common and will resolve within several days.  Please inform us if the redness at the edges of your incision appears to be spreading.  If the skin around your incision becomes warm to the touch, or if you notice a pus-like drainage, please call the office.  Vaginal Discharge Following a Laparoscopic Hysterectomy: Minor vaginal bleeding or spotting is normal following a hysterectomy.  Bleeding similar to the amount of your period is excessive, and you should inform us of this immediately.  Vaginal spotting may continue for several weeks following  your surgery.  You may notice a yellowish discharge which occasionally occurs as the vaginal stitches dissolve, and may last for several weeks.  Sexual Activity Following a Hysterectomy: Do not have sexual intercourse or place tampons or douches in the vagina prior to your first office visit.  We will discuss when you may resume these activities at that visit.    Stairs/Driving/Activities: You may climb stairs if necessary.  If youve had general anesthesia, do not drive a car the rest of the day today.  You may begin light housework when you feel up to it, but avoid heavy lifting (more than 15-20lbs) or pushing until cleared for these activities by your physician.  Hygiene:  Do not soak your incisions.  Showers are acceptable but you may not take a bath or swim in a pool.  Cleanse your incisions daily with soap and water.  Medications:  Please resume taking any medications that you were taking prior to the surgery.  If we have prescribed any new medications for you, please take them as directed.  Constipation:  It is fairly common to experience some difficulty in moving your bowels following major surgery.  Being active will help to reduce this likelihood. A diet rich in fiber and plenty of liquids is desirable.  If you do become constipated, a mild laxative such as Miralax, Milk of Magnesia, or Metamucil, or a stool softener such as Colace, is recommended.  General Instructions: If you develop a fever of 100.5 degrees or higher, please call the office number(s) below for physician on call.

## 2017-07-08 NOTE — Discharge Summary (Signed)
Gynecology Discharge Summary Date of Admission: 07/07/2017 Date of Discharge: 07/08/2017  The patient was admitted, as scheduled, and underwent a TVH/cystoscopy (EBL 373mL); please refer to operative note for full details.  She was meeting all post op goals and discharged to home on POD#0, including ambulation, taking PO w/o difficulty, flatus (no BMs), pain controlled with PO meds, +minimal vaginal spotting/bleeding and no nausea, vomiting, fevers, chills, sob, abdominal pain. She was also AF VS normal and stable and CV, pulm and abdominal exam were bening.   Allergies as of 07/08/2017   No Known Allergies     Medication List    TAKE these medications   acetaminophen-codeine 300-30 MG tablet Commonly known as:  TYLENOL #3 Take 1 tablet by mouth every 12 (twelve) hours as needed for moderate pain.   amLODipine 10 MG tablet Commonly known as:  NORVASC Take 1 tablet (10 mg total) by mouth daily.   clonazePAM 0.5 MG tablet Commonly known as:  KLONOPIN Take 1 tablet (0.5 mg total) by mouth at bedtime as needed. for anxiety   cyclobenzaprine 10 MG tablet Commonly known as:  FLEXERIL Take 1 tablet (10 mg total) by mouth 3 (three) times daily as needed. for muscle spams   diclofenac sodium 1 % Gel Commonly known as:  VOLTAREN Apply 4 g topically 4 (four) times daily. What changed:    when to take this  reasons to take this   dicyclomine 10 MG capsule Commonly known as:  BENTYL Take 10 mg by mouth 4 (four) times daily as needed for spasms.   diphenhydrAMINE 25 MG tablet Commonly known as:  BENADRYL Take 25 mg by mouth daily as needed for allergies.   DULoxetine 60 MG capsule Commonly known as:  CYMBALTA Take 1 capsule (60 mg total) by mouth 2 (two) times daily.   ferrous gluconate 324 MG tablet Commonly known as:  FERGON Take 1 tablet (324 mg total) by mouth daily with breakfast.   fluticasone 50 MCG/ACT nasal spray Commonly known as:  FLONASE Place 2 sprays into both  nostrils daily. What changed:    when to take this  reasons to take this   gabapentin 300 MG capsule Commonly known as:  NEURONTIN Take 2 capsules (600 mg total) by mouth 3 (three) times daily.   megestrol 40 MG tablet Commonly known as:  MEGACE 1 tab po tid for first two days of period and then 1 tab po daily until period stops   omeprazole 20 MG capsule Commonly known as:  PRILOSEC Take 2 capsules (40 mg total) by mouth daily.   ondansetron 8 MG disintegrating tablet Commonly known as:  ZOFRAN-ODT Take 1 tablet (8 mg total) by mouth every 8 (eight) hours as needed for nausea or vomiting.   oxyCODONE-acetaminophen 5-325 MG tablet Commonly known as:  PERCOCET/ROXICET Take 1-2 tablets by mouth every 6 (six) hours as needed.   pravastatin 40 MG tablet Commonly known as:  PRAVACHOL Take 1 tablet (40 mg total) by mouth daily.   simethicone 80 MG chewable tablet Commonly known as:  MYLICON Chew 1 tablet (80 mg total) by mouth 3 (three) times daily.       Inbasket message for 1 month f/u sent to clinic.   Durene Romans MD Attending Center for [facility])

## 2017-07-08 NOTE — Anesthesia Postprocedure Evaluation (Signed)
Anesthesia Post Note  Patient: Isabel Evans  Procedure(s) Performed: HYSTERECTOMY VAGINAL (N/A Vagina ) CYSTOSCOPY (N/A Bladder)     Patient location during evaluation: [facility] Anesthesia Type: General Level of consciousness: awake Pain management: pain level controlled Vital Signs Assessment: post-procedure vital signs reviewed and stable Respiratory status: spontaneous breathing Cardiovascular status: stable Postop Assessment: no apparent nausea or vomiting and adequate PO intake Anesthetic complications: no    Last Vitals:  Vitals:   07/08/17 0415 07/08/17 0800  BP: 121/78 111/68  Pulse: 91 92  Resp: 18 18  Temp: 36.7 C 36.6 C  SpO2: 98% 97%    Last Pain:  Vitals:   07/08/17 0800  TempSrc: Oral  PainSc:    Pain Goal: Patients Stated Pain Goal: 2 (07/07/17 1945)               Everette Rank

## 2017-07-08 NOTE — Addendum Note (Signed)
Addendum  created 07/08/17 0839 by Georgeanne Nim, CRNA   Sign clinical note

## 2017-07-08 NOTE — Plan of Care (Signed)
Pt. Will continue to improve

## 2017-07-08 NOTE — Care Management (Signed)
CM received call about seeing patient b/c she is self pay.  CM went to see patient in room.  Patient is an [facility] and gets her medicines there.  She said her medicines price range from 4-10$ per med there and that she has a " blue card" something that gives her a discount with medicines.  Patient medicines are simethicone, iron and percocet.  Patient plans to get her simethicone and iron at the [facility] and her Percocet she plans to obtain at [facility] and she stated she will be able to afford them.  Patient is not eligible for MATCH because these meds are over over the counter and narcotics. No needs at this time.

## 2017-07-12 ENCOUNTER — Other Ambulatory Visit: Payer: Self-pay | Admitting: Internal Medicine

## 2017-07-12 ENCOUNTER — Other Ambulatory Visit: Payer: Self-pay | Admitting: Family Medicine

## 2017-07-12 DIAGNOSIS — R140 Abdominal distension (gaseous): Secondary | ICD-10-CM

## 2017-07-12 DIAGNOSIS — F4323 Adjustment disorder with mixed anxiety and depressed mood: Secondary | ICD-10-CM

## 2017-07-12 MED FILL — ?OMEPRAZOLE DR 20MG CAPSULE: 20 | 30 days supply | Qty: 60 | Fill #4

## 2017-07-13 ENCOUNTER — Encounter (HOSPITAL_COMMUNITY): Payer: Self-pay

## 2017-07-14 ENCOUNTER — Ambulatory Visit: Payer: Self-pay

## 2017-07-15 ENCOUNTER — Other Ambulatory Visit: Payer: Self-pay | Admitting: Family Medicine

## 2017-07-15 ENCOUNTER — Other Ambulatory Visit: Payer: Self-pay | Admitting: Obstetrics and Gynecology

## 2017-07-15 DIAGNOSIS — F4323 Adjustment disorder with mixed anxiety and depressed mood: Secondary | ICD-10-CM

## 2017-07-18 NOTE — Telephone Encounter (Signed)
Refill request, in between establishing care appointment.

## 2017-07-21 ENCOUNTER — Ambulatory Visit: Payer: Self-pay | Attending: Internal Medicine | Admitting: Physician Assistant

## 2017-07-21 ENCOUNTER — Ambulatory Visit: Payer: Self-pay | Attending: Family Medicine | Admitting: Licensed Clinical Social Worker

## 2017-07-21 VITALS — BP 117/84 | HR 93 | Temp 98.9°F | Resp 16 | Ht 69.0 in | Wt 200.6 lb

## 2017-07-21 DIAGNOSIS — N189 Chronic kidney disease, unspecified: Secondary | ICD-10-CM | POA: Insufficient documentation

## 2017-07-21 DIAGNOSIS — K589 Irritable bowel syndrome without diarrhea: Secondary | ICD-10-CM | POA: Insufficient documentation

## 2017-07-21 DIAGNOSIS — Z90710 Acquired absence of both cervix and uterus: Secondary | ICD-10-CM | POA: Insufficient documentation

## 2017-07-21 DIAGNOSIS — F331 Major depressive disorder, recurrent, moderate: Secondary | ICD-10-CM

## 2017-07-21 DIAGNOSIS — M545 Low back pain: Secondary | ICD-10-CM | POA: Insufficient documentation

## 2017-07-21 DIAGNOSIS — M1990 Unspecified osteoarthritis, unspecified site: Secondary | ICD-10-CM | POA: Insufficient documentation

## 2017-07-21 DIAGNOSIS — G8929 Other chronic pain: Secondary | ICD-10-CM | POA: Insufficient documentation

## 2017-07-21 DIAGNOSIS — K219 Gastro-esophageal reflux disease without esophagitis: Secondary | ICD-10-CM | POA: Insufficient documentation

## 2017-07-21 DIAGNOSIS — M797 Fibromyalgia: Secondary | ICD-10-CM | POA: Insufficient documentation

## 2017-07-21 DIAGNOSIS — Z79899 Other long term (current) drug therapy: Secondary | ICD-10-CM | POA: Insufficient documentation

## 2017-07-21 DIAGNOSIS — F419 Anxiety disorder, unspecified: Secondary | ICD-10-CM

## 2017-07-21 DIAGNOSIS — Z8719 Personal history of other diseases of the digestive system: Secondary | ICD-10-CM | POA: Insufficient documentation

## 2017-07-21 DIAGNOSIS — Z79891 Long term (current) use of opiate analgesic: Secondary | ICD-10-CM | POA: Insufficient documentation

## 2017-07-21 DIAGNOSIS — I129 Hypertensive chronic kidney disease with stage 1 through stage 4 chronic kidney disease, or unspecified chronic kidney disease: Secondary | ICD-10-CM | POA: Insufficient documentation

## 2017-07-21 DIAGNOSIS — I10 Essential (primary) hypertension: Secondary | ICD-10-CM

## 2017-07-21 DIAGNOSIS — I739 Peripheral vascular disease, unspecified: Secondary | ICD-10-CM | POA: Insufficient documentation

## 2017-07-21 DIAGNOSIS — F4323 Adjustment disorder with mixed anxiety and depressed mood: Secondary | ICD-10-CM | POA: Insufficient documentation

## 2017-07-21 DIAGNOSIS — D500 Iron deficiency anemia secondary to blood loss (chronic): Secondary | ICD-10-CM | POA: Insufficient documentation

## 2017-07-21 MED ORDER — DICYCLOMINE HCL 10 MG PO CAPS: 10.0000 mg | ORAL_CAPSULE | Freq: Four times a day (QID) | ORAL | 0 refills | Status: DC | PRN

## 2017-07-21 NOTE — BH Specialist Note (Signed)
Integrated Behavioral Health Initial Visit  MRN: [id number] Name: Isabel Evans  Number of [facility] Clinician visits:: 1/6 Session Start time: 2:15 PM  Session End time: 2:50 PM Total time: 35 minutes  Type of Service: [place] Interpretor:No. Interpretor Name and Language: N/A   Warm Hand Off Completed.       SUBJECTIVE: Isabel Evans is a 47 y.o. female accompanied by self Patient was referred by Isabel Evans for depression and anxiety. Patient reports the following symptoms/concerns: feelings of sadness and worry, crying spells, difficulty of sleeping, low energy, decreased concentration, and irritability Duration of problem: Ongoing; Severity of problem: severe  OBJECTIVE: Mood: Anxious and Depressed and Affect: Appropriate Risk of harm to self or others: No plan to harm self or others  LIFE CONTEXT: Family and Social: Pt receives support from [place], mother, and adult children (2 sons and one daughter) School/Work: Pt is employed part time and receives food stamps [phone]) She is currently on medical leave Self-Care: Pt enjoys spending time with grandchildren. Denied use of substances Life Changes: Pt has ongoing medical concerns and is currently healing from a recent surgery. Pt is concerned about financial strain while on medical leave and has been experiencing stress due to mother's declining health  GOALS ADDRESSED: Patient will: 1. Reduce symptoms of: anxiety and depression 2. Increase knowledge and/or ability of: coping skills  3. Demonstrate ability to: Increase healthy adjustment to current life circumstances and Increase adequate support systems for patient/family  INTERVENTIONS: Interventions utilized: Mindfulness or Psychologist, educational, Supportive Counseling, Psychoeducation and/or Health Education and Link to [employer]  Standardized Assessments completed: GAD-7 and PHQ 2&9 with  C-SSRS  ASSESSMENT: Patient currently experiencing depression and anxiety triggered by ongoing medical concerns, stress regarding mother's declined health, and financial strain. She reports feelings of sadness and worry, crying spells, difficulty of sleeping, low energy, decreased concentration, and irritability. She receives support from family and friends.    Patient may benefit from psychoeducation and psychotherapy. [place] educated pt on correlation between one's physical and mental health, in addition, to how stress can negatively impact health. Therapeutic interventions were discussed to promote positive feelings and manage stress. Pt identified healthy strategies to implement on a routine basis and is participating in medication management through PCP. Community resources for food insecurity, psychotherapy, grief support, and utility assistance was provided.   PLAN: 1. Follow up with behavioral health clinician on : Pt was encouraged tocontact [facility]CSWA if symptoms worsen or fail to improveto schedule behavioral appointments at [facility]. 2. Behavioral recommendations: LCSWA recommends that pt apply healthy coping skills discussed, comply with medication management, and utilize provided resources. Pt is encouraged to schedule follow up appointment with LCSWA 3. Referral(s): [place] (In Clinic), [place] (LME/Outside Clinic) and Community Resources:  Food and Finances  4. "From scale of 1-10, how likely are you to follow plan?":   Isabel Chesterfield, LCSW 07/22/17 5:10 PM

## 2017-07-21 NOTE — Progress Notes (Signed)
Patient ID: Isabel Evans, female   DOB: April 13, 1971, 47 y.o.   MRN: [id number]   Roschelle Calandra, is a 47 y.o. female  OVZ:[id number]  [id number]:[id number]  DOB - 1970/11/23  Subjective:  Chief Complaint and HPI: Isabel Evans is a 47 y.o. female here today requesting pain meds and Clonazepam.  According to the chart,  Prescriptions for  #60 tylenol #3 and #20 clonazepam were printed out.  She was also prescribed #24 oxycodone s/p vaginal hysterectomy on 07/08/2017.  She has chronic back pain, fibromyalgia, and Dupytrens contracture.    She also needs to get her Hgb rechecked since her surgery.  Doing well overall since surgery. Has f/up surgically in a couple of weeks.  No f/c.  Bowels moving normally.     ROS:   Constitutional:  No f/c, No night sweats, No unexplained weight loss. EENT:  No vision changes, No blurry vision, No hearing changes. No mouth, throat, or ear problems.  Respiratory: No cough, No SOB Cardiac: No CP, no palpitations GI:  No abd pain, No N/V/D. GU: No Urinary s/sx Musculoskeletal: chronic pain Neuro: No headache, no dizziness, no motor weakness.  Skin: No rash Endocrine:  No polydipsia. No polyuria.  Psych: Denies SI/HI  No problems updated.  ALLERGIES: No Known Allergies  PAST MEDICAL HISTORY: Past Medical History:  Diagnosis Date  . Anxiety   . Bone spur    cervical spine  . Chronic kidney disease 04/2017   kidney infections  . Depression   . Deviated nasal septum    states is unable to lie flat, because her nose will become congested and she can stop breathing  . Dysrhythmia    heart flutters occasionally  . Fibromyalgia   . GERD (gastroesophageal reflux disease)   . Hallux abductovalgus with bunions 09/2014   right   . Hammertoe 09/2014   right 4th, 5th  . History of stomach ulcers   . Hypertension    states is under control with med., has been on med. x 8 mos.  . IBS (irritable bowel syndrome)    no current med.  . Osteoarthritis    hands,  bilateral hips  . Peripheral vascular disease (HCC)    occasional swelling in both legs  . Sinus headache     MEDICATIONS AT HOME: Prior to Admission medications   Medication Sig Start Date End Date Taking? Authorizing Provider  amLODipine (NORVASC) 10 MG tablet Take 1 tablet (10 mg total) by mouth daily. 05/18/17  Yes Tresa Garter, MD  cyclobenzaprine (FLEXERIL) 10 MG tablet Take 1 tablet (10 mg total) by mouth 3 (three) times daily as needed. for muscle spams 05/18/17  Yes Jegede, Olugbemiga E, MD  diclofenac sodium (VOLTAREN) 1 % GEL Apply 4 g topically 4 (four) times daily. Patient taking differently: Apply 4 g topically daily as needed (pain).  02/09/17  Yes Tresa Garter, MD  dicyclomine (BENTYL) 10 MG capsule Take 10 mg by mouth 4 (four) times daily as needed for spasms.   Yes [provider]  diphenhydrAMINE (BENADRYL) 25 MG tablet Take 25 mg by mouth daily as needed for allergies.   Yes [provider]  DULoxetine (CYMBALTA) 60 MG capsule Take 1 capsule (60 mg total) by mouth 2 (two) times daily. 05/18/17  Yes Tresa Garter, MD  ferrous gluconate (FERGON) 324 MG tablet Take 1 tablet (324 mg total) by mouth daily with breakfast. 07/08/17  Yes Aletha Halim, MD  fluticasone (FLONASE) 50 MCG/ACT nasal spray PLACE 2  SPRAYS INTO BOTH NOSTRILS DAILY. 07/08/17  Yes Tresa Garter, MD  gabapentin (NEURONTIN) 300 MG capsule Take 2 capsules (600 mg total) by mouth 3 (three) times daily. 05/18/17  Yes Tresa Garter, MD  omeprazole (PRILOSEC) 20 MG capsule Take 2 capsules (40 mg total) by mouth daily. 09/22/16  Yes Jegede, Olugbemiga E, MD  ondansetron (ZOFRAN-ODT) 8 MG disintegrating tablet TAKE 1 TABLET BY MOUTH EVERY 8 HOURS AS NEEDED FOR NAUSEA OR VOMITING. 07/08/17  Yes Jegede, Olugbemiga E, MD  pravastatin (PRAVACHOL) 40 MG tablet Take 1 tablet (40 mg total) by mouth daily. 05/18/17  Yes Tresa Garter, MD  simethicone (MYLICON) 80 MG  chewable tablet Chew 1 tablet (80 mg total) by mouth 3 (three) times daily. 07/08/17  Yes Aletha Halim, MD  acetaminophen-codeine (TYLENOL #3) 300-30 MG tablet Take 1 tablet by mouth every 12 (twelve) hours as needed for moderate pain. Patient not taking: Reported on 07/21/2017 06/29/17   Charlott Rakes, MD  clonazePAM (KLONOPIN) 0.5 MG tablet Take 1 tablet (0.5 mg total) by mouth at bedtime as needed. for anxiety Patient not taking: Reported on 07/21/2017 06/29/17   Charlott Rakes, MD  oxyCODONE-acetaminophen (PERCOCET/ROXICET) 5-325 MG tablet Take 1-2 tablets by mouth every 6 (six) hours as needed. Patient not taking: Reported on 07/21/2017 07/08/17   Aletha Halim, MD     Objective:  EXAM:   Vitals:   07/21/17 1342  BP: 117/84  Pulse: 93  Resp: 16  Temp: 98.9 F (37.2 C)  TempSrc: Oral  SpO2: 98%  Weight: 200 lb 9.6 oz (91 kg)  Height: 5\' 9"  (1.753 m)    General appearance : A&OX3. NAD. Non-toxic-appearing HEENT: Atraumatic and Normocephalic.  PERRLA. EOM intact.   Neck: supple, no JVD. No cervical lymphadenopathy. No thyromegaly Chest/Lungs:  Breathing-non-labored, Good air entry bilaterally, breath sounds normal without rales, rhonchi, or wheezing  CVS: S1 S2 regular, no murmurs, gallops, rubs  Extremities: Bilateral Lower Ext shows no edema, both legs are warm to touch with = pulse throughout Neurology:  CN II-XII grossly intact, Non focal.   Psych:  TP linear. J/I WNL. Normal speech. Appropriate eye contact and affect.  Skin:  No Rash  Data Review Lab Results  Component Value Date   HGBA1C 5.7 11/27/2015     Assessment & Plan   1. Hypertension, unspecified type Controlled-continue current regimen  2. Status post vaginal hysterectomy Doing well-keep f/up with surgeon - CBC with Differential/Platelet  3. Adjustment disorder with mixed anxiety and depressed mood F/up with [facility].  4. Iron deficiency anemia due to chronic blood loss - CBC with  Differential/Platelet  5. Fibromyalgia - Ambulatory referral to Pain Clinic  6. Chronic low back pain, unspecified back pain laterality, with sciatica presence unspecified - Ambulatory referral to Pain Clinic  I do not feel comfortable RF Tylenol #3 or cloanzepam today based on the above information.     Patient have been counseled extensively about nutrition and exercise  Return in about 2 weeks (around 08/04/2017) for assign PCP.  The patient was given clear instructions to go to ER or return to medical center if symptoms don't improve, worsen or new problems develop. The patient verbalized understanding. The patient was told to call to get lab results if they haven't heard anything in the next week.     Freeman Caldron, PA-C [facility] [place], [place]   07/21/2017, 1:58 PM

## 2017-07-21 NOTE — Progress Notes (Signed)
Patient is here for blood work done for her iron levels.  Pt. Stated she would like to see if she can have medication refills.

## 2017-07-22 LAB — CBC WITH DIFFERENTIAL/PLATELET
BASOS ABS: 0 10*3/uL (ref 0.0–0.2)
BASOS: 1 %
EOS (ABSOLUTE): 0.4 10*3/uL (ref 0.0–0.4)
Eos: 4 %
Hematocrit: 35 % (ref 34.0–46.6)
Hemoglobin: 10.9 g/dL — ABNORMAL LOW (ref 11.1–15.9)
IMMATURE GRANULOCYTES: 1 %
Immature Grans (Abs): 0 10*3/uL (ref 0.0–0.1)
Lymphocytes Absolute: 2.7 10*3/uL (ref 0.7–3.1)
Lymphs: 31 %
MCH: 23.2 pg — AB (ref 26.6–33.0)
MCHC: 31.1 g/dL — ABNORMAL LOW (ref 31.5–35.7)
MCV: 75 fL — AB (ref 79–97)
Monocytes Absolute: 0.7 10*3/uL (ref 0.1–0.9)
Monocytes: 9 %
NEUTROS PCT: 54 %
Neutrophils Absolute: 4.8 10*3/uL (ref 1.4–7.0)
PLATELETS: 441 10*3/uL — AB (ref 150–379)
RBC: 4.7 x10E6/uL (ref 3.77–5.28)
RDW: 19.8 % — AB (ref 12.3–15.4)
WBC: 8.6 10*3/uL (ref 3.4–10.8)

## 2017-07-25 ENCOUNTER — Telehealth: Payer: Self-pay | Admitting: *Deleted

## 2017-07-25 NOTE — Telephone Encounter (Signed)
-----   Message from [place] Donovan, [place] sent at 07/23/2017  4:10 PM EDT ----- Your hemoglobin is almost back to normal!  Eat foods rich in iron and drink plenty of water. Follow-up as planned. Thanks, Freeman Caldron, PA-C

## 2017-07-25 NOTE — Telephone Encounter (Signed)
Medical Assistant left message on patient's home and cell voicemail. Voicemail states to give a call back to [place] with [facility] at [phone]. Patient is aware of hemoglobin being almost back to normal, continue with iron rich foods and water and follow up as planned.

## 2017-07-30 ENCOUNTER — Other Ambulatory Visit: Payer: Self-pay | Admitting: Physician Assistant

## 2017-07-30 ENCOUNTER — Other Ambulatory Visit: Payer: Self-pay | Admitting: Family Medicine

## 2017-07-30 DIAGNOSIS — F4323 Adjustment disorder with mixed anxiety and depressed mood: Secondary | ICD-10-CM

## 2017-08-08 ENCOUNTER — Encounter: Payer: Self-pay | Admitting: Obstetrics and Gynecology

## 2017-08-08 ENCOUNTER — Ambulatory Visit (INDEPENDENT_AMBULATORY_CARE_PROVIDER_SITE_OTHER): Payer: Self-pay | Admitting: Obstetrics and Gynecology

## 2017-08-08 ENCOUNTER — Encounter: Payer: Self-pay | Admitting: General Practice

## 2017-08-08 ENCOUNTER — Other Ambulatory Visit: Payer: Self-pay | Admitting: Internal Medicine

## 2017-08-08 VITALS — BP 119/94 | HR 97 | Ht 69.0 in | Wt 197.9 lb

## 2017-08-08 DIAGNOSIS — Z09 Encounter for follow-up examination after completed treatment for conditions other than malignant neoplasm: Secondary | ICD-10-CM

## 2017-08-08 DIAGNOSIS — F4323 Adjustment disorder with mixed anxiety and depressed mood: Secondary | ICD-10-CM

## 2017-08-08 MED FILL — PRAVASTATIN NA 40 MG TAB: 40 | 30 days supply | Qty: 30 | Fill #9

## 2017-08-08 MED FILL — AMLODIPINE BESYLATE 10 MG T: 10 | 30 days supply | Qty: 30 | Fill #4

## 2017-08-08 MED FILL — DICYCLOMINE 10 MG CAPSULE: 10 | 7 days supply | Qty: 30 | Fill #0

## 2017-08-08 MED FILL — GABAPENTIN 300 MG CAPSULE: 300 | 30 days supply | Qty: 180 | Fill #2

## 2017-08-08 MED FILL — ?OMEPRAZOLE DR 20MG CAPSULE: 20 | 30 days supply | Qty: 60 | Fill #5

## 2017-08-08 MED FILL — ?DULoxetine HCL 60MG CPEP: 60 | 30 days supply | Qty: 60 | Fill #2

## 2017-08-08 NOTE — Progress Notes (Signed)
Obstetrics and Gynecology Visit Return Patient Evaluation  Appointment Date: 08/08/2017  Primary Care Provider: Tresa Garter  Referring Provider: Tresa Garter, MD  Chief Complaint: regular post op check  History of Present Illness:  Isabel Evans is a 47 y.o. s/p  3/21 TVH/cysto for AUB. Pt discharged to home on pod#1. Pt doing well and no complaints. Nothing per vagina and no VB or spotting. Final path was negative.  Review of Systems:  as noted in the History of Present Illness.   Medications and Allergies: reviewed Physical Exam:  BP (!) 119/94   Pulse 97   Ht 5\' 9"  (1.753 m)   Wt 197 lb 14.4 oz (89.8 kg)   LMP 07/04/2017   BMI 29.22 kg/m  Body mass index is 29.22 kg/m. General appearance: Well nourished, well developed female in no acute distress.  Abdomen: diffusely non tender to palpation, non distended, and no masses, hernias Neuro/Psych:  Normal mood and affect.    Pelvic exam:  EGBUS normal. Vaginal vault normal. Cuff normal, nttp.    Assessment:  pt doing well  Plan:  1. Postop check Released off precautions except recommend no sex for another two weeks to give six weeks of full healing Had slightly abnormal pap smear a few years ago. I told her to still continue pap smears.   RTC: PRN  Durene Romans MD Attending Center for [employer] [facility])

## 2017-08-11 ENCOUNTER — Ambulatory Visit: Payer: Self-pay | Admitting: Obstetrics and Gynecology

## 2017-08-12 MED FILL — ONDANSETRON ODT 8 MG TABLET: 8 | 6 days supply | Qty: 20 | Fill #0

## 2017-08-12 MED FILL — CYCLOBENZAPRINE 10 MG TAB: 10 | 20 days supply | Qty: 60 | Fill #0

## 2017-08-24 ENCOUNTER — Ambulatory Visit (INDEPENDENT_AMBULATORY_CARE_PROVIDER_SITE_OTHER): Payer: Self-pay | Admitting: Nurse Practitioner

## 2017-08-24 ENCOUNTER — Ambulatory Visit: Payer: Self-pay | Admitting: Nurse Practitioner

## 2017-09-06 ENCOUNTER — Telehealth: Payer: Self-pay | Admitting: Licensed Clinical Social Worker

## 2017-09-06 NOTE — Telephone Encounter (Signed)
LCSWA received message from pt requesting return call. Pt shared that adult daughter is experiencing an increase in depression symptoms triggered by ongoing medical conditions, stress from school, and grief. Denied current SI/HI. Pt requested resources to assist.   LCSWA provided community resources for crisis intervention, psychotherapy, and medication management. Coping skills were discussed and pt was strongly encouraged to contact [facility] with any additional questions or resource needs.   Pt stated that she has observed a decrease in her anxiety and depression. She returned to work last week and has been doing well. No additional concerns were reported.

## 2017-09-15 ENCOUNTER — Other Ambulatory Visit: Payer: Self-pay | Admitting: Nurse Practitioner

## 2017-09-15 ENCOUNTER — Other Ambulatory Visit: Payer: Self-pay | Admitting: Physician Assistant

## 2017-09-15 DIAGNOSIS — F4323 Adjustment disorder with mixed anxiety and depressed mood: Secondary | ICD-10-CM

## 2017-09-15 MED FILL — ?OMEPRAZOLE DR 20MG CAPSULE: 20 | 30 days supply | Qty: 60 | Fill #6

## 2017-09-15 MED FILL — AMLODIPINE BESYLATE 10 MG T: 10 | 30 days supply | Qty: 30 | Fill #5

## 2017-09-15 MED FILL — ?DULoxetine HCL 6OMG CP: 60 | 30 days supply | Qty: 60 | Fill #3

## 2017-09-19 MED FILL — DICYCLOMINE 10 MG CAPSULE: 10 | 7 days supply | Qty: 30 | Fill #0

## 2017-09-19 MED FILL — CYCLOBENZAPRINE 10 MG TAB: 10 | 5 days supply | Qty: 15 | Fill #0

## 2017-09-22 ENCOUNTER — Ambulatory Visit: Payer: Self-pay | Attending: Internal Medicine | Admitting: Internal Medicine

## 2017-09-22 ENCOUNTER — Encounter: Payer: Self-pay | Admitting: Internal Medicine

## 2017-09-22 VITALS — BP 145/89 | HR 88 | Temp 98.0°F | Resp 16 | Ht 69.5 in | Wt 195.0 lb

## 2017-09-22 DIAGNOSIS — Z79899 Other long term (current) drug therapy: Secondary | ICD-10-CM | POA: Insufficient documentation

## 2017-09-22 DIAGNOSIS — F419 Anxiety disorder, unspecified: Secondary | ICD-10-CM | POA: Insufficient documentation

## 2017-09-22 DIAGNOSIS — Z808 Family history of malignant neoplasm of other organs or systems: Secondary | ICD-10-CM | POA: Insufficient documentation

## 2017-09-22 DIAGNOSIS — K219 Gastro-esophageal reflux disease without esophagitis: Secondary | ICD-10-CM | POA: Insufficient documentation

## 2017-09-22 DIAGNOSIS — Z8249 Family history of ischemic heart disease and other diseases of the circulatory system: Secondary | ICD-10-CM | POA: Insufficient documentation

## 2017-09-22 DIAGNOSIS — F329 Major depressive disorder, single episode, unspecified: Secondary | ICD-10-CM | POA: Insufficient documentation

## 2017-09-22 DIAGNOSIS — M797 Fibromyalgia: Secondary | ICD-10-CM | POA: Insufficient documentation

## 2017-09-22 DIAGNOSIS — I10 Essential (primary) hypertension: Secondary | ICD-10-CM | POA: Insufficient documentation

## 2017-09-22 DIAGNOSIS — Z8041 Family history of malignant neoplasm of ovary: Secondary | ICD-10-CM | POA: Insufficient documentation

## 2017-09-22 DIAGNOSIS — Z87891 Personal history of nicotine dependence: Secondary | ICD-10-CM | POA: Insufficient documentation

## 2017-09-22 DIAGNOSIS — Z833 Family history of diabetes mellitus: Secondary | ICD-10-CM | POA: Insufficient documentation

## 2017-09-22 DIAGNOSIS — Z90710 Acquired absence of both cervix and uterus: Secondary | ICD-10-CM | POA: Insufficient documentation

## 2017-09-22 DIAGNOSIS — R51 Headache: Secondary | ICD-10-CM

## 2017-09-22 DIAGNOSIS — M150 Primary generalized (osteo)arthritis: Secondary | ICD-10-CM

## 2017-09-22 DIAGNOSIS — M159 Polyosteoarthritis, unspecified: Secondary | ICD-10-CM

## 2017-09-22 DIAGNOSIS — M160 Bilateral primary osteoarthritis of hip: Secondary | ICD-10-CM | POA: Insufficient documentation

## 2017-09-22 DIAGNOSIS — Z9851 Tubal ligation status: Secondary | ICD-10-CM | POA: Insufficient documentation

## 2017-09-22 DIAGNOSIS — M720 Palmar fascial fibromatosis [Dupuytren]: Secondary | ICD-10-CM | POA: Insufficient documentation

## 2017-09-22 DIAGNOSIS — G8929 Other chronic pain: Secondary | ICD-10-CM | POA: Insufficient documentation

## 2017-09-22 DIAGNOSIS — Z803 Family history of malignant neoplasm of breast: Secondary | ICD-10-CM | POA: Insufficient documentation

## 2017-09-22 DIAGNOSIS — R519 Headache, unspecified: Secondary | ICD-10-CM

## 2017-09-22 DIAGNOSIS — Z9889 Other specified postprocedural states: Secondary | ICD-10-CM | POA: Insufficient documentation

## 2017-09-22 DIAGNOSIS — K029 Dental caries, unspecified: Secondary | ICD-10-CM | POA: Insufficient documentation

## 2017-09-22 MED ORDER — ONDANSETRON HCL 4 MG PO TABS: 4.0000 mg | ORAL_TABLET | Freq: Two times a day (BID) | ORAL | 0 refills | Status: DC | PRN

## 2017-09-22 MED ORDER — MELOXICAM 15 MG PO TABS: 15.0000 mg | ORAL_TABLET | Freq: Every day | ORAL | 2 refills | Status: DC

## 2017-09-22 MED ORDER — HYDROXYZINE HCL 25 MG PO TABS: 25.0000 mg | ORAL_TABLET | Freq: Two times a day (BID) | ORAL | 2 refills | Status: DC | PRN

## 2017-09-22 MED FILL — hydrOXYzine HCL 25 MG TABS: 25 | 15 days supply | Qty: 30 | Fill #0

## 2017-09-22 MED FILL — MELOXICAM 15 MG TABLET: 15 | 30 days supply | Qty: 30 | Fill #0

## 2017-09-22 MED FILL — ONDANSETRON HCL 4 MG TABLET: 4 | 10 days supply | Qty: 20 | Fill #0

## 2017-09-22 NOTE — Patient Instructions (Signed)
Stop taking Goody's Powder. Start Meloxicam for the arthritis. Start Hydroxyzine as needed for anxiety.

## 2017-09-22 NOTE — Progress Notes (Signed)
Patient ID: Isabel Evans, female    DOB: [birth year]  MRN: [id number]  CC: re-estabish; Hypertension; Referral; and Medication Refill   Subjective: Isabel Evans is a 47 y.o. female who presents for chronic ds management.  Previous PCP was Dr. Doreene Burke Her concerns today include:  IBS, fibromyalgia, major dep/anxiety, HTN, GERD, HL, OA hips, chronic LBP due to DJD.   Patient has contractures of both hands.  Had seen hand surgeon at [facility] about 5 yrs ago and had surgery. Helped for about 6 mths.  Contractures are getting worse again and limiting use of hands.  She is requesting referral back to a hand surgeon.  She has seen [facility] last year and was told to see a Copy. Has [facility] discount  She was requesting referral to see a dentist.  She has 3 teeth broken off.  Arthritis in hips and neck: Complains of pain in both hips which she feels is due to arthritis.  When asked to describe where she feels the pain she actually points to the buttocks.  No pain in the groin area.  Pain is worse with prolonged standing and walking which she does a lot of at work.  She works in [employer].  Also complains of a lot of muscle spasms especially in the neck.  Requesting refill on Flexeril to use as needed.  Requesting refill on Tylenol 3's.  She has never been tried with a Cox 2 inhibitor.  Anxiety and depression: She is on Cymbalta.  She is requesting refill on clonazepam.  States she is under a lot of stress at work and home.  She does not feel that the Cymbalta alone is sufficiently controlling her anxiety.  She has not seen a psychiatrist in the past.  HTN:  Did not take as yet for today.  She limits salt in the food.  Complains of headache at the back of the head more so on the right side.  Headaches have been almost daily for the past several months.  Denies any associated photophobia.  Some nausea.  Headaches come on suddenly and can last hours.  She takes [place] powder  almost daily. Patient Active Problem List   Diagnosis Date Noted  . Blurry vision, bilateral 05/18/2017  . Callus of foot 05/18/2017  . Pain in joint of left hip 02/16/2017  . Bruises easily 11/10/2016  . Gastroesophageal reflux disease without esophagitis 09/22/2016  . Fatigue associated with anemia 09/22/2016  . Flexion contractures 09/22/2016  . Generalized anxiety disorder 01/01/2016  . Dental abscess 01/01/2016  . Healthcare maintenance 05/08/2015  . Adjustment disorder with mixed anxiety and depressed mood 05/08/2015  . Stress at home 10/24/2014  . Irritable bowel syndrome 10/24/2014  . Anxiety state 10/24/2014  . Preop testing 09/19/2014  . Pap smear for cervical cancer screening 09/19/2014  . Midline low back pain without sciatica 08/12/2014  . Heberden nodes 08/12/2014  . Contracture of joint, hand 08/12/2014  . Essential hypertension 02/11/2014  . Allergy 02/11/2014  . Screening for breast cancer 02/11/2014  . Dupuytren's contracture of both hands 11/15/2013  . Hallux valgus of right foot 11/15/2013  . Back pain 05/21/2013  . UTI (urinary tract infection) 05/21/2013  . Multiple skin nodules 11/27/2012  . Fibromyalgia 09/08/2012  . Osteoarthritis 09/08/2012  . Neck muscle spasm 07/14/2012  . Plantar wart 07/14/2012  . Insomnia 07/14/2012     Current Outpatient Medications on File Prior to Visit  Medication Sig Dispense Refill  . amLODipine (  NORVASC) 10 MG tablet Take 1 tablet (10 mg total) by mouth daily. 90 tablet 3  . cyclobenzaprine (FLEXERIL) 10 MG tablet TAKE 1 TABLET BY MOUTH 3 TIMES DAILY AS NEEDED FOR MUSCLE SPAMS 15 tablet 0  . diclofenac sodium (VOLTAREN) 1 % GEL Apply 4 g topically 4 (four) times daily. (Patient taking differently: Apply 4 g topically daily as needed (pain). ) 1 Tube 2  . dicyclomine (BENTYL) 10 MG capsule TAKE 1 CAPSULE BY MOUTH 4 TIMES DAILY AS NEEDED FOR SPASMS. 30 capsule 0  . diphenhydrAMINE (BENADRYL) 25 MG tablet Take 25 mg by  mouth daily as needed for allergies.    . DULoxetine (CYMBALTA) 60 MG capsule Take 1 capsule (60 mg total) by mouth 2 (two) times daily. 180 capsule 3  . ferrous gluconate (FERGON) 324 MG tablet Take 1 tablet (324 mg total) by mouth daily with breakfast. 30 tablet 0  . fluticasone (FLONASE) 50 MCG/ACT nasal spray PLACE 2 SPRAYS INTO BOTH NOSTRILS DAILY. 16 g 3  . gabapentin (NEURONTIN) 300 MG capsule Take 2 capsules (600 mg total) by mouth 3 (three) times daily. 540 capsule 3  . omeprazole (PRILOSEC) 20 MG capsule Take 2 capsules (40 mg total) by mouth daily. 180 capsule 3  . ondansetron (ZOFRAN-ODT) 8 MG disintegrating tablet TAKE 1 TABLET BY MOUTH EVERY 8 HOURS AS NEEDED FOR NAUSEA OR VOMITING. 20 tablet 0  . pravastatin (PRAVACHOL) 40 MG tablet Take 1 tablet (40 mg total) by mouth daily. 90 tablet 3   No current facility-administered medications on file prior to visit.     No Known Allergies  Social History   Socioeconomic History  . Marital status: Single    Spouse name: Not on file  . Number of children: 3  . Years of education: Not on file  . Highest education level: Not on file  Occupational History  . Occupation: Advertising account executive: [employer]  Social Needs  . Financial resource strain: Not on file  . Food insecurity:    Worry: Not on file    Inability: Not on file  . Transportation needs:    Medical: Not on file    Non-medical: Not on file  Tobacco Use  . Smoking status: Former Smoker    Last attempt to quit: 08/24/2012    Years since quitting: 5.0  . Smokeless tobacco: Never Used  Substance and Sexual Activity  . Alcohol use: Yes    Comment: occasionally  . Drug use: No  . Sexual activity: Yes    Birth control/protection: Surgical  Lifestyle  . Physical activity:    Days per week: Not on file    Minutes per session: Not on file  . Stress: Not on file  Relationships  . Social connections:    Talks on phone: Not on file    Gets together: Not on file     Attends religious service: Not on file    Active member of club or organization: Not on file    Attends meetings of clubs or organizations: Not on file    Relationship status: Not on file  . Intimate partner violence:    Fear of current or ex partner: Not on file    Emotionally abused: Not on file    Physically abused: Not on file    Forced sexual activity: Not on file  Other Topics Concern  . Not on file  Social History Narrative  . Not on file    Family History  Problem Relation Age of Onset  . Hypertension Mother   . Diabetes Mother   . Heart disease Mother   . Hypertension Father   . Diabetes Father   . Prostate cancer Father   . Heart disease Father   . Alcohol abuse Father   . Breast cancer Sister   . Hypertension Sister   . Diabetes Sister   . Heart disease Maternal Grandmother        Great GM  . Breast cancer Maternal Grandmother   . Bone cancer Maternal Grandmother   . Breast cancer Maternal Aunt   . Ovarian cancer Maternal Aunt   . Colon cancer Maternal Aunt        great aunt  . Rheum arthritis Sister   . Colon cancer Son     Past Surgical History:  Procedure Laterality Date  . BUNIONECTOMY Right 10/04/2014   Procedure:  Altamese [place], Emmaline Life;  Surgeon: Trula Slade, DPM;  Location: [facility];  Service: Podiatry;  Laterality: Right;  . COLONOSCOPY WITH PROPOFOL  02/13/2013  . CYSTOSCOPY N/A 07/07/2017   Procedure: CYSTOSCOPY;  Surgeon: Aletha Halim, MD;  Location: [facility];  Service: Gynecology;  Laterality: N/A;  . DUPUYTREN / PALMAR FASCIOTOMY Right 07/18/2013   exc. of multiple nodules  . 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 HAMMER TOE SURGERY Right 10/04/2014   Procedure: HAMMER TOE REPAIR 4TH  AND 5TH  RIGHT FOOT  fourth toe fixation with k wire;  Surgeon: Trula Slade, DPM;  Location: [facility];  Service: Podiatry;  Laterality: Right;  . LEG SURGERY Left    as a  child; near amputation of leg  . TUBAL LIGATION    . VAGINAL HYSTERECTOMY N/A 07/07/2017   Procedure: HYSTERECTOMY VAGINAL;  Surgeon: Aletha Halim, MD;  Location: [facility];  Service: Gynecology;  Laterality: N/A;    ROS: Review of Systems Negative except as stated above PHYSICAL EXAM: BP (!) 145/89   Pulse 88   Temp 98 F (36.7 C) (Oral)   Resp 16   Ht 5' 9.5" (1.765 m)   Wt 195 lb (88.5 kg)   LMP 07/04/2017   SpO2 96%   BMI 28.38 kg/m   Physical Exam  General appearance - alert, well appearing, and in no distress Mental status - normal mood, behavior, speech, dress, motor activity, and thought processes Mouth - mucous membranes moist, pharynx normal without lesions Neck - supple, no significant adenopathy Chest - clear to auscultation, no wheezes, rales or rhonchi, symmetric air entry Heart - normal rate, regular rhythm, normal S1, S2, no murmurs, rubs, clicks or gallops Neurological - cranial nerves II through XII intact, motor and sensory grossly normal bilaterally, normal muscle tone, no tremors. MSK:  Contractures both hands Extremities -no lower extremity edema   ASSESSMENT AND PLAN: 1. Essential hypertension -not at goal.  Did not take Amlodipine as yet for the day.  She will take when she returns home  2. Primary osteoarthritis involving multiple joints She definitely has arthritis changes seen on imaging done 01/2017 however she feels pain more in her buttocks than in the groin area which is the usual location for pain clinically relevant to DJD of the hips. -We will discontinue Tylenol with codeine and will try her with a Cox 2 inhibitor - meloxicam (MOBIC) 15 MG tablet; Take 1 tablet (15 mg total) by mouth daily.  Dispense: 30 tablet; Refill: 2  3. Dupuytren's contracture of both  hands Patient has requested referral back to hand surgeon at [facility].  Referral placed.  4. Dental caries - Ambulatory referral to Dentistry  5. Chronic daily  headache -Migraine versus medication overuse.  Advised to stop the [place] powder.  Try Excedrin Migraine as needed. If no improvement, will add Topamax or Propranolol - ondansetron (ZOFRAN) 4 MG tablet; Take 1 tablet (4 mg total) by mouth 2 (two) times daily as needed for nausea or vomiting.  Dispense: 20 tablet; Refill: 0  6. Anxiety Continue Cymbalta.  I decided not to refill clonazepam.  I usually use that as a bridge when first starting patients on SSRI or SNRI for anxiety. -We will prescribe hydroxyzine instead to use as needed. Refer to behavioral health. - hydrOXYzine (ATARAX/VISTARIL) 25 MG tablet; Take 1 tablet (25 mg total) by mouth 2 (two) times daily as needed for anxiety.  Dispense: 30 tablet; Refill: 2   Patient was given the opportunity to ask questions.  Patient verbalized understanding of the plan and was able to repeat key elements of the plan.   Orders Placed This Encounter  Procedures  . Ambulatory referral to Dentistry     Requested Prescriptions   Signed Prescriptions Disp Refills  . meloxicam (MOBIC) 15 MG tablet 30 tablet 2    Sig: Take 1 tablet (15 mg total) by mouth daily.  . hydrOXYzine (ATARAX/VISTARIL) 25 MG tablet 30 tablet 2    Sig: Take 1 tablet (25 mg total) by mouth 2 (two) times daily as needed for anxiety.  . ondansetron (ZOFRAN) 4 MG tablet 20 tablet 0    Sig: Take 1 tablet (4 mg total) by mouth 2 (two) times daily as needed for nausea or vomiting.    Return in about 7 weeks (around 11/10/2017).  Karle Plumber, MD, FACP

## 2017-10-13 ENCOUNTER — Encounter (HOSPITAL_COMMUNITY): Payer: Self-pay | Admitting: Emergency Medicine

## 2017-10-13 ENCOUNTER — Ambulatory Visit (HOSPITAL_COMMUNITY)
Admission: EM | Admit: 2017-10-13 | Discharge: 2017-10-13 | Disposition: A | Discharge status: Home / Self Care | Payer: Self-pay | Attending: Family Medicine | Admitting: Family Medicine

## 2017-10-13 DIAGNOSIS — K0889 Other specified disorders of teeth and supporting structures: Secondary | ICD-10-CM

## 2017-10-13 MED ORDER — PENICILLIN V POTASSIUM 500 MG PO TABS: 500.0000 mg | ORAL_TABLET | Freq: Four times a day (QID) | ORAL | 0 refills | Status: AC

## 2017-10-13 MED ORDER — TRAMADOL HCL 50 MG PO TABS: 50.0000 mg | ORAL_TABLET | Freq: Four times a day (QID) | ORAL | 0 refills | Status: DC | PRN

## 2017-10-13 NOTE — ED Provider Notes (Signed)
[facility]    CSN: [id number] Arrival date & time: 10/13/17  1411     History   Chief Complaint Chief Complaint  Patient presents with  . Dental Pain    HPI Isabel Evans is a 47 y.o. female.   Isabel Evans presents with complaints of dental pain which started two days ago. The tooth had broken off approximately 6 months ago but just recently became painful. No active drainage or foul taste. No fevers. Has required antibiotics in the past for similar. Facial soreness and swelling at times. Has been taking ibuprofen and using orajel which helps some. Also takes meloxicam daily for hip arthritis. Pain is moderate to severe. Swelling has improved this afternoon. History of anxiety, depression, gerd, fibromyalgia, ibs, htn, oa, pvd.     ROS per HPI.      Past Medical History:  Diagnosis Date  . Anxiety   . Bone spur    cervical spine  . Chronic kidney disease 04/2017   kidney infections  . Depression   . Deviated nasal septum    states is unable to lie flat, because her nose will become congested and she can stop breathing  . Dysrhythmia    heart flutters occasionally  . Fibromyalgia   . GERD (gastroesophageal reflux disease)   . Hallux abductovalgus with bunions 09/2014   right   . Hammertoe 09/2014   right 4th, 5th  . History of stomach ulcers   . Hypertension    states is under control with med., has been on med. x 8 mos.  . IBS (irritable bowel syndrome)    no current med.  . Osteoarthritis    hands, bilateral hips  . Peripheral vascular disease (HCC)    occasional swelling in both legs  . Sinus headache     Patient Active Problem List   Diagnosis Date Noted  . Blurry vision, bilateral 05/18/2017  . Callus of foot 05/18/2017  . Pain in joint of left hip 02/16/2017  . Bruises easily 11/10/2016  . Gastroesophageal reflux disease without esophagitis 09/22/2016  . Fatigue associated with anemia 09/22/2016  . Flexion contractures 09/22/2016  .  Generalized anxiety disorder 01/01/2016  . Dental abscess 01/01/2016  . Healthcare maintenance 05/08/2015  . Adjustment disorder with mixed anxiety and depressed mood 05/08/2015  . Stress at home 10/24/2014  . Irritable bowel syndrome 10/24/2014  . Anxiety state 10/24/2014  . Preop testing 09/19/2014  . Pap smear for cervical cancer screening 09/19/2014  . Midline low back pain without sciatica 08/12/2014  . Heberden nodes 08/12/2014  . Contracture of joint, hand 08/12/2014  . Essential hypertension 02/11/2014  . Allergy 02/11/2014  . Screening for breast cancer 02/11/2014  . Dupuytren's contracture of both hands 11/15/2013  . Hallux valgus of right foot 11/15/2013  . Back pain 05/21/2013  . UTI (urinary tract infection) 05/21/2013  . Multiple skin nodules 11/27/2012  . Fibromyalgia 09/08/2012  . Osteoarthritis 09/08/2012  . Neck muscle spasm 07/14/2012  . Plantar wart 07/14/2012  . Insomnia 07/14/2012    Past Surgical History:  Procedure Laterality Date  . BUNIONECTOMY Right 10/04/2014   Procedure:  Altamese [place], Emmaline Life;  Surgeon: Trula Slade, DPM;  Location: [facility];  Service: Podiatry;  Laterality: Right;  . COLONOSCOPY WITH PROPOFOL  02/13/2013  . CYSTOSCOPY N/A 07/07/2017   Procedure: CYSTOSCOPY;  Surgeon: Aletha Halim, MD;  Location: [facility];  Service: Gynecology;  Laterality: N/A;  . DUPUYTREN / PALMAR FASCIOTOMY Right 07/18/2013  exc. of multiple nodules  . 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 HAMMER TOE SURGERY Right 10/04/2014   Procedure: HAMMER TOE REPAIR 4TH  AND 5TH  RIGHT FOOT  fourth toe fixation with k wire;  Surgeon: Trula Slade, DPM;  Location: [facility];  Service: Podiatry;  Laterality: Right;  . 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 TUBAL LIGATION    . VAGINAL HYSTERECTOMY N/A 07/07/2017   Procedure: HYSTERECTOMY VAGINAL;  Surgeon: Aletha Halim, MD;  Location: [facility];  Service: Gynecology;  Laterality: N/A;    OB History    Gravida  3   Para  3   Term  3   Preterm  0   AB  0   Living  3     SAB  0   TAB  0   Ectopic  0   Multiple  0   Live Births           Obstetric Comments  SVD x 3         Home Medications    Prior to Admission medications   Medication Sig Start Date End Date Taking? Authorizing Provider  amLODipine (NORVASC) 10 MG tablet Take 1 tablet (10 mg total) by mouth daily. 05/18/17   Tresa Garter, MD  cyclobenzaprine (FLEXERIL) 10 MG tablet TAKE 1 TABLET BY MOUTH 3 TIMES DAILY AS NEEDED FOR MUSCLE SPAMS 09/16/17   Ladell Pier, MD  diclofenac sodium (VOLTAREN) 1 % GEL Apply 4 g topically 4 (four) times daily. Patient taking differently: Apply 4 g topically daily as needed (pain).  02/09/17   Tresa Garter, MD  dicyclomine (BENTYL) 10 MG capsule TAKE 1 CAPSULE BY MOUTH 4 TIMES DAILY AS NEEDED FOR SPASMS. 09/16/17   Ladell Pier, MD  diphenhydrAMINE (BENADRYL) 25 MG tablet Take 25 mg by mouth daily as needed for allergies.    [provider]  DULoxetine (CYMBALTA) 60 MG capsule Take 1 capsule (60 mg total) by mouth 2 (two) times daily. 05/18/17   Tresa Garter, MD  ferrous gluconate (FERGON) 324 MG tablet Take 1 tablet (324 mg total) by mouth daily with breakfast. 07/08/17   Aletha Halim, MD  fluticasone (FLONASE) 50 MCG/ACT nasal spray PLACE 2 SPRAYS INTO BOTH NOSTRILS DAILY. 07/08/17   Tresa Garter, MD  gabapentin (NEURONTIN) 300 MG capsule Take 2 capsules (600 mg total) by mouth 3 (three) times daily. 05/18/17   Tresa Garter, MD  hydrOXYzine (ATARAX/VISTARIL) 25 MG tablet Take 1 tablet (25 mg total) by mouth 2 (two) times daily as needed for anxiety. 09/22/17   Ladell Pier, MD  meloxicam (MOBIC) 15 MG tablet Take 1 tablet (15 mg total) by mouth daily. 09/22/17   Ladell Pier, MD  omeprazole (PRILOSEC) 20 MG capsule Take 2  capsules (40 mg total) by mouth daily. 09/22/16   Tresa Garter, MD  ondansetron (ZOFRAN) 4 MG tablet Take 1 tablet (4 mg total) by mouth 2 (two) times daily as needed for nausea or vomiting. 09/22/17   Ladell Pier, MD  ondansetron (ZOFRAN-ODT) 8 MG disintegrating tablet TAKE 1 TABLET BY MOUTH EVERY 8 HOURS AS NEEDED FOR NAUSEA OR VOMITING. 08/12/17   Gildardo Pounds, NP  penicillin v potassium (VEETID) 500 MG tablet Take 1 tablet (500 mg total) by mouth 4 (four) times daily for 7 days. 10/13/17 10/20/17  Zigmund Gottron,  NP  pravastatin (PRAVACHOL) 40 MG tablet Take 1 tablet (40 mg total) by mouth daily. 05/18/17   Tresa Garter, MD  traMADol (ULTRAM) 50 MG tablet Take 1 tablet (50 mg total) by mouth every 6 (six) hours as needed. 10/13/17   Zigmund Gottron, NP    Family History Family History  Problem Relation Age of Onset  . Hypertension Mother   . Diabetes Mother   . Heart disease Mother   . Hypertension Father   . Diabetes Father   . Prostate cancer Father   . Heart disease Father   . Alcohol abuse Father   . Breast cancer Sister   . Hypertension Sister   . Diabetes Sister   . Heart disease Maternal Grandmother        Great GM  . Breast cancer Maternal Grandmother   . Bone cancer Maternal Grandmother   . Breast cancer Maternal Aunt   . Ovarian cancer Maternal Aunt   . Colon cancer Maternal Aunt        great aunt  . Rheum arthritis Sister   . Colon cancer Son     Social History Social History   Tobacco Use  . Smoking status: Former Smoker    Last attempt to quit: 08/24/2012    Years since quitting: 5.1  . Smokeless tobacco: Never Used  Substance Use Topics  . Alcohol use: Yes    Comment: occasionally  . Drug use: No     Allergies   Patient has no known allergies.   Review of Systems Review of Systems   Physical Exam Triage Vital Signs ED Triage Vitals [10/13/17 1433]  Enc Vitals Group     BP 131/79     Pulse Rate 87     Resp 18     Temp  98.2 F (36.8 C)     Temp Source Oral     SpO2 100 %     Weight      Height      Head Circumference      Peak Flow      Pain Score      Pain Loc      Pain Edu?      Excl. in [place]?    No data found.  Updated Vital Signs BP 131/79 (BP Location: Left Arm)   Pulse 87   Temp 98.2 F (36.8 C) (Oral)   Resp 18   LMP 07/04/2017   SpO2 100%   Visual Acuity Right Eye Distance:   Left Eye Distance:   Bilateral Distance:    Right Eye Near:   Left Eye Near:    Bilateral Near:     Physical Exam  Constitutional: She is oriented to person, place, and time. She appears well-developed and well-nourished. No distress.  HENT:  Head: Normocephalic and atraumatic.  Mouth/Throat: Abnormal dentition.  Broken off tooth #7, broken all the way into gumline with tenderness at jaw line; no obvious abscess presence or drainage   Cardiovascular: Normal rate, regular rhythm and normal heart sounds.  Pulmonary/Chest: Effort normal and breath sounds normal.  Neurological: She is alert and oriented to person, place, and time.  Skin: Skin is warm and dry.     UC Treatments / Results  Labs (all labs ordered are listed, but only abnormal results are displayed) Labs Reviewed - No data to display  EKG None  Radiology No results found.  Procedures Procedures (including critical care time)  Medications Ordered in UC Medications - No  data to display  Initial Impression / Assessment and Plan / UC Course  I have reviewed the triage vital signs and the nursing notes.  Pertinent labs & imaging results that were available during my care of the patient were reviewed by me and considered in my medical decision making (see chart for details).     Course of penicillin. Patient is already taking daily meloxicam. 10 tabs of tramadol provided for breakthrough pain. Ice application. Dentist follow up for definitive treatment. Patient verbalized understanding and agreeable to plan.    Final Clinical  Impressions(s) / UC Diagnoses   Final diagnoses:  Pain, dental     Discharge Instructions     Complete course of antibiotics.  Stop additional ibuprofen if you are also taking daily meloxicam (mobic)  May take tramadol as needed, may cause drowsiness so do not take if driving or drinking alcohol.  Please follow up with dentist for definitive treatment.    ED Prescriptions    Medication Sig Dispense Auth. Provider   penicillin v potassium (VEETID) 500 MG tablet Take 1 tablet (500 mg total) by mouth 4 (four) times daily for 7 days. 28 tablet Augusto Gamble B, NP   traMADol (ULTRAM) 50 MG tablet Take 1 tablet (50 mg total) by mouth every 6 (six) hours as needed. 10 tablet Zigmund Gottron, NP     Controlled Substance Prescriptions [place] Controlled Substance Registry consulted? Yes, I have consulted the [place] Controlled Substances Registry for this patient, and feel the risk/benefit ratio today is favorable for proceeding with this prescription for a controlled substance.   Zigmund Gottron, NP 10/13/17 [phone]

## 2017-10-13 NOTE — Discharge Instructions (Signed)
Complete course of antibiotics.  Stop additional ibuprofen if you are also taking daily meloxicam (mobic)  May take tramadol as needed, may cause drowsiness so do not take if driving or drinking alcohol.  Please follow up with dentist for definitive treatment.

## 2017-10-13 NOTE — ED Triage Notes (Signed)
Pt c/o dental pain

## 2017-10-14 MED FILL — traMADol HCL 50 MG TABS: 50 | 3 days supply | Qty: 10 | Fill #0

## 2017-10-14 MED FILL — PENICILLIN VK 500 MG TABLET: 500 | 7 days supply | Qty: 28 | Fill #0

## 2017-10-18 ENCOUNTER — Other Ambulatory Visit: Payer: Self-pay | Admitting: Nurse Practitioner

## 2017-10-18 ENCOUNTER — Other Ambulatory Visit: Payer: Self-pay | Admitting: Internal Medicine

## 2017-10-18 DIAGNOSIS — F4323 Adjustment disorder with mixed anxiety and depressed mood: Secondary | ICD-10-CM

## 2017-10-18 MED ORDER — DICYCLOMINE HCL 10 MG PO CAPS: ORAL_CAPSULE | ORAL | 0 refills | Status: DC

## 2017-10-18 MED ORDER — CYCLOBENZAPRINE HCL 10 MG PO TABS: 10.0000 mg | ORAL_TABLET | Freq: Two times a day (BID) | ORAL | 0 refills | Status: DC | PRN

## 2017-10-19 ENCOUNTER — Other Ambulatory Visit: Payer: Self-pay

## 2017-10-19 DIAGNOSIS — K219 Gastro-esophageal reflux disease without esophagitis: Secondary | ICD-10-CM

## 2017-10-19 MED ORDER — ONDANSETRON 8 MG PO TBDP: 8.0000 mg | ORAL_TABLET | Freq: Every day | ORAL | 0 refills | Status: DC | PRN

## 2017-10-19 MED ORDER — OMEPRAZOLE 20 MG PO CPDR: 40.0000 mg | DELAYED_RELEASE_CAPSULE | Freq: Every day | ORAL | 3 refills | Status: DC

## 2017-10-19 MED FILL — DICYCLOMINE 10 MG CAPSULE: 10 | 7 days supply | Qty: 30 | Fill #0

## 2017-10-19 MED FILL — MELOXICAM 15 MG TABLET: 15 | 30 days supply | Qty: 30 | Fill #1

## 2017-10-19 MED FILL — ?DULoxetine HCL 6OMG CP: 60 | 30 days supply | Qty: 60 | Fill #4

## 2017-10-19 MED FILL — ?OMEPRazole 20mg CPDR: 20 | 30 days supply | Qty: 60 | Fill #0

## 2017-10-19 MED FILL — hydrOXYzine HCL 25 MG TABS: 25 | 15 days supply | Qty: 30 | Fill #1

## 2017-10-19 MED FILL — CYCLOBENZAPRINE 10 MG TAB: 10 | 7 days supply | Qty: 15 | Fill #0

## 2017-10-19 MED FILL — AMLODIPINE BESYLATE 10 MG T: 10 | 30 days supply | Qty: 30 | Fill #0

## 2017-10-19 MED FILL — ONDANSETRON ODT 8 MG TABLET: 8 | 20 days supply | Qty: 20 | Fill #0

## 2017-10-19 MED FILL — PRAVASTATIN NA 40 MG TAB: 40 | 30 days supply | Qty: 30 | Fill #0

## 2017-10-19 MED FILL — GABAPENTIN 300 MG CAPSULE: 300 | 30 days supply | Qty: 180 | Fill #3

## 2017-11-15 ENCOUNTER — Ambulatory Visit: Payer: Self-pay | Admitting: Family Medicine

## 2017-11-24 ENCOUNTER — Other Ambulatory Visit: Payer: Self-pay | Admitting: Internal Medicine

## 2017-11-24 ENCOUNTER — Encounter: Payer: Self-pay | Admitting: Family Medicine

## 2017-11-24 ENCOUNTER — Ambulatory Visit: Payer: Self-pay | Attending: Internal Medicine | Admitting: Family Medicine

## 2017-11-24 ENCOUNTER — Other Ambulatory Visit: Payer: Self-pay

## 2017-11-24 VITALS — BP 129/85 | HR 85 | Temp 98.5°F | Resp 16 | Wt 191.6 lb

## 2017-11-24 DIAGNOSIS — I129 Hypertensive chronic kidney disease with stage 1 through stage 4 chronic kidney disease, or unspecified chronic kidney disease: Secondary | ICD-10-CM | POA: Insufficient documentation

## 2017-11-24 DIAGNOSIS — I739 Peripheral vascular disease, unspecified: Secondary | ICD-10-CM | POA: Insufficient documentation

## 2017-11-24 DIAGNOSIS — Z780 Asymptomatic menopausal state: Secondary | ICD-10-CM | POA: Insufficient documentation

## 2017-11-24 DIAGNOSIS — N951 Menopausal and female climacteric states: Secondary | ICD-10-CM

## 2017-11-24 DIAGNOSIS — Z833 Family history of diabetes mellitus: Secondary | ICD-10-CM | POA: Insufficient documentation

## 2017-11-24 DIAGNOSIS — F4323 Adjustment disorder with mixed anxiety and depressed mood: Secondary | ICD-10-CM

## 2017-11-24 DIAGNOSIS — Z87891 Personal history of nicotine dependence: Secondary | ICD-10-CM | POA: Insufficient documentation

## 2017-11-24 DIAGNOSIS — M62838 Other muscle spasm: Secondary | ICD-10-CM

## 2017-11-24 DIAGNOSIS — N189 Chronic kidney disease, unspecified: Secondary | ICD-10-CM | POA: Insufficient documentation

## 2017-11-24 DIAGNOSIS — R7303 Prediabetes: Secondary | ICD-10-CM | POA: Insufficient documentation

## 2017-11-24 DIAGNOSIS — M797 Fibromyalgia: Secondary | ICD-10-CM

## 2017-11-24 DIAGNOSIS — G8929 Other chronic pain: Secondary | ICD-10-CM | POA: Insufficient documentation

## 2017-11-24 DIAGNOSIS — M25552 Pain in left hip: Secondary | ICD-10-CM

## 2017-11-24 DIAGNOSIS — M1990 Unspecified osteoarthritis, unspecified site: Secondary | ICD-10-CM | POA: Insufficient documentation

## 2017-11-24 DIAGNOSIS — K219 Gastro-esophageal reflux disease without esophagitis: Secondary | ICD-10-CM | POA: Insufficient documentation

## 2017-11-24 DIAGNOSIS — M5030 Other cervical disc degeneration, unspecified cervical region: Secondary | ICD-10-CM | POA: Insufficient documentation

## 2017-11-24 DIAGNOSIS — D649 Anemia, unspecified: Secondary | ICD-10-CM | POA: Insufficient documentation

## 2017-11-24 DIAGNOSIS — Z8719 Personal history of other diseases of the digestive system: Secondary | ICD-10-CM | POA: Insufficient documentation

## 2017-11-24 DIAGNOSIS — F411 Generalized anxiety disorder: Secondary | ICD-10-CM

## 2017-11-24 DIAGNOSIS — K589 Irritable bowel syndrome without diarrhea: Secondary | ICD-10-CM | POA: Insufficient documentation

## 2017-11-24 DIAGNOSIS — M5136 Other intervertebral disc degeneration, lumbar region: Secondary | ICD-10-CM | POA: Insufficient documentation

## 2017-11-24 DIAGNOSIS — Z79899 Other long term (current) drug therapy: Secondary | ICD-10-CM

## 2017-11-24 DIAGNOSIS — I10 Essential (primary) hypertension: Secondary | ICD-10-CM

## 2017-11-24 HISTORY — DX: Anemia, unspecified: D64.9

## 2017-11-24 LAB — GLUCOSE, POCT (MANUAL RESULT ENTRY): POC Glucose: 104 mg/dL — AB (ref 70–99)

## 2017-11-24 LAB — POCT GLYCOSYLATED HEMOGLOBIN (HGB A1C): Hemoglobin A1C: 6.1 % — AB (ref 4.0–5.6)

## 2017-11-24 MED ORDER — CLONAZEPAM 0.5 MG PO TABS: 0.5000 mg | ORAL_TABLET | Freq: Every day | ORAL | 0 refills | Status: DC

## 2017-11-24 MED ORDER — GABAPENTIN 300 MG PO CAPS: ORAL_CAPSULE | ORAL | 6 refills | Status: DC

## 2017-11-24 MED ORDER — VENLAFAXINE HCL ER 75 MG PO CP24: 75.0000 mg | ORAL_CAPSULE | Freq: Every day | ORAL | 6 refills | Status: DC

## 2017-11-24 MED ORDER — CYCLOBENZAPRINE HCL 10 MG PO TABS: 10.0000 mg | ORAL_TABLET | Freq: Every day | ORAL | 3 refills | Status: DC

## 2017-11-24 MED FILL — clonazePAM 0.5 MG TABS: 0.5 | 30 days supply | Qty: 30 | Fill #0

## 2017-11-24 MED FILL — CYCLOBENZAPRINE 10 MG TAB: 10 | 30 days supply | Qty: 30 | Fill #0

## 2017-11-24 MED FILL — VENLAFAXINE HCL ER 75 MG CA: 75 | 30 days supply | Qty: 30 | Fill #0

## 2017-11-24 MED FILL — GABAPENTIN 300 MG CAPSULE: 300 | 30 days supply | Qty: 120 | Fill #0

## 2017-11-24 NOTE — Patient Instructions (Signed)

## 2017-11-24 NOTE — Progress Notes (Signed)
Establish care:  No history of DM but request to have blood sugar checked  Med RF: cyclobenazaprine,zofran- ODT, bentyl and gabapentin
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
## 2017-11-28 ENCOUNTER — Other Ambulatory Visit: Payer: Self-pay | Admitting: Internal Medicine

## 2017-11-28 MED FILL — OMEPRAZOLE 20 MG CPDR: 20 | 30 days supply | Qty: 60 | Fill #1

## 2017-11-28 MED FILL — AMLODIPINE BESYLATE 10 MG T: 10 | 30 days supply | Qty: 30 | Fill #1

## 2017-11-28 MED FILL — PRAVASTATIN NA 40 MG TAB: 40 | 30 days supply | Qty: 30 | Fill #1

## 2017-11-28 MED FILL — DULoxetine HCL 60 MG CPEP: 60 | 30 days supply | Qty: 60 | Fill #5

## 2017-11-28 MED FILL — FLUTICASONE PROP 50 MCG SPR: 50 | 30 days supply | Qty: 16 | Fill #1

## 2017-11-30 ENCOUNTER — Other Ambulatory Visit: Payer: Self-pay

## 2017-11-30 DIAGNOSIS — R51 Headache: Principal | ICD-10-CM

## 2017-11-30 DIAGNOSIS — R519 Headache, unspecified: Secondary | ICD-10-CM

## 2017-12-01 MED ORDER — ONDANSETRON 8 MG PO TBDP: 8.0000 mg | ORAL_TABLET | Freq: Every day | ORAL | 0 refills | Status: DC | PRN

## 2017-12-01 MED ORDER — DICYCLOMINE HCL 10 MG PO CAPS: ORAL_CAPSULE | ORAL | 0 refills | Status: DC

## 2017-12-05 ENCOUNTER — Other Ambulatory Visit: Payer: Self-pay | Admitting: Family Medicine

## 2017-12-26 ENCOUNTER — Ambulatory Visit: Payer: Self-pay | Attending: Family Medicine | Admitting: Family Medicine

## 2017-12-26 ENCOUNTER — Other Ambulatory Visit: Payer: Self-pay | Admitting: Family Medicine

## 2017-12-26 ENCOUNTER — Encounter: Payer: Self-pay | Admitting: Family Medicine

## 2017-12-26 ENCOUNTER — Other Ambulatory Visit: Payer: Self-pay

## 2017-12-26 ENCOUNTER — Ambulatory Visit: Payer: Self-pay | Attending: Family Medicine | Admitting: Licensed Clinical Social Worker

## 2017-12-26 VITALS — BP 138/88 | HR 87 | Temp 98.4°F | Resp 18 | Ht 71.0 in | Wt 190.8 lb

## 2017-12-26 DIAGNOSIS — F419 Anxiety disorder, unspecified: Secondary | ICD-10-CM

## 2017-12-26 DIAGNOSIS — Z8719 Personal history of other diseases of the digestive system: Secondary | ICD-10-CM | POA: Insufficient documentation

## 2017-12-26 DIAGNOSIS — M546 Pain in thoracic spine: Secondary | ICD-10-CM | POA: Insufficient documentation

## 2017-12-26 DIAGNOSIS — M24542 Contracture, left hand: Secondary | ICD-10-CM | POA: Insufficient documentation

## 2017-12-26 DIAGNOSIS — K582 Mixed irritable bowel syndrome: Secondary | ICD-10-CM

## 2017-12-26 DIAGNOSIS — M720 Palmar fascial fibromatosis [Dupuytren]: Secondary | ICD-10-CM

## 2017-12-26 DIAGNOSIS — K589 Irritable bowel syndrome without diarrhea: Secondary | ICD-10-CM | POA: Insufficient documentation

## 2017-12-26 DIAGNOSIS — I129 Hypertensive chronic kidney disease with stage 1 through stage 4 chronic kidney disease, or unspecified chronic kidney disease: Secondary | ICD-10-CM | POA: Insufficient documentation

## 2017-12-26 DIAGNOSIS — F331 Major depressive disorder, recurrent, moderate: Secondary | ICD-10-CM

## 2017-12-26 DIAGNOSIS — M79641 Pain in right hand: Secondary | ICD-10-CM | POA: Insufficient documentation

## 2017-12-26 DIAGNOSIS — D649 Anemia, unspecified: Secondary | ICD-10-CM

## 2017-12-26 DIAGNOSIS — Z87891 Personal history of nicotine dependence: Secondary | ICD-10-CM | POA: Insufficient documentation

## 2017-12-26 DIAGNOSIS — M62838 Other muscle spasm: Secondary | ICD-10-CM

## 2017-12-26 DIAGNOSIS — M79642 Pain in left hand: Secondary | ICD-10-CM | POA: Insufficient documentation

## 2017-12-26 DIAGNOSIS — K219 Gastro-esophageal reflux disease without esophagitis: Secondary | ICD-10-CM | POA: Insufficient documentation

## 2017-12-26 DIAGNOSIS — I739 Peripheral vascular disease, unspecified: Secondary | ICD-10-CM | POA: Insufficient documentation

## 2017-12-26 DIAGNOSIS — M24541 Contracture, right hand: Secondary | ICD-10-CM | POA: Insufficient documentation

## 2017-12-26 DIAGNOSIS — M1990 Unspecified osteoarthritis, unspecified site: Secondary | ICD-10-CM | POA: Insufficient documentation

## 2017-12-26 DIAGNOSIS — F32A Depression, unspecified: Secondary | ICD-10-CM

## 2017-12-26 DIAGNOSIS — F4323 Adjustment disorder with mixed anxiety and depressed mood: Secondary | ICD-10-CM

## 2017-12-26 DIAGNOSIS — F329 Major depressive disorder, single episode, unspecified: Secondary | ICD-10-CM

## 2017-12-26 DIAGNOSIS — Z79899 Other long term (current) drug therapy: Secondary | ICD-10-CM

## 2017-12-26 DIAGNOSIS — M797 Fibromyalgia: Secondary | ICD-10-CM

## 2017-12-26 DIAGNOSIS — E8881 Metabolic syndrome: Secondary | ICD-10-CM | POA: Insufficient documentation

## 2017-12-26 DIAGNOSIS — G8929 Other chronic pain: Secondary | ICD-10-CM | POA: Insufficient documentation

## 2017-12-26 DIAGNOSIS — Z23 Encounter for immunization: Secondary | ICD-10-CM | POA: Insufficient documentation

## 2017-12-26 DIAGNOSIS — R7303 Prediabetes: Secondary | ICD-10-CM

## 2017-12-26 DIAGNOSIS — Z7984 Long term (current) use of oral hypoglycemic drugs: Secondary | ICD-10-CM | POA: Insufficient documentation

## 2017-12-26 DIAGNOSIS — M542 Cervicalgia: Secondary | ICD-10-CM

## 2017-12-26 MED ORDER — CYCLOBENZAPRINE HCL 10 MG PO TABS: 10.0000 mg | ORAL_TABLET | Freq: Every day | ORAL | 3 refills | Status: DC

## 2017-12-26 MED ORDER — DULOXETINE HCL 20 MG PO CPEP: 20.0000 mg | ORAL_CAPSULE | Freq: Every day | ORAL | 3 refills | Status: DC

## 2017-12-26 MED ORDER — VENLAFAXINE HCL ER 150 MG PO CP24: 150.0000 mg | ORAL_CAPSULE | Freq: Every day | ORAL | 4 refills | Status: DC

## 2017-12-26 MED ORDER — METFORMIN HCL 500 MG PO TABS: 500.0000 mg | ORAL_TABLET | Freq: Every day | ORAL | 6 refills | Status: DC

## 2017-12-26 MED ORDER — DICYCLOMINE HCL 10 MG PO CAPS: ORAL_CAPSULE | ORAL | 6 refills | Status: DC

## 2017-12-26 NOTE — Progress Notes (Signed)
Flu shot. Neck, shoulder back,  Refills on flexeril, klonopin, bentyl, Zofran

## 2017-12-26 NOTE — Progress Notes (Signed)
Subjective:    Patient ID: Isabel Evans, female    DOB: [birth year], 47 y.o.   MRN: [id number]  HPI 47 year old female who was seen in follow-up of elevated blood sugar with abnormal hemoglobin A1c at her last visit as well as new medication, Effexor, which was prescribed to help with her anxiety as well as with perimenopausal symptoms.  Patient states that she has noticed decreased hot flashes and sweating since being on the Effexor.  Patient however cannot yet tell if the Effexor is having a big effect on her anxiety/depression.  Patient states that she continues to have issues with chronic pain especially in her neck/upper back as well as her bilateral hands and patient also now with painful nodules on her feet.  Patient states that she works in a [employer] at a [employer] and patient has had to call out secondary to her pain.  Patient states that she can afford to not work but she often has so much pain that she cannot make it through a workday.  Patient also states that she needs refills of some medications at today's visit.  Patient reports that she needs refill of medication for her IBS.  Patient states that currently her IBS is more constipation predominant.  Patient however reports no abdominal pain at today's visit and no nausea.  Patient reports continued issues with muscle spasms, especially stiffness in her neck and upper back.  Patient also with continued issues with recurrent sharp, fleeting pain and achiness in her muscles which she believes is due to her fibromyalgia. Past Medical History:  Diagnosis Date  . Anemia 11/24/2017  . Anxiety   . Bone spur    cervical spine  . Chronic kidney disease 04/2017   kidney infections  . Depression   . Deviated nasal septum    states is unable to lie flat, because her nose will become congested and she can stop breathing  . Dysrhythmia    heart flutters occasionally  . Fibromyalgia   . GERD (gastroesophageal reflux disease)   . Hallux  abductovalgus with bunions 09/2014   right   . Hammertoe 09/2014   right 4th, 5th  . History of stomach ulcers   . Hypertension    states is under control with med., has been on med. x 8 mos.  . IBS (irritable bowel syndrome)    no current med.  . Osteoarthritis    hands, bilateral hips  . Peripheral vascular disease (HCC)    occasional swelling in both legs  . Sinus headache    Past Surgical History:  Procedure Laterality Date  . BUNIONECTOMY Right 10/04/2014   Procedure:  Altamese Asher, Emmaline Life;  Surgeon: Trula Slade, DPM;  Location: [facility];  Service: Podiatry;  Laterality: Right;  . COLONOSCOPY WITH PROPOFOL  02/13/2013  . CYSTOSCOPY N/A 07/07/2017   Procedure: CYSTOSCOPY;  Surgeon: Aletha Halim, MD;  Location: [facility];  Service: Gynecology;  Laterality: N/A;  . DUPUYTREN / PALMAR FASCIOTOMY Right 07/18/2013   exc. of multiple nodules  . 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 HAMMER TOE SURGERY Right 10/04/2014   Procedure: HAMMER TOE REPAIR 4TH  AND 5TH  RIGHT FOOT  fourth toe fixation with k wire;  Surgeon: Trula Slade, DPM;  Location: [facility];  Service: Podiatry;  Laterality: Right;  . 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 TUBAL LIGATION    .  VAGINAL HYSTERECTOMY N/A 07/07/2017   Procedure: HYSTERECTOMY VAGINAL;  Surgeon: Aletha Halim, MD;  Location: [facility];  Service: Gynecology;  Laterality: N/A;   Family History  Problem Relation Age of Onset  . Hypertension Mother   . Diabetes Mother   . Heart disease Mother   . Hypertension Father   . Diabetes Father   . Prostate cancer Father   . Heart disease Father   . Alcohol abuse Father   . Breast cancer Sister   . Hypertension Sister   . Diabetes Sister   . Heart disease Maternal Grandmother        Great GM  . Breast cancer Maternal Grandmother   . Bone cancer Maternal Grandmother   . Breast cancer Maternal  Aunt   . Ovarian cancer Maternal Aunt   . Colon cancer Maternal Aunt        great aunt  . Rheum arthritis Sister   . Colon cancer Son    Social History   Tobacco Use  . Smoking status: Former Smoker    Last attempt to quit: 08/24/2012    Years since quitting: 5.3  . Smokeless tobacco: Never Used  Substance Use Topics  . Alcohol use: Yes    Comment: occasionally  . Drug use: No  No Known Allergies   Review of Systems  Constitutional: Positive for fatigue. Negative for chills and fever.  Respiratory: Negative for cough and shortness of breath.   Cardiovascular: Negative for chest pain and palpitations.  Gastrointestinal: Positive for constipation. Negative for abdominal pain.  Genitourinary: Negative for dysuria and frequency.  Musculoskeletal: Positive for arthralgias, back pain, joint swelling, myalgias, neck pain and neck stiffness.  Neurological: Negative for dizziness and headaches.  Psychiatric/Behavioral: Negative for suicidal ideas. The patient is nervous/anxious.        Objective:   Physical Exam  BP 138/88   Pulse 87   Temp 98.4 F (36.9 C) (Oral)   Resp 18   Ht 5\' 11"  (1.803 m)   Wt 190 lb 12.8 oz (86.5 kg)   LMP 07/04/2017   SpO2 97%   BMI 26.61 kg/m  Vital signs and nurse's notes reviewed General- well-nourished, well-developed overweight older female in no acute distress Neck- supple but some spasm in the posterior neck/upper back and some discomfort to palpation over C5-C7 area.  No thyromegaly.  No lymphadenopathy Lungs-clear to auscultation bilaterally. Cardiovascular-regular rate and rhythm. Abdomen-soft, nontender, mild abdominal distention Back-no CVA tenderness.  Patient does have lower thoracic to S1 area discomfort with palpation as well as bilateral SI joint tenderness and bilateral thoracolumbar paraspinous spasm Musculoskeletal- patient with contractures of the hands bilaterally right greater than left with palmar fibrous tendinopathy Psych-  normal mood and judgment and interacts appropriately, patient is very talkative       Assessment & Plan:  1. Pre-diabetes Patient with hemoglobin A1c done at her last visit which was abnormal at 6.1.  Patient will be placed on metformin 500 mg daily to help with insulin resistance and patient is encouraged to follow a low carbohydrate diet, eliminate concentrated sweets.  Patient should stay as active as possible but patient has limitations secondary to her chronic pain issues.  Patient will have repeat glucose and hemoglobin A1c in the next 3 to 4 months.  Patient should call or return sooner if she has issues with increased thirst, urinary frequency, visual changes, increased fatigue or any concerns.  Patient will be having repeat CMP at today's visit and follow-up of chronic medication  use. - Comprehensive metabolic panel - metFORMIN (GLUCOPHAGE) 500 MG tablet; Take 1 tablet (500 mg total) by mouth daily with breakfast.  Dispense: 30 tablet; Refill: 6  2. Anxiety and depression Patient reports continued issues with anxiety and depression.  Patient's Effexor will be increased from 75 to 150 mg.  Patient did have consultation at her last visit by the social worker and patient does agree to referral for further evaluation and treatment by psychiatry.  Patient also received consultation from social work at today's visit. - Comprehensive metabolic panel - Ambulatory referral to Psychiatry  3. Dupuytren's contracture of both hands Patient with bilateral hand contractures.  Patient states that she has had surgery in the past but the results did not last for very long before patient had worsening of her symptoms.  Patient states that she has difficulty holding onto manipulating objects secondary to the contractures in her hands as well as the pain related to contractures.  Patient would like to be referred back to a hand surgeon for further evaluation and treatment  4. Neck pain Patient was complaint  of chronic issues with neck pain and muscle spasm.  Patient also with some symptoms that may be radicular in nature.  Patient has had x-ray showing spondylosis in the thoracic and lumbar spine and this is likely also present in the cervical spine as well.  Patient declines order for x-ray at this time as she believes she has had other imaging done in the past  5. Muscle spasm Patient with complaint of generalized issues with muscle spasm but especially in the upper back and neck.  Patient will have CMP to also look for any electrolyte abnormality that could be contributing to her muscle spasm.  6. Neck muscle spasm Patient with complaint of chronic issues with muscle spasms in her neck and back.  Patient requests refill of Flexeril which she states does help with her muscle spasms.  Prescription provided for refill of Flexeril 10 mg at bedtime to take as needed for muscle spasm - cyclobenzaprine (FLEXERIL) 10 MG tablet; Take 1 tablet (10 mg total) by mouth at bedtime. Take as needed for muscle spasms  Dispense: 30 tablet; Refill: 3  7. Fibromyalgia Patient states that she has had prior diagnosis of fibromyalgia and tends to have generalized muscle aches/pain patient requesting refill of Flexeril to help with her muscle spasms and muscle pain.  Patient has also been on Cymbalta for both fibromyalgia and anxiety/depression.  Patient was recently started on Effexor and the dose of this medication will be increased.  Patient Cymbalta is being decreased and will possibly be discontinued at some point.  Patient is having some anemia which the Cymbalta could be contributing to.  Dose will be tapered in the hopes that patient will be able to tolerate tapering rather than abrupt cessation of medication - cyclobenzaprine (FLEXERIL) 10 MG tablet; Take 1 tablet (10 mg total) by mouth at bedtime. Take as needed for muscle spasms  Dispense: 30 tablet; Refill: 3 - DULoxetine (CYMBALTA) 20 MG capsule; Take 1 capsule (20  mg total) by mouth daily.  Dispense: 30 capsule; Refill: 3    8. Irritable bowel syndrome with both constipation and diarrhea Patient provided with refill of Bentyl to take as needed for abdominal discomfort associated with IBS - dicyclomine (BENTYL) 10 MG capsule; TAKE 1 CAPSULE BY MOUTH 4 TIMES DAILY AS NEEDED FOR SPASMS.  Dispense: 60 capsule; Refill: 6  9. Encounter for long-term (current) use of medications Patient will  have CMP and CBC done in follow-up of long-term medications and patient has also had some anemia on past labs - Comprehensive metabolic panel - CBC with Differential  10. Anemia, unspecified type Patient with hemoglobin of 9.0 on labs done 07/08/2017 with improvement to 10.9 on repeat CBC for 419.  Patient will have CBC at today's visit and follow-up of her anemia. - CBC with Differential  11. Need for immunization against influenza Patient was offered and agreed to have influenza immunization done at today's visit.  Patient also received educational handout regarding influenza immunization - Flu Vaccine QUAD 36+ mos IM  An After Visit Summary was printed and given to the patient.  Return in about 3 months (around 03/27/2018).

## 2017-12-27 ENCOUNTER — Other Ambulatory Visit: Payer: Self-pay | Admitting: Family Medicine

## 2017-12-27 MED ORDER — ONDANSETRON 8 MG PO TBDP: 8.0000 mg | ORAL_TABLET | Freq: Every day | ORAL | 0 refills | Status: DC | PRN

## 2017-12-27 NOTE — Telephone Encounter (Signed)
Patient refill request -

## 2017-12-27 NOTE — BH Specialist Note (Signed)
Integrated Behavioral Health Initial Visit  MRN: [id number] Name: Isabel Evans  Number of [place] Clinician visits:: 1/6 Session Start time: 4:30 PM  Session End time: 5:00 PM Total time: 30 minutes  Type of Service: [place] Interpretor:No. Interpretor Name and Language: N/A   Warm Hand Off Completed.       SUBJECTIVE: Isabel Evans is a 47 y.o. female accompanied by self Patient was referred by Dr. Chapman Fitch for depression and anxiety. Patient reports the following symptoms/concerns: feelings of sadness and worry, difficulty sleeping, low energy, decreased concentration, and irritability Duration of problem: Ongoing; Severity of problem: severe  OBJECTIVE: Mood: Anxious and Affect: Depressed Risk of harm to self or others: No plan to harm self or others Pt scored positive on PHQ-9; however, denies current SI/HI/AVH. Protective factors were identified, safety plan was discussed, and crisis intervention resources were provided.  LIFE CONTEXT: Family and Social: Pt receives support from family and boyfriend School/Work: Pt is employed part-time and receives food stamps Self-Care: Pt participates in medication management to assist in coping with mental health. She also is involved in online support groups Life Changes: Pt has ongoing medical conditions that result in chronic pain. She is experiencing financial strain.  GOALS ADDRESSED: Patient will: 1. Reduce symptoms of: anxiety, depression and stress 2. Increase knowledge and/or ability of: self-management skills  3. Demonstrate ability to: Increase healthy adjustment to current life circumstances  INTERVENTIONS: Interventions utilized: Solution-Focused Strategies, Supportive Counseling and Link to [employer]  Standardized Assessments completed: GAD-7 and PHQ 2&9 with C-SSRS  ASSESSMENT: Patient currently experiencing depression, anxiety, and stress  triggered by ongoing medical conditions that result in chronic pain. She is experiencing financial strain due to limited ability to work additional hours. Pt reports feelings of sadness and worry, difficulty sleeping, low energy, decreased concentration, and irritability. Pt scored positive on PHQ-9; however, denies current SI/HI/AVH. Protective factors were identified, safety plan was discussed, and crisis intervention resources were provided.   Patient participates in medication management. She reports having a support system and is active on an online support group. Mindfulness interventions were discussed to assist with pain management. A Legal Aid referral was completed to assist with appeal for SSI.   PLAN: 1. Follow up with behavioral health clinician on : Pt was encouraged to contact [facility] if symptoms worsen or fail to improve to schedule behavioral appointments at [facility]. 2. Behavioral recommendations: [facility] recommends that pt apply healthy coping skills discussed, re-certify with financial counseling, and utilize provided resources. Pt is encouraged to schedule follow up appointment with [facility] 3. Referral(s): [facility] (In Clinic) and Legal Aid 4. "From scale of 1-10, how likely are you to follow plan?":   Rebekah Chesterfield, LCSW 12/29/17 2:32 PM

## 2018-01-02 ENCOUNTER — Other Ambulatory Visit: Payer: Self-pay | Admitting: Family Medicine

## 2018-01-02 ENCOUNTER — Ambulatory Visit: Payer: Self-pay | Attending: Family Medicine

## 2018-01-02 DIAGNOSIS — F329 Major depressive disorder, single episode, unspecified: Secondary | ICD-10-CM | POA: Insufficient documentation

## 2018-01-02 DIAGNOSIS — Z79899 Other long term (current) drug therapy: Secondary | ICD-10-CM | POA: Insufficient documentation

## 2018-01-02 DIAGNOSIS — R7303 Prediabetes: Secondary | ICD-10-CM | POA: Insufficient documentation

## 2018-01-02 DIAGNOSIS — F411 Generalized anxiety disorder: Secondary | ICD-10-CM

## 2018-01-02 DIAGNOSIS — F419 Anxiety disorder, unspecified: Secondary | ICD-10-CM | POA: Insufficient documentation

## 2018-01-02 MED FILL — ?PRAVASTATIN NA 40 MG TAB: 40 | 30 days supply | Qty: 30 | Fill #2

## 2018-01-02 MED FILL — ?OMEPRazole 20mg CPDR: 20 | 30 days supply | Qty: 60 | Fill #2

## 2018-01-02 MED FILL — GABAPENTIN 300 MG CAPSULE: 300 | 30 days supply | Qty: 120 | Fill #1

## 2018-01-02 MED FILL — FLUTICASONE PROP 50 MCG SPR: 50 | 30 days supply | Qty: 16 | Fill #2

## 2018-01-02 MED FILL — AMLODIPINE BESYLATE 10 MG T: 10 | 30 days supply | Qty: 30 | Fill #2

## 2018-01-02 NOTE — Progress Notes (Signed)
Patient here for lab visit only 

## 2018-01-02 NOTE — Telephone Encounter (Signed)
1) Medication(s) Requested (by name): clonazePAM (KLONOPIN) 0.5 MG tablet [[id number]]   2) Pharmacy of Choice: [place], [place]. 3) Special Requests:   Approved medications will be sent to the pharmacy, we will reach out if there is an issue.  Requests made after 3pm may not be addressed until the following business day!  If a patient is unsure of the name of the medication(s) please note and ask patient to call back when they are able to provide all info, do not send to responsible party until all information is available!

## 2018-01-03 LAB — COMPREHENSIVE METABOLIC PANEL WITH GFR
ALT: 13 IU/L (ref 0–32)
AST: 20 IU/L (ref 0–40)
Albumin/Globulin Ratio: 2 (ref 1.2–2.2)
Albumin: 5 g/dL (ref 3.5–5.5)
Alkaline Phosphatase: 92 IU/L (ref 39–117)
BUN/Creatinine Ratio: 11 (ref 9–23)
BUN: 9 mg/dL (ref 6–24)
Bilirubin Total: 0.2 mg/dL (ref 0.0–1.2)
CO2: 23 mmol/L (ref 20–29)
Calcium: 9.9 mg/dL (ref 8.7–10.2)
Chloride: 97 mmol/L (ref 96–106)
Creatinine, Ser: 0.84 mg/dL (ref 0.57–1.00)
GFR calc Af Amer: 96 mL/min/1.73
GFR calc non Af Amer: 84 mL/min/1.73
Globulin, Total: 2.5 g/dL (ref 1.5–4.5)
Glucose: 93 mg/dL (ref 65–99)
Potassium: 3.9 mmol/L (ref 3.5–5.2)
Sodium: 136 mmol/L (ref 134–144)
Total Protein: 7.5 g/dL (ref 6.0–8.5)

## 2018-01-03 LAB — CBC WITH DIFFERENTIAL/PLATELET
Basophils Absolute: 0.1 x10E3/uL (ref 0.0–0.2)
Basos: 1 %
EOS (ABSOLUTE): 0.2 x10E3/uL (ref 0.0–0.4)
Eos: 3 %
Hematocrit: 41.6 % (ref 34.0–46.6)
Hemoglobin: 13.8 g/dL (ref 11.1–15.9)
Immature Grans (Abs): 0 x10E3/uL (ref 0.0–0.1)
Immature Granulocytes: 0 %
Lymphocytes Absolute: 2.2 x10E3/uL (ref 0.7–3.1)
Lymphs: 27 %
MCH: 30.1 pg (ref 26.6–33.0)
MCHC: 33.2 g/dL (ref 31.5–35.7)
MCV: 91 fL (ref 79–97)
Monocytes Absolute: 0.6 x10E3/uL (ref 0.1–0.9)
Monocytes: 7 %
Neutrophils Absolute: 5.1 x10E3/uL (ref 1.4–7.0)
Neutrophils: 62 %
Platelets: 326 x10E3/uL (ref 150–450)
RBC: 4.59 x10E6/uL (ref 3.77–5.28)
RDW: 13.6 % (ref 12.3–15.4)
WBC: 8.2 x10E3/uL (ref 3.4–10.8)

## 2018-01-04 ENCOUNTER — Telehealth: Payer: Self-pay

## 2018-01-04 NOTE — Telephone Encounter (Signed)
-----   Message from Antony Blackbird, MD sent at 01/03/2018  5:23 PM EDT ----- Please notify patient that her CMP and CBC were normal

## 2018-01-04 NOTE — Telephone Encounter (Signed)
Patient was called, verified DOB, and was given most recent lab results. Patient verbalized understanding and had no further questions.

## 2018-01-05 ENCOUNTER — Other Ambulatory Visit: Payer: Self-pay | Admitting: Family Medicine

## 2018-01-05 DIAGNOSIS — F411 Generalized anxiety disorder: Secondary | ICD-10-CM

## 2018-01-05 MED FILL — ONDANSETRON ODT 8 MG TABLET: 8 | 20 days supply | Qty: 20 | Fill #0

## 2018-01-05 MED FILL — CYCLOBENZAPRINE 10 MG TAB: 10 | 30 days supply | Qty: 30 | Fill #0

## 2018-01-05 MED FILL — ?DULoxetine HCL 20 MG CPEP: 20 | 30 days supply | Qty: 30 | Fill #0

## 2018-01-05 MED FILL — VENLAFAXINE HCL ER 150 MG C: 150 | 30 days supply | Qty: 30 | Fill #0

## 2018-01-05 MED FILL — ?METFORMIN HCL 500MG TABS: 500 | 30 days supply | Qty: 30 | Fill #0

## 2018-01-05 MED FILL — DICYCLOMINE 10 MG CAPSULE: 10 | 15 days supply | Qty: 60 | Fill #0

## 2018-01-05 NOTE — Telephone Encounter (Signed)
Patient refill request -

## 2018-01-06 MED ORDER — CLONAZEPAM 0.5 MG PO TABS: ORAL_TABLET | ORAL | 0 refills | Status: DC

## 2018-01-06 MED FILL — clonazePAM 0.5 MG TABS: 0.5 | 30 days supply | Qty: 20 | Fill #0

## 2018-01-11 ENCOUNTER — Telehealth: Payer: Self-pay

## 2018-01-11 NOTE — Telephone Encounter (Signed)
As per Abelino Derrick, [facility] [place], they have left messages for the patient but she has not returned any calls.

## 2018-01-19 ENCOUNTER — Other Ambulatory Visit: Payer: Self-pay | Admitting: Family Medicine

## 2018-01-19 DIAGNOSIS — Z1231 Encounter for screening mammogram for malignant neoplasm of breast: Secondary | ICD-10-CM

## 2018-02-02 ENCOUNTER — Ambulatory Visit: Payer: No Typology Code available for payment source | Admitting: Podiatry

## 2018-02-02 ENCOUNTER — Encounter: Payer: Self-pay | Admitting: Podiatry

## 2018-02-02 DIAGNOSIS — G5763 Lesion of plantar nerve, bilateral lower limbs: Secondary | ICD-10-CM

## 2018-02-02 DIAGNOSIS — M722 Plantar fascial fibromatosis: Secondary | ICD-10-CM

## 2018-02-02 NOTE — Progress Notes (Signed)
Subjective:  Patient ID: Isabel Evans, female    DOB: 03-30-1971,  MRN: [id number]  Chief Complaint  Patient presents with  . Plantar Fibromatosis    bilateral flare-up; pt stated, "started back hurting in the last 51mo and getting worse; open to more injections"    47 y.o. female presents with the above complaint. States the ball of the feet is hurting and feels like ground glass. States the injections made the fibromas feel better.  Review of Systems: Negative except as noted in the HPI. Denies N/V/F/Ch.  Past Medical History:  Diagnosis Date  . Anemia 11/24/2017  . Anxiety   . Bone spur    cervical spine  . Chronic kidney disease 04/2017   kidney infections  . Depression   . Deviated nasal septum    states is unable to lie flat, because her nose will become congested and she can stop breathing  . Dysrhythmia    heart flutters occasionally  . Fibromyalgia   . GERD (gastroesophageal reflux disease)   . Hallux abductovalgus with bunions 09/2014   right   . Hammertoe 09/2014   right 4th, 5th  . History of stomach ulcers   . Hypertension    states is under control with med., has been on med. x 8 mos.  . IBS (irritable bowel syndrome)    no current med.  . Osteoarthritis    hands, bilateral hips  . Peripheral vascular disease (HCC)    occasional swelling in both legs  . Sinus headache     Current Outpatient Medications:  .  amLODipine (NORVASC) 10 MG tablet, Take 1 tablet (10 mg total) by mouth daily., Disp: 90 tablet, Rfl: 3 .  clonazePAM (KLONOPIN) 0.5 MG tablet, Use once per day if needed for acute anxiety; 30 day supply, Disp: 20 tablet, Rfl: 0 .  cyclobenzaprine (FLEXERIL) 10 MG tablet, Take 1 tablet (10 mg total) by mouth at bedtime. Take as needed for muscle spasms, Disp: 30 tablet, Rfl: 3 .  diclofenac sodium (VOLTAREN) 1 % GEL, Apply 4 g topically 4 (four) times daily., Disp: 1 Tube, Rfl: 2 .  dicyclomine (BENTYL) 10 MG capsule, TAKE 1 CAPSULE BY MOUTH 4 TIMES  DAILY AS NEEDED FOR SPASMS., Disp: 60 capsule, Rfl: 6 .  diphenhydrAMINE (BENADRYL) 25 MG tablet, Take 25 mg by mouth daily as needed for allergies., Disp: , Rfl:  .  DULoxetine (CYMBALTA) 20 MG capsule, Take 1 capsule (20 mg total) by mouth daily., Disp: 30 capsule, Rfl: 3 .  ferrous gluconate (FERGON) 324 MG tablet, Take 1 tablet (324 mg total) by mouth daily with breakfast., Disp: 30 tablet, Rfl: 0 .  fluticasone (FLONASE) 50 MCG/ACT nasal spray, PLACE 2 SPRAYS INTO BOTH NOSTRILS DAILY., Disp: 16 g, Rfl: 3 .  gabapentin (NEURONTIN) 300 MG capsule, One pill twice daily and 2 pills at bedtime, Disp: 120 capsule, Rfl: 6 .  hydrOXYzine (ATARAX/VISTARIL) 25 MG tablet, Take 1 tablet (25 mg total) by mouth 2 (two) times daily as needed for anxiety., Disp: 30 tablet, Rfl: 2 .  meloxicam (MOBIC) 15 MG tablet, Take 1 tablet (15 mg total) by mouth daily., Disp: 30 tablet, Rfl: 2 .  metFORMIN (GLUCOPHAGE) 500 MG tablet, Take 1 tablet (500 mg total) by mouth daily with breakfast., Disp: 30 tablet, Rfl: 6 .  omeprazole (PRILOSEC) 20 MG capsule, Take 2 capsules (40 mg total) by mouth daily., Disp: 180 capsule, Rfl: 3 .  ondansetron (ZOFRAN) 4 MG tablet, Take 1 tablet (4 mg total)  by mouth 2 (two) times daily as needed for nausea or vomiting., Disp: 20 tablet, Rfl: 0 .  ondansetron (ZOFRAN-ODT) 8 MG disintegrating tablet, Take 1 tablet (8 mg total) by mouth daily as needed for nausea or vomiting., Disp: 20 tablet, Rfl: 0 .  pravastatin (PRAVACHOL) 40 MG tablet, Take 1 tablet (40 mg total) by mouth daily., Disp: 90 tablet, Rfl: 3 .  venlafaxine XR (EFFEXOR XR) 150 MG 24 hr capsule, Take 1 capsule (150 mg total) by mouth daily with breakfast., Disp: 30 capsule, Rfl: 4  Social History   Tobacco Use  Smoking Status Former Smoker  . Last attempt to quit: 08/24/2012  . Years since quitting: 5.4  Smokeless Tobacco Never Used    No Known Allergies Objective:  There were no vitals filed for this visit. There is  no height or weight on file to calculate BMI. Constitutional Well developed. Well nourished.  Vascular Dorsalis pedis pulses palpable bilaterally. Posterior tibial pulses palpable bilaterally. Capillary refill normal to all digits.  No cyanosis or clubbing noted. Pedal hair growth normal.  Neurologic Normal speech. Oriented to person, place, and time. Epicritic sensation to light touch grossly present bilaterally.  Dermatologic Nails well groomed and normal in appearance. No open wounds. No skin lesions. Fibrous masses plantar surface of both foot right second, fifth toes, left second, third, fourth toe.  Orthopedic: Normal joint ROM without pain or crepitus bilaterally. No visible deformities. POP 3rd interspace bilat.   Radiographs: None Assessment:   1. Morton's metatarsalgia, neuralgia, or neuroma, bilateral   2. Plantar fibromatosis    Plan:  Patient was evaluated and treated and all questions answered.  Neuromas Bilat -Educated on etiology -Injections as below.  Procedure: Neuroma Injection Location: Bilateral 3rd interspace Skin Prep: Alcohol. Injectate: 0.5 cc 0.5% marcaine plain, 0.5 cc dexamethasone phosphate. Disposition: Patient tolerated procedure well. Injection site dressed with a band-aid.  Return if symptoms worsen or fail to improve.

## 2018-02-03 ENCOUNTER — Other Ambulatory Visit (HOSPITAL_COMMUNITY): Payer: Self-pay | Admitting: *Deleted

## 2018-02-03 DIAGNOSIS — Z1231 Encounter for screening mammogram for malignant neoplasm of breast: Secondary | ICD-10-CM

## 2018-02-09 ENCOUNTER — Other Ambulatory Visit: Payer: Self-pay | Admitting: Family Medicine

## 2018-02-09 DIAGNOSIS — F411 Generalized anxiety disorder: Secondary | ICD-10-CM

## 2018-02-09 MED ORDER — CLONAZEPAM 0.5 MG PO TABS: ORAL_TABLET | ORAL | 0 refills | Status: DC

## 2018-02-09 MED ORDER — ONDANSETRON 8 MG PO TBDP: 8.0000 mg | ORAL_TABLET | Freq: Every day | ORAL | 0 refills | Status: DC | PRN

## 2018-02-09 MED FILL — clonazePAM 0.5 MG TABS: 0.5 | 30 days supply | Qty: 20 | Fill #0

## 2018-02-09 MED FILL — ONDANSETRON ODT 8 MG TABLET: 8 | 7 days supply | Qty: 20 | Fill #0

## 2018-02-09 NOTE — Telephone Encounter (Signed)
Refill request

## 2018-02-13 ENCOUNTER — Encounter: Payer: Self-pay | Admitting: Family Medicine

## 2018-02-13 ENCOUNTER — Ambulatory Visit: Payer: Self-pay | Attending: Family Medicine | Admitting: Family Medicine

## 2018-02-13 ENCOUNTER — Ambulatory Visit (HOSPITAL_COMMUNITY)
Admission: RE | Admit: 2018-02-13 | Discharge: 2018-02-13 | Disposition: A | Discharge status: Home / Self Care | Payer: Self-pay | Source: Ambulatory Visit | Attending: Family Medicine | Admitting: Family Medicine

## 2018-02-13 VITALS — BP 129/83 | HR 85 | Temp 98.1°F | Resp 16 | Ht 71.0 in | Wt 186.0 lb

## 2018-02-13 DIAGNOSIS — Z79899 Other long term (current) drug therapy: Secondary | ICD-10-CM

## 2018-02-13 DIAGNOSIS — Z8719 Personal history of other diseases of the digestive system: Secondary | ICD-10-CM | POA: Insufficient documentation

## 2018-02-13 DIAGNOSIS — M797 Fibromyalgia: Secondary | ICD-10-CM | POA: Insufficient documentation

## 2018-02-13 DIAGNOSIS — K589 Irritable bowel syndrome without diarrhea: Secondary | ICD-10-CM | POA: Insufficient documentation

## 2018-02-13 DIAGNOSIS — S79912A Unspecified injury of left hip, initial encounter: Secondary | ICD-10-CM | POA: Insufficient documentation

## 2018-02-13 DIAGNOSIS — M542 Cervicalgia: Secondary | ICD-10-CM | POA: Insufficient documentation

## 2018-02-13 DIAGNOSIS — M160 Bilateral primary osteoarthritis of hip: Secondary | ICD-10-CM | POA: Insufficient documentation

## 2018-02-13 DIAGNOSIS — F329 Major depressive disorder, single episode, unspecified: Secondary | ICD-10-CM | POA: Insufficient documentation

## 2018-02-13 DIAGNOSIS — I739 Peripheral vascular disease, unspecified: Secondary | ICD-10-CM | POA: Insufficient documentation

## 2018-02-13 DIAGNOSIS — M159 Polyosteoarthritis, unspecified: Secondary | ICD-10-CM

## 2018-02-13 DIAGNOSIS — M79641 Pain in right hand: Secondary | ICD-10-CM | POA: Insufficient documentation

## 2018-02-13 DIAGNOSIS — M5416 Radiculopathy, lumbar region: Secondary | ICD-10-CM | POA: Insufficient documentation

## 2018-02-13 DIAGNOSIS — M79642 Pain in left hand: Secondary | ICD-10-CM | POA: Insufficient documentation

## 2018-02-13 DIAGNOSIS — M79671 Pain in right foot: Secondary | ICD-10-CM | POA: Insufficient documentation

## 2018-02-13 DIAGNOSIS — M79672 Pain in left foot: Secondary | ICD-10-CM | POA: Insufficient documentation

## 2018-02-13 DIAGNOSIS — Z87891 Personal history of nicotine dependence: Secondary | ICD-10-CM | POA: Insufficient documentation

## 2018-02-13 DIAGNOSIS — M5440 Lumbago with sciatica, unspecified side: Secondary | ICD-10-CM

## 2018-02-13 DIAGNOSIS — M25552 Pain in left hip: Secondary | ICD-10-CM | POA: Insufficient documentation

## 2018-02-13 DIAGNOSIS — R7303 Prediabetes: Secondary | ICD-10-CM

## 2018-02-13 DIAGNOSIS — F419 Anxiety disorder, unspecified: Secondary | ICD-10-CM

## 2018-02-13 DIAGNOSIS — M25551 Pain in right hip: Secondary | ICD-10-CM | POA: Insufficient documentation

## 2018-02-13 DIAGNOSIS — N189 Chronic kidney disease, unspecified: Secondary | ICD-10-CM | POA: Insufficient documentation

## 2018-02-13 DIAGNOSIS — G8929 Other chronic pain: Secondary | ICD-10-CM | POA: Insufficient documentation

## 2018-02-13 DIAGNOSIS — K219 Gastro-esophageal reflux disease without esophagitis: Secondary | ICD-10-CM | POA: Insufficient documentation

## 2018-02-13 DIAGNOSIS — E785 Hyperlipidemia, unspecified: Secondary | ICD-10-CM | POA: Insufficient documentation

## 2018-02-13 DIAGNOSIS — M2550 Pain in unspecified joint: Secondary | ICD-10-CM

## 2018-02-13 DIAGNOSIS — Z8249 Family history of ischemic heart disease and other diseases of the circulatory system: Secondary | ICD-10-CM | POA: Insufficient documentation

## 2018-02-13 DIAGNOSIS — M19042 Primary osteoarthritis, left hand: Secondary | ICD-10-CM | POA: Insufficient documentation

## 2018-02-13 DIAGNOSIS — M19041 Primary osteoarthritis, right hand: Secondary | ICD-10-CM | POA: Insufficient documentation

## 2018-02-13 DIAGNOSIS — M545 Low back pain, unspecified: Secondary | ICD-10-CM

## 2018-02-13 DIAGNOSIS — R232 Flushing: Secondary | ICD-10-CM | POA: Insufficient documentation

## 2018-02-13 DIAGNOSIS — I129 Hypertensive chronic kidney disease with stage 1 through stage 4 chronic kidney disease, or unspecified chronic kidney disease: Secondary | ICD-10-CM | POA: Insufficient documentation

## 2018-02-13 LAB — GLUCOSE, POCT (MANUAL RESULT ENTRY): POC Glucose: 112 mg/dL — AB (ref 70–99)

## 2018-02-13 MED ORDER — PREDNISONE 20 MG PO TABS: ORAL_TABLET | ORAL | 1 refills | Status: DC

## 2018-02-13 MED ORDER — IBUPROFEN 600 MG PO TABS: 600.0000 mg | ORAL_TABLET | Freq: Three times a day (TID) | ORAL | 0 refills | Status: DC | PRN

## 2018-02-13 MED ORDER — KETOROLAC TROMETHAMINE 60 MG/2ML IM SOLN
60.0000 mg | Freq: Once | INTRAMUSCULAR | Status: AC
  Administered 2018-02-13: 60 mg via INTRAMUSCULAR

## 2018-02-13 MED FILL — CYCLOBENZAPRINE 10 MG TAB: 10 | 30 days supply | Qty: 30 | Fill #1

## 2018-02-13 MED FILL — ?PRAVASTATIN NA 40 MG TAB: 40 | 30 days supply | Qty: 30 | Fill #3

## 2018-02-13 MED FILL — hydrOXYzine HCL 25 MG TABS: 25 | 15 days supply | Qty: 30 | Fill #2

## 2018-02-13 MED FILL — FLUTICASONE PROP 50 MCG SPR: 50 | 30 days supply | Qty: 16 | Fill #3

## 2018-02-13 MED FILL — predniSONE 20 MG TABS: 20 | 8 days supply | Qty: 8 | Fill #0

## 2018-02-13 MED FILL — DICYCLOMINE 10 MG CAPSULE: 10 | 15 days supply | Qty: 60 | Fill #1

## 2018-02-13 MED FILL — VENLAFAXINE HCL ER 150 MG C: 150 | 30 days supply | Qty: 30 | Fill #1

## 2018-02-13 MED FILL — AMLODIPINE BESYLATE 10 MG T: 10 | 30 days supply | Qty: 30 | Fill #3

## 2018-02-13 MED FILL — OMEPRAZOLE 20 MG CAP: 20 | 30 days supply | Qty: 60 | Fill #3

## 2018-02-13 MED FILL — IBUPROFEN 600 MG TABLET: 600 | 10 days supply | Qty: 30 | Fill #0

## 2018-02-13 MED FILL — GABAPENTIN 300 MG CAPSULE: 300 | 30 days supply | Qty: 120 | Fill #2

## 2018-02-13 MED FILL — ?DULoxetine HCL 20 MG CPEP: 20 | 30 days supply | Qty: 30 | Fill #1

## 2018-02-13 MED FILL — ?METFORMIN HCL 500MG TABL: 500 | 30 days supply | Qty: 30 | Fill #1
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
## 2018-02-13 NOTE — Progress Notes (Signed)
Patient stated her both of hips hurt, pain on the back of her neck, and shoulder blades hurt.

## 2018-02-14 LAB — COMPREHENSIVE METABOLIC PANEL WITH GFR
ALT: 12 IU/L (ref 0–32)
AST: 14 IU/L (ref 0–40)
Albumin/Globulin Ratio: 1.8 (ref 1.2–2.2)
Albumin: 4.5 g/dL (ref 3.5–5.5)
Alkaline Phosphatase: 96 IU/L (ref 39–117)
BUN/Creatinine Ratio: 10 (ref 9–23)
BUN: 10 mg/dL (ref 6–24)
Bilirubin Total: <0.2 mg/dL (ref 0.0–1.2)
CO2: 23 mmol/L (ref 20–29)
Calcium: 9.5 mg/dL (ref 8.7–10.2)
Chloride: 102 mmol/L (ref 96–106)
Creatinine, Ser: 0.97 mg/dL (ref 0.57–1.00)
GFR calc Af Amer: 81 mL/min/1.73
GFR calc non Af Amer: 70 mL/min/1.73
Globulin, Total: 2.5 g/dL (ref 1.5–4.5)
Glucose: 98 mg/dL (ref 65–99)
Potassium: 4.2 mmol/L (ref 3.5–5.2)
Sodium: 141 mmol/L (ref 134–144)
Total Protein: 7 g/dL (ref 6.0–8.5)

## 2018-02-15 ENCOUNTER — Ambulatory Visit (INDEPENDENT_AMBULATORY_CARE_PROVIDER_SITE_OTHER): Payer: Self-pay | Admitting: Licensed Clinical Social Worker

## 2018-02-15 ENCOUNTER — Telehealth: Payer: Self-pay

## 2018-02-15 DIAGNOSIS — F32A Depression, unspecified: Secondary | ICD-10-CM

## 2018-02-15 DIAGNOSIS — F329 Major depressive disorder, single episode, unspecified: Secondary | ICD-10-CM

## 2018-02-15 DIAGNOSIS — F411 Generalized anxiety disorder: Secondary | ICD-10-CM

## 2018-02-15 NOTE — Telephone Encounter (Signed)
-----   Message from Antony Blackbird, MD sent at 02/14/2018  6:42 PM EDT ----- Please let patient know that her CMP was normal

## 2018-02-15 NOTE — Telephone Encounter (Signed)
Patient was called, answered, verified dob and was informed about notes put in per pcp. Patient stated she viewed them on her mychart. Patient wanted to know was there any type of therapy for her back, or medications to help back pain and it from getting worse. Please fu at your earliest convenience.
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
## 2018-02-15 NOTE — Telephone Encounter (Signed)
Let patient know that I believe that a referral was placed for her to see an orthopedic doctor but I can also make a referral for her to start physical therapy if she is interested

## 2018-02-21 ENCOUNTER — Ambulatory Visit (HOSPITAL_COMMUNITY): Payer: Self-pay | Admitting: Licensed Clinical Social Worker

## 2018-02-23 ENCOUNTER — Ambulatory Visit (INDEPENDENT_AMBULATORY_CARE_PROVIDER_SITE_OTHER): Payer: Self-pay | Admitting: Surgery

## 2018-02-23 ENCOUNTER — Encounter (INDEPENDENT_AMBULATORY_CARE_PROVIDER_SITE_OTHER): Payer: Self-pay | Admitting: Surgery

## 2018-02-23 VITALS — BP 122/83 | HR 119 | Temp 98.8°F | Ht 71.0 in | Wt 186.0 lb

## 2018-02-27 ENCOUNTER — Ambulatory Visit: Payer: Self-pay | Admitting: Family Medicine

## 2018-03-02 ENCOUNTER — Encounter (INDEPENDENT_AMBULATORY_CARE_PROVIDER_SITE_OTHER): Payer: Self-pay | Admitting: Surgery

## 2018-03-02 ENCOUNTER — Ambulatory Visit (INDEPENDENT_AMBULATORY_CARE_PROVIDER_SITE_OTHER): Payer: Self-pay | Admitting: Surgery

## 2018-03-02 DIAGNOSIS — M5412 Radiculopathy, cervical region: Secondary | ICD-10-CM

## 2018-03-02 DIAGNOSIS — M5416 Radiculopathy, lumbar region: Secondary | ICD-10-CM

## 2018-03-02 DIAGNOSIS — M542 Cervicalgia: Secondary | ICD-10-CM

## 2018-03-02 DIAGNOSIS — M2550 Pain in unspecified joint: Secondary | ICD-10-CM

## 2018-03-02 NOTE — Progress Notes (Signed)
b

## 2018-03-02 NOTE — Progress Notes (Signed)
Office Visit Note   Patient: Isabel Evans           Date of Birth: 02/01/1971           MRN: [id number] Visit Date: 03/02/2018              Requested by: Antony Blackbird, MD [place], [place] [address] PCP: Antony Blackbird, MD   Assessment & Plan: Visit Diagnoses:  1. Radiculopathy, cervical region   2. Cervicalgia   3. Radiculopathy, lumbar region   4. Polyarthralgia     Plan: With patient's ongoing symptoms that have failed conservative treatment by her primary care physician recommend getting cervical and lumbar MRIs.  Patient also describes having polyarthralgias and has has a family history of rheumatoid arthritis with her grandmother, mother and sister.  Today blood work was drawn to check CBC and arthritis panel.  Follow with Dr. Lorin Mercy in a few weeks for recheck and he will go over MRI results and lab work.  Follow-Up Instructions: Return in about 3 weeks (around 03/23/2018) for With Dr. Lorin Mercy to review cervical and lumbar MRI scans and labs.   Orders:  Orders Placed This Encounter  Procedures  . MR Cervical Spine w/o contrast  . MR Lumbar Spine w/o contrast  . CBC with Differential  . Sed Rate (ESR)  . Rheumatoid factor  . Uric acid  . Antinuclear Antib (ANA)  . CRP High sensitivity   No orders of the defined types were placed in this encounter.     Procedures: No procedures performed   Clinical Data: No additional findings.   Subjective: Chief Complaint  Patient presents with  . Neck - Pain  . Lower Back - Pain    HPI 47 year old white female referred to our office by primary care physician Dr. Chapman Fitch for chronic cervical spine, lumbar spine issues.  Patient states that she had chronic neck pain with radiation into the bilateral scapular area for several years.  She is also had lumbar spine that radiates into the left great and right lower extremities.  Treated by her primary care physician for this has been given multiple medications  including prednisone taper without any relief.  Patient also describes having left hip pain but this is more into the buttock and lateral hip.  No complaints of groin pain. Review of Systems No current cardiac pulmonary GI GU issues  Objective: Vital Signs: LMP 07/04/2017   Physical Exam  Constitutional: She is oriented to person, place, and time. No distress.  HENT:  Head: Normocephalic and atraumatic.  Eyes: Pupils are equal, round, and reactive to light. EOM are normal.  Pulmonary/Chest: No respiratory distress.  Musculoskeletal:  Patient has some decrease in cervical spine range of motion due to discomfort.  Positive bilateral brachial plexus trapezius and scapular border tenderness.  Bowel shoulders some discomfort with impingement testing.  Negative drop arm test.  Negative logroll bilateral hips.  Positive left greater than right straight leg raise.  Bilateral lumbar paraspinal tenderness.  Somewhat tender of the left hip greater trochanter bursa.  Bilateral calves nontender.  No focal motor deficits.  Neurological: She is alert and oriented to person, place, and time.  Skin: Skin is warm and dry.    Ortho Exam  Specialty Comments:  No specialty comments available.  Imaging: No results found.   PMFS History: Patient Active Problem List   Diagnosis Date Noted  . Anemia 11/24/2017  . Pre-diabetes 11/24/2017  . Blurry vision, bilateral 05/18/2017  .  Callus of foot 05/18/2017  . Pain in joint of left hip 02/16/2017  . Bruises easily 11/10/2016  . Gastroesophageal reflux disease without esophagitis 09/22/2016  . Fatigue associated with anemia 09/22/2016  . Flexion contractures 09/22/2016  . Generalized anxiety disorder 01/01/2016  . Dental abscess 01/01/2016  . Healthcare maintenance 05/08/2015  . Adjustment disorder with mixed anxiety and depressed mood 05/08/2015  . Stress at home 10/24/2014  . Irritable bowel syndrome 10/24/2014  . Anxiety state 10/24/2014  . Preop  testing 09/19/2014  . Pap smear for cervical cancer screening 09/19/2014  . Midline low back pain without sciatica 08/12/2014  . Heberden nodes 08/12/2014  . Contracture of joint, hand 08/12/2014  . Essential hypertension 02/11/2014  . Allergy 02/11/2014  . Screening for breast cancer 02/11/2014  . Dupuytren's contracture of both hands 11/15/2013  . Hallux valgus of right foot 11/15/2013  . Back pain 05/21/2013  . UTI (urinary tract infection) 05/21/2013  . Multiple skin nodules 11/27/2012  . Fibromyalgia 09/08/2012  . Osteoarthritis 09/08/2012  . Neck muscle spasm 07/14/2012  . Plantar wart 07/14/2012  . Insomnia 07/14/2012   Past Medical History:  Diagnosis Date  . Anemia 11/24/2017  . Anxiety   . Bone spur    cervical spine  . Chronic kidney disease 04/2017   kidney infections  . Depression   . Deviated nasal septum    states is unable to lie flat, because her nose will become congested and she can stop breathing  . Dysrhythmia    heart flutters occasionally  . Fibromyalgia   . GERD (gastroesophageal reflux disease)   . Hallux abductovalgus with bunions 09/2014   right   . Hammertoe 09/2014   right 4th, 5th  . History of stomach ulcers   . Hypertension    states is under control with med., has been on med. x 8 mos.  . IBS (irritable bowel syndrome)    no current med.  . Osteoarthritis    hands, bilateral hips  . Peripheral vascular disease (HCC)    occasional swelling in both legs  . Sinus headache     Family History  Problem Relation Age of Onset  . Hypertension Mother   . Diabetes Mother   . Heart disease Mother   . Hypertension Father   . Diabetes Father   . Prostate cancer Father   . Heart disease Father   . Alcohol abuse Father   . Breast cancer Sister   . Hypertension Sister   . Diabetes Sister   . Heart disease Maternal Grandmother        Great GM  . Breast cancer Maternal Grandmother   . Bone cancer Maternal Grandmother   . Breast cancer  Maternal Aunt   . Ovarian cancer Maternal Aunt   . Colon cancer Maternal Aunt        great aunt  . Rheum arthritis Sister   . Colon cancer Son     Past Surgical History:  Procedure Laterality Date  . BUNIONECTOMY Right 10/04/2014   Procedure:  Altamese Belcourt, Emmaline Life;  Surgeon: Trula Slade, DPM;  Location: [facility];  Service: Podiatry;  Laterality: Right;  . COLONOSCOPY WITH PROPOFOL  02/13/2013  . CYSTOSCOPY N/A 07/07/2017   Procedure: CYSTOSCOPY;  Surgeon: Aletha Halim, MD;  Location: [facility];  Service: Gynecology;  Laterality: N/A;  . DUPUYTREN / PALMAR FASCIOTOMY Right 07/18/2013   exc. of multiple nodules  . 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  HAMMER TOE SURGERY Right 10/04/2014   Procedure: HAMMER TOE REPAIR 4TH  AND 5TH  RIGHT FOOT  fourth toe fixation with k wire;  Surgeon: Trula Slade, DPM;  Location: [facility];  Service: Podiatry;  Laterality: Right;  . 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 TUBAL LIGATION    . VAGINAL HYSTERECTOMY N/A 07/07/2017   Procedure: HYSTERECTOMY VAGINAL;  Surgeon: Aletha Halim, MD;  Location: [facility];  Service: Gynecology;  Laterality: N/A;   Social History   Occupational History  . Occupation: DRIVER/CSR    Employer: [employer]  Tobacco Use  . Smoking status: Former Smoker    Last attempt to quit: 08/24/2012    Years since quitting: 5.5  . Smokeless tobacco: Never Used  Substance and Sexual Activity  . Alcohol use: Yes    Comment: occasionally  . Drug use: No  . Sexual activity: Yes    Birth control/protection: Surgical

## 2018-03-03 LAB — HIGH SENSITIVITY CRP: hs-CRP: 2.2 mg/L

## 2018-03-03 LAB — ANA: Anti Nuclear Antibody(ANA): NEGATIVE

## 2018-03-03 LAB — SEDIMENTATION RATE: Sed Rate: 9 mm/h (ref 0–20)

## 2018-03-03 LAB — CBC WITH DIFFERENTIAL/PLATELET
Basophils Absolute: 57 cells/uL (ref 0–200)
Basophils Relative: 0.7 %
EOS ABS: 292 {cells}/uL (ref 15–500)
Eosinophils Relative: 3.6 %
HCT: 38.1 % (ref 35.0–45.0)
Hemoglobin: 13 g/dL (ref 11.7–15.5)
Lymphs Abs: 2746 cells/uL (ref 850–3900)
MCH: 29.5 pg (ref 27.0–33.0)
MCHC: 34.1 g/dL (ref 32.0–36.0)
MCV: 86.4 fL (ref 80.0–100.0)
MONOS PCT: 7.2 %
MPV: 11.1 fL (ref 7.5–12.5)
NEUTROS PCT: 54.6 %
Neutro Abs: 4423 cells/uL (ref 1500–7800)
Platelets: 298 10*3/uL (ref 140–400)
RBC: 4.41 10*6/uL (ref 3.80–5.10)
RDW: 12.5 % (ref 11.0–15.0)
TOTAL LYMPHOCYTE: 33.9 %
WBC: 8.1 10*3/uL (ref 3.8–10.8)
WBCMIX: 583 {cells}/uL (ref 200–950)

## 2018-03-03 LAB — RHEUMATOID FACTOR

## 2018-03-03 LAB — URIC ACID: URIC ACID, SERUM: 4.9 mg/dL (ref 2.5–7.0)

## 2018-03-06 ENCOUNTER — Telehealth (INDEPENDENT_AMBULATORY_CARE_PROVIDER_SITE_OTHER): Payer: Self-pay | Admitting: Orthopaedic Surgery

## 2018-03-06 NOTE — Telephone Encounter (Signed)
Called patient left message on voicemail to return call. Patient is having a MRI 03/15/18 and will need an appointment for MRI review

## 2018-03-08 ENCOUNTER — Telehealth: Payer: Self-pay

## 2018-03-08 NOTE — Telephone Encounter (Signed)
As per Isabel Evans, [facility], they scheduled an appointment with the patient and she was a no show.  They have been trying to contact her since with no success

## 2018-03-12 ENCOUNTER — Other Ambulatory Visit: Payer: Self-pay | Admitting: Family Medicine

## 2018-03-12 DIAGNOSIS — F411 Generalized anxiety disorder: Secondary | ICD-10-CM

## 2018-03-12 DIAGNOSIS — M2550 Pain in unspecified joint: Secondary | ICD-10-CM

## 2018-03-13 ENCOUNTER — Other Ambulatory Visit: Payer: Self-pay | Admitting: Family Medicine

## 2018-03-13 DIAGNOSIS — M2550 Pain in unspecified joint: Secondary | ICD-10-CM

## 2018-03-13 MED FILL — IBUPROFEN 600 MG TABLET: 600 | 10 days supply | Qty: 30 | Fill #0

## 2018-03-13 MED FILL — AMLODIPINE BESYLATE 10 MG T: 10 | 30 days supply | Qty: 30 | Fill #4

## 2018-03-13 MED FILL — OMEPRAZOLE 20 MG CAP: 20 | 30 days supply | Qty: 60 | Fill #4

## 2018-03-13 MED FILL — ?METFORMIN HCL 500MG TABS: 500 | 30 days supply | Qty: 30 | Fill #2

## 2018-03-13 MED FILL — ?PRAVASTATIN NA 40 MG TAB: 40 | 30 days supply | Qty: 30 | Fill #4

## 2018-03-13 MED FILL — predniSONE 20 MG TABS: 20 | 8 days supply | Qty: 8 | Fill #1

## 2018-03-13 MED FILL — CYCLOBENZAPRINE 10 MG TAB: 10 | 30 days supply | Qty: 30 | Fill #2

## 2018-03-13 MED FILL — VENLAFAXINE HCL ER 150 MG C: 150 | 30 days supply | Qty: 30 | Fill #2

## 2018-03-15 ENCOUNTER — Ambulatory Visit
Admission: RE | Admit: 2018-03-15 | Discharge: 2018-03-15 | Disposition: A | Discharge status: Home / Self Care | Payer: No Typology Code available for payment source | Source: Ambulatory Visit | Attending: Surgery | Admitting: Surgery

## 2018-03-15 DIAGNOSIS — M542 Cervicalgia: Secondary | ICD-10-CM

## 2018-03-22 ENCOUNTER — Encounter (INDEPENDENT_AMBULATORY_CARE_PROVIDER_SITE_OTHER): Payer: Self-pay | Admitting: Orthopaedic Surgery

## 2018-03-22 ENCOUNTER — Ambulatory Visit (INDEPENDENT_AMBULATORY_CARE_PROVIDER_SITE_OTHER): Payer: Self-pay | Admitting: Orthopaedic Surgery

## 2018-03-22 VITALS — BP 108/75 | HR 76 | Ht 71.0 in | Wt 186.0 lb

## 2018-03-22 DIAGNOSIS — G8929 Other chronic pain: Secondary | ICD-10-CM

## 2018-03-22 DIAGNOSIS — M545 Low back pain, unspecified: Secondary | ICD-10-CM

## 2018-03-22 DIAGNOSIS — M5020 Other cervical disc displacement, unspecified cervical region: Secondary | ICD-10-CM

## 2018-03-22 NOTE — Progress Notes (Signed)
Office Visit Note   Patient: Isabel Evans           Date of Birth: [birth year]           MRN: [id number] Visit Date: 03/22/2018              Requested by: Antony Blackbird, MD [place], [place] [address] PCP: Antony Blackbird, MD   Assessment & Plan: Visit Diagnoses: Neck and low back pain.  Plan: MRI scan images and report as well as lab work reviewed.  She has some mild protrusion C5-6 without cord compression.  No foraminal compression.  Lumbar imaging shows some right L2-3 protrusion and left L5-S1 protrusion without significant compression.  Walking program stretching program using some Tylenol discussed.  She is continuing regular work.  Follow-up if her symptoms progress.  Currently no surgery is indicated.  Follow-Up Instructions: No follow-ups on file.   Orders:  No orders of the defined types were placed in this encounter.  No orders of the defined types were placed in this encounter.     Procedures: No procedures performed   Clinical Data: No additional findings.   Subjective: Chief Complaint  Patient presents with  . MRI Review    L-Spine/C-Spine    HPI 47 year old female returns post MRI scan cervical spine and lumbar spine.  Rheumatologic labs were negative.  Patient states her neck and back symptoms are equal no numbness or tingling in her hands.  She is on ibuprofen 600 mg twice a day.  Occasionally she is taken [place] powder.  We discussed using Tylenol instead of Goody powder since she is already on an anti-inflammatory.  She denies associated bowel or bladder symptoms.  Lumbar spine sometimes worse on the right sometimes worse on the left.  Symptoms tend to come and go.  No chills or fever no bowel bladder symptoms.  Review of Systems updated last visit 03/02/2018 and unchanged other than recent diagnosis with borderline diabetes.   Objective: Vital Signs: BP 108/75   Pulse 76   Ht 5\' 11"  (1.803 m)   Wt 186 lb (84.4 kg)   LMP 07/04/2017    BMI 25.94 kg/m   Physical Exam  Constitutional: She is oriented to person, place, and time. She appears well-developed.  HENT:  Head: Normocephalic.  Right Ear: External ear normal.  Left Ear: External ear normal.  Eyes: Pupils are equal, round, and reactive to light.  Neck: No tracheal deviation present. No thyromegaly present.  Cardiovascular: Normal rate.  Pulmonary/Chest: Effort normal.  Abdominal: Soft.  Neurological: She is alert and oriented to person, place, and time.  Skin: Skin is warm and dry.  Psychiatric: She has a normal mood and affect. Her behavior is normal.    Ortho Exam normal heel toe gait.  Good range of motion of the shoulders.  Some discomfort with cervical rotation flexion extension.  Neurologically intact.  Pulses are palpable no rash over exposed skin no atrophy.  Specialty Comments:  No specialty comments available.  Imaging: CLINICAL DATA:  Lumbar radiculopathy.  EXAM: MRI LUMBAR SPINE WITHOUT CONTRAST  TECHNIQUE: Multiplanar, multisequence MR imaging of the lumbar spine was performed. No intravenous contrast was administered.  COMPARISON:  Lumbar radiographs 02/13/2018. No prior MRI for comparison.  FINDINGS: Segmentation:  Normal  Alignment:  Normal  Vertebrae:  Normal bone marrow.  Negative for fracture or mass.  Conus medullaris and cauda equina: Conus extends to the T12-L1 level. Conus and cauda equina appear normal.  Paraspinal and  other soft tissues: Negative  Disc levels:  L1-2: Negative  L2-3: Right-sided disc protrusion. Subarticular and foraminal stenosis with impingement of the right L2 and L3 nerve roots. Mild spinal stenosis. Mild facet degeneration bilaterally.  L3-4: Mild facet degeneration. Negative for disc protrusion or stenosis  L4-5: Mild disc bulging. Small annular fissure on the right without focal disc protrusion. Bilateral facet hypertrophy. Mild subarticular stenosis  bilaterally  L5-S1: Small left foraminal disc protrusion. Bilateral facet degeneration. Moderate left foraminal encroachment with impingement of the left L5 nerve root.  IMPRESSION: Right foraminal disc protrusion at L2-3 with subarticular foraminal stenosis on the right  Mild subarticular stenosis bilaterally L4-5  Small left foraminal disc protrusion with moderate left foraminal encroachment at L5-S1.   Electronically Signed   By: Franchot Gallo M.D.   On: 03/15/2018 11:56 CLINICAL DATA:  Cervicalgia  EXAM: MRI CERVICAL SPINE WITHOUT CONTRAST  TECHNIQUE: Multiplanar, multisequence MR imaging of the cervical spine was performed. No intravenous contrast was administered.  COMPARISON:  Cervical radiography 02/13/2018. Cervical MRI 04/05/2012  FINDINGS: Alignment: Normal  Vertebrae: No fracture, evidence of discitis, or bone lesion.  Cord: Normal signal and morphology.  Posterior Fossa, vertebral arteries, paraspinal tissues: Negative.  Disc levels:  C4-5 tiny downward pointing central disc protrusion without neural contact, new.  C5-6 shallow central disc protrusion, left eccentric, with slight ventral cord flattening. A herniation was also seen at this level on prior. Patent foramina  IMPRESSION: 1. Small disc herniations at C4-5 and C5-6 with slight ventral cord deformity at C5-6. 2. Patent foramina.   Electronically Signed   By: Monte Fantasia M.D.   On: 03/15/2018 11:25  PMFS History: Patient Active Problem List   Diagnosis Date Noted  . Protrusion of cervical intervertebral disc 03/22/2018  . Anemia 11/24/2017  . Pre-diabetes 11/24/2017  . Blurry vision, bilateral 05/18/2017  . Callus of foot 05/18/2017  . Pain in joint of left hip 02/16/2017  . Bruises easily 11/10/2016  . Gastroesophageal reflux disease without esophagitis 09/22/2016  . Fatigue associated with anemia 09/22/2016  . Flexion contractures 09/22/2016  .  Generalized anxiety disorder 01/01/2016  . Dental abscess 01/01/2016  . Healthcare maintenance 05/08/2015  . Adjustment disorder with mixed anxiety and depressed mood 05/08/2015  . Stress at home 10/24/2014  . Irritable bowel syndrome 10/24/2014  . Anxiety state 10/24/2014  . Preop testing 09/19/2014  . Pap smear for cervical cancer screening 09/19/2014  . Midline low back pain without sciatica 08/12/2014  . Heberden nodes 08/12/2014  . Contracture of joint, hand 08/12/2014  . Essential hypertension 02/11/2014  . Allergy 02/11/2014  . Screening for breast cancer 02/11/2014  . Dupuytren's contracture of both hands 11/15/2013  . Hallux valgus of right foot 11/15/2013  . Back pain 05/21/2013  . UTI (urinary tract infection) 05/21/2013  . Multiple skin nodules 11/27/2012  . Fibromyalgia 09/08/2012  . Osteoarthritis 09/08/2012  . Neck muscle spasm 07/14/2012  . Plantar wart 07/14/2012  . Insomnia 07/14/2012   Past Medical History:  Diagnosis Date  . Anemia 11/24/2017  . Anxiety   . Bone spur    cervical spine  . Chronic kidney disease 04/2017   kidney infections  . Depression   . Deviated nasal septum    states is unable to lie flat, because her nose will become congested and she can stop breathing  . Dysrhythmia    heart flutters occasionally  . Fibromyalgia   . GERD (gastroesophageal reflux disease)   . Hallux abductovalgus with bunions  09/2014   right   . Hammertoe 09/2014   right 4th, 5th  . History of stomach ulcers   . Hypertension    states is under control with med., has been on med. x 8 mos.  . IBS (irritable bowel syndrome)    no current med.  . Osteoarthritis    hands, bilateral hips  . Peripheral vascular disease (HCC)    occasional swelling in both legs  . Sinus headache     Family History  Problem Relation Age of Onset  . Hypertension Mother   . Diabetes Mother   . Heart disease Mother   . Hypertension Father   . Diabetes Father   . Prostate cancer  Father   . Heart disease Father   . Alcohol abuse Father   . Breast cancer Sister   . Hypertension Sister   . Diabetes Sister   . Heart disease Maternal Grandmother        Great GM  . Breast cancer Maternal Grandmother   . Bone cancer Maternal Grandmother   . Breast cancer Maternal Aunt   . Ovarian cancer Maternal Aunt   . Colon cancer Maternal Aunt        great aunt  . Rheum arthritis Sister   . Colon cancer Son     Past Surgical History:  Procedure Laterality Date  . BUNIONECTOMY Right 10/04/2014   Procedure:  Altamese [place], Emmaline Life;  Surgeon: Trula Slade, DPM;  Location: [facility];  Service: Podiatry;  Laterality: Right;  . COLONOSCOPY WITH PROPOFOL  02/13/2013  . CYSTOSCOPY N/A 07/07/2017   Procedure: CYSTOSCOPY;  Surgeon: Aletha Halim, MD;  Location: [facility];  Service: Gynecology;  Laterality: N/A;  . DUPUYTREN / PALMAR FASCIOTOMY Right 07/18/2013   exc. of multiple nodules  . 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 HAMMER TOE SURGERY Right 10/04/2014   Procedure: HAMMER TOE REPAIR 4TH  AND 5TH  RIGHT FOOT  fourth toe fixation with k wire;  Surgeon: Trula Slade, DPM;  Location: [facility];  Service: Podiatry;  Laterality: Right;  . 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 TUBAL LIGATION    . VAGINAL HYSTERECTOMY N/A 07/07/2017   Procedure: HYSTERECTOMY VAGINAL;  Surgeon: Aletha Halim, MD;  Location: [facility];  Service: Gynecology;  Laterality: N/A;   Social History   Occupational History  . Occupation: DRIVER/CSR    Employer: [employer]  Tobacco Use  . Smoking status: Former Smoker    Last attempt to quit: 08/24/2012    Years since quitting: 5.5  . Smokeless tobacco: Never Used  Substance and Sexual Activity  . Alcohol use: Yes    Comment: occasionally  . Drug use: No  . Sexual activity: Yes    Birth control/protection: Surgical

## 2018-03-27 ENCOUNTER — Ambulatory Visit: Payer: Self-pay | Admitting: Family Medicine

## 2018-04-06 ENCOUNTER — Telehealth (INDEPENDENT_AMBULATORY_CARE_PROVIDER_SITE_OTHER): Payer: Self-pay

## 2018-04-06 ENCOUNTER — Encounter (HOSPITAL_COMMUNITY): Payer: Self-pay

## 2018-04-06 ENCOUNTER — Other Ambulatory Visit: Payer: Self-pay

## 2018-04-06 ENCOUNTER — Ambulatory Visit (INDEPENDENT_AMBULATORY_CARE_PROVIDER_SITE_OTHER): Payer: Self-pay | Admitting: Surgery

## 2018-04-06 ENCOUNTER — Encounter (INDEPENDENT_AMBULATORY_CARE_PROVIDER_SITE_OTHER): Payer: Self-pay

## 2018-04-06 ENCOUNTER — Emergency Department (HOSPITAL_COMMUNITY)
Admission: EM | Admit: 2018-04-06 | Discharge: 2018-04-06 | Disposition: A | Discharge status: Home / Self Care | Payer: No Typology Code available for payment source | Attending: Emergency Medicine | Admitting: Emergency Medicine

## 2018-04-06 DIAGNOSIS — M7062 Trochanteric bursitis, left hip: Secondary | ICD-10-CM

## 2018-04-06 DIAGNOSIS — M5442 Lumbago with sciatica, left side: Secondary | ICD-10-CM | POA: Insufficient documentation

## 2018-04-06 DIAGNOSIS — G8929 Other chronic pain: Secondary | ICD-10-CM

## 2018-04-06 DIAGNOSIS — M5432 Sciatica, left side: Secondary | ICD-10-CM

## 2018-04-06 DIAGNOSIS — M5416 Radiculopathy, lumbar region: Secondary | ICD-10-CM

## 2018-04-06 MED ORDER — CYCLOBENZAPRINE HCL 10 MG PO TABS: 10.0000 mg | ORAL_TABLET | Freq: Three times a day (TID) | ORAL | 0 refills | Status: DC | PRN

## 2018-04-06 MED ORDER — TRAMADOL HCL 50 MG PO TABS: 50.0000 mg | ORAL_TABLET | Freq: Four times a day (QID) | ORAL | 0 refills | Status: DC | PRN

## 2018-04-06 MED ORDER — HYDROMORPHONE HCL 1 MG/ML IJ SOLN
1.0000 mg | Freq: Once | INTRAMUSCULAR | Status: AC
  Administered 2018-04-06: 1 mg via INTRAMUSCULAR
  Filled 2018-04-06: qty 1

## 2018-04-06 MED ORDER — DIAZEPAM 5 MG PO TABS
5.0000 mg | ORAL_TABLET | Freq: Once | ORAL | Status: AC
  Administered 2018-04-06: 5 mg via ORAL
  Filled 2018-04-06: qty 1

## 2018-04-06 MED ORDER — PREDNISONE 50 MG PO TABS: 50.0000 mg | ORAL_TABLET | Freq: Every day | ORAL | 0 refills | Status: DC

## 2018-04-06 NOTE — Discharge Instructions (Addendum)
Follow-up with your orthopedist.  Return here as needed.  Use ice and heat on your lower back.

## 2018-04-06 NOTE — ED Notes (Signed)
Pt refusing discharge at this time stating "you can't discharge me if I'm still in pain". PA Lawyer made aware of complaint

## 2018-04-06 NOTE — ED Provider Notes (Addendum)
[facility] EMERGENCY DEPARTMENT Provider Note   CSN: [id number] Arrival date & time: 04/06/18  [place]     History   Chief Complaint Chief Complaint  Patient presents with  . Back Pain    HPI Isabel Evans is a 47 y.o. female.  HPI Patient presents to the emergency department with tonic lower back pain.  The patient had an MRI done at the end of November due to her pain.  The patient states that she was seen by her orthopedist today.  Patient states that certain movements palpation make her pain worse.  She feels like her pain is worse with any movement or palpation.  The patient states that they gave her shots at the orthopedist office without significant relief of her symptoms.  Patient states she can ambulate but has pain with ambulation.  Patient denies fever, nausea, vomiting, incontinence, abdominal pain, weakness, dizziness, numbness or syncope. Past Medical History:  Diagnosis Date  . Anemia 11/24/2017  . Anxiety   . Bone spur    cervical spine  . Chronic kidney disease 04/2017   kidney infections  . Depression   . Deviated nasal septum    states is unable to lie flat, because her nose will become congested and she can stop breathing  . Dysrhythmia    heart flutters occasionally  . Fibromyalgia   . GERD (gastroesophageal reflux disease)   . Hallux abductovalgus with bunions 09/2014   right   . Hammertoe 09/2014   right 4th, 5th  . History of stomach ulcers   . Hypertension    states is under control with med., has been on med. x 8 mos.  . IBS (irritable bowel syndrome)    no current med.  . Osteoarthritis    hands, bilateral hips  . Peripheral vascular disease (HCC)    occasional swelling in both legs  . Sinus headache     Patient Active Problem List   Diagnosis Date Noted  . Protrusion of cervical intervertebral disc 03/22/2018  . Anemia 11/24/2017  . Pre-diabetes 11/24/2017  . Blurry vision, bilateral 05/18/2017  . Callus of foot  05/18/2017  . Pain in joint of left hip 02/16/2017  . Bruises easily 11/10/2016  . Gastroesophageal reflux disease without esophagitis 09/22/2016  . Fatigue associated with anemia 09/22/2016  . Flexion contractures 09/22/2016  . Generalized anxiety disorder 01/01/2016  . Dental abscess 01/01/2016  . Healthcare maintenance 05/08/2015  . Adjustment disorder with mixed anxiety and depressed mood 05/08/2015  . Stress at home 10/24/2014  . Irritable bowel syndrome 10/24/2014  . Anxiety state 10/24/2014  . Preop testing 09/19/2014  . Pap smear for cervical cancer screening 09/19/2014  . Midline low back pain without sciatica 08/12/2014  . Heberden nodes 08/12/2014  . Contracture of joint, hand 08/12/2014  . Essential hypertension 02/11/2014  . Allergy 02/11/2014  . Screening for breast cancer 02/11/2014  . Dupuytren's contracture of both hands 11/15/2013  . Hallux valgus of right foot 11/15/2013  . Back pain 05/21/2013  . UTI (urinary tract infection) 05/21/2013  . Multiple skin nodules 11/27/2012  . Fibromyalgia 09/08/2012  . Osteoarthritis 09/08/2012  . Neck muscle spasm 07/14/2012  . Plantar wart 07/14/2012  . Insomnia 07/14/2012    Past Surgical History:  Procedure Laterality Date  . BUNIONECTOMY Right 10/04/2014   Procedure:  Altamese [place], Emmaline Life;  Surgeon: Trula Slade, DPM;  Location: [facility];  Service: Podiatry;  Laterality: Right;  . COLONOSCOPY WITH PROPOFOL  02/13/2013  . CYSTOSCOPY N/A 07/07/2017   Procedure: CYSTOSCOPY;  Surgeon: Aletha Halim, MD;  Location: [facility];  Service: Gynecology;  Laterality: N/A;  . DUPUYTREN / PALMAR FASCIOTOMY Right 07/18/2013   exc. of multiple nodules  . 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 HAMMER TOE SURGERY Right 10/04/2014   Procedure: HAMMER TOE REPAIR 4TH  AND 5TH  RIGHT FOOT  fourth toe fixation with k wire;  Surgeon: Trula Slade, DPM;  Location: [facility];  Service: Podiatry;  Laterality: Right;  . 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 TUBAL LIGATION    . VAGINAL HYSTERECTOMY N/A 07/07/2017   Procedure: HYSTERECTOMY VAGINAL;  Surgeon: Aletha Halim, MD;  Location: [facility];  Service: Gynecology;  Laterality: N/A;     OB History    Gravida  3   Para  3   Term  3   Preterm  0   AB  0   Living  3     SAB  0   TAB  0   Ectopic  0   Multiple  0   Live Births           Obstetric Comments  SVD x 3         Home Medications    Prior to Admission medications   Medication Sig Start Date End Date Taking? Authorizing Provider  amLODipine (NORVASC) 10 MG tablet Take 1 tablet (10 mg total) by mouth daily. 05/18/17   Tresa Garter, MD  clonazePAM (KLONOPIN) 0.5 MG tablet Use once per day if needed for acute anxiety; 30 day supply 02/09/18   Fulp, Cammie, MD  clonazePAM (KLONOPIN) 0.5 MG tablet Use once per day if needed for acute anxiety; 30 day supply 02/09/18   Fulp, Cammie, MD  cyclobenzaprine (FLEXERIL) 10 MG tablet Take 1 tablet (10 mg total) by mouth at bedtime. Take as needed for muscle spasms 12/26/17   Fulp, Cammie, MD  diclofenac sodium (VOLTAREN) 1 % GEL Apply 4 g topically 4 (four) times daily. 02/09/17   Tresa Garter, MD  dicyclomine (BENTYL) 10 MG capsule TAKE 1 CAPSULE BY MOUTH 4 TIMES DAILY AS NEEDED FOR SPASMS. 12/26/17   Fulp, Cammie, MD  diphenhydrAMINE (BENADRYL) 25 MG tablet Take 25 mg by mouth daily as needed for allergies.    [provider]  DULoxetine (CYMBALTA) 20 MG capsule Take 1 capsule (20 mg total) by mouth daily. 12/26/17   Fulp, Cammie, MD  ferrous gluconate (FERGON) 324 MG tablet Take 1 tablet (324 mg total) by mouth daily with breakfast. 07/08/17   Aletha Halim, MD  fluticasone (FLONASE) 50 MCG/ACT nasal spray PLACE 2 SPRAYS INTO BOTH NOSTRILS DAILY. 07/08/17   Tresa Garter, MD  gabapentin (NEURONTIN) 300 MG capsule One pill  twice daily and 2 pills at bedtime 11/24/17   Fulp, Cammie, MD  hydrOXYzine (ATARAX/VISTARIL) 25 MG tablet Take 1 tablet (25 mg total) by mouth 2 (two) times daily as needed for anxiety. 09/22/17   Ladell Pier, MD  ibuprofen (ADVIL,MOTRIN) 600 MG tablet TAKE 1 TABLET (600 MG TOTAL) BY MOUTH EVERY 8 (EIGHT) HOURS AS NEEDED FOR MODERATE PAIN. TAKE AFTER EATING 03/13/18   Fulp, Cammie, MD  meloxicam (MOBIC) 15 MG tablet Take 1 tablet (15 mg total) by mouth daily. 09/22/17   Ladell Pier, MD  metFORMIN (GLUCOPHAGE) 500 MG tablet Take 1 tablet (500 mg total) by  mouth daily with breakfast. 12/26/17   Fulp, Cammie, MD  omeprazole (PRILOSEC) 20 MG capsule Take 2 capsules (40 mg total) by mouth daily. 10/19/17   Ladell Pier, MD  ondansetron (ZOFRAN) 4 MG tablet Take 1 tablet (4 mg total) by mouth 2 (two) times daily as needed for nausea or vomiting. 09/22/17   Ladell Pier, MD  ondansetron (ZOFRAN-ODT) 8 MG disintegrating tablet Take 1 tablet (8 mg total) by mouth daily as needed for nausea or vomiting. 02/09/18   Fulp, Cammie, MD  pravastatin (PRAVACHOL) 40 MG tablet Take 1 tablet (40 mg total) by mouth daily. 05/18/17   Tresa Garter, MD  predniSONE (DELTASONE) 20 MG tablet Take 2  Pills by mouth daily for 2 days then 1 pill daily for 2 days then 1/2 pill daily for 4 days 02/13/18   Antony Blackbird, MD  venlafaxine XR (EFFEXOR XR) 150 MG 24 hr capsule Take 1 capsule (150 mg total) by mouth daily with breakfast. 12/26/17   Antony Blackbird, MD    Family History Family History  Problem Relation Age of Onset  . Hypertension Mother   . Diabetes Mother   . Heart disease Mother   . Hypertension Father   . Diabetes Father   . Prostate cancer Father   . Heart disease Father   . Alcohol abuse Father   . Breast cancer Sister   . Hypertension Sister   . Diabetes Sister   . Heart disease Maternal Grandmother        Great GM  . Breast cancer Maternal Grandmother   . Bone cancer Maternal  Grandmother   . Breast cancer Maternal Aunt   . Ovarian cancer Maternal Aunt   . Colon cancer Maternal Aunt        great aunt  . Rheum arthritis Sister   . Colon cancer Son     Social History Social History   Tobacco Use  . Smoking status: Former Smoker    Last attempt to quit: 08/24/2012    Years since quitting: 5.6  . Smokeless tobacco: Never Used  Substance Use Topics  . Alcohol use: Yes    Comment: occasionally  . Drug use: No     Allergies   Patient has no known allergies.   Review of Systems Review of Systems All other systems negative except as documented in the HPI. All pertinent positives and negatives as reviewed in the HPI.  Physical Exam Updated Vital Signs BP (!) 161/102 (BP Location: Right Arm)   Pulse 99   Temp 98.2 F (36.8 C) (Oral)   Resp 20   Ht 5\' 9"  (1.753 m)   Wt 84.8 kg   LMP 07/04/2017   SpO2 100%   BMI 27.62 kg/m   Physical Exam Vitals signs and nursing note reviewed.  Constitutional:      General: She is not in acute distress.    Appearance: She is well-developed.  HENT:     Head: Normocephalic and atraumatic.  Eyes:     Pupils: Pupils are equal, round, and reactive to light.  Pulmonary:     Effort: Pulmonary effort is normal.  Skin:    General: Skin is warm and dry.  Neurological:     General: No focal deficit present.     Mental Status: She is alert and oriented to person, place, and time. Mental status is at baseline.     GCS: GCS eye subscore is 4. GCS verbal subscore is 5. GCS motor subscore is  6.     Sensory: Sensation is intact. No sensory deficit.     Motor: No weakness or abnormal muscle tone.     Coordination: Coordination normal.     Gait: Gait normal.     Deep Tendon Reflexes: Reflexes normal.      ED Treatments / Results  Labs (all labs ordered are listed, but only abnormal results are displayed) Labs Reviewed - No data to display  EKG None  Radiology No results found.  Procedures Procedures  (including critical care time)  Medications Ordered in ED Medications  HYDROmorphone (DILAUDID) injection 1 mg (1 mg Intramuscular Given 04/06/18 1947)  diazepam (VALIUM) tablet 5 mg (5 mg Oral Given 04/06/18 1947)     Initial Impression / Assessment and Plan / ED Course  I have reviewed the triage vital signs and the nursing notes.  Pertinent labs & imaging results that were available during my care of the patient were reviewed by me and considered in my medical decision making (see chart for details).    Patient has no neurological deficits noted on exam and she does have normal gait.  Patient is given pain control here in the emergency department and advised she will need to follow-up with her orthopedist as scheduled next week.  I advised the patient to return for any worsening in her conditions.  Patient agrees the plan and all questions were answered.  Final Clinical Impressions(s) / ED Diagnoses   Final diagnoses:  None    ED Discharge Orders    None       Dalia Heading, PA-C 04/10/18 0012    Dalia Heading, PA-C 04/10/18 0013    Dorie Rank, MD 04/10/18 [phone]

## 2018-04-06 NOTE — ED Notes (Signed)
Pt. Continues to call out for nurse or doctor

## 2018-04-06 NOTE — Telephone Encounter (Signed)
Pt was seen today and  Want to know if she can have  Work note  To keep her out of work , is this okay.

## 2018-04-06 NOTE — ED Notes (Signed)
PA Lawyer at bedside.  

## 2018-04-06 NOTE — ED Notes (Signed)
Pt. Called out again stating pain meds did not work and wanted her nurse

## 2018-04-06 NOTE — ED Triage Notes (Signed)
Pt BIB POV for eval of L sided lower back/hip pain radiating down to L thigh and calf. Pt reports pain to lower back x years, states that L hip onset "really bad" on Monday. Pt was seen in ortho office today, given steroid and NSAID injections to hip. PT states injections did not help. Pt has scheduled follow up w/ MD Lorin [facility] next week but stated she was unable to wait.

## 2018-04-06 NOTE — Progress Notes (Signed)
Office Visit Note   Patient: Isabel Evans           Date of Birth: 1970/12/14           MRN: [id number] Visit Date: 04/06/2018              Requested by: Antony Blackbird, MD [place], [place] [address] PCP: Antony Blackbird, MD   Assessment & Plan: Visit Diagnoses:  1. Radiculopathy, lumbar region   2. Greater trochanteric bursitis, left     Plan: In hopes of giving patient some relief of her acute on chronic pain I elected to do Depo-Medrol 80 mg and Toradol 30 mg IM injections.  Patient follow-up Dr. Lorin Mercy in 1 week for recheck.  He can decide at that time if patient needs possible lumbar ESI's versus surgical intervention for her lumbar spine.  I think majority of patient's acute pain over the last couple days is related to left hip greater trochanteric bursitis.  I did discuss that another option would be for Dr. Lorin Mercy to perform a greater trochanter bursa Marcaine/Depo-Medrol injection next office visit if that continues to be the primary problem.  Follow-Up Instructions: Return in about 8 days (around 04/14/2018) for With Dr. Lorin Mercy for recheck left lumbar radiculopathy and trochanteric bursitis.   Orders:  No orders of the defined types were placed in this encounter.  No orders of the defined types were placed in this encounter.     Procedures: No procedures performed   Clinical Data: No additional findings.   Subjective: Chief Complaint  Patient presents with  . Lower Back - Pain    HPI 47 year old white female returns with complaints of worsening low back pain, left leg pain and left lateral hip pain.  She did have lumbar MRI scan and Dr. Lorin Mercy is already reviewed that with her.  States her last couple days she has had increased pain in the left lateral hip extending down to about the level of her knee.  She continues to have ongoing back pain as well.  Patient states that she was told that she had left hip bone-on-bone changes.  I did review the x-ray  and this is not the case. Review of Systems No current cardiac pulmonary GI GU issues  Objective: Vital Signs: LMP 07/04/2017   Physical Exam HENT:     Head: Normocephalic and atraumatic.     Nose: Nose normal.  Eyes:     Pupils: Pupils are equal, round, and reactive to light.  Pulmonary:     Effort: Pulmonary effort is normal.  Musculoskeletal:     Comments: Patient is exquisitely tender over the left hip greater trochanter.  Neurologically intact.  Skin:    General: Skin is warm and dry.  Neurological:     General: No focal deficit present.     Mental Status: She is alert and oriented to person, place, and time.  Psychiatric:        Mood and Affect: Mood normal.     Ortho Exam  Specialty Comments:  No specialty comments available.  Imaging: No results found.   PMFS History: Patient Active Problem List   Diagnosis Date Noted  . Protrusion of cervical intervertebral disc 03/22/2018  . Anemia 11/24/2017  . Pre-diabetes 11/24/2017  . Blurry vision, bilateral 05/18/2017  . Callus of foot 05/18/2017  . Pain in joint of left hip 02/16/2017  . Bruises easily 11/10/2016  . Gastroesophageal reflux disease without esophagitis 09/22/2016  . Fatigue associated with  anemia 09/22/2016  . Flexion contractures 09/22/2016  . Generalized anxiety disorder 01/01/2016  . Dental abscess 01/01/2016  . Healthcare maintenance 05/08/2015  . Adjustment disorder with mixed anxiety and depressed mood 05/08/2015  . Stress at home 10/24/2014  . Irritable bowel syndrome 10/24/2014  . Anxiety state 10/24/2014  . Preop testing 09/19/2014  . Pap smear for cervical cancer screening 09/19/2014  . Midline low back pain without sciatica 08/12/2014  . [place] nodes 08/12/2014  . Contracture of joint, hand 08/12/2014  . Essential hypertension 02/11/2014  . Allergy 02/11/2014  . Screening for breast cancer 02/11/2014  . Dupuytren's contracture of both hands 11/15/2013  . Hallux valgus of  right foot 11/15/2013  . Back pain 05/21/2013  . UTI (urinary tract infection) 05/21/2013  . Multiple skin nodules 11/27/2012  . Fibromyalgia 09/08/2012  . Osteoarthritis 09/08/2012  . Neck muscle spasm 07/14/2012  . Plantar wart 07/14/2012  . Insomnia 07/14/2012   Past Medical History:  Diagnosis Date  . Anemia 11/24/2017  . Anxiety   . Bone spur    cervical spine  . Chronic kidney disease 04/2017   kidney infections  . Depression   . Deviated nasal septum    states is unable to lie flat, because her nose will become congested and she can stop breathing  . Dysrhythmia    heart flutters occasionally  . Fibromyalgia   . GERD (gastroesophageal reflux disease)   . Hallux abductovalgus with bunions 09/2014   right   . Hammertoe 09/2014   right 4th, 5th  . History of stomach ulcers   . Hypertension    states is under control with med., has been on med. x 8 mos.  . IBS (irritable bowel syndrome)    no current med.  . Osteoarthritis    hands, bilateral hips  . Peripheral vascular disease (HCC)    occasional swelling in both legs  . Sinus headache     Family History  Problem Relation Age of Onset  . Hypertension Mother   . Diabetes Mother   . Heart disease Mother   . Hypertension Father   . Diabetes Father   . Prostate cancer Father   . Heart disease Father   . Alcohol abuse Father   . Breast cancer Sister   . Hypertension Sister   . Diabetes Sister   . Heart disease Maternal Grandmother        Great GM  . Breast cancer Maternal Grandmother   . Bone cancer Maternal Grandmother   . Breast cancer Maternal Aunt   . Ovarian cancer Maternal Aunt   . Colon cancer Maternal Aunt        great aunt  . Rheum arthritis Sister   . Colon cancer Son     Past Surgical History:  Procedure Laterality Date  . BUNIONECTOMY Right 10/04/2014   Procedure:  [place], [place]aline Life;  Surgeon: Trula Slade, DPM;  Location: [facility];  Service:  Podiatry;  Laterality: Right;  . COLONOSCOPY WITH PROPOFOL  02/13/2013  . CYSTOSCOPY N/A 07/07/2017   Procedure: CYSTOSCOPY;  Surgeon: Aletha Halim, MD;  Location: [facility];  Service: Gynecology;  Laterality: N/A;  . DUPUYTREN / PALMAR FASCIOTOMY Right 07/18/2013   exc. of multiple nodules  . 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 HAMMER TOE SURGERY Right 10/04/2014   Procedure: HAMMER TOE REPAIR 4TH  AND 5TH  RIGHT FOOT  fourth toe fixation with k wire;  Surgeon: Bonna Gains  Jacqualyn Posey, DPM;  Location: [facility];  Service: Podiatry;  Laterality: Right;  . 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 TUBAL LIGATION    . VAGINAL HYSTERECTOMY N/A 07/07/2017   Procedure: HYSTERECTOMY VAGINAL;  Surgeon: Aletha Halim, MD;  Location: [facility];  Service: Gynecology;  Laterality: N/A;   Social History   Occupational History  . Occupation: DRIVER/CSR    Employer: [employer]  Tobacco Use  . Smoking status: Former Smoker    Last attempt to quit: 08/24/2012    Years since quitting: 5.6  . Smokeless tobacco: Never Used  Substance and Sexual Activity  . Alcohol use: Yes    Comment: occasionally  . Drug use: No  . Sexual activity: Yes    Birth control/protection: Surgical

## 2018-04-07 ENCOUNTER — Telehealth (INDEPENDENT_AMBULATORY_CARE_PROVIDER_SITE_OTHER): Payer: Self-pay

## 2018-04-07 ENCOUNTER — Telehealth (INDEPENDENT_AMBULATORY_CARE_PROVIDER_SITE_OTHER): Payer: Self-pay | Admitting: Radiology

## 2018-04-07 MED FILL — traMADol HCL 50 MG TABS: 50 | 5 days supply | Qty: 20 | Fill #0

## 2018-04-07 MED FILL — CYCLOBENZAPRINE 10 MG TAB: 10 | 5 days supply | Qty: 15 | Fill #0

## 2018-04-07 MED FILL — predniSONE 10 MG TABS: 10 | 5 days supply | Qty: 25 | Fill #0

## 2018-04-07 NOTE — Telephone Encounter (Signed)
Please advise. Patient called and wanted to be worked in for an appointment this afternoon. She saw Benjiman Core, PA-C yesterday in the clinic. She was seen at the ED overnight for pain and was prescribed Tramadol, Flexeril, and Prednisone 50mg .

## 2018-04-07 NOTE — Telephone Encounter (Signed)
Per Dr. Ernestina Patches Lt L5-S1 IL has been scheduled for 04/28/18 with driver.

## 2018-04-07 NOTE — Telephone Encounter (Signed)
Set up for ESI with [facility] left L5-S1 times 1 . Does not need OV today thanks. She can return a couple weeks after ESI thanks

## 2018-04-07 NOTE — Telephone Encounter (Signed)
Patient called stating that she is still in a lot of pain.  Patient was seen on Thursday , 04/06/2018 by Benjiman Core and went to the ED on 04/06/2018. Not able to apply pressure on left side.  CB# is [phone].  Please advise.  Thank you.

## 2018-04-07 NOTE — Telephone Encounter (Signed)
I called and spoke with patient. She has been scheduled with Dr. Ernestina Patches on January 10.

## 2018-04-07 NOTE — Telephone Encounter (Signed)
Patient calling to set up appointment with Dr. Ernestina Patches for injection.   Call [phone] # [phone]

## 2018-04-11 ENCOUNTER — Other Ambulatory Visit: Payer: Self-pay | Admitting: Family Medicine

## 2018-04-11 DIAGNOSIS — M2550 Pain in unspecified joint: Secondary | ICD-10-CM

## 2018-04-11 DIAGNOSIS — F411 Generalized anxiety disorder: Secondary | ICD-10-CM

## 2018-04-13 ENCOUNTER — Ambulatory Visit: Payer: Self-pay | Attending: Family Medicine | Admitting: Family Medicine

## 2018-04-13 ENCOUNTER — Encounter: Payer: Self-pay | Admitting: Family Medicine

## 2018-04-13 VITALS — BP 119/75 | HR 85 | Temp 98.5°F | Resp 18 | Ht 67.0 in | Wt 188.0 lb

## 2018-04-13 DIAGNOSIS — Z8041 Family history of malignant neoplasm of ovary: Secondary | ICD-10-CM | POA: Insufficient documentation

## 2018-04-13 DIAGNOSIS — M51369 Other intervertebral disc degeneration, lumbar region without mention of lumbar back pain or lower extremity pain: Secondary | ICD-10-CM

## 2018-04-13 DIAGNOSIS — Z833 Family history of diabetes mellitus: Secondary | ICD-10-CM | POA: Insufficient documentation

## 2018-04-13 DIAGNOSIS — M48061 Spinal stenosis, lumbar region without neurogenic claudication: Secondary | ICD-10-CM | POA: Insufficient documentation

## 2018-04-13 DIAGNOSIS — M62830 Muscle spasm of back: Secondary | ICD-10-CM

## 2018-04-13 DIAGNOSIS — R7303 Prediabetes: Secondary | ICD-10-CM

## 2018-04-13 DIAGNOSIS — R110 Nausea: Secondary | ICD-10-CM

## 2018-04-13 DIAGNOSIS — Z90710 Acquired absence of both cervix and uterus: Secondary | ICD-10-CM | POA: Insufficient documentation

## 2018-04-13 DIAGNOSIS — I129 Hypertensive chronic kidney disease with stage 1 through stage 4 chronic kidney disease, or unspecified chronic kidney disease: Secondary | ICD-10-CM | POA: Insufficient documentation

## 2018-04-13 DIAGNOSIS — M5442 Lumbago with sciatica, left side: Secondary | ICD-10-CM

## 2018-04-13 DIAGNOSIS — F064 Anxiety disorder due to known physiological condition: Secondary | ICD-10-CM | POA: Insufficient documentation

## 2018-04-13 DIAGNOSIS — N189 Chronic kidney disease, unspecified: Secondary | ICD-10-CM | POA: Insufficient documentation

## 2018-04-13 DIAGNOSIS — M5116 Intervertebral disc disorders with radiculopathy, lumbar region: Secondary | ICD-10-CM | POA: Insufficient documentation

## 2018-04-13 DIAGNOSIS — M5117 Intervertebral disc disorders with radiculopathy, lumbosacral region: Secondary | ICD-10-CM | POA: Insufficient documentation

## 2018-04-13 DIAGNOSIS — M25552 Pain in left hip: Secondary | ICD-10-CM

## 2018-04-13 DIAGNOSIS — M5136 Other intervertebral disc degeneration, lumbar region: Secondary | ICD-10-CM

## 2018-04-13 DIAGNOSIS — Z800 Family history of malignant neoplasm of digestive organs: Secondary | ICD-10-CM | POA: Insufficient documentation

## 2018-04-13 DIAGNOSIS — G8929 Other chronic pain: Secondary | ICD-10-CM

## 2018-04-13 DIAGNOSIS — I739 Peripheral vascular disease, unspecified: Secondary | ICD-10-CM | POA: Insufficient documentation

## 2018-04-13 DIAGNOSIS — Z803 Family history of malignant neoplasm of breast: Secondary | ICD-10-CM | POA: Insufficient documentation

## 2018-04-13 DIAGNOSIS — Z8261 Family history of arthritis: Secondary | ICD-10-CM | POA: Insufficient documentation

## 2018-04-13 DIAGNOSIS — M545 Low back pain, unspecified: Secondary | ICD-10-CM

## 2018-04-13 DIAGNOSIS — F411 Generalized anxiety disorder: Secondary | ICD-10-CM

## 2018-04-13 DIAGNOSIS — Z8249 Family history of ischemic heart disease and other diseases of the circulatory system: Secondary | ICD-10-CM | POA: Insufficient documentation

## 2018-04-13 DIAGNOSIS — Z87891 Personal history of nicotine dependence: Secondary | ICD-10-CM | POA: Insufficient documentation

## 2018-04-13 DIAGNOSIS — M5440 Lumbago with sciatica, unspecified side: Secondary | ICD-10-CM

## 2018-04-13 LAB — POCT GLYCOSYLATED HEMOGLOBIN (HGB A1C): Hemoglobin A1C: 5.9 % — AB (ref 4.0–5.6)

## 2018-04-13 MED ORDER — ONDANSETRON 8 MG PO TBDP: 8.0000 mg | ORAL_TABLET | Freq: Every day | ORAL | 0 refills | Status: DC | PRN

## 2018-04-13 MED ORDER — CLONAZEPAM 0.5 MG PO TABS: ORAL_TABLET | ORAL | 0 refills | Status: DC

## 2018-04-13 MED ORDER — TRAMADOL HCL 50 MG PO TABS: 50.0000 mg | ORAL_TABLET | Freq: Three times a day (TID) | ORAL | 0 refills | Status: DC | PRN

## 2018-04-13 MED ORDER — CYCLOBENZAPRINE HCL 10 MG PO TABS: 10.0000 mg | ORAL_TABLET | Freq: Three times a day (TID) | ORAL | 1 refills | Status: DC | PRN

## 2018-04-13 MED FILL — ONDANSETRON ODT 8 MG TABLET: 8 | 20 days supply | Qty: 20 | Fill #0

## 2018-04-13 MED FILL — CYCLOBENZAPRINE 10 MG TAB: 10 | 20 days supply | Qty: 60 | Fill #0

## 2018-04-13 MED FILL — traMADol HCL 50 MG TABS: 50 | 30 days supply | Qty: 90 | Fill #0

## 2018-04-13 MED FILL — clonazePAM 0.5 MG TABS: 0.5 | 30 days supply | Qty: 20 | Fill #0
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
## 2018-04-14 ENCOUNTER — Ambulatory Visit (INDEPENDENT_AMBULATORY_CARE_PROVIDER_SITE_OTHER): Payer: Self-pay | Admitting: Orthopaedic Surgery

## 2018-04-14 NOTE — Progress Notes (Signed)
Patient was made aware of this result prior to leaving the office.

## 2018-04-19 HISTORY — PX: BREAST BIOPSY: SHX20

## 2018-04-20 ENCOUNTER — Other Ambulatory Visit: Payer: Self-pay | Admitting: Family Medicine

## 2018-04-20 ENCOUNTER — Ambulatory Visit
Admission: RE | Admit: 2018-04-20 | Discharge: 2018-04-20 | Disposition: A | Discharge status: Home / Self Care | Payer: No Typology Code available for payment source | Source: Ambulatory Visit | Attending: Obstetrics and Gynecology | Admitting: Obstetrics and Gynecology

## 2018-04-20 ENCOUNTER — Ambulatory Visit (HOSPITAL_COMMUNITY)
Admission: RE | Admit: 2018-04-20 | Discharge: 2018-04-20 | Disposition: A | Discharge status: Home / Self Care | Payer: No Typology Code available for payment source | Source: Ambulatory Visit | Attending: Obstetrics and Gynecology | Admitting: Obstetrics and Gynecology

## 2018-04-20 ENCOUNTER — Encounter (HOSPITAL_COMMUNITY): Payer: Self-pay

## 2018-04-20 VITALS — BP 120/76 | Wt 189.0 lb

## 2018-04-20 DIAGNOSIS — Z1239 Encounter for other screening for malignant neoplasm of breast: Secondary | ICD-10-CM

## 2018-04-20 DIAGNOSIS — Z1231 Encounter for screening mammogram for malignant neoplasm of breast: Secondary | ICD-10-CM

## 2018-04-20 DIAGNOSIS — M797 Fibromyalgia: Secondary | ICD-10-CM

## 2018-04-20 MED FILL — ?DULoxetine HCL 20 MG CPEP: 20 | 30 days supply | Qty: 30 | Fill #2

## 2018-04-20 MED FILL — VENLAFAXINE HCL ER 150 MG C: 150 | 30 days supply | Qty: 30 | Fill #3

## 2018-04-20 MED FILL — GABAPENTIN 300 MG CAPSULE: 300 | 30 days supply | Qty: 120 | Fill #3

## 2018-04-20 MED FILL — ?PRAVASTATIN NA 40 MG TAB: 40 | 30 days supply | Qty: 30 | Fill #5

## 2018-04-20 MED FILL — AMLODIPINE BESYLATE 10 MG T: 10 | 30 days supply | Qty: 30 | Fill #5

## 2018-04-20 MED FILL — OMEPRAZOLE 20 MG CAP: 20 | 30 days supply | Qty: 60 | Fill #5

## 2018-04-20 NOTE — Patient Instructions (Signed)
Explained breast self awareness with Corrin Parker. Patient did not need a Pap smear today due to last Pap smear and HPV typing was 09/22/2016 and patient has a history of a hysterectomy for benign reasons. Let patient know that she doesn't need any further Pap smears due to her history of a hysterectomy for benign reasons. Referred patient to the [facility] for a screening mammogram. Appointment scheduled for Thursday, April 20, 2018 at 1620. Patient aware of appointment and will be there. Let patient know the [facility] will follow up with her within the next couple weeks with results of mammogram by letter or phone. Anora Schwenke verbalized understanding.  Hasten Sweitzer, Arvil Chaco, RN 3:36 PM

## 2018-04-20 NOTE — Progress Notes (Signed)
No complaints today.   Pap Smear: Pap smear not completed today. Last Pap smear was 09/22/2016 at [facility] and normal with negative HPV. Per patient has a history of an abnormal Pap smear around 10 years ago that a repeat Pap smear was completed for follow-up that was normal. Patient has a history of a hysterectomy in May 2019 due to AUB and fibroids. Patient no longer needs Pap smears due to her history of a hysterectomy for benign reasons per BCCCP and ACOG guidelines. Last Pap smear result is in Epic.  Physical exam: Breasts Breasts symmetrical. No skin abnormalities bilateral breasts. No nipple retraction bilateral breasts. No nipple discharge bilateral breasts. No lymphadenopathy. No lumps palpated bilateral breasts. No complaints of pain or tenderness on exam. Referred patient to the [facility] for a screening mammogram. Appointment scheduled for Thursday, April 20, 2018 at 1620.        Pelvic/Bimanual No Pap smear completed today since last Pap smear and HPV typing was 09/22/2016 and patient has a history of a hysterectomy for benign reasons. Pap smear not indicated per BCCCP guidelines.   Smoking History: Patient is a former smoker that quit in 2014.  Patient Navigation: Patient education provided. Access to services provided for patient through [facility] program.   Colorectal Cancer Screening: Patient had a colonoscopy completed 02/13/2013. No complaints today.   Breast and Cervical Cancer Risk Assessment: Patient has a family history of her maternal grandmother and a maternal aunt having breast cancer. Patient has no known genetic mutations or history of radiation treatment to the chest before age 60. Patient has no history of cervical dysplasia, immunocompromised, or DES exposure in-utero.  Risk Assessment    Risk Scores      04/20/2018   Last edited by: Armond Hang, LPN   5-year risk: 0.8 %   Lifetime risk: 7.6 %

## 2018-04-21 ENCOUNTER — Other Ambulatory Visit: Payer: Self-pay | Admitting: Obstetrics and Gynecology

## 2018-04-21 DIAGNOSIS — R928 Other abnormal and inconclusive findings on diagnostic imaging of breast: Secondary | ICD-10-CM

## 2018-04-24 ENCOUNTER — Encounter (HOSPITAL_COMMUNITY): Payer: Self-pay | Admitting: *Deleted

## 2018-04-24 ENCOUNTER — Encounter (INDEPENDENT_AMBULATORY_CARE_PROVIDER_SITE_OTHER): Payer: Self-pay | Admitting: Physical Medicine and Rehabilitation

## 2018-04-24 ENCOUNTER — Ambulatory Visit (INDEPENDENT_AMBULATORY_CARE_PROVIDER_SITE_OTHER): Payer: Self-pay | Admitting: Physical Medicine and Rehabilitation

## 2018-04-24 ENCOUNTER — Ambulatory Visit (INDEPENDENT_AMBULATORY_CARE_PROVIDER_SITE_OTHER): Payer: Self-pay

## 2018-04-24 ENCOUNTER — Encounter

## 2018-04-24 VITALS — BP 110/77 | HR 73 | Temp 98.1°F

## 2018-04-24 DIAGNOSIS — M5416 Radiculopathy, lumbar region: Secondary | ICD-10-CM

## 2018-04-24 MED ORDER — METHYLPREDNISOLONE ACETATE 80 MG/ML IJ SUSP
80.0000 mg | Freq: Once | INTRAMUSCULAR | Status: AC
  Administered 2018-04-24: 80 mg

## 2018-04-24 NOTE — Patient Instructions (Signed)

## 2018-04-24 NOTE — Procedures (Signed)
Lumbar Epidural Steroid Injection - Interlaminar Approach with Fluoroscopic Guidance  Patient: Isabel Evans      Date of Birth: January 11, 1971 MRN: [id number] PCP: Antony Blackbird, MD      Visit Date: 04/24/2018   Universal Protocol:     Consent Given By: the patient  Position: PRONE  Additional Comments: Vital signs were monitored before and after the procedure. Patient was prepped and draped in the usual sterile fashion. The correct patient, procedure, and site was verified.   Injection Procedure Details:  Procedure Site One Meds Administered:  Meds ordered this encounter  Medications  . methylPREDNISolone acetate (DEPO-MEDROL) injection 80 mg     Laterality: Left  Location/Site:  L5-S1  Needle size: 20 G  Needle type: Tuohy  Needle Placement: Paramedian epidural  Findings:   -Comments: Excellent flow of contrast into the epidural space.  Procedure Details: Using a paramedian approach from the side mentioned above, the region overlying the inferior lamina was localized under fluoroscopic visualization and the soft tissues overlying this structure were infiltrated with 4 ml. of 1% Lidocaine without Epinephrine. The Tuohy needle was inserted into the epidural space using a paramedian approach.   The epidural space was localized using loss of resistance along with lateral and bi-planar fluoroscopic views.  After negative aspirate for air, blood, and CSF, a 2 ml. volume of Isovue-250 was injected into the epidural space and the flow of contrast was observed. Radiographs were obtained for documentation purposes.    The injectate was administered into the level noted above.   Additional Comments:  The patient tolerated the procedure well Dressing: Band-Aid    Post-procedure details: Patient was observed during the procedure. Post-procedure instructions were reviewed.  Patient left the clinic in stable condition.

## 2018-04-24 NOTE — Progress Notes (Signed)
 .  Numeric Pain Rating Scale and Functional Assessment Average Pain 6   In the last MONTH (on 0-10 scale) has pain interfered with the following?  1. General activity like being  able to carry out your everyday physical activities such as walking, climbing stairs, carrying groceries, or moving a chair?  Rating(4)   +Driver, -BT, -Dye Allergies.  

## 2018-04-24 NOTE — Progress Notes (Signed)
   Isabel Evans - 48 y.o. female MRN [id number]  Date of birth: April 14, 1971  Office Visit Note: Visit Date: 04/24/2018 PCP: Antony Blackbird, MD Referred by: Antony Blackbird, MD  Subjective: Chief Complaint  Patient presents with  . Lower Back - Pain  . Left Leg - Pain   HPI:  Isabel Evans is a 48 y.o. female who comes in today For planned left L5-S1 interlaminar steroid injection as requested by Dr. Rodell Perna.  Patient is failed conservative care and does have left radicular leg pain with small disc protrusion.  ROS Otherwise per HPI.  Assessment & Plan: Visit Diagnoses:  1. Lumbar radiculopathy     Plan: No additional findings.   Meds & Orders:  Meds ordered this encounter  Medications  . methylPREDNISolone acetate (DEPO-MEDROL) injection 80 mg    Orders Placed This Encounter  Procedures  . XR C-ARM NO REPORT  . Epidural Steroid injection    Follow-up: Return if symptoms worsen or fail to improve.   Procedures: No procedures performed  Lumbar Epidural Steroid Injection - Interlaminar Approach with Fluoroscopic Guidance  Patient: Isabel Evans      Date of Birth: 01/17/1971 MRN: [id number] PCP: Antony Blackbird, MD      Visit Date: 04/24/2018   Universal Protocol:     Consent Given By: the patient  Position: PRONE  Additional Comments: Vital signs were monitored before and after the procedure. Patient was prepped and draped in the usual sterile fashion. The correct patient, procedure, and site was verified.   Injection Procedure Details:  Procedure Site One Meds Administered:  Meds ordered this encounter  Medications  . methylPREDNISolone acetate (DEPO-MEDROL) injection 80 mg     Laterality: Left  Location/Site:  L5-S1  Needle size: 20 G  Needle type: Tuohy  Needle Placement: Paramedian epidural  Findings:   -Comments: Excellent flow of contrast into the epidural space.  Procedure Details: Using a paramedian approach from the side  mentioned above, the region overlying the inferior lamina was localized under fluoroscopic visualization and the soft tissues overlying this structure were infiltrated with 4 ml. of 1% Lidocaine without Epinephrine. The Tuohy needle was inserted into the epidural space using a paramedian approach.   The epidural space was localized using loss of resistance along with lateral and bi-planar fluoroscopic views.  After negative aspirate for air, blood, and CSF, a 2 ml. volume of Isovue-250 was injected into the epidural space and the flow of contrast was observed. Radiographs were obtained for documentation purposes.    The injectate was administered into the level noted above.   Additional Comments:  The patient tolerated the procedure well Dressing: Band-Aid    Post-procedure details: Patient was observed during the procedure. Post-procedure instructions were reviewed.  Patient left the clinic in stable condition.   Clinical History: No specialty comments available.     Objective:  VS:  HT:    WT:   BMI:     BP:110/77  HR:73bpm  TEMP:98.1 F (36.7 C)(Oral)  RESP:  Physical Exam  Ortho Exam Imaging: Xr C-arm No Report  Result Date: 04/24/2018 Please see Notes tab for imaging impression.

## 2018-04-26 ENCOUNTER — Ambulatory Visit
Admission: RE | Admit: 2018-04-26 | Discharge: 2018-04-26 | Disposition: A | Discharge status: Home / Self Care | Payer: No Typology Code available for payment source | Source: Ambulatory Visit | Attending: Obstetrics and Gynecology | Admitting: Obstetrics and Gynecology

## 2018-04-26 ENCOUNTER — Telehealth: Payer: Self-pay

## 2018-04-26 ENCOUNTER — Other Ambulatory Visit: Payer: Self-pay | Admitting: Obstetrics and Gynecology

## 2018-04-26 DIAGNOSIS — R928 Other abnormal and inconclusive findings on diagnostic imaging of breast: Secondary | ICD-10-CM

## 2018-04-26 DIAGNOSIS — N6320 Unspecified lump in the left breast, unspecified quadrant: Secondary | ICD-10-CM

## 2018-04-26 NOTE — Telephone Encounter (Signed)
As per Isabel Evans, [employer] [place], they have not been able to reach the patient yet and will issue her a 10 day letter to respond before closing the case

## 2018-04-28 ENCOUNTER — Ambulatory Visit
Admission: RE | Admit: 2018-04-28 | Discharge: 2018-04-28 | Disposition: A | Discharge status: Home / Self Care | Payer: No Typology Code available for payment source | Source: Ambulatory Visit | Attending: Obstetrics and Gynecology | Admitting: Obstetrics and Gynecology

## 2018-04-28 ENCOUNTER — Encounter (INDEPENDENT_AMBULATORY_CARE_PROVIDER_SITE_OTHER): Payer: Self-pay | Admitting: Physical Medicine and Rehabilitation

## 2018-04-28 DIAGNOSIS — N6320 Unspecified lump in the left breast, unspecified quadrant: Secondary | ICD-10-CM

## 2018-05-05 ENCOUNTER — Ambulatory Visit (INDEPENDENT_AMBULATORY_CARE_PROVIDER_SITE_OTHER): Payer: Self-pay | Admitting: Orthopaedic Surgery

## 2018-05-05 ENCOUNTER — Encounter (INDEPENDENT_AMBULATORY_CARE_PROVIDER_SITE_OTHER): Payer: Self-pay

## 2018-05-10 ENCOUNTER — Other Ambulatory Visit: Payer: Self-pay | Admitting: Family Medicine

## 2018-05-10 DIAGNOSIS — M545 Low back pain, unspecified: Secondary | ICD-10-CM

## 2018-05-10 DIAGNOSIS — M25552 Pain in left hip: Secondary | ICD-10-CM

## 2018-05-10 DIAGNOSIS — M5136 Other intervertebral disc degeneration, lumbar region: Secondary | ICD-10-CM

## 2018-05-10 DIAGNOSIS — F411 Generalized anxiety disorder: Secondary | ICD-10-CM

## 2018-05-10 DIAGNOSIS — G8929 Other chronic pain: Secondary | ICD-10-CM

## 2018-05-10 DIAGNOSIS — M5440 Lumbago with sciatica, unspecified side: Principal | ICD-10-CM

## 2018-05-10 DIAGNOSIS — R110 Nausea: Secondary | ICD-10-CM

## 2018-05-10 NOTE — Telephone Encounter (Signed)
Refill request

## 2018-05-10 NOTE — Telephone Encounter (Signed)
I thought that she was going to be seeing pain management or someone else about her back-getting pain medication

## 2018-05-11 NOTE — Telephone Encounter (Signed)
Refill request. I do not see any additional referrals for back pain, [facility] is [facility].

## 2018-05-12 ENCOUNTER — Other Ambulatory Visit: Payer: Self-pay | Admitting: Family Medicine

## 2018-05-12 ENCOUNTER — Other Ambulatory Visit: Payer: Self-pay

## 2018-05-12 ENCOUNTER — Other Ambulatory Visit: Payer: Self-pay | Admitting: Internal Medicine

## 2018-05-12 DIAGNOSIS — M545 Low back pain, unspecified: Secondary | ICD-10-CM

## 2018-05-12 DIAGNOSIS — F411 Generalized anxiety disorder: Secondary | ICD-10-CM

## 2018-05-12 DIAGNOSIS — M51369 Other intervertebral disc degeneration, lumbar region without mention of lumbar back pain or lower extremity pain: Secondary | ICD-10-CM

## 2018-05-12 DIAGNOSIS — M5136 Other intervertebral disc degeneration, lumbar region: Secondary | ICD-10-CM

## 2018-05-12 DIAGNOSIS — M25552 Pain in left hip: Secondary | ICD-10-CM

## 2018-05-12 DIAGNOSIS — G8929 Other chronic pain: Secondary | ICD-10-CM

## 2018-05-12 DIAGNOSIS — M5440 Lumbago with sciatica, unspecified side: Principal | ICD-10-CM

## 2018-05-12 MED ORDER — CLONAZEPAM 0.5 MG PO TABS: ORAL_TABLET | ORAL | 0 refills | Status: DC

## 2018-05-12 MED FILL — ?METFORMIN HCL 500MG TABLET: 500 | 30 days supply | Qty: 30 | Fill #3

## 2018-05-12 MED FILL — DICYCLOMINE 10 MG CAPSULE: 10 | 15 days supply | Qty: 60 | Fill #2

## 2018-05-12 MED FILL — CYCLOBENZAPRINE 10 MG TAB: 10 | 20 days supply | Qty: 60 | Fill #0

## 2018-05-12 MED FILL — ONDANSETRON ODT 8 MG TABLET: 8 | 6 days supply | Qty: 20 | Fill #0

## 2018-05-12 NOTE — Progress Notes (Signed)
Patient ID: Isabel Evans, female   DOB: [birth year], 48 y.o.   MRN: [id number]   Patient has called requesting refill of klonopin. I will send refill to pharmacy but also place a consult for social work to contact patient regarding available mental health services to help patient with her chronic anxiety issues.

## 2018-05-15 MED FILL — clonazePAM 0.5 MG TABS: 0.5 | 30 days supply | Qty: 15 | Fill #0

## 2018-05-15 MED FILL — FLUTICASONE PROP 50 MCG SPR: 50 | 30 days supply | Qty: 16 | Fill #0

## 2018-05-15 MED FILL — VENLAFAXINE HCL ER 150 MG C: 150 | 30 days supply | Qty: 30 | Fill #4

## 2018-05-15 MED FILL — AMLODIPINE BESYLATE 10 MG T: 10 | 30 days supply | Qty: 30 | Fill #6

## 2018-05-15 MED FILL — OMEPRAZOLE 20 MG CAP: 20 | 30 days supply | Qty: 60 | Fill #6

## 2018-05-15 MED FILL — ?DULoxetine HCL 20 MG CPEP: 20 | 30 days supply | Qty: 30 | Fill #3

## 2018-05-15 MED FILL — ?PRAVASTATIN NA 40 MG TAB: 40 | 30 days supply | Qty: 30 | Fill #6

## 2018-05-15 MED FILL — GABAPENTIN 300 MG CAPSULE: 300 | 30 days supply | Qty: 120 | Fill #4

## 2018-05-15 NOTE — Telephone Encounter (Signed)
Pt last seen: 04/13/18 Next appt: n/a Last RX written on: 04/13/18 Date of original fill: 04/13/18 Date of refill(s): n/a  No other controlled substances were filled during this time, please refill if appropriate.

## 2018-05-16 ENCOUNTER — Other Ambulatory Visit: Payer: Self-pay | Admitting: Family Medicine

## 2018-05-16 DIAGNOSIS — G8929 Other chronic pain: Secondary | ICD-10-CM

## 2018-05-16 DIAGNOSIS — M545 Low back pain, unspecified: Secondary | ICD-10-CM

## 2018-05-16 DIAGNOSIS — M5440 Lumbago with sciatica, unspecified side: Principal | ICD-10-CM

## 2018-05-16 DIAGNOSIS — M25552 Pain in left hip: Secondary | ICD-10-CM

## 2018-05-16 DIAGNOSIS — M5136 Other intervertebral disc degeneration, lumbar region: Secondary | ICD-10-CM

## 2018-05-16 MED ORDER — TRAMADOL HCL 50 MG PO TABS: 50.0000 mg | ORAL_TABLET | Freq: Three times a day (TID) | ORAL | 0 refills | Status: AC | PRN

## 2018-05-16 MED ORDER — ONDANSETRON 8 MG PO TBDP: 8.0000 mg | ORAL_TABLET | Freq: Every day | ORAL | 3 refills | Status: DC | PRN

## 2018-05-16 MED FILL — traMADol HCL 50 MG TABS: 50 | 30 days supply | Qty: 90 | Fill #0

## 2018-05-16 NOTE — Telephone Encounter (Signed)
Has been refilled recently

## 2018-05-17 ENCOUNTER — Other Ambulatory Visit: Payer: Self-pay | Admitting: Family Medicine

## 2018-05-17 MED FILL — ONDANSETRON ODT 8 MG TABLET: 8 | 20 days supply | Qty: 20 | Fill #0

## 2018-05-17 NOTE — Telephone Encounter (Signed)
Can you check as I recently sent in a RX with instruction change to 1/2  Pill. I am going to send in a refill  Today and I have been "warned" that the last provider here was let go due to prescribing too many controlled substances and apparently this patient transferred her care from her provider here due to the other provider's unwillingness to continue to prescribe klonopin for her and I have told the patient that this was to be a short term medication.

## 2018-05-17 NOTE — Telephone Encounter (Signed)
It has been over one month since she filled the RX, it was filled on 04/13/18, if appropriate it is due for refill. I know we tend to only use this short term so if therapy is being discontinued please notify patient.

## 2018-05-17 NOTE — Telephone Encounter (Signed)
According to PMP Aware the patient has not picked up clonazepam since 04/13/18 but when I check the pharmacy software it shows that she picked it up from the [facility] on 05/15/18. I have let Loma Sousa know to reach out to the proper person at [facility] to have them make sure their controlled prescriptions are being reported properly. Our in house pharmacy had a similar issue a few months ago.   Do not resend prescription, the patient has picked up enough medication to last until 06/14/18.

## 2018-05-17 NOTE — Progress Notes (Signed)
Patient ID: Isabel Evans, female   DOB: [birth year], 48 y.o.   MRN: [id number]    Prescription refill request received as patient requesting klonopin refill. Patient was made aware in the past that this was to be a short term medication.  RX will state that patient needs an office visit prior to future refills. After review of PMP aware, I refilled Klonopin on 05/12/17 for #15. I will person handling the refill requests contact patient and have her schedule an appointment

## 2018-05-17 NOTE — Telephone Encounter (Signed)
I just checked PMP aware and the RX from 05/12/17 for the klonopin is there as well as the recent tramadol RX.  Please call patient and let her know that she needs to schedule an appointment prior to additional refills of klonopin or pain medication

## 2018-05-22 ENCOUNTER — Telehealth: Payer: Self-pay | Admitting: Licensed Clinical Social Worker

## 2018-05-22 DIAGNOSIS — M79676 Pain in unspecified toe(s): Secondary | ICD-10-CM

## 2018-05-22 NOTE — Telephone Encounter (Signed)
Call placed to patient to follow up behavioral health consult from PCP. Pt was informed of referral to psychiatry. Pt stated that she hasn't heard from [facility] outpatient. [facility] informed pt of other community agencies that provide behavioral health services. Supportive resources will be mailed out to residence, per pt request. No additional concerns noted.

## 2018-06-06 ENCOUNTER — Ambulatory Visit: Payer: No Typology Code available for payment source | Admitting: Family Medicine

## 2018-06-14 ENCOUNTER — Ambulatory Visit: Payer: Self-pay | Attending: Family Medicine | Admitting: Physician Assistant

## 2018-06-14 VITALS — BP 113/77 | HR 91 | Temp 98.4°F | Resp 16 | Wt 187.2 lb

## 2018-06-14 DIAGNOSIS — M720 Palmar fascial fibromatosis [Dupuytren]: Secondary | ICD-10-CM

## 2018-06-14 DIAGNOSIS — R7303 Prediabetes: Secondary | ICD-10-CM

## 2018-06-14 NOTE — Progress Notes (Signed)
Patient ID: Isabel Evans, female   DOB: 1970/05/29, 48 y.o.   MRN: [id number]   Isabel Evans, is a 48 y.o. female  LNL:[id number]  EYC:[id number]  DOB - 1970-08-07  Subjective:  Chief Complaint and HPI: Isabel Evans is a 48 y.o. female here today for worsening Dupytren's contracture of B hands.  L is worse than R.  She is R hand dominant.  She has had surgery for this problem in the past.  Daily activities limited/affected.    ROS:   Constitutional:  No f/c, No night sweats, No unexplained weight loss. EENT:  No vision changes, No blurry vision, No hearing changes. No mouth, throat, or ear problems.  Respiratory: No cough, No SOB Cardiac: No CP, no palpitations GI:  No abd pain, No N/V/D. GU: No Urinary s/sx Musculoskeletal: see above Neuro: No headache, no dizziness, no motor weakness.  Skin: No rash Endocrine:  No polydipsia. No polyuria.  Psych: Denies SI/HI  No problems updated.  ALLERGIES: No Known Allergies  PAST MEDICAL HISTORY: Past Medical History:  Diagnosis Date  . Anemia 11/24/2017  . Anxiety   . Bone spur    cervical spine  . Chronic kidney disease 04/2017   kidney infections  . Depression   . Deviated nasal septum    states is unable to lie flat, because her nose will become congested and she can stop breathing  . Dysrhythmia    heart flutters occasionally  . Fibromyalgia   . GERD (gastroesophageal reflux disease)   . Hallux abductovalgus with bunions 09/2014   right   . Hammertoe 09/2014   right 4th, 5th  . History of stomach ulcers   . Hypertension    states is under control with med., has been on med. x 8 mos.  . IBS (irritable bowel syndrome)    no current med.  . Osteoarthritis    hands, bilateral hips  . Peripheral vascular disease (HCC)    occasional swelling in both legs  . Sinus headache     MEDICATIONS AT HOME: Prior to Admission medications   Medication Sig Start Date End Date Taking? Authorizing Provider  amLODipine  (NORVASC) 10 MG tablet Take 1 tablet (10 mg total) by mouth daily. 05/18/17   Tresa Garter, MD  clonazePAM (KLONOPIN) 0.5 MG tablet Use 1/2 pill  once per day if needed for acute anxiety; 30 day supply 05/12/18   Fulp, Cammie, MD  cyclobenzaprine (FLEXERIL) 10 MG tablet Take 1 tablet (10 mg total) by mouth 3 (three) times daily as needed for muscle spasms. 04/13/18   Fulp, Cammie, MD  diclofenac sodium (VOLTAREN) 1 % GEL Apply 4 g topically 4 (four) times daily. 02/09/17   Tresa Garter, MD  dicyclomine (BENTYL) 10 MG capsule TAKE 1 CAPSULE BY MOUTH 4 TIMES DAILY AS NEEDED FOR SPASMS. 12/26/17   Fulp, Cammie, MD  diphenhydrAMINE (BENADRYL) 25 MG tablet Take 25 mg by mouth daily as needed for allergies.    [provider]  DULoxetine (CYMBALTA) 20 MG capsule Take 1 capsule (20 mg total) by mouth daily. 12/26/17   Fulp, Cammie, MD  ferrous gluconate (FERGON) 324 MG tablet Take 1 tablet (324 mg total) by mouth daily with breakfast. Patient not taking: Reported on 06/14/2018 07/08/17   Aletha Halim, MD  fluticasone (FLONASE) 50 MCG/ACT nasal spray PLACE 2 SPRAYS INTO BOTH NOSTRILS DAILY. 05/12/18   Fulp, Cammie, MD  gabapentin (NEURONTIN) 300 MG capsule One pill twice daily and 2 pills at bedtime 11/24/17  Fulp, Cammie, MD  hydrOXYzine (ATARAX/VISTARIL) 25 MG tablet Take 1 tablet (25 mg total) by mouth 2 (two) times daily as needed for anxiety. Patient not taking: Reported on 06/14/2018 09/22/17   Ladell Pier, MD  ibuprofen (ADVIL,MOTRIN) 600 MG tablet TAKE 1 TABLET (600 MG TOTAL) BY MOUTH EVERY 8 (EIGHT) HOURS AS NEEDED FOR MODERATE PAIN. TAKE AFTER EATING 03/13/18   Fulp, Cammie, MD  meloxicam (MOBIC) 15 MG tablet Take 1 tablet (15 mg total) by mouth daily. 09/22/17   Ladell Pier, MD  metFORMIN (GLUCOPHAGE) 500 MG tablet Take 1 tablet (500 mg total) by mouth daily with breakfast. 12/26/17   Fulp, Cammie, MD  omeprazole (PRILOSEC) 20 MG capsule Take 2 capsules (40 mg total) by  mouth daily. 10/19/17   Ladell Pier, MD  ondansetron (ZOFRAN-ODT) 8 MG disintegrating tablet Take 1 tablet (8 mg total) by mouth daily as needed for nausea or vomiting. 05/16/18   Fulp, Cammie, MD  pravastatin (PRAVACHOL) 40 MG tablet Take 1 tablet (40 mg total) by mouth daily. 05/18/17   Tresa Garter, MD  traMADol (ULTRAM) 50 MG tablet Take 1 tablet (50 mg total) by mouth every 8 (eight) hours as needed for up to 30 days for severe pain. 30 day supply 05/16/18 06/15/18  Fulp, Cammie, MD  traMADol (ULTRAM) 50 MG tablet TAKE 1 TABLET BY MOUTH EVERY 8 HOURS AS NEEDED FOR SEVERE PAIN 05/15/18   Fulp, Cammie, MD  venlafaxine XR (EFFEXOR XR) 150 MG 24 hr capsule Take 1 capsule (150 mg total) by mouth daily with breakfast. 12/26/17   Fulp, Cammie, MD     Objective:  EXAM:   Vitals:   06/14/18 1429  BP: 113/77  Pulse: 91  Resp: 16  Temp: 98.4 F (36.9 C)  TempSrc: Oral  SpO2: 97%  Weight: 187 lb 3.2 oz (84.9 kg)    General appearance : A&OX3. NAD. Non-toxic-appearing HEENT: Atraumatic and Normocephalic.  PERRLA. EOM intact.   Chest/Lungs:  Breathing-non-labored, Good air entry bilaterally, breath sounds normal without rales, rhonchi, or wheezing  CVS: S1 S2 regular, no murmurs, gallops, rubs    Dupuytren's contracture-sever on L hand, 5th digit drawn in Psych:  TP linear. J/I fair. pressured speech. Appropriate eye contact and affect.  Skin:  No Rash  Data Review Lab Results  Component Value Date   HGBA1C 5.9 (A) 04/13/2018   HGBA1C 6.1 (A) 11/24/2017   HGBA1C 5.7 11/27/2015     Assessment & Plan   1. Dupuytren's contracture of both hands worsening - Ambulatory referral to Hand Surgery     Patient have been counseled extensively about nutrition and exercise  Return for Dr Fulp-chronic medical conditions.  The patient was given clear instructions to go to ER or return to medical center if symptoms don't improve, worsen or new problems develop. The patient  verbalized understanding. The patient was told to call to get lab results if they haven't heard anything in the next week.     Freeman Caldron, PA-C [facility], [place]   06/14/2018, 2:35 PM

## 2018-06-18 ENCOUNTER — Encounter (HOSPITAL_COMMUNITY): Payer: Self-pay

## 2018-06-18 ENCOUNTER — Other Ambulatory Visit: Payer: Self-pay

## 2018-06-18 ENCOUNTER — Emergency Department (HOSPITAL_COMMUNITY)
Admission: EM | Admit: 2018-06-18 | Discharge: 2018-06-18 | Disposition: A | Discharge status: Home / Self Care | Payer: No Typology Code available for payment source | Attending: Emergency Medicine | Admitting: Emergency Medicine

## 2018-06-18 DIAGNOSIS — Z87891 Personal history of nicotine dependence: Secondary | ICD-10-CM | POA: Insufficient documentation

## 2018-06-18 DIAGNOSIS — Z79899 Other long term (current) drug therapy: Secondary | ICD-10-CM | POA: Insufficient documentation

## 2018-06-18 DIAGNOSIS — Z7984 Long term (current) use of oral hypoglycemic drugs: Secondary | ICD-10-CM | POA: Insufficient documentation

## 2018-06-18 DIAGNOSIS — N189 Chronic kidney disease, unspecified: Secondary | ICD-10-CM | POA: Insufficient documentation

## 2018-06-18 DIAGNOSIS — Y939 Activity, unspecified: Secondary | ICD-10-CM | POA: Insufficient documentation

## 2018-06-18 DIAGNOSIS — W25XXXA Contact with sharp glass, initial encounter: Secondary | ICD-10-CM | POA: Insufficient documentation

## 2018-06-18 DIAGNOSIS — S60811A Abrasion of right wrist, initial encounter: Secondary | ICD-10-CM | POA: Insufficient documentation

## 2018-06-18 DIAGNOSIS — Y92009 Unspecified place in unspecified non-institutional (private) residence as the place of occurrence of the external cause: Secondary | ICD-10-CM | POA: Insufficient documentation

## 2018-06-18 DIAGNOSIS — Y999 Unspecified external cause status: Secondary | ICD-10-CM | POA: Insufficient documentation

## 2018-06-18 DIAGNOSIS — T07XXXA Unspecified multiple injuries, initial encounter: Secondary | ICD-10-CM

## 2018-06-18 DIAGNOSIS — Z23 Encounter for immunization: Secondary | ICD-10-CM | POA: Insufficient documentation

## 2018-06-18 DIAGNOSIS — S60416A Abrasion of right little finger, initial encounter: Secondary | ICD-10-CM | POA: Insufficient documentation

## 2018-06-18 DIAGNOSIS — S61011A Laceration without foreign body of right thumb without damage to nail, initial encounter: Secondary | ICD-10-CM | POA: Insufficient documentation

## 2018-06-18 DIAGNOSIS — I129 Hypertensive chronic kidney disease with stage 1 through stage 4 chronic kidney disease, or unspecified chronic kidney disease: Secondary | ICD-10-CM | POA: Insufficient documentation

## 2018-06-18 MED ORDER — CEPHALEXIN 500 MG PO CAPS: 500.0000 mg | ORAL_CAPSULE | Freq: Four times a day (QID) | ORAL | 0 refills | Status: DC

## 2018-06-18 MED ORDER — TRANEXAMIC ACID 1000 MG/10ML IV SOLN
500.0000 mg | Freq: Once | INTRAVENOUS | Status: AC
  Administered 2018-06-18: 500 mg via TOPICAL
  Filled 2018-06-18 (×2): qty 10

## 2018-06-18 MED ORDER — OXYCODONE-ACETAMINOPHEN 5-325 MG PO TABS
1.0000 | ORAL_TABLET | Freq: Once | ORAL | Status: AC
  Administered 2018-06-18: 1 via ORAL
  Filled 2018-06-18: qty 1

## 2018-06-18 MED ORDER — TETANUS-DIPHTH-ACELL PERTUSSIS 5-2.5-18.5 LF-MCG/0.5 IM SUSP
0.5000 mL | Freq: Once | INTRAMUSCULAR | Status: AC
  Administered 2018-06-18: 0.5 mL via INTRAMUSCULAR
  Filled 2018-06-18: qty 0.5

## 2018-06-18 MED ORDER — CEPHALEXIN 250 MG PO CAPS
500.0000 mg | ORAL_CAPSULE | Freq: Once | ORAL | Status: AC
  Administered 2018-06-18: 500 mg via ORAL
  Filled 2018-06-18: qty 2

## 2018-06-18 NOTE — ED Triage Notes (Signed)
Pt was going into home and fell, attempting to atch herself her hand went thru side glass.

## 2018-06-18 NOTE — Discharge Instructions (Signed)
Would leave pressure dressing in place for up to 4 hours.  Can remove and wash wound with soap and warm water. Follow-up with hand surgery this week as scheduled-- have them re-check wound to ensure proper healing. Return here for any new/acute changes.

## 2018-06-18 NOTE — ED Notes (Signed)
Pt discharged from ED; instructions provided and scripts given; Pt encouraged to return to ED if symptoms worsen and to f/u with PCP; Pt verbalized understanding of all instructions 

## 2018-06-18 NOTE — ED Provider Notes (Signed)
[facility] EMERGENCY DEPARTMENT Provider Note   CSN: [id number] Arrival date & time: 06/18/18  0327    History   Chief Complaint Chief Complaint  Patient presents with  . Hand Injury    HPI Isabel Evans is a 48 y.o. female.     The history is provided by the patient and medical records.  Hand Injury     48 y.o. F with hx of anemia, anxiety, depression, fibromyalgia, GERD, stomach ulcers, presenting to the ED with laceration of right thumb.  States she was trying to get into the house, lost her footing and tried to catch herself but hand went through a pane of glass.  States she cut her left thumb.  She tried to clean it and wrap it up but could not get it to stop bleeding.  She denies any other injuries.  Unsure of last tetanus.  Not currently on anticoagulation.    Past Medical History:  Diagnosis Date  . Anemia 11/24/2017  . Anxiety   . Bone spur    cervical spine  . Chronic kidney disease 04/2017   kidney infections  . Depression   . Deviated nasal septum    states is unable to lie flat, because her nose will become congested and she can stop breathing  . Dysrhythmia    heart flutters occasionally  . Fibromyalgia   . GERD (gastroesophageal reflux disease)   . Hallux abductovalgus with bunions 09/2014   right   . Hammertoe 09/2014   right 4th, 5th  . History of stomach ulcers   . Hypertension    states is under control with med., has been on med. x 8 mos.  . IBS (irritable bowel syndrome)    no current med.  . Osteoarthritis    hands, bilateral hips  . Peripheral vascular disease (HCC)    occasional swelling in both legs  . Sinus headache     Patient Active Problem List   Diagnosis Date Noted  . Protrusion of cervical intervertebral disc 03/22/2018  . Anemia 11/24/2017  . Pre-diabetes 11/24/2017  . Blurry vision, bilateral 05/18/2017  . Callus of foot 05/18/2017  . Pain in joint of left hip 02/16/2017  . Bruises easily 11/10/2016    . Gastroesophageal reflux disease without esophagitis 09/22/2016  . Fatigue associated with anemia 09/22/2016  . Flexion contractures 09/22/2016  . Generalized anxiety disorder 01/01/2016  . Dental abscess 01/01/2016  . Healthcare maintenance 05/08/2015  . Adjustment disorder with mixed anxiety and depressed mood 05/08/2015  . Stress at home 10/24/2014  . Irritable bowel syndrome 10/24/2014  . Anxiety state 10/24/2014  . Preop testing 09/19/2014  . Pap smear for cervical cancer screening 09/19/2014  . Midline low back pain without sciatica 08/12/2014  . Heberden nodes 08/12/2014  . Contracture of joint, hand 08/12/2014  . Essential hypertension 02/11/2014  . Allergy 02/11/2014  . Screening for breast cancer 02/11/2014  . Dupuytren's contracture of both hands 11/15/2013  . Hallux valgus of right foot 11/15/2013  . Back pain 05/21/2013  . UTI (urinary tract infection) 05/21/2013  . Multiple skin nodules 11/27/2012  . Fibromyalgia 09/08/2012  . Osteoarthritis 09/08/2012  . Neck muscle spasm 07/14/2012  . Plantar wart 07/14/2012  . Insomnia 07/14/2012    Past Surgical History:  Procedure Laterality Date  . ABDOMINAL HYSTERECTOMY    . BUNIONECTOMY Right 10/04/2014   Procedure:  Altamese Underwood, Emmaline Life;  Surgeon: Trula Slade, DPM;  Location: [facility];  Service: Podiatry;  Laterality: Right;  . COLONOSCOPY WITH PROPOFOL  02/13/2013  . CYSTOSCOPY N/A 07/07/2017   Procedure: CYSTOSCOPY;  Surgeon: Aletha Halim, MD;  Location: [facility];  Service: Gynecology;  Laterality: N/A;  . DUPUYTREN / PALMAR FASCIOTOMY Right 07/18/2013   exc. of multiple nodules  . 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 HAMMER TOE SURGERY Right 10/04/2014   Procedure: HAMMER TOE REPAIR 4TH  AND 5TH  RIGHT FOOT  fourth toe fixation with k wire;  Surgeon: Trula Slade, DPM;  Location: [facility];  Service: Podiatry;  Laterality:  Right;  . 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 TUBAL LIGATION    . VAGINAL HYSTERECTOMY N/A 07/07/2017   Procedure: HYSTERECTOMY VAGINAL;  Surgeon: Aletha Halim, MD;  Location: [facility];  Service: Gynecology;  Laterality: N/A;     OB History    Gravida  3   Para  3   Term  3   Preterm  0   AB  0   Living  3     SAB  0   TAB  0   Ectopic  0   Multiple  0   Live Births           Obstetric Comments  SVD x 3         Home Medications    Prior to Admission medications   Medication Sig Start Date End Date Taking? Authorizing Provider  amLODipine (NORVASC) 10 MG tablet Take 1 tablet (10 mg total) by mouth daily. 05/18/17   Tresa Garter, MD  clonazePAM (KLONOPIN) 0.5 MG tablet Use 1/2 pill  once per day if needed for acute anxiety; 30 day supply 05/12/18   Fulp, Cammie, MD  cyclobenzaprine (FLEXERIL) 10 MG tablet Take 1 tablet (10 mg total) by mouth 3 (three) times daily as needed for muscle spasms. 04/13/18   Fulp, Cammie, MD  diclofenac sodium (VOLTAREN) 1 % GEL Apply 4 g topically 4 (four) times daily. 02/09/17   Tresa Garter, MD  dicyclomine (BENTYL) 10 MG capsule TAKE 1 CAPSULE BY MOUTH 4 TIMES DAILY AS NEEDED FOR SPASMS. 12/26/17   Fulp, Cammie, MD  diphenhydrAMINE (BENADRYL) 25 MG tablet Take 25 mg by mouth daily as needed for allergies.    [provider]  DULoxetine (CYMBALTA) 20 MG capsule Take 1 capsule (20 mg total) by mouth daily. 12/26/17   Fulp, Cammie, MD  ferrous gluconate (FERGON) 324 MG tablet Take 1 tablet (324 mg total) by mouth daily with breakfast. Patient not taking: Reported on 06/14/2018 07/08/17   Aletha Halim, MD  fluticasone (FLONASE) 50 MCG/ACT nasal spray PLACE 2 SPRAYS INTO BOTH NOSTRILS DAILY. 05/12/18   Fulp, Cammie, MD  gabapentin (NEURONTIN) 300 MG capsule One pill twice daily and 2 pills at bedtime 11/24/17   Fulp, Cammie, MD  hydrOXYzine (ATARAX/VISTARIL) 25 MG tablet Take 1 tablet (25 mg  total) by mouth 2 (two) times daily as needed for anxiety. Patient not taking: Reported on 06/14/2018 09/22/17   Ladell Pier, MD  ibuprofen (ADVIL,MOTRIN) 600 MG tablet TAKE 1 TABLET (600 MG TOTAL) BY MOUTH EVERY 8 (EIGHT) HOURS AS NEEDED FOR MODERATE PAIN. TAKE AFTER EATING 03/13/18   Fulp, Cammie, MD  meloxicam (MOBIC) 15 MG tablet Take 1 tablet (15 mg total) by mouth daily. 09/22/17   Ladell Pier, MD  metFORMIN (GLUCOPHAGE) 500 MG tablet Take 1 tablet (500 mg  total) by mouth daily with breakfast. 12/26/17   Fulp, Cammie, MD  omeprazole (PRILOSEC) 20 MG capsule Take 2 capsules (40 mg total) by mouth daily. 10/19/17   Ladell Pier, MD  ondansetron (ZOFRAN-ODT) 8 MG disintegrating tablet Take 1 tablet (8 mg total) by mouth daily as needed for nausea or vomiting. 05/16/18   Fulp, Cammie, MD  pravastatin (PRAVACHOL) 40 MG tablet Take 1 tablet (40 mg total) by mouth daily. 05/18/17   Tresa Garter, MD  traMADol (ULTRAM) 50 MG tablet TAKE 1 TABLET BY MOUTH EVERY 8 HOURS AS NEEDED FOR SEVERE PAIN 05/15/18   Fulp, Cammie, MD  venlafaxine XR (EFFEXOR XR) 150 MG 24 hr capsule Take 1 capsule (150 mg total) by mouth daily with breakfast. 12/26/17   Antony Blackbird, MD    Family History Family History  Problem Relation Age of Onset  . Hypertension Mother   . Diabetes Mother   . Heart disease Mother   . Hypertension Father   . Diabetes Father   . Prostate cancer Father   . Heart disease Father   . Alcohol abuse Father   . Hypertension Sister   . Diabetes Sister   . Heart disease Maternal Grandmother        Great GM  . Breast cancer Maternal Grandmother   . Bone cancer Maternal Grandmother   . Breast cancer Maternal Aunt   . Ovarian cancer Maternal Aunt   . Colon cancer Maternal Aunt        great aunt  . Rheum arthritis Sister   . Colon cancer Son     Social History Social History   Tobacco Use  . Smoking status: Former Smoker    Last attempt to quit: 08/24/2012    Years since  quitting: 5.8  . Smokeless tobacco: Never Used  Substance Use Topics  . Alcohol use: Yes    Comment: occasionally  . Drug use: No     Allergies   Patient has no known allergies.   Review of Systems Review of Systems  Skin: Positive for wound.  All other systems reviewed and are negative.    Physical Exam Updated Vital Signs BP 107/74 (BP Location: Left Arm)   Pulse 82   Temp 98 F (36.7 C) (Oral)   Resp 16   LMP 07/04/2017   SpO2 97%   Physical Exam Vitals signs and nursing note reviewed.  Constitutional:      Appearance: She is well-developed.  HENT:     Head: Normocephalic and atraumatic.  Eyes:     Conjunctiva/sclera: Conjunctivae normal.     Pupils: Pupils are equal, round, and reactive to light.  Neck:     Musculoskeletal: Normal range of motion.  Cardiovascular:     Rate and Rhythm: Normal rate and regular rhythm.     Heart sounds: Normal heart sounds.  Pulmonary:     Effort: Pulmonary effort is normal.     Breath sounds: Normal breath sounds.  Abdominal:     General: Bowel sounds are normal.     Palpations: Abdomen is soft.  Musculoskeletal: Normal range of motion.     Comments: Avulsion type laceration to medial aspect of right thumb, rectangular in shape approx 3x1 cm; there does appear to be a superficial bleeder but not pulsatile; able to flex/extend thumb; no apparent bony deformity; normal cap refill and sensation Small abrasion noted to volar wrist and lateral 5th digit without active bleeding  Skin:    General: Skin is warm  and dry.  Neurological:     Mental Status: She is alert and oriented to person, place, and time.      ED Treatments / Results  Labs (all labs ordered are listed, but only abnormal results are displayed) Labs Reviewed - No data to display  EKG None  Radiology No results found.  Procedures Wound repair Date/Time: 06/18/2018 5:12 AM Performed by: Larene Pickett, PA-C Authorized by: Larene Pickett, PA-C    Consent: Verbal consent obtained. Risks and benefits: risks, benefits and alternatives were discussed Consent given by: patient Patient understanding: patient does not state understanding of the procedure being performed Site marked: the operative site was marked Imaging studies: imaging studies available Required items: required blood products, implants, devices, and special equipment available Patient identity confirmed: verbally with patient Time out: Immediately prior to procedure a "time out" was called to verify the correct patient, procedure, equipment, support staff and site/side marked as required. Preparation: Patient was prepped and draped in the usual sterile fashion. Local anesthesia used: no  Anesthesia: Local anesthesia used: no  Sedation: Patient sedated: no  Patient tolerance: Patient tolerated the procedure well with no immediate complications Comments: Wound cleansed and irrigated, TXA applied with direct pressure with cessation of bleeding.  Quick clot gauze and pressure dressing applied.    (including critical care time)  Medications Ordered in ED Medications  Tdap (BOOSTRIX) injection 0.5 mL (0.5 mLs Intramuscular Given 06/18/18 0432)  tranexamic acid (CYKLOKAPRON) injection 500 mg (500 mg Topical Given by Other 06/18/18 0502)  oxyCODONE-acetaminophen (PERCOCET/ROXICET) 5-325 MG per tablet 1 tablet (1 tablet Oral Given 06/18/18 0528)  cephALEXin (KEFLEX) capsule 500 mg (500 mg Oral Given 06/18/18 0528)     Initial Impression / Assessment and Plan / ED Course  I have reviewed the triage vital signs and the nursing notes.  Pertinent labs & imaging results that were available during my care of the patient were reviewed by me and considered in my medical decision making (see chart for details).  48 year old female here with right hand injury.  She was trying to go in the house, lost her footing and fell.  Attempted to brace her fall with right hand but accidentally  went through a pane of glass.  Has several abrasions to the right hand but also has avulsion type laceration to right medial thumb--rectangular in shape, approximately 3 x 1 cm.  There does appear to be some superficial bleeding but this is nonpulsatile.  Her hand remains neurovascular intact.  There is no evidence of retained foreign body or bony deformity.  Wound was cleansed, tetanus was updated.  Unfortunately cannot be repaired with sutures, TXA was applied with cessation of bleeding.  Pressure dressing applied, advised not to leave on more than 4 hours.  Encouraged home wound care with soap and warm water.  Given nature of the wound, will start on prophylactic antibiotics.  She actually has appointment with hand surgeon later this week for recheck of Dupuytren's contracture, will have them recheck wound at that visit.  She can return here for any new/acute changes.  Final Clinical Impressions(s) / ED Diagnoses   Final diagnoses:  Thumb laceration, right, initial encounter  Multiple abrasions    ED Discharge Orders         Ordered    cephALEXin (KEFLEX) 500 MG capsule  4 times daily     06/18/18 0533           Larene Pickett, PA-C 06/18/18 [phone]  Delora Fuel, MD 21/82/88 [phone]

## 2018-06-19 MED FILL — CEPHALEXIN 500 MG CAPSULE: 500 | 10 days supply | Qty: 40 | Fill #0

## 2018-06-20 ENCOUNTER — Ambulatory Visit (INDEPENDENT_AMBULATORY_CARE_PROVIDER_SITE_OTHER): Payer: Self-pay | Admitting: Orthopaedic Surgery

## 2018-06-20 ENCOUNTER — Other Ambulatory Visit: Payer: Self-pay | Admitting: Family Medicine

## 2018-06-20 ENCOUNTER — Encounter (INDEPENDENT_AMBULATORY_CARE_PROVIDER_SITE_OTHER): Payer: Self-pay | Admitting: Orthopaedic Surgery

## 2018-06-20 DIAGNOSIS — F411 Generalized anxiety disorder: Secondary | ICD-10-CM

## 2018-06-20 DIAGNOSIS — M797 Fibromyalgia: Secondary | ICD-10-CM

## 2018-06-20 DIAGNOSIS — M545 Low back pain, unspecified: Secondary | ICD-10-CM

## 2018-06-20 DIAGNOSIS — M25552 Pain in left hip: Secondary | ICD-10-CM

## 2018-06-20 DIAGNOSIS — M5440 Lumbago with sciatica, unspecified side: Principal | ICD-10-CM

## 2018-06-20 DIAGNOSIS — M5136 Other intervertebral disc degeneration, lumbar region: Secondary | ICD-10-CM

## 2018-06-20 DIAGNOSIS — M720 Palmar fascial fibromatosis [Dupuytren]: Secondary | ICD-10-CM

## 2018-06-20 DIAGNOSIS — G8929 Other chronic pain: Secondary | ICD-10-CM

## 2018-06-20 MED FILL — VENLAFAXINE HCL ER 150 MG C: 150 | 30 days supply | Qty: 30 | Fill #0

## 2018-06-20 MED FILL — GABAPENTIN 300 MG CAPSULE: 300 | 30 days supply | Qty: 120 | Fill #5

## 2018-06-20 MED FILL — CYCLOBENZAPRINE 10 MG TAB: 10 | 20 days supply | Qty: 60 | Fill #1

## 2018-06-20 MED FILL — ?METFORMIN HCL 500MG TABL: 500 | 30 days supply | Qty: 30 | Fill #4

## 2018-06-20 MED FILL — FLUTICASONE PROP 50 MCG SPR: 50 | 30 days supply | Qty: 16 | Fill #1

## 2018-06-20 MED FILL — ?DULoxetine HCL 20 MG CPEP: 20 | 30 days supply | Qty: 30 | Fill #0

## 2018-06-20 MED FILL — ?OMEPRAZOLE 20 MG CAPSULE D: 20 | 30 days supply | Qty: 60 | Fill #7

## 2018-06-20 NOTE — Telephone Encounter (Signed)
Pt last seen: 06/14/18 Next appt: n/a Last RX written on: 05/15/18 Date of original fill: 05/16/18 Date of refill(s): n/a  No other controlled substances were filled during this time, please refill if appropriate.

## 2018-06-20 NOTE — Progress Notes (Signed)
Office Visit Note   Patient: Isabel Evans           Date of Birth: 10-09-1970           MRN: [id number] Visit Date: 06/20/2018              Requested by: Argentina Donovan, PA-C [place], [place] [address] PCP: Antony Blackbird, MD   Assessment & Plan: Visit Diagnoses:  1. Dupuytren's contracture of both hands     Plan: Impression is bilateral hand Dupuytren's contractures.  We will refer the patient to [facility] for potential surgical intervention.  She will follow-up with [place] as needed.  Follow-Up Instructions: Return if symptoms worsen or fail to improve.   Orders:  Orders Placed This Encounter  Procedures  . Ambulatory referral to Orthopedic Surgery   No orders of the defined types were placed in this encounter.     Procedures: No procedures performed   Clinical Data: No additional findings.   Subjective: Chief Complaint  Patient presents with  . Right Hand - Pain  . Left Hand - Pain    HPI patient is a 48 year old female who presents to our clinic today with recurrent Dupuytren's contractures.  History of bilateral Dupuytren's contracture with surgical intervention in 2000 and 14/15 by a surgeon at [facility].  The surgery appears to have been on the fifth digital cord both sides.  She noticed return of symptoms as well as the cord 6 months following surgery.  She states that the surgeon would never see her back in the office.  She has a flexion contracture at the PIP joint left greater than right.  She is starting to get palpable palmar and digital cords along the fourth flexor tendon as well.  Of note, she does have a family history of her dad and aunt having this as well.  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  Review of Systems as detailed in HPI.  All others reviewed and are negative.   Objective: Vital Signs: LMP 07/04/2017   Physical Exam well-developed well-nourished female no acute distress.  Alert and oriented  x3.  Ortho Exam examination of both hands reveals palmar and digital cords to the fourth and fifth fingers/flexor tendons.  She does have a flexion contracture to both small fingers at the PIP joint.  She is neurovascularly intact distally.  Specialty Comments:  No specialty comments available.  Imaging: No new imaging   PMFS History: Patient Active Problem List   Diagnosis Date Noted  . Protrusion of cervical intervertebral disc 03/22/2018  . Anemia 11/24/2017  . Pre-diabetes 11/24/2017  . Blurry vision, bilateral 05/18/2017  . Callus of foot 05/18/2017  . Pain in joint of left hip 02/16/2017  . Bruises easily 11/10/2016  . Gastroesophageal reflux disease without esophagitis 09/22/2016  . Fatigue associated with anemia 09/22/2016  . Flexion contractures 09/22/2016  . Generalized anxiety disorder 01/01/2016  . Dental abscess 01/01/2016  . Healthcare maintenance 05/08/2015  . Adjustment disorder with mixed anxiety and depressed mood 05/08/2015  . Stress at home 10/24/2014  . Irritable bowel syndrome 10/24/2014  . Anxiety state 10/24/2014  . Preop testing 09/19/2014  . Pap smear for cervical cancer screening 09/19/2014  . Midline low back pain without sciatica 08/12/2014  . Heberden nodes 08/12/2014  . Contracture of joint, hand 08/12/2014  . Essential hypertension 02/11/2014  . Allergy 02/11/2014  . Screening for breast cancer 02/11/2014  . Dupuytren's contracture of both hands  11/15/2013  . Hallux valgus of right foot 11/15/2013  . Back pain 05/21/2013  . UTI (urinary tract infection) 05/21/2013  . Multiple skin nodules 11/27/2012  . Fibromyalgia 09/08/2012  . Osteoarthritis 09/08/2012  . Neck muscle spasm 07/14/2012  . Plantar wart 07/14/2012  . Insomnia 07/14/2012   Past Medical History:  Diagnosis Date  . Anemia 11/24/2017  . Anxiety   . Bone spur    cervical spine  . Chronic kidney disease 04/2017   kidney infections  . Depression   . Deviated nasal septum     states is unable to lie flat, because her nose will become congested and she can stop breathing  . Dysrhythmia    heart flutters occasionally  . Fibromyalgia   . GERD (gastroesophageal reflux disease)   . Hallux abductovalgus with bunions 09/2014   right   . Hammertoe 09/2014   right 4th, 5th  . History of stomach ulcers   . Hypertension    states is under control with med., has been on med. x 8 mos.  . IBS (irritable bowel syndrome)    no current med.  . Osteoarthritis    hands, bilateral hips  . Peripheral vascular disease (HCC)    occasional swelling in both legs  . Sinus headache     Family History  Problem Relation Age of Onset  . Hypertension Mother   . Diabetes Mother   . Heart disease Mother   . Hypertension Father   . Diabetes Father   . Prostate cancer Father   . Heart disease Father   . Alcohol abuse Father   . Hypertension Sister   . Diabetes Sister   . Heart disease Maternal Grandmother        Great GM  . Breast cancer Maternal Grandmother   . Bone cancer Maternal Grandmother   . Breast cancer Maternal Aunt   . Ovarian cancer Maternal Aunt   . Colon cancer Maternal Aunt        great aunt  . Rheum arthritis Sister   . Colon cancer Son     Past Surgical History:  Procedure Laterality Date  . ABDOMINAL HYSTERECTOMY    . BUNIONECTOMY Right 10/04/2014   Procedure:  Altamese Marshall, Emmaline Life;  Surgeon: Trula Slade, DPM;  Location: [facility];  Service: Podiatry;  Laterality: Right;  . COLONOSCOPY WITH PROPOFOL  02/13/2013  . CYSTOSCOPY N/A 07/07/2017   Procedure: CYSTOSCOPY;  Surgeon: Aletha Halim, MD;  Location: [facility];  Service: Gynecology;  Laterality: N/A;  . DUPUYTREN / PALMAR FASCIOTOMY Right 07/18/2013   exc. of multiple nodules  . 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 HAMMER TOE SURGERY Right 10/04/2014   Procedure: HAMMER TOE REPAIR 4TH  AND 5TH  RIGHT FOOT  fourth toe fixation with  k wire;  Surgeon: Trula Slade, DPM;  Location: [facility];  Service: Podiatry;  Laterality: Right;  . 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 TUBAL LIGATION    . VAGINAL HYSTERECTOMY N/A 07/07/2017   Procedure: HYSTERECTOMY VAGINAL;  Surgeon: Aletha Halim, MD;  Location: [facility];  Service: Gynecology;  Laterality: N/A;   Social History   Occupational History  . Occupation: DRIVER/CSR    Employer: [employer]  Tobacco Use  . Smoking status: Former Smoker    Last attempt to quit: 08/24/2012    Years since quitting: 5.8  . Smokeless tobacco: Never Used  Substance and Sexual Activity  . Alcohol use: Yes    Comment: occasionally  . Drug use: No  . Sexual activity: Yes    Birth control/protection: Surgical

## 2018-06-23 ENCOUNTER — Other Ambulatory Visit: Payer: Self-pay | Admitting: Pharmacist

## 2018-06-23 DIAGNOSIS — I10 Essential (primary) hypertension: Secondary | ICD-10-CM

## 2018-06-23 MED ORDER — PRAVASTATIN SODIUM 40 MG PO TABS: 40.0000 mg | ORAL_TABLET | Freq: Every day | ORAL | 0 refills | Status: DC

## 2018-06-23 MED ORDER — AMLODIPINE BESYLATE 10 MG PO TABS: 10.0000 mg | ORAL_TABLET | Freq: Every day | ORAL | 0 refills | Status: DC

## 2018-07-02 ENCOUNTER — Other Ambulatory Visit: Payer: Self-pay | Admitting: Family Medicine

## 2018-07-02 DIAGNOSIS — I10 Essential (primary) hypertension: Secondary | ICD-10-CM

## 2018-07-03 NOTE — Telephone Encounter (Signed)
Refill request

## 2018-07-04 NOTE — Telephone Encounter (Signed)
Refills are ready for pickup at the pharmacy.

## 2018-07-09 ENCOUNTER — Other Ambulatory Visit: Payer: Self-pay | Admitting: Family Medicine

## 2018-07-09 DIAGNOSIS — G8929 Other chronic pain: Secondary | ICD-10-CM

## 2018-07-09 DIAGNOSIS — M5440 Lumbago with sciatica, unspecified side: Principal | ICD-10-CM

## 2018-07-09 DIAGNOSIS — M545 Low back pain, unspecified: Secondary | ICD-10-CM

## 2018-07-09 DIAGNOSIS — I10 Essential (primary) hypertension: Secondary | ICD-10-CM

## 2018-07-09 DIAGNOSIS — M25552 Pain in left hip: Secondary | ICD-10-CM

## 2018-07-09 DIAGNOSIS — M5136 Other intervertebral disc degeneration, lumbar region: Secondary | ICD-10-CM

## 2018-07-10 MED ORDER — TRAMADOL HCL 50 MG PO TABS: 50.0000 mg | ORAL_TABLET | Freq: Three times a day (TID) | ORAL | 0 refills | Status: DC

## 2018-07-10 MED ORDER — PRAVASTATIN SODIUM 40 MG PO TABS: 40.0000 mg | ORAL_TABLET | Freq: Every day | ORAL | 0 refills | Status: DC

## 2018-07-10 MED ORDER — AMLODIPINE BESYLATE 10 MG PO TABS: 10.0000 mg | ORAL_TABLET | Freq: Every day | ORAL | 0 refills | Status: DC

## 2018-07-10 NOTE — Telephone Encounter (Signed)
Pt last seen: 06/14/18 Next appt: n/a Last RX written on: 05/15/18 Date of original fill: 05/16/18 Date of refill(s): n/a  No other controlled substances were filled during this time, please refill if appropriate.

## 2018-07-10 NOTE — Telephone Encounter (Signed)
Refill request

## 2018-07-10 NOTE — Telephone Encounter (Signed)
Patient was prescribed 30 day supply on 06/19/2018 and may refill of 07/20/2018

## 2018-07-11 MED FILL — traMADol HCL 50 MG TABS: 50 | 30 days supply | Qty: 90 | Fill #0

## 2018-07-11 MED FILL — DICYCLOMINE 10 MG CAPSULE: 10 | 15 days supply | Qty: 60 | Fill #3

## 2018-07-13 ENCOUNTER — Other Ambulatory Visit: Payer: Self-pay | Admitting: Family Medicine

## 2018-07-13 DIAGNOSIS — F411 Generalized anxiety disorder: Secondary | ICD-10-CM

## 2018-07-17 MED ORDER — CLONAZEPAM 0.5 MG PO TABS: ORAL_TABLET | ORAL | 0 refills | Status: DC

## 2018-07-17 MED FILL — CYCLOBENZAPRINE 10 MG TAB: 10 | 90 days supply | Qty: 90 | Fill #1

## 2018-07-17 MED FILL — ?OMEPRAZOLE 20 MG CAPSULE D: 20 | 30 days supply | Qty: 60 | Fill #8

## 2018-07-17 MED FILL — GABAPENTIN 300 MG CAPSULE: 300 | 30 days supply | Qty: 120 | Fill #6

## 2018-07-17 MED FILL — DULoxetine HCL 20 MG CPEP: 20 | 30 days supply | Qty: 30 | Fill #1

## 2018-07-17 MED FILL — ?METFORMIN HCL 500MG TABL: 500 | 30 days supply | Qty: 30 | Fill #5

## 2018-07-17 MED FILL — VENLAFAXINE HCL ER 150 MG C: 150 | 30 days supply | Qty: 30 | Fill #1

## 2018-07-17 MED FILL — FLUTICASONE PROP 50 MCG SPR: 50 | 30 days supply | Qty: 16 | Fill #2

## 2018-07-17 NOTE — Telephone Encounter (Signed)
Refill request

## 2018-07-24 ENCOUNTER — Other Ambulatory Visit: Payer: Self-pay | Admitting: Family Medicine

## 2018-07-24 DIAGNOSIS — I10 Essential (primary) hypertension: Secondary | ICD-10-CM

## 2018-07-24 MED ORDER — AMLODIPINE BESYLATE 10 MG PO TABS: 10.0000 mg | ORAL_TABLET | Freq: Every day | ORAL | 0 refills | Status: DC

## 2018-07-24 MED ORDER — PRAVASTATIN SODIUM 40 MG PO TABS: 40.0000 mg | ORAL_TABLET | Freq: Every day | ORAL | 0 refills | Status: DC

## 2018-07-24 MED FILL — ?AMLODIPINE BESYLATE 10 MG: 10 | 90 days supply | Qty: 90 | Fill #0

## 2018-07-24 MED FILL — ?PRAVASTATIN NA 40 MG TAB: 40 | 90 days supply | Qty: 90 | Fill #0

## 2018-07-24 NOTE — Telephone Encounter (Signed)
Refill request

## 2018-08-04 ENCOUNTER — Other Ambulatory Visit: Payer: Self-pay | Admitting: Family Medicine

## 2018-08-04 DIAGNOSIS — F411 Generalized anxiety disorder: Secondary | ICD-10-CM

## 2018-08-07 ENCOUNTER — Other Ambulatory Visit: Payer: Self-pay | Admitting: Family Medicine

## 2018-08-07 DIAGNOSIS — F411 Generalized anxiety disorder: Secondary | ICD-10-CM

## 2018-08-07 NOTE — Telephone Encounter (Signed)
Refill request

## 2018-08-08 NOTE — Telephone Encounter (Signed)
RX request received for clonazepam refill and first request denied as patient needs an office visit. Second request received and refill given but the need for office visit prior to future refills placed on patient sig.

## 2018-08-09 MED FILL — clonazePAM 0.5 MG TABS: 0.5 | 30 days supply | Qty: 15 | Fill #0

## 2018-08-17 ENCOUNTER — Telehealth: Payer: No Typology Code available for payment source | Admitting: Physician Assistant

## 2018-08-17 DIAGNOSIS — M545 Low back pain, unspecified: Secondary | ICD-10-CM

## 2018-08-17 DIAGNOSIS — R300 Dysuria: Secondary | ICD-10-CM

## 2018-08-17 NOTE — Progress Notes (Signed)
Based on what you shared with me, I feel your condition warrants further evaluation and I recommend that you be seen for a face to face office visit.  Giving the back pain along with difficulty with urination you will need to be seen in order to assess if the urinary symptoms are stemming from nerve irritation in the spine, or if back pain is from a more complicate urinary tract infection which requires assessment of urine,etc.    NOTE: If you entered your credit card information for this eVisit, you will not be charged. You may see a "hold" on your card for the $35 but that hold will drop off and you will not have a charge processed.  If you are having a true medical emergency please call 911.  If you need an urgent face to face visit, [facility] has four urgent care centers for your convenience.    PLEASE NOTE: THE [facility] LOCATIONS AND URGENT CARE CLINICS DO NOT HAVE THE TESTING FOR CORONAVIRUS COVID19 AVAILABLE.  IF YOU FEEL YOU NEED THIS TEST YOU MUST GO TO A TRIAGE LOCATION AT [facility]   [email or web address] to reserve your spot online an avoid wait times  [facility] [address], [address] [place], [place] [address] Modified hours of operation: Monday-Friday, 10 AM to 6 PM  Saturday & Sunday 10 AM to 4 PM *Across the street from [place] (New Address!) [address] [place], [place] [address] *Just off [address], across the road from [place] hours of operation: Monday-Friday, 10 AM to 5 PM  Closed Saturday & Sunday   The following sites will take your insurance:  . [facility] [place] a Provider at this Location  [address] [place], [place] [address] . 10 am to 8 pm Monday-Friday . 12 pm to 8 pm Saturday-Sunday   . [facility] at [place] a Provider at this Location  [address] [place], [place], [place] [address] . 8 am to 8 pm Monday-Friday . 9 am to 6 pm Saturday . 11 am to 6 pm Sunday   . [facility] at [place] Get Driving Directions  [address] [place], [place] [address] . 8 am to 8 pm Monday-Friday . 8 am to 4 pm Saturday-Sunday   Your e-visit answers were reviewed by a board certified advanced clinical practitioner to complete your personal care plan.  Thank you for using e-Visits.

## 2018-08-18 ENCOUNTER — Ambulatory Visit
Admission: EM | Admit: 2018-08-18 | Discharge: 2018-08-18 | Disposition: A | Discharge status: Home / Self Care | Payer: No Typology Code available for payment source | Attending: Emergency Medicine | Admitting: Emergency Medicine

## 2018-08-18 ENCOUNTER — Other Ambulatory Visit: Payer: Self-pay

## 2018-08-18 DIAGNOSIS — N390 Urinary tract infection, site not specified: Secondary | ICD-10-CM | POA: Insufficient documentation

## 2018-08-18 DIAGNOSIS — B9689 Other specified bacterial agents as the cause of diseases classified elsewhere: Secondary | ICD-10-CM

## 2018-08-18 LAB — POCT URINALYSIS DIP (MANUAL ENTRY)
Bilirubin, UA: NEGATIVE
Glucose, UA: 100 mg/dL — AB
Ketones, POC UA: NEGATIVE mg/dL
Nitrite, UA: POSITIVE — AB
Protein Ur, POC: NEGATIVE mg/dL
Spec Grav, UA: <=1.005 — AB (ref 1.010–1.025)
Urobilinogen, UA: 1 E.U./dL
pH, UA: 6 (ref 5.0–8.0)

## 2018-08-18 MED ORDER — NITROFURANTOIN MONOHYD MACRO 100 MG PO CAPS: 100.0000 mg | ORAL_CAPSULE | Freq: Two times a day (BID) | ORAL | 0 refills | Status: AC

## 2018-08-18 NOTE — ED Provider Notes (Signed)
[facility]    CSN: [id number] Arrival date & time: 08/18/18  1609     History   Chief Complaint Chief Complaint  Patient presents with   Urinary Tract Infection    HPI Hazle Ogburn is a 48 y.o. female.   [name] presents with complaints of cloudy urine, pain with urination, small and frequent urination. Started three days ago. Hx of UTI in the past which felt similar. Low back aches. No blood in urine. No fevers. Mild pelvic tenderness. No vaginal symptoms. Took 1 dose of AZO today which minimally helped. No nausea, vomiting or diarrhea. No vaginal symptoms. Hx of hysterectomy. Hx of depression, fibromyalgia, gerd, htn, ibs, OA, PVD.    ROS per HPI, negative if not otherwise mentioned.      Past Medical History:  Diagnosis Date   Anemia 11/24/2017   Anxiety    Bone spur    cervical spine   Chronic kidney disease 04/2017   kidney infections   Depression    Deviated nasal septum    states is unable to lie flat, because her nose will become congested and she can stop breathing   Dysrhythmia    heart flutters occasionally   Fibromyalgia    GERD (gastroesophageal reflux disease)    Hallux abductovalgus with bunions 09/2014   right    Hammertoe 09/2014   right 4th, 5th   History of stomach ulcers    Hypertension    states is under control with med., has been on med. x 8 mos.   IBS (irritable bowel syndrome)    no current med.   Osteoarthritis    hands, bilateral hips   Peripheral vascular disease ([place])    occasional swelling in both legs   Sinus headache     Patient Active Problem List   Diagnosis Date Noted   Protrusion of cervical intervertebral disc 03/22/2018   Anemia 11/24/2017   Pre-diabetes 11/24/2017   Blurry vision, bilateral 05/18/2017   Callus of foot 05/18/2017   Pain in joint of left hip 02/16/2017   Bruises easily 11/10/2016   Gastroesophageal reflux disease without esophagitis 09/22/2016     Fatigue associated with anemia 09/22/2016   Flexion contractures 09/22/2016   Generalized anxiety disorder 01/01/2016   Dental abscess 01/01/2016   Healthcare maintenance 05/08/2015   Adjustment disorder with mixed anxiety and depressed mood 05/08/2015   Stress at home 10/24/2014   Irritable bowel syndrome 10/24/2014   Anxiety state 10/24/2014   Preop testing 09/19/2014   Pap smear for cervical cancer screening 09/19/2014   Midline low back pain without sciatica 08/12/2014   [place] nodes 08/12/2014   Contracture of joint, hand 08/12/2014   Essential hypertension 02/11/2014   Allergy 02/11/2014   Screening for breast cancer 02/11/2014   Dupuytren's contracture of both hands 11/15/2013   Hallux valgus of right foot 11/15/2013   Back pain 05/21/2013   UTI (urinary tract infection) 05/21/2013   Multiple skin nodules 11/27/2012   Fibromyalgia 09/08/2012   Osteoarthritis 09/08/2012   Neck muscle spasm 07/14/2012   Plantar wart 07/14/2012   Insomnia 07/14/2012    Past Surgical History:  Procedure Laterality Date   ABDOMINAL HYSTERECTOMY     BUNIONECTOMY Right 10/04/2014   Procedure:  Gareth Morgan;  Surgeon: Trula Slade, DPM;  Location: [facility];  Service: Podiatry;  Laterality: Right;   COLONOSCOPY WITH PROPOFOL  02/13/2013   CYSTOSCOPY N/A 07/07/2017   Procedure: CYSTOSCOPY;  Surgeon: Ilda Basset,  Eduard Clos, MD;  Location: [facility];  Service: Gynecology;  Laterality: N/A;   DUPUYTREN / PALMAR FASCIOTOMY Right 07/18/2013   exc. 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   small finger   HAMMER TOE SURGERY Right 10/04/2014   Procedure: HAMMER TOE REPAIR 4TH  AND 5TH  RIGHT FOOT  fourth toe fixation with k wire;  Surgeon: Trula Slade, DPM;  Location: [facility];  Service: Podiatry;  Laterality: Right;   LEG SURGERY Left    as a child; near amputation of leg    TUBAL LIGATION     VAGINAL HYSTERECTOMY N/A 07/07/2017   Procedure: HYSTERECTOMY VAGINAL;  Surgeon: Aletha Halim, MD;  Location: [facility];  Service: Gynecology;  Laterality: N/A;    OB History    Gravida  3   Para  3   Term  3   Preterm  0   AB  0   Living  3     SAB  0   TAB  0   Ectopic  0   Multiple  0   Live Births           Obstetric Comments  SVD x 3         Home Medications    Prior to Admission medications   Medication Sig Start Date End Date Taking? Authorizing Provider  amLODipine (NORVASC) 10 MG tablet Take 1 tablet (10 mg total) by mouth daily. 07/24/18   Fulp, Cammie, MD  cephALEXin (KEFLEX) 500 MG capsule Take 1 capsule (500 mg total) by mouth 4 (four) times daily. 06/18/18   Larene Pickett, PA-C  clonazePAM (KLONOPIN) 0.5 MG tablet TAKE 1/2 TABLET BY MOUTH ONCE A DAY AS NEEDED FOR ACUTE ANXIETY-Office visit needed prior to future refills 08/08/18   Fulp, Cammie, MD  cyclobenzaprine (FLEXERIL) 10 MG tablet Take 1 tablet (10 mg total) by mouth 3 (three) times daily as needed for muscle spasms. 04/13/18   Fulp, Cammie, MD  diclofenac sodium (VOLTAREN) 1 % GEL Apply 4 g topically 4 (four) times daily. 02/09/17   Tresa Garter, MD  dicyclomine (BENTYL) 10 MG capsule TAKE 1 CAPSULE BY MOUTH 4 TIMES DAILY AS NEEDED FOR SPASMS. 12/26/17   Fulp, Cammie, MD  diphenhydrAMINE (BENADRYL) 25 MG tablet Take 25 mg by mouth daily as needed for allergies.    [provider]  DULoxetine (CYMBALTA) 20 MG capsule TAKE 1 CAPSULE BY MOUTH DAILY. 06/20/18   Fulp, Cammie, MD  ferrous gluconate (FERGON) 324 MG tablet Take 1 tablet (324 mg total) by mouth daily with breakfast. Patient not taking: Reported on 06/14/2018 07/08/17   Aletha Halim, MD  fluticasone (FLONASE) 50 MCG/ACT nasal spray PLACE 2 SPRAYS INTO BOTH NOSTRILS DAILY. 05/12/18   Fulp, Cammie, MD  gabapentin (NEURONTIN) 300 MG capsule One pill twice daily and 2 pills at bedtime 11/24/17   Fulp,  Cammie, MD  hydrOXYzine (ATARAX/VISTARIL) 25 MG tablet Take 1 tablet (25 mg total) by mouth 2 (two) times daily as needed for anxiety. Patient not taking: Reported on 06/14/2018 09/22/17   Ladell Pier, MD  ibuprofen (ADVIL,MOTRIN) 600 MG tablet TAKE 1 TABLET (600 MG TOTAL) BY MOUTH EVERY 8 (EIGHT) HOURS AS NEEDED FOR MODERATE PAIN. TAKE AFTER EATING 03/13/18   Fulp, Cammie, MD  meloxicam (MOBIC) 15 MG tablet Take 1 tablet (15 mg total) by mouth daily. 09/22/17   Ladell Pier, MD  metFORMIN (GLUCOPHAGE) 500 MG tablet Take 1 tablet (500 mg  total) by mouth daily with breakfast. 12/26/17   Fulp, Cammie, MD  nitrofurantoin, macrocrystal-monohydrate, (MACROBID) 100 MG capsule Take 1 capsule (100 mg total) by mouth 2 (two) times daily for 5 days. 08/18/18 08/23/18  Zigmund Gottron, NP  omeprazole (PRILOSEC) 20 MG capsule Take 2 capsules (40 mg total) by mouth daily. 10/19/17   Ladell Pier, MD  ondansetron (ZOFRAN-ODT) 8 MG disintegrating tablet Take 1 tablet (8 mg total) by mouth daily as needed for nausea or vomiting. 05/16/18   Fulp, Cammie, MD  pravastatin (PRAVACHOL) 40 MG tablet Take 1 tablet (40 mg total) by mouth daily. Needs to be seen by PCP for more refills. 07/24/18   Fulp, Cammie, MD  traMADol (ULTRAM) 50 MG tablet Take 1 tablet (50 mg total) by mouth 3 (three) times daily. As needed for pain. 30 day supply (last filled 06/20/2018) 07/10/18   Fulp, Cammie, MD  venlafaxine XR (EFFEXOR-XR) 150 MG 24 hr capsule TAKE 1 CAPSULE BY MOUTH DAILY WITH BREAKFAST. 06/20/18   Fulp, Cammie, MD    Family History Family History  Problem Relation Age of Onset   Hypertension Mother    Diabetes Mother    Heart disease Mother    Hypertension Father    Diabetes Father    Prostate cancer Father    Heart disease Father    Alcohol abuse Father    Hypertension Sister    Diabetes Sister    Heart disease Maternal Grandmother        Great GM   Breast cancer Maternal Grandmother    Bone cancer  Maternal Grandmother    Breast cancer Maternal Aunt    Ovarian cancer Maternal Aunt    Colon cancer Maternal Aunt        great aunt   Rheum arthritis Sister    Colon cancer Son     Social History Social History   Tobacco Use   Smoking status: Former Smoker    Last attempt to quit: 08/24/2012    Years since quitting: 5.9   Smokeless tobacco: Never Used  Substance Use Topics   Alcohol use: Yes    Comment: occasionally   Drug use: No     Allergies   Patient has no known allergies.   Review of Systems Review of Systems   Physical Exam Triage Vital Signs ED Triage Vitals [08/18/18 1616]  Enc Vitals Group     BP 118/78     Pulse Rate 86     Resp 18     Temp 98 F (36.7 C)     Temp Source Oral     SpO2 98 %     Weight      Height      Head Circumference      Peak Flow      Pain Score      Pain Loc      Pain Edu?      Excl. in [place]?    No data found.  Updated Vital Signs BP 116/77 (BP Location: Left Arm)    Pulse 96    Temp 98 F (36.7 C) (Oral)    Resp 18    LMP 07/04/2017    SpO2 96%    Physical Exam Constitutional:      General: She is not in acute distress.    Appearance: She is well-developed.  Cardiovascular:     Rate and Rhythm: Normal rate and regular rhythm.     Heart sounds: Normal heart sounds.  Pulmonary:  Effort: Pulmonary effort is normal.     Breath sounds: Normal breath sounds.  Abdominal:     Tenderness: There is no abdominal tenderness. There is no right CVA tenderness or left CVA tenderness.  Skin:    General: Skin is warm and dry.  Neurological:     Mental Status: She is alert and oriented to person, place, and time.      UC Treatments / Results  Labs (all labs ordered are listed, but only abnormal results are displayed) Labs Reviewed  POCT URINALYSIS DIP (MANUAL ENTRY) - Abnormal; Notable for the following components:      Result Value   Color, UA orange (*)    Glucose, UA =100 (*)    Spec Grav, UA <=1.005 (*)     Blood, UA trace-lysed (*)    Nitrite, UA Positive (*)    Leukocytes, UA Trace (*)    All other components within normal limits  URINE CULTURE    EKG None  Radiology No results found.  Procedures Procedures (including critical care time)  Medications Ordered in UC Medications - No data to display  Initial Impression / Assessment and Plan / UC Course  I have reviewed the triage vital signs and the nursing notes.  Pertinent labs & imaging results that were available during my care of the patient were reviewed by me and considered in my medical decision making (see chart for details).     H&p and urine dip concerning for UTI. macrobid sensitivity with most recent culture. Culture obtained and pending. Will notify of any positive findings and if any changes to treatment are needed.  Return precautions provided. If symptoms worsen or do not improve in the next week to return to be seen or to follow up with PCP.  Patient verbalized understanding and agreeable to plan.   Final Clinical Impressions(s) / UC Diagnoses   Final diagnoses:  Lower urinary tract infection     Discharge Instructions     Complete course of antibiotics.  Drink plenty of water to empty bladder regularly. Avoid alcohol and caffeine as these may irritate the bladder.  If symptoms worsen or do not improve in the next week to return to be seen or to follow up with your PCP.     ED Prescriptions    Medication Sig Dispense Auth. Provider   nitrofurantoin, macrocrystal-monohydrate, (MACROBID) 100 MG capsule Take 1 capsule (100 mg total) by mouth 2 (two) times daily for 5 days. 10 capsule Zigmund Gottron, NP     Controlled Substance Prescriptions [place] Controlled Substance Registry consulted? Not Applicable   Zigmund Gottron, NP 08/18/18 1650

## 2018-08-18 NOTE — ED Triage Notes (Signed)
Pt c/o urinary burning, frequency, and lower back pain x2 days

## 2018-08-18 NOTE — Discharge Instructions (Signed)
Complete course of antibiotics.  Drink plenty of water to empty bladder regularly. Avoid alcohol and caffeine as these may irritate the bladder.   If symptoms worsen or do not improve in the next week to return to be seen or to follow up with your PCP.   

## 2018-08-21 ENCOUNTER — Other Ambulatory Visit: Payer: Self-pay | Admitting: Family Medicine

## 2018-08-21 ENCOUNTER — Telehealth (HOSPITAL_COMMUNITY): Payer: Self-pay | Admitting: Emergency Medicine

## 2018-08-21 DIAGNOSIS — F411 Generalized anxiety disorder: Secondary | ICD-10-CM

## 2018-08-21 DIAGNOSIS — M797 Fibromyalgia: Secondary | ICD-10-CM

## 2018-08-21 LAB — URINE CULTURE: Culture: 20000 — AB

## 2018-08-21 MED FILL — VENLAFAXINE HCL ER 150 MG C: 150 | 30 days supply | Qty: 30 | Fill #2

## 2018-08-21 MED FILL — ONDANSETRON ODT 8 MG TABLET: 8 | 20 days supply | Qty: 20 | Fill #1

## 2018-08-21 MED FILL — traMADol HCL 50 MG TABS: 50 | 30 days supply | Qty: 90 | Fill #0

## 2018-08-21 MED FILL — ?METFORMIN HCL 500MG TABLET: 500 | 30 days supply | Qty: 30 | Fill #6

## 2018-08-21 MED FILL — ?OMEPRAZOLE 20 MG CAPSULE D: 20 | 30 days supply | Qty: 60 | Fill #9

## 2018-08-21 MED FILL — DULoxetine HCL 20 MG CPEP: 20 | 30 days supply | Qty: 30 | Fill #2

## 2018-08-21 NOTE — Telephone Encounter (Signed)
Treated with Macrobid, Patient contacted and made aware of all results, all questions answered.

## 2018-08-22 MED FILL — CYCLOBENZAPRINE 10 MG TAB: 10 | 30 days supply | Qty: 30 | Fill #3

## 2018-08-22 MED FILL — GABAPENTIN 300 MG CAPSULE: 300 | 30 days supply | Qty: 120 | Fill #0

## 2018-08-23 MED FILL — FLUTICASONE PROP 50 MCG SPR: 50 | 30 days supply | Qty: 16 | Fill #0

## 2018-09-03 ENCOUNTER — Encounter: Payer: Self-pay | Admitting: Family Medicine

## 2018-09-03 ENCOUNTER — Other Ambulatory Visit: Payer: Self-pay | Admitting: Family Medicine

## 2018-09-03 DIAGNOSIS — F411 Generalized anxiety disorder: Secondary | ICD-10-CM

## 2018-09-04 NOTE — Telephone Encounter (Signed)
OV needed prior to continued refills

## 2018-09-04 NOTE — Telephone Encounter (Signed)
Patient request for refill and referral

## 2018-09-05 ENCOUNTER — Other Ambulatory Visit: Payer: Self-pay | Admitting: Family Medicine

## 2018-09-05 DIAGNOSIS — F411 Generalized anxiety disorder: Secondary | ICD-10-CM

## 2018-09-05 NOTE — Telephone Encounter (Signed)
Patient has been scheduled for a televisit with her PCP for 09/06/18.

## 2018-09-05 NOTE — Progress Notes (Signed)
Patient ID: Isabel Evans, female   DOB: 09-11-1970, 48 y.o.   MRN: [id number]   My chart message received that patient would like a referral to mental health due to ongoing issues with anxiety. . She apparently did not answer or return calls to [facility] earlier this year. New referral will be placed.

## 2018-09-06 ENCOUNTER — Other Ambulatory Visit: Payer: Self-pay

## 2018-09-06 ENCOUNTER — Telehealth: Payer: Self-pay

## 2018-09-06 ENCOUNTER — Encounter: Payer: Self-pay | Admitting: Family Medicine

## 2018-09-06 ENCOUNTER — Ambulatory Visit: Payer: Self-pay | Attending: Family Medicine | Admitting: Family Medicine

## 2018-09-06 DIAGNOSIS — N3000 Acute cystitis without hematuria: Secondary | ICD-10-CM

## 2018-09-06 DIAGNOSIS — F411 Generalized anxiety disorder: Secondary | ICD-10-CM

## 2018-09-06 DIAGNOSIS — F331 Major depressive disorder, recurrent, moderate: Secondary | ICD-10-CM

## 2018-09-06 DIAGNOSIS — M720 Palmar fascial fibromatosis [Dupuytren]: Secondary | ICD-10-CM

## 2018-09-06 MED ORDER — CLONAZEPAM 0.5 MG PO TABS: ORAL_TABLET | ORAL | 0 refills | Status: DC

## 2018-09-06 MED ORDER — CIPROFLOXACIN HCL 500 MG PO TABS: 500.0000 mg | ORAL_TABLET | Freq: Two times a day (BID) | ORAL | 0 refills | Status: AC

## 2018-09-06 MED FILL — clonazePAM 0.5 MG TABS: 0.5 | 30 days supply | Qty: 60 | Fill #0

## 2018-09-06 MED FILL — CIPROFLOXACIN HCL 500 MG TA: 500 | 5 days supply | Qty: 10 | Fill #0

## 2018-09-06 NOTE — Telephone Encounter (Signed)
As per Maria Ramirez Perez, [facility], the patient's referral is now closed.  

## 2018-09-06 NOTE — Progress Notes (Signed)
Per pt she is trying to get her Med refills but also fill her Klonopin. Patient interested in getting with psychiatrist to manage her medications.

## 2018-09-06 NOTE — Progress Notes (Signed)
Virtual Visit via Telephone Note  I connected with Isabel Evans on 09/06/18 at  3:30 PM EDT by telephone and verified that I am speaking with the correct person using two identifiers.   I discussed the limitations, risks, security and privacy concerns of performing an evaluation and management service by telephone and the availability of in person appointments. I also discussed with the patient that there may be a patient responsible charge related to this service. The patient expressed understanding and agreed to proceed.  Patient Location: Home Provider Location: Office  Others participating in call: call initiated by Emilio Aspen, RMA   History of Present Illness:      48 yo female with the complaint of chronic anxiety which is recently increased secondary to concerns over COVID-19.  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  She worries that with her underlying health issues that she may contract COVID-19.  Patient however states that she cannot afford not to work.  She states that she has taken all of her Klonopin that was recently prescribed.  Patient states that the dose was really small and she only received a limited number of pills.  Patient reports that she would like to see a psychiatrist for further evaluation.  She has recently started seeing a Social worker.  She does continue to take Cymbalta daily.  She also takes the Effexor and she did initially noticed that this medication was helpful with her mood but more so she feels that the medication stopped her menopausal symptoms of hot flashes.  She denies any issues with suicidal thoughts or ideations.      She reports that she feels as if she still has symptoms of urinary tract infection for which she was seen in the emergency department at the beginning of May.  She states that when she took the prescribed antibiotics her symptoms lessened somewhat but never  fully resolved.  She reports that she continues to have frequent urination and has had some issues with bladder leakage when she was not able to get to the restroom in time when she suddenly had the urge to urinate while at work.  She continues to have discomfort with urination as well.  She has some low back pain but has had chronic issues with back pain.  She denies any nausea/vomiting.  No fever chills but she states that she is very fatigued and she believes that this may be due to a continued bladder infection.        She reports that she has continued pain and decreased function/decreased ability to use her hands due to the Dupuytren contractures.  She reports that she did follow-up with orthopedics but she states that she was told by her orthopedic doctor that he could not offer any additional services and that she would need to follow-up with an actual hand surgeon/hand specialist and patient would like to have a referral placed.  Patient states that her hand pain is generally always a least a 5 and it becomes worse with use of her hands.  Pain is sometimes dull and aching but sometimes sharp.                  Past Medical History:  Diagnosis Date  . Anemia 11/24/2017  . Anxiety   . Bone spur    cervical spine  . Chronic kidney disease 04/2017   kidney infections  . Depression   .  Deviated nasal septum    states is unable to lie flat, because her nose will become congested and she can stop breathing  . Dysrhythmia    heart flutters occasionally  . Fibromyalgia   . GERD (gastroesophageal reflux disease)   . Hallux abductovalgus with bunions 09/2014   right   . Hammertoe 09/2014   right 4th, 5th  . History of stomach ulcers   . Hypertension    states is under control with med., has been on med. x 8 mos.  . IBS (irritable bowel syndrome)    no current med.  . Osteoarthritis    hands, bilateral hips  . Peripheral vascular disease ([facility])    occasional swelling in both legs  . Sinus  headache     Past Surgical History:  Procedure Laterality Date  . ABDOMINAL HYSTERECTOMY    . BUNIONECTOMY Right 10/04/2014   Procedure:  Altamese Wheaton, Emmaline Life;  Surgeon: Trula Slade, DPM;  Location: [facility];  Service: Podiatry;  Laterality: Right;  . COLONOSCOPY WITH PROPOFOL  02/13/2013  . CYSTOSCOPY N/A 07/07/2017   Procedure: CYSTOSCOPY;  Surgeon: Aletha Halim, MD;  Location: [facility];  Service: Gynecology;  Laterality: N/A;  . DUPUYTREN / PALMAR FASCIOTOMY Right 07/18/2013   exc. of multiple nodules  . 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 HAMMER TOE SURGERY Right 10/04/2014   Procedure: HAMMER TOE REPAIR 4TH  AND 5TH  RIGHT FOOT  fourth toe fixation with k wire;  Surgeon: Trula Slade, DPM;  Location: [facility];  Service: Podiatry;  Laterality: Right;  . 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 TUBAL LIGATION    . VAGINAL HYSTERECTOMY N/A 07/07/2017   Procedure: HYSTERECTOMY VAGINAL;  Surgeon: Aletha Halim, MD;  Location: [facility];  Service: Gynecology;  Laterality: N/A;    Family History  Problem Relation Age of Onset  . Hypertension Mother   . Diabetes Mother   . Heart disease Mother   . Hypertension Father   . Diabetes Father   . Prostate cancer Father   . Heart disease Father   . Alcohol abuse Father   . Hypertension Sister   . Diabetes Sister   . Heart disease Maternal Grandmother        Great GM  . Breast cancer Maternal Grandmother   . Bone cancer Maternal Grandmother   . Breast cancer Maternal Aunt   . Ovarian cancer Maternal Aunt   . Colon cancer Maternal Aunt        great aunt  . Rheum arthritis Sister   . Colon cancer Son     Social History   Tobacco Use  . Smoking status: Former Smoker    Types: Cigarettes    Last attempt to quit: 08/24/2012    Years since quitting: 6.0  . Smokeless tobacco: Never Used  Substance Use Topics  . Alcohol use: Yes      Comment: occasionally  . Drug use: No     No Known Allergies     Observations/Objective: No vital signs or physical exam conducted as visit was done via telephone due to current restrictions/limitations on in-office visits secondary to the current COVID-19 pandemic  Assessment and Plan: 1. Generalized anxiety disorder; 3.  Moderate episode of recurrent major depressive disorder Discussed with patient that prior referral had been placed for her to be seen by mental health and of her the mental health  practice to which she was referred, she was contacted x3 and did not return the call regarding an appointment.  New referral will be placed for referral and hopefully patient will be able to be seen within the next 30 days but discussed with patient that is very important that she return any phone calls that her left for her regarding appointments.  Patient is given refill of clonazepam to help with increased anxiety as well as her chronic issues with anxiety.  She will also continue the use of Cymbalta and venlafaxine but she is aware that these may be adjusted or changed to different medications when she is seen by psychiatry. - clonazePAM (KLONOPIN) 0.5 MG tablet; 1/2 or 1 tablet twice daily as needed for anxiety; 30 day supply  Dispense: 60 tablet; Refill: 0  2. Dupuytren's contracture of both hands Patient is being referred to a hand specialist secondary to bilateral Dupuytren's contractures of the hands which continue to worsen. - Ambulatory referral to Hand Surgery  4. Acute cystitis without hematuria Patient has been seen recently at the emergency department on 08/18/2018 and was diagnosed with acute cystitis but feels that her symptoms have never fully resolved.  Based on urine culture results from her emergency department visit, she will be placed on Cipro 500 mg twice daily for 5 days and is encouraged to increase water intake.  She is encouraged to call or return to clinic within the  next 1 to 2 weeks if her symptoms do not improve.  If she has any acute worsening of symptoms, urgent care or emergency department follow-up is recommended. - ciprofloxacin (CIPRO) 500 MG tablet; Take 1 tablet (500 mg total) by mouth 2 (two) times daily for 5 days.  Dispense: 10 tablet; Refill: 0   Follow Up Instructions:Return in about 4 weeks (around 10/04/2018) for chronic issues-sooner if continued urinary issues.    I discussed the assessment and treatment plan with the patient. The patient was provided an opportunity to ask questions and all were answered. The patient agreed with the plan and demonstrated an understanding of the instructions.   The patient was advised to call back or seek an in-person evaluation if the symptoms worsen or if the condition fails to improve as anticipated.  I provided 13  minutes of non-face-to-face time during this encounter.   Antony Blackbird, MD

## 2018-09-08 ENCOUNTER — Encounter: Payer: Self-pay | Admitting: Family Medicine

## 2018-09-13 NOTE — Telephone Encounter (Signed)
Called [phone] and [facility] for [facility] to please call patient to sch per the ref that was placed 09-05-2018. Office number was provided on voicemail and patient MRN number was also provided on voicemail.

## 2018-09-21 ENCOUNTER — Telehealth: Payer: Self-pay | Admitting: Family Medicine

## 2018-09-21 NOTE — Telephone Encounter (Signed)
Called and spoke with patient and informed her with what provider stated. Patient verbalized understanding and agreed with advise.

## 2018-09-21 NOTE — Telephone Encounter (Signed)
Patient called stating she has been having fever,lower back pain, and urinary issues. States these symptoms have been recurring.  Please follow up.

## 2018-09-21 NOTE — Telephone Encounter (Signed)
Advised to go to the ED or UC

## 2018-09-22 ENCOUNTER — Other Ambulatory Visit: Payer: Self-pay

## 2018-09-22 ENCOUNTER — Encounter: Payer: Self-pay | Admitting: Primary Care

## 2018-09-22 ENCOUNTER — Ambulatory Visit: Payer: Self-pay | Attending: Primary Care | Admitting: Primary Care

## 2018-09-22 DIAGNOSIS — F329 Major depressive disorder, single episode, unspecified: Secondary | ICD-10-CM

## 2018-09-22 DIAGNOSIS — F419 Anxiety disorder, unspecified: Secondary | ICD-10-CM

## 2018-09-22 DIAGNOSIS — F32A Depression, unspecified: Secondary | ICD-10-CM

## 2018-09-22 DIAGNOSIS — Z7189 Other specified counseling: Secondary | ICD-10-CM

## 2018-09-22 DIAGNOSIS — R509 Fever, unspecified: Secondary | ICD-10-CM

## 2018-09-22 DIAGNOSIS — M797 Fibromyalgia: Secondary | ICD-10-CM

## 2018-09-22 MED ORDER — DULOXETINE HCL 20 MG PO CPEP: 20.0000 mg | ORAL_CAPSULE | Freq: Every day | ORAL | 2 refills | Status: DC

## 2018-09-22 MED FILL — ?DULOXETINE HCL 20MG CPE: 20 | 30 days supply | Qty: 30 | Fill #0

## 2018-09-22 NOTE — Progress Notes (Signed)
Virtual Visit via Telephone Note  I connected with Isabel Evans on 09/22/18 at  9:10 AM EDT by telephone and verified that I am speaking with the correct person using two identifiers.   I discussed the limitations, risks, security and privacy concerns of performing an evaluation and management service by telephone and the availability of in person appointments. I also discussed with the patient that there may be a patient responsible charge related to this service. The patient expressed understanding and agreed to proceed.   History of Present Illness: Isabel Evans is being seen via tele she states she has had a fever of 100.4 for the last 2 days but takes Tylenol Q4 when it spikes back up. Feeling fatigue , chills , sore throat , and head aches. Sounded congested on the phone.   Observations/Objective: Review of Systems  Constitutional: Positive for chills, fever and malaise/fatigue.       100.4 taking tylenol  2 days  HENT: Positive for sore throat.   Eyes: Negative.        Floater  Respiratory: Positive for cough.   Cardiovascular: Negative.   Gastrointestinal: Positive for constipation.  Genitourinary: Positive for frequency.  Musculoskeletal: Positive for back pain, myalgias and neck pain.  Skin: Negative.   Neurological: Positive for weakness and headaches.  Psychiatric/Behavioral: Positive for depression.    Assessment and Plan: Isabel Evans was seen today for back pain.  Diagnoses and all orders for this visit:  Advice Given About Covid-19 Virus by Telephone Coronavirus (COVID-19) Are you at risk?  Are you at risk for the Coronavirus (COVID-19)?  To be considered HIGH RISK for Coronavirus (COVID-19), you have to meet the following criteria:  . Traveled to [place], [place], [place], [place] or [place]; or in the [place] to [place], [place], [place], or [place]; and have fever, cough, and shortness of breath within the last 2 weeks of travel OR . Been in close  contact with a person diagnosed with COVID-19 within the last 2 weeks and have fever, cough, and shortness of breath . IF YOU DO NOT MEET THESE CRITERIA, YOU ARE CONSIDERED LOW RISK FOR COVID-19.  What to do if you are HIGH RISK for COVID-19?  Marland Kitchen If you are having a medical emergency, call 911. . Seek medical care right away. Before you go to a doctor's office, urgent care or emergency department, call ahead and tell them about your recent travel, contact with someone diagnosed with COVID-19, and your symptoms. You should receive instructions from your physician's office regarding next steps of care.  . When you arrive at healthcare provider, tell the healthcare staff immediately you have returned from visiting [place], [place], [place], [place] or [place]; or traveled in the [place] to [place], [place], [place], or [place]; in the last two weeks or you have been in close contact with a person diagnosed with COVID-19 in the last 2 weeks.   . Tell the health care staff about your symptoms: fever, cough and shortness of breath. . After you have been seen by a medical provider, you will be either: o Tested for (COVID-19) and discharged home on quarantine except to seek medical care if symptoms worsen, and asked to  - Stay home and avoid contact with others until you get your results (4-5 days)  - Avoid travel on public transportation if possible (such as bus, train, or airplane) or o Sent to the Emergency Department by EMS for evaluation, COVID-19 testing, and possible  admission depending on your condition and test results.  What to do if you are LOW RISK for COVID-19?  Reduce your risk of any infection by using the same precautions used for avoiding the common cold or flu:  Marland Kitchen Wash your hands often with soap and warm water for at least 20 seconds.  If soap and water are not readily available, use an alcohol-based hand sanitizer with at least 60% alcohol.  . If coughing or sneezing, cover  your mouth and nose by coughing or sneezing into the elbow areas of your shirt or coat, into a tissue or into your sleeve (not your hands). . Avoid shaking hands with others and consider head nods or verbal greetings only. . Avoid touching your eyes, nose, or mouth with unwashed hands.  . Avoid close contact with people who are sick. . Avoid places or events with large numbers of people in one location, like concerts or sporting events. . Carefully consider travel plans you have or are making. . If you are planning any travel outside or inside the [place], visit the CDC's Travelers' Health webpage for the latest health notices. . If you have some symptoms but not all symptoms, continue to monitor at home and seek medical attention if your symptoms worsen. . If you are having a medical emergency, call 911.   [facility] / e-Visit: [email or web address]         [facility]: [phone]  [facility]: [phone]                   [facility]: [phone]  Ref[facility] her to [facility] where they are performing COVID testing  Fever, unspecified fever cause Questionable exposure to COVID she works at [employer]. I did not ask her did she wear mask or behind a plexy glass  Depression, unspecified depression type On Cymbalta 60mg  daily increased especially worrying about why she feels so sick  Anxiety Medication recently increased by PCP and increased anxiety about s/s, 8 hour restrictions for home , work.     Follow Up Instructions:    I discussed the assessment and treatment plan with the patient. The patient was provided an opportunity to ask questions and all were answered. The patient agreed with the plan and demonstrated an understanding of the instructions.   The patient was advised to call back or seek an in-person evaluation if the symptoms worsen  or if the condition fails to improve as anticipated.  I provided 15 minutes of non-face-to-face time during this encounter.   Kerin Perna, NP

## 2018-09-22 NOTE — Progress Notes (Signed)
Patient has been called and DOB has been verified. Patient has been screened and transferred to PCP to start phone visit.   Patient is having urinary issues, patient states that she has had a fever, lower back pain, real bad headaches and sore throat.

## 2018-09-25 ENCOUNTER — Other Ambulatory Visit: Payer: Self-pay | Admitting: Family Medicine

## 2018-09-25 DIAGNOSIS — M5136 Other intervertebral disc degeneration, lumbar region: Secondary | ICD-10-CM

## 2018-09-25 DIAGNOSIS — G8929 Other chronic pain: Secondary | ICD-10-CM

## 2018-09-25 DIAGNOSIS — M545 Low back pain, unspecified: Secondary | ICD-10-CM

## 2018-09-25 DIAGNOSIS — M51369 Other intervertebral disc degeneration, lumbar region without mention of lumbar back pain or lower extremity pain: Secondary | ICD-10-CM

## 2018-09-26 ENCOUNTER — Telehealth: Payer: Self-pay | Admitting: Family Medicine

## 2018-09-26 ENCOUNTER — Other Ambulatory Visit: Payer: Self-pay | Admitting: Family Medicine

## 2018-09-26 DIAGNOSIS — R7303 Prediabetes: Secondary | ICD-10-CM

## 2018-09-26 DIAGNOSIS — M62830 Muscle spasm of back: Secondary | ICD-10-CM

## 2018-09-26 MED FILL — GABAPENTIN 300 MG CAPSULE: 300 | 30 days supply | Qty: 120 | Fill #1

## 2018-09-26 MED FILL — VENLAFAXINE HCL ER 150 MG C: 150 | 30 days supply | Qty: 30 | Fill #0

## 2018-09-26 MED FILL — ?METFORMIN HCL 500MG TABLET: 500 | 30 days supply | Qty: 30 | Fill #0

## 2018-09-26 MED FILL — CYCLOBENZAPRINE 10 MG TAB: 10 | 30 days supply | Qty: 30 | Fill #0

## 2018-09-26 NOTE — Telephone Encounter (Signed)
Patient called stating she would like to know what her COVID test results were. Please follow up.

## 2018-09-26 NOTE — Telephone Encounter (Signed)
Patient called back  Stating her employer wants to know if she can come back to work this week or next week. Please follow up.

## 2018-09-27 MED ORDER — TRAMADOL HCL 50 MG PO TABS: 50.0000 mg | ORAL_TABLET | Freq: Three times a day (TID) | ORAL | 0 refills | Status: DC

## 2018-09-27 MED ORDER — PRAVASTATIN SODIUM 40 MG PO TABS: 40.0000 mg | ORAL_TABLET | Freq: Every day | ORAL | 0 refills | Status: DC

## 2018-09-27 MED FILL — traMADol HCL 50 MG TABS: 50 | 30 days supply | Qty: 90 | Fill #0

## 2018-09-27 MED FILL — ?PRAVASTATIN NA 40 MG TAB: 40 | 30 days supply | Qty: 30 | Fill #0

## 2018-09-27 NOTE — Telephone Encounter (Signed)
Refill request

## 2018-09-27 NOTE — Telephone Encounter (Signed)
RX request received for Pravachol and tramadol and Rx's refilled

## 2018-09-27 NOTE — Telephone Encounter (Signed)
LMOM

## 2018-09-27 NOTE — Telephone Encounter (Signed)
When and where did she have COVID testing done? I did not see a test order/indication that test had been done. Her return to work would depend on her test results as well as if she is still running a fever, having symptoms

## 2018-10-01 ENCOUNTER — Other Ambulatory Visit: Payer: Self-pay | Admitting: Family Medicine

## 2018-10-01 ENCOUNTER — Encounter: Payer: Self-pay | Admitting: Family Medicine

## 2018-10-01 DIAGNOSIS — F411 Generalized anxiety disorder: Secondary | ICD-10-CM

## 2018-10-02 MED ORDER — CLONAZEPAM 0.5 MG PO TABS: ORAL_TABLET | ORAL | 0 refills | Status: DC

## 2018-10-02 MED FILL — ?OMEPRAZOLE 20 MG CAPSULE D: 20 | 30 days supply | Qty: 60 | Fill #10

## 2018-10-02 NOTE — Telephone Encounter (Signed)
Refill request

## 2018-10-02 NOTE — Telephone Encounter (Signed)
Patients call returned.  Patient identified by name and date of birth.  Patient states she had her COVID test done at the [address] location.   Patient given initial paperwork and was expecting result paperwork in the mail.  Patient indicated that the test was negative.  Patient also states Dr.Fulp wanted to see her and get labs as well.  Appointment scheduled.  Patient acknowledged understanding of advice.

## 2018-10-02 NOTE — Telephone Encounter (Signed)
Refill if appropriate please.

## 2018-10-02 NOTE — Telephone Encounter (Signed)
I think she actually spoke with Juluis Mire most recently

## 2018-10-04 ENCOUNTER — Ambulatory Visit: Payer: Self-pay | Attending: Family Medicine | Admitting: Physician Assistant

## 2018-10-04 ENCOUNTER — Telehealth: Payer: Self-pay | Admitting: Family Medicine

## 2018-10-04 ENCOUNTER — Other Ambulatory Visit: Payer: Self-pay

## 2018-10-04 DIAGNOSIS — K0889 Other specified disorders of teeth and supporting structures: Secondary | ICD-10-CM

## 2018-10-04 DIAGNOSIS — K047 Periapical abscess without sinus: Secondary | ICD-10-CM

## 2018-10-04 MED ORDER — KETOROLAC TROMETHAMINE 60 MG/2ML IM SOLN
60.0000 mg | Freq: Once | INTRAMUSCULAR | Status: AC
  Administered 2018-10-04: 60 mg via INTRAMUSCULAR

## 2018-10-04 MED ORDER — AMOXICILLIN-POT CLAVULANATE 875-125 MG PO TABS: 1.0000 | ORAL_TABLET | Freq: Two times a day (BID) | ORAL | 0 refills | Status: DC

## 2018-10-04 MED ORDER — FLUCONAZOLE 150 MG PO TABS: 150.0000 mg | ORAL_TABLET | Freq: Once | ORAL | 0 refills | Status: AC

## 2018-10-04 MED FILL — AMOX-CLAV 875-125 MG TABLET: 875-125 | 10 days supply | Qty: 20 | Fill #0

## 2018-10-04 MED FILL — FLUCONAZOLE 150 MG TABS: 150 | 1 days supply | Qty: 1 | Fill #0

## 2018-10-04 NOTE — Telephone Encounter (Signed)
Patient called stating she would like to speak to you,. Please follow up

## 2018-10-04 NOTE — Telephone Encounter (Signed)
Sharyn Lull, what do you want me to do with this patient?

## 2018-10-04 NOTE — Telephone Encounter (Signed)
Pt was advice what she need to do for applying for [employer]

## 2018-10-04 NOTE — Progress Notes (Signed)
Virtual Visit via Telephone Note  I connected with Isabel Evans on 10/04/18 at  4:10 PM EDT by telephone and verified that I am speaking with the correct person using two identifiers.   I discussed the limitations, risks, security and privacy concerns of performing an evaluation and management service by telephone and the availability of in person appointments. I also discussed with the patient that there may be a patient responsible charge related to this service. The patient expressed understanding and agreed to proceed.  Patient location:  home My Location:  [place] office Persons on the call: myself and the patient  History of Present Illness: Tooth pain L upper jaw and R lower tooth pain where a tooth broke off.  Wants a shot for pain.  Needs referral for dentist.  No fever.  Concerned that she has an abscess bc it has felt like this when she had one before    Observations/Objective:  A&Ox3.     Assessment and Plan: 1. Tooth pain With concern for abscess.  To ED/UC if worsens - Ambulatory referral to Dentistry - ketorolac (TORADOL) injection 60 mg - amoxicillin-clavulanate (AUGMENTIN) 875-125 MG tablet; Take 1 tablet by mouth 2 (two) times daily.  Dispense: 20 tablet; Refill: 0  2. Abscessed tooth - Ambulatory referral to Dentistry - amoxicillin-clavulanate (AUGMENTIN) 875-125 MG tablet; Take 1 tablet by mouth 2 (two) times daily.  Dispense: 20 tablet; Refill: 0    Follow Up Instructions: Keep 6/19 appt with Dr Chapman Fitch.     I discussed the assessment and treatment plan with the patient. The patient was provided an opportunity to ask questions and all were answered. The patient agreed with the plan and demonstrated an understanding of the instructions.   The patient was advised to call back or seek an in-person evaluation if the symptoms worsen or if the condition fails to improve as anticipated.  I provided 8 minutes of non-face-to-face time during this  encounter.   Freeman Caldron, PA-C  Patient ID: Isabel Evans, female   DOB: 1970/08/23, 48 y.o.   MRN: [id number]

## 2018-10-06 ENCOUNTER — Ambulatory Visit: Payer: No Typology Code available for payment source | Admitting: Family Medicine

## 2018-10-06 ENCOUNTER — Encounter: Payer: Self-pay | Admitting: Family Medicine

## 2018-10-12 MED FILL — clonazePAM 0.5 MG TABS: 0.5 | 30 days supply | Qty: 30 | Fill #0

## 2018-10-13 MED FILL — CYCLOBENZAPRINE 10 MG TAB: 10 | 30 days supply | Qty: 30 | Fill #1

## 2018-10-25 ENCOUNTER — Other Ambulatory Visit: Payer: Self-pay | Admitting: Internal Medicine

## 2018-10-25 ENCOUNTER — Other Ambulatory Visit: Payer: Self-pay | Admitting: Family Medicine

## 2018-10-25 DIAGNOSIS — M5136 Other intervertebral disc degeneration, lumbar region: Secondary | ICD-10-CM

## 2018-10-25 DIAGNOSIS — M62830 Muscle spasm of back: Secondary | ICD-10-CM

## 2018-10-25 DIAGNOSIS — M51369 Other intervertebral disc degeneration, lumbar region without mention of lumbar back pain or lower extremity pain: Secondary | ICD-10-CM

## 2018-10-25 DIAGNOSIS — I10 Essential (primary) hypertension: Secondary | ICD-10-CM

## 2018-10-25 DIAGNOSIS — G8929 Other chronic pain: Secondary | ICD-10-CM

## 2018-10-25 DIAGNOSIS — K219 Gastro-esophageal reflux disease without esophagitis: Secondary | ICD-10-CM

## 2018-10-25 DIAGNOSIS — M545 Low back pain, unspecified: Secondary | ICD-10-CM

## 2018-10-25 DIAGNOSIS — M2550 Pain in unspecified joint: Secondary | ICD-10-CM

## 2018-10-25 MED ORDER — AMLODIPINE BESYLATE 10 MG PO TABS: 10.0000 mg | ORAL_TABLET | Freq: Every day | ORAL | 0 refills | Status: DC

## 2018-10-25 MED ORDER — PRAVASTATIN SODIUM 40 MG PO TABS: 40.0000 mg | ORAL_TABLET | Freq: Every day | ORAL | 0 refills | Status: DC

## 2018-10-25 MED ORDER — TRAMADOL HCL 50 MG PO TABS: 50.0000 mg | ORAL_TABLET | Freq: Three times a day (TID) | ORAL | 0 refills | Status: DC

## 2018-10-25 MED ORDER — IBUPROFEN 600 MG PO TABS: 600.0000 mg | ORAL_TABLET | Freq: Three times a day (TID) | ORAL | 0 refills | Status: DC | PRN

## 2018-10-25 MED FILL — ?DULOXETINE HCL 20MG CPE: 20 | 30 days supply | Qty: 30 | Fill #1

## 2018-10-25 MED FILL — DICYCLOMINE 10 MG CAPSULE: 10 | 15 days supply | Qty: 60 | Fill #4

## 2018-10-25 MED FILL — ONDANSETRON ODT 8 MG TABLET: 8 | 20 days supply | Qty: 20 | Fill #2

## 2018-10-25 MED FILL — ?PRAVASTATIN NA 40 MG TAB: 40 | 30 days supply | Qty: 30 | Fill #0

## 2018-10-25 MED FILL — ?METFORMIN HCL 500MG TABLET: 500 | 30 days supply | Qty: 30 | Fill #1

## 2018-10-25 MED FILL — traMADol HCL 50 MG TABS: 50 | 30 days supply | Qty: 90 | Fill #0

## 2018-10-25 MED FILL — ?IBUPROFEN 600 MG TABS: 600 | 10 days supply | Qty: 30 | Fill #0

## 2018-10-25 MED FILL — ?AMLODIPINE BESYLATE 10 MG: 10 | 30 days supply | Qty: 30 | Fill #0

## 2018-10-25 MED FILL — GABAPENTIN 300 MG CAPSULE: 300 | 30 days supply | Qty: 120 | Fill #2

## 2018-10-25 MED FILL — VENLAFAXINE HCL ER 150 MG C: 150 | 30 days supply | Qty: 30 | Fill #1

## 2018-10-25 MED FILL — FLUTICASONE PROP 50 MCG SPR: 50 | 30 days supply | Qty: 16 | Fill #1

## 2018-10-25 NOTE — Telephone Encounter (Signed)
Refill if appropriate .

## 2018-10-26 ENCOUNTER — Other Ambulatory Visit: Payer: Self-pay

## 2018-10-26 ENCOUNTER — Ambulatory Visit: Payer: Self-pay | Attending: Family Medicine | Admitting: Family Medicine

## 2018-10-26 ENCOUNTER — Encounter: Payer: Self-pay | Admitting: Family Medicine

## 2018-10-26 VITALS — BP 128/83 | HR 92 | Temp 98.8°F | Ht 67.0 in | Wt 171.6 lb

## 2018-10-26 DIAGNOSIS — S161XXA Strain of muscle, fascia and tendon at neck level, initial encounter: Secondary | ICD-10-CM

## 2018-10-26 DIAGNOSIS — M62830 Muscle spasm of back: Secondary | ICD-10-CM

## 2018-10-26 DIAGNOSIS — M5030 Other cervical disc degeneration, unspecified cervical region: Secondary | ICD-10-CM

## 2018-10-26 DIAGNOSIS — M62838 Other muscle spasm: Secondary | ICD-10-CM

## 2018-10-26 MED ORDER — KETOROLAC TROMETHAMINE 30 MG/ML IJ SOLN
30.0000 mg | Freq: Once | INTRAMUSCULAR | Status: AC
  Administered 2018-10-26: 30 mg via INTRAMUSCULAR

## 2018-10-26 MED ORDER — CYCLOBENZAPRINE HCL 10 MG PO TABS: ORAL_TABLET | ORAL | 1 refills | Status: DC

## 2018-10-26 MED ORDER — PREDNISONE 20 MG PO TABS: ORAL_TABLET | ORAL | 0 refills | Status: DC

## 2018-10-26 MED FILL — predniSONE 20 MG TABS: 20 | 8 days supply | Qty: 8 | Fill #0

## 2018-10-26 NOTE — Progress Notes (Signed)
Shoulder back of neck and foot pain due to working extra at work

## 2018-10-26 NOTE — Progress Notes (Signed)
Established Patient Office Visit  Subjective:  Patient ID: Isabel Evans, female    DOB: Mar 25, 1971  Age: 48 y.o. MRN: [id number]  CC:  Chief Complaint  Patient presents with  . Shoulder Pain  . Neck Pain  . Foot Pain    HPI Isabel Evans presents due to the complaint of increased pain especially across her upper back/shoulder and neck area due to working long hours.  She works as a Training and development officer at [employer] and states that she often is bending forward while cooking and this is caused her to have increased sharp, sometimes burning pain which goes across her upper back, neck area and bilateral posterior shoulders.  She states that she would like to have an injection of medication to help with the pain.  She also feels as if she needs to have a prescription to take a muscle relaxant more than just at bedtime.  She states that she has taken Flexeril in the daytime in the past and this does not cause her to have any drowsiness.  She currently has a prescription to only take this medication at bedtime.  She continues to have discomfort/pain from the contractures in her hands and feet.  Current pain level is about a 5 or 6 prior to work and not 7-8 and sometimes greater by the time she leaves work.  Past Medical History:  Diagnosis Date  . Anemia 11/24/2017  . Anxiety   . Bone spur    cervical spine  . Chronic kidney disease 04/2017   kidney infections  . Depression   . Deviated nasal septum    states is unable to lie flat, because her nose will become congested and she can stop breathing  . Dysrhythmia    heart flutters occasionally  . Fibromyalgia   . GERD (gastroesophageal reflux disease)   . Hallux abductovalgus with bunions 09/2014   right   . Hammertoe 09/2014   right 4th, 5th  . History of stomach ulcers   . Hypertension    states is under control with med., has been on med. x 8 mos.  . IBS (irritable bowel syndrome)    no current med.  . Osteoarthritis    hands, bilateral  hips  . Peripheral vascular disease (HCC)    occasional swelling in both legs  . Sinus headache     Past Surgical History:  Procedure Laterality Date  . ABDOMINAL HYSTERECTOMY    . BUNIONECTOMY Right 10/04/2014   Procedure:  [place], [place];  Surgeon: Trula Slade, DPM;  Location: [facility];  Service: Podiatry;  Laterality: Right;  . COLONOSCOPY WITH PROPOFOL  02/13/2013  . CYSTOSCOPY N/A 07/07/2017   Procedure: CYSTOSCOPY;  Surgeon: Aletha Halim, MD;  Location: [facility];  Service: Gynecology;  Laterality: N/A;  . DUPUYTREN / PALMAR FASCIOTOMY Right 07/18/2013   exc. of multiple nodules  . 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 HAMMER TOE SURGERY Right 10/04/2014   Procedure: HAMMER TOE REPAIR 4TH  AND 5TH  RIGHT FOOT  fourth toe fixation with k wire;  Surgeon: Trula Slade, DPM;  Location: [facility];  Service: Podiatry;  Laterality: Right;  . 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 TUBAL LIGATION    . VAGINAL HYSTERECTOMY N/A 07/07/2017   Procedure: HYSTERECTOMY VAGINAL;  Surgeon: Aletha Halim, MD;  Location: [facility];  Service: Gynecology;  Laterality: N/A;  Family History  Problem Relation Age of Onset  . Hypertension Mother   . Diabetes Mother   . Heart disease Mother   . Hypertension Father   . Diabetes Father   . Prostate cancer Father   . Heart disease Father   . Alcohol abuse Father   . Hypertension Sister   . Diabetes Sister   . Heart disease Maternal Grandmother        Great GM  . Breast cancer Maternal Grandmother   . Bone cancer Maternal Grandmother   . Breast cancer Maternal Aunt   . Ovarian cancer Maternal Aunt   . Colon cancer Maternal Aunt        great aunt  . Rheum arthritis Sister   . Colon cancer Son     Social History   Tobacco Use  . Smoking status: Former Smoker    Types: Cigarettes    Quit date: 08/24/2012    Years since quitting:  6.1  . Smokeless tobacco: Never Used  Substance Use Topics  . Alcohol use: Yes    Comment: occasionally  . Drug use: No    Outpatient Medications Prior to Visit  Medication Sig Dispense Refill  . amLODipine (NORVASC) 10 MG tablet Take 1 tablet (10 mg total) by mouth daily. 90 tablet 0  . amoxicillin-clavulanate (AUGMENTIN) 875-125 MG tablet Take 1 tablet by mouth 2 (two) times daily. 20 tablet 0  . clonazePAM (KLONOPIN) 0.5 MG tablet 1/2 tablet twice daily as needed for anxiety; 30 day supply 30 tablet 0  . cyclobenzaprine (FLEXERIL) 10 MG tablet TAKE 1 TABLET BY MOUTH AT BEDTIME. TAKE AS NEEDED FOR MUSCLE SPASMS 30 tablet 1  . diclofenac sodium (VOLTAREN) 1 % GEL Apply 4 g topically 4 (four) times daily. (Patient not taking: Reported on 10/04/2018) 1 Tube 2  . dicyclomine (BENTYL) 10 MG capsule TAKE 1 CAPSULE BY MOUTH 4 TIMES DAILY AS NEEDED FOR SPASMS. 60 capsule 6  . diphenhydrAMINE (BENADRYL) 25 MG tablet Take 25 mg by mouth daily as needed for allergies.    . DULoxetine (CYMBALTA) 20 MG capsule Take 1 capsule (20 mg total) by mouth daily. 30 capsule 2  . ferrous gluconate (FERGON) 324 MG tablet Take 1 tablet (324 mg total) by mouth daily with breakfast. (Patient not taking: Reported on 06/14/2018) 30 tablet 0  . fluticasone (FLONASE) 50 MCG/ACT nasal spray PLACE 2 SPRAYS INTO BOTH NOSTRILS DAILY. 16 g 2  . gabapentin (NEURONTIN) 300 MG capsule TAKE 1 CAPSULE BY MOUTH 2 TIMES DAILY AND 2 CAPSULES AT BEDTIME. 120 capsule 6  . hydrOXYzine (ATARAX/VISTARIL) 25 MG tablet Take 1 tablet (25 mg total) by mouth 2 (two) times daily as needed for anxiety. (Patient not taking: Reported on 06/14/2018) 30 tablet 2  . ibuprofen (ADVIL) 600 MG tablet Take 1 tablet (600 mg total) by mouth every 8 (eight) hours as needed for moderate pain. Take after eating 30 tablet 0  . meloxicam (MOBIC) 15 MG tablet Take 1 tablet (15 mg total) by mouth daily. (Patient not taking: Reported on 09/22/2018) 30 tablet 2  .  metFORMIN (GLUCOPHAGE) 500 MG tablet TAKE 1 TABLET BY MOUTH DAILY WITH BREAKFAST. 30 tablet 2  . omeprazole (PRILOSEC) 20 MG capsule TAKE 2 CAPSULES BY MOUTH DAILY. 180 capsule 3  . ondansetron (ZOFRAN-ODT) 8 MG disintegrating tablet Take 1 tablet (8 mg total) by mouth daily as needed for nausea or vomiting. 20 tablet 3  . pravastatin (PRAVACHOL) 40 MG tablet Take 1 tablet (40 mg  total) by mouth daily. Needs to be seen by PCP for more refills. 90 tablet 0  . traMADol (ULTRAM) 50 MG tablet Take 1 tablet (50 mg total) by mouth 3 (three) times daily. As needed for pain. 30 day supply (last filled 06/20/2018) 90 tablet 0  . venlafaxine XR (EFFEXOR-XR) 150 MG 24 hr capsule TAKE 1 CAPSULE BY MOUTH DAILY WITH BREAKFAST. 30 capsule 2   No facility-administered medications prior to visit.     No Known Allergies  ROS Review of Systems  Constitutional: Positive for fatigue. Negative for chills and fever.  HENT: Negative for sore throat and trouble swallowing.   Respiratory: Negative for cough and shortness of breath.   Cardiovascular: Negative for chest pain and palpitations.  Gastrointestinal: Negative for abdominal pain, constipation, diarrhea and nausea.  Endocrine: Negative for polydipsia and polyphagia.  Genitourinary: Negative for dysuria and frequency.  Musculoskeletal: Positive for arthralgias, back pain, gait problem, joint swelling, myalgias, neck pain and neck stiffness.  Neurological: Negative for dizziness and headaches.  Psychiatric/Behavioral: Negative for self-injury and suicidal ideas.      Objective:    Physical Exam  Constitutional: She is oriented to person, place, and time. She appears well-developed and well-nourished.  Neck: Neck supple.  Cardiovascular: Normal rate and regular rhythm.  Pulmonary/Chest: Effort normal and breath sounds normal.  Abdominal: Soft. There is no abdominal tenderness. There is no rebound and no guarding.  Genitourinary:    Genitourinary Comments:  No CVA tenderness   Musculoskeletal:        General: Tenderness and deformity (Palmar contractions in the hands bilaterally) present.     Comments: Patient with tenderness with palpation and muscle spasm along the bilateral upper back/trapezius areas as well as over C6-7  Lymphadenopathy:    She has no cervical adenopathy.  Neurological: She is alert and oriented to person, place, and time.  Skin: Skin is warm and dry.  Psychiatric: She has a normal mood and affect. Her behavior is normal. Judgment and thought content normal.  Nursing note and vitals reviewed.   BP 128/83 (BP Location: Left Arm, Patient Position: Sitting, Cuff Size: Large)   Pulse 92   Temp 98.8 F (37.1 C) (Oral)   Ht 5\' 7"  (1.702 m)   Wt 171 lb 9.6 oz (77.8 kg)   LMP 07/04/2017   SpO2 97%   BMI 26.88 kg/m  Wt Readings from Last 3 Encounters:  10/26/18 171 lb 9.6 oz (77.8 kg)  06/14/18 187 lb 3.2 oz (84.9 kg)  04/20/18 189 lb (85.7 kg)     Lab Results  Component Value Date   TSH 1.290 09/22/2016   Lab Results  Component Value Date   WBC 8.1 03/02/2018   HGB 13.0 03/02/2018   HCT 38.1 03/02/2018   MCV 86.4 03/02/2018   PLT 298 03/02/2018   Lab Results  Component Value Date   NA 141 02/13/2018   K 4.2 02/13/2018   CO2 23 02/13/2018   GLUCOSE 98 02/13/2018   BUN 10 02/13/2018   CREATININE 0.97 02/13/2018   BILITOT <0.2 02/13/2018   ALKPHOS 96 02/13/2018   AST 14 02/13/2018   ALT 12 02/13/2018   PROT 7.0 02/13/2018   ALBUMIN 4.5 02/13/2018   CALCIUM 9.5 02/13/2018   ANIONGAP 11 06/27/2017   Lab Results  Component Value Date   CHOL 294 (H) 09/22/2016   Lab Results  Component Value Date   HDL 63 09/22/2016   Lab Results  Component Value Date   LDLCALC  193 (H) 09/22/2016   Lab Results  Component Value Date   TRIG 190 (H) 09/22/2016   Lab Results  Component Value Date   CHOLHDL 4.7 (H) 09/22/2016   Lab Results  Component Value Date   HGBA1C 5.9 (A) 04/13/2018        Assessment & Plan:  1. DDD (degenerative disc disease), cervical; 2. Acute cervical strain, initial encounter; 3.  Muscle spasms of neck; muscle spasms of back Prescriptions provided for prednisone taper to help with pain and muscle inflammation as well as prescription for Flexeril which patient may take twice daily as needed.  Patient reports that she does not feel drowsy with the use of Flexeril but I encourage patient to take the medication at bedtime and take the medication after she arrives home from her workday as she works approximately 4 hours/day in the morning.  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      She also requested Toradol injection in the office to help with pain and received 30 mg IM x1.  She may take over-the-counter medications as needed for pain and also suggested that patient use warm moist heat to her upper back and neck to help with muscle spasm and discomfort.  Call or return if symptoms are not improving - predniSONE (DELTASONE) 20 MG tablet; 2 pills daily x 2 days, then 1 pill daily x 2 days then 1/2 pill daily x 4 days  Dispense: 8 tablet; Refill: 0 - ketorolac (TORADOL) 30 MG/ML injection 30 mg - cyclobenzaprine (FLEXERIL) 10 MG tablet; One pill twice per day as needed for muscle spasm  Dispense: 60 tablet; Refill: 1   Allergies as of 10/26/2018   No Known Allergies     Medication List       Accurate as of October 26, 2018 11:59 PM. If you have any questions, ask your nurse or doctor.        amLODipine 10 MG tablet Commonly known as: NORVASC Take 1 tablet (10 mg total) by mouth daily.   amoxicillin-clavulanate 875-125 MG tablet Commonly known as: AUGMENTIN Take 1 tablet by mouth 2 (two) times daily.   clonazePAM 0.5 MG  tablet Commonly known as: KLONOPIN 1/2 tablet twice daily as needed for anxiety; 30 day supply   cyclobenzaprine 10 MG tablet Commonly known as: FLEXERIL One pill twice per day as needed for muscle spasm What changed: See the new instructions. Changed by: Antony Blackbird, MD   diclofenac sodium 1 % Gel Commonly known as: Voltaren Apply 4 g topically 4 (four) times daily.   dicyclomine 10 MG capsule Commonly known as: BENTYL TAKE 1 CAPSULE BY MOUTH 4 TIMES DAILY AS NEEDED FOR SPASMS.   diphenhydrAMINE 25 MG tablet Commonly known as: BENADRYL Take 25 mg by mouth daily as needed for allergies.   DULoxetine 20 MG capsule Commonly known as: CYMBALTA Take 1 capsule (20 mg total) by mouth daily.   ferrous gluconate 324 MG tablet Commonly known as: FERGON Take 1 tablet (324 mg total) by mouth daily with breakfast.   fluticasone 50 MCG/ACT nasal spray Commonly known as: FLONASE PLACE 2 SPRAYS INTO BOTH NOSTRILS DAILY.   gabapentin 300 MG capsule Commonly known as: NEURONTIN TAKE 1 CAPSULE BY MOUTH 2 TIMES DAILY AND 2 CAPSULES AT  BEDTIME.   hydrOXYzine 25 MG tablet Commonly known as: ATARAX/VISTARIL Take 1 tablet (25 mg total) by mouth 2 (two) times daily as needed for anxiety.   ibuprofen 600 MG tablet Commonly known as: ADVIL Take 1 tablet (600 mg total) by mouth every 8 (eight) hours as needed for moderate pain. Take after eating   meloxicam 15 MG tablet Commonly known as: MOBIC Take 1 tablet (15 mg total) by mouth daily.   metFORMIN 500 MG tablet Commonly known as: GLUCOPHAGE TAKE 1 TABLET BY MOUTH DAILY WITH BREAKFAST.   omeprazole 20 MG capsule Commonly known as: PRILOSEC TAKE 2 CAPSULES BY MOUTH DAILY.   ondansetron 8 MG disintegrating tablet Commonly known as: ZOFRAN-ODT Take 1 tablet (8 mg total) by mouth daily as needed for nausea or vomiting.   pravastatin 40 MG tablet Commonly known as: PRAVACHOL Take 1 tablet (40 mg total) by mouth daily. Needs to be  seen by PCP for more refills.   predniSONE 20 MG tablet Commonly known as: DELTASONE 2 pills daily x 2 days, then 1 pill daily x 2 days then 1/2 pill daily x 4 days Started by: Antony Blackbird, MD   traMADol 50 MG tablet Commonly known as: ULTRAM Take 1 tablet (50 mg total) by mouth 3 (three) times daily. As needed for pain. 30 day supply (last filled 06/20/2018)   venlafaxine XR 150 MG 24 hr capsule Commonly known as: EFFEXOR-XR TAKE 1 CAPSULE BY MOUTH DAILY WITH BREAKFAST.      An After Visit Summary was printed and given to the patient.   Follow-up: Return if symptoms worsen or fail to improve.   Antony Blackbird, MD

## 2018-11-02 ENCOUNTER — Other Ambulatory Visit: Payer: Self-pay | Admitting: Internal Medicine

## 2018-11-02 DIAGNOSIS — K219 Gastro-esophageal reflux disease without esophagitis: Secondary | ICD-10-CM

## 2018-11-02 MED FILL — CYCLOBENZAPRINE 10 MG TAB: 10 | 30 days supply | Qty: 60 | Fill #0

## 2018-11-03 ENCOUNTER — Other Ambulatory Visit: Payer: Self-pay

## 2018-11-03 MED FILL — ?OMEPRAZOLE 20 MG CAPSULE D: 20 | 30 days supply | Qty: 60 | Fill #0

## 2018-11-03 NOTE — Telephone Encounter (Signed)
Rx sent on 10/25/18 was not received by the pharmacy, rx resent today.

## 2018-11-13 ENCOUNTER — Other Ambulatory Visit: Payer: Self-pay | Admitting: Family Medicine

## 2018-11-13 DIAGNOSIS — F411 Generalized anxiety disorder: Secondary | ICD-10-CM

## 2018-11-13 MED FILL — clonazePAM 0.5 MG TABS: 0.5 | 30 days supply | Qty: 30 | Fill #0

## 2018-11-20 ENCOUNTER — Other Ambulatory Visit: Payer: Self-pay | Admitting: Family Medicine

## 2018-11-20 DIAGNOSIS — F411 Generalized anxiety disorder: Secondary | ICD-10-CM

## 2018-11-29 MED FILL — clonazePAM 0.5 MG TABS: 0.5 | 30 days supply | Qty: 30 | Fill #0

## 2018-11-30 NOTE — Telephone Encounter (Signed)
Called patient and informed her with [facility] number for her to contact them due to them not being able to reach her. Patient agreed to call them to schedule and appt.

## 2018-12-07 ENCOUNTER — Other Ambulatory Visit: Payer: Self-pay | Admitting: Family Medicine

## 2018-12-07 DIAGNOSIS — M545 Low back pain, unspecified: Secondary | ICD-10-CM

## 2018-12-07 DIAGNOSIS — G8929 Other chronic pain: Secondary | ICD-10-CM

## 2018-12-07 DIAGNOSIS — M2550 Pain in unspecified joint: Secondary | ICD-10-CM

## 2018-12-07 DIAGNOSIS — M5136 Other intervertebral disc degeneration, lumbar region: Secondary | ICD-10-CM

## 2018-12-07 DIAGNOSIS — I10 Essential (primary) hypertension: Secondary | ICD-10-CM

## 2018-12-07 DIAGNOSIS — M51369 Other intervertebral disc degeneration, lumbar region without mention of lumbar back pain or lower extremity pain: Secondary | ICD-10-CM

## 2018-12-08 NOTE — Telephone Encounter (Signed)
Spoke with patient and she stated she went on her mychart and accidentally clicked the wrong medication to refill. Per pt she ment to click on Flexeril  And Venlafaxine XR. Staff informed patient she is not suppose to be out of her medications until September. Per pt she will come up to the pharmacy and will get her medications refilled.

## 2018-12-11 ENCOUNTER — Other Ambulatory Visit: Payer: Self-pay | Admitting: Family Medicine

## 2018-12-11 DIAGNOSIS — M2550 Pain in unspecified joint: Secondary | ICD-10-CM

## 2018-12-11 MED FILL — DULoxetine HCL 20 MG CPEP: 20 | 30 days supply | Qty: 30 | Fill #2

## 2018-12-11 MED FILL — VENLAFAXINE HCL ER 150 MG C: 150 | 30 days supply | Qty: 30 | Fill #2

## 2018-12-11 MED FILL — ?OMEPRAZOLE 20MG CAP DR: 20 | 30 days supply | Qty: 60 | Fill #1

## 2018-12-11 MED FILL — ?METFORMIN HCL 500MG TABLET: 500 | 30 days supply | Qty: 30 | Fill #2

## 2018-12-11 MED FILL — ?AMLODIPINE BESYLATE 10 MG: 10 | 30 days supply | Qty: 30 | Fill #1

## 2018-12-11 MED FILL — CYCLOBENZAPRINE 10 MG TAB: 10 | 30 days supply | Qty: 60 | Fill #1

## 2018-12-11 MED FILL — GABAPENTIN 300 MG CAPSULE: 300 | 30 days supply | Qty: 120 | Fill #3

## 2018-12-27 ENCOUNTER — Ambulatory Visit: Payer: Self-pay | Attending: Family Medicine | Admitting: Family Medicine

## 2018-12-27 ENCOUNTER — Other Ambulatory Visit: Payer: Self-pay

## 2018-12-27 ENCOUNTER — Encounter: Payer: Self-pay | Admitting: Family Medicine

## 2018-12-27 DIAGNOSIS — R7303 Prediabetes: Secondary | ICD-10-CM

## 2018-12-27 DIAGNOSIS — M159 Polyosteoarthritis, unspecified: Secondary | ICD-10-CM

## 2018-12-27 DIAGNOSIS — E782 Mixed hyperlipidemia: Secondary | ICD-10-CM

## 2018-12-27 DIAGNOSIS — R002 Palpitations: Secondary | ICD-10-CM

## 2018-12-27 DIAGNOSIS — M51369 Other intervertebral disc degeneration, lumbar region without mention of lumbar back pain or lower extremity pain: Secondary | ICD-10-CM

## 2018-12-27 DIAGNOSIS — M545 Low back pain, unspecified: Secondary | ICD-10-CM

## 2018-12-27 DIAGNOSIS — M720 Palmar fascial fibromatosis [Dupuytren]: Secondary | ICD-10-CM

## 2018-12-27 DIAGNOSIS — M25552 Pain in left hip: Secondary | ICD-10-CM

## 2018-12-27 DIAGNOSIS — Z79899 Other long term (current) drug therapy: Secondary | ICD-10-CM

## 2018-12-27 DIAGNOSIS — G8929 Other chronic pain: Secondary | ICD-10-CM

## 2018-12-27 DIAGNOSIS — F411 Generalized anxiety disorder: Secondary | ICD-10-CM

## 2018-12-27 DIAGNOSIS — M5440 Lumbago with sciatica, unspecified side: Secondary | ICD-10-CM

## 2018-12-27 DIAGNOSIS — I10 Essential (primary) hypertension: Secondary | ICD-10-CM

## 2018-12-27 DIAGNOSIS — M5136 Other intervertebral disc degeneration, lumbar region: Secondary | ICD-10-CM

## 2018-12-27 MED ORDER — TRAMADOL HCL 50 MG PO TABS: 50.0000 mg | ORAL_TABLET | Freq: Three times a day (TID) | ORAL | 3 refills | Status: DC

## 2018-12-27 MED ORDER — VENLAFAXINE HCL ER 150 MG PO CP24: ORAL_CAPSULE | ORAL | 3 refills | Status: DC

## 2018-12-27 MED ORDER — CLONAZEPAM 0.5 MG PO TABS: ORAL_TABLET | ORAL | 3 refills | Status: DC

## 2018-12-27 MED ORDER — ROSUVASTATIN CALCIUM 20 MG PO TABS: 20.0000 mg | ORAL_TABLET | Freq: Every day | ORAL | 3 refills | Status: DC

## 2018-12-27 MED FILL — ?ROSUVASTATIN CALCIUM 20MG: 20 | 30 days supply | Qty: 30 | Fill #0

## 2018-12-27 MED FILL — traMADol HCL 50 MG TABS: 50 | 30 days supply | Qty: 90 | Fill #0

## 2018-12-27 NOTE — Progress Notes (Signed)
Virtual Visit via Telephone Note  I connected with Isabel Evans on 12/27/18 at  8:30 AM EDT by telephone and verified that I am speaking with the correct person using two identifiers.   I discussed the limitations, risks, security and privacy concerns of performing an evaluation and management service by telephone and the availability of in person appointments. I also discussed with the patient that there may be a patient responsible charge related to this service. The patient expressed understanding and agreed to proceed.  Patient Location: Home Provider Location: Office at [facility] Others participating in call:    History of Present Illness:      48 yo female with chronic issues with anxiety, joint pain due to lumbar degenerative disc disease, osteoarthritis of multiple joints as well as Dupuytren's contractures of the hands and feet who is seen in follow-up.  Patient also has new complaint of recurrent sensation of increased heart rate as well as mild shortness of breath, sensation of her heart fluttering and occasional lightheadedness which has been occurring off and on over the past 6 months.  Patient states that her mother was diagnosed with sinus tachycardia and patient wonders if this may be the cause of her symptoms versus panic attacks associated with anxiety.  Patient continues to have multiple joint pain and requests refill of tramadol.  She reports that she has been reading more about fibromyalgia which she states she was diagnosed with in the past and feels that a lot of her achiness may be related to the fibromyalgia.  She reports continued issues with low back pain as well as some left hip pain and states that her pain ranges between a 4-5 and is dull and aching.  She continues to have difficulty with chronic hand pain and feels as if she is getting decreased use of her hands due to the Dupuytren's contractures.  Patient did see orthopedics regarding her hands but was told that she would  need to be referred to a specialist regarding her Dupuytren's contractures.        Patient reports continued issues with chronic anxiety though she feels that the anxiety is fairly stable, no acute worsening on her current Effexor and Klonopin for which she needs refills.  She denies any suicidal thoughts or ideations.  She is not sure of her current issues with sensation of increased heart rate/heart fluttering, shortness of breath and lightheadedness are cardiac related or related to panic attacks from her anxiety.  She denies any chest pain.  No shortness of breath other than with sensation of increased heart rate, no cough.  She denies nausea/vomiting/diarrhea or constipation, no current abdominal pain.  She denies any current urinary frequency or dysuria.  She has had no recent fever or chills.  She denies any sore throat or difficulty swallowing.  She has had no increase in peripheral edema.  She feels her blood pressure is controlled and she has had no headaches or dizziness related to her blood pressure.  She denies any increased thirst, urinary frequency or blurred vision related to her history of prediabetes.  She is agreeable to coming in to have fasting lab work done as she has had no recent follow-up of her hyperlipidemia.  Past Medical History:  Diagnosis Date   Anemia 11/24/2017   Anxiety    Bone spur    cervical spine   Chronic kidney disease 04/2017   kidney infections   Depression    Deviated nasal septum    states is  unable to lie flat, because her nose will become congested and she can stop breathing   Dysrhythmia    heart flutters occasionally   Fibromyalgia    GERD (gastroesophageal reflux disease)    Hallux abductovalgus with bunions 09/2014   right    Hammertoe 09/2014   right 4th, 5th   History of stomach ulcers    Hypertension    states is under control with med., has been on med. x 8 mos.   IBS (irritable bowel syndrome)    no current med.    Osteoarthritis    hands, bilateral hips   Peripheral vascular disease (HCC)    occasional swelling in both legs   Sinus headache     Past Surgical History:  Procedure Laterality Date   ABDOMINAL HYSTERECTOMY     BUNIONECTOMY Right 10/04/2014   Procedure:  Altamese Aulander, Emmaline Life;  Surgeon: Trula Slade, DPM;  Location: [facility];  Service: Podiatry;  Laterality: Right;   COLONOSCOPY WITH PROPOFOL  02/13/2013   CYSTOSCOPY N/A 07/07/2017   Procedure: CYSTOSCOPY;  Surgeon: Aletha Halim, MD;  Location: [facility];  Service: Gynecology;  Laterality: N/A;   DUPUYTREN / PALMAR FASCIOTOMY Right 07/18/2013   exc. 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   small finger   HAMMER TOE SURGERY Right 10/04/2014   Procedure: HAMMER TOE REPAIR 4TH  AND 5TH  RIGHT FOOT  fourth toe fixation with k wire;  Surgeon: Trula Slade, DPM;  Location: [facility];  Service: Podiatry;  Laterality: Right;   LEG SURGERY Left    as a child; near amputation of leg   TUBAL LIGATION     VAGINAL HYSTERECTOMY N/A 07/07/2017   Procedure: HYSTERECTOMY VAGINAL;  Surgeon: Aletha Halim, MD;  Location: [facility];  Service: Gynecology;  Laterality: N/A;    Family History  Problem Relation Age of Onset   Hypertension Mother    Diabetes Mother    Heart disease Mother    Hypertension Father    Diabetes Father    Prostate cancer Father    Heart disease Father    Alcohol abuse Father    Hypertension Sister    Diabetes Sister    Heart disease Maternal Grandmother        Great GM   Breast cancer Maternal Grandmother    Bone cancer Maternal Grandmother    Breast cancer Maternal Aunt    Ovarian cancer Maternal Aunt    Colon cancer Maternal Aunt        great aunt   Rheum arthritis Sister    Colon cancer Son     Social History   Tobacco Use   Smoking status: Former Smoker    Types: Cigarettes    Quit date:  08/24/2012    Years since quitting: 6.3   Smokeless tobacco: Never Used  Substance Use Topics   Alcohol use: Yes    Comment: occasionally   Drug use: No     No Known Allergies     Observations/Objective: No vital signs or physical exam conducted as visit was done via telephone  Assessment and Plan: 1. Palpitations Patient will be referred to cardiology in follow-up of her sensation of palpitations.  She will also have CBC to look for anemia.  She has had anemia in the past but is status post hysterectomy and her prior anemia was thought to be related to her heavy menses.  Patient will have TSH to look for thyroid  disorder as a cause of palpitations and patient will have electrolytes checked as part of comprehensive metabolic panel to look for electrolyte abnormality as a cause of her palpitations. - Ambulatory referral to Cardiology - CBC with Differential; Future - TSH; Future - Comprehensive metabolic panel; Future  2. Generalized anxiety disorder Prescriptions provided for refills of Effexor and clonazepam for continued treatment of anxiety.  She reports that her anxiety is stable at this time.  She is encouraged to continue counseling. - venlafaxine XR (EFFEXOR-XR) 150 MG 24 hr capsule; TAKE 1 CAPSULE BY MOUTH DAILY WITH BREAKFAST.  Dispense: 30 capsule; Refill: 3 - clonazePAM (KLONOPIN) 0.5 MG tablet; TAKE 1/2 TABLET BY MOUTH TWICE DAILY AS NEEDED FOR ANXIETY  Dispense: 30 tablet; Refill: 3  3. Essential hypertension Continue use of amlodipine 10 mg daily along with a low-sodium diet and exercise as tolerated to help control blood pressure. - Lipid panel; Future  4. Dupuytren's contracture of both hands Referral placed for hand specialist, Dr. Fredna Dow as per recommendation of patient's orthopedic doctor, Dr. Erlinda Hong who saw patient in March of this year.  Patient is encouraged to submit all of her paperwork to the financial counselors here to see if she is eligible for continued  reduced medical care/specialty care.  Refill provided for tramadol for chronic pain. - traMADol (ULTRAM) 50 MG tablet; Take 1 tablet (50 mg total) by mouth 3 (three) times daily. As needed for pain. 30 day supply (last filled 06/20/2018)  Dispense: 90 tablet; Refill: 3  5. Chronic left hip pain Prescription provided for refill of tramadol for chronic left hip pain, lumbar degenerative disc disease and chronic low back pain with radiation.  She may take over-the-counter pain medications as needed for less severe pain.  Patient is encouraged to follow-up with orthopedics as needed - traMADol (ULTRAM) 50 MG tablet; Take 1 tablet (50 mg total) by mouth 3 (three) times daily. As needed for pain. 30 day supply (last filled 06/20/2018)  Dispense: 90 tablet; Refill: 3  7. Lumbar degenerative disc disease; 6.  Chronic low back pain without radiation Patient with lumbar degenerative disc disease causing chronic low back pain with radiation.  Continue use of tramadol and may also take over-the-counter Tylenol or Motrin for less severe pain.  8. Pre-diabetes She will come into the office for hemoglobin A1c, comprehensive metabolic panel and fasting lipid panel in follow-up of her prediabetes.  Low carbohydrate diet is encouraged.  Patient has difficulty with exercise secondary to her chronic low back pain and pain in her feet and hands due to contractures.  Patient's most recent hemoglobin A1c was 5.9 in December 2019. - Lipid panel; Future - Comprehensive metabolic panel; Future - Hemoglobin A1C; Future  9. Encounter for long-term (current) use of medications Patient will have fasting lab visit for comprehensive metabolic panel in follow-up of long-term use of medications for hypertension, hyperlipidemia and chronic pain - Comprehensive metabolic panel; Future  10. Osteoarthritis of multiple joints, unspecified osteoarthritis type Refill provided of tramadol and patient is encouraged to stay active.  She may  take over-the-counter Tylenol arthritis or nonsteroidal anti-inflammatories as needed for less severe pain  11. Mixed hyperlipidemia Last lipid panel per chart was done on 09/24/2016 with total cholesterol of 294, triglycerides 190 and LDL of 193.  She has been on pravastatin but will see if she can be placed on Crestor instead but will change back to pravastatin if she is unable to afford the Crestor.  Patient is to schedule appointment  for fasting lab visit for lipid panel and CMP in follow-up of long-term use of statin medication.  Low-fat diet encouraged - Lipid panel; Future - Comprehensive metabolic panel; Future  Follow Up Instructions:Return in about 4 months (around 04/28/2019) for chronic issues; 4 months and as needed.    I discussed the assessment and treatment plan with the patient. The patient was provided an opportunity to ask questions and all were answered. The patient agreed with the plan and demonstrated an understanding of the instructions.   The patient was advised to call back or seek an in-person evaluation if the symptoms worsen or if the condition fails to improve as anticipated.  I provided 15 minutes of non-face-to-face time during this encounter.   Antony Blackbird, MD

## 2018-12-27 NOTE — Progress Notes (Signed)
Patient verified DOB Patient has not taken medication today. Patient has not eaten today. Patient complains of generalized pain at a 4. Klonopin, Pravastatin, Tramadol, Effexor needs refills.

## 2018-12-28 ENCOUNTER — Ambulatory Visit: Payer: Self-pay | Attending: Family Medicine

## 2018-12-28 ENCOUNTER — Other Ambulatory Visit: Payer: Self-pay

## 2018-12-28 ENCOUNTER — Ambulatory Visit: Payer: No Typology Code available for payment source | Admitting: Pharmacist

## 2018-12-28 DIAGNOSIS — Z79899 Other long term (current) drug therapy: Secondary | ICD-10-CM

## 2018-12-28 DIAGNOSIS — R7303 Prediabetes: Secondary | ICD-10-CM

## 2018-12-28 DIAGNOSIS — I10 Essential (primary) hypertension: Secondary | ICD-10-CM

## 2018-12-28 DIAGNOSIS — R002 Palpitations: Secondary | ICD-10-CM

## 2018-12-28 DIAGNOSIS — E782 Mixed hyperlipidemia: Secondary | ICD-10-CM

## 2018-12-29 LAB — LIPID PANEL
Chol/HDL Ratio: 3 ratio (ref 0.0–4.4)
Cholesterol, Total: 207 mg/dL — ABNORMAL HIGH (ref 100–199)
HDL: 69 mg/dL
LDL Chol Calc (NIH): 121 mg/dL — ABNORMAL HIGH (ref 0–99)
Triglycerides: 94 mg/dL (ref 0–149)
VLDL Cholesterol Cal: 17 mg/dL (ref 5–40)

## 2018-12-29 LAB — COMPREHENSIVE METABOLIC PANEL WITH GFR
ALT: 10 IU/L (ref 0–32)
AST: 16 IU/L (ref 0–40)
Albumin/Globulin Ratio: 1.9 (ref 1.2–2.2)
Albumin: 4.6 g/dL (ref 3.8–4.8)
Alkaline Phosphatase: 76 IU/L (ref 39–117)
BUN/Creatinine Ratio: 10 (ref 9–23)
BUN: 7 mg/dL (ref 6–24)
Bilirubin Total: 0.3 mg/dL (ref 0.0–1.2)
CO2: 25 mmol/L (ref 20–29)
Calcium: 9.9 mg/dL (ref 8.7–10.2)
Chloride: 99 mmol/L (ref 96–106)
Creatinine, Ser: 0.71 mg/dL (ref 0.57–1.00)
GFR calc Af Amer: 117 mL/min/1.73
GFR calc non Af Amer: 102 mL/min/1.73
Globulin, Total: 2.4 g/dL (ref 1.5–4.5)
Glucose: 131 mg/dL — ABNORMAL HIGH (ref 65–99)
Potassium: 3.8 mmol/L (ref 3.5–5.2)
Sodium: 139 mmol/L (ref 134–144)
Total Protein: 7 g/dL (ref 6.0–8.5)

## 2018-12-29 LAB — CBC WITH DIFFERENTIAL/PLATELET
Basophils Absolute: 0.1 x10E3/uL (ref 0.0–0.2)
Basos: 1 %
EOS (ABSOLUTE): 0.2 x10E3/uL (ref 0.0–0.4)
Eos: 2 %
Hematocrit: 42.1 % (ref 34.0–46.6)
Hemoglobin: 13.9 g/dL (ref 11.1–15.9)
Immature Grans (Abs): 0 x10E3/uL (ref 0.0–0.1)
Immature Granulocytes: 0 %
Lymphocytes Absolute: 2 x10E3/uL (ref 0.7–3.1)
Lymphs: 28 %
MCH: 29.1 pg (ref 26.6–33.0)
MCHC: 33 g/dL (ref 31.5–35.7)
MCV: 88 fL (ref 79–97)
Monocytes Absolute: 0.6 x10E3/uL (ref 0.1–0.9)
Monocytes: 8 %
Neutrophils Absolute: 4.4 x10E3/uL (ref 1.4–7.0)
Neutrophils: 61 %
Platelets: 312 x10E3/uL (ref 150–450)
RBC: 4.77 x10E6/uL (ref 3.77–5.28)
RDW: 15 % (ref 11.7–15.4)
WBC: 7.3 x10E3/uL (ref 3.4–10.8)

## 2018-12-29 LAB — HEMOGLOBIN A1C
Est. average glucose Bld gHb Est-mCnc: 108 mg/dL
Hgb A1c MFr Bld: 5.4 % (ref 4.8–5.6)

## 2018-12-29 LAB — TSH: TSH: 0.446 u[IU]/mL — ABNORMAL LOW (ref 0.450–4.500)

## 2019-01-01 MED FILL — clonazePAM 0.5 MG TABS: 0.5 | 30 days supply | Qty: 30 | Fill #0

## 2019-01-02 ENCOUNTER — Other Ambulatory Visit: Payer: Self-pay | Admitting: Family Medicine

## 2019-01-02 DIAGNOSIS — R7989 Other specified abnormal findings of blood chemistry: Secondary | ICD-10-CM

## 2019-01-02 NOTE — Progress Notes (Signed)
Patient ID: Isabel Evans, female   DOB: 1970-09-10, 48 y.o.   MRN: [id number]   Patient with abnormal TSH on recent blood work.  Patient will be contacted by medical assistant and will be asked to repeat TSH along with T4 in the next 1 to 2 weeks

## 2019-01-08 ENCOUNTER — Other Ambulatory Visit: Payer: Self-pay | Admitting: Primary Care

## 2019-01-08 ENCOUNTER — Other Ambulatory Visit: Payer: Self-pay | Admitting: Family Medicine

## 2019-01-08 DIAGNOSIS — R7303 Prediabetes: Secondary | ICD-10-CM

## 2019-01-08 DIAGNOSIS — M62838 Other muscle spasm: Secondary | ICD-10-CM

## 2019-01-08 DIAGNOSIS — K582 Mixed irritable bowel syndrome: Secondary | ICD-10-CM

## 2019-01-08 DIAGNOSIS — M797 Fibromyalgia: Secondary | ICD-10-CM

## 2019-01-08 DIAGNOSIS — F32A Depression, unspecified: Secondary | ICD-10-CM

## 2019-01-08 DIAGNOSIS — F329 Major depressive disorder, single episode, unspecified: Secondary | ICD-10-CM

## 2019-01-08 DIAGNOSIS — M62830 Muscle spasm of back: Secondary | ICD-10-CM

## 2019-01-08 MED FILL — VENLAFAXINE HCL ER 150 MG C: 150 | 30 days supply | Qty: 30 | Fill #0

## 2019-01-08 MED FILL — GABAPENTIN 300 MG CAPSULE: 300 | 30 days supply | Qty: 120 | Fill #4

## 2019-01-08 MED FILL — ONDANSETRON ODT 8 MG TABLET: 8 | 20 days supply | Qty: 20 | Fill #3

## 2019-01-08 MED FILL — ROSUVASTATIN CALCIUM 20 MG: 20 | 30 days supply | Qty: 30 | Fill #0

## 2019-01-08 MED FILL — AMLODIPINE BESYLATE 10 MG T: 10 | 30 days supply | Qty: 30 | Fill #2

## 2019-01-08 MED FILL — OMEPRAZOLE 20 MG CAP: 20 | 30 days supply | Qty: 60 | Fill #2

## 2019-01-08 MED FILL — traMADol HCL 50 MG TABS: 50 | 30 days supply | Qty: 90 | Fill #0

## 2019-01-08 MED FILL — FLUTICASONE PROP 50 MCG SPR: 50 | 30 days supply | Qty: 16 | Fill #2

## 2019-01-09 NOTE — Telephone Encounter (Signed)
Sent to patients PCP. Patient had appointment on 9/20, please refill if appropriate. Nat Christen, CMA

## 2019-01-09 NOTE — Telephone Encounter (Signed)
Sent to patients pcp. Kerin Perna, NP

## 2019-01-09 NOTE — Telephone Encounter (Signed)
Sent to patients PCP. Kerin Perna, NP

## 2019-01-10 MED FILL — DULoxetine HCL 20 MG CPEP: 20 | 30 days supply | Qty: 30 | Fill #0

## 2019-01-10 MED FILL — CYCLOBENZAPRINE 10 MG TAB: 10 | 30 days supply | Qty: 60 | Fill #0

## 2019-01-10 MED FILL — metFORMIN HCL 500 MG TABS: 500 | 30 days supply | Qty: 30 | Fill #0

## 2019-01-10 MED FILL — DICYCLOMINE 10 MG CAPSULE: 10 | 15 days supply | Qty: 60 | Fill #0

## 2019-01-11 ENCOUNTER — Other Ambulatory Visit: Payer: Self-pay

## 2019-01-11 ENCOUNTER — Ambulatory Visit: Payer: No Typology Code available for payment source | Attending: Family Medicine

## 2019-01-11 ENCOUNTER — Ambulatory Visit: Payer: No Typology Code available for payment source | Admitting: *Deleted

## 2019-01-11 VITALS — BP 97/65 | HR 75

## 2019-01-11 DIAGNOSIS — R7989 Other specified abnormal findings of blood chemistry: Secondary | ICD-10-CM

## 2019-01-11 DIAGNOSIS — Z0130 Encounter for examination of blood pressure without abnormal findings: Secondary | ICD-10-CM

## 2019-01-11 NOTE — Progress Notes (Signed)
Patient presented for blood work and BP check.

## 2019-01-12 LAB — T4 AND TSH
T4, Total: 5.5 ug/dL (ref 4.5–12.0)
TSH: 1.41 u[IU]/mL (ref 0.450–4.500)

## 2019-01-25 NOTE — Progress Notes (Signed)
Cardiology Office Note:   Date:  01/26/2019  NAME:  Isabel Evans    MRN: [id number] DOB:  [birth year]   PCP:  Antony Blackbird, MD  Cardiologist:  Evalina Field, MD   Referring MD: Antony Blackbird, MD   Chief Complaint  Patient presents with  . Palpitations   History of Present Illness:   Isabel Evans is a 48 y.o. female with a hx of hypertension, hyperlipidemia, anxiety, arthritis who is being seen today for the evaluation of palpitations at the request of Fulp, Cammie, MD.  She reports 4-5 episodes of palpitations/flutter in her chest for the past 1 month.  She reports she is never had these symptoms before.  No recent medication changes.  She states she has a symptoms every day, and they last about 1 minute in duration.  Associated symptoms include shortness of breath.  She denies any chest pain or dizziness with them.  She is able to continue working without limitations.  She does not exercise routinely, but has no physical limitations.  She does have mobility limitations from back surgery chronic pain, and fibromyalgia.  She is a former smoker but quit about 6 years ago.  Cardiovascular risk factors include hypertension that is well controlled, diabetes, and hyperlipidemia.  She does report significant cardiac disease in her mother and father.  Her mother had atrial fibrillation, and appears to have had an ablation.  Past Medical History: Past Medical History:  Diagnosis Date  . Anemia 11/24/2017  . Anxiety   . Bone spur    cervical spine  . Chronic kidney disease 04/2017   kidney infections  . Depression   . Deviated nasal septum    states is unable to lie flat, because her nose will become congested and she can stop breathing  . Diabetes mellitus without complication ([place])   . Dysrhythmia    heart flutters occasionally  . Fibromyalgia   . GERD (gastroesophageal reflux disease)   . Hallux abductovalgus with bunions 09/2014   right   . Hammertoe 09/2014   right 4th, 5th   . History of stomach ulcers   . Hyperlipidemia   . Hypertension    states is under control with med., has been on med. x 8 mos.  . IBS (irritable bowel syndrome)    no current med.  . Osteoarthritis    hands, bilateral hips  . Peripheral vascular disease (HCC)    occasional swelling in both legs  . Sinus headache     Past Surgical History: Past Surgical History:  Procedure Laterality Date  . ABDOMINAL HYSTERECTOMY    . BUNIONECTOMY Right 10/04/2014   Procedure:  Altamese Tresckow, Emmaline Life;  Surgeon: Trula Slade, DPM;  Location: [facility];  Service: Podiatry;  Laterality: Right;  . COLONOSCOPY WITH PROPOFOL  02/13/2013  . CYSTOSCOPY N/A 07/07/2017   Procedure: CYSTOSCOPY;  Surgeon: Aletha Halim, MD;  Location: [facility];  Service: Gynecology;  Laterality: N/A;  . DUPUYTREN / PALMAR FASCIOTOMY Right 07/18/2013   exc. of multiple nodules  . 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 HAMMER TOE SURGERY Right 10/04/2014   Procedure: HAMMER TOE REPAIR 4TH  AND 5TH  RIGHT FOOT  fourth toe fixation with k wire;  Surgeon: Trula Slade, DPM;  Location: [facility];  Service: Podiatry;  Laterality: Right;  . 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 TUBAL LIGATION    .  VAGINAL HYSTERECTOMY N/A 07/07/2017   Procedure: HYSTERECTOMY VAGINAL;  Surgeon: Aletha Halim, MD;  Location: [facility];  Service: Gynecology;  Laterality: N/A;    Current Medications: Current Meds  Medication Sig  . amLODipine (NORVASC) 10 MG tablet Take 1 tablet (10 mg total) by mouth daily.  . clonazePAM (KLONOPIN) 0.5 MG tablet TAKE 1/2 TABLET BY MOUTH TWICE DAILY AS NEEDED FOR ANXIETY  . cyclobenzaprine (FLEXERIL) 10 MG tablet TAKE ONE TABLET BY MOUTH TWICE DAILY AS NEEDED FOR MUSCLE SPASM  . dicyclomine (BENTYL) 10 MG capsule TAKE 1 CAPSULE BY MOUTH 4 TIMES DAILY AS NEEDED FOR SPASMS.  Marland Kitchen diphenhydrAMINE (BENADRYL) 25 MG tablet  Take 25 mg by mouth daily as needed for allergies.  . DULoxetine (CYMBALTA) 20 MG capsule TAKE 1 CAPSULE BY MOUTH DAILY.  . fluticasone (FLONASE) 50 MCG/ACT nasal spray PLACE 2 SPRAYS INTO BOTH NOSTRILS DAILY.  Marland Kitchen gabapentin (NEURONTIN) 300 MG capsule TAKE 1 CAPSULE BY MOUTH 2 TIMES DAILY AND 2 CAPSULES AT BEDTIME.  Marland Kitchen ibuprofen (ADVIL) 600 MG tablet Take 1 tablet (600 mg total) by mouth every 8 (eight) hours as needed for moderate pain. Take after eating  . metFORMIN (GLUCOPHAGE) 500 MG tablet TAKE 1 TABLET BY MOUTH DAILY WITH BREAKFAST.  Marland Kitchen omeprazole (PRILOSEC) 20 MG capsule TAKE 2 CAPSULES BY MOUTH DAILY.  Marland Kitchen ondansetron (ZOFRAN-ODT) 8 MG disintegrating tablet Take 1 tablet (8 mg total) by mouth daily as needed for nausea or vomiting.  . rosuvastatin (CRESTOR) 20 MG tablet Take 1 tablet (20 mg total) by mouth daily. To lower cholesterol  . traMADol (ULTRAM) 50 MG tablet Take 1 tablet (50 mg total) by mouth 3 (three) times daily. As needed for pain. 30 day supply (last filled 06/20/2018)  . venlafaxine XR (EFFEXOR-XR) 150 MG 24 hr capsule TAKE 1 CAPSULE BY MOUTH DAILY WITH BREAKFAST.     Allergies:    Patient has no known allergies.   Social History: Social History   Socioeconomic History  . Marital status: Single    Spouse name: Not on file  . Number of children: 3  . Years of education: Not on file  . Highest education level: 7th grade  Occupational History  . Occupation: Training and development officer  Social Needs  . Financial resource strain: Not on file  . Food insecurity    Worry: Not on file    Inability: Not on file  . Transportation needs    Medical: Yes    Non-medical: No  Tobacco Use  . Smoking status: Former Smoker    Packs/day: 1.00    Years: 20.00    Pack years: 20.00    Types: Cigarettes    Quit date: 08/24/2012    Years since quitting: 6.4  . Smokeless tobacco: Never Used  Substance and Sexual Activity  . Alcohol use: Yes    Comment: occasionally  . Drug use: No  . Sexual activity:  Yes    Birth control/protection: Surgical  Lifestyle  . Physical activity    Days per week: Not on file    Minutes per session: Not on file  . Stress: Not on file  Relationships  . Social Herbalist on phone: Not on file    Gets together: Not on file    Attends religious service: Not on file    Active member of club or organization: Not on file    Attends meetings of clubs or organizations: Not on file    Relationship status: Not on file  Other  Topics Concern  . Not on file  Social History Narrative  . Not on file     Family History: The patient's family history includes Alcohol abuse in her father; Bone cancer in her maternal grandmother; Breast cancer in her maternal aunt and maternal grandmother; Colon cancer in her maternal aunt and son; Diabetes in her father, mother, and sister; Heart disease in her father, maternal grandmother, and mother; Hypertension in her father, mother, and sister; Ovarian cancer in her maternal aunt; Prostate cancer in her father; Rheum arthritis in her sister.  ROS:   All other ROS reviewed and negative. Pertinent positives noted in the HPI.     EKGs/Labs/Other Studies Reviewed:   The following studies were personally reviewed by me today:  EKG:  EKG is ordered today.  The ekg ordered today demonstrates normal sinus rhythm, heart rate 77, no acute ST-T changes, no prior infarction, normal intervals, single PVC noted, and was personally reviewed by me.   Recent Labs: 12/28/2018: ALT 10; BUN 7; Creatinine, Ser 0.71; Hemoglobin 13.9; Platelets 312; Potassium 3.8; Sodium 139 01/11/2019: TSH 1.410   Recent Lipid Panel    Component Value Date/Time   CHOL 207 (H) 12/28/2018 0944   TRIG 94 12/28/2018 0944   HDL 69 12/28/2018 0944   CHOLHDL 3.0 12/28/2018 0944   LDLCALC 121 (H) 12/28/2018 0944    Physical Exam:   VS:  BP 118/80   Pulse 77   Ht 5\' 11"  (1.803 m)   Wt 173 lb (78.5 kg)   LMP 07/04/2017   BMI 24.13 kg/m    Wt Readings  from Last 3 Encounters:  01/26/19 173 lb (78.5 kg)  10/26/18 171 lb 9.6 oz (77.8 kg)  06/14/18 187 lb 3.2 oz (84.9 kg)    General: Well nourished, well developed, in no acute distress Heart: Atraumatic, normal size  Eyes: PEERLA, EOMI  Neck: Supple, no JVD Endocrine: No thryomegaly Cardiac: Normal S1, S2; RRR; no murmurs, rubs, or gallops Lungs: Clear to auscultation bilaterally, no wheezing, rhonchi or rales  Abd: Soft, nontender, no hepatomegaly  Ext: No edema, pulses 2+ Musculoskeletal: No deformities, BUE and BLE strength normal and equal Skin: Warm and dry, no rashes   Neuro: Alert and oriented to person, place, time, and situation, CNII-XII grossly intact, no focal deficits  Psych: Normal mood and affect   ASSESSMENT:   NAME@ is a 48 y.o. female who presents for the following: 1. Palpitations   2. PVC (premature ventricular contraction)   3. Mixed hyperlipidemia   4. Essential hypertension     PLAN:   1. Palpitations 2. PVC (premature ventricular contraction) -She presents with 1 month of daily palpitations.  A PVC was captured on her EKG.  I wonder if she is having frequent PVCs, and they are symptomatic.  We will proceed with a 48-hour Holter monitor to exclude any other arrhythmias.  We will hold on any medications at this time. -EKG without acute ST-T changes, no prior infarction.  We will obtain echocardiogram given her symptoms.  3. Mixed hyperlipidemia -Most recent LDL cholesterol okay we will continue Crestor  4. Essential hypertension -Controlled on amlodipine.  Blood pressure at goal in office.  Disposition: Return in about 3 months (around 04/28/2019).  Medication Adjustments/Labs and Tests Ordered: Current medicines are reviewed at length with the patient today.  Concerns regarding medicines are outlined above.  Orders Placed This Encounter  Procedures  . HOLTER MONITOR - 48 HOUR  . EKG 12-Lead  . ECHOCARDIOGRAM COMPLETE  No orders of the defined  types were placed in this encounter.   Patient Instructions  Medication Instructions:  Your physician recommends that you continue on your current medications as directed.  If you need a refill on your cardiac medications before your next appointment, please call your pharmacy.   Lab work: None needed  Testing/Procedures: Your physician has requested that you have an echocardiogram. Echocardiography is a painless test that uses sound waves to create images of your heart. It provides your doctor with information about the size and shape of your heart and how well your heart's chambers and valves are working. This procedure takes approximately one hour. There are no restrictions for this procedure. -- done at [address]  Your physician has recommended that you wear a holter monitor for 48 hours. Holter monitors are medical devices that record the heart's electrical activity. Doctors most often use these monitors to diagnose arrhythmias. Arrhythmias are problems with the speed or rhythm of the heartbeat. The monitor is a small, portable device. You can wear one while you do your normal daily activities. This is usually used to diagnose what is causing palpitations/syncope (passing out).  Follow-Up: -- 3 months with Dr. Audie Box       Signed, Addison Naegeli. Audie Box, [facility]  [address] [place], [place] [address] [phone]  01/26/2019 9:11 AM

## 2019-01-26 ENCOUNTER — Ambulatory Visit (INDEPENDENT_AMBULATORY_CARE_PROVIDER_SITE_OTHER): Payer: Self-pay | Admitting: Cardiovascular Disease

## 2019-01-26 ENCOUNTER — Encounter: Payer: Self-pay | Admitting: Cardiovascular Disease

## 2019-01-26 ENCOUNTER — Other Ambulatory Visit: Payer: Self-pay

## 2019-01-26 VITALS — BP 118/80 | HR 77 | Ht 71.0 in | Wt 173.0 lb

## 2019-01-26 DIAGNOSIS — I493 Ventricular premature depolarization: Secondary | ICD-10-CM

## 2019-01-26 DIAGNOSIS — I10 Essential (primary) hypertension: Secondary | ICD-10-CM

## 2019-01-26 DIAGNOSIS — R002 Palpitations: Secondary | ICD-10-CM

## 2019-01-26 DIAGNOSIS — E782 Mixed hyperlipidemia: Secondary | ICD-10-CM

## 2019-01-26 NOTE — Patient Instructions (Signed)
Medication Instructions:  Your physician recommends that you continue on your current medications as directed.  If you need a refill on your cardiac medications before your next appointment, please call your pharmacy.   Lab work: None needed  Testing/Procedures: Your physician has requested that you have an echocardiogram. Echocardiography is a painless test that uses sound waves to create images of your heart. It provides your doctor with information about the size and shape of your heart and how well your heart's chambers and valves are working. This procedure takes approximately one hour. There are no restrictions for this procedure. -- done at [address]  Your physician has recommended that you wear a holter monitor for 48 hours. Holter monitors are medical devices that record the heart's electrical activity. Doctors most often use these monitors to diagnose arrhythmias. Arrhythmias are problems with the speed or rhythm of the heartbeat. The monitor is a small, portable device. You can wear one while you do your normal daily activities. This is usually used to diagnose what is causing palpitations/syncope (passing out).  Follow-Up: -- 3 months with Dr. Audie Box

## 2019-01-29 ENCOUNTER — Encounter: Payer: Self-pay | Admitting: Family Medicine

## 2019-01-31 ENCOUNTER — Telehealth: Payer: Self-pay

## 2019-01-31 ENCOUNTER — Telehealth: Payer: Self-pay | Admitting: *Deleted

## 2019-01-31 MED FILL — clonazePAM 0.5 MG TABS: 0.5 | 30 days supply | Qty: 30 | Fill #1

## 2019-01-31 NOTE — Telephone Encounter (Signed)
Spoke to Isabel Evans, gave her monitor instructions. She is self-pay so I advised her to call [employer] at [phone] to discuss payment options. She agreed to do this and will call us back if she can not afford it. Verified address. 3 day ZIO monitor ordered to be mailed to Isabel Evans's home address.

## 2019-01-31 NOTE — Telephone Encounter (Signed)
Spoke with patient regarding her mychart message she sent to the provider. Informed patient with what provider stated and she verbalized understanding.

## 2019-02-02 ENCOUNTER — Ambulatory Visit (HOSPITAL_COMMUNITY): Payer: Self-pay | Attending: Cardiology

## 2019-02-02 ENCOUNTER — Other Ambulatory Visit: Payer: Self-pay

## 2019-02-02 DIAGNOSIS — I493 Ventricular premature depolarization: Secondary | ICD-10-CM | POA: Insufficient documentation

## 2019-02-02 DIAGNOSIS — R002 Palpitations: Secondary | ICD-10-CM | POA: Insufficient documentation

## 2019-02-06 ENCOUNTER — Ambulatory Visit (INDEPENDENT_AMBULATORY_CARE_PROVIDER_SITE_OTHER): Payer: Self-pay

## 2019-02-06 DIAGNOSIS — I493 Ventricular premature depolarization: Secondary | ICD-10-CM

## 2019-02-06 DIAGNOSIS — R002 Palpitations: Secondary | ICD-10-CM

## 2019-02-12 MED FILL — metFORMIN HCL 500 MG TABS: 500 | 30 days supply | Qty: 30 | Fill #1

## 2019-02-12 MED FILL — AMLODIPINE BESYLATE 10 MG T: 10 | 30 days supply | Qty: 30 | Fill #0

## 2019-02-12 MED FILL — traMADol HCL 50 MG TABS: 50 | 30 days supply | Qty: 90 | Fill #1

## 2019-02-12 MED FILL — VENLAFAXINE HCL ER 150 MG C: 150 | 30 days supply | Qty: 30 | Fill #1

## 2019-02-12 MED FILL — ROSUVASTATIN CALCIUM 20 MG: 20 | 30 days supply | Qty: 30 | Fill #1

## 2019-02-12 MED FILL — OMEPRAZOLE 20 MG CAP: 20 | 30 days supply | Qty: 60 | Fill #3

## 2019-02-12 MED FILL — GABAPENTIN 300 MG CAPSULE: 300 | 30 days supply | Qty: 120 | Fill #5

## 2019-02-12 MED FILL — DULoxetine HCL 20 MG CPEP: 20 | 30 days supply | Qty: 30 | Fill #1

## 2019-02-13 MED FILL — CYCLOBENZAPRINE 10 MG TAB: 10 | 30 days supply | Qty: 60 | Fill #1

## 2019-02-18 ENCOUNTER — Other Ambulatory Visit: Payer: Self-pay | Admitting: Family Medicine

## 2019-02-18 DIAGNOSIS — M2550 Pain in unspecified joint: Secondary | ICD-10-CM

## 2019-02-19 MED ORDER — IBUPROFEN 600 MG PO TABS: 600.0000 mg | ORAL_TABLET | Freq: Three times a day (TID) | ORAL | 0 refills | Status: DC | PRN

## 2019-02-19 MED FILL — IBUPROFEN 600 MG TABLET: 600 | 10 days supply | Qty: 30 | Fill #0

## 2019-02-19 NOTE — Telephone Encounter (Signed)
Isabel Evans may you fill if appropriate

## 2019-02-22 ENCOUNTER — Ambulatory Visit: Payer: Self-pay | Attending: Family Medicine

## 2019-02-22 ENCOUNTER — Other Ambulatory Visit: Payer: Self-pay

## 2019-03-07 MED FILL — clonazePAM 0.5 MG TABS: 0.5 | 30 days supply | Qty: 30 | Fill #2

## 2019-03-08 ENCOUNTER — Telehealth: Payer: Self-pay | Admitting: Family Medicine

## 2019-03-08 NOTE — Telephone Encounter (Signed)
Pt was sent a letter from financial dept. Inform them, that the application they submitted was incomplete, since they were missing some documentation at the time of the appointment, Pt need to reschedule and resubmit all new papers and application for [employer] and [employer], P.S. old documents has been sent back by mail to the Pt and Pt. need to make a new appt. 

## 2019-03-18 ENCOUNTER — Other Ambulatory Visit: Payer: Self-pay | Admitting: Family Medicine

## 2019-03-18 DIAGNOSIS — I10 Essential (primary) hypertension: Secondary | ICD-10-CM

## 2019-03-18 MED ORDER — AMLODIPINE BESYLATE 10 MG PO TABS: 10.0000 mg | ORAL_TABLET | Freq: Every day | ORAL | 0 refills | Status: DC

## 2019-03-19 ENCOUNTER — Other Ambulatory Visit: Payer: Self-pay | Admitting: Family Medicine

## 2019-03-19 DIAGNOSIS — F411 Generalized anxiety disorder: Secondary | ICD-10-CM

## 2019-03-19 DIAGNOSIS — R110 Nausea: Secondary | ICD-10-CM

## 2019-03-19 DIAGNOSIS — M62830 Muscle spasm of back: Secondary | ICD-10-CM

## 2019-03-19 DIAGNOSIS — M2550 Pain in unspecified joint: Secondary | ICD-10-CM

## 2019-03-19 DIAGNOSIS — M62838 Other muscle spasm: Secondary | ICD-10-CM

## 2019-03-19 MED FILL — GABAPENTIN 300 MG CAPSULE: 300 | 30 days supply | Qty: 120 | Fill #6

## 2019-03-19 MED FILL — ROSUVASTATIN CALCIUM 20 MG: 20 | 30 days supply | Qty: 30 | Fill #2

## 2019-03-19 MED FILL — IBUPROFEN 600 MG TABLET: 600 | 10 days supply | Qty: 30 | Fill #0

## 2019-03-19 MED FILL — VENLAFAXINE HCL ER 150 MG C: 150 | 30 days supply | Qty: 30 | Fill #2

## 2019-03-19 MED FILL — traMADol HCL 50 MG TABS: 50 | 30 days supply | Qty: 90 | Fill #2

## 2019-03-19 MED FILL — DULoxetine HCL 20 MG CPEP: 20 | 30 days supply | Qty: 30 | Fill #2

## 2019-03-19 MED FILL — DICYCLOMINE 10 MG CAPSULE: 10 | 15 days supply | Qty: 60 | Fill #1

## 2019-03-19 MED FILL — metFORMIN HCL 500 MG TABS: 500 | 30 days supply | Qty: 30 | Fill #2

## 2019-03-19 MED FILL — OMEPRAZOLE 20 MG CAP: 20 | 30 days supply | Qty: 60 | Fill #4

## 2019-03-19 MED FILL — CYCLOBENZAPRINE 10 MG TAB: 10 | 30 days supply | Qty: 60 | Fill #0

## 2019-03-19 MED FILL — AMLODIPINE BESYLATE 10 MG T: 10 | 30 days supply | Qty: 30 | Fill #1

## 2019-03-19 NOTE — Telephone Encounter (Signed)
Please fill if appropriate.  

## 2019-03-20 MED FILL — ONDANSETRON ODT 8 MG TABLET: 8 | 7 days supply | Qty: 20 | Fill #0

## 2019-03-20 MED FILL — FLUTICASONE PROP 50 MCG SPR: 50 | 20 days supply | Qty: 16 | Fill #0

## 2019-03-30 ENCOUNTER — Other Ambulatory Visit: Payer: Self-pay

## 2019-03-30 DIAGNOSIS — Z20822 Contact with and (suspected) exposure to covid-19: Secondary | ICD-10-CM

## 2019-04-02 ENCOUNTER — Telehealth: Payer: Self-pay

## 2019-04-02 LAB — NOVEL CORONAVIRUS, NAA: SARS-CoV-2, NAA: DETECTED — AB

## 2019-04-02 NOTE — Telephone Encounter (Signed)
Patient called and was informed that her COVID-19 test was positive and she can pass the germ to others. She states she can not smell or taste and she gets SOB when active.  Symptom tier and criteria for ending isolation were read to patient. Good preventative practices were discussed.  She was told other Co workers should wait at least 5-7 days after exposure before testing and should quarantine for at least 14 days post exposure to her.  She was given the new test site prceedure for scheduling test. She verbalized understanding of all information. [place] [facility] will be notified.

## 2019-04-05 ENCOUNTER — Ambulatory Visit: Payer: HRSA Program | Attending: Family Medicine | Admitting: Physician Assistant

## 2019-04-05 ENCOUNTER — Other Ambulatory Visit: Payer: Self-pay

## 2019-04-05 DIAGNOSIS — U071 COVID-19: Secondary | ICD-10-CM | POA: Diagnosis not present

## 2019-04-05 MED FILL — clonazePAM 0.5 MG TABS: 0.5 | 30 days supply | Qty: 30 | Fill #3

## 2019-04-05 NOTE — Progress Notes (Signed)
Patient has been called and DOB has been verified. Patient has been screened and transferred to PCP to start phone visit.  Patient is having pain in neck and back.

## 2019-04-05 NOTE — Progress Notes (Signed)
Virtual Visit via Telephone Note  I connected with Isabel Evans on 04/05/19 at  9:10 AM EST by telephone and verified that I am speaking with the correct person using two identifiers.   I discussed the limitations, risks, security and privacy concerns of performing an evaluation and management service by telephone and the availability of in person appointments. I also discussed with the patient that there may be a patient responsible charge related to this service. The patient expressed understanding and agreed to proceed.  Patient location:  home My Location:  [place] office Persons on the call: me and the patient  History of Present Illness:  6 days ago tested + for Covid-19. Current symptoms c/o body aches.  Poor sleep due sweats.  HA.  Minimal cough.  Some post nasal drip.  Fever up until 2 days ago was ~101.  No runny nose.  No respiratory distress.  Some diarrhea day one but not since.  No N/V.  Started having symptoms on 03/28/2019. Having abnormal taste and smell.    Blood sugars running ok.      Observations/Objective:  NAD.  A&Ox3   Assessment and Plan: 1. COVID-19 virus detected Fluids, rest.  Alternate advil and tylenol. Seems to be improving. Answered all questions and concerns   Follow Up Instructions: 2 months with PCP   I discussed the assessment and treatment plan with the patient. The patient was provided an opportunity to ask questions and all were answered. The patient agreed with the plan and demonstrated an understanding of the instructions.   The patient was advised to call back or seek an in-person evaluation if the symptoms worsen or if the condition fails to improve as anticipated.  I provided 15 minutes of non-face-to-face time during this encounter.   Freeman Caldron, PA-C  Patient ID: Isabel Evans, female   DOB: 1970-10-14, 48 y.o.   MRN: [id number]

## 2019-04-16 ENCOUNTER — Encounter: Payer: Self-pay | Admitting: Family Medicine

## 2019-04-16 ENCOUNTER — Other Ambulatory Visit: Payer: Self-pay | Admitting: Family Medicine

## 2019-04-16 DIAGNOSIS — F32A Depression, unspecified: Secondary | ICD-10-CM

## 2019-04-16 DIAGNOSIS — M797 Fibromyalgia: Secondary | ICD-10-CM

## 2019-04-16 DIAGNOSIS — R7303 Prediabetes: Secondary | ICD-10-CM

## 2019-04-16 DIAGNOSIS — F329 Major depressive disorder, single episode, unspecified: Secondary | ICD-10-CM

## 2019-04-16 MED FILL — AMLODIPINE BESYLATE 10 MG T: 10 | 30 days supply | Qty: 30 | Fill #2

## 2019-04-16 MED FILL — DULoxetine HCL 20 MG CPEP: 20 | 30 days supply | Qty: 30 | Fill #0

## 2019-04-16 MED FILL — DICYCLOMINE 10 MG CAPSULE: 10 | 15 days supply | Qty: 60 | Fill #2

## 2019-04-16 MED FILL — CYCLOBENZAPRINE 10 MG TAB: 10 | 30 days supply | Qty: 60 | Fill #1

## 2019-04-16 MED FILL — ROSUVASTATIN CALCIUM 20 MG: 20 | 30 days supply | Qty: 30 | Fill #3

## 2019-04-16 MED FILL — VENLAFAXINE HCL ER 150 MG C: 150 | 30 days supply | Qty: 30 | Fill #3

## 2019-04-16 MED FILL — OMEPRAZOLE 20 MG CAP: 20 | 30 days supply | Qty: 60 | Fill #5

## 2019-04-17 MED FILL — traMADol HCL 50 MG TABS: 50 | 30 days supply | Qty: 90 | Fill #3

## 2019-04-17 MED FILL — ONDANSETRON ODT 8 MG TABLET: 8 | 7 days supply | Qty: 20 | Fill #0

## 2019-04-23 ENCOUNTER — Other Ambulatory Visit: Payer: Self-pay | Admitting: Pharmacist

## 2019-04-23 DIAGNOSIS — M797 Fibromyalgia: Secondary | ICD-10-CM

## 2019-04-23 DIAGNOSIS — R7303 Prediabetes: Secondary | ICD-10-CM

## 2019-04-23 MED ORDER — METFORMIN HCL 500 MG PO TABS: 500.0000 mg | ORAL_TABLET | Freq: Every day | ORAL | 2 refills | Status: DC

## 2019-04-24 MED FILL — ?METFORMIN HCL 500MG TABLET: 500 | 30 days supply | Qty: 30 | Fill #0

## 2019-04-26 ENCOUNTER — Ambulatory Visit: Payer: Self-pay | Admitting: Cardiovascular Disease

## 2019-05-07 ENCOUNTER — Other Ambulatory Visit: Payer: Self-pay | Admitting: Family Medicine

## 2019-05-07 DIAGNOSIS — M797 Fibromyalgia: Secondary | ICD-10-CM

## 2019-05-07 MED FILL — ?METFORMIN HCL 500MG TABLET: 500 | 30 days supply | Qty: 30 | Fill #0

## 2019-05-07 MED FILL — GABAPENTIN 300 MG CAPSULE: 300 | 30 days supply | Qty: 120 | Fill #0

## 2019-05-08 ENCOUNTER — Other Ambulatory Visit: Payer: Self-pay | Admitting: Family Medicine

## 2019-05-08 DIAGNOSIS — F411 Generalized anxiety disorder: Secondary | ICD-10-CM

## 2019-05-11 MED FILL — clonazePAM 0.5 MG TABS: 0.5 | 30 days supply | Qty: 30 | Fill #0

## 2019-05-28 ENCOUNTER — Other Ambulatory Visit: Payer: Self-pay | Admitting: Family Medicine

## 2019-05-28 DIAGNOSIS — M720 Palmar fascial fibromatosis [Dupuytren]: Secondary | ICD-10-CM

## 2019-05-28 DIAGNOSIS — F32A Depression, unspecified: Secondary | ICD-10-CM

## 2019-05-28 DIAGNOSIS — M62830 Muscle spasm of back: Secondary | ICD-10-CM

## 2019-05-28 DIAGNOSIS — F411 Generalized anxiety disorder: Secondary | ICD-10-CM

## 2019-05-28 DIAGNOSIS — M545 Low back pain, unspecified: Secondary | ICD-10-CM

## 2019-05-28 DIAGNOSIS — M25552 Pain in left hip: Secondary | ICD-10-CM

## 2019-05-28 DIAGNOSIS — M797 Fibromyalgia: Secondary | ICD-10-CM

## 2019-05-28 DIAGNOSIS — K582 Mixed irritable bowel syndrome: Secondary | ICD-10-CM

## 2019-05-28 DIAGNOSIS — F329 Major depressive disorder, single episode, unspecified: Secondary | ICD-10-CM

## 2019-05-28 DIAGNOSIS — G8929 Other chronic pain: Secondary | ICD-10-CM

## 2019-05-28 DIAGNOSIS — M62838 Other muscle spasm: Secondary | ICD-10-CM

## 2019-05-28 MED FILL — CYCLOBENZAPRINE 10 MG TAB: 10 | 30 days supply | Qty: 60 | Fill #0

## 2019-05-28 MED FILL — traMADol HCL 50 MG TABS: 50 | 30 days supply | Qty: 90 | Fill #0

## 2019-05-28 MED FILL — DULoxetine HCL 20 MG CPEP: 20 | 30 days supply | Qty: 30 | Fill #0

## 2019-05-28 MED FILL — VENLAFAXINE HCL ER 150 MG C: 150 | 30 days supply | Qty: 30 | Fill #0

## 2019-05-28 MED FILL — ROSUVASTATIN CALCIUM 20 MG: 20 | 30 days supply | Qty: 30 | Fill #4

## 2019-05-28 MED FILL — DICYCLOMINE 10 MG CAPSULE: 10 | 15 days supply | Qty: 60 | Fill #0

## 2019-05-28 MED FILL — AMLODIPINE BESYLATE 10 MG T: 10 | 30 days supply | Qty: 30 | Fill #0

## 2019-05-28 MED FILL — OMEPRAZOLE 20 MG CAP: 20 | 30 days supply | Qty: 60 | Fill #6

## 2019-05-30 ENCOUNTER — Other Ambulatory Visit: Payer: Self-pay | Admitting: Family Medicine

## 2019-05-30 DIAGNOSIS — M2550 Pain in unspecified joint: Secondary | ICD-10-CM

## 2019-05-30 MED FILL — FLUTICASONE PROP 50 MCG SPR: 50 | 20 days supply | Qty: 16 | Fill #0

## 2019-05-30 MED FILL — ONDANSETRON ODT 8 MG TABLET: 8 | 7 days supply | Qty: 20 | Fill #1

## 2019-05-31 MED FILL — ?IBUPROFEN 600 MG TABLETS: 600 | 10 days supply | Qty: 30 | Fill #0

## 2019-06-14 MED FILL — ?METFORMIN HCL 500MG TABLET: 500 | 30 days supply | Qty: 30 | Fill #1

## 2019-06-28 ENCOUNTER — Other Ambulatory Visit: Payer: Self-pay | Admitting: Family Medicine

## 2019-06-28 DIAGNOSIS — F411 Generalized anxiety disorder: Secondary | ICD-10-CM

## 2019-06-28 DIAGNOSIS — M2550 Pain in unspecified joint: Secondary | ICD-10-CM

## 2019-06-28 MED FILL — CYCLOBENZAPRINE 10 MG TAB: 10 | 30 days supply | Qty: 60 | Fill #1

## 2019-06-28 MED FILL — ?ROSUVASTATIN CALCIUM 20MG: 20 | 30 days supply | Qty: 30 | Fill #5

## 2019-06-28 MED FILL — GABAPENTIN 300 MG CAPSULE: 300 | 30 days supply | Qty: 120 | Fill #1

## 2019-06-28 MED FILL — DICYCLOMINE 10 MG CAPSULE: 10 | 15 days supply | Qty: 60 | Fill #1

## 2019-06-28 MED FILL — traMADol HCL 50 MG TABS: 50 | 30 days supply | Qty: 90 | Fill #1

## 2019-06-28 MED FILL — DULoxetine HCL 20 MG CPEP: 20 | 30 days supply | Qty: 30 | Fill #1

## 2019-06-28 MED FILL — AMLODIPINE BESYLATE 10 MG T: 10 | 30 days supply | Qty: 30 | Fill #1

## 2019-06-28 MED FILL — VENLAFAXINE HCL ER 150 MG C: 150 | 30 days supply | Qty: 30 | Fill #1

## 2019-06-28 MED FILL — ?IBUPROFEN 600 MG TABLETS: 600 | 10 days supply | Qty: 30 | Fill #0

## 2019-06-28 MED FILL — ?OMEPRAZOLE 20MG CAP DR: 20 | 30 days supply | Qty: 60 | Fill #7

## 2019-07-02 ENCOUNTER — Other Ambulatory Visit: Payer: Self-pay | Admitting: Family Medicine

## 2019-07-02 ENCOUNTER — Ambulatory Visit: Payer: Self-pay | Admitting: Family

## 2019-07-02 DIAGNOSIS — F411 Generalized anxiety disorder: Secondary | ICD-10-CM

## 2019-07-06 ENCOUNTER — Other Ambulatory Visit: Payer: Self-pay

## 2019-07-06 ENCOUNTER — Encounter: Payer: Self-pay | Admitting: Family

## 2019-07-06 ENCOUNTER — Ambulatory Visit: Payer: Self-pay | Attending: Family | Admitting: Family

## 2019-07-06 VITALS — BP 113/76 | HR 89 | Temp 97.9°F | Resp 16 | Wt 172.2 lb

## 2019-07-06 DIAGNOSIS — M720 Palmar fascial fibromatosis [Dupuytren]: Secondary | ICD-10-CM

## 2019-07-06 DIAGNOSIS — M797 Fibromyalgia: Secondary | ICD-10-CM

## 2019-07-06 DIAGNOSIS — E782 Mixed hyperlipidemia: Secondary | ICD-10-CM

## 2019-07-06 DIAGNOSIS — I10 Essential (primary) hypertension: Secondary | ICD-10-CM

## 2019-07-06 DIAGNOSIS — R7303 Prediabetes: Secondary | ICD-10-CM

## 2019-07-06 DIAGNOSIS — K219 Gastro-esophageal reflux disease without esophagitis: Secondary | ICD-10-CM

## 2019-07-06 DIAGNOSIS — R110 Nausea: Secondary | ICD-10-CM

## 2019-07-06 DIAGNOSIS — F411 Generalized anxiety disorder: Secondary | ICD-10-CM

## 2019-07-06 MED ORDER — AMLODIPINE BESYLATE 10 MG PO TABS: 10.0000 mg | ORAL_TABLET | Freq: Every day | ORAL | 0 refills | Status: DC

## 2019-07-06 MED ORDER — OMEPRAZOLE 20 MG PO CPDR: 40.0000 mg | DELAYED_RELEASE_CAPSULE | Freq: Every day | ORAL | 2 refills | Status: DC

## 2019-07-06 MED ORDER — CLONAZEPAM 0.5 MG PO TABS: ORAL_TABLET | ORAL | 0 refills | Status: DC

## 2019-07-06 MED ORDER — ONDANSETRON 8 MG PO TBDP: 8.0000 mg | ORAL_TABLET | Freq: Every day | ORAL | 2 refills | Status: DC | PRN

## 2019-07-06 MED ORDER — GABAPENTIN 300 MG PO CAPS: ORAL_CAPSULE | ORAL | 1 refills | Status: DC

## 2019-07-06 MED ORDER — ROSUVASTATIN CALCIUM 20 MG PO TABS: 20.0000 mg | ORAL_TABLET | Freq: Every day | ORAL | 2 refills | Status: DC

## 2019-07-06 MED ORDER — METFORMIN HCL 500 MG PO TABS: 500.0000 mg | ORAL_TABLET | Freq: Every day | ORAL | 2 refills | Status: DC

## 2019-07-06 MED FILL — clonazePAM 0.5 MG TABS: 0.5 | 30 days supply | Qty: 30 | Fill #0

## 2019-07-06 NOTE — Patient Instructions (Signed)
Follow-up in 1 month. Labs today.  Hypertension, Adult Hypertension is another name for high blood pressure. High blood pressure forces your heart to work harder to pump blood. This can cause problems over time. There are two numbers in a blood pressure reading. There is a top number (systolic) over a bottom number (diastolic). It is best to have a blood pressure that is below 120/80. Healthy choices can help lower your blood pressure, or you may need medicine to help lower it. What are the causes? The cause of this condition is not known. Some conditions may be related to high blood pressure. What increases the risk?  Smoking.  Having type 2 diabetes mellitus, high cholesterol, or both.  Not getting enough exercise or physical activity.  Being overweight.  Having too much fat, sugar, calories, or salt (sodium) in your diet.  Drinking too much alcohol.  Having long-term (chronic) kidney disease.  Having a family history of high blood pressure.  Age. Risk increases with age.  Race. You may be at higher risk if you are African American.  Gender. Men are at higher risk than women before age 88. After age 34, women are at higher risk than men.  Having obstructive sleep apnea.  Stress. What are the signs or symptoms?  High blood pressure may not cause symptoms. Very high blood pressure (hypertensive crisis) may cause: ? Headache. ? Feelings of worry or nervousness (anxiety). ? Shortness of breath. ? Nosebleed. ? A feeling of being sick to your stomach (nausea). ? Throwing up (vomiting). ? Changes in how you see. ? Very bad chest pain. ? Seizures. How is this treated?  This condition is treated by making healthy lifestyle changes, such as: ? Eating healthy foods. ? Exercising more. ? Drinking less alcohol.  Your health care provider may prescribe medicine if lifestyle changes are not enough to get your blood pressure under control, and if: ? Your top number is above  130. ? Your bottom number is above 80.  Your personal target blood pressure may vary. Follow these instructions at home: Eating and drinking   If told, follow the DASH eating plan. To follow this plan: ? Fill one half of your plate at each meal with fruits and vegetables. ? Fill one fourth of your plate at each meal with whole grains. Whole grains include whole-wheat pasta, brown rice, and whole-grain bread. ? Eat or drink low-fat dairy products, such as skim milk or low-fat yogurt. ? Fill one fourth of your plate at each meal with low-fat (lean) proteins. Low-fat proteins include fish, chicken without skin, eggs, beans, and tofu. ? Avoid fatty meat, cured and processed meat, or chicken with skin. ? Avoid pre-made or processed food.  Eat less than 1,500 mg of salt each day.  Do not drink alcohol if: ? Your doctor tells you not to drink. ? You are pregnant, may be pregnant, or are planning to become pregnant.  If you drink alcohol: ? Limit how much you use to:  0-1 drink a day for women.  0-2 drinks a day for men. ? Be aware of how much alcohol is in your drink. In the [place] one drink equals one 12 oz bottle of beer (355 mL), one 5 oz glass of wine (148 mL), or one 1 oz glass of hard liquor (44 mL). Lifestyle   Work with your doctor to stay at a healthy weight or to lose weight. Ask your doctor what the best weight is for you.  Get  at least 30 minutes of exercise most days of the week. This may include walking, swimming, or biking.  Get at least 30 minutes of exercise that strengthens your muscles (resistance exercise) at least 3 days a week. This may include lifting weights or doing Pilates.  Do not use any products that contain nicotine or tobacco, such as cigarettes, e-cigarettes, and chewing tobacco. If you need help quitting, ask your doctor.  Check your blood pressure at home as told by your doctor.  Keep all follow-up visits as told by your doctor. This is  important. Medicines  Take over-the-counter and prescription medicines only as told by your doctor. Follow directions carefully.  Do not skip doses of blood pressure medicine. The medicine does not work as well if you skip doses. Skipping doses also puts you at risk for problems.  Ask your doctor about side effects or reactions to medicines that you should watch for. Contact a doctor if you:  Think you are having a reaction to the medicine you are taking.  Have headaches that keep coming back (recurring).  Feel dizzy.  Have swelling in your ankles.  Have trouble with your vision. Get help right away if you:  Get a very bad headache.  Start to feel mixed up (confused).  Feel weak or numb.  Feel faint.  Have very bad pain in your: ? Chest. ? Belly (abdomen).  Throw up more than once.  Have trouble breathing. Summary  Hypertension is another name for high blood pressure.  High blood pressure forces your heart to work harder to pump blood.  For most people, a normal blood pressure is less than 120/80.  Making healthy choices can help lower blood pressure. If your blood pressure does not get lower with healthy choices, you may need to take medicine. This information is not intended to replace advice given to you by your health care provider. Make sure you discuss any questions you have with your health care provider. Document Revised: 12/14/2017 Document Reviewed: 12/14/2017 Elsevier Patient Education  2020 [employer].

## 2019-07-06 NOTE — Progress Notes (Signed)
Patient ID: Isabel Evans, female    DOB: [birth year]  MRN: [id number]  CC: Medication Refill  Subjective: [name] is a 49 y.o. female with history of essential hypertension, irritable bowel syndrome, GERD without esophagitis, osteoarthritis, Dupuytren's of both hands, Heberden's nodes, urinary tract infection, insomnia, fibromyalgia, back pain, midline low back pain without sciatica, generalized anxiety disorder, anemia, and prediabetes who presents for medication refill.  1. HYPERTENSION FOLLOW-UP:  Currently taking: see medication list Med Adherence: '[x]'$  Yes    '[]'$  No Medication side effects: '[]'$  Yes    '[x]'$  No  Adherence with salt restriction: '[x]'$  Yes    '[]'$  No Exercise: Yes '[x]'$  No '[]'$  Home Monitoring?: '[x]'$  Yes    '[]'$  No Monitoring Frequency: '[x]'$  Yes    '[]'$  No Home BP results range: '[x]'$  Yes, 120-130/70-80 SOB? '[]'$  Yes    '[x]'$  No Chest Pain?: '[]'$  Yes    '[x]'$  No Leg swelling?: '[x]'$  Yes, when standing for long periods of time Headaches?: '[x]'$  Yes, especially with stress, Tramadol and Ibuprofen and help Dizziness? '[]'$  Yes    '[x]'$  No Comments: Last visit September 2020 with Dr. Chapman Fitch. During that encounter Amlodipine 10 mg daily continued with low-sodium diet and exercise as tolerated to help control blood pressure and lipid panel scheduled for future.   2. PRE-DIABETES TYPE 2 FOLLOW-UP:  Last A1C: 5.22 December 2018 Med Adherence:  '[x]'$  Yes    '[]'$  No Medication side effects:  '[]'$  Yes    '[x]'$  No Home Monitoring?  '[]'$  Yes    '[x]'$  No Home glucose results range: Denies Diet Adherence: '[x]'$  Yes    '[]'$  No Exercise: '[]'$  Yes    '[]'$  No Hypoglycemic episodes?: '[]'$  Yes    '[]'$  No Numbness of the feet? '[]'$  Yes    '[]'$  No Retinopathy hx? '[]'$  Yes    '[]'$  No Last eye exam: Need eye exam have floaters Comments: Last visit September 2020 with Dr. Chapman Fitch. During that encounter lipid panel, comprehensive metabolic panel, and hemoglobin A1C scheduled in the future and low-carb diet encouraged.    3. HYPERLIPIDEMIA  FOLLOW-UP:  Last Lipid Panel results:  HDL  Date Value Ref Range Status  12/28/2018 69 >39 mg/dL Final   Triglycerides  Date Value Ref Range Status  12/28/2018 94 0 - 149 mg/dL Final    Med Adherence: '[x]'$  Yes    '[]'$  No Medication side effects: '[]'$  Yes    '[x]'$  No Muscle aches:  '[]'$  Yes    '[x]'$  No Diet Adherence: '[x]'$  Yes    '[]'$  No Comments: Last visit September 2020 with Dr. Chapman Fitch. During that encounter Pravastatin was discontinued and replaced with Crestor with lipid panel and comprehensive metabolic panel scheduled for future.  4. ANXIETY FOLLOW-UP:  Patient complains of anxiety disorder.  She denies current suicidal and homicidal ideation. Previous treatment includes Effexor and Klonopin and none.  She complains of the following side effects from the treatment: none.  Reports causes of anxiety include her occupation and family life related to her mom, sister, and nieces.  Reports that she is currently not in counseling anymore because she feels like she is not as anxious or depressed as she was in the past. Last visit September 2020 with Dr. Chapman Fitch. During that time Effexor and Klonopin were refilled, anxiety was stable at that time, and recommended to continue counseling.  5. FIBROMYALGIA FOLLOW-UP:  History of fibromyalgia which sometimes causes nausea. Fibromyalgia stable since last visit. Last medication refill January 2021.  6. DUPUYTREN'S CONTRACTURE  BOTH HANDS FOLLOW-UP: Reports hands have gotten worse since last visit. Had surgery in the past. Reports seeing a specialist doctor who says that additional surgeries are not recommended as it may cause hand paralysis. Last visit September 2020 with Dr. Chapman Fitch. During that encounter referral was placed for hand specialist and refill provided for Tramadol.  7. GERD FOLLOW-UP:  Paitent complains of heartburn. This has been associated with early satiety, heartburn, hoarseness, nausea and regurgitation of undigested food.  She denies no other  symptoms.She denies dysphagia. She denies melena, hematochezia, hematemesis, and coffee ground emesis. Medical therapy in the past has included H2 antagonists.   8. NAUSEA FOLLOW-UP: Cause by GERD. Zofran helps with this.   Patient Active Problem List   Diagnosis Date Noted  . Protrusion of cervical intervertebral disc 03/22/2018  . Anemia 11/24/2017  . Pre-diabetes 11/24/2017  . Blurry vision, bilateral 05/18/2017  . Callus of foot 05/18/2017  . Pain in joint of left hip 02/16/2017  . Bruises easily 11/10/2016  . Gastroesophageal reflux disease without esophagitis 09/22/2016  . Fatigue associated with anemia 09/22/2016  . Flexion contractures 09/22/2016  . Generalized anxiety disorder 01/01/2016  . Dental abscess 01/01/2016  . Healthcare maintenance 05/08/2015  . Adjustment disorder with mixed anxiety and depressed mood 05/08/2015  . Stress at home 10/24/2014  . Irritable bowel syndrome 10/24/2014  . Anxiety state 10/24/2014  . Preop testing 09/19/2014  . Pap smear for cervical cancer screening 09/19/2014  . Midline low back pain without sciatica 08/12/2014  . Heberden nodes 08/12/2014  . Contracture of joint, hand 08/12/2014  . Essential hypertension 02/11/2014  . Allergy 02/11/2014  . Screening for breast cancer 02/11/2014  . Dupuytren's contracture of both hands 11/15/2013  . Hallux valgus of right foot 11/15/2013  . Back pain 05/21/2013  . UTI (urinary tract infection) 05/21/2013  . Multiple skin nodules 11/27/2012  . Fibromyalgia 09/08/2012  . Osteoarthritis 09/08/2012  . Neck muscle spasm 07/14/2012  . Plantar wart 07/14/2012  . Insomnia 07/14/2012     Current Outpatient Medications on File Prior to Visit  Medication Sig Dispense Refill  . amLODipine (NORVASC) 10 MG tablet Take 1 tablet (10 mg total) by mouth daily. 90 tablet 0  . clonazePAM (KLONOPIN) 0.5 MG tablet TAKE 1/2 TABLET BY MOUTH TWICE DAILY AS NEEDED FOR ANXIETY; Appointment needed 30 tablet 0  .  cyclobenzaprine (FLEXERIL) 10 MG tablet TAKE ONE TABLET BY MOUTH TWICE DAILY AS NEEDED FOR MUSCLE SPASM 60 tablet 1  . dicyclomine (BENTYL) 10 MG capsule TAKE 1 CAPSULE BY MOUTH 4 TIMES DAILY AS NEEDED FOR SPASMS. 60 capsule 2  . diphenhydrAMINE (BENADRYL) 25 MG tablet Take 25 mg by mouth daily as needed for allergies.    . DULoxetine (CYMBALTA) 20 MG capsule TAKE 1 CAPSULE BY MOUTH DAILY. 30 capsule 2  . fluticasone (FLONASE) 50 MCG/ACT nasal spray PLACE 2 SPRAYS INTO BOTH NOSTRILS DAILY. 16 g 11  . gabapentin (NEURONTIN) 300 MG capsule TAKE 1 CAPSULE BY MOUTH 2 TIMES DAILY AND 2 CAPSULES AT BEDTIME. 120 capsule 1  . ibuprofen (ADVIL) 600 MG tablet TAKE 1 TABLET (600 MG TOTAL) BY MOUTH EVERY 8 (EIGHT) HOURS AS NEEDED FOR MODERATE PAIN. TAKE AFTER EATING 30 tablet 0  . metFORMIN (GLUCOPHAGE) 500 MG tablet Take 1 tablet (500 mg total) by mouth daily with breakfast. 30 tablet 2  . omeprazole (PRILOSEC) 20 MG capsule TAKE 2 CAPSULES BY MOUTH DAILY. 180 capsule 3  . ondansetron (ZOFRAN-ODT) 8 MG disintegrating tablet TAKE  1 TABLET (8 MG TOTAL) BY MOUTH DAILY AS NEEDED FOR NAUSEA OR VOMITING. 20 tablet 2  . rosuvastatin (CRESTOR) 20 MG tablet Take 1 tablet (20 mg total) by mouth daily. To lower cholesterol 90 tablet 3  . traMADol (ULTRAM) 50 MG tablet TAKE 1 TABLET (50 MG TOTAL) BY MOUTH 3 (THREE) TIMES DAILY. AS NEEDED FOR PAIN. 30 DAY SUPPLY 90 tablet 1  . venlafaxine XR (EFFEXOR-XR) 150 MG 24 hr capsule TAKE 1 CAPSULE BY MOUTH DAILY WITH BREAKFAST. 30 capsule 3   No current facility-administered medications on file prior to visit.    No Known Allergies  Social History   Socioeconomic History  . Marital status: Single    Spouse name: Not on file  . Number of children: 3  . Years of education: Not on file  . Highest education level: 7th grade  Occupational History  . Occupation: cook  Tobacco Use  . Smoking status: Former Smoker    Packs/day: 1.00    Years: 20.00    Pack years: 20.00     Types: Cigarettes    Quit date: 08/24/2012    Years since quitting: 6.8  . Smokeless tobacco: Never Used  Substance and Sexual Activity  . Alcohol use: Yes    Comment: occasionally  . Drug use: No  . Sexual activity: Yes    Birth control/protection: Surgical  Other Topics Concern  . Not on file  Social History Narrative  . Not on file   Social Determinants of Health   Financial Resource Strain:   . Difficulty of Paying Living Expenses:   Food Insecurity:   . Worried About Charity fundraiser in the Last Year:   . Arboriculturist in the Last Year:   Transportation Needs:   . Film/video editor (Medical):   Marland Kitchen Lack of Transportation (Non-Medical):   Physical Activity:   . Days of Exercise per Week:   . Minutes of Exercise per Session:   Stress:   . Feeling of Stress :   Social Connections:   . Frequency of Communication with Friends and Family:   . Frequency of Social Gatherings with Friends and Family:   . Attends Religious Services:   . Active Member of Clubs or Organizations:   . Attends Archivist Meetings:   Marland Kitchen Marital Status:   Intimate Partner Violence:   . Fear of Current or Ex-Partner:   . Emotionally Abused:   Marland Kitchen Physically Abused:   . Sexually Abused:     Family History  Problem Relation Age of Onset  . Hypertension Mother   . Diabetes Mother   . Heart disease Mother   . Hypertension Father   . Diabetes Father   . Prostate cancer Father   . Heart disease Father   . Alcohol abuse Father   . Hypertension Sister   . Diabetes Sister   . Heart disease Maternal Grandmother        Great GM  . Breast cancer Maternal Grandmother   . Bone cancer Maternal Grandmother   . Breast cancer Maternal Aunt   . Ovarian cancer Maternal Aunt   . Colon cancer Maternal Aunt        great aunt  . Rheum arthritis Sister   . Colon cancer Son     Past Surgical History:  Procedure Laterality Date  . ABDOMINAL HYSTERECTOMY    . BUNIONECTOMY Right 10/04/2014    Procedure:  Altamese Bradley, Emmaline Life;  Surgeon: Trula Slade,  DPM;  Location: [facility];  Service: Podiatry;  Laterality: Right;  . COLONOSCOPY WITH PROPOFOL  02/13/2013  . CYSTOSCOPY N/A 07/07/2017   Procedure: CYSTOSCOPY;  Surgeon: Aletha Halim, MD;  Location: [facility];  Service: Gynecology;  Laterality: N/A;  . DUPUYTREN / PALMAR FASCIOTOMY Right 07/18/2013   exc. of multiple nodules  . 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 HAMMER TOE SURGERY Right 10/04/2014   Procedure: HAMMER TOE REPAIR 4TH  AND 5TH  RIGHT FOOT  fourth toe fixation with k wire;  Surgeon: Trula Slade, DPM;  Location: [facility];  Service: Podiatry;  Laterality: Right;  . 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 TUBAL LIGATION    . VAGINAL HYSTERECTOMY N/A 07/07/2017   Procedure: HYSTERECTOMY VAGINAL;  Surgeon: Aletha Halim, MD;  Location: [facility];  Service: Gynecology;  Laterality: N/A;    ROS: Review of Systems Negative except as stated above  PHYSICAL EXAM: BP 113/76   Pulse 89   Temp 97.9 F (36.6 C)   Resp 16   Wt 172 lb 3.2 oz (78.1 kg)   LMP 07/04/2017   SpO2 98%   BMI 24.02 kg/m   Physical Exam General appearance - alert, well appearing, and in no distress and oriented to person, place, and time Mental status - alert, oriented to person, place, and time, normal mood, behavior, speech, dress, motor activity, and thought processes Eyes - pupils equal and reactive, extraocular eye movements intact Ears - bilateral TM's and external ear canals normal Nose - normal and patent, no erythema, discharge or polyps and normal nontender sinuses Mouth - mucous membranes moist, pharynx normal without lesions Neck - supple, no significant adenopathy Lymphatics - no palpable lymphadenopathy, no hepatosplenomegaly Chest - clear to auscultation, no wheezes, rales or rhonchi, symmetric air entry, no tachypnea,  retractions or cyanosis Heart - normal rate, regular rhythm, normal S1, S2, no murmurs, rubs, clicks or gallops Abdomen - soft, nontender, nondistended, no masses or organomegaly Neurological - alert, oriented, normal speech, no focal findings or movement disorder noted, neck supple without rigidity, cranial nerves II through XII intact, funduscopic exam normal, discs flat and sharp, DTR's normal and symmetric, motor and sensory grossly normal bilaterally, normal muscle tone, no tremors, strength 5/5, Romberg sign negative, normal gait and station  CMP Latest Ref Rng & Units 12/28/2018 02/13/2018 01/02/2018  Glucose 65 - 99 mg/dL 131(H) 98 93  BUN 6 - 24 mg/dL '7 10 9  '$ Creatinine 0.57 - 1.00 mg/dL 0.71 0.97 0.84  Sodium 134 - 144 mmol/L 139 141 136  Potassium 3.5 - 5.2 mmol/L 3.8 4.2 3.9  Chloride 96 - 106 mmol/L 99 102 97  CO2 20 - 29 mmol/L '25 23 23  '$ Calcium 8.7 - 10.2 mg/dL 9.9 9.5 9.9  Total Protein 6.0 - 8.5 g/dL 7.0 7.0 7.5  Total Bilirubin 0.0 - 1.2 mg/dL 0.3 <0.2 0.2  Alkaline Phos 39 - 117 IU/L 76 96 92  AST 0 - 40 IU/L '16 14 20  '$ ALT 0 - 32 IU/L '10 12 13   '$ Lipid Panel     Component Value Date/Time   CHOL 207 (H) 12/28/2018 0944   TRIG 94 12/28/2018 0944   HDL 69 12/28/2018 0944   CHOLHDL 3.0 12/28/2018 0944   LDLCALC 121 (H) 12/28/2018 0944    CBC    Component Value Date/Time   WBC 7.3 12/28/2018 0944   WBC  8.1 03/02/2018 1630   RBC 4.77 12/28/2018 0944   RBC 4.41 03/02/2018 1630   HGB 13.9 12/28/2018 0944   HCT 42.1 12/28/2018 0944   PLT 312 12/28/2018 0944   MCV 88 12/28/2018 0944   MCH 29.1 12/28/2018 0944   MCH 29.5 03/02/2018 1630   MCHC 33.0 12/28/2018 0944   MCHC 34.1 03/02/2018 1630   RDW 15.0 12/28/2018 0944   LYMPHSABS 2.0 12/28/2018 0944   MONOABS 0.7 02/17/2016 1820   EOSABS 0.2 12/28/2018 0944   BASOSABS 0.1 12/28/2018 0944    ASSESSMENT AND PLAN: 1. Essential hypertension: -Blood pressure at goal at today's visit. Continue Amlodipine as  prescribed.  -Counseled on blood pressure goal of less than 130/80, low-sodium, DASH diet, medication compliance, 150 minutes of moderate intensity exercise per week. Discussed medication compliance, adverse effects. . -Follow a Healthy Eating Plan - You can do it! . Limit sugary drinks.  Avoid sodas, sweet tea, sport or energy drinks, or fruit drinks.  Drink water, lo-fat milk, or diet drinks. . Limit snack foods.   Cut back on candy, cake, cookies, chips, ice cream.  These are a special treat, only in small amounts. . Eat plenty of vegetables.  Especially dark green, red, and orange vegetables. Aim for at least 3 servings a day. More is better! Include fruit in your daily diet.  Whole fruit is much healthier than fruit juice! . Limit "white" bread, "white" pasta, "white" rice.   Choose "100% whole grain" products, brown or wild rice. Marland Kitchen Avoid fatty meats. Try "Meatless Monday" and choose eggs or beans one day a week.  When eating meat, choose lean meats like chicken, [place], and fish.  Grill, broil, or bake meats instead of frying, and eat poultry without the skin. . Eat less salt.  Avoid frozen pizzas, frozen dinners and salty foods.  Use seasonings other than salt in cooking.  This can help blood pressure and keep you from swelling . Beer, wine and liquor have calories.  If you can safely drink alcohol, limit to 1 drink per day for women, 2 drinks for men -Follow-up in 3 months with primary provider - amLODipine (NORVASC) 10 MG tablet; Take 1 tablet (10 mg total) by mouth daily.  Dispense: 90 tablet; Refill: 0 - CMP14+EGFR - CBC With Differential  2. Pre-diabetes: -Discussed the importance of healthy eating habits, low-carbohydrate diet, low-sugar diet, regular aerobic exercise (at least 150 minutes a week as tolerated) and medication compliance to achieve or maintain control of diabetes. -To achieve an A1C goal of less than or equal to 7.0 percent, a fasting blood sugar of 80 to 130 mg/dL and a  postprandial glucose (90 to 120 minutes after a meal) less than 180 mg/dL. In the event of sugars less than 60 mg/dl or greater than 400 mg/dl please notify the clinic ASAP. It is recommended that you undergo annual eye exams and annual foot exams. -Check blood sugars at least once per day - Hemoglobin A1c - metFORMIN (GLUCOPHAGE) 500 MG tablet; Take 1 tablet (500 mg total) by mouth daily with breakfast.  Dispense: 30 tablet; Refill: 2 -Follow-up with primary provider in 3 months  3. Mixed hyperlipidemia: -Counseled on importance of low-fat diet and at least 150 minutes of moderate intensity exercise weekly - rosuvastatin (CRESTOR) 20 MG tablet; Take 1 tablet (20 mg total) by mouth daily. To lower cholesterol  Dispense: 30 tablet; Refill: 2 -Fasting lipid panel to be collected future  4. Generalized anxiety disorder: -Educated patient that  counseling sessions may still be beneficial for long-term management of anxiety even if she feels that it has improved. - clonazePAM (KLONOPIN) 0.5 MG tablet; TAKE 1/2 TABLET BY MOUTH TWICE DAILY AS NEEDED FOR ANXIETY; Appointment needed  Dispense: 30 tablet; Refill: 0  5. Fibromyalgia: - gabapentin (NEURONTIN) 300 MG capsule; TAKE 1 CAPSULE BY MOUTH 2 TIMES DAILY AND 2 CAPSULES AT BEDTIME.  Dispense: 120 capsule; Refill: 1  6. Dupuytren's contracture of both hands: -Continue Tramadol for pain management. Medication not due for refill until 07/29/2019. -Follow-up at that time with primary physician for refill.  7. Gastroesophageal reflux disease without esophagitis: - omeprazole (PRILOSEC) 20 MG capsule; Take 2 capsules (40 mg total) by mouth daily.  Dispense: 60 capsule; Refill: 2  8. Nausea: - ondansetron (ZOFRAN-ODT) 8 MG disintegrating tablet; Take 1 tablet (8 mg total) by mouth daily as needed for nausea or vomiting.  Dispense: 20 tablet; Refill: 2  Patient was given the opportunity to ask questions.  Patient verbalized understanding of the plan and  was able to repeat key elements of the plan. Patient was given clear instructions to go to Emergency Department or return to medical center if symptoms don't improve, worsen, or new problems develop.The patient verbalized understanding.   Requested Prescriptions    No prescriptions requested or ordered in this encounter     Marelly Wehrman Zachery Dauer, NP

## 2019-07-07 LAB — CMP14+EGFR
ALT: 12 IU/L (ref 0–32)
AST: 19 IU/L (ref 0–40)
Albumin/Globulin Ratio: 2.1 (ref 1.2–2.2)
Albumin: 4.8 g/dL (ref 3.8–4.8)
Alkaline Phosphatase: 72 IU/L (ref 39–117)
BUN/Creatinine Ratio: 12 (ref 9–23)
BUN: 8 mg/dL (ref 6–24)
Bilirubin Total: <0.2 mg/dL (ref 0.0–1.2)
CO2: 28 mmol/L (ref 20–29)
Calcium: 9.4 mg/dL (ref 8.7–10.2)
Chloride: 100 mmol/L (ref 96–106)
Creatinine, Ser: 0.67 mg/dL (ref 0.57–1.00)
GFR calc Af Amer: 120 mL/min/{1.73_m2} (ref >59)
GFR calc non Af Amer: 104 mL/min/{1.73_m2} (ref >59)
Globulin, Total: 2.3 g/dL (ref 1.5–4.5)
Glucose: 95 mg/dL (ref 65–99)
Potassium: 4.3 mmol/L (ref 3.5–5.2)
Sodium: 140 mmol/L (ref 134–144)
Total Protein: 7.1 g/dL (ref 6.0–8.5)

## 2019-07-07 LAB — LIPID PANEL
Chol/HDL Ratio: 2.2 ratio (ref 0.0–4.4)
Cholesterol, Total: 164 mg/dL (ref 100–199)
HDL: 75 mg/dL (ref >39)
LDL Chol Calc (NIH): 60 mg/dL (ref 0–99)
Triglycerides: 177 mg/dL — ABNORMAL HIGH (ref 0–149)
VLDL Cholesterol Cal: 29 mg/dL (ref 5–40)

## 2019-07-07 LAB — HEMOGLOBIN A1C
Est. average glucose Bld gHb Est-mCnc: 111 mg/dL
Hgb A1c MFr Bld: 5.5 % (ref 4.8–5.6)

## 2019-07-07 LAB — CBC WITH DIFFERENTIAL
Basophils Absolute: 0.1 10*3/uL (ref 0.0–0.2)
Basos: 1 %
EOS (ABSOLUTE): 0.2 10*3/uL (ref 0.0–0.4)
Eos: 2 %
Hematocrit: 40.1 % (ref 34.0–46.6)
Hemoglobin: 13.9 g/dL (ref 11.1–15.9)
Immature Grans (Abs): 0 10*3/uL (ref 0.0–0.1)
Immature Granulocytes: 0 %
Lymphocytes Absolute: 2.8 10*3/uL (ref 0.7–3.1)
Lymphs: 36 %
MCH: 31.2 pg (ref 26.6–33.0)
MCHC: 34.7 g/dL (ref 31.5–35.7)
MCV: 90 fL (ref 79–97)
Monocytes Absolute: 0.5 10*3/uL (ref 0.1–0.9)
Monocytes: 7 %
Neutrophils Absolute: 4.1 10*3/uL (ref 1.4–7.0)
Neutrophils: 54 %
RBC: 4.46 x10E6/uL (ref 3.77–5.28)
RDW: 13 % (ref 11.7–15.4)
WBC: 7.7 10*3/uL (ref 3.4–10.8)

## 2019-07-09 ENCOUNTER — Telehealth: Payer: Self-pay

## 2019-07-09 MED FILL — ONDANSETRON ODT 8 MG TABLET: 8 | 20 days supply | Qty: 20 | Fill #0

## 2019-07-09 MED FILL — metFORMIN HCL 500 MG TABS: 500 | 30 days supply | Qty: 30 | Fill #0

## 2019-07-09 MED FILL — ?ROSUVASTATIN CALC 20MG TA: 20 | 30 days supply | Qty: 30 | Fill #0

## 2019-07-09 NOTE — Progress Notes (Signed)
Kidney and liver function normal.  Blood count normal meaning no anemia.  Blood sugar normal.  Total cholesterol normal and please continue cholesterol medication Crestor.

## 2019-07-09 NOTE — Telephone Encounter (Signed)
Contacted pt to go over lab results pt is aware and doesn't have any questions or concerns 

## 2019-07-20 ENCOUNTER — Other Ambulatory Visit: Payer: Self-pay

## 2019-07-20 DIAGNOSIS — N644 Mastodynia: Secondary | ICD-10-CM

## 2019-07-20 DIAGNOSIS — N6320 Unspecified lump in the left breast, unspecified quadrant: Secondary | ICD-10-CM

## 2019-07-31 ENCOUNTER — Other Ambulatory Visit: Payer: Self-pay | Admitting: Family Medicine

## 2019-07-31 DIAGNOSIS — M2550 Pain in unspecified joint: Secondary | ICD-10-CM

## 2019-07-31 DIAGNOSIS — M62830 Muscle spasm of back: Secondary | ICD-10-CM

## 2019-07-31 DIAGNOSIS — M62838 Other muscle spasm: Secondary | ICD-10-CM

## 2019-07-31 MED FILL — ?METFORMIN HCL 500MG TABLET: 500 | 30 days supply | Qty: 30 | Fill #0

## 2019-07-31 MED FILL — ?IBUPROFEN 600 MG TABLETS: 600 | 10 days supply | Qty: 30 | Fill #0

## 2019-07-31 MED FILL — AMLODIPINE BESYLATE 10 MG T: 10 | 30 days supply | Qty: 30 | Fill #2

## 2019-07-31 MED FILL — DULoxetine HCL 20 MG CPEP: 20 | 30 days supply | Qty: 30 | Fill #2

## 2019-07-31 MED FILL — CYCLOBENZAPRINE 10 MG TAB: 10 | 30 days supply | Qty: 60 | Fill #0

## 2019-07-31 MED FILL — ?ROSUVASTATIN CALCIUM 20MG: 20 | 30 days supply | Qty: 30 | Fill #6

## 2019-07-31 MED FILL — ?OMEPRAZOLE 20 MG CAP DR: 20 | 30 days supply | Qty: 60 | Fill #8

## 2019-07-31 MED FILL — GABAPENTIN 300 MG CAPSULE: 300 | 30 days supply | Qty: 120 | Fill #0

## 2019-07-31 MED FILL — VENLAFAXINE HCL ER 150 MG C: 150 | 30 days supply | Qty: 30 | Fill #2

## 2019-08-01 ENCOUNTER — Other Ambulatory Visit: Payer: Self-pay | Admitting: Family

## 2019-08-01 ENCOUNTER — Other Ambulatory Visit: Payer: Self-pay | Admitting: Family Medicine

## 2019-08-01 DIAGNOSIS — F411 Generalized anxiety disorder: Secondary | ICD-10-CM

## 2019-08-03 ENCOUNTER — Other Ambulatory Visit: Payer: Self-pay | Admitting: Family

## 2019-08-03 DIAGNOSIS — F411 Generalized anxiety disorder: Secondary | ICD-10-CM

## 2019-08-07 ENCOUNTER — Ambulatory Visit
Admission: RE | Admit: 2019-08-07 | Discharge: 2019-08-07 | Disposition: A | Discharge status: Home / Self Care | Payer: Self-pay | Source: Ambulatory Visit | Attending: Obstetrics and Gynecology | Admitting: Obstetrics and Gynecology

## 2019-08-07 ENCOUNTER — Encounter: Payer: Self-pay | Admitting: Student

## 2019-08-07 ENCOUNTER — Ambulatory Visit: Payer: Self-pay | Admitting: Student

## 2019-08-07 ENCOUNTER — Ambulatory Visit: Payer: Self-pay

## 2019-08-07 ENCOUNTER — Other Ambulatory Visit: Payer: Self-pay

## 2019-08-07 VITALS — BP 115/80 | Temp 97.8°F | Wt 168.0 lb

## 2019-08-07 DIAGNOSIS — N644 Mastodynia: Secondary | ICD-10-CM

## 2019-08-07 DIAGNOSIS — N6320 Unspecified lump in the left breast, unspecified quadrant: Secondary | ICD-10-CM

## 2019-08-07 DIAGNOSIS — Z1239 Encounter for other screening for malignant neoplasm of breast: Secondary | ICD-10-CM

## 2019-08-07 NOTE — Progress Notes (Signed)
[name] is a 49 y.o. female who presents to [facility] clinic today with complaint of right breast pain around nipple area. Symptoms for over 6 months & is intermittent. Scheduled for a mammogram later today for f/u r/t left breast mass.    Pap Smear: Pap not smear completed today. Last Pap smear was 09/22/2016 at [facility] and was normal. Per patient has history of an abnormal Pap smear. Last Pap smear result is available in Epic. She had a hysterectomy in 2019 for AUB & fibroids & no longer needs pap smears.    Physical exam: Breasts Breasts symmetrical. No skin abnormalities bilateral breasts. No nipple retraction bilateral breasts. No nipple discharge bilateral breasts. No lymphadenopathy. No lumps palpated bilateral breasts.   Dense tissue palpated outer bilateral breasts, no masses.    Pelvic/Bimanual Pap is not indicated today    Smoking History: Patient has is a former smoker.    Patient Navigation: Patient education provided. Access to services provided for patient through [facility] program.    Colorectal Cancer Screening: Per patient has had colonoscopy completed on 02/13/2013 No complaints today.    Breast and Cervical Cancer Risk Assessment: Patient has family history of breast cancer (maternal grandmother & maternal aunt), known genetic mutations, or radiation treatment to the chest before age 57. Patient does not have history of cervical dysplasia, immunocompromised, or DES exposure in-utero.  Risk Assessment    No risk assessment data for the current encounter   Risk Scores      04/20/2018   Last edited by: Armond Hang, LPN   5-year risk: 0.8 %   Lifetime risk: 7.6 %          A: [facility]CP exam without pap smear Complaint of right breast pain  P: Referred patient to the [facility] for a diagnostic mammogram. Appointment scheduled later today.  Jorje Guild, NP 08/07/2019 8:38 AM

## 2019-08-10 ENCOUNTER — Ambulatory Visit: Payer: Self-pay | Admitting: Family Medicine

## 2019-08-23 ENCOUNTER — Other Ambulatory Visit: Payer: Self-pay

## 2019-08-23 ENCOUNTER — Ambulatory Visit: Payer: Self-pay | Attending: Family Medicine | Admitting: Family Medicine

## 2019-08-23 ENCOUNTER — Encounter: Payer: Self-pay | Admitting: Family Medicine

## 2019-08-23 VITALS — BP 118/85 | HR 78 | Temp 99.3°F | Ht 71.0 in | Wt 167.8 lb

## 2019-08-23 DIAGNOSIS — F411 Generalized anxiety disorder: Secondary | ICD-10-CM

## 2019-08-23 DIAGNOSIS — M2550 Pain in unspecified joint: Secondary | ICD-10-CM

## 2019-08-23 DIAGNOSIS — E782 Mixed hyperlipidemia: Secondary | ICD-10-CM

## 2019-08-23 DIAGNOSIS — R634 Abnormal weight loss: Secondary | ICD-10-CM

## 2019-08-23 DIAGNOSIS — M542 Cervicalgia: Secondary | ICD-10-CM

## 2019-08-23 DIAGNOSIS — R194 Change in bowel habit: Secondary | ICD-10-CM

## 2019-08-23 DIAGNOSIS — K5900 Constipation, unspecified: Secondary | ICD-10-CM

## 2019-08-23 MED ORDER — CLONAZEPAM 0.5 MG PO TABS: ORAL_TABLET | ORAL | 2 refills | Status: DC

## 2019-08-23 MED ORDER — DICLOFENAC SODIUM 50 MG PO TBEC: 50.0000 mg | DELAYED_RELEASE_TABLET | Freq: Two times a day (BID) | ORAL | 3 refills | Status: DC

## 2019-08-23 MED FILL — clonazePAM 0.5 MG TABS: 0.5 | 30 days supply | Qty: 30 | Fill #0

## 2019-08-23 NOTE — Progress Notes (Signed)
Subjective:  Patient ID: Isabel Evans, female    DOB: 1971-02-11  Age: 49 y.o. MRN: [id number]  CC: No chief complaint on file.   HPI Isabel Evans, 49 year old female with chronic medical issues including hypertension, prediabetes, hyperlipidemia, generalized anxiety disorder and chronic pain issues, who was last seen in the office by another provider on 07/06/2019 in follow-up of chronic issues.  She returns today secondary to complaint of abnormal weight loss, continued chronic constipation and change in bowel habits.  She also requests refill of clonazepam to help with her chronic anxiety.  She continues to take Effexor.       She denies any issues with difficulty swallowing.  She does sometimes feel as if she gets full and cannot eat as much.  She continues to have issues with chronic constipation.  She has noticed that her stools seem to be thinner at times than they were in the past.  She denies any change in color of the stool.  She has seen no blood in the stool no black stools.  She has had a colonoscopy in the past which she reports was normal.  She has had no increase or decrease in her level of activity.  She continues to work at a grill.  She continues to have chronic issues with low back and neck pain as well as hand pain secondary to her Dupuytren's contractures.  She is a hand specialist on the hand specialist told her that her hands would likely be worse after any surgical intervention as she might develop new contractures.  Past Medical History:  Diagnosis Date  . Anemia 11/24/2017  . Anxiety   . Bone spur    cervical spine  . Chronic kidney disease 04/2017   kidney infections  . Depression   . Deviated nasal septum    states is unable to lie flat, because her nose will become congested and she can stop breathing  . Diabetes mellitus without complication ([place])   . Dysrhythmia    heart flutters occasionally  . Fibromyalgia   . Fibromyalgia   . GERD  (gastroesophageal reflux disease)   . Hallux abductovalgus with bunions 09/2014   right   . Hammertoe 09/2014   right 4th, 5th  . History of stomach ulcers   . Hyperlipidemia   . Hypertension    states is under control with med., has been on med. x 8 mos.  . IBS (irritable bowel syndrome)    no current med.  . Osteoarthritis    hands, bilateral hips  . Peripheral vascular disease (HCC)    occasional swelling in both legs  . Sinus headache     Past Surgical History:  Procedure Laterality Date  . ABDOMINAL HYSTERECTOMY    . BREAST BIOPSY Left 04/2018   benign  . BREAST BIOPSY Left 2016   benign  . BUNIONECTOMY Right 10/04/2014   Procedure:  Isabel Evans, Isabel Evans;  Surgeon: Isabel Evans, DPM;  Location: [facility];  Service: Podiatry;  Laterality: Right;  . COLONOSCOPY WITH PROPOFOL  02/13/2013  . CYSTOSCOPY N/A 07/07/2017   Procedure: CYSTOSCOPY;  Surgeon: Isabel Halim, MD;  Location: [facility];  Service: Gynecology;  Laterality: N/A;  . DUPUYTREN / PALMAR FASCIOTOMY Right 07/18/2013   exc. of multiple nodules  . 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 HAMMER TOE SURGERY Right 10/04/2014   Procedure: HAMMER TOE REPAIR 4TH  AND 5TH  RIGHT FOOT  fourth toe  fixation with k wire;  Surgeon: Isabel Evans, DPM;  Location: [facility];  Service: Podiatry;  Laterality: Right;  . 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 TUBAL LIGATION    . VAGINAL HYSTERECTOMY N/A 07/07/2017   Procedure: HYSTERECTOMY VAGINAL;  Surgeon: Isabel Halim, MD;  Location: [facility];  Service: Gynecology;  Laterality: N/A;    Family History  Problem Relation Age of Onset  . Hypertension Mother   . Diabetes Mother   . Heart disease Mother   . Hypertension Father   . Diabetes Father   . Prostate cancer Father   . Heart disease Father   . Alcohol abuse Father   . Hypertension Sister   . Diabetes Sister   .  Heart disease Maternal Grandmother        Great GM  . Breast cancer Maternal Grandmother   . Bone cancer Maternal Grandmother   . Breast cancer Maternal Aunt   . Colon cancer Maternal Aunt   . Ovarian cancer Maternal Aunt   . Rheum arthritis Sister     Social History   Tobacco Use  . Smoking status: Former Smoker    Packs/day: 1.00    Years: 20.00    Pack years: 20.00    Types: Cigarettes    Quit date: 08/24/2012    Years since quitting: 7.0  . Smokeless tobacco: Never Used  Substance Use Topics  . Alcohol use: Yes    Comment: occasionally    ROS Review of Systems  Constitutional: Positive for fatigue and unexpected weight change. Negative for chills and fever.  HENT: Negative for sore throat and trouble swallowing.   Eyes: Negative for photophobia and visual disturbance.  Respiratory: Negative for cough and shortness of breath.   Cardiovascular: Negative for chest pain and palpitations.  Gastrointestinal: Positive for constipation. Negative for abdominal pain, blood in stool and diarrhea.  Endocrine: Negative for cold intolerance, heat intolerance, polydipsia, polyphagia and polyuria.  Genitourinary: Negative for dysuria and frequency.  Musculoskeletal: Positive for arthralgias, back pain, neck pain and neck stiffness.  Skin: Negative for rash and wound.  Neurological: Negative for dizziness and headaches.  Hematological: Negative for adenopathy. Does not bruise/bleed easily.  Psychiatric/Behavioral: Negative for self-injury. The patient is nervous/anxious.     Objective:   Today's Vitals: BP 118/85   Pulse 78   Temp 99.3 F (37.4 C) (Temporal)   Ht 5\' 11"  (1.803 m)   Wt 167 lb 12.8 oz (76.1 kg)   LMP 07/04/2017   SpO2 98%   BMI 23.40 kg/m   Physical Exam  Assessment & Plan:  1. Change in bowel habits 2. Abnormal weight loss 3. Constipation, unspecified constipation type She reports unexplained weight loss as well as stools that are thinner as well as  issues with constipation.  Discussed with the patient that I recommend that she follow-up with gastroenterology for colonoscopy.  I reviewed her chart, last colonoscopy was in 2014 and normal at that time.  She will also have T4/TSH to see if thyroid disorder could be contributing to her weight loss or constipation.  She will also have CBC to look for blood loss.  Review of chart, patient with a weight of 172 pounds on 07/06/2019 and 262 pounds at today's visit.  She would like to return tomorrow for fasting lipid panel and will have T4/TSH and CBC at that time. - T4 AND TSH - CBC -Ambulatory referral to gastroenterology  4.  Pain in joint, multiple sites; 7.  Neck pain She continues to have issues of multiple joint pain Including chronic neck and back pain due to degenerative disc disease as well as Dupuytren contractures of her hands.  She reports that ibuprofen 600 is no longer helpful.  We will have patient try diclofenac 50 mg twice daily after meal to help with joint pain.  She also takes tramadol as needed. - diclofenac (VOLTAREN) 50 MG EC tablet; Take 1 tablet (50 mg total) by mouth 2 (two) times daily. After a meal as needed for pain  Dispense: 60 tablet; Refill: 3  5. Mixed hyperlipidemia She will return in the morning for fasting lipid panel in follow-up of mixed hyperlipidemia.  Continue rosuvastatin and low-fat diet. - Lipid panel; Future  6. Generalized anxiety disorder Refill provided of clonazepam for continued treatment of chronic issues with anxiety and she is to additionally continue the use of Effexor.  She declines referral to speak with medical social worker - clonazePAM (KLONOPIN) 0.5 MG tablet; TAKE 1/2 TABLET BY MOUTH TWICE DAILY AS NEEDED FOR ANXIETY  Dispense: 30 tablet; Refill: 2   Outpatient Encounter Medications as of 08/23/2019  Medication Sig  . amLODipine (NORVASC) 10 MG tablet Take 1 tablet (10 mg total) by mouth daily.  . clonazePAM (KLONOPIN) 0.5 MG tablet TAKE  1/2 TABLET BY MOUTH TWICE DAILY AS NEEDED FOR ANXIETY  . cyclobenzaprine (FLEXERIL) 10 MG tablet TAKE ONE TABLET BY MOUTH TWICE DAILY AS NEEDED FOR MUSCLE SPASM  . dicyclomine (BENTYL) 10 MG capsule TAKE 1 CAPSULE BY MOUTH 4 TIMES DAILY AS NEEDED FOR SPASMS.  Marland Kitchen diphenhydrAMINE (BENADRYL) 25 MG tablet Take 25 mg by mouth daily as needed for allergies.  . DULoxetine (CYMBALTA) 20 MG capsule TAKE 1 CAPSULE BY MOUTH DAILY.  . fluticasone (FLONASE) 50 MCG/ACT nasal spray PLACE 2 SPRAYS INTO BOTH NOSTRILS DAILY.  Marland Kitchen gabapentin (NEURONTIN) 300 MG capsule TAKE 1 CAPSULE BY MOUTH 2 TIMES DAILY AND 2 CAPSULES AT BEDTIME.  . metFORMIN (GLUCOPHAGE) 500 MG tablet Take 1 tablet (500 mg total) by mouth daily with breakfast.  . omeprazole (PRILOSEC) 20 MG capsule Take 2 capsules (40 mg total) by mouth daily.  . ondansetron (ZOFRAN-ODT) 8 MG disintegrating tablet Take 1 tablet (8 mg total) by mouth daily as needed for nausea or vomiting.  . rosuvastatin (CRESTOR) 20 MG tablet Take 1 tablet (20 mg total) by mouth daily. To lower cholesterol  . traMADol (ULTRAM) 50 MG tablet TAKE 1 TABLET (50 MG TOTAL) BY MOUTH 3 (THREE) TIMES DAILY. AS NEEDED FOR PAIN. 30 DAY SUPPLY  . venlafaxine XR (EFFEXOR-XR) 150 MG 24 hr capsule TAKE 1 CAPSULE BY MOUTH DAILY WITH BREAKFAST.  . [DISCONTINUED] clonazePAM (KLONOPIN) 0.5 MG tablet TAKE 1/2 TABLET BY MOUTH TWICE DAILY AS NEEDED FOR ANXIETY; Appointment needed  . [DISCONTINUED] ibuprofen (ADVIL) 600 MG tablet TAKE 1 TABLET (600 MG TOTAL) BY MOUTH EVERY 8 (EIGHT) HOURS AS NEEDED FOR MODERATE PAIN. TAKE AFTER EATING  . diclofenac (VOLTAREN) 50 MG EC tablet Take 1 tablet (50 mg total) by mouth 2 (two) times daily. After a meal as needed for pain   No facility-administered encounter medications on file as of 08/23/2019.    An After Visit Summary was printed and given to the patient.   Follow-up: Return in about 6 weeks (around 10/04/2019) for weight loss; back/neck pain/GAD.     Antony Blackbird MD

## 2019-08-23 NOTE — Progress Notes (Signed)
1 mo f/u  Weight loss  Bm not coming as normal as they should  Check lipids  Back shoulder and neck pain

## 2019-08-24 MED FILL — DICLOFENAC SOD EC 50 MG TAB: 50 | 30 days supply | Qty: 60 | Fill #0

## 2019-08-25 ENCOUNTER — Encounter: Payer: Self-pay | Admitting: Family Medicine

## 2019-08-25 LAB — CBC
Hematocrit: 41 % (ref 34.0–46.6)
Hemoglobin: 13.9 g/dL (ref 11.1–15.9)
MCH: 30.4 pg (ref 26.6–33.0)
MCHC: 33.9 g/dL (ref 31.5–35.7)
MCV: 90 fL (ref 79–97)
Platelets: 247 x10E3/uL (ref 150–450)
RBC: 4.57 x10E6/uL (ref 3.77–5.28)
RDW: 13.1 % (ref 11.7–15.4)
WBC: 5.7 x10E3/uL (ref 3.4–10.8)

## 2019-08-25 LAB — T4 AND TSH
T4, Total: 6.1 ug/dL (ref 4.5–12.0)
TSH: 1.01 u[IU]/mL (ref 0.450–4.500)

## 2019-08-25 LAB — LIPID PANEL
Chol/HDL Ratio: 2.1 ratio (ref 0.0–4.4)
Cholesterol, Total: 147 mg/dL (ref 100–199)
HDL: 69 mg/dL
LDL Chol Calc (NIH): 62 mg/dL (ref 0–99)
Triglycerides: 87 mg/dL (ref 0–149)
VLDL Cholesterol Cal: 16 mg/dL (ref 5–40)

## 2019-08-27 ENCOUNTER — Other Ambulatory Visit: Payer: Self-pay | Admitting: Family Medicine

## 2019-08-27 DIAGNOSIS — M545 Low back pain: Secondary | ICD-10-CM

## 2019-08-27 DIAGNOSIS — F32A Depression, unspecified: Secondary | ICD-10-CM

## 2019-08-27 DIAGNOSIS — F329 Major depressive disorder, single episode, unspecified: Secondary | ICD-10-CM

## 2019-08-27 DIAGNOSIS — G8929 Other chronic pain: Secondary | ICD-10-CM

## 2019-08-27 DIAGNOSIS — M720 Palmar fascial fibromatosis [Dupuytren]: Secondary | ICD-10-CM

## 2019-08-27 DIAGNOSIS — M797 Fibromyalgia: Secondary | ICD-10-CM

## 2019-08-29 ENCOUNTER — Encounter: Payer: Self-pay | Admitting: Cardiovascular Disease

## 2019-10-03 MED FILL — ?OMEPRAZOLE 20 MG CAP DR: 20 | 30 days supply | Qty: 60 | Fill #10

## 2019-10-03 MED FILL — METFORMIN HCL 500 MG TABS: 500 | 30 days supply | Qty: 30 | Fill #2

## 2019-10-03 MED FILL — clonazePAM 0.5 MG TABS: 0.5 | 30 days supply | Qty: 30 | Fill #1

## 2019-10-03 MED FILL — ?DULOXETINE HCL 20MG CPEP: 20 | 30 days supply | Qty: 30 | Fill #1

## 2019-10-03 MED FILL — ROSUVASTATIN CALCIUM 20 MG: 20 | 30 days supply | Qty: 30 | Fill #8

## 2019-10-03 MED FILL — DICLOFENAC SOD EC 50 MG TAB: 50 | 30 days supply | Qty: 60 | Fill #1

## 2019-10-03 MED FILL — traMADol HCL 50 MG TABS: 50 | 30 days supply | Qty: 90 | Fill #1

## 2019-10-03 MED FILL — ?AMLODIPINE BESYL 10MG TABL: 10 | 30 days supply | Qty: 30 | Fill #1

## 2019-10-03 MED FILL — ONDANSETRON ODT 8 MG TABLET: 8 | 20 days supply | Qty: 20 | Fill #0

## 2019-10-04 ENCOUNTER — Ambulatory Visit: Payer: Self-pay | Admitting: Family Medicine

## 2019-10-08 ENCOUNTER — Other Ambulatory Visit: Payer: Self-pay | Admitting: Family Medicine

## 2019-10-08 DIAGNOSIS — K582 Mixed irritable bowel syndrome: Secondary | ICD-10-CM

## 2019-10-08 MED FILL — DICYCLOMINE 10 MG CAPSULE: 10 | 15 days supply | Qty: 60 | Fill #0

## 2019-10-09 ENCOUNTER — Other Ambulatory Visit: Payer: Self-pay | Admitting: Family Medicine

## 2019-10-09 DIAGNOSIS — F411 Generalized anxiety disorder: Secondary | ICD-10-CM

## 2019-10-10 ENCOUNTER — Other Ambulatory Visit: Payer: Self-pay | Admitting: Family Medicine

## 2019-10-10 MED FILL — VENLAFAXINE HCL ER 150 MG C: 150 | 30 days supply | Qty: 30 | Fill #0

## 2019-10-29 ENCOUNTER — Other Ambulatory Visit: Payer: Self-pay | Admitting: Family Medicine

## 2019-10-29 ENCOUNTER — Other Ambulatory Visit: Payer: Self-pay | Admitting: Family

## 2019-10-29 DIAGNOSIS — G8929 Other chronic pain: Secondary | ICD-10-CM

## 2019-10-29 DIAGNOSIS — M25552 Pain in left hip: Secondary | ICD-10-CM

## 2019-10-29 DIAGNOSIS — M797 Fibromyalgia: Secondary | ICD-10-CM

## 2019-10-29 DIAGNOSIS — M62838 Other muscle spasm: Secondary | ICD-10-CM

## 2019-10-29 DIAGNOSIS — M62830 Muscle spasm of back: Secondary | ICD-10-CM

## 2019-10-29 DIAGNOSIS — M720 Palmar fascial fibromatosis [Dupuytren]: Secondary | ICD-10-CM

## 2019-10-29 DIAGNOSIS — M545 Low back pain, unspecified: Secondary | ICD-10-CM

## 2019-10-29 MED FILL — VENLAFAXINE HCL ER 150 MG C: 150 | 30 days supply | Qty: 30 | Fill #0

## 2019-10-29 MED FILL — ?DULOXETINE HCL 20MG CPEP: 20 | 30 days supply | Qty: 30 | Fill #2

## 2019-10-29 MED FILL — clonazePAM 0.5 MG TABS: 0.5 | 30 days supply | Qty: 30 | Fill #2

## 2019-10-29 MED FILL — DICLOFENAC SOD EC 50 MG TAB: 50 | 30 days supply | Qty: 60 | Fill #2

## 2019-10-29 MED FILL — DICYCLOMINE 10 MG CAPSULE: 10 | 15 days supply | Qty: 60 | Fill #0

## 2019-10-29 MED FILL — ROSUVASTATIN CALCIUM 20 MG: 20 | 30 days supply | Qty: 30 | Fill #9

## 2019-10-29 MED FILL — ONDANSETRON ODT 8 MG TABLET: 8 | 20 days supply | Qty: 20 | Fill #1

## 2019-10-29 MED FILL — METFORMIN HCL 500 MG TABS: 500 | 30 days supply | Qty: 30 | Fill #2

## 2019-10-29 MED FILL — ?AMLODIPINE BESYL 10MG TABL: 10 | 30 days supply | Qty: 30 | Fill #2

## 2019-10-29 NOTE — Telephone Encounter (Signed)
Requested medication (s) are due for refill today: yes  Requested medication (s) are on the active medication list: yes  Last refill:  08/28/19 #90 with 1 refill  Future visit scheduled: No  Notes to clinic:  Please review for refill. Refill not delegated per protocol.    Requested Prescriptions  Pending Prescriptions Disp Refills   traMADol (ULTRAM) 50 MG tablet [Pharmacy Med Name: traMADol HCL 50 MG TABS 50 Tablet] 90 tablet 1    Sig: TAKE 1 TABLET (50 MG TOTAL) BY MOUTH 3 (THREE) TIMES DAILY. AS NEEDED FOR PAIN. 30 DAY SUPPLY      Not Delegated - Analgesics:  Opioid Agonists Failed - 10/29/2019  3:47 PM      Failed - This refill cannot be delegated      Failed - Urine Drug Screen completed in last 360 days.      Passed - Valid encounter within last 6 months    Recent Outpatient Visits           2 months ago Change in bowel habits   [place], MD   3 months ago Essential hypertension   [place], [place], NP   6 months ago COVID-19 virus detected   [place], [place], [place]   10 months ago Palpitations   [facility] [place], [place], MD   1 year ago DDD (degenerative disc disease), cervical   [facility] [place], [place], MD

## 2019-10-29 NOTE — Telephone Encounter (Signed)
Requested medication (s) are due for refill today: yes  Requested medication (s) are on the active medication list:yes  Last refill:  07/31/19  #60  1 refill  Future visit scheduled:No  Notes to clinic:  Med not delegated    Requested Prescriptions  Pending Prescriptions Disp Refills   cyclobenzaprine (FLEXERIL) 10 MG tablet [Pharmacy Med Name: CYCLOBENZAPRINE 10 MG TAB 10 Tablet] 60 tablet 1    Sig: TAKE ONE TABLET BY MOUTH TWICE DAILY AS NEEDED FOR MUSCLE SPASM      Not Delegated - Analgesics:  Muscle Relaxants Failed - 10/29/2019  3:50 PM      Failed - This refill cannot be delegated      Passed - Valid encounter within last 6 months    Recent Outpatient Visits           2 months ago Change in bowel habits   [place], MD   3 months ago Essential hypertension   [place], [place], NP   6 months ago COVID-19 virus detected   [place], [place], [place]   10 months ago Palpitations   [facility] Fulp, [place], MD   1 year ago DDD (degenerative disc disease), cervical   [facility] [place], [place], MD

## 2019-10-30 MED ORDER — TRAMADOL HCL 50 MG PO TABS: ORAL_TABLET | ORAL | 0 refills | Status: DC

## 2019-10-30 MED FILL — GABAPENTIN 300 MG CAPSULE: 300 | 30 days supply | Qty: 120 | Fill #0

## 2019-10-30 MED FILL — CYCLOBENZAPRINE 10 MG TAB: 10 | 30 days supply | Qty: 60 | Fill #0

## 2019-10-31 MED FILL — ?OMEPRAZOLE 20 MG CPDR: 20 | 30 days supply | Qty: 60 | Fill #11

## 2019-10-31 MED FILL — traMADol HCL 50 MG TABS: 50 | 30 days supply | Qty: 90 | Fill #0

## 2019-10-31 MED FILL — FLUTICASONE PROP 50 MCG SPR: 50 | 20 days supply | Qty: 16 | Fill #1

## 2019-11-14 ENCOUNTER — Telehealth: Payer: Self-pay | Admitting: Family Medicine

## 2019-11-14 NOTE — Telephone Encounter (Signed)
Please advise.  Copied from [place] [id number]. Topic: Referral - Request for Referral >> Nov 14, 2019 10:42 AM Erick Blinks wrote: Has patient seen PCP for this complaint? Yes.   *If NO, is insurance requiring patient see PCP for this issue before PCP can refer them? Referral for which specialty: Gastroenterology  Preferred provider/office: highest recommended  Reason for referral: Colonoscopy needed

## 2019-12-04 ENCOUNTER — Other Ambulatory Visit: Payer: Self-pay | Admitting: Family Medicine

## 2019-12-04 ENCOUNTER — Other Ambulatory Visit: Payer: Self-pay | Admitting: Family

## 2019-12-04 DIAGNOSIS — M25552 Pain in left hip: Secondary | ICD-10-CM

## 2019-12-04 DIAGNOSIS — M797 Fibromyalgia: Secondary | ICD-10-CM

## 2019-12-04 DIAGNOSIS — R7303 Prediabetes: Secondary | ICD-10-CM

## 2019-12-04 DIAGNOSIS — G8929 Other chronic pain: Secondary | ICD-10-CM

## 2019-12-04 DIAGNOSIS — I10 Essential (primary) hypertension: Secondary | ICD-10-CM

## 2019-12-04 DIAGNOSIS — F411 Generalized anxiety disorder: Secondary | ICD-10-CM

## 2019-12-04 DIAGNOSIS — F32A Depression, unspecified: Secondary | ICD-10-CM

## 2019-12-04 DIAGNOSIS — M720 Palmar fascial fibromatosis [Dupuytren]: Secondary | ICD-10-CM

## 2019-12-04 DIAGNOSIS — F329 Major depressive disorder, single episode, unspecified: Secondary | ICD-10-CM

## 2019-12-04 MED FILL — ROSUVASTATIN CALCIUM 20 MG: 20 | 30 days supply | Qty: 30 | Fill #10

## 2019-12-04 MED FILL — ONDANSETRON ODT 8 MG TABLET: 8 | 20 days supply | Qty: 20 | Fill #2

## 2019-12-04 MED FILL — VENLAFAXINE HCL ER 150 MG C: 150 | 30 days supply | Qty: 30 | Fill #1

## 2019-12-04 MED FILL — DICLOFENAC SOD EC 50 MG TAB: 50 | 30 days supply | Qty: 60 | Fill #3

## 2019-12-04 MED FILL — ?OMEPRAZOLE 20 MG CPDR: 20 | 30 days supply | Qty: 60 | Fill #0

## 2019-12-04 MED FILL — CYCLOBENZAPRINE 10 MG TAB: 10 | 30 days supply | Qty: 60 | Fill #1

## 2019-12-04 NOTE — Telephone Encounter (Signed)
Requested medication (s) are due for refill today:  Yes  Requested medication (s) are on the active medication list:  Yes  Future visit scheduled:  Yes  Last Refill: 08/23/19; #30/ RF x 2   Note to clinic: not delegated.  Requested Prescriptions  Pending Prescriptions Disp Refills   clonazePAM (KLONOPIN) 0.5 MG tablet [Pharmacy Med Name: clonazePAM 0.5 MG TABS 0.5 Tablet] 30 tablet 2    Sig: TAKE 1/2 TABLET BY MOUTH TWICE DAILY AS NEEDED FOR ANXIETY      Not Delegated - Psychiatry:  Anxiolytics/Hypnotics Failed - 12/04/2019  3:38 PM      Failed - This refill cannot be delegated      Failed - Urine Drug Screen completed in last 360 days.      Passed - Valid encounter within last 6 months    Recent Outpatient Visits           3 months ago Change in bowel habits   [place], MD   5 months ago Essential hypertension   [place], [place], NP   8 months ago COVID-19 virus detected   [place], [place], [place]   11 months ago [place], [place], MD   1 year ago DDD (degenerative disc disease), cervical   [place], MD       Future Appointments             In 3 weeks Camillia Herter, NP [place]

## 2019-12-04 NOTE — Telephone Encounter (Signed)
Courtesy refill . Future visit in 3 weeks  

## 2019-12-04 NOTE — Telephone Encounter (Signed)
Requested Prescriptions  Pending Prescriptions Disp Refills   traMADol (ULTRAM) 50 MG tablet [Pharmacy Med Name: traMADol HCL 50 MG TABS 50 Tablet] 90 tablet 0    Sig: TAKE 1 TABLET (50 MG TOTAL) BY MOUTH 3 (THREE) TIMES DAILY. AS NEEDED FOR PAIN. (30 DAY SUPPLY)     Not Delegated - Analgesics:  Opioid Agonists Failed - 12/04/2019  3:38 PM      Failed - This refill cannot be delegated      Failed - Urine Drug Screen completed in last 360 days.      Passed - Valid encounter within last 6 months    Recent Outpatient Visits          3 months ago Change in bowel habits   [place], MD   5 months ago Essential hypertension   [place], [place], NP   8 months ago COVID-19 virus detected   [place], Levada Dy M, [place]   11 months ago [place], [place], MD   1 year ago DDD (degenerative disc disease), cervical   [place], MD      Future Appointments            In 3 weeks Camillia Herter, NP [place]            metFORMIN (GLUCOPHAGE) 500 MG tablet [Pharmacy Med Name: METFORMIN HCL 500 MG TABS 500 Tablet] 30 tablet 0    Sig: TAKE 1 TABLET (500 MG TOTAL) BY MOUTH DAILY WITH BREAKFAST.     Endocrinology:  Diabetes - Biguanides Passed - 12/04/2019  3:38 PM      Passed - Cr in normal range and within 360 days    Creat  Date Value Ref Range Status  11/27/2015 0.75 0.50 - 1.10 mg/dL Final   Creatinine, Ser  Date Value Ref Range Status  07/06/2019 0.67 0.57 - 1.00 mg/dL Final         Passed - HBA1C is between 0 and 7.9 and within 180 days    Hgb A1c MFr Bld  Date Value Ref Range Status  07/06/2019 5.5 4.8 - 5.6 % Final    Comment:             Prediabetes: 5.7 - 6.4          Diabetes: >6.4          Glycemic control for adults with  diabetes: <7.0          Passed - eGFR in normal range and within 360 days    GFR calc Af Amer  Date Value Ref Range Status  07/06/2019 120 >59 mL/min/1.73 Final   GFR calc non Af Amer  Date Value Ref Range Status  07/06/2019 104 >59 mL/min/1.73 Final         Passed - Valid encounter within last 6 months    Recent Outpatient Visits          3 months ago Change in bowel habits   [place], MD   5 months ago Essential hypertension   [place], [place], NP   8 months ago COVID-19 virus detected   [place], [place], [place]   11 months ago Palpitations  [place] Fulp, [place], MD   1 year ago DDD (degenerative disc disease), cervical   [place], MD      Future Appointments            In 3 weeks Camillia Herter, NP [place]            DULoxetine (CYMBALTA) 20 MG capsule [Pharmacy Med Name: DULOXETINE HCL '20MG'$  CPEP 20 Capsule] 30 capsule 0    Sig: TAKE 1 CAPSULE BY MOUTH DAILY.     Psychiatry: Antidepressants - SNRI Passed - 12/04/2019  3:38 PM      Passed - Last BP in normal range    BP Readings from Last 1 Encounters:  08/23/19 118/85         Passed - Valid encounter within last 6 months    Recent Outpatient Visits          3 months ago Change in bowel habits   [place], MD   5 months ago Essential hypertension   [place], [place], NP   8 months ago COVID-19 virus detected   [place], [place], [place]   11 months ago [place], [place], MD   1 year ago DDD (degenerative disc disease), cervical   [place], MD       Future Appointments            In 3 weeks Camillia Herter, NP [place]            gabapentin (NEURONTIN) 300 MG capsule [Pharmacy Med Name: GABAPENTIN 300 MG CAPSULE 300 Capsule] 120 capsule 0    Sig: TAKE 1 CAPSULE BY MOUTH 2 TIMES DAILY AND 2 CAPSULES AT BEDTIME.     Neurology: Anticonvulsants - gabapentin Passed - 12/04/2019  3:38 PM      Passed - Valid encounter within last 12 months    Recent Outpatient Visits          3 months ago Change in bowel habits   [place], MD   5 months ago Essential hypertension   [place], [place], NP   8 months ago COVID-19 virus detected   [place], Levada Dy M, [place]   11 months ago [place], MD   1 year ago DDD (degenerative disc disease), cervical   [place], MD      Future Appointments            In 3 weeks Camillia Herter, NP [place]            Phone call to pt.  Scheduled for 6 wk. F/u appt. That was past due; appt. Sched. For 12/27/19 @ 11:00 AM.  Courtesy refills given at this time.

## 2019-12-04 NOTE — Telephone Encounter (Signed)
Requested medication (s) are due for refill today:  Yes  Requested medication (s) are on the active medication list:  Yes  Future visit scheduled:  Yes  Last Refill: 10/30/19; 90 / no refills  Note to clinic: not delegated  Requested Prescriptions  Pending Prescriptions Disp Refills   traMADol (ULTRAM) 50 MG tablet [Pharmacy Med Name: traMADol HCL 50 MG TABS 50 Tablet] 90 tablet 0    Sig: TAKE 1 TABLET (50 MG TOTAL) BY MOUTH 3 (THREE) TIMES DAILY. AS NEEDED FOR PAIN. (30 DAY SUPPLY)      Not Delegated - Analgesics:  Opioid Agonists Failed - 12/04/2019  3:38 PM      Failed - This refill cannot be delegated      Failed - Urine Drug Screen completed in last 360 days.      Passed - Valid encounter within last 6 months    Recent Outpatient Visits           3 months ago Change in bowel habits   [place], MD   5 months ago Essential hypertension   [place], [place], NP   8 months ago COVID-19 virus detected   [place], Levada Dy M, [place]   11 months ago [place], [place], MD   1 year ago DDD (degenerative disc disease), cervical   [place], MD       Future Appointments             In 3 weeks Camillia Herter, NP [place]             Signed Prescriptions Disp Refills   metFORMIN (GLUCOPHAGE) 500 MG tablet 30 tablet 0    Sig: TAKE 1 TABLET (500 MG TOTAL) BY MOUTH DAILY WITH BREAKFAST.      Endocrinology:  Diabetes - Biguanides Passed - 12/04/2019  3:38 PM      Passed - Cr in normal range and within 360 days    Creat  Date Value Ref Range Status  11/27/2015 0.75 0.50 - 1.10 mg/dL Final   Creatinine, Ser  Date Value Ref Range Status  07/06/2019 0.67 0.57 - 1.00 mg/dL Final          Passed - HBA1C is between 0 and 7.9  and within 180 days    Hgb A1c MFr Bld  Date Value Ref Range Status  07/06/2019 5.5 4.8 - 5.6 % Final    Comment:             Prediabetes: 5.7 - 6.4          Diabetes: >6.4          Glycemic control for adults with diabetes: <7.0           Passed - eGFR in normal range and within 360 days    GFR calc Af Amer  Date Value Ref Range Status  07/06/2019 120 >59 mL/min/1.73 Final   GFR calc non Af Amer  Date Value Ref Range Status  07/06/2019 104 >59 mL/min/1.73 Final          Passed - Valid encounter within last 6 months    Recent Outpatient Visits           3 months ago Change in bowel habits   [place],  [place], MD   5 months ago Essential hypertension   [place], [place], NP   8 months ago COVID-19 virus detected   [place], Dionne [place], [place]   11 months ago [place], [place], MD   1 year ago DDD (degenerative disc disease), cervical   [place], MD       Future Appointments             In 3 weeks Camillia Herter, NP [place]              DULoxetine (CYMBALTA) 20 MG capsule 30 capsule 0    Sig: TAKE 1 CAPSULE BY MOUTH DAILY.      Psychiatry: Antidepressants - SNRI Passed - 12/04/2019  3:38 PM      Passed - Last BP in normal range    BP Readings from Last 1 Encounters:  08/23/19 118/85          Passed - Valid encounter within last 6 months    Recent Outpatient Visits           3 months ago Change in bowel habits   [place], MD   5 months ago Essential hypertension   [place], [place], NP   8 months ago COVID-19 virus detected   [place], [place], [place]   11 months ago [place], [place], MD   1 year ago DDD (degenerative disc disease), cervical   [place], MD       Future Appointments             In 3 weeks Camillia Herter, NP [place]              gabapentin (NEURONTIN) 300 MG capsule 120 capsule 0    Sig: TAKE 1 CAPSULE BY MOUTH 2 TIMES DAILY AND 2 CAPSULES AT BEDTIME.      Neurology: Anticonvulsants - gabapentin Passed - 12/04/2019  3:38 PM      Passed - Valid encounter within last 12 months    Recent Outpatient Visits           3 months ago Change in bowel habits   [place], MD   5 months ago Essential hypertension   [place], [place], NP   8 months ago COVID-19 virus detected   [place], Levada Dy M, [place]   11 months ago [place], [place], MD   1 year ago DDD (degenerative disc disease), cervical   [place], MD       Future Appointments             In 3 weeks Camillia Herter, NP [place]

## 2019-12-05 MED FILL — GABAPENTIN 300 MG CAPSULE: 300 | 30 days supply | Qty: 120 | Fill #0

## 2019-12-05 MED FILL — ?METFORMIN HCL 500MG TABL: 500 | 30 days supply | Qty: 30 | Fill #0

## 2019-12-05 MED FILL — AMLODIPINE BESYLATE 10 MG T: 10 | 30 days supply | Qty: 30 | Fill #0

## 2019-12-05 MED FILL — ?DULOXETINE HCL 20MG CPE: 20 | 30 days supply | Qty: 30 | Fill #0

## 2019-12-07 MED FILL — clonazePAM 0.5 MG TABS: 0.5 | 30 days supply | Qty: 30 | Fill #0

## 2019-12-07 MED FILL — traMADol HCL 50 MG TABS: 50 | 30 days supply | Qty: 90 | Fill #0

## 2019-12-27 ENCOUNTER — Ambulatory Visit: Payer: Self-pay | Admitting: Family

## 2020-01-02 ENCOUNTER — Ambulatory Visit: Payer: Self-pay | Admitting: Family Medicine

## 2020-01-03 ENCOUNTER — Other Ambulatory Visit: Payer: Self-pay | Admitting: Family Medicine

## 2020-01-03 DIAGNOSIS — I10 Essential (primary) hypertension: Secondary | ICD-10-CM

## 2020-01-03 DIAGNOSIS — M2550 Pain in unspecified joint: Secondary | ICD-10-CM

## 2020-01-03 DIAGNOSIS — M797 Fibromyalgia: Secondary | ICD-10-CM

## 2020-01-03 DIAGNOSIS — M542 Cervicalgia: Secondary | ICD-10-CM

## 2020-01-03 DIAGNOSIS — R7303 Prediabetes: Secondary | ICD-10-CM

## 2020-01-03 DIAGNOSIS — F32A Depression, unspecified: Secondary | ICD-10-CM

## 2020-01-03 MED FILL — ROSUVASTATIN CALCIUM 20 MG: 20 | 30 days supply | Qty: 30 | Fill #0

## 2020-01-03 MED FILL — VENLAFAXINE HCL ER 150 MG C: 150 | 30 days supply | Qty: 30 | Fill #2

## 2020-01-03 MED FILL — ?OMEPRAZOLE 20 MG CPDR: 20 | 30 days supply | Qty: 60 | Fill #1

## 2020-01-03 NOTE — Telephone Encounter (Signed)
Requested medication (s) are due for refill today: yes   Requested medication (s) are on the active medication list: yes   Last refill:  see note  Future visit scheduled: no  Notes to clinic:  patient received courtesy refill on 12/04/19. Unable to confirm if patient attended appt due to cancel by provider on 12/27/19. Please advise of further refills.     Requested Prescriptions  Pending Prescriptions Disp Refills   metFORMIN (GLUCOPHAGE) 500 MG tablet [Pharmacy Med Name: METFORMIN HCL $RemoveBefor'500MG'[id number]$  TABL 500 Tablet] 30 tablet 0    Sig: TAKE 1 TABLET (500 MG TOTAL) BY MOUTH DAILY WITH BREAKFAST.      Endocrinology:  Diabetes - Biguanides Failed - 01/03/2020  1:05 PM      Failed - HBA1C is between 0 and 7.9 and within 180 days    Hgb A1c MFr Bld  Date Value Ref Range Status  07/06/2019 5.5 4.8 - 5.6 % Final    Comment:             Prediabetes: 5.7 - 6.4          Diabetes: >6.4          Glycemic control for adults with diabetes: <7.0           Passed - Cr in normal range and within 360 days    Creat  Date Value Ref Range Status  11/27/2015 0.75 0.50 - 1.10 mg/dL Final   Creatinine, Ser  Date Value Ref Range Status  07/06/2019 0.67 0.57 - 1.00 mg/dL Final          Passed - eGFR in normal range and within 360 days    GFR calc Af Amer  Date Value Ref Range Status  07/06/2019 120 >59 mL/min/1.73 Final   GFR calc non Af Amer  Date Value Ref Range Status  07/06/2019 104 >59 mL/min/1.73 Final          Passed - Valid encounter within last 6 months    Recent Outpatient Visits           4 months ago Change in bowel habits   [place], MD   6 months ago Essential hypertension   [place], [place], NP   9 months ago COVID-19 virus detected   [place], [place], [place]   1 year ago Palpitations   [place], [place], MD   1  year ago DDD (degenerative disc disease), cervical   [place], [place], MD                diclofenac (VOLTAREN) 50 MG EC tablet [Pharmacy Med Name: DICLOFENAC SOD EC 50 MG TAB 50 Tablet] 60 tablet 3    Sig: Take 1 tablet (50 mg total) by mouth 2 (two) times daily. After a meal as needed for pain      Analgesics:  NSAIDS Passed - 01/03/2020  1:05 PM      Passed - Cr in normal range and within 360 days    Creat  Date Value Ref Range Status  11/27/2015 0.75 0.50 - 1.10 mg/dL Final   Creatinine, Ser  Date Value Ref Range Status  07/06/2019 0.67 0.57 - 1.00 mg/dL Final          Passed - HGB in normal range and within 360 days    Hemoglobin  Date Value  Ref Range Status  08/24/2019 13.9 11.1 - 15.9 g/dL Final          Passed - Patient is not pregnant      Passed - Valid encounter within last 12 months    Recent Outpatient Visits           4 months ago Change in bowel habits   [place], MD   6 months ago Essential hypertension   [place], [place], NP   9 months ago COVID-19 virus detected   [place], [place], [place]   1 year ago [place], [place], MD   1 year ago DDD (degenerative disc disease), cervical   [place], MD                amLODipine (NORVASC) 10 MG tablet [Pharmacy Med Name: AMLODIPINE BESYLATE 10 MG T 10 Tablet] 30 tablet 0    Sig: TAKE 1 TABLET (10 MG TOTAL) BY MOUTH DAILY.      Cardiovascular:  Calcium Channel Blockers Passed - 01/03/2020  1:05 PM      Passed - Last BP in normal range    BP Readings from Last 1 Encounters:  08/23/19 118/85          Passed - Valid encounter within last 6 months    Recent Outpatient Visits           4 months ago Change in bowel habits   [place], MD   6 months ago Essential hypertension   [place], [place], NP   9 months ago COVID-19 virus detected   [place], [place], [place]   1 year ago [place], [place], MD   1 year ago DDD (degenerative disc disease), cervical   [place], [place], MD                gabapentin (NEURONTIN) 300 MG capsule [Pharmacy Med Name: GABAPENTIN 300 MG CAPSULE 300 Capsule] 120 capsule 0    Sig: TAKE 1 CAPSULE BY MOUTH 2 TIMES DAILY AND 2 CAPSULES AT BEDTIME.      Neurology: Anticonvulsants - gabapentin Passed - 01/03/2020  1:05 PM      Passed - Valid encounter within last 12 months    Recent Outpatient Visits           4 months ago Change in bowel habits   [place], MD   6 months ago Essential hypertension   [place], [place], NP   9 months ago COVID-19 virus detected   [place], [place], [place]   1 year ago [place], [place], MD   1 year ago DDD (degenerative disc disease), cervical   [place], MD                DULoxetine (CYMBALTA) 20 MG capsule [Pharmacy Med Name: DULOXETINE HCL $RemoveBefore'20MG'[id number]$  CPE 20 Capsule] 30 capsule 0    Sig: TAKE 1 CAPSULE BY MOUTH DAILY.  Psychiatry: Antidepressants - SNRI Passed - 01/03/2020  1:05 PM      Passed - Last BP in normal range    BP Readings from Last 1 Encounters:  08/23/19 118/85          Passed - Valid encounter within last 6 months    Recent Outpatient Visits           4 months ago Change in bowel habits   [place], MD   6 months ago Essential hypertension   [place], [place], NP   9 months ago COVID-19 virus detected   [place], [place], [place]   1 year ago Palpitations   [facility] [place], [place], MD   1 year ago DDD (degenerative disc disease), cervical   [facility] [place], [place], MD

## 2020-01-04 ENCOUNTER — Other Ambulatory Visit: Payer: Self-pay

## 2020-01-04 ENCOUNTER — Encounter: Payer: Self-pay | Admitting: Family Medicine

## 2020-01-04 ENCOUNTER — Other Ambulatory Visit: Payer: Self-pay | Admitting: Family Medicine

## 2020-01-04 ENCOUNTER — Ambulatory Visit: Payer: Self-pay | Attending: Family | Admitting: Family Medicine

## 2020-01-04 VITALS — BP 100/66 | HR 82 | Ht 71.0 in | Wt 163.0 lb

## 2020-01-04 DIAGNOSIS — M545 Low back pain, unspecified: Secondary | ICD-10-CM

## 2020-01-04 DIAGNOSIS — M25552 Pain in left hip: Secondary | ICD-10-CM

## 2020-01-04 DIAGNOSIS — M720 Palmar fascial fibromatosis [Dupuytren]: Secondary | ICD-10-CM

## 2020-01-04 DIAGNOSIS — R634 Abnormal weight loss: Secondary | ICD-10-CM

## 2020-01-04 DIAGNOSIS — M797 Fibromyalgia: Secondary | ICD-10-CM

## 2020-01-04 DIAGNOSIS — Q6589 Other specified congenital deformities of hip: Secondary | ICD-10-CM

## 2020-01-04 DIAGNOSIS — R7303 Prediabetes: Secondary | ICD-10-CM

## 2020-01-04 DIAGNOSIS — G8929 Other chronic pain: Secondary | ICD-10-CM

## 2020-01-04 DIAGNOSIS — M65331 Trigger finger, right middle finger: Secondary | ICD-10-CM

## 2020-01-04 DIAGNOSIS — Z23 Encounter for immunization: Secondary | ICD-10-CM

## 2020-01-04 MED ORDER — GABAPENTIN 300 MG PO CAPS: ORAL_CAPSULE | ORAL | 5 refills | Status: DC

## 2020-01-04 MED ORDER — TIZANIDINE HCL 4 MG PO TABS: 4.0000 mg | ORAL_TABLET | Freq: Three times a day (TID) | ORAL | 5 refills | Status: DC | PRN

## 2020-01-04 MED ORDER — TRAMADOL HCL 50 MG PO TABS: ORAL_TABLET | ORAL | 5 refills | Status: DC

## 2020-01-04 MED FILL — DICLOFENAC SOD EC 50 MG TAB: 50 | 30 days supply | Qty: 60 | Fill #0

## 2020-01-04 MED FILL — AMLODIPINE BESYLATE 10 MG T: 10 | 30 days supply | Qty: 30 | Fill #0

## 2020-01-04 MED FILL — tiZANidine HCL 4 MG TABS: 4 | 30 days supply | Qty: 90 | Fill #0

## 2020-01-04 MED FILL — ?DULOXETINE HCL 20MG CPE: 20 | 30 days supply | Qty: 30 | Fill #0

## 2020-01-04 MED FILL — ?METFORMIN HCL 500MG TABL: 500 | 30 days supply | Qty: 30 | Fill #0

## 2020-01-04 NOTE — Progress Notes (Signed)
Having pain in neck,back,shoulders and hips.

## 2020-01-04 NOTE — Progress Notes (Signed)
Established Patient Office Visit  Subjective:  Patient ID: Isabel Evans, female    DOB: 12/29/1970  Age: 49 y.o. MRN: [id number]  CC:  Chief Complaint  Patient presents with  . Pain   Isabel Evans, 49 year old female with hypertension, prediabetes, fibromyalgia, generalized anxiety disorder, chronic pain due to cervical and lumbar degenerative disc disease, left hip dysplasia, Dupuytren's contractures of the hands and feet, who was last seen in the office on 07/06/2019 and at that time had complaint as well of change in bowel habits and abnormal weight loss.  She reports that she was contacted by gastroenterology regarding her visit to evaluate her abnormal weight loss and change in bowel habits however she reports that she had to reschedule the appointment.  She is not really aware if she has lost any additional weight recently.  She does not think that she eats as much as she did in the past.  She is not having any symptoms associated with  diabetes.  Her last hemoglobin A1c was normal at 5.5 in March.          She did see hand specialist since her last visit.  She reports that he told her that surgery would likely would not help with her Dupuytren's contractures.  She has recently started having issues with her right middle finger.  Especially in the mornings, she has difficulty straightening her right middle finger and sometimes has to use her left hand to lift the right middle finger and her right middle finger makes a popping noise as she is trying to straighten it.  She also continues to have chronic issues with neck and back pain.  She would like to have a refill of pain medication, tramadol as well as a refill of a muscle relaxant.  She feels that Flexeril is no longer effective.  She also needs a refill of gabapentin.  She also continues to have chronic, dull, aching pain in her left hip.  She has not followed up with orthopedics recently.  Past Medical History:  Diagnosis Date    . Anemia 11/24/2017  . Anxiety   . Bone spur    cervical spine  . Chronic kidney disease 04/2017   kidney infections  . Depression   . Deviated nasal septum    states is unable to lie flat, because her nose will become congested and she can stop breathing  . Diabetes mellitus without complication ([facility])   . Dysrhythmia    heart flutters occasionally  . Fibromyalgia   . Fibromyalgia   . GERD (gastroesophageal reflux disease)   . Hallux abductovalgus with bunions 09/2014   right   . Hammertoe 09/2014   right 4th, 5th  . History of stomach ulcers   . Hyperlipidemia   . Hypertension    states is under control with med., has been on med. x 8 mos.  . IBS (irritable bowel syndrome)    no current med.  . Osteoarthritis    hands, bilateral hips  . Peripheral vascular disease ([facility])    occasional swelling in both legs  . Sinus headache     Past Surgical History:  Procedure Laterality Date  . ABDOMINAL HYSTERECTOMY    . BREAST BIOPSY Left 04/2018   benign  . BREAST BIOPSY Left 2016   benign  . BUNIONECTOMY Right 10/04/2014   Procedure:  Isabel Evans;  Surgeon: Isabel Evans;  Location: [facility];  Service: Podiatry;  Laterality: Right;  .  COLONOSCOPY WITH PROPOFOL  02/13/2013  . CYSTOSCOPY N/A 07/07/2017   Procedure: CYSTOSCOPY;  Surgeon: Isabel Halim, MD;  Location: [facility];  Service: Gynecology;  Laterality: N/A;  . DUPUYTREN / PALMAR FASCIOTOMY Right 07/18/2013   exc. of multiple nodules  . 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 HAMMER TOE SURGERY Right 10/04/2014   Procedure: HAMMER TOE REPAIR 4TH  AND 5TH  RIGHT FOOT  fourth toe fixation with k wire;  Surgeon: Isabel Evans;  Location: [facility];  Service: Podiatry;  Laterality: Right;  . 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 TUBAL LIGATION    . VAGINAL HYSTERECTOMY N/A 07/07/2017   Procedure:  HYSTERECTOMY VAGINAL;  Surgeon: Isabel Halim, MD;  Location: [facility];  Service: Gynecology;  Laterality: N/A;    Family History  Problem Relation Age of Onset  . Hypertension Mother   . Diabetes Mother   . Heart disease Mother   . Hypertension Father   . Diabetes Father   . Prostate cancer Father   . Heart disease Father   . Alcohol abuse Father   . Hypertension Sister   . Diabetes Sister   . Heart disease Maternal Grandmother        Great GM  . Breast cancer Maternal Grandmother   . Bone cancer Maternal Grandmother   . Breast cancer Maternal Aunt   . Colon cancer Maternal Aunt   . Ovarian cancer Maternal Aunt   . Rheum arthritis Sister     Social History   Socioeconomic History  . Marital status: Single    Spouse name: Not on file  . Number of children: 3  . Years of education: Not on file  . Highest education level: 7th grade  Occupational History  . Occupation: cook  Tobacco Use  . Smoking status: Former Smoker    Packs/day: 1.00    Years: 20.00    Pack years: 20.00    Types: Cigarettes    Quit date: 08/24/2012    Years since quitting: 7.3  . Smokeless tobacco: Never Used  Vaping Use  . Vaping Use: Some days  Substance and Sexual Activity  . Alcohol use: Yes    Comment: occasionally  . Drug use: No  . Sexual activity: Yes    Birth control/protection: Surgical  Other Topics Concern  . Not on file  Social History Narrative  . Not on file   Social Determinants of Health   Financial Resource Strain:   . Difficulty of Paying Living Expenses: Not on file  Food Insecurity:   . Worried About Charity fundraiser in the Last Year: Not on file  . Ran Out of Food in the Last Year: Not on file  Transportation Needs: No Transportation Needs  . Lack of Transportation (Medical): No  . Lack of Transportation (Non-Medical): No  Physical Activity:   . Days of Exercise per Week: Not on file  . Minutes of Exercise per Session: Not on file  Stress:   . Feeling of  Stress : Not on file  Social Connections:   . Frequency of Communication with Friends and Family: Not on file  . Frequency of Social Gatherings with Friends and Family: Not on file  . Attends Religious Services: Not on file  . Active Member of Clubs or Organizations: Not on file  . Attends Archivist Meetings: Not on file  . Marital Status: Not on  file  Intimate Partner Violence:   . Fear of Current or Ex-Partner: Not on file  . Emotionally Abused: Not on file  . Physically Abused: Not on file  . Sexually Abused: Not on file    Outpatient Medications Prior to Visit  Medication Sig Dispense Refill  . amLODipine (NORVASC) 10 MG tablet TAKE 1 TABLET (10 MG TOTAL) BY MOUTH DAILY. 30 tablet 0  . clonazePAM (KLONOPIN) 0.5 MG tablet TAKE 1/2 TABLET BY MOUTH TWICE DAILY AS NEEDED FOR ANXIETY 30 tablet 2  . cyclobenzaprine (FLEXERIL) 10 MG tablet TAKE ONE TABLET BY MOUTH TWICE DAILY AS NEEDED FOR MUSCLE SPASM 60 tablet 1  . diclofenac (VOLTAREN) 50 MG EC tablet TAKE 1 TABLET (50 MG TOTAL) BY MOUTH 2 (TWO) TIMES DAILY. AFTER A MEAL AS NEEDED FOR PAIN 60 tablet 0  . dicyclomine (BENTYL) 10 MG capsule TAKE 1 CAPSULE BY MOUTH 4 TIMES DAILY AS NEEDED FOR SPASMS. 60 capsule 2  . diphenhydrAMINE (BENADRYL) 25 MG tablet Take 25 mg by mouth daily as needed for allergies.    . DULoxetine (CYMBALTA) 20 MG capsule TAKE 1 CAPSULE BY MOUTH DAILY. 30 capsule 0  . fluticasone (FLONASE) 50 MCG/ACT nasal spray PLACE 2 SPRAYS INTO BOTH NOSTRILS DAILY. 16 g 11  . gabapentin (NEURONTIN) 300 MG capsule TAKE 1 CAPSULE BY MOUTH 2 TIMES DAILY AND 2 CAPSULES AT BEDTIME. 120 capsule 0  . metFORMIN (GLUCOPHAGE) 500 MG tablet TAKE 1 TABLET (500 MG TOTAL) BY MOUTH DAILY WITH BREAKFAST. 30 tablet 0  . omeprazole (PRILOSEC) 20 MG capsule Take 2 capsules (40 mg total) by mouth daily. 60 capsule 2  . ondansetron (ZOFRAN-ODT) 8 MG disintegrating tablet Take 1 tablet (8 mg total) by mouth daily as needed for nausea or  vomiting. 20 tablet 2  . rosuvastatin (CRESTOR) 20 MG tablet Take 1 tablet (20 mg total) by mouth daily. To lower cholesterol 30 tablet 2  . traMADol (ULTRAM) 50 MG tablet TAKE 1 TABLET (50 MG TOTAL) BY MOUTH 3 (THREE) TIMES DAILY. AS NEEDED FOR PAIN. (30 DAY SUPPLY) 90 tablet 0  . venlafaxine XR (EFFEXOR-XR) 150 MG 24 hr capsule TAKE 1 CAPSULE BY MOUTH DAILY WITH BREAKFAST. 30 capsule 3   No facility-administered medications prior to visit.    No Known Allergies  ROS Review of Systems  Constitutional: Positive for fatigue. Negative for chills and fever.  HENT: Negative for sore throat and trouble swallowing.   Eyes: Negative for photophobia and visual disturbance.  Respiratory: Negative for cough and shortness of breath.   Cardiovascular: Negative for chest pain, palpitations and leg swelling.  Gastrointestinal: Negative for abdominal pain, blood in stool, constipation, diarrhea and nausea.  Endocrine: Negative for polydipsia, polyphagia and polyuria.  Genitourinary: Negative for dysuria and frequency.  Musculoskeletal: Positive for arthralgias, back pain, gait problem, myalgias and neck pain.  Skin: Negative for rash and wound.  Neurological: Positive for weakness (sometimes in arms/hands) and headaches (occasional). Negative for dizziness.  Hematological: Negative for adenopathy. Does not bruise/bleed easily.  Psychiatric/Behavioral: Negative for suicidal ideas. The patient is nervous/anxious.       Objective:    Physical Exam Vitals and nursing note reviewed.  Constitutional:      General: She is not in acute distress.    Appearance: Normal appearance.  Neck:     Comments: Mild discomfort to palpation over mid to lower cervical area as well as cervical paraspinous/trapezius area spasm. Cardiovascular:     Rate and Rhythm: Normal rate and regular  rhythm.  Pulmonary:     Effort: Pulmonary effort is normal.     Breath sounds: Normal breath sounds.  Abdominal:      Tenderness: There is no abdominal tenderness. There is no right CVA tenderness, left CVA tenderness, guarding or rebound.  Musculoskeletal:        General: Tenderness and deformity (Contracture of the palm of the left hand) present.     Cervical back: Neck supple. Tenderness present.     Comments: Cervical and lower lumbar/sacral discomfort to palpation.  Patient with triggering of the right middle finger  Lymphadenopathy:     Cervical: No cervical adenopathy.  Skin:    General: Skin is warm and dry.  Neurological:     Mental Status: She is alert and oriented to person, place, and time.     Cranial Nerves: No cranial nerve deficit.  Psychiatric:        Mood and Affect: Mood normal.        Behavior: Behavior normal.     Comments: Slightly flattened affect but patient interacts appropriately.  Flattened affect may also be secondary to fatigue/pain     BP 100/66   Pulse 82   Ht 5\' 11"  (1.803 m)   Wt 163 lb (73.9 kg)   LMP 07/04/2017   SpO2 99%   BMI 22.73 kg/m  Wt Readings from Last 3 Encounters:  01/04/20 163 lb (73.9 kg)  08/23/19 167 lb 12.8 oz (76.1 kg)  08/07/19 168 lb (76.2 kg)     Health Maintenance Due  Topic Date Due  . Hepatitis C Screening  Never done  . COVID-19 Vaccine (1) Never done  . INFLUENZA VACCINE  11/18/2019     Lab Results  Component Value Date   TSH 1.010 08/24/2019   Lab Results  Component Value Date   WBC 5.7 08/24/2019   HGB 13.9 08/24/2019   HCT 41.0 08/24/2019   MCV 90 08/24/2019   PLT 247 08/24/2019   Lab Results  Component Value Date   NA 140 07/06/2019   K 4.3 07/06/2019   CO2 28 07/06/2019   GLUCOSE 95 07/06/2019   BUN 8 07/06/2019   CREATININE 0.67 07/06/2019   BILITOT <0.2 07/06/2019   ALKPHOS 72 07/06/2019   AST 19 07/06/2019   ALT 12 07/06/2019   PROT 7.1 07/06/2019   ALBUMIN 4.8 07/06/2019   CALCIUM 9.4 07/06/2019   ANIONGAP 11 06/27/2017   Lab Results  Component Value Date   CHOL 147 08/24/2019   Lab Results   Component Value Date   HDL 69 08/24/2019   Lab Results  Component Value Date   LDLCALC 62 08/24/2019   Lab Results  Component Value Date   TRIG 87 08/24/2019   Lab Results  Component Value Date   CHOLHDL 2.1 08/24/2019   Lab Results  Component Value Date   HGBA1C 5.5 07/06/2019      Assessment & Plan:  1. Abnormal weight loss Will place new referral to gastroenterology regarding patient's weight loss and will also check thyroid panel to look for hyperthyroidism, CBC to look for blood abnormality and comprehensive metabolic panel to look for elevated blood sugars/electrolyte abnormality or liver abnormality that may be contributing to her weight loss. - Ambulatory referral to Gastroenterology - Thyroid Panel With TSH - CBC with Differential - Comprehensive metabolic panel  2. Pre-diabetes Her last hemoglobin A1c was normal at 5.5.  This may be secondary to her weight loss and decreased food intake. - Hemoglobin A1c  3. Low back pain with radiation Referral will be placed to orthopedics in follow-up of patient's chronic low back pain with radiation as well as left hip dysplasia.  New prescriptions provided for gabapentin and tramadol to take as needed for pain.  As patient feels that Flexeril is no longer effective, new prescription provided for tizanidine and patient is made aware that the medication can cause drowsiness and she may initially want to start with 2 mg dose. - Ambulatory referral to Orthopedic Surgery - gabapentin (NEURONTIN) 300 MG capsule; One pill three times per day as needed for nerve pain  Dispense: 120 capsule; Refill: 5 - tiZANidine (ZANAFLEX) 4 MG tablet; Take 1 tablet (4 mg total) by mouth every 8 (eight) hours as needed for muscle spasms. Start with 1/2 pill (2 mg) as patient can cause drowsiness  Dispense: 90 tablet; Refill: 5  4. Trigger middle finger of right hand Referral to hand surgery and follow-up of triggering of the right middle finger. -  Ambulatory referral to Hand Surgery  5. Hip dysplasia Orthopedic referral and follow-up of left hip dysplasia with chronic left hip pain. - Ambulatory referral to Orthopedic Surgery  6. Fibromyalgia Refill of gabapentin and prescription for tizanidine - gabapentin (NEURONTIN) 300 MG capsule; One pill three times per day as needed for nerve pain  Dispense: 120 capsule; Refill: 5  7. Chronic left hip pain 8. Dupuytren's contracture of both hands 9. Chronic midline low back pain without sciatica Prescription provided for tramadol to help with patient's chronic pain from cervical and lumbar degenerative disc disease, left hip dysplasia and Dupuytren's contractures - traMADol (ULTRAM) 50 MG tablet; TAKE 1 TABLET (50 MG TOTAL) BY MOUTH 3 (THREE) TIMES DAILY. AS NEEDED FOR PAIN. (30 DAY SUPPLY)  Dispense: 90 tablet; Refill: 5  10. Need for immunization against influenza Influenza immunization offered at today's visit.  Patient received influenza immunization as well as educational material regarding the immunization. - Flu Vaccine QUAD 36+ mos IM    Follow-up: Return in about 7 weeks (around 02/22/2020) for chronic issues.    Antony Blackbird, MD

## 2020-01-05 LAB — THYROID PANEL WITH TSH
Free Thyroxine Index: 1.7 (ref 1.2–4.9)
T3 Uptake Ratio: 24 % (ref 24–39)
T4, Total: 7.2 ug/dL (ref 4.5–12.0)
TSH: 0.606 u[IU]/mL (ref 0.450–4.500)

## 2020-01-05 LAB — COMPREHENSIVE METABOLIC PANEL WITH GFR
ALT: 17 IU/L (ref 0–32)
AST: 15 IU/L (ref 0–40)
Albumin/Globulin Ratio: 1.9 (ref 1.2–2.2)
Albumin: 4.5 g/dL (ref 3.8–4.8)
Alkaline Phosphatase: 63 IU/L (ref 44–121)
BUN/Creatinine Ratio: 11 (ref 9–23)
BUN: 8 mg/dL (ref 6–24)
Bilirubin Total: 0.3 mg/dL (ref 0.0–1.2)
CO2: 26 mmol/L (ref 20–29)
Calcium: 9.5 mg/dL (ref 8.7–10.2)
Chloride: 101 mmol/L (ref 96–106)
Creatinine, Ser: 0.7 mg/dL (ref 0.57–1.00)
GFR calc Af Amer: 118 mL/min/1.73
GFR calc non Af Amer: 103 mL/min/1.73
Globulin, Total: 2.4 g/dL (ref 1.5–4.5)
Glucose: 94 mg/dL (ref 65–99)
Potassium: 4.5 mmol/L (ref 3.5–5.2)
Sodium: 140 mmol/L (ref 134–144)
Total Protein: 6.9 g/dL (ref 6.0–8.5)

## 2020-01-05 LAB — HEMOGLOBIN A1C
Est. average glucose Bld gHb Est-mCnc: 105 mg/dL
Hgb A1c MFr Bld: 5.3 % (ref 4.8–5.6)

## 2020-01-05 LAB — CBC WITH DIFFERENTIAL/PLATELET
Basophils Absolute: 0.1 x10E3/uL (ref 0.0–0.2)
Basos: 1 %
EOS (ABSOLUTE): 0.1 x10E3/uL (ref 0.0–0.4)
Eos: 2 %
Hematocrit: 39.4 % (ref 34.0–46.6)
Hemoglobin: 13.2 g/dL (ref 11.1–15.9)
Immature Grans (Abs): 0 x10E3/uL (ref 0.0–0.1)
Immature Granulocytes: 0 %
Lymphocytes Absolute: 1.7 x10E3/uL (ref 0.7–3.1)
Lymphs: 27 %
MCH: 30.9 pg (ref 26.6–33.0)
MCHC: 33.5 g/dL (ref 31.5–35.7)
MCV: 92 fL (ref 79–97)
Monocytes Absolute: 0.4 x10E3/uL (ref 0.1–0.9)
Monocytes: 7 %
Neutrophils Absolute: 3.9 x10E3/uL (ref 1.4–7.0)
Neutrophils: 63 %
Platelets: 280 x10E3/uL (ref 150–450)
RBC: 4.27 x10E6/uL (ref 3.77–5.28)
RDW: 13.2 % (ref 11.7–15.4)
WBC: 6.1 x10E3/uL (ref 3.4–10.8)

## 2020-01-07 ENCOUNTER — Other Ambulatory Visit: Payer: Self-pay | Admitting: Family

## 2020-01-07 DIAGNOSIS — R110 Nausea: Secondary | ICD-10-CM

## 2020-01-07 MED FILL — traMADol HCL 50 MG TABS: 50 | 30 days supply | Qty: 90 | Fill #0

## 2020-01-07 MED FILL — clonazePAM 0.5 MG TABS: 0.5 | 30 days supply | Qty: 30 | Fill #1

## 2020-01-08 ENCOUNTER — Other Ambulatory Visit: Payer: Self-pay | Admitting: Family Medicine

## 2020-01-09 ENCOUNTER — Other Ambulatory Visit: Payer: Self-pay

## 2020-01-09 ENCOUNTER — Ambulatory Visit: Payer: Self-pay | Attending: Family Medicine

## 2020-01-16 MED FILL — FLUTICASONE PROP 50 MCG SPR: 50 | 20 days supply | Qty: 16 | Fill #1

## 2020-01-16 MED FILL — ONDANSETRON ODT 8 MG TABLET: 8 | 20 days supply | Qty: 20 | Fill #0

## 2020-01-16 MED FILL — GABAPENTIN 300 MG CAPSULE: 300 | 30 days supply | Qty: 120 | Fill #0

## 2020-01-24 NOTE — Progress Notes (Deleted)
Cardiology Office Note:   Date:  01/24/2020  NAME:  Isabel Evans    MRN: [id number] DOB:  1970/12/03   PCP:  Antony Blackbird, MD  Cardiologist:  Evalina Field, MD  Electrophysiologist:  None   Referring MD: Antony Blackbird, MD   No chief complaint on file. ***  History of Present Illness:   Isabel Evans is a 49 y.o. female with a hx of anxiety, HTN, HLD who presents for follow-up of palpitations/PVCs.   Problem List 1. PVCs -3% burden -EF 55-60% 2. HTN 3. T chol 147, HDL 69, LDL 62, TG 87  Past Medical History: Past Medical History:  Diagnosis Date  . Anemia 11/24/2017  . Anxiety   . Bone spur    cervical spine  . Chronic kidney disease 04/2017   kidney infections  . Depression   . Deviated nasal septum    states is unable to lie flat, because her nose will become congested and she can stop breathing  . Diabetes mellitus without complication ([place])   . Dysrhythmia    heart flutters occasionally  . Fibromyalgia   . Fibromyalgia   . GERD (gastroesophageal reflux disease)   . Hallux abductovalgus with bunions 09/2014   right   . Hammertoe 09/2014   right 4th, 5th  . History of stomach ulcers   . Hyperlipidemia   . Hypertension    states is under control with med., has been on med. x 8 mos.  . IBS (irritable bowel syndrome)    no current med.  . Osteoarthritis    hands, bilateral hips  . Peripheral vascular disease (HCC)    occasional swelling in both legs  . Sinus headache     Past Surgical History: Past Surgical History:  Procedure Laterality Date  . ABDOMINAL HYSTERECTOMY    . BREAST BIOPSY Left 04/2018   benign  . BREAST BIOPSY Left 2016   benign  . BUNIONECTOMY Right 10/04/2014   Procedure:  Altamese Hammond, Emmaline Life;  Surgeon: Trula Slade, DPM;  Location: [facility];  Service: Podiatry;  Laterality: Right;  . COLONOSCOPY WITH PROPOFOL  02/13/2013  . CYSTOSCOPY N/A 07/07/2017   Procedure: CYSTOSCOPY;   Surgeon: Aletha Halim, MD;  Location: [facility];  Service: Gynecology;  Laterality: N/A;  . DUPUYTREN / PALMAR FASCIOTOMY Right 07/18/2013   exc. of multiple nodules  . 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 HAMMER TOE SURGERY Right 10/04/2014   Procedure: HAMMER TOE REPAIR 4TH  AND 5TH  RIGHT FOOT  fourth toe fixation with k wire;  Surgeon: Trula Slade, DPM;  Location: [facility];  Service: Podiatry;  Laterality: Right;  . 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 TUBAL LIGATION    . VAGINAL HYSTERECTOMY N/A 07/07/2017   Procedure: HYSTERECTOMY VAGINAL;  Surgeon: Aletha Halim, MD;  Location: [facility];  Service: Gynecology;  Laterality: N/A;    Current Medications: No outpatient medications have been marked as taking for the 01/25/20 encounter (Appointment) with O'Neal, Cassie Freer, MD.     Allergies:    Patient has no known allergies.   Social History: Social History   Socioeconomic History  . Marital status: Single    Spouse name: Not on file  . Number of children: 3  . Years of education: Not on file  . Highest education level: 7th grade  Occupational History  . Occupation: cook  Tobacco Use  .  Smoking status: Former Smoker    Packs/day: 1.00    Years: 20.00    Pack years: 20.00    Types: Cigarettes    Quit date: 08/24/2012    Years since quitting: 7.4  . Smokeless tobacco: Never Used  Vaping Use  . Vaping Use: Some days  Substance and Sexual Activity  . Alcohol use: Yes    Comment: occasionally  . Drug use: No  . Sexual activity: Yes    Birth control/protection: Surgical  Other Topics Concern  . Not on file  Social History Narrative  . Not on file   Social Determinants of Health   Financial Resource Strain:   . Difficulty of Paying Living Expenses: Not on file  Food Insecurity:   . Worried About Charity fundraiser in the Last Year: Not on file  . Ran Out of Food in the Last Year: Not on file    Transportation Needs: No Transportation Needs  . Lack of Transportation (Medical): No  . Lack of Transportation (Non-Medical): No  Physical Activity:   . Days of Exercise per Week: Not on file  . Minutes of Exercise per Session: Not on file  Stress:   . Feeling of Stress : Not on file  Social Connections:   . Frequency of Communication with Friends and Family: Not on file  . Frequency of Social Gatherings with Friends and Family: Not on file  . Attends Religious Services: Not on file  . Active Member of Clubs or Organizations: Not on file  . Attends Archivist Meetings: Not on file  . Marital Status: Not on file     Family History: The patient's ***family history includes Alcohol abuse in her father; Bone cancer in her maternal grandmother; Breast cancer in her maternal aunt and maternal grandmother; Colon cancer in her maternal aunt; Diabetes in her father, mother, and sister; Heart disease in her father, maternal grandmother, and mother; Hypertension in her father, mother, and sister; Ovarian cancer in her maternal aunt; Prostate cancer in her father; Rheum arthritis in her sister.  ROS:   All other ROS reviewed and negative. Pertinent positives noted in the HPI.     EKGs/Labs/Other Studies Reviewed:   The following studies were personally reviewed by me today:  EKG:  EKG is *** ordered today.  The ekg ordered today demonstrates ***, and was personally reviewed by me.   TTE 02/02/2019 1. Left ventricular ejection fraction, by visual estimation, is 55 to  60%. The left ventricle has normal function. Normal left ventricular size.  There is no left ventricular hypertrophy.  2. Global right ventricle has normal systolic function.The right  ventricular size is normal. No increase in right ventricular wall  thickness.  3. Left atrial size was normal.  4. Right atrial size was normal.  5. The mitral valve is normal in structure. Trace mitral valve  regurgitation. No  evidence of mitral stenosis.  6. The tricuspid valve is normal in structure. Tricuspid valve  regurgitation is trivial.  7. The aortic valve is tricuspid Aortic valve regurgitation is trivial by  color flow Doppler. Mild aortic valve sclerosis without stenosis.  8. The pulmonic valve was normal in structure. Pulmonic valve  regurgitation is trivial by color flow Doppler.  9. TR signal is inadequate for assessing pulmonary artery systolic  pressure.  10. The inferior vena cava is normal in size with greater than 50%  respiratory variability, suggesting right atrial pressure of 3 mmHg.  Recent Labs: 01/04/2020: ALT  17; BUN 8; Creatinine, Ser 0.70; Hemoglobin 13.2; Platelets 280; Potassium 4.5; Sodium 140; TSH 0.606   Recent Lipid Panel    Component Value Date/Time   CHOL 147 08/24/2019 0847   TRIG 87 08/24/2019 0847   HDL 69 08/24/2019 0847   CHOLHDL 2.1 08/24/2019 0847   LDLCALC 62 08/24/2019 0847    Physical Exam:   VS:  LMP 07/04/2017    Wt Readings from Last 3 Encounters:  01/04/20 163 lb (73.9 kg)  08/23/19 167 lb 12.8 oz (76.1 kg)  08/07/19 168 lb (76.2 kg)    General: Well nourished, well developed, in no acute distress Heart: Atraumatic, normal size  Eyes: PEERLA, EOMI  Neck: Supple, no JVD Endocrine: No thryomegaly Cardiac: Normal S1, S2; RRR; no murmurs, rubs, or gallops Lungs: Clear to auscultation bilaterally, no wheezing, rhonchi or rales  Abd: Soft, nontender, no hepatomegaly  Ext: No edema, pulses 2+ Musculoskeletal: No deformities, BUE and BLE strength normal and equal Skin: Warm and dry, no rashes   Neuro: Alert and oriented to person, place, time, and situation, CNII-XII grossly intact, no focal deficits  Psych: Normal mood and affect   ASSESSMENT:   Isabel Evans is a 49 y.o. female who presents for the following: No diagnosis found.  PLAN:   There are no diagnoses linked to this encounter.  Disposition: No follow-ups on  file.  Medication Adjustments/Labs and Tests Ordered: Current medicines are reviewed at length with the patient today.  Concerns regarding medicines are outlined above.  No orders of the defined types were placed in this encounter.  No orders of the defined types were placed in this encounter.   There are no Patient Instructions on file for this visit.   Time Spent with Patient: I have spent a total of *** minutes with patient reviewing hospital notes, telemetry, EKGs, labs and examining the patient as well as establishing an assessment and plan that was discussed with the patient.  > 50% of time was spent in direct patient care.  Signed, Addison Naegeli. Audie Box, [place]  [address] [place], [place] [address] [phone]  01/24/2020 6:28 AM

## 2020-01-25 ENCOUNTER — Ambulatory Visit: Payer: Self-pay | Admitting: Cardiovascular Disease

## 2020-01-29 ENCOUNTER — Other Ambulatory Visit: Payer: Self-pay | Admitting: Family Medicine

## 2020-01-29 DIAGNOSIS — F32A Depression, unspecified: Secondary | ICD-10-CM

## 2020-01-29 DIAGNOSIS — M797 Fibromyalgia: Secondary | ICD-10-CM

## 2020-01-29 MED FILL — ONDANSETRON ODT 8 MG TABLET: 8 | 20 days supply | Qty: 20 | Fill #0

## 2020-01-29 MED FILL — FLUTICASONE PROP 50 MCG SPR: 50 | 20 days supply | Qty: 16 | Fill #1

## 2020-01-29 MED FILL — DICYCLOMINE 10 MG CAPSULE: 10 | 15 days supply | Qty: 60 | Fill #1

## 2020-01-29 MED FILL — GABAPENTIN 300 MG CAPSULE: 300 | 30 days supply | Qty: 120 | Fill #0

## 2020-01-29 MED FILL — ?DULOXETINE HCL 20MG CPE: 20 | 30 days supply | Qty: 30 | Fill #0

## 2020-01-29 NOTE — Telephone Encounter (Signed)
Requested Prescriptions  Pending Prescriptions Disp Refills   DULoxetine (CYMBALTA) 20 MG capsule [Pharmacy Med Name: DULOXETINE HCL 20MG  CPEP 20 Capsule] 90 capsule 1    Sig: TAKE 1 CAPSULE BY MOUTH DAILY.     Psychiatry: Antidepressants - SNRI Passed - 01/29/2020  9:52 AM      Passed - Last BP in normal range    BP Readings from Last 1 Encounters:  01/04/20 100/66         Passed - Valid encounter within last 6 months    Recent Outpatient Visits          3 weeks ago Abnormal weight loss   [place], MD   5 months ago Change in bowel habits   [place], MD   6 months ago Essential hypertension   [place], [place], NP   9 months ago COVID-19 virus detected   [place], [place], [place]   1 year ago [place], MD      Future Appointments            In 1 month Antony Blackbird, MD [place]

## 2020-01-31 ENCOUNTER — Encounter: Payer: Self-pay | Admitting: Family Medicine

## 2020-02-01 ENCOUNTER — Other Ambulatory Visit: Payer: Self-pay | Admitting: Family Medicine

## 2020-02-01 DIAGNOSIS — R634 Abnormal weight loss: Secondary | ICD-10-CM

## 2020-02-01 NOTE — Progress Notes (Signed)
Patient ID: Isabel Evans, female   DOB: 26-Nov-1970, 49 y.o.   MRN: [id number]   Patient left message that she needs a new referral to GI. Will place referral again but this was recently done at her last appointment after she did not follow-up again after her prior appointment

## 2020-02-05 NOTE — Progress Notes (Signed)
Cardiology Office Note:   Date:  02/07/2020  NAME:  Isabel Evans    MRN: [id number] DOB:  [birth year]   PCP:  Antony Blackbird, MD  Cardiologist:  Evalina Field, MD   Referring MD: Antony Blackbird, MD   Chief Complaint  Patient presents with  . Palpitations   History of Present Illness:   Isabel Evans is a 49 y.o. female with a hx of HTN, HLD who presents for follow-up of palpitations. Monitor last year without significant arrhythmias. Single PVC on EKG but EF normal and no significant amount on monitor.   She reports she still having episodes of palpitations.  She reports they occur several times per day.  They occur daily.  Symptoms can occur with stress and anxiety.  She reports significant stress in her life with her mother.  Apparently her mother lost her home in a fire.  There are also financial struggles as well.  She is working part-time as a Educational psychologist.  She reports no identifiable trigger.  Reports her symptoms can last minutes.  They resolve without intervention.  They just go away on their own.  Her blood pressure is well controlled today.  She is not diabetic.  She is on Metformin as she used to be diabetic but likely can come off this.  Her EKG today shows normal sinus rhythm without any acute ischemic changes or evidence of prior infarction.  Thyroid studies from this year are normal.  She also had a recent lipid profile shows an LDL cholesterol of 62 and HDL of 69.  Past Medical History: Past Medical History:  Diagnosis Date  . Anemia 11/24/2017  . Anxiety   . Bone spur    cervical spine  . Chronic kidney disease 04/2017   kidney infections  . Depression   . Deviated nasal septum    states is unable to lie flat, because her nose will become congested and she can stop breathing  . Diabetes mellitus without complication ([place])   . Dysrhythmia    heart flutters occasionally  . Fibromyalgia   . Fibromyalgia   . GERD (gastroesophageal reflux disease)   . Hallux  abductovalgus with bunions 09/2014   right   . Hammertoe 09/2014   right 4th, 5th  . History of stomach ulcers   . Hyperlipidemia   . Hypertension    states is under control with med., has been on med. x 8 mos.  . IBS (irritable bowel syndrome)    no current med.  . Osteoarthritis    hands, bilateral hips  . Peripheral vascular disease (HCC)    occasional swelling in both legs  . Sinus headache     Past Surgical History: Past Surgical History:  Procedure Laterality Date  . ABDOMINAL HYSTERECTOMY    . BREAST BIOPSY Left 04/2018   benign  . BREAST BIOPSY Left 2016   benign  . BUNIONECTOMY Right 10/04/2014   Procedure:  Altamese [place]ico, Emmaline Life;  Surgeon: Trula Slade, DPM;  Location: [facility];  Service: Podiatry;  Laterality: Right;  . COLONOSCOPY WITH PROPOFOL  02/13/2013  . CYSTOSCOPY N/A 07/07/2017   Procedure: CYSTOSCOPY;  Surgeon: Aletha Halim, MD;  Location: [facility];  Service: Gynecology;  Laterality: N/A;  . DUPUYTREN / PALMAR FASCIOTOMY Right 07/18/2013   exc. of multiple nodules  . 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 HAMMER TOE SURGERY Right 10/04/2014   Procedure: HAMMER TOE REPAIR 4TH  AND 5TH  RIGHT FOOT  fourth toe fixation with k wire;  Surgeon: Trula Slade, DPM;  Location: [facility];  Service: Podiatry;  Laterality: Right;  . 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 TUBAL LIGATION    . VAGINAL HYSTERECTOMY N/A 07/07/2017   Procedure: HYSTERECTOMY VAGINAL;  Surgeon: Aletha Halim, MD;  Location: [facility];  Service: Gynecology;  Laterality: N/A;    Current Medications: Current Meds  Medication Sig  . amLODipine (NORVASC) 10 MG tablet TAKE 1 TABLET (10 MG TOTAL) BY MOUTH DAILY.  . clonazePAM (KLONOPIN) 0.5 MG tablet TAKE 1/2 TABLET BY MOUTH TWICE DAILY AS NEEDED FOR ANXIETY  . diclofenac (VOLTAREN) 50 MG EC tablet TAKE 1 TABLET (50 MG TOTAL) BY MOUTH 2 (TWO)  TIMES DAILY. AFTER A MEAL AS NEEDED FOR PAIN  . dicyclomine (BENTYL) 10 MG capsule TAKE 1 CAPSULE BY MOUTH 4 TIMES DAILY AS NEEDED FOR SPASMS.  Marland Kitchen diphenhydrAMINE (BENADRYL) 25 MG tablet Take 25 mg by mouth daily as needed for allergies.  . DULoxetine (CYMBALTA) 20 MG capsule TAKE 1 CAPSULE BY MOUTH DAILY.  . fluticasone (FLONASE) 50 MCG/ACT nasal spray PLACE 2 SPRAYS INTO BOTH NOSTRILS DAILY.  Marland Kitchen gabapentin (NEURONTIN) 300 MG capsule One pill three times per day as needed for nerve pain  . metFORMIN (GLUCOPHAGE) 500 MG tablet TAKE 1 TABLET (500 MG TOTAL) BY MOUTH DAILY WITH BREAKFAST.  Marland Kitchen omeprazole (PRILOSEC) 20 MG capsule Take 2 capsules (40 mg total) by mouth daily.  . ondansetron (ZOFRAN-ODT) 8 MG disintegrating tablet TAKE 1 TABLET (8 MG TOTAL) BY MOUTH DAILY AS NEEDED FOR NAUSEA OR VOMITING.  . rosuvastatin (CRESTOR) 20 MG tablet Take 1 tablet (20 mg total) by mouth daily. To lower cholesterol  . tiZANidine (ZANAFLEX) 4 MG tablet Take 1 tablet (4 mg total) by mouth every 8 (eight) hours as needed for muscle spasms. Start with 1/2 pill (2 mg) as patient can cause drowsiness  . traMADol (ULTRAM) 50 MG tablet TAKE 1 TABLET (50 MG TOTAL) BY MOUTH 3 (THREE) TIMES DAILY. AS NEEDED FOR PAIN. (30 DAY SUPPLY)  . venlafaxine XR (EFFEXOR-XR) 150 MG 24 hr capsule TAKE 1 CAPSULE BY MOUTH DAILY WITH BREAKFAST.     Allergies:    Patient has no known allergies.   Social History: Social History   Socioeconomic History  . Marital status: Single    Spouse name: Not on file  . Number of children: 3  . Years of education: Not on file  . Highest education level: 7th grade  Occupational History  . Occupation: cook  Tobacco Use  . Smoking status: Former Smoker    Packs/day: 1.00    Years: 20.00    Pack years: 20.00    Types: Cigarettes    Quit date: 08/24/2012    Years since quitting: 7.4  . Smokeless tobacco: Never Used  Vaping Use  . Vaping Use: Some days  Substance and Sexual Activity  .  Alcohol use: Yes    Comment: occasionally  . Drug use: No  . Sexual activity: Yes    Birth control/protection: Surgical  Other Topics Concern  . Not on file  Social History Narrative  . Not on file   Social Determinants of Health   Financial Resource Strain:   . Difficulty of Paying Living Expenses: Not on file  Food Insecurity:   . Worried About Charity fundraiser in the Last Year: Not on file  . Ran Out  of Food in the Last Year: Not on file  Transportation Needs: No Transportation Needs  . Lack of Transportation (Medical): No  . Lack of Transportation (Non-Medical): No  Physical Activity:   . Days of Exercise per Week: Not on file  . Minutes of Exercise per Session: Not on file  Stress:   . Feeling of Stress : Not on file  Social Connections:   . Frequency of Communication with Friends and Family: Not on file  . Frequency of Social Gatherings with Friends and Family: Not on file  . Attends Religious Services: Not on file  . Active Member of Clubs or Organizations: Not on file  . Attends Archivist Meetings: Not on file  . Marital Status: Not on file     Family History: The patient's family history includes Alcohol abuse in her father; Bone cancer in her maternal grandmother; Breast cancer in her maternal aunt and maternal grandmother; Colon cancer in her maternal aunt; Diabetes in her father, mother, and sister; Heart disease in her father, maternal grandmother, and mother; Hypertension in her father, mother, and sister; Ovarian cancer in her maternal aunt; Prostate cancer in her father; Rheum arthritis in her sister.  ROS:   All other ROS reviewed and negative. Pertinent positives noted in the HPI.     EKGs/Labs/Other Studies Reviewed:   The following studies were personally reviewed by me today:  EKG:  EKG is ordered today.  The ekg ordered today demonstrates normal sinus rhythm, heart rate 68, no acute ischemic changes, no evidence of prior infarction, and  was personally reviewed by me.   [place] 02/20/2019 Patient had a min HR of 62 bpm (normal sinus rhythm), max HR of 136 bpm (sinus tachycardia), and avg HR of 90 bpm (normal sinus rhythm). Predominant underlying rhythm was Sinus Rhythm. 1 run of Supraventricular Tachycardia occurred lasting 12 beats with a max rate of 125 bpm (avg 107 bpm). Isolated SVEs were rare (<1.0%), SVE Couplets were rare (<1.0%), and no SVE Triplets were present. Isolated VEs were occasional (3.0%, 11185), VE Couplets were rare (<1.0%, 83), and no VE Triplets were present. Ventricular Trigeminy was present. No atrial fibrillation. No patient diary submitted.   Impression:   1. Brief SVT of 12 beat duration.  2. No sustained arrhythmias.    TTE 02/02/2019 1. Left ventricular ejection fraction, by visual estimation, is 55 to  60%. The left ventricle has normal function. Normal left ventricular size.  There is no left ventricular hypertrophy.  2. Global right ventricle has normal systolic function.The right  ventricular size is normal. No increase in right ventricular wall  thickness.  3. Left atrial size was normal.  4. Right atrial size was normal.  5. The mitral valve is normal in structure. Trace mitral valve  regurgitation. No evidence of mitral stenosis.  6. The tricuspid valve is normal in structure. Tricuspid valve  regurgitation is trivial.  7. The aortic valve is tricuspid Aortic valve regurgitation is trivial by  color flow Doppler. Mild aortic valve sclerosis without stenosis.  8. The pulmonic valve was normal in structure. Pulmonic valve  regurgitation is trivial by color flow Doppler.  9. TR signal is inadequate for assessing pulmonary artery systolic  pressure.  10. The inferior vena cava is normal in size with greater than 50%  respiratory variability, suggesting right atrial pressure of 3 mmHg.   Recent Labs: 01/04/2020: ALT 17; BUN 8; Creatinine, Ser 0.70; Hemoglobin 13.2; Platelets 280;  Potassium 4.5; Sodium 140; TSH  0.606   Recent Lipid Panel    Component Value Date/Time   CHOL 147 08/24/2019 0847   TRIG 87 08/24/2019 0847   HDL 69 08/24/2019 0847   CHOLHDL 2.1 08/24/2019 0847   LDLCALC 62 08/24/2019 0847    Physical Exam:   VS:  BP 114/75   Pulse 68   Ht 5\' 9"  (1.753 m)   Wt 164 lb 6.4 oz (74.6 kg)   LMP 07/04/2017   SpO2 99%   BMI 24.28 kg/m    Wt Readings from Last 3 Encounters:  02/07/20 164 lb 6.4 oz (74.6 kg)  01/04/20 163 lb (73.9 kg)  08/23/19 167 lb 12.8 oz (76.1 kg)    General: Well nourished, well developed, in no acute distress Heart: Atraumatic, normal size  Eyes: PEERLA, EOMI  Neck: Supple, no JVD Endocrine: No thryomegaly Cardiac: Normal S1, S2; RRR; no murmurs, rubs, or gallops Lungs: Clear to auscultation bilaterally, no wheezing, rhonchi or rales  Abd: Soft, nontender, no hepatomegaly  Ext: No edema, pulses 2+ Musculoskeletal: No deformities, BUE and BLE strength normal and equal Skin: Warm and dry, no rashes   Neuro: Alert and oriented to person, place, time, and situation, CNII-XII grossly intact, no focal deficits  Psych: Normal mood and affect   ASSESSMENT:   Isabel Evans is a 49 y.o. female who presents for the following: 1. Palpitations   2. Mixed hyperlipidemia     PLAN:   1. Palpitations -Normal echo.  Normal EKG.  Monitor without significant arrhythmia.  Suspect her symptoms are related to stress and anxiety.  She will work on stress reduction strategies.  Her thyroid studies were normal.  We also discussed the cardia mobile device.  This would be a way for her to monitor her heart rhythm at home.  I think this would further reassure her that everything is okay.  She will can do this.  She will see [place] as needed.  2. Mixed hyperlipidemia -Well-controlled on cholesterol medication.   Disposition: No follow-ups on file.  Medication Adjustments/Labs and Tests Ordered: Current medicines are reviewed at length with  the patient today.  Concerns regarding medicines are outlined above.  No orders of the defined types were placed in this encounter.  No orders of the defined types were placed in this encounter.   Patient Instructions  Medication Instructions:  Your physician recommends that you continue on your current medications as directed. Please refer to the Current Medication list given to you today.  Follow-Up: At [facility], you and your health needs are our priority.  As part of our continuing mission to provide you with exceptional heart care, we have created designated Provider Care Teams.  These Care Teams include your primary Cardiologist (physician) and Advanced Practice Providers (APPs -  Physician Assistants and Nurse Practitioners) who all work together to provide you with the care you need, when you need it.  We recommend signing up for the patient portal called "MyChart".  Sign up information is provided on this After Visit Summary.  MyChart is used to connect with patients for Virtual Visits (Telemedicine).  Patients are able to view lab/test results, encounter notes, upcoming appointments, etc.  Non-urgent messages can be sent to your provider as well.   To learn more about what you can do with MyChart, go to [email or web address].    Your next appointment:   AS NEEDED with Dr. Audie Box       Time Spent with Patient: I have spent a total of  25 minutes with patient reviewing hospital notes, telemetry, EKGs, labs and examining the patient as well as establishing an assessment and plan that was discussed with the patient.  > 50% of time was spent in direct patient care.  Signed, Addison Naegeli. Audie Box, [place]  [address] [place], [place] [address] [phone]  02/07/2020 2:02 PM

## 2020-02-07 ENCOUNTER — Ambulatory Visit (INDEPENDENT_AMBULATORY_CARE_PROVIDER_SITE_OTHER): Payer: No Typology Code available for payment source | Admitting: Cardiovascular Disease

## 2020-02-07 ENCOUNTER — Encounter: Payer: Self-pay | Admitting: Cardiovascular Disease

## 2020-02-07 ENCOUNTER — Other Ambulatory Visit: Payer: Self-pay

## 2020-02-07 VITALS — BP 114/75 | HR 68 | Ht 69.0 in | Wt 164.4 lb

## 2020-02-07 DIAGNOSIS — R002 Palpitations: Secondary | ICD-10-CM

## 2020-02-07 DIAGNOSIS — E782 Mixed hyperlipidemia: Secondary | ICD-10-CM

## 2020-02-07 NOTE — Addendum Note (Signed)
Addended by: Wonda Horner on: 02/07/2020 04:04 PM   Modules accepted: Orders

## 2020-02-07 NOTE — Patient Instructions (Signed)
Medication Instructions:  Your physician recommends that you continue on your current medications as directed. Please refer to the Current Medication list given to you today.  Follow-Up: At [facility], you and your health needs are our priority.  As part of our continuing mission to provide you with exceptional heart care, we have created designated Provider Care Teams.  These Care Teams include your primary Cardiologist (physician) and Advanced Practice Providers (APPs -  Physician Assistants and Nurse Practitioners) who all work together to provide you with the care you need, when you need it.  We recommend signing up for the patient portal called "MyChart".  Sign up information is provided on this After Visit Summary.  MyChart is used to connect with patients for Virtual Visits (Telemedicine).  Patients are able to view lab/test results, encounter notes, upcoming appointments, etc.  Non-urgent messages can be sent to your provider as well.   To learn more about what you can do with MyChart, go to [email or web address].    Your next appointment:   AS NEEDED with Dr. Audie Box

## 2020-02-11 ENCOUNTER — Other Ambulatory Visit: Payer: Self-pay | Admitting: Family Medicine

## 2020-02-11 DIAGNOSIS — M2550 Pain in unspecified joint: Secondary | ICD-10-CM

## 2020-02-11 DIAGNOSIS — M797 Fibromyalgia: Secondary | ICD-10-CM

## 2020-02-11 DIAGNOSIS — R7303 Prediabetes: Secondary | ICD-10-CM

## 2020-02-11 DIAGNOSIS — M545 Low back pain, unspecified: Secondary | ICD-10-CM

## 2020-02-11 DIAGNOSIS — M542 Cervicalgia: Secondary | ICD-10-CM

## 2020-02-11 DIAGNOSIS — I10 Essential (primary) hypertension: Secondary | ICD-10-CM

## 2020-02-11 MED FILL — traMADol HCL 50 MG TABS: 50 | 30 days supply | Qty: 90 | Fill #1

## 2020-02-11 MED FILL — ?METFORMIN HCL 500MG TABL: 500 | 30 days supply | Qty: 30 | Fill #0

## 2020-02-11 MED FILL — OMEPRAZOLE 20 MG CAP: 20 | 30 days supply | Qty: 60 | Fill #2

## 2020-02-11 MED FILL — GABAPENTIN 300 MG CAPSULE: 300 | 30 days supply | Qty: 120 | Fill #0

## 2020-02-11 MED FILL — ?DULOXETINE HCL 20MG CPE: 20 | 30 days supply | Qty: 30 | Fill #0

## 2020-02-11 MED FILL — DICYCLOMINE 10 MG CAPSULE: 10 | 15 days supply | Qty: 60 | Fill #1

## 2020-02-11 MED FILL — AMLODIPINE BESYLATE 10 MG T: 10 | 30 days supply | Qty: 30 | Fill #0

## 2020-02-11 MED FILL — ROSUVASTATIN CALCIUM 20 MG: 20 | 30 days supply | Qty: 30 | Fill #1

## 2020-02-11 MED FILL — tiZANidine HCL 4 MG TABS: 4 | 30 days supply | Qty: 90 | Fill #1

## 2020-02-11 MED FILL — DICLOFENAC SOD EC 50 MG TAB: 50 | 30 days supply | Qty: 60 | Fill #0

## 2020-02-11 MED FILL — VENLAFAXINE HCL ER 150 MG C: 150 | 30 days supply | Qty: 30 | Fill #3

## 2020-02-11 NOTE — Telephone Encounter (Signed)
Requested Prescriptions  Pending Prescriptions Disp Refills  . metFORMIN (GLUCOPHAGE) 500 MG tablet [Pharmacy Med Name: METFORMIN HCL $RemoveBefor'500MG'aRAPieUKnlYx$  TABL 500 Tablet] 30 tablet 0    Sig: TAKE 1 TABLET (500 MG TOTAL) BY MOUTH DAILY WITH BREAKFAST.     Endocrinology:  Diabetes - Biguanides Passed - 02/11/2020  9:22 AM      Passed - Cr in normal range and within 360 days    Creat  Date Value Ref Range Status  11/27/2015 0.75 0.50 - 1.10 mg/dL Final   Creatinine, Ser  Date Value Ref Range Status  01/04/2020 0.70 0.57 - 1.00 mg/dL Final         Passed - HBA1C is between 0 and 7.9 and within 180 days    Hgb A1c MFr Bld  Date Value Ref Range Status  01/04/2020 5.3 4.8 - 5.6 % Final    Comment:             Prediabetes: 5.7 - 6.4          Diabetes: >6.4          Glycemic control for adults with diabetes: <7.0          Passed - eGFR in normal range and within 360 days    GFR calc Af Amer  Date Value Ref Range Status  01/04/2020 118 >59 mL/min/1.73 Final    Comment:    **[facility] currently reports eGFR in compliance with the current**   recommendations of the [employer]. [facility] will   update reporting as new guidelines are published from the NKF-ASN   Task force.    GFR calc non Af Amer  Date Value Ref Range Status  01/04/2020 103 >59 mL/min/1.73 Final         Passed - Valid encounter within last 6 months    Recent Outpatient Visits          1 month ago Abnormal weight loss   [place], MD   5 months ago Change in bowel habits   [place], MD   7 months ago Essential hypertension   [place], [place], NP   10 months ago COVID-19 virus detected   [place], [place], [place]   1 year ago [place], MD      Future Appointments            In  2 weeks Antony Blackbird, MD [place]           . gabapentin (NEURONTIN) 300 MG capsule [Pharmacy Med Name: GABAPENTIN 300 MG CAPSULE 300 Capsule] 120 capsule 0    Sig: TAKE 1 CAPSULE BY MOUTH 2 TIMES DAILY AND 2 CAPSULES AT BEDTIME.     Neurology: Anticonvulsants - gabapentin Passed - 02/11/2020  9:22 AM      Passed - Valid encounter within last 12 months    Recent Outpatient Visits          1 month ago Abnormal weight loss   [place], MD   5 months ago Change in bowel habits   [place], MD   7 months ago Essential hypertension   [place], [place], NP   10 months ago COVID-19 virus  detected   [place], Dionne [place], [place]   1 year ago [place], MD      Future Appointments            In 2 weeks Antony Blackbird, MD [place]

## 2020-02-13 MED FILL — FLUTICASONE PROP 50 MCG SPR: 50 | 30 days supply | Qty: 16 | Fill #2

## 2020-02-18 MED FILL — clonazePAM 0.5 MG TABS: 0.5 | 30 days supply | Qty: 30 | Fill #2

## 2020-02-28 ENCOUNTER — Other Ambulatory Visit: Payer: Self-pay

## 2020-02-28 ENCOUNTER — Ambulatory Visit: Payer: No Typology Code available for payment source | Attending: Family Medicine | Admitting: Family Medicine

## 2020-02-28 ENCOUNTER — Encounter: Payer: Self-pay | Admitting: Family Medicine

## 2020-02-28 VITALS — BP 108/74 | HR 77 | Wt 159.0 lb

## 2020-02-28 DIAGNOSIS — R110 Nausea: Secondary | ICD-10-CM

## 2020-02-28 DIAGNOSIS — G4486 Cervicogenic headache: Secondary | ICD-10-CM

## 2020-02-28 DIAGNOSIS — M542 Cervicalgia: Secondary | ICD-10-CM

## 2020-02-28 DIAGNOSIS — S025XXA Fracture of tooth (traumatic), initial encounter for closed fracture: Secondary | ICD-10-CM

## 2020-02-28 DIAGNOSIS — R634 Abnormal weight loss: Secondary | ICD-10-CM

## 2020-02-28 DIAGNOSIS — M545 Low back pain, unspecified: Secondary | ICD-10-CM

## 2020-02-28 NOTE — Progress Notes (Signed)
PAIN IN NECK AND BACK COLONOSCOPY REFERRAL

## 2020-02-28 NOTE — Progress Notes (Signed)
Established Patient Office Visit  Subjective:  Patient ID: Isabel Evans, female    DOB: Sep 09, 1970  Age: 49 y.o. MRN: [id number]  CC:  Chief Complaint  Patient presents with  . Follow-up    HPI Janavia Evans, 49 year old female, who presents secondary to complaint of continued issues with neck pain along with right-sided headaches which started the back of the scalp on the right and sometimes radiates over to the left and can radiate up to the scalp to both the ears.  She states that she has been told by her orthopedic doctor that she has cervical spondylosis.  She also complains of continued issues with low back pain with radiation of pain down the left leg.  She continues to have issues with bilateral hand pain and continues to have issues with dropping objects due to Dupuytren's contractures.  She also believes that she has lost a few more pounds since her last visit.  She continues to have issues with unintentional weight loss.  She also reports continued issues with chronic nausea for which she is taking Zofran as needed.  She feels that her stool is narrower but not ribbon thin.  She denies any blood in the stool or black stools.  At the end of the visit, patient recalled needing dental referral as she states that she has broken a right upper tooth and also feels as if one of her right lower molars may be loose.  She reports that her cardiologist has suggested that she can stop the use of her blood pressure medication, amlodipine 10 mg, as her blood pressures have been low normal since she has lost weight.  She reports that she does have a home blood pressure monitor.  Past Medical History:  Diagnosis Date  . Anemia 11/24/2017  . Anxiety   . Bone spur    cervical spine  . Chronic kidney disease 04/2017   kidney infections  . Depression   . Deviated nasal septum    states is unable to lie flat, because her nose will become congested and she can stop breathing  . Diabetes  mellitus without complication ([place])   . Dysrhythmia    heart flutters occasionally  . Fibromyalgia   . Fibromyalgia   . GERD (gastroesophageal reflux disease)   . Hallux abductovalgus with bunions 09/2014   right   . Hammertoe 09/2014   right 4th, 5th  . History of stomach ulcers   . Hyperlipidemia   . Hypertension    states is under control with med., has been on med. x 8 mos.  . IBS (irritable bowel syndrome)    no current med.  . Osteoarthritis    hands, bilateral hips  . Peripheral vascular disease (HCC)    occasional swelling in both legs  . Sinus headache     Past Surgical History:  Procedure Laterality Date  . ABDOMINAL HYSTERECTOMY    . BREAST BIOPSY Left 04/2018   benign  . BREAST BIOPSY Left 2016   benign  . BUNIONECTOMY Right 10/04/2014   Procedure:  Altamese [place], Emmaline Life;  Surgeon: Trula Slade, DPM;  Location: [facility];  Service: Podiatry;  Laterality: Right;  . COLONOSCOPY WITH PROPOFOL  02/13/2013  . CYSTOSCOPY N/A 07/07/2017   Procedure: CYSTOSCOPY;  Surgeon: Aletha Halim, MD;  Location: [facility];  Service: Gynecology;  Laterality: N/A;  . DUPUYTREN / PALMAR FASCIOTOMY Right 07/18/2013   exc. of multiple nodules  . DUPUYTREN CONTRACTURE RELEASE Left 07/18/2013  small finger  . HAMMER TOE SURGERY Right 10/04/2014   Procedure: HAMMER TOE REPAIR 4TH  AND 5TH  RIGHT FOOT  fourth toe fixation with k wire;  Surgeon: Trula Slade, DPM;  Location: [facility];  Service: Podiatry;  Laterality: Right;  . 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 TUBAL LIGATION    . VAGINAL HYSTERECTOMY N/A 07/07/2017   Procedure: HYSTERECTOMY VAGINAL;  Surgeon: Aletha Halim, MD;  Location: [facility];  Service: Gynecology;  Laterality: N/A;    Family History  Problem Relation Age of Onset  . Hypertension Mother   . Diabetes Mother   . Heart disease Mother   . Hypertension Father   . Diabetes Father   .  Prostate cancer Father   . Heart disease Father   . Alcohol abuse Father   . Hypertension Sister   . Diabetes Sister   . Heart disease Maternal Grandmother        Great GM  . Breast cancer Maternal Grandmother   . Bone cancer Maternal Grandmother   . Breast cancer Maternal Aunt   . Colon cancer Maternal Aunt   . Ovarian cancer Maternal Aunt   . Rheum arthritis Sister     Social History   Socioeconomic History  . Marital status: Single    Spouse name: Not on file  . Number of children: 3  . Years of education: Not on file  . Highest education level: 7th grade  Occupational History  . Occupation: cook  Tobacco Use  . Smoking status: Former Smoker    Packs/day: 1.00    Years: 20.00    Pack years: 20.00    Types: Cigarettes    Quit date: 08/24/2012    Years since quitting: 7.5  . Smokeless tobacco: Never Used  Vaping Use  . Vaping Use: Some days  Substance and Sexual Activity  . Alcohol use: Yes    Comment: occasionally  . Drug use: No  . Sexual activity: Yes    Birth control/protection: Surgical  Other Topics Concern  . Not on file  Social History Narrative  . Not on file   Social Determinants of Health   Financial Resource Strain:   . Difficulty of Paying Living Expenses: Not on file  Food Insecurity:   . Worried About Charity fundraiser in the Last Year: Not on file  . Ran Out of Food in the Last Year: Not on file  Transportation Needs: No Transportation Needs  . Lack of Transportation (Medical): No  . Lack of Transportation (Non-Medical): No  Physical Activity:   . Days of Exercise per Week: Not on file  . Minutes of Exercise per Session: Not on file  Stress:   . Feeling of Stress : Not on file  Social Connections:   . Frequency of Communication with Friends and Family: Not on file  . Frequency of Social Gatherings with Friends and Family: Not on file  . Attends Religious Services: Not on file  . Active Member of Clubs or Organizations: Not on file  .  Attends Archivist Meetings: Not on file  . Marital Status: Not on file  Intimate Partner Violence:   . Fear of Current or Ex-Partner: Not on file  . Emotionally Abused: Not on file  . Physically Abused: Not on file  . Sexually Abused: Not on file    Outpatient Medications Prior to Visit  Medication Sig Dispense Refill  .  amLODipine (NORVASC) 10 MG tablet TAKE 1 TABLET (10 MG TOTAL) BY MOUTH DAILY. 30 tablet 0  . clonazePAM (KLONOPIN) 0.5 MG tablet TAKE 1/2 TABLET BY MOUTH TWICE DAILY AS NEEDED FOR ANXIETY 30 tablet 2  . diclofenac (VOLTAREN) 50 MG EC tablet TAKE 1 TABLET (50 MG TOTAL) BY MOUTH 2 (TWO) TIMES DAILY. AFTER A MEAL AS NEEDED FOR PAIN 60 tablet 0  . dicyclomine (BENTYL) 10 MG capsule TAKE 1 CAPSULE BY MOUTH 4 TIMES DAILY AS NEEDED FOR SPASMS. 60 capsule 2  . diphenhydrAMINE (BENADRYL) 25 MG tablet Take 25 mg by mouth daily as needed for allergies.    . DULoxetine (CYMBALTA) 20 MG capsule TAKE 1 CAPSULE BY MOUTH DAILY. 90 capsule 1  . fluticasone (FLONASE) 50 MCG/ACT nasal spray PLACE 2 SPRAYS INTO BOTH NOSTRILS DAILY. 16 g 11  . gabapentin (NEURONTIN) 300 MG capsule TAKE 1 CAPSULE BY MOUTH 2 TIMES DAILY AND 2 CAPSULES AT BEDTIME. 120 capsule 0  . metFORMIN (GLUCOPHAGE) 500 MG tablet TAKE 1 TABLET (500 MG TOTAL) BY MOUTH DAILY WITH BREAKFAST. 30 tablet 0  . omeprazole (PRILOSEC) 20 MG capsule Take 2 capsules (40 mg total) by mouth daily. 60 capsule 2  . ondansetron (ZOFRAN-ODT) 8 MG disintegrating tablet TAKE 1 TABLET (8 MG TOTAL) BY MOUTH DAILY AS NEEDED FOR NAUSEA OR VOMITING. 20 tablet 2  . rosuvastatin (CRESTOR) 20 MG tablet Take 1 tablet (20 mg total) by mouth daily. To lower cholesterol 30 tablet 2  . tiZANidine (ZANAFLEX) 4 MG tablet Take 1 tablet (4 mg total) by mouth every 8 (eight) hours as needed for muscle spasms. Start with 1/2 pill (2 mg) as patient can cause drowsiness 90 tablet 5  . traMADol (ULTRAM) 50 MG tablet TAKE 1 TABLET (50 MG TOTAL) BY MOUTH 3  (THREE) TIMES DAILY. AS NEEDED FOR PAIN. (30 DAY SUPPLY) 90 tablet 5  . venlafaxine XR (EFFEXOR-XR) 150 MG 24 hr capsule TAKE 1 CAPSULE BY MOUTH DAILY WITH BREAKFAST. 30 capsule 3   No facility-administered medications prior to visit.    No Known Allergies  ROS Review of Systems  Constitutional: Positive for fatigue. Negative for chills and fever.  HENT: Negative for sore throat and trouble swallowing.   Respiratory: Negative for cough and shortness of breath.   Cardiovascular: Negative for chest pain and palpitations.  Gastrointestinal: Positive for nausea. Negative for abdominal pain, constipation and diarrhea.  Endocrine: Negative for polydipsia, polyphagia and polyuria.  Genitourinary: Negative for dysuria and frequency.  Musculoskeletal: Positive for arthralgias, back pain, neck pain and neck stiffness.  Skin: Negative for rash and wound.  Hematological: Negative for adenopathy. Does not bruise/bleed easily.  Psychiatric/Behavioral: Negative for suicidal ideas. The patient is nervous/anxious.       Objective:    Physical Exam Vitals and nursing note reviewed.  Constitutional:      General: She is not in acute distress.    Appearance: Normal appearance.  HENT:     Mouth/Throat:     Comments: Broken right upper tooth which appears to be an incisor Cardiovascular:     Rate and Rhythm: Normal rate and regular rhythm.  Pulmonary:     Effort: Pulmonary effort is normal.     Breath sounds: Normal breath sounds.  Abdominal:     Palpations: Abdomen is soft.     Tenderness: There is no abdominal tenderness. There is no guarding or rebound.  Musculoskeletal:        General: Tenderness (Lumbosacral and left SI joint area  tenderness to palpation.  Tenderness over the posterior cervical spine and cervical and lumbosacral paraspinous muscle spasm.  Tenderness at the bilateral posterior scalp occipital ridges right greater than left) and deformity (Dupuytren's contractures of the  hands) present.     Cervical back: Tenderness present.  Lymphadenopathy:     Cervical: No cervical adenopathy.  Skin:    General: Skin is warm and dry.  Neurological:     Mental Status: She is alert and oriented to person, place, and time.  Psychiatric:        Mood and Affect: Mood normal.        Behavior: Behavior normal.     BP 108/74 (BP Location: Left Arm, Patient Position: Sitting)   Pulse 77   Wt 159 lb (72.1 kg)   LMP 07/04/2017   SpO2 98%   BMI 23.48 kg/m  Wt Readings from Last 3 Encounters:  02/28/20 159 lb (72.1 kg)  02/07/20 164 lb 6.4 oz (74.6 kg)  01/04/20 163 lb (73.9 kg)     Health Maintenance Due  Topic Date Due  . Hepatitis C Screening  Never done  . COVID-19 Vaccine (1) Never done      Lab Results  Component Value Date   TSH 0.606 01/04/2020   Lab Results  Component Value Date   WBC 6.1 01/04/2020   HGB 13.2 01/04/2020   HCT 39.4 01/04/2020   MCV 92 01/04/2020   PLT 280 01/04/2020   Lab Results  Component Value Date   NA 140 01/04/2020   K 4.5 01/04/2020   CO2 26 01/04/2020   GLUCOSE 94 01/04/2020   BUN 8 01/04/2020   CREATININE 0.70 01/04/2020   BILITOT 0.3 01/04/2020   ALKPHOS 63 01/04/2020   AST 15 01/04/2020   ALT 17 01/04/2020   PROT 6.9 01/04/2020   ALBUMIN 4.5 01/04/2020   CALCIUM 9.5 01/04/2020   ANIONGAP 11 06/27/2017   Lab Results  Component Value Date   CHOL 147 08/24/2019   Lab Results  Component Value Date   HDL 69 08/24/2019   Lab Results  Component Value Date   LDLCALC 62 08/24/2019   Lab Results  Component Value Date   TRIG 87 08/24/2019   Lab Results  Component Value Date   CHOLHDL 2.1 08/24/2019   Lab Results  Component Value Date   HGBA1C 5.3 01/04/2020      Assessment & Plan:  1. Neck pain 2. Cervicogenic headache Referral placed for patient to follow-up with orthopedics regarding cervicogenic headaches and chronic issues with neck pain - AMB referral to orthopedics  3. Low back pain  with radiation She reports continued issues with low back pain with radiation to the left leg.  Referral placed to orthopedics as patient may be able to receive injections to help with her chronic back pain issues - AMB referral to orthopedics  4. Nausea; 6. Unintentional weight loss She reports continued issues with nausea as well as continued unintentional weight loss.  Patient has been referred to gastroenterology several times but she reports that she does not recall being contacted by gastroenterology.  Review of chart, several prior gastroenterology referrals have been closed due to patient not returning calls regarding an appointment.  New referral placed to gastroenterology and patient was also given the number to contact the gastroenterology office. - Ambulatory referral to Gastroenterology  5. Closed fracture of tooth, initial encounter She reports that she needs referral to dentistry due to a broken tooth as well as possible loose  tooth.  Dental referral placed. - Ambulatory referral to Dentistry     Follow-up: No follow-ups on file.    Antony Blackbird, MD

## 2020-02-29 ENCOUNTER — Encounter: Payer: Self-pay | Admitting: Nurse Practitioner

## 2020-03-04 ENCOUNTER — Encounter: Payer: Self-pay | Admitting: Orthopaedic Surgery

## 2020-03-04 ENCOUNTER — Ambulatory Visit (INDEPENDENT_AMBULATORY_CARE_PROVIDER_SITE_OTHER): Payer: Self-pay | Admitting: Orthopaedic Surgery

## 2020-03-04 DIAGNOSIS — M65331 Trigger finger, right middle finger: Secondary | ICD-10-CM

## 2020-03-04 MED ORDER — METHYLPREDNISOLONE ACETATE 40 MG/ML IJ SUSP
13.3300 mg | INTRAMUSCULAR | Status: AC | PRN
  Administered 2020-03-04: 13.33 mg

## 2020-03-04 MED ORDER — BUPIVACAINE HCL 0.25 % IJ SOLN
0.3300 mL | INTRAMUSCULAR | Status: AC | PRN
  Administered 2020-03-04: .33 mL

## 2020-03-04 MED ORDER — LIDOCAINE HCL 1 % IJ SOLN
1.0000 mL | INTRAMUSCULAR | Status: AC | PRN
  Administered 2020-03-04: 1 mL

## 2020-03-04 NOTE — Progress Notes (Signed)
Office Visit Note   Patient: Isabel Evans           Date of Birth: 1970/12/03           MRN: [id number] Visit Date: 03/04/2020              Requested by: Antony Blackbird, MD [place],  [place] [address] PCP: Antony Blackbird, MD   Assessment & Plan: Visit Diagnoses:  1. Trigger finger, right middle finger     Plan: Impression is right long trigger finger and early Dupuytren's contracture.  We will proceed with cortisone injection to the right long finger A1 pulley.  She will follow up with [place] as needed.  Call with concerns or questions in the meantime.  Follow-Up Instructions: Return if symptoms worsen or fail to improve.   Orders:  Orders Placed This Encounter  Procedures  . Hand/UE Inj: R long A1   No orders of the defined types were placed in this encounter.     Procedures: Hand/UE Inj: R long A1 for trigger finger on 03/04/2020 3:54 PM Indications: pain Details: 25 G needle Medications: 1 mL lidocaine 1 %; 0.33 mL bupivacaine 0.25 %; 13.33 mg methylPREDNISolone acetate 40 MG/ML      Clinical Data: No additional findings.   Subjective: Chief Complaint  Patient presents with  . 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  She noticed this about 6 months ago and is progressively worsened.  The pain and triggering are worse in the morning as well as at night although they occur throughout the day.  She has not previously had a trigger finger nor an injection to this particular finger.  She is a diabetic.  She does have a history of Dupuytren's contracture to the left hand small finger as well as the right hand ring and small fingers.  Review of Systems as detailed in HPI.  All others reviewed and are negative.   Objective: Vital Signs: LMP 07/04/2017   Physical Exam well-developed well-nourished female in  no acute distress.  Alert and oriented x3.  Ortho Exam right hand long finger exam reveals reproducible triggering to the long finger.  She does have a tender and palpable nodule at the A1 pulley.  She does appear to have a slightly palpable cord to the palmar aspect along the long finger as well.  Specialty Comments:  No specialty comments available.  Imaging: No new imaging   PMFS History: Patient Active Problem List   Diagnosis Date Noted  . Protrusion of cervical intervertebral disc 03/22/2018  . Anemia 11/24/2017  . Pre-diabetes 11/24/2017  . Blurry vision, bilateral 05/18/2017  . Callus of foot 05/18/2017  . Pain in joint of left hip 02/16/2017  . Bruises easily 11/10/2016  . Gastroesophageal reflux disease without esophagitis 09/22/2016  . Fatigue associated with anemia 09/22/2016  . Flexion contractures 09/22/2016  . Generalized anxiety disorder 01/01/2016  . Dental abscess 01/01/2016  . Healthcare maintenance 05/08/2015  . Adjustment disorder with mixed anxiety and depressed mood 05/08/2015  . Stress at home 10/24/2014  . Irritable bowel syndrome 10/24/2014  . Anxiety state 10/24/2014  . Preop testing 09/19/2014  . Pap smear for cervical cancer screening 09/19/2014  . Midline low back pain without sciatica 08/12/2014  . [place] nodes 08/12/2014  . Contracture of joint, hand 08/12/2014  . Essential  hypertension 02/11/2014  . Allergy 02/11/2014  . Screening for breast cancer 02/11/2014  . Dupuytren's contracture of both hands 11/15/2013  . Hallux valgus of right foot 11/15/2013  . Back pain 05/21/2013  . UTI (urinary tract infection) 05/21/2013  . Multiple skin nodules 11/27/2012  . Fibromyalgia 09/08/2012  . Osteoarthritis 09/08/2012  . Neck muscle spasm 07/14/2012  . Plantar wart 07/14/2012  . Insomnia 07/14/2012   Past Medical History:  Diagnosis Date  . Anemia 11/24/2017  . Anxiety   . Bone spur    cervical spine  . Chronic kidney disease 04/2017    kidney infections  . Depression   . Deviated nasal septum    states is unable to lie flat, because her nose will become congested and she can stop breathing  . Diabetes mellitus without complication ([place])   . Dysrhythmia    heart flutters occasionally  . Fibromyalgia   . Fibromyalgia   . GERD (gastroesophageal reflux disease)   . Hallux abductovalgus with bunions 09/2014   right   . Hammertoe 09/2014   right 4th, 5th  . History of stomach ulcers   . Hyperlipidemia   . Hypertension    states is under control with med., has been on med. x 8 mos.  . IBS (irritable bowel syndrome)    no current med.  . Osteoarthritis    hands, bilateral hips  . Peripheral vascular disease (HCC)    occasional swelling in both legs  . Sinus headache     Family History  Problem Relation Age of Onset  . Hypertension Mother   . Diabetes Mother   . Heart disease Mother   . Hypertension Father   . Diabetes Father   . Prostate cancer Father   . Heart disease Father   . Alcohol abuse Father   . Hypertension Sister   . Diabetes Sister   . Heart disease Maternal Grandmother        Great GM  . Breast cancer Maternal Grandmother   . Bone cancer Maternal Grandmother   . Breast cancer Maternal Aunt   . Colon cancer Maternal Aunt   . Ovarian cancer Maternal Aunt   . Rheum arthritis Sister     Past Surgical History:  Procedure Laterality Date  . ABDOMINAL HYSTERECTOMY    . BREAST BIOPSY Left 04/2018   benign  . BREAST BIOPSY Left 2016   benign  . BUNIONECTOMY Right 10/04/2014   Procedure:  Altamese [place], Emmaline Life;  Surgeon: Trula Slade, DPM;  Location: [facility];  Service: Podiatry;  Laterality: Right;  . COLONOSCOPY WITH PROPOFOL  02/13/2013  . CYSTOSCOPY N/A 07/07/2017   Procedure: CYSTOSCOPY;  Surgeon: Aletha Halim, MD;  Location: [facility];  Service: Gynecology;  Laterality: N/A;  . DUPUYTREN / PALMAR FASCIOTOMY Right 07/18/2013   exc. of multiple nodules   . 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 HAMMER TOE SURGERY Right 10/04/2014   Procedure: HAMMER TOE REPAIR 4TH  AND 5TH  RIGHT FOOT  fourth toe fixation with k wire;  Surgeon: Trula Slade, DPM;  Location: [facility];  Service: Podiatry;  Laterality: Right;  . 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 TUBAL LIGATION    . VAGINAL HYSTERECTOMY N/A 07/07/2017   Procedure: HYSTERECTOMY VAGINAL;  Surgeon: Aletha Halim, MD;  Location: [facility];  Service: Gynecology;  Laterality: N/A;   Social History   Occupational History  .  Occupation: cook  Tobacco Use  . Smoking status: Former Smoker    Packs/day: 1.00    Years: 20.00    Pack years: 20.00    Types: Cigarettes    Quit date: 08/24/2012    Years since quitting: 7.5  . Smokeless tobacco: Never Used  Vaping Use  . Vaping Use: Some days  Substance and Sexual Activity  . Alcohol use: Yes    Comment: occasionally  . Drug use: No  . Sexual activity: Yes    Birth control/protection: Surgical

## 2020-03-07 ENCOUNTER — Ambulatory Visit (INDEPENDENT_AMBULATORY_CARE_PROVIDER_SITE_OTHER): Payer: Self-pay

## 2020-03-07 ENCOUNTER — Ambulatory Visit (INDEPENDENT_AMBULATORY_CARE_PROVIDER_SITE_OTHER): Payer: Self-pay | Admitting: Orthopaedic Surgery

## 2020-03-07 ENCOUNTER — Other Ambulatory Visit: Payer: Self-pay

## 2020-03-07 ENCOUNTER — Encounter: Payer: Self-pay | Admitting: Orthopaedic Surgery

## 2020-03-07 ENCOUNTER — Other Ambulatory Visit (HOSPITAL_COMMUNITY): Payer: Self-pay | Admitting: Orthopaedic Surgery

## 2020-03-07 VITALS — BP 137/75 | HR 69 | Ht 69.5 in | Wt 158.0 lb

## 2020-03-07 DIAGNOSIS — M545 Low back pain, unspecified: Secondary | ICD-10-CM

## 2020-03-07 DIAGNOSIS — G8929 Other chronic pain: Secondary | ICD-10-CM

## 2020-03-07 MED ORDER — PREDNISONE 5 MG (21) PO TBPK: ORAL_TABLET | ORAL | 0 refills | Status: DC

## 2020-03-07 MED FILL — predniSONE 5 MG TABS: 5 | 6 days supply | Qty: 21 | Fill #0

## 2020-03-07 NOTE — Progress Notes (Signed)
Office Visit Note   Patient: Isabel Evans           Date of Birth: [birth year]           MRN: [id number] Visit Date: 03/07/2020              Requested by: Antony Blackbird, MD [place],  [place] [address] PCP: Antony Blackbird, MD   Assessment & Plan: Visit Diagnoses:  1. Chronic bilateral low back pain, unspecified whether sciatica present     Plan: X-rays reviewed with patient.  She does have chronic changes L2-3 unchanged from previous MRI and radiographs 2019.  She likely has some disc bulge with her recent recurrent symptoms.  We will place her on a prednisone 5 mg Dosepak recheck her in several weeks.  Follow-Up Instructions: No follow-ups on file.   Orders:  Orders Placed This Encounter  Procedures  . XR Lumbar Spine 2-3 Views  . XR Pelvis 1-2 Views   No orders of the defined types were placed in this encounter.     Procedures: No procedures performed   Clinical Data: No additional findings.   Subjective: Chief Complaint  Patient presents with  . Lower Back - Pain    HPI 49 year old female seen with recurrent problems with back pain.  Previous MRI showed some disc protrusion L2-3 as well as left foraminal disc protrusion at L5-S1 in 2019.  She has had problems with who returns bilaterally previous release on the right hand with rapid recurrence and she has been told no further surgery should be done.  Patient denies chills or fever.  No associated bowel or bladder symptoms with her back pain.  Review of Systems patient had problems with disc degeneration cervical spine.  Trigger finger, lumbar disc protrusion as above.  She states she takes Metformin for diabetes.  Not on insulin.  All other systems are noncontributory to HPI.   Objective: Vital Signs: BP 137/75   Pulse 69   Ht 5' 9.5" (1.765 m)   Wt 158 lb (71.7 kg)   LMP 07/04/2017   BMI 23.00 kg/m   Physical Exam Constitutional:      Appearance: She is well-developed.  HENT:      Head: Normocephalic.     Right Ear: External ear normal.     Left Ear: External ear normal.  Eyes:     Pupils: Pupils are equal, round, and reactive to light.  Neck:     Thyroid: No thyromegaly.     Trachea: No tracheal deviation.  Cardiovascular:     Rate and Rhythm: Normal rate.  Pulmonary:     Effort: Pulmonary effort is normal.  Abdominal:     Palpations: Abdomen is soft.  Skin:    General: Skin is warm and dry.  Neurological:     Mental Status: She is alert and oriented to person, place, and time.  Psychiatric:        Behavior: Behavior normal.     Ortho Exam patient has some discomfort with static notch palpation left greater than right.  Some mild discomfort with straight leg raising 90 degrees.  Knee and ankle jerk are intact she is able to heel and toe walk.  No atrophy gastrocsoleus peroneals posterior tib are strong.  Specialty Comments:  No specialty comments available.  Imaging: CLINICAL DATA:  Lumbar radiculopathy.  EXAM: MRI LUMBAR SPINE WITHOUT CONTRAST  TECHNIQUE: Multiplanar, multisequence MR imaging of the lumbar spine was performed. No intravenous contrast was administered.  COMPARISON:  Lumbar radiographs 02/13/2018. No prior MRI for comparison.  FINDINGS: Segmentation:  Normal  Alignment:  Normal  Vertebrae:  Normal bone marrow.  Negative for fracture or mass.  Conus medullaris and cauda equina: Conus extends to the T12-L1 level. Conus and cauda equina appear normal.  Paraspinal and other soft tissues: Negative  Disc levels:  L1-2: Negative  L2-3: Right-sided disc protrusion. Subarticular and foraminal stenosis with impingement of the right L2 and L3 nerve roots. Mild spinal stenosis. Mild facet degeneration bilaterally.  L3-4: Mild facet degeneration. Negative for disc protrusion or stenosis  L4-5: Mild disc bulging. Small annular fissure on the right without focal disc protrusion. Bilateral facet hypertrophy.  Mild subarticular stenosis bilaterally  L5-S1: Small left foraminal disc protrusion. Bilateral facet degeneration. Moderate left foraminal encroachment with impingement of the left L5 nerve root.  IMPRESSION: Right foraminal disc protrusion at L2-3 with subarticular foraminal stenosis on the right  Mild subarticular stenosis bilaterally L4-5  Small left foraminal disc protrusion with moderate left foraminal encroachment at L5-S1.   Electronically Signed   By: Franchot Gallo M.D.   On: 03/15/2018 11:56   PMFS History: Patient Active Problem List   Diagnosis Date Noted  . Protrusion of cervical intervertebral disc 03/22/2018  . Anemia 11/24/2017  . Pre-diabetes 11/24/2017  . Blurry vision, bilateral 05/18/2017  . Callus of foot 05/18/2017  . Pain in joint of left hip 02/16/2017  . Bruises easily 11/10/2016  . Gastroesophageal reflux disease without esophagitis 09/22/2016  . Fatigue associated with anemia 09/22/2016  . Flexion contractures 09/22/2016  . Generalized anxiety disorder 01/01/2016  . Dental abscess 01/01/2016  . Healthcare maintenance 05/08/2015  . Adjustment disorder with mixed anxiety and depressed mood 05/08/2015  . Stress at home 10/24/2014  . Irritable bowel syndrome 10/24/2014  . Anxiety state 10/24/2014  . Preop testing 09/19/2014  . Pap smear for cervical cancer screening 09/19/2014  . Midline low back pain without sciatica 08/12/2014  . [place] nodes 08/12/2014  . Contracture of joint, hand 08/12/2014  . Essential hypertension 02/11/2014  . Allergy 02/11/2014  . Screening for breast cancer 02/11/2014  . Dupuytren's contracture of both hands 11/15/2013  . Hallux valgus of right foot 11/15/2013  . Back pain 05/21/2013  . UTI (urinary tract infection) 05/21/2013  . Multiple skin nodules 11/27/2012  . Fibromyalgia 09/08/2012  . Osteoarthritis 09/08/2012  . Neck muscle spasm 07/14/2012  . Plantar wart 07/14/2012  . Insomnia 07/14/2012    Past Medical History:  Diagnosis Date  . Anemia 11/24/2017  . Anxiety   . Bone spur    cervical spine  . Chronic kidney disease 04/2017   kidney infections  . Depression   . Deviated nasal septum    states is unable to lie flat, because her nose will become congested and she can stop breathing  . Diabetes mellitus without complication ([facility] [place])   . Dysrhythmia    heart flutters occasionally  . Fibromyalgia   . Fibromyalgia   . GERD (gastroesophageal reflux disease)   . Hallux abductovalgus with bunions 09/2014   right   . Hammertoe 09/2014   right 4th, 5th  . History of stomach ulcers   . Hyperlipidemia   . Hypertension    states is under control with med., has been on med. x 8 mos.  . IBS (irritable bowel syndrome)    no current med.  . Osteoarthritis    hands, bilateral hips  . Peripheral vascular disease ([facility])    occasional swelling  in both legs  . Sinus headache     Family History  Problem Relation Age of Onset  . Hypertension Mother   . Diabetes Mother   . Heart disease Mother   . Hypertension Father   . Diabetes Father   . Prostate cancer Father   . Heart disease Father   . Alcohol abuse Father   . Hypertension Sister   . Diabetes Sister   . Heart disease Maternal Grandmother        Great GM  . Breast cancer Maternal Grandmother   . Bone cancer Maternal Grandmother   . Breast cancer Maternal Aunt   . Colon cancer Maternal Aunt   . Ovarian cancer Maternal Aunt   . Rheum arthritis Sister     Past Surgical History:  Procedure Laterality Date  . ABDOMINAL HYSTERECTOMY    . BREAST BIOPSY Left 04/2018   benign  . BREAST BIOPSY Left 2016   benign  . BUNIONECTOMY Right 10/04/2014   Procedure:  [place], [place];  Surgeon: Trula Slade, DPM;  Location: [facility];  Service: Podiatry;  Laterality: Right;  . COLONOSCOPY WITH PROPOFOL  02/13/2013  . CYSTOSCOPY N/A 07/07/2017   Procedure: CYSTOSCOPY;  Surgeon: Aletha Halim, MD;  Location: [facility];  Service: Gynecology;  Laterality: N/A;  . DUPUYTREN / PALMAR FASCIOTOMY Right 07/18/2013   exc. of multiple nodules  . 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 HAMMER TOE SURGERY Right 10/04/2014   Procedure: HAMMER TOE REPAIR 4TH  AND 5TH  RIGHT FOOT  fourth toe fixation with k wire;  Surgeon: Trula Slade, DPM;  Location: [facility];  Service: Podiatry;  Laterality: Right;  . 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 TUBAL LIGATION    . VAGINAL HYSTERECTOMY N/A 07/07/2017   Procedure: HYSTERECTOMY VAGINAL;  Surgeon: Aletha Halim, MD;  Location: [facility];  Service: Gynecology;  Laterality: N/A;   Social History   Occupational History  . Occupation: cook  Tobacco Use  . Smoking status: Former Smoker    Packs/day: 1.00    Years: 20.00    Pack years: 20.00    Types: Cigarettes    Quit date: 08/24/2012    Years since quitting: 7.5  . Smokeless tobacco: Never Used  Vaping Use  . Vaping Use: Some days  Substance and Sexual Activity  . Alcohol use: Yes    Comment: occasionally  . Drug use: No  . Sexual activity: Yes    Birth control/protection: Surgical

## 2020-03-17 ENCOUNTER — Other Ambulatory Visit: Payer: Self-pay | Admitting: Family Medicine

## 2020-03-17 ENCOUNTER — Other Ambulatory Visit: Payer: Self-pay | Admitting: Family

## 2020-03-17 DIAGNOSIS — M542 Cervicalgia: Secondary | ICD-10-CM

## 2020-03-17 DIAGNOSIS — K219 Gastro-esophageal reflux disease without esophagitis: Secondary | ICD-10-CM

## 2020-03-17 DIAGNOSIS — I10 Essential (primary) hypertension: Secondary | ICD-10-CM

## 2020-03-17 DIAGNOSIS — R7303 Prediabetes: Secondary | ICD-10-CM

## 2020-03-17 DIAGNOSIS — M2550 Pain in unspecified joint: Secondary | ICD-10-CM

## 2020-03-17 DIAGNOSIS — F411 Generalized anxiety disorder: Secondary | ICD-10-CM

## 2020-03-17 MED FILL — ROSUVASTATIN CALCIUM 20 MG: 20 | 30 days supply | Qty: 30 | Fill #2

## 2020-03-17 MED FILL — GABAPENTIN 300 MG CAPSULE: 300 | 30 days supply | Qty: 90 | Fill #0

## 2020-03-17 MED FILL — ONDANSETRON ODT 8 MG TABLET: 8 | 20 days supply | Qty: 20 | Fill #1

## 2020-03-17 MED FILL — tiZANidine HCL 4 MG TABS: 4 | 30 days supply | Qty: 90 | Fill #2

## 2020-03-17 MED FILL — traMADol HCL 50 MG TABS: 50 | 30 days supply | Qty: 90 | Fill #2

## 2020-03-17 MED FILL — DICYCLOMINE 10 MG CAPSULE: 10 | 15 days supply | Qty: 60 | Fill #2

## 2020-03-17 MED FILL — ?OMEPRAZOLE 20 MG CPDR: 20 | 30 days supply | Qty: 60 | Fill #0

## 2020-03-17 MED FILL — ?DULOXETINE HCL 20MG CPE: 20 | 30 days supply | Qty: 30 | Fill #1

## 2020-03-17 MED FILL — FLUTICASONE PROP 50 MCG SPR: 50 | 20 days supply | Qty: 16 | Fill #2

## 2020-03-17 NOTE — Telephone Encounter (Signed)
Notes to clinic: Patient was last seen by Cammie Fulp  Please review for refill    Requested Prescriptions  Pending Prescriptions Disp Refills   amLODipine (NORVASC) 10 MG tablet [Pharmacy Med Name: AMLODIPINE BESYLATE 10 MG T 10 Tablet] 30 tablet 0    Sig: TAKE 1 TABLET (10 MG TOTAL) BY MOUTH DAILY.      Cardiovascular:  Calcium Channel Blockers Passed - 03/17/2020 11:25 AM      Passed - Last BP in normal range    BP Readings from Last 1 Encounters:  03/07/20 137/75          Passed - Valid encounter within last 6 months    Recent Outpatient Visits           2 weeks ago Neck pain   [place], [place], MD   2 months ago Abnormal weight loss   [place], MD   6 months ago Change in bowel habits   [place], MD   8 months ago Essential hypertension   [place], [place], NP   11 months ago COVID-19 virus detected   [place], [place], [place]       Future Appointments             In 3 months Fulp, Cammie, MD [place]              diclofenac (VOLTAREN) 50 MG EC tablet [Pharmacy Med Name: DICLOFENAC SOD EC 50 MG TAB 50 Tablet] 60 tablet 0    Sig: TAKE 1 TABLET (50 MG TOTAL) BY MOUTH 2 (TWO) TIMES DAILY. AFTER A MEAL AS NEEDED FOR PAIN      Analgesics:  NSAIDS Passed - 03/17/2020 11:25 AM      Passed - Cr in normal range and within 360 days    Creat  Date Value Ref Range Status  11/27/2015 0.75 0.50 - 1.10 mg/dL Final   Creatinine, Ser  Date Value Ref Range Status  01/04/2020 0.70 0.57 - 1.00 mg/dL Final          Passed - HGB in normal range and within 360 days    Hemoglobin  Date Value Ref Range Status  01/04/2020 13.2 11.1 - 15.9 g/dL Final          Passed - Patient is not pregnant      Passed - Valid encounter within  last 12 months    Recent Outpatient Visits           2 weeks ago Neck pain   [place] Fulp, [place], MD   2 months ago Abnormal weight loss   [place], MD   6 months ago Change in bowel habits   [place], MD   8 months ago Essential hypertension   [place], [place], NP   11 months ago COVID-19 virus detected   [place], Dionne Bucy, [place]       Future Appointments             In 3 months Antony Blackbird, MD [place]

## 2020-03-17 NOTE — Telephone Encounter (Signed)
Requested Prescriptions  Pending Prescriptions Disp Refills   venlafaxine XR (EFFEXOR-XR) 150 MG 24 hr capsule [Pharmacy Med Name: VENLAFAXINE HCL ER 150 MG C 150 Capsule] 90 capsule 0    Sig: TAKE 1 CAPSULE BY MOUTH DAILY WITH BREAKFAST.     Psychiatry: Antidepressants - SNRI - desvenlafaxine & venlafaxine Failed - 03/17/2020 11:21 AM      Failed - LDL in normal range and within 360 days    LDL Chol Calc (NIH)  Date Value Ref Range Status  08/24/2019 62 0 - 99 mg/dL Final         Passed - Total Cholesterol in normal range and within 360 days    Cholesterol, Total  Date Value Ref Range Status  08/24/2019 147 100 - 199 mg/dL Final         Passed - Triglycerides in normal range and within 360 days    Triglycerides  Date Value Ref Range Status  08/24/2019 87 0 - 149 mg/dL Final         Passed - Last BP in normal range    BP Readings from Last 1 Encounters:  03/07/20 137/75         Passed - Valid encounter within last 6 months    Recent Outpatient Visits          2 weeks ago Neck pain   [place], [place], MD   2 months ago Abnormal weight loss   [place], [place], MD   6 months ago Change in bowel habits   [place], MD   8 months ago Essential hypertension   [place], [place], NP   11 months ago COVID-19 virus detected   [place], Dionne Bucy, [place]      Future Appointments            In 3 months Antony Blackbird, MD [place]            metFORMIN (GLUCOPHAGE) 500 MG tablet [Pharmacy Med Name: METFORMIN HCL $RemoveBefor'500MG'[id number]$  TABL 500 Tablet] 90 tablet 0    Sig: TAKE 1 TABLET (500 MG TOTAL) BY MOUTH DAILY WITH BREAKFAST.     Endocrinology:  Diabetes - Biguanides Passed - 03/17/2020 11:21 AM      Passed - Cr in normal range and within 360 days    Creat   Date Value Ref Range Status  11/27/2015 0.75 0.50 - 1.10 mg/dL Final   Creatinine, Ser  Date Value Ref Range Status  01/04/2020 0.70 0.57 - 1.00 mg/dL Final         Passed - HBA1C is between 0 and 7.9 and within 180 days    Hgb A1c MFr Bld  Date Value Ref Range Status  01/04/2020 5.3 4.8 - 5.6 % Final    Comment:             Prediabetes: 5.7 - 6.4          Diabetes: >6.4          Glycemic control for adults with diabetes: <7.0          Passed - eGFR in normal range and within 360 days    GFR calc Af Amer  Date Value Ref Range Status  01/04/2020 118 >59 mL/min/1.73 Final    Comment:    **[facility] currently reports eGFR in compliance  with the current**   recommendations of the [employer]. [facility] will   update reporting as new guidelines are published from the NKF-ASN   Task force.    GFR calc non Af Amer  Date Value Ref Range Status  01/04/2020 103 >59 mL/min/1.73 Final         Passed - Valid encounter within last 6 months    Recent Outpatient Visits          2 weeks ago Neck pain   [place] Fulp, [place], MD   2 months ago Abnormal weight loss   [place], MD   6 months ago Change in bowel habits   [place], MD   8 months ago Essential hypertension   [place], [place], NP   11 months ago COVID-19 virus detected   [place], Dionne Bucy, [place]      Future Appointments            In 3 months Antony Blackbird, MD [place]

## 2020-03-18 ENCOUNTER — Other Ambulatory Visit: Payer: Self-pay | Admitting: Family Medicine

## 2020-03-18 ENCOUNTER — Telehealth: Payer: Self-pay | Admitting: Family Medicine

## 2020-03-18 ENCOUNTER — Other Ambulatory Visit: Payer: Self-pay | Admitting: Internal Medicine

## 2020-03-18 DIAGNOSIS — F411 Generalized anxiety disorder: Secondary | ICD-10-CM

## 2020-03-18 MED FILL — METFORMIN HCL 500 MG TABS: 500 | 30 days supply | Qty: 30 | Fill #0

## 2020-03-18 MED FILL — VENLAFAXINE HCL ER 150 MG C: 150 | 30 days supply | Qty: 30 | Fill #0

## 2020-03-18 MED FILL — DICLOFENAC SOD EC 50 MG TAB: 50 | 30 days supply | Qty: 60 | Fill #0

## 2020-03-18 NOTE — Telephone Encounter (Signed)
Pt request refill  clonazePAM (KLONOPIN) 0.5 MG tablet  Pt states she just saw Dr Chapman Fitch 11/11 and forgot to tell her she needs this med. Pt called 3 days ago. Pt has to go out of town in the morning, family emergency and she needs this med.  [place] [place] [employer] Phone:  [phone]  Fax:  [phone]

## 2020-03-18 NOTE — Telephone Encounter (Signed)
I cannot approve this request since it is a controlled substance. Will forward to provider covering for Dr. Chapman Fitch.

## 2020-03-19 ENCOUNTER — Telehealth: Payer: Self-pay | Admitting: Family Medicine

## 2020-03-19 NOTE — Telephone Encounter (Addendum)
Unfortunately benzodiazepines are not recommended for long-term management of anxiety .  I am happy to refer her to psych if she needs to remain on it.

## 2020-03-19 NOTE — Telephone Encounter (Signed)
Called PT to notify Medication Refill for Clonazepam was denied by Dr. Margarita Rana. Pt needs to follow up with [facility] Referral is Dr. Smitty Pluck recommendation. Please advise and thank you.

## 2020-03-20 NOTE — Telephone Encounter (Signed)
I cannot assist with this request further. She has been instructed to contact behavioral health.

## 2020-03-20 NOTE — Telephone Encounter (Signed)
Pt is very upset regarding this message. She is requesting to have someone give her a call back to discuss this. Please advise.

## 2020-03-24 ENCOUNTER — Other Ambulatory Visit: Payer: Self-pay | Admitting: Physician Assistant

## 2020-03-24 ENCOUNTER — Encounter: Payer: Self-pay | Admitting: Nurse Practitioner

## 2020-03-24 ENCOUNTER — Ambulatory Visit: Payer: Self-pay | Attending: Physician Assistant | Admitting: Physician Assistant

## 2020-03-24 ENCOUNTER — Encounter: Payer: Self-pay | Admitting: Physician Assistant

## 2020-03-24 ENCOUNTER — Other Ambulatory Visit (INDEPENDENT_AMBULATORY_CARE_PROVIDER_SITE_OTHER): Payer: Self-pay

## 2020-03-24 ENCOUNTER — Ambulatory Visit (INDEPENDENT_AMBULATORY_CARE_PROVIDER_SITE_OTHER): Payer: Self-pay | Admitting: Nurse Practitioner

## 2020-03-24 ENCOUNTER — Ambulatory Visit: Payer: Self-pay | Admitting: Physician Assistant

## 2020-03-24 ENCOUNTER — Other Ambulatory Visit: Payer: Self-pay

## 2020-03-24 VITALS — BP 118/78 | HR 79 | Ht 69.5 in | Wt 157.0 lb

## 2020-03-24 DIAGNOSIS — R194 Change in bowel habit: Secondary | ICD-10-CM

## 2020-03-24 DIAGNOSIS — R634 Abnormal weight loss: Secondary | ICD-10-CM

## 2020-03-24 DIAGNOSIS — R197 Diarrhea, unspecified: Secondary | ICD-10-CM

## 2020-03-24 DIAGNOSIS — K5900 Constipation, unspecified: Secondary | ICD-10-CM

## 2020-03-24 DIAGNOSIS — K219 Gastro-esophageal reflux disease without esophagitis: Secondary | ICD-10-CM

## 2020-03-24 DIAGNOSIS — F411 Generalized anxiety disorder: Secondary | ICD-10-CM

## 2020-03-24 LAB — COMPREHENSIVE METABOLIC PANEL
ALT: 12 U/L (ref 0–35)
AST: 12 U/L (ref 0–37)
Albumin: 4.5 g/dL (ref 3.5–5.2)
Alkaline Phosphatase: 54 U/L (ref 39–117)
BUN: 7 mg/dL (ref 6–23)
CO2: 31 mEq/L (ref 19–32)
Calcium: 9.6 mg/dL (ref 8.4–10.5)
Chloride: 100 mEq/L (ref 96–112)
Creatinine, Ser: 0.78 mg/dL (ref 0.40–1.20)
GFR: 89.57 mL/min (ref >60.00)
Glucose, Bld: 78 mg/dL (ref 70–99)
Potassium: 3.9 mEq/L (ref 3.5–5.1)
Sodium: 139 mEq/L (ref 135–145)
Total Bilirubin: 0.3 mg/dL (ref 0.2–1.2)
Total Protein: 7.3 g/dL (ref 6.0–8.3)

## 2020-03-24 LAB — CBC WITH DIFFERENTIAL/PLATELET
Basophils Absolute: 0 10*3/uL (ref 0.0–0.1)
Basophils Relative: 0.6 % (ref 0.0–3.0)
Eosinophils Absolute: 0.1 10*3/uL (ref 0.0–0.7)
Eosinophils Relative: 1.6 % (ref 0.0–5.0)
HCT: 39.2 % (ref 36.0–46.0)
Hemoglobin: 13.2 g/dL (ref 12.0–15.0)
Lymphocytes Relative: 34.2 % (ref 12.0–46.0)
Lymphs Abs: 2.3 10*3/uL (ref 0.7–4.0)
MCHC: 33.7 g/dL (ref 30.0–36.0)
MCV: 90.6 fl (ref 78.0–100.0)
Monocytes Absolute: 0.5 10*3/uL (ref 0.1–1.0)
Monocytes Relative: 7.6 % (ref 3.0–12.0)
Neutro Abs: 3.7 10*3/uL (ref 1.4–7.7)
Neutrophils Relative %: 56 % (ref 43.0–77.0)
Platelets: 229 10*3/uL (ref 150.0–400.0)
RBC: 4.32 Mil/uL (ref 3.87–5.11)
RDW: 12.7 % (ref 11.5–15.5)
WBC: 6.7 10*3/uL (ref 4.0–10.5)

## 2020-03-24 LAB — C-REACTIVE PROTEIN: CRP: <1 mg/dL (ref 0.5–20.0)

## 2020-03-24 MED ORDER — CLONAZEPAM 0.5 MG PO TABS: ORAL_TABLET | ORAL | 1 refills | Status: DC

## 2020-03-24 MED ORDER — CLENPIQ 10-3.5-12 MG-GM -GM/160ML PO SOLN: 1.0000 | Freq: Once | ORAL | 0 refills | Status: AC

## 2020-03-24 MED FILL — clonazePAM 0.5 MG TABS: 0.5 | 15 days supply | Qty: 30 | Fill #0

## 2020-03-24 NOTE — Progress Notes (Signed)
03/24/2020 Isabel Evans [id number] 1970-09-30   CHIEF COMPLAINT: Change in bowel pattern, weight loss  HISTORY OF PRESENT ILLNESS: Isabel Evans is a 49 year old female with a past medical history of anxiety, depression, fibromyalgia, hypertension, hyperlipidemia, diabetes mellitus type 2, chronic lower back pain and GERD.  She presents to our office today for further evaluation regarding a change in bowel pattern and weight loss.  Approximately 8 months ago, she noticed her stool was flat on one side and the diameter of her stool was more narrow.  Since that time, she describes having alternating constipation and diarrhea.  She is also concerned as she sees pinpoint white specks in her stools.  Her stools are not oily and do not float.  She reported having 3 episodes of fecal incontinence at nighttime when she soiled her bed which was quite undesirable.  She stated she awakened with severe urgency to pass a BM but had no control which resulted in soiling her bed.  Most days she has a loose, semisolid or solid stool on the same day.  No abdominal pain.  She has some nausea.  No vomiting.  She reported losing 10 pounds with diet changes 8 months ago.  She drank less soda, ate smaller portions and reduced her bread intake.  However, she continued to lose 40 more pounds over the past 6 months without further diet changes.  She reports having night sweats once or twice weekly for the past 6 months.  She takes Omeprazole 20 mg 2 capsules once daily for history of GERD.  She has occasional heartburn, no dysphagia.  She reported having 2 bleeding ulcers 25 years ago which required hospital admission.  At that time, she reported her stomach ulcers were attributed to high stress level, drinking a lot of coffee and taking Goody powder.  She denies NSAID use at this time.  Great maternal aunt with history of colon cancer.  She underwent a colonoscopy by Dr. Deatra Ina 02/13/2013 which was normal.  A repeat  colonoscopy in 10 years was recommended.  She reports having a history of IBS symptoms, she took probiotics 2 years ago which were ineffective.  She denies taking any probiotics or fiber supplement at this time.  No recent antibiotics.  Past Medical History:  Diagnosis Date  . Anemia 11/24/2017  . Anxiety   . Bone spur    cervical spine  . Chronic kidney disease 04/2017   kidney infections  . Depression   . Deviated nasal septum    states is unable to lie flat, because her nose will become congested and she can stop breathing  . Diabetes mellitus without complication ([place])   . Dysrhythmia    heart flutters occasionally  . Fibromyalgia   . Fibromyalgia   . GERD (gastroesophageal reflux disease)   . Hallux abductovalgus with bunions 09/2014   right   . Hammertoe 09/2014   right 4th, 5th  . History of stomach ulcers   . Hyperlipidemia   . Hypertension    states is under control with med., has been on med. x 8 mos.  . IBS (irritable bowel syndrome)    no current med.  . Osteoarthritis    hands, bilateral hips  . Peripheral vascular disease (HCC)    occasional swelling in both legs  . Sinus headache    Past Surgical History:  Procedure Laterality Date  . ABDOMINAL HYSTERECTOMY    . BREAST BIOPSY Left 04/2018   benign  . BREAST  BIOPSY Left 2016   benign  . BUNIONECTOMY Right 10/04/2014   Procedure:  Altamese [place], Emmaline Life;  Surgeon: Trula Slade, DPM;  Location: [facility];  Service: Podiatry;  Laterality: Right;  . COLONOSCOPY WITH PROPOFOL  02/13/2013  . CYSTOSCOPY N/A 07/07/2017   Procedure: CYSTOSCOPY;  Surgeon: Aletha Halim, MD;  Location: [facility];  Service: Gynecology;  Laterality: N/A;  . DUPUYTREN / PALMAR FASCIOTOMY Right 07/18/2013   exc. of multiple nodules  . 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 HAMMER TOE SURGERY Right 10/04/2014   Procedure: HAMMER TOE REPAIR 4TH  AND 5TH  RIGHT FOOT  fourth toe  fixation with k wire;  Surgeon: Trula Slade, DPM;  Location: [facility];  Service: Podiatry;  Laterality: Right;  . 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 TUBAL LIGATION    . VAGINAL HYSTERECTOMY N/A 07/07/2017   Procedure: HYSTERECTOMY VAGINAL;  Surgeon: Aletha Halim, MD;  Location: [facility];  Service: Gynecology;  Laterality: N/A;   Social History:  She smoked cigarettes 2ppd x 4 to 5 years. She quit smoking 5 years ago. She drinks 2 to 3 beers weekly. Smokes marijuana.   Family History: Mother age 88 HTN, heart problems, DM on insulin. Father died age 81 prostate cancer, alcoholism, HTN and heart disease, DM and depression. Maternal aunt with breast and ovarian cancer.  Great maternal aunt with history of colon cancer.  Sister with DM and rheumatoid arthritis.   No Known Allergies   Outpatient Encounter Medications as of 03/24/2020  Medication Sig  . clonazePAM (KLONOPIN) 0.5 MG tablet TAKE 1/2 TABLET BY MOUTH TWICE DAILY AS NEEDED FOR ANXIETY  . diclofenac (VOLTAREN) 50 MG EC tablet TAKE 1 TABLET (50 MG TOTAL) BY MOUTH 2 (TWO) TIMES DAILY. AFTER A MEAL AS NEEDED FOR PAIN  . dicyclomine (BENTYL) 10 MG capsule TAKE 1 CAPSULE BY MOUTH 4 TIMES DAILY AS NEEDED FOR SPASMS.  Marland Kitchen diphenhydrAMINE (BENADRYL) 25 MG tablet Take 25 mg by mouth daily as needed for allergies.  . DULoxetine (CYMBALTA) 20 MG capsule TAKE 1 CAPSULE BY MOUTH DAILY.  . fluticasone (FLONASE) 50 MCG/ACT nasal spray PLACE 2 SPRAYS INTO BOTH NOSTRILS DAILY.  Marland Kitchen gabapentin (NEURONTIN) 300 MG capsule TAKE 1 CAPSULE BY MOUTH 2 TIMES DAILY AND 2 CAPSULES AT BEDTIME.  . metFORMIN (GLUCOPHAGE) 500 MG tablet TAKE 1 TABLET (500 MG TOTAL) BY MOUTH DAILY WITH BREAKFAST.  Marland Kitchen omeprazole (PRILOSEC) 20 MG capsule TAKE 2 CAPSULES (40 MG TOTAL) BY MOUTH DAILY.  Marland Kitchen ondansetron (ZOFRAN-ODT) 8 MG disintegrating tablet TAKE 1 TABLET (8 MG TOTAL) BY MOUTH DAILY AS NEEDED FOR NAUSEA OR VOMITING.  . predniSONE  (STERAPRED UNI-PAK 21 TAB) 5 MG (21) TBPK tablet Take with food, daily 6 tablets then 5 , then 4,3,2,1 stop  . rosuvastatin (CRESTOR) 20 MG tablet Take 1 tablet (20 mg total) by mouth daily. To lower cholesterol  . tiZANidine (ZANAFLEX) 4 MG tablet Take 1 tablet (4 mg total) by mouth every 8 (eight) hours as needed for muscle spasms. Start with 1/2 pill (2 mg) as patient can cause drowsiness  . traMADol (ULTRAM) 50 MG tablet TAKE 1 TABLET (50 MG TOTAL) BY MOUTH 3 (THREE) TIMES DAILY. AS NEEDED FOR PAIN. (30 DAY SUPPLY)  . venlafaxine XR (EFFEXOR-XR) 150 MG 24 hr capsule TAKE 1 CAPSULE BY MOUTH DAILY WITH BREAKFAST.   No facility-administered encounter medications on file as of  03/24/2020.     REVIEW OF SYSTEMS:  Gen: See HPI. + Fatigue.  CV: Denies chest pain, palpitations or edema. Resp: Denies cough, shortness of breath of hemoptysis.  GI: See HPI. GU : Denies urinary burning, blood in urine, increased urinary frequency or incontinence. MS: + Back pain. Derm: Denies rash, itchiness, skin lesions or unhealing ulcers. Psych: + Anxiety and depression. Heme: Denies bruising, bleeding. Neuro:  Denies headaches, dizziness or paresthesias. Endo:  Denies any problems with DM, thyroid or adrenal function.    PHYSICAL EXAM: LMP 07/04/2017   BP 118/78   Pulse 79   Ht 5' 9.5" (1.765 m)   Wt 157 lb (71.2 kg)   LMP 07/04/2017   BMI 22.85 kg/m  General: 49 year old female in no acute distress. Head: Normocephalic and atraumatic. Eyes:  Sclerae non-icteric, conjunctive pink. Ears: Normal auditory acuity. Mouth: Dentition intact. No ulcers or lesions.  Neck: Supple, no lymphadenopathy or thyromegaly.  Lungs: Clear bilaterally to auscultation without wheezes, crackles or rhonchi. Heart: Regular rate and rhythm. No murmur, rub or gallop appreciated.  Abdomen: Soft, nontender, non distended. No masses. No hepatosplenomegaly. Normoactive bowel sounds x 4 quadrants.  Rectal:  Deferred. Musculoskeletal: Symmetrical with no gross deformities. Skin: Warm and dry. No rash or lesions on visible extremities. Extremities: No edema. Neurological: Alert oriented x 4, no focal deficits.  Psychological:  Alert and cooperative. Normal mood and affect.  ASSESSMENT AND PLAN:  66.  49 year old female with change in bowel pattern, narrow stools with alternating diarrhea and constipation x 8 months.  Normal colonoscopy in 2014. -Benefiber 1 tablespoon daily if tolerated -Colonoscopy benefits and risks discussed including risk with sedation, risk of bleeding, perforation and infection  -TTG, IGA, CRP, CBC, CMP  2.  GERD.  Nausea without vomiting.  History of bleeding gastric ulcers 25 years ago. -Continue Omeprazole 40 mg daily for now - EGD benefits and risks discussed including risk with sedation, risk of bleeding, perforation and infection   3.  Unexplained 40 pound weight loss in the past 6 months. Night sweats past 6 months.  -EGD and colonoscopy as ordered above -Follow-up with PCP to discuss chest CT to rule out pulmonary malignancy (past smoker) -Consider abdominal/pelvic CT if EGD and colonoscopy negative -Labs as ordered above and pancreatic elastase level  Further recommendations to be determined after the above evaluation completed    CC:  Fulp, Cammie, MD

## 2020-03-24 NOTE — Progress Notes (Signed)
Virtual Visit via Telephone Note  I connected with Isabel Evans on 03/24/20 at  2:30 PM EST by telephone and verified that I am speaking with the correct person using two identifiers.  Location: Patient: Isabel Evans Provider: Freeman Caldron, PA-C   I discussed the limitations, risks, security and privacy concerns of performing an evaluation and management service by telephone and the availability of in person appointments. I also discussed with the patient that there may be a patient responsible charge related to this service. The patient expressed understanding and agreed to proceed.  PATIENT visit by telephone virtually in the context of Covid-19 pandemic. Patient location:  home My Location:  [facility] office Persons on the call:  Me and the patient.  Screened by Elmon Else   History of Present Illness:  Patient has been on long term Clonazepam and previously prescribed here by Dr Doreene Burke then Dr Chapman Fitch until recently.  Explained to patient that this office no longer prescribes benzos in general for long-term use.  She is open to seeing Psychiatry but needs some to get her through until appt.    Denies SI/HI.      Observations/Objective:  NAD.  A&Ox3   Assessment and Plan: 1. Anxiety state - Ambulatory referral to Psychiatry  2. Generalized anxiety disorder Due to risk of seizure/WD, I will prescribe her some to use sparingly until she can see psychiatry.   - Ambulatory referral to Psychiatry - clonazePAM (KLONOPIN) 0.5 MG tablet; Take 1 bid prn anxiety.  Use sparingly  Dispense: 30 tablet; Refill: 1  Follow Up Instructions: Assign PCP in 2 months   I discussed the assessment and treatment plan with the patient. The patient was provided an opportunity to ask questions and all were answered. The patient agreed with the plan and demonstrated an understanding of the instructions.   The patient was advised to call back or seek an in-person evaluation if the symptoms worsen  or if the condition fails to improve as anticipated.  I provided 12 minutes of non-face-to-face time during this encounter.   Freeman Caldron, PA-C  Patient ID: Isabel Evans, female   DOB: 02-Mar-1971, 49 y.o.   MRN: [id number]

## 2020-03-24 NOTE — Patient Instructions (Signed)
If you are age 48 or older, your body mass index should be between 23-30. Your Body mass index is 22.85 kg/m. If this is out of the aforementioned range listed, please consider follow up with your Primary Care Provider.  If you are age 30 or younger, your body mass index should be between 19-25. Your Body mass index is 22.85 kg/m. If this is out of the aformentioned range listed, please consider follow up with your Primary Care Provider.   Your provider has requested that you go to the basement level for lab work before leaving today. Press "B" on the elevator. The lab is located at the first door on the left as you exit the elevator.   Start Benefiber 1 tablespoon daily.  Further follow up to be determined after procedure.

## 2020-03-24 NOTE — Progress Notes (Signed)
Needs clonazepam refill

## 2020-03-25 ENCOUNTER — Telehealth: Payer: Self-pay | Admitting: Nurse Practitioner

## 2020-03-25 ENCOUNTER — Other Ambulatory Visit: Payer: Self-pay | Admitting: Nurse Practitioner

## 2020-03-25 LAB — IGA: Immunoglobulin A: 177 mg/dL (ref 47–310)

## 2020-03-25 LAB — TISSUE TRANSGLUTAMINASE ABS,IGG,IGA
(tTG) Ab, IgA: <1 U/mL
(tTG) Ab, IgG: <1 U/mL

## 2020-03-25 MED FILL — POLYETHYLENE GLYCOL 3350 PO: 17 | 1 days supply | Qty: 476 | Fill #0

## 2020-03-25 NOTE — Telephone Encounter (Signed)
[facility],Spoke with Claiborne Billings- they are unable to provide the patient with a prep that is not to expensive. Gave them a verbal for miralax 238 g bottle x2 and  (4) 5 mg Doculax tablets.

## 2020-03-26 ENCOUNTER — Encounter: Payer: Self-pay | Admitting: General Surgery

## 2020-03-27 ENCOUNTER — Other Ambulatory Visit: Payer: Self-pay | Admitting: Family Medicine

## 2020-03-27 DIAGNOSIS — I10 Essential (primary) hypertension: Secondary | ICD-10-CM

## 2020-03-27 NOTE — Progress Notes (Signed)
Agree with the assessment and plan as outlined by Colleen Kennedy-Smith, NP.   Castin Donaghue, DO, FACG [facility]   

## 2020-04-01 ENCOUNTER — Other Ambulatory Visit: Payer: Self-pay

## 2020-04-01 ENCOUNTER — Ambulatory Visit (INDEPENDENT_AMBULATORY_CARE_PROVIDER_SITE_OTHER): Payer: Self-pay | Admitting: Orthopaedic Surgery

## 2020-04-01 ENCOUNTER — Encounter: Payer: Self-pay | Admitting: Orthopaedic Surgery

## 2020-04-01 VITALS — BP 120/77 | HR 70

## 2020-04-01 DIAGNOSIS — R197 Diarrhea, unspecified: Secondary | ICD-10-CM

## 2020-04-01 DIAGNOSIS — M5020 Other cervical disc displacement, unspecified cervical region: Secondary | ICD-10-CM

## 2020-04-01 DIAGNOSIS — R194 Change in bowel habit: Secondary | ICD-10-CM

## 2020-04-01 DIAGNOSIS — M545 Low back pain, unspecified: Secondary | ICD-10-CM

## 2020-04-01 DIAGNOSIS — R634 Abnormal weight loss: Secondary | ICD-10-CM

## 2020-04-01 DIAGNOSIS — K5900 Constipation, unspecified: Secondary | ICD-10-CM

## 2020-04-01 DIAGNOSIS — G8929 Other chronic pain: Secondary | ICD-10-CM

## 2020-04-01 DIAGNOSIS — K219 Gastro-esophageal reflux disease without esophagitis: Secondary | ICD-10-CM

## 2020-04-01 NOTE — Progress Notes (Signed)
Office Visit Note   Patient: Isabel Evans           Date of Birth: 1970-10-10           MRN: [id number] Visit Date: 04/01/2020              Requested by: No referring provider defined for this encounter. PCP: Patient, No Pcp Per   Assessment & Plan: Visit Diagnoses:  1. Protrusion of cervical intervertebral disc   2. Chronic midline low back pain without sciatica     Plan: We discussed at this point she is unlikely get relief with the cortisone injection if oral medication Dosepak is not been effective.  Scan was reviewed with her again.  We discussed walking program stretching program.  Patient's been taking clonazepam chronically as well as tramadol 90 a month.  No change in medications.  Follow-Up Instructions: Return if symptoms worsen or fail to improve.   Orders:  No orders of the defined types were placed in this encounter.  No orders of the defined types were placed in this encounter.     Procedures: No procedures performed   Clinical Data: No additional findings.   Subjective: Chief Complaint  Patient presents with  . Neck - Pain  . Lower Back - Pain    HPI 49 year old female returns states she is having ongoing problems with neck pain but more back pain.  She took the prednisone 5 mg Dosepak and did not feel like she got any sustained relief.  Previous MRI showed right foraminal disc protrusion L2-3 and mild subarticular narrowing at L4-5.  Small left foraminal disc protrusion at L5-S1.  Patient diabetic on Metformin.  No fever chills no associated bowel bladder symptoms.  Last A1c was 5.3.  Review of Systems all other systems update unchanged from 03/07/2020 office visit.   Objective: Vital Signs: BP 120/77   Pulse 70   LMP 07/04/2017   Physical Exam Constitutional:      Appearance: She is well-developed.  HENT:     Head: Normocephalic.     Right Ear: External ear normal.     Left Ear: External ear normal.  Eyes:     Pupils: Pupils are  equal, round, and reactive to light.  Neck:     Thyroid: No thyromegaly.     Trachea: No tracheal deviation.  Cardiovascular:     Rate and Rhythm: Normal rate.  Pulmonary:     Effort: Pulmonary effort is normal.  Abdominal:     Palpations: Abdomen is soft.  Skin:    General: Skin is warm and dry.  Neurological:     Mental Status: She is alert and oriented to person, place, and time.  Psychiatric:        Mood and Affect: Mood and affect normal.        Behavior: Behavior normal.     Ortho Exam patient is tenderness palpation lumbosacral junction.  Negative Spurling.  Some tenderness palpation of the trapezial.  Upper lower extremity reflexes are 2+ and symmetrical.  Quad strength is strong and symmetrical.  She is able to heel and toe walk.  No peroneal or gastroc weakness sensation of the foot all dermatomes are normal right and left.  Specialty Comments:  No specialty comments available.  Imaging: No results found.   PMFS History: Patient Active Problem List   Diagnosis Date Noted  . Protrusion of cervical intervertebral disc 03/22/2018  . Anemia 11/24/2017  . Pre-diabetes 11/24/2017  . Blurry vision,  bilateral 05/18/2017  . Callus of foot 05/18/2017  . Pain in joint of left hip 02/16/2017  . Bruises easily 11/10/2016  . Gastroesophageal reflux disease without esophagitis 09/22/2016  . Fatigue associated with anemia 09/22/2016  . Flexion contractures 09/22/2016  . Generalized anxiety disorder 01/01/2016  . Dental abscess 01/01/2016  . Healthcare maintenance 05/08/2015  . Adjustment disorder with mixed anxiety and depressed mood 05/08/2015  . Stress at home 10/24/2014  . Irritable bowel syndrome 10/24/2014  . Anxiety state 10/24/2014  . Preop testing 09/19/2014  . Pap smear for cervical cancer screening 09/19/2014  . Midline low back pain without sciatica 08/12/2014  . [place] nodes 08/12/2014  . Contracture of joint, hand 08/12/2014  . Essential hypertension  02/11/2014  . Allergy 02/11/2014  . Screening for breast cancer 02/11/2014  . Dupuytren's contracture of both hands 11/15/2013  . Hallux valgus of right foot 11/15/2013  . Back pain 05/21/2013  . UTI (urinary tract infection) 05/21/2013  . Multiple skin nodules 11/27/2012  . Fibromyalgia 09/08/2012  . Osteoarthritis 09/08/2012  . Neck muscle spasm 07/14/2012  . Plantar wart 07/14/2012  . Insomnia 07/14/2012   Past Medical History:  Diagnosis Date  . Anemia 11/24/2017  . Anxiety   . Bone spur    cervical spine  . Chronic kidney disease 04/2017   kidney infections  . Depression   . Deviated nasal septum    states is unable to lie flat, because her nose will become congested and she can stop breathing  . Diabetes mellitus without complication ([place])   . Dysrhythmia    heart flutters occasionally  . Fibromyalgia   . Fibromyalgia   . GERD (gastroesophageal reflux disease)   . Hallux abductovalgus with bunions 09/2014   right   . Hammertoe 09/2014   right 4th, 5th  . History of stomach ulcers   . Hyperlipidemia   . Hypertension    states is under control with med., has been on med. x 8 mos.  . IBS (irritable bowel syndrome)    no current med.  . Osteoarthritis    hands, bilateral hips  . Peripheral vascular disease (HCC)    occasional swelling in both legs  . Sinus headache     Family History  Problem Relation Age of Onset  . Hypertension Mother   . Diabetes Mother   . Heart disease Mother   . Hypertension Father   . Diabetes Father   . Prostate cancer Father   . Heart disease Father   . Alcohol abuse Father   . Hypertension Sister   . Diabetes Sister   . Heart disease Maternal Grandmother        Great GM  . Breast cancer Maternal Grandmother   . Bone cancer Maternal Grandmother   . Breast cancer Maternal Aunt   . Ovarian cancer Maternal Aunt   . Rheum arthritis Sister   . Colon cancer Other        great Aunt    Past Surgical History:  Procedure Laterality  Date  . ABDOMINAL HYSTERECTOMY    . BREAST BIOPSY Left 04/2018   benign  . BREAST BIOPSY Left 2016   benign  . BUNIONECTOMY Right 10/04/2014   Procedure:  Altamese [place], Emmaline Life;  Surgeon: Trula Slade, DPM;  Location: [facility];  Service: Podiatry;  Laterality: Right;  . COLONOSCOPY WITH PROPOFOL  02/13/2013  . CYSTOSCOPY N/A 07/07/2017   Procedure: CYSTOSCOPY;  Surgeon: Aletha Halim, MD;  Location: [facility];  Service: Gynecology;  Laterality: N/A;  . DUPUYTREN / PALMAR FASCIOTOMY Right 07/18/2013   exc. of multiple nodules  . 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 HAMMER TOE SURGERY Right 10/04/2014   Procedure: HAMMER TOE REPAIR 4TH  AND 5TH  RIGHT FOOT  fourth toe fixation with k wire;  Surgeon: Trula Slade, DPM;  Location: [facility];  Service: Podiatry;  Laterality: Right;  . 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 TUBAL LIGATION    . VAGINAL HYSTERECTOMY N/A 07/07/2017   Procedure: HYSTERECTOMY VAGINAL;  Surgeon: Aletha Halim, MD;  Location: [facility];  Service: Gynecology;  Laterality: N/A;   Social History   Occupational History  . Occupation: cook  Tobacco Use  . Smoking status: Former Smoker    Packs/day: 1.00    Years: 20.00    Pack years: 20.00    Types: Cigarettes    Quit date: 08/24/2012    Years since quitting: 7.6  . Smokeless tobacco: Never Used  Vaping Use  . Vaping Use: Some days  Substance and Sexual Activity  . Alcohol use: Yes    Comment: occasionally  . Drug use: Yes    Types: Marijuana  . Sexual activity: Yes    Birth control/protection: Surgical

## 2020-04-07 LAB — PANCREATIC ELASTASE, FECAL: Pancreatic Elastase-1, Stool: 412 mcg/g

## 2020-04-07 MED FILL — POLYETHYLENE GLYCOL 3350 PO: 17 | 1 days supply | Qty: 476 | Fill #0

## 2020-04-08 ENCOUNTER — Other Ambulatory Visit: Payer: Self-pay | Admitting: Gastroenterology

## 2020-04-10 ENCOUNTER — Other Ambulatory Visit: Payer: Self-pay

## 2020-04-10 ENCOUNTER — Ambulatory Visit (AMBULATORY_SURGERY_CENTER): Payer: Self-pay | Admitting: Internal Medicine

## 2020-04-10 ENCOUNTER — Encounter: Payer: Self-pay | Admitting: Internal Medicine

## 2020-04-10 VITALS — BP 141/75 | HR 56 | Temp 97.5°F | Resp 22 | Ht 69.0 in | Wt 157.0 lb

## 2020-04-10 DIAGNOSIS — R197 Diarrhea, unspecified: Secondary | ICD-10-CM

## 2020-04-10 DIAGNOSIS — R634 Abnormal weight loss: Secondary | ICD-10-CM

## 2020-04-10 DIAGNOSIS — R194 Change in bowel habit: Secondary | ICD-10-CM

## 2020-04-10 LAB — SARS CORONAVIRUS 2 (TAT 6-24 HRS): SARS Coronavirus 2: NEGATIVE

## 2020-04-10 MED ORDER — SODIUM CHLORIDE 0.9 % IV SOLN: 500.0000 mL | Freq: Once | INTRAVENOUS | Status: DC

## 2020-04-10 NOTE — Progress Notes (Signed)
Called to room to assist during endoscopic procedure.  Patient ID and intended procedure confirmed with present staff. Received instructions for my participation in the procedure from the performing physician.  

## 2020-04-10 NOTE — Progress Notes (Signed)
Medical history reviewed with no changes noted. VS assessed by C.W 

## 2020-04-10 NOTE — Patient Instructions (Addendum)
Both exams look normal.  The esophagus stomach and upper intestine are normal in appearance. The colon and the end of the small intestine are normal.  I did take biopsies of the colon because sometimes there can be microscopic inflammation causing your problems.  Once I get those biopsy results back we will contact you with recommendations and plans.  We will figure out a way to help you.  I appreciate the opportunity to care for you Gatha Mayer, MD, [facility]  Resume previous diet Continue current medications Repeat colonoscopy in 10 years  YOU HAD AN ENDOSCOPIC PROCEDURE TODAY AT [facility]:   Refer to the procedure report that was given to you for any specific questions about what was found during the examination.  If the procedure report does not answer your questions, please call your gastroenterologist to clarify.  If you requested that your care partner not be given the details of your procedure findings, then the procedure report has been included in a sealed envelope for you to review at your convenience later.  YOU SHOULD EXPECT: Some feelings of bloating in the abdomen. Passage of more gas than usual.  Walking can help get rid of the air that was put into your GI tract during the procedure and reduce the bloating. If you had a lower endoscopy (such as a colonoscopy or flexible sigmoidoscopy) you may notice spotting of blood in your stool or on the toilet paper. If you underwent a bowel prep for your procedure, you may not have a normal bowel movement for a few days.  Please Note:  You might notice some irritation and congestion in your nose or some drainage.  This is from the oxygen used during your procedure.  There is no need for concern and it should clear up in a day or so.  SYMPTOMS TO REPORT IMMEDIATELY:   Following lower endoscopy (colonoscopy or flexible sigmoidoscopy):  Excessive amounts of blood in the stool  Significant tenderness or worsening of  abdominal pains  Swelling of the abdomen that is new, acute  Fever of 100F or higher   Following upper endoscopy (EGD)  Vomiting of blood or coffee ground material  New chest pain or pain under the shoulder blades  Painful or persistently difficult swallowing  New shortness of breath  Fever of 100F or higher  Black, tarry-looking stools  For urgent or emergent issues, a gastroenterologist can be reached at any hour by calling [phone]. Do not use MyChart messaging for urgent concerns.   DIET:  We do recommend a small meal at first, but then you may proceed to your regular diet.  Drink plenty of fluids but you should avoid alcoholic beverages for 24 hours.  ACTIVITY:  You should plan to take it easy for the rest of today and you should NOT DRIVE or use heavy machinery until tomorrow (because of the sedation medicines used during the test).    FOLLOW UP: Our staff will call the number listed on your records 48-72 hours following your procedure to check on you and address any questions or concerns that you may have regarding the information given to you following your procedure. If we do not reach you, we will leave a message.  We will attempt to reach you two times.  During this call, we will ask if you have developed any symptoms of COVID 19. If you develop any symptoms (ie: fever, flu-like symptoms, shortness of breath, cough etc.) before then, please call [phone].  If you test positive for Covid 19 in the 2 weeks post procedure, please call and report this information to [place].    If any biopsies were taken you will be contacted by phone or by letter within the next 1-3 weeks.  Please call us at [phone] if you have not heard about the biopsies in 3 weeks.   SIGNATURES/CONFIDENTIALITY: You and/or your care partner have signed paperwork which will be entered into your electronic medical record.  These signatures attest to the fact that that the information above on your  After Visit Summary has been reviewed and is understood.  Full responsibility of the confidentiality of this discharge information lies with you and/or your care-partner.

## 2020-04-10 NOTE — Progress Notes (Signed)
A/ox3, pleased with MAC, report to RN 

## 2020-04-10 NOTE — Op Note (Signed)
[facility] Patient Name: Isabel Evans Procedure Date: 04/10/2020 2:43 PM MRN: [id number] Endoscopist: Gatha Mayer , MD Age: 49 Referring MD:  Date of Birth: 01/26/1971 Gender: Female Account #: [id number] Procedure:                Upper GI endoscopy Indications:              Weight loss Medicines:                Propofol per Anesthesia, Monitored Anesthesia Care Procedure:                Pre-Anesthesia Assessment:                           - Prior to the procedure, a History and Physical                            was performed, and patient medications and                            allergies were reviewed. The patient's tolerance of                            previous anesthesia was also reviewed. The risks                            and benefits of the procedure and the sedation                            options and risks were discussed with the patient.                            All questions were answered, and informed consent                            was obtained. Prior Anticoagulants: The patient has                            taken no previous anticoagulant or antiplatelet                            agents. ASA Grade Assessment: II - A patient with                            mild systemic disease. After reviewing the risks                            and benefits, the patient was deemed in                            satisfactory condition to undergo the procedure.                           After obtaining informed consent, the endoscope was  passed under direct vision. Throughout the                            procedure, the patient's blood pressure, pulse, and                            oxygen saturations were monitored continuously. The                            Endoscope was introduced through the mouth, and                            advanced to the second part of duodenum. The upper                            GI endoscopy was  accomplished without difficulty.                            The patient tolerated the procedure well. Scope In: Scope Out: Findings:                 The esophagus was normal.                           The stomach was normal.                           The examined duodenum was normal.                           The cardia and gastric fundus were normal on                            retroflexion. Complications:            No immediate complications. Estimated Blood Loss:     Estimated blood loss: none. Impression:               - Normal esophagus.                           - Normal stomach.                           - Normal examined duodenum.                           - No specimens collected. Recommendation:           - Patient has a contact number available for                            emergencies. The signs and symptoms of potential                            delayed complications were discussed with the  patient. Return to normal activities tomorrow.                            Written discharge instructions were provided to the                            patient.                           - Resume previous diet.                           - Continue present medications.                           - See the other procedure note for documentation of                            additional recommendations. Gatha Mayer, MD 04/10/2020 3:32:23 PM This report has been signed electronically.

## 2020-04-10 NOTE — Op Note (Signed)
[facility] Patient Name: Infant Doane Procedure Date: 04/10/2020 2:42 PM MRN: [id number] Endoscopist: Gatha Mayer , MD Age: 49 Referring MD:  Date of Birth: 17-Jun-1970 Gender: Female Account #: [id number] Procedure:                Colonoscopy Indications:              Change in bowel habits, Weight loss Medicines:                Propofol per Anesthesia, Monitored Anesthesia Care Procedure:                Pre-Anesthesia Assessment:                           - Prior to the procedure, a History and Physical                            was performed, and patient medications and                            allergies were reviewed. The patient's tolerance of                            previous anesthesia was also reviewed. The risks                            and benefits of the procedure and the sedation                            options and risks were discussed with the patient.                            All questions were answered, and informed consent                            was obtained. Prior Anticoagulants: The patient has                            taken no previous anticoagulant or antiplatelet                            agents. ASA Grade Assessment: II - A patient with                            mild systemic disease. After reviewing the risks                            and benefits, the patient was deemed in                            satisfactory condition to undergo the procedure.                           After obtaining informed consent, the colonoscope  was passed under direct vision. Throughout the                            procedure, the patient's blood pressure, pulse, and                            oxygen saturations were monitored continuously. The                            Olympus [id number]) Colonoscope was                            introduced through the anus and advanced to the the                             terminal ileum, with identification of the                            appendiceal orifice and IC valve. The colonoscopy                            was performed without difficulty. The patient                            tolerated the procedure well. The quality of the                            bowel preparation was good. The terminal ileum,                            ileocecal valve, appendiceal orifice, and rectum                            were photographed. Scope In: 2:59:40 PM Scope Out: 3:17:16 PM Scope Withdrawal Time: 0 hours 14 minutes 7 seconds  Total Procedure Duration: 0 hours 17 minutes 36 seconds  Findings:                 The perianal and digital rectal examinations were                            normal.                           The terminal ileum appeared normal.                           The entire examined colon appeared normal on direct                            and retroflexion views.                           Biopsies for histology were taken with a cold  forceps from the right colon and left colon for                            evaluation of microscopic colitis. Complications:            No immediate complications. Estimated Blood Loss:     Estimated blood loss was minimal. Impression:               - The examined portion of the ileum was normal.                           - The entire examined colon is normal on direct and                            retroflexion views.                           - Biopsies were taken with a cold forceps from the                            right colon and left colon for evaluation of                            microscopic colitis. Recommendation:           - Patient has a contact number available for                            emergencies. The signs and symptoms of potential                            delayed complications were discussed with the                            patient. Return to normal  activities tomorrow.                            Written discharge instructions were provided to the                            patient.                           - Resume previous diet.                           - Continue present medications.                           - Await pathology results.                           - Repeat colonoscopy in 10 years for screening                            purposes. Gatha Mayer, MD 04/10/2020 3:37:27 PM This report has  been signed electronically.

## 2020-04-14 ENCOUNTER — Telehealth: Payer: Self-pay | Admitting: *Deleted

## 2020-04-14 NOTE — Telephone Encounter (Signed)
  Follow up Call-  Call back number 04/10/2020  Post procedure Call Back phone  # [phone]  Permission to leave phone message Yes  Some recent data might be hidden     Patient questions:  Do you have a fever, pain , or abdominal swelling? No. Pain Score  0 *  Have you tolerated food without any problems? Yes.    Have you been able to return to your normal activities? Yes.    Do you have any questions about your discharge instructions: Diet   No. Medications  No. Follow up visit  No.  Do you have questions or concerns about your Care? No.  Actions: * If pain score is 4 or above: No action needed, pain <4.  1. Have you developed a fever since your procedure? no  2.   Have you had an respiratory symptoms (SOB or cough) since your procedure? no  3.   Have you tested positive for COVID 19 since your procedure no  4.   Have you had any family members/close contacts diagnosed with the COVID 19 since your procedure?  no   If yes to any of these questions please route to Laverna Peace, RN and Karlton Lemon, RN

## 2020-04-16 ENCOUNTER — Other Ambulatory Visit: Payer: Self-pay | Admitting: Family Medicine

## 2020-04-16 ENCOUNTER — Other Ambulatory Visit: Payer: Self-pay | Admitting: Family

## 2020-04-16 DIAGNOSIS — E782 Mixed hyperlipidemia: Secondary | ICD-10-CM

## 2020-04-16 DIAGNOSIS — M2550 Pain in unspecified joint: Secondary | ICD-10-CM

## 2020-04-16 DIAGNOSIS — M542 Cervicalgia: Secondary | ICD-10-CM

## 2020-04-16 MED FILL — traMADol HCL 50 MG TABS: 50 | 30 days supply | Qty: 90 | Fill #3

## 2020-04-16 MED FILL — ONDANSETRON ODT 8 MG TABLET: 8 | 20 days supply | Qty: 20 | Fill #2

## 2020-04-16 MED FILL — ?DULOXETINE HCL 20MG CPE: 20 | 30 days supply | Qty: 30 | Fill #2

## 2020-04-16 MED FILL — ?OMEPRAZOLE 20 MG CPDR: 20 | 30 days supply | Qty: 60 | Fill #1

## 2020-04-16 MED FILL — VENLAFAXINE HCL ER 150 MG C: 150 | 30 days supply | Qty: 30 | Fill #1

## 2020-04-16 MED FILL — DICLOFENAC SOD EC 50 MG TAB: 50 | 30 days supply | Qty: 60 | Fill #0

## 2020-04-16 MED FILL — METFORMIN HCL 500 MG TABS: 500 | 30 days supply | Qty: 30 | Fill #1

## 2020-04-16 MED FILL — GABAPENTIN 300 MG CAPSULE: 300 | 30 days supply | Qty: 90 | Fill #1

## 2020-04-16 MED FILL — tiZANidine HCL 4 MG TABS: 4 | 30 days supply | Qty: 90 | Fill #3

## 2020-04-17 ENCOUNTER — Other Ambulatory Visit: Payer: Self-pay | Admitting: Family Medicine

## 2020-04-17 DIAGNOSIS — K582 Mixed irritable bowel syndrome: Secondary | ICD-10-CM

## 2020-04-17 MED FILL — DICYCLOMINE 10 MG CAPSULE: 10 | 15 days supply | Qty: 60 | Fill #0

## 2020-04-21 ENCOUNTER — Other Ambulatory Visit: Payer: Self-pay

## 2020-04-21 DIAGNOSIS — R194 Change in bowel habit: Secondary | ICD-10-CM

## 2020-04-21 DIAGNOSIS — R634 Abnormal weight loss: Secondary | ICD-10-CM

## 2020-04-23 ENCOUNTER — Other Ambulatory Visit: Payer: Self-pay

## 2020-04-23 ENCOUNTER — Ambulatory Visit (INDEPENDENT_AMBULATORY_CARE_PROVIDER_SITE_OTHER)
Admission: RE | Admit: 2020-04-23 | Discharge: 2020-04-23 | Disposition: A | Discharge status: Home / Self Care | Payer: Self-pay | Source: Ambulatory Visit | Attending: Internal Medicine | Admitting: Internal Medicine

## 2020-04-23 DIAGNOSIS — R194 Change in bowel habit: Secondary | ICD-10-CM

## 2020-04-23 DIAGNOSIS — R634 Abnormal weight loss: Secondary | ICD-10-CM

## 2020-04-23 MED ORDER — IOHEXOL 300 MG/ML  SOLN
100.0000 mL | Freq: Once | INTRAMUSCULAR | Status: AC | PRN
  Administered 2020-04-23: 100 mL via INTRAVENOUS

## 2020-04-28 MED FILL — DICYCLOMINE 10 MG CAPSULE: 10 | 15 days supply | Qty: 60 | Fill #0

## 2020-05-05 ENCOUNTER — Other Ambulatory Visit: Payer: Self-pay

## 2020-05-05 ENCOUNTER — Other Ambulatory Visit (HOSPITAL_COMMUNITY): Payer: Self-pay | Admitting: Psychiatry

## 2020-05-05 ENCOUNTER — Telehealth (INDEPENDENT_AMBULATORY_CARE_PROVIDER_SITE_OTHER): Payer: No Payment, Other | Admitting: Psychiatry

## 2020-05-05 ENCOUNTER — Encounter (HOSPITAL_COMMUNITY): Payer: Self-pay

## 2020-05-05 DIAGNOSIS — F411 Generalized anxiety disorder: Secondary | ICD-10-CM | POA: Diagnosis not present

## 2020-05-05 MED ORDER — TRAZODONE HCL 50 MG PO TABS: 25.0000 mg | ORAL_TABLET | Freq: Every evening | ORAL | 2 refills | Status: DC | PRN

## 2020-05-05 MED FILL — traZODone HCL 50 MG TABS: 50 | 30 days supply | Qty: 15 | Fill #0

## 2020-05-05 NOTE — Progress Notes (Signed)
Psychiatric Initial Adult Assessment   Patient Identification: Isabel Evans MRN:  [id number] Date of Evaluation:  05/05/2020 Referral Source: PCP Chief Complaint:   Chief Complaint    Depression; Stress; Anxiety     Visit Diagnosis:  General anxiety disorder  History of Present Illness:   50 yo female with multiple, chronic medical issues triggering her depression and anxiety.  Current depression and anxiety levels are 6/10 with a few panic attacks weekly with feelings of her "heart flying out of my chest".  Initially thought this was health related and after tests the MD referred her for anxiety.  Taking Klonopin 0.5 mg daily PRN for years which does help along with her Cymbalta and gabapentin.  Stress and health issues make it worse.  Frequently irritable and difficulties at work at times.  No suicidal/homicidal ideations, hallucinations, or alcohol abuse.  Uses cannabis in the form of vaping a couple of times a day for anxiety and pain.  Discussed the link between cannabis and anxiety and encouraged tapering off. Sleep is fair to poor, initiation being bad.  Appetite effected by IBS and weight loss of 80 pounds in the past 8 months, addressing with her medical provider.  No past psychiatric inpatient history or therapy.  Encouraged therapy and discussed medication changes.  She is currently tapering off Effexor 150 mg taking it about every 3 days and takes Cymbalta 20 mg daily, when increase she experienced night sweats.  Decided to wean off Effexor and eventually Cymbalta with the goal to start Zoloft which helps her sister.  Will continue the Klonopin and follow up in 3 weeks, sooner if needed.    Associated Signs/Symptoms: Depression Symptoms:  depressed mood, anxiety, panic attacks, (Hypo) Manic Symptoms:  none Anxiety Symptoms:  Excessive Worry, Panic Symptoms, Psychotic Symptoms:  none PTSD Symptoms: NA  Past Psychiatric History: depression and anxiety  Previous  Psychotropic Medications: Yes   Substance Abuse History in the last 12 months:  Yes.    Consequences of Substance Abuse: NA  Past Medical History:  Past Medical History:  Diagnosis Date  . Anemia 11/24/2017  . Anxiety   . Bone spur    cervical spine  . Chronic kidney disease 04/2017   kidney infections  . Depression   . Deviated nasal septum    states is unable to lie flat, because her nose will become congested and she can stop breathing  . Diabetes mellitus without complication ([place])   . Dysrhythmia    heart flutters occasionally  . Fibromyalgia   . Fibromyalgia   . GERD (gastroesophageal reflux disease)   . Hallux abductovalgus with bunions 09/2014   right   . Hammertoe 09/2014   right 4th, 5th  . History of stomach ulcers   . Hyperlipidemia   . Hypertension    states is under control with med., has been on med. x 8 mos.  . IBS (irritable bowel syndrome)    no current med.  . Osteoarthritis    hands, bilateral hips  . Peripheral vascular disease (HCC)    occasional swelling in both legs  . Sinus headache     Past Surgical History:  Procedure Laterality Date  . ABDOMINAL HYSTERECTOMY    . BREAST BIOPSY Left 04/2018   benign  . BREAST BIOPSY Left 2016   benign  . BUNIONECTOMY Right 10/04/2014   Procedure:  Altamese , Emmaline Life;  Surgeon: Trula Slade, DPM;  Location: [facility];  Service: Podiatry;  Laterality: Right;  .  COLONOSCOPY WITH PROPOFOL  02/13/2013  . CYSTOSCOPY N/A 07/07/2017   Procedure: CYSTOSCOPY;  Surgeon: Aletha Halim, MD;  Location: [facility];  Service: Gynecology;  Laterality: N/A;  . DUPUYTREN / PALMAR FASCIOTOMY Right 07/18/2013   exc. of multiple nodules  . 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 HAMMER TOE SURGERY Right 10/04/2014   Procedure: HAMMER TOE REPAIR 4TH  AND 5TH  RIGHT FOOT  fourth toe fixation with k wire;  Surgeon: Trula Slade, DPM;  Location: [facility];  Service: Podiatry;  Laterality: Right;  . 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 TUBAL LIGATION    . VAGINAL HYSTERECTOMY N/A 07/07/2017   Procedure: HYSTERECTOMY VAGINAL;  Surgeon: Aletha Halim, MD;  Location: [facility];  Service: Gynecology;  Laterality: N/A;    Family Psychiatric History: see below, mother and sister with depression and anxiety  Family History:  Family History  Problem Relation Age of Onset  . Hypertension Mother   . Diabetes Mother   . Heart disease Mother   . Hypertension Father   . Diabetes Father   . Prostate cancer Father   . Heart disease Father   . Alcohol abuse Father   . Hypertension Sister   . Diabetes Sister   . Heart disease Maternal Grandmother        Great GM  . Breast cancer Maternal Grandmother   . Bone cancer Maternal Grandmother   . Breast cancer Maternal Aunt   . Ovarian cancer Maternal Aunt   . Rheum arthritis Sister   . Colon cancer Other        great Aunt  . Rectal cancer Neg Hx   . Stomach cancer Neg Hx     Social History:   Social History   Socioeconomic History  . Marital status: Single    Spouse name: Not on file  . Number of children: 3  . Years of education: Not on file  . Highest education level: 7th grade  Occupational History  . Occupation: cook  Tobacco Use  . Smoking status: Former Smoker    Packs/day: 1.00    Years: 20.00    Pack years: 20.00    Types: Cigarettes    Quit date: 08/24/2012    Years since quitting: 7.7  . Smokeless tobacco: Never Used  Vaping Use  . Vaping Use: Some days  Substance and Sexual Activity  . Alcohol use: Yes    Comment: occasionally  . Drug use: Yes    Types: Marijuana  . Sexual activity: Yes    Birth control/protection: Surgical  Other Topics Concern  . Not on file  Social History Narrative  . Not on file   Social Determinants of Health   Financial Resource Strain: Not on file  Food Insecurity: Not on file  Transportation Needs: No  Transportation Needs  . Lack of Transportation (Medical): No  . Lack of Transportation (Non-Medical): No  Physical Activity: Not on file  Stress: Not on file  Social Connections: Not on file    Additional Social History: lives with supportive partner, works part-time  Allergies:  No Known Allergies  Metabolic Disorder Labs: Lab Results  Component Value Date   HGBA1C 5.3 01/04/2020   No results found for: PROLACTIN Lab Results  Component Value Date   CHOL 147 08/24/2019   TRIG 87 08/24/2019   HDL 69 08/24/2019   CHOLHDL 2.1 08/24/2019   LDLCALC 62 08/24/2019  [place] 60 07/06/2019   Lab Results  Component Value Date   TSH 0.606 01/04/2020    Therapeutic Level Labs: No results found for: LITHIUM No results found for: CBMZ No results found for: VALPROATE  Current Medications: Current Outpatient Medications  Medication Sig Dispense Refill  . clonazePAM (KLONOPIN) 0.5 MG tablet Take 1 bid prn anxiety.  Use sparingly 30 tablet 1  . diclofenac (VOLTAREN) 50 MG EC tablet TAKE 1 TABLET (50 MG TOTAL) BY MOUTH 2 (TWO) TIMES DAILY. AFTER A MEAL AS NEEDED FOR PAIN 60 tablet 0  . dicyclomine (BENTYL) 10 MG capsule TAKE 1 CAPSULE BY MOUTH 4 TIMES DAILY AS NEEDED FOR SPASMS. 60 capsule 2  . diphenhydrAMINE (BENADRYL) 25 MG tablet Take 25 mg by mouth daily as needed for allergies. (Patient not taking: Reported on 04/10/2020)    . DULoxetine (CYMBALTA) 20 MG capsule TAKE 1 CAPSULE BY MOUTH DAILY. 90 capsule 1  . fluticasone (FLONASE) 50 MCG/ACT nasal spray PLACE 2 SPRAYS INTO BOTH NOSTRILS DAILY. 16 g 11  . gabapentin (NEURONTIN) 300 MG capsule TAKE 1 CAPSULE BY MOUTH 2 TIMES DAILY AND 2 CAPSULES AT BEDTIME. 120 capsule 0  . metFORMIN (GLUCOPHAGE) 500 MG tablet TAKE 1 TABLET (500 MG TOTAL) BY MOUTH DAILY WITH BREAKFAST. 90 tablet 0  . omeprazole (PRILOSEC) 20 MG capsule TAKE 2 CAPSULES (40 MG TOTAL) BY MOUTH DAILY. 60 capsule 2  . ondansetron (ZOFRAN-ODT) 8 MG disintegrating tablet  TAKE 1 TABLET (8 MG TOTAL) BY MOUTH DAILY AS NEEDED FOR NAUSEA OR VOMITING. 20 tablet 2  . rosuvastatin (CRESTOR) 20 MG tablet Take 1 tablet (20 mg total) by mouth daily. To lower cholesterol 30 tablet 2  . tiZANidine (ZANAFLEX) 4 MG tablet Take 1 tablet (4 mg total) by mouth every 8 (eight) hours as needed for muscle spasms. Start with 1/2 pill (2 mg) as patient can cause drowsiness 90 tablet 5  . traMADol (ULTRAM) 50 MG tablet TAKE 1 TABLET (50 MG TOTAL) BY MOUTH 3 (THREE) TIMES DAILY. AS NEEDED FOR PAIN. (30 DAY SUPPLY) 90 tablet 5   No current facility-administered medications for this visit.    Musculoskeletal: Strength & Muscle Tone: within normal limits Gait & Station: normal Patient leans: N/A  Psychiatric Specialty Exam: Review of Systems  Psychiatric/Behavioral: Positive for dysphoric mood and sleep disturbance. The patient is nervous/anxious.   All other systems reviewed and are negative.   Last menstrual period 07/04/2017.There is no height or weight on file to calculate BMI.  General Appearance: Casual  Eye Contact:  Good  Speech:  Normal Rate  Volume:  Normal  Mood:  Anxious and Depressed  Affect:  Congruent  Thought Process:  Coherent and Descriptions of Associations: Intact  Orientation:  Full (Time, Place, and Person)  Thought Content:  WDL and Logical  Suicidal Thoughts:  No  Homicidal Thoughts:  No  Memory:  Immediate;   Good Recent;   Good Remote;   Good  Judgement:  Good  Insight:  Good  Psychomotor Activity:  Normal  Concentration:  Concentration: Good and Attention Span: Good  Recall:  Good  Fund of Knowledge:Good  Language: Good  Akathisia:  No  Handed:  Right  AIMS (if indicated):  not done  Assets:  Housing Intimacy Leisure Time Resilience Social Support  ADL's:  Intact  Cognition: WNL  Sleep:  Fair   Screenings: GAD-7   Flowsheet Row Video Visit from 05/05/2020 in [facility] Visit from 01/04/2020 in  [facility]  [place] Office Visit from 07/06/2019 in [facility] Office Visit from 09/06/2018 in [facility] Office Visit from 04/13/2018 in [facility]  Total GAD-7 Score 20 16 17 20 19     PHQ2-9   [place] from 03/24/2020 in [facility] Office Visit from 02/28/2020 in [place] Office Visit from 01/04/2020 in [facility] Office Visit from 07/06/2019 in [facility] Office Visit from 09/06/2018 in [place]  PHQ-2 Total Score 1 5 4 4 6   PHQ-9 Total Score 4 16 15 16 18       Assessment and Plan:  General anxiety disorder and panic disorder: -Continue Klonopin 0.5 mg daily PRN -Continue gabapentin 300 mg TID for anxiety and neuropathic pain  Depression: -Continue Cymbalta 20 mg daily -Continue to taper off Effexor 150 mg daily (now taking every 3 days)  Insomnia: -Start Trazodone 25 mg at bedtime PRN  Virtual Visit via Video Note  I connected with [name] on 05/05/20 at  9:00 AM EST by a video enabled telemedicine application and verified that I am speaking with the correct person using two identifiers.  Location: Patient: home Provider: home   I discussed the limitations of evaluation and management by telemedicine and the availability of in person appointments. The patient expressed understanding and agreed to proceed.  Follow Up Instructions: Follow up in 3 weeks   I discussed the assessment and treatment plan with the patient. The patient was provided an opportunity to ask questions and all were answered. The patient agreed with the plan and demonstrated an understanding of the instructions.   The patient was advised to call back or seek an in-person evaluation if the symptoms worsen or if the  condition fails to improve as anticipated.  I provided 60 minutes of non-face-to-face time during this encounter.   Waylan Boga, NP    Waylan Boga, NP 1/17/20229:52 AM

## 2020-05-05 NOTE — Addendum Note (Signed)
Addended by: Patrecia Pour on: 05/05/2020 10:07 AM   Modules accepted: Orders

## 2020-05-06 ENCOUNTER — Telehealth: Payer: Self-pay | Admitting: General Practice

## 2020-05-06 NOTE — Telephone Encounter (Signed)
Pt is in need of the following refills:   fluticasone (FLONASE) 50 MCG/ACT nasal spray [[id number]]  rosuvastatin (CRESTOR) 20 MG tablet [[id number]]  metFORMIN (GLUCOPHAGE) 500 MG tablet [[id number]]   amLODipine (NORVASC) 10 MG tablet [[id number]] DISCONTINUED  Pt currently using [facility]   Please advise and thank you

## 2020-05-07 ENCOUNTER — Other Ambulatory Visit: Payer: Self-pay | Admitting: Pharmacist

## 2020-05-07 ENCOUNTER — Other Ambulatory Visit: Payer: Self-pay

## 2020-05-07 DIAGNOSIS — R7303 Prediabetes: Secondary | ICD-10-CM

## 2020-05-07 DIAGNOSIS — F411 Generalized anxiety disorder: Secondary | ICD-10-CM

## 2020-05-07 DIAGNOSIS — E782 Mixed hyperlipidemia: Secondary | ICD-10-CM

## 2020-05-07 MED ORDER — ROSUVASTATIN CALCIUM 20 MG PO TABS: 20.0000 mg | ORAL_TABLET | Freq: Every day | ORAL | 2 refills | Status: DC

## 2020-05-07 MED ORDER — AMLODIPINE BESYLATE 10 MG PO TABS: 10.0000 mg | ORAL_TABLET | Freq: Every day | ORAL | 0 refills | Status: DC

## 2020-05-07 MED ORDER — METFORMIN HCL 500 MG PO TABS: 500.0000 mg | ORAL_TABLET | Freq: Every day | ORAL | 0 refills | Status: DC

## 2020-05-07 MED ORDER — FLUTICASONE PROPIONATE 50 MCG/ACT NA SUSP: 2.0000 | Freq: Every day | NASAL | 11 refills | Status: DC

## 2020-05-07 MED FILL — AMLODIPINE BESYLATE 10 MG T: 10 | 30 days supply | Qty: 30 | Fill #0

## 2020-05-07 MED FILL — ROSUVASTATIN CALCIUM 20 MG: 20 | 30 days supply | Qty: 30 | Fill #0

## 2020-05-07 MED FILL — FLUTICASONE PROP 50 MCG SPR: 50 | 30 days supply | Qty: 16 | Fill #0

## 2020-05-07 NOTE — Telephone Encounter (Signed)
Medications have been refilled and pt has been notified

## 2020-05-12 ENCOUNTER — Ambulatory Visit (INDEPENDENT_AMBULATORY_CARE_PROVIDER_SITE_OTHER): Payer: Self-pay | Admitting: Nurse Practitioner

## 2020-05-12 ENCOUNTER — Other Ambulatory Visit: Payer: Self-pay

## 2020-05-12 ENCOUNTER — Encounter: Payer: Self-pay | Admitting: Nurse Practitioner

## 2020-05-12 VITALS — BP 128/72 | HR 96 | Ht 69.0 in | Wt 158.0 lb

## 2020-05-12 DIAGNOSIS — R194 Change in bowel habit: Secondary | ICD-10-CM

## 2020-05-12 DIAGNOSIS — R197 Diarrhea, unspecified: Secondary | ICD-10-CM

## 2020-05-12 NOTE — Progress Notes (Signed)
05/12/2020 Isabel Evans [id number] [birth year]   Chief Complaint: white specs in stool, diarrhea, constipation  History of Present Illness: Isabel Evans is a 50 year old female with a past medical history of anxiety, depression, fibromyalgia, hypertension, hyperlipidemia, diabetes mellitus type 2, chronic lower back pain and GERD.  Refer to office consult 03/24/2020 for comprehensive history. She underwent an EGD and colonoscopy by Dr. Carlean Purl 04/10/2020 due to having weight loss GERD and alternating diarrhea and constipation. The EGD and colonoscopy were normal. An abdominal/pelvic CT scan without significant findings, no explanation for her symptoms or weight loss. She presents today for further follow up. Her GERD symptoms are well controlled. She continues to have intermittent constipation. She can go 4 days without a BM and on the 5th day she has 2 or 3 episodes of nonbloody watery or mud like diarrhea. Her diarrhea stool sometimes floats. She continues to see white specs in her stools which is concerning her. Fecal pancreatic elastase levels were normal. She is passing a normal solid stool once or twice weekly. No further weight loss.    CBC Latest Ref Rng & Units 03/24/2020 01/04/2020 08/24/2019  WBC 4.0 - 10.5 K/uL 6.7 6.1 5.7  Hemoglobin 12.0 - 15.0 g/dL 13.2 13.2 13.9  Hematocrit 36.0 - 46.0 % 39.2 39.4 41.0  Platelets 150.0 - 400.0 K/uL 229.0 280 247    CMP Latest Ref Rng & Units 03/24/2020 01/04/2020 07/06/2019  Glucose 70 - 99 mg/dL 78 94 95  BUN 6 - 23 mg/dL 7 8 8   Creatinine 0.40 - 1.20 mg/dL 0.78 0.70 0.67  Sodium 135 - 145 mEq/L 139 140 140  Potassium 3.5 - 5.1 mEq/L 3.9 4.5 4.3  Chloride 96 - 112 mEq/L 100 101 100  CO2 19 - 32 mEq/L 31 26 28   Calcium 8.4 - 10.5 mg/dL 9.6 9.5 9.4  Total Protein 6.0 - 8.3 g/dL 7.3 6.9 7.1  Total Bilirubin 0.2 - 1.2 mg/dL 0.3 0.3 <0.2  Alkaline Phos 39 - 117 U/L 54 63 72  AST 0 - 37 U/L 12 15 19   ALT 0 - 35 U/L 12 17 12    CTAP  04/23/2020 by: 1. No acute abdominal/pelvic findings, mass lesions or adenopathy. 2. Status post hysterectomy. Both ovaries are still present and appear normal. 3. Minimal scattered atherosclerotic calcifications involving the distal aorta and iliac arteries.   EGD 04/10/2020 by Dr. Carlean Purl: - The esophagus was normal. - The stomach was normal. - The examined duodenum was normal. - The cardia and gastric fundus were normal on retroflexion.  Colonoscopy 04/10/2020 by Dr. Carlean Purl: - The examined portion of the ileum was normal. - The entire examined colon is normal on direct and retroflexion views. - Biopsies were taken with a cold forceps from the right colon and left colon for evaluation of microscopic colitis. Surgical [P], colon, random sites - COLONIC MUCOSA WITH NO SIGNIFICANT PATHOLOGIC FINDINGS. - NEGATIVE FOR ACTIVE INFLAMMATION AND OTHER ABNORMALITIES.  Current Outpatient Medications on File Prior to Visit  Medication Sig Dispense Refill  . amLODipine (NORVASC) 10 MG tablet Take 1 tablet (10 mg total) by mouth daily. 30 tablet 0  . clonazePAM (KLONOPIN) 0.5 MG tablet Take 1 bid prn anxiety.  Use sparingly 30 tablet 1  . diclofenac (VOLTAREN) 50 MG EC tablet TAKE 1 TABLET (50 MG TOTAL) BY MOUTH 2 (TWO) TIMES DAILY. AFTER A MEAL AS NEEDED FOR PAIN 60 tablet 0  . dicyclomine (BENTYL) 10 MG capsule TAKE 1 CAPSULE BY MOUTH  4 TIMES DAILY AS NEEDED FOR SPASMS. 60 capsule 2  . diphenhydrAMINE (BENADRYL) 25 MG tablet Take 25 mg by mouth daily as needed for allergies.    . DULoxetine (CYMBALTA) 20 MG capsule TAKE 1 CAPSULE BY MOUTH DAILY. 90 capsule 1  . fluticasone (FLONASE) 50 MCG/ACT nasal spray Place 2 sprays into both nostrils daily. 16 g 11  . gabapentin (NEURONTIN) 300 MG capsule TAKE 1 CAPSULE BY MOUTH 2 TIMES DAILY AND 2 CAPSULES AT BEDTIME. 120 capsule 0  . metFORMIN (GLUCOPHAGE) 500 MG tablet Take 1 tablet (500 mg total) by mouth daily with breakfast. 90 tablet 0  .  omeprazole (PRILOSEC) 20 MG capsule TAKE 2 CAPSULES (40 MG TOTAL) BY MOUTH DAILY. 60 capsule 2  . ondansetron (ZOFRAN-ODT) 8 MG disintegrating tablet TAKE 1 TABLET (8 MG TOTAL) BY MOUTH DAILY AS NEEDED FOR NAUSEA OR VOMITING. 20 tablet 2  . rosuvastatin (CRESTOR) 20 MG tablet Take 1 tablet (20 mg total) by mouth daily. To lower cholesterol 30 tablet 2  . tiZANidine (ZANAFLEX) 4 MG tablet Take 1 tablet (4 mg total) by mouth every 8 (eight) hours as needed for muscle spasms. Start with 1/2 pill (2 mg) as patient can cause drowsiness 90 tablet 5  . traMADol (ULTRAM) 50 MG tablet TAKE 1 TABLET (50 MG TOTAL) BY MOUTH 3 (THREE) TIMES DAILY. AS NEEDED FOR PAIN. (30 DAY SUPPLY) 90 tablet 5  . traZODone (DESYREL) 50 MG tablet Take 0.5 tablets (25 mg total) by mouth at bedtime as needed for sleep. 30 tablet 2   No current facility-administered medications on file prior to visit.   No Known Allergies  Current Medications, Allergies, Past Medical History, Past Surgical History, Family History and Social History were reviewed in [employer] record.   Review of Systems:   Constitutional: No further weight loss.  Respiratory: Negative for shortness of breath.   Cardiovascular: Negative for chest pain, palpitations and leg swelling.  Gastrointestinal: See HPI.  Musculoskeletal: Negative for back pain or muscle aches.  Neurological: Negative for dizziness, headaches or paresthesias.    Physical Exam: LMP 07/04/2017   BP 128/72   Pulse 96   Ht 5\' 9"  (1.753 m)   Wt 158 lb (71.7 kg)   LMP 07/04/2017   BMI 23.33 kg/m   Wt Readings from Last 3 Encounters:  05/12/20 158 lb (71.7 kg)  04/10/20 157 lb (71.2 kg)  03/24/20 157 lb (71.2 kg)   General: 50 year old female in no acute distress. Head: Normocephalic and atraumatic. Eyes: No scleral icterus. Conjunctiva pink . Ears: Normal auditory acuity. Mouth: Dentition intact. No ulcers or lesions.  Lungs: Clear throughout to  auscultation. Heart: Regular rate and rhythm, no murmur. Abdomen: Soft, nontender and nondistended. No masses or hepatomegaly. Normal bowel sounds x 4 quadrants.  Rectal: Deferred.  Musculoskeletal: Symmetrical with no gross deformities. Extremities: No edema. Neurological: Alert oriented x 4. No focal deficits.  Psychological: Alert and cooperative. Normal mood and affect  Assessment and Recommendations:  1. IBS, alternating diarrhea and constipation. Normal EGD and colonoscopy 03/2020. Abdominal/pelvic CT scan 04/23/2020 without significant findings, nothing to explain her symptoms. Patient concerned regarding white specs in stool. Pancreatic elastase level was normal.  -Benefiber 1 tablespoon daily if tolerated -O & P x 1  2. GERD symptoms stable. EGD 03/2020 was normal. -Continue Omeprazole 40 mg daily for now as the patient is not experiencing any heartburn symptoms and her weight loss has stabilized.  3. Unexplained weight loss,  stable. No further weight loss past 4 weeks.  -Patient advised to follow-up with her PCP to consider chest CT for lung cancer screening, patient was a past smoker

## 2020-05-12 NOTE — Patient Instructions (Signed)
If you are age 49 or older, your body mass index should be between 23-30. Your Body mass index is 23.33 kg/m. If this is out of the aforementioned range listed, please consider follow up with your Primary Care Provider.  If you are age 41 or younger, your body mass index should be between 19-25. Your Body mass index is 23.33 kg/m. If this is out of the aformentioned range listed, please consider follow up with your Primary Care Provider.    Please go to the lab and pick up the stool testing.  OVER THE COUNTER MEDICATION  Please purchase the following medications over the counter and take as directed:  Benefiber one tablespoon daily and any over the counter digestive enzyme.   Follow up with your primary care provider regarding lung cancer screening.  It was great seeing you today!  Thank you for entrusting me with your care and choosing [facility].  Noralyn Pick, CRNP

## 2020-05-13 ENCOUNTER — Other Ambulatory Visit: Payer: Self-pay | Admitting: Family Medicine

## 2020-05-13 DIAGNOSIS — M2550 Pain in unspecified joint: Secondary | ICD-10-CM

## 2020-05-13 DIAGNOSIS — M542 Cervicalgia: Secondary | ICD-10-CM

## 2020-05-13 MED FILL — tiZANidine HCL 4 MG TABS: 4 | 30 days supply | Qty: 90 | Fill #4

## 2020-05-13 MED FILL — METFORMIN HCL 500 MG TABS: 500 | 30 days supply | Qty: 30 | Fill #2

## 2020-05-13 MED FILL — ?OMEPRAZOLE 20 MG CPDR: 20 | 30 days supply | Qty: 60 | Fill #2

## 2020-05-13 MED FILL — DICLOFENAC SOD EC 50 MG TAB: 50 | 30 days supply | Qty: 60 | Fill #0

## 2020-05-14 MED FILL — GABAPENTIN 300 MG CAPSULE: 300 | 30 days supply | Qty: 90 | Fill #2

## 2020-05-14 MED FILL — traMADol HCL 50 MG TABS: 50 | 30 days supply | Qty: 90 | Fill #4

## 2020-05-16 ENCOUNTER — Encounter: Payer: Self-pay | Admitting: Orthopaedic Surgery

## 2020-05-16 ENCOUNTER — Ambulatory Visit (INDEPENDENT_AMBULATORY_CARE_PROVIDER_SITE_OTHER): Payer: Self-pay | Admitting: Orthopaedic Surgery

## 2020-05-16 VITALS — BP 108/75 | HR 89 | Ht 71.0 in | Wt 155.0 lb

## 2020-05-16 DIAGNOSIS — M5020 Other cervical disc displacement, unspecified cervical region: Secondary | ICD-10-CM

## 2020-05-16 NOTE — Progress Notes (Signed)
Office Visit Note   Patient: Isabel Evans           Date of Birth: July 02, 1970           MRN: [id number] Visit Date: 05/16/2020              Requested by: No referring provider defined for this encounter. PCP: Patient, No Pcp Per   Assessment & Plan: Visit Diagnoses:  1. Protrusion of cervical intervertebral disc     Plan: Patient needs new cervical MRI scan for comparison 2019 images for she had protrusion touching the cord at C5-6.  She said more right than left arm symptoms.  All follow-up after scan for review.  Follow-Up Instructions: No follow-ups on file.   Orders:  Orders Placed This Encounter  Procedures  . MR Cervical Spine w/o contrast   No orders of the defined types were placed in this encounter.     Procedures: No procedures performed   Clinical Data: No additional findings.   Subjective: Chief Complaint  Patient presents with  . multiple complaints    Pain in neck, head, bilat shoulders, back, hips, and knees    HPI 50 year old female returns with ongoing pain worse in the neck and radiation to the right arm with right hand numbness more in the left hand.  She also has low back pain but not as severe as her neck is been bothering her.  Previous MRI showed disc protrusion at C5-6 which was several years ago.  Patient works at [employer] at [employer].  She has had previous Dupuytren's in her hand treated by hand surgery at [facility] and had recurrence and they told her that repeat surgery is advised against.  She has some new transient opposite right hand left hand worse.  Patient is used Goody's powder with slight improvement.  No relief with prednisone Dosepak.  No chills or fever no bowel or bladder associated symptoms no claudication symptoms.  Review of Systems possible Dupuytren's right left hand.  Chronic neck pain disc fusion at C5-6 by cervical MRI scan.  Previous mild lumbar protrusions L2-3, L4-5 and L5-S1.  Patient reports  history of fibromyalgia all other systems noncontributory to HPI.   Objective: Vital Signs: BP 108/75   Pulse 89   Ht 5\' 11"  (1.803 m)   Wt 155 lb (70.3 kg)   LMP 07/04/2017   BMI 21.62 kg/m   Physical Exam Constitutional:      Appearance: She is well-developed.  HENT:     Head: Normocephalic.     Right Ear: External ear normal.     Left Ear: External ear normal.  Eyes:     Pupils: Pupils are equal, round, and reactive to light.  Neck:     Thyroid: No thyromegaly.     Trachea: No tracheal deviation.  Cardiovascular:     Rate and Rhythm: Normal rate.  Pulmonary:     Effort: Pulmonary effort is normal.  Abdominal:     Palpations: Abdomen is soft.  Skin:    General: Skin is warm and dry.  Neurological:     Mental Status: She is alert and oriented to person, place, and time.  Psychiatric:        Mood and Affect: Mood and affect normal.        Behavior: Behavior normal.     Ortho Exam patient has brachial plexus tenderness worse on the right than left positive Spurling on the right.  Recurrent Dupuytren's left  hand with MCP PIP contracture.  Recurrent cords in the palm and finger.  Less severe changes opposite hand on the right.  No thenar atrophy.  Upper extremity reflexes are 2+.  Specialty Comments:  No specialty comments available.  Imaging: No results found.   PMFS History: Patient Active Problem List   Diagnosis Date Noted  . Protrusion of cervical intervertebral disc 03/22/2018  . Anemia 11/24/2017  . Pre-diabetes 11/24/2017  . Blurry vision, bilateral 05/18/2017  . Callus of foot 05/18/2017  . Pain in joint of left hip 02/16/2017  . Bruises easily 11/10/2016  . Gastroesophageal reflux disease without esophagitis 09/22/2016  . Fatigue associated with anemia 09/22/2016  . Flexion contractures 09/22/2016  . Generalized anxiety disorder 01/01/2016  . Dental abscess 01/01/2016  . Healthcare maintenance 05/08/2015  . Adjustment disorder with mixed anxiety  and depressed mood 05/08/2015  . Stress at home 10/24/2014  . Irritable bowel syndrome 10/24/2014  . Anxiety state 10/24/2014  . Preop testing 09/19/2014  . Pap smear for cervical cancer screening 09/19/2014  . Midline low back pain without sciatica 08/12/2014  . [place] nodes 08/12/2014  . Contracture of joint, hand 08/12/2014  . Essential hypertension 02/11/2014  . Allergy 02/11/2014  . Screening for breast cancer 02/11/2014  . Dupuytren's contracture of both hands 11/15/2013  . Hallux valgus of right foot 11/15/2013  . Back pain 05/21/2013  . UTI (urinary tract infection) 05/21/2013  . Multiple skin nodules 11/27/2012  . Fibromyalgia 09/08/2012  . Osteoarthritis 09/08/2012  . Neck muscle spasm 07/14/2012  . Plantar wart 07/14/2012  . Insomnia 07/14/2012   Past Medical History:  Diagnosis Date  . Anemia 11/24/2017  . Anxiety   . Bone spur    cervical spine  . Chronic kidney disease 04/2017   kidney infections  . Depression   . Deviated nasal septum    states is unable to lie flat, because her nose will become congested and she can stop breathing  . Diabetes mellitus without complication ([place])   . Dysrhythmia    heart flutters occasionally  . Fibromyalgia   . Fibromyalgia   . GERD (gastroesophageal reflux disease)   . Hallux abductovalgus with bunions 09/2014   right   . Hammertoe 09/2014   right 4th, 5th  . History of stomach ulcers   . Hyperlipidemia   . Hypertension    states is under control with med., has been on med. x 8 mos.  . IBS (irritable bowel syndrome)    no current med.  . Osteoarthritis    hands, bilateral hips  . Peripheral vascular disease (HCC)    occasional swelling in both legs  . Sinus headache     Family History  Problem Relation Age of Onset  . Hypertension Mother   . Diabetes Mother   . Heart disease Mother   . Hypertension Father   . Diabetes Father   . Prostate cancer Father   . Heart disease Father   . Alcohol abuse Father   .  Hypertension Sister   . Diabetes Sister   . Heart disease Maternal Grandmother        Great GM  . Breast cancer Maternal Grandmother   . Bone cancer Maternal Grandmother   . Breast cancer Maternal Aunt   . Ovarian cancer Maternal Aunt   . Rheum arthritis Sister   . Colon cancer Other        great Aunt  . Rectal cancer Neg Hx   . Stomach cancer Neg  Hx     Past Surgical History:  Procedure Laterality Date  . ABDOMINAL HYSTERECTOMY    . BREAST BIOPSY Left 04/2018   benign  . BREAST BIOPSY Left 2016   benign  . BUNIONECTOMY Right 10/04/2014   Procedure:  [facility] [place], Emmaline Life;  Surgeon: Trula Slade, DPM;  Location: [place];  Service: Podiatry;  Laterality: Right;  . COLONOSCOPY WITH PROPOFOL  02/13/2013  . CYSTOSCOPY N/A 07/07/2017   Procedure: CYSTOSCOPY;  Surgeon: Aletha Halim, MD;  Location: [facility];  Service: Gynecology;  Laterality: N/A;  . DUPUYTREN / PALMAR FASCIOTOMY Right 07/18/2013   exc. of multiple nodules  . 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 HAMMER TOE SURGERY Right 10/04/2014   Procedure: HAMMER TOE REPAIR 4TH  AND 5TH  RIGHT FOOT  fourth toe fixation with k wire;  Surgeon: Trula Slade, DPM;  Location: [place];  Service: Podiatry;  Laterality: Right;  . 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 TUBAL LIGATION    . VAGINAL HYSTERECTOMY N/A 07/07/2017   Procedure: HYSTERECTOMY VAGINAL;  Surgeon: Aletha Halim, MD;  Location: [facility];  Service: Gynecology;  Laterality: N/A;   Social History   Occupational History  . Occupation: cook  Tobacco Use  . Smoking status: Former Smoker    Packs/day: 1.00    Years: 20.00    Pack years: 20.00    Types: Cigarettes    Quit date: 08/24/2012    Years since quitting: 7.7  . Smokeless tobacco: Never Used  Vaping Use  . Vaping Use: Some days  Substance and Sexual Activity  . Alcohol use: Yes    Comment:  occasionally  . Drug use: Yes    Types: Marijuana  . Sexual activity: Yes    Birth control/protection: Surgical

## 2020-05-19 ENCOUNTER — Other Ambulatory Visit: Payer: Self-pay

## 2020-05-19 DIAGNOSIS — R197 Diarrhea, unspecified: Secondary | ICD-10-CM

## 2020-05-19 DIAGNOSIS — R194 Change in bowel habit: Secondary | ICD-10-CM

## 2020-05-23 LAB — OVA AND PARASITE EXAMINATION
CONCENTRATE RESULT:: NONE SEEN
MICRO NUMBER:: 11475655
SPECIMEN QUALITY:: ADEQUATE
TRICHROME RESULT:: NONE SEEN

## 2020-05-26 ENCOUNTER — Encounter (HOSPITAL_COMMUNITY): Payer: Self-pay

## 2020-05-26 ENCOUNTER — Other Ambulatory Visit (HOSPITAL_COMMUNITY): Payer: Self-pay | Admitting: Psychiatry

## 2020-05-26 ENCOUNTER — Telehealth (INDEPENDENT_AMBULATORY_CARE_PROVIDER_SITE_OTHER): Payer: No Payment, Other | Admitting: Psychiatry

## 2020-05-26 ENCOUNTER — Other Ambulatory Visit: Payer: Self-pay

## 2020-05-26 DIAGNOSIS — F331 Major depressive disorder, recurrent, moderate: Secondary | ICD-10-CM | POA: Diagnosis not present

## 2020-05-26 MED ORDER — TRAZODONE HCL 50 MG PO TABS: 50.0000 mg | ORAL_TABLET | Freq: Every evening | ORAL | 2 refills | Status: DC | PRN

## 2020-05-26 MED ORDER — ESCITALOPRAM OXALATE 10 MG PO TABS: 10.0000 mg | ORAL_TABLET | Freq: Every day | ORAL | 2 refills | Status: DC

## 2020-05-26 MED FILL — ESCITALOPRAM 10 MG TABLET: 10 | 30 days supply | Qty: 30 | Fill #0

## 2020-05-26 MED FILL — traZODone HCL 50 MG TABS: 50 | 60 days supply | Qty: 60 | Fill #0

## 2020-05-26 NOTE — Progress Notes (Signed)
Psychiatric Initial Adult Assessment   Patient Identification: Isabel Evans MRN:  [id number] Date of Evaluation:  05/26/2020 Referral Source: PCP Chief Complaint:   Chief Complaint    Anxiety; Depression; Stress     Visit Diagnosis:  Major depressive disorder, General anxiety disorder  History of Present Illness:   50 yo female with multiple, chronic medical issues and many stresses in a short period of time increasing her depression and anxiety.  Current depression level is at an 8/10, no suicidal ideations, moderate to high anxiety, few panic attacks at times where she feels her "heart flying out of my chest".  Her children's stepmother who has been in their lives for 24 years died over Christmas r/t to complications from [place] and her only daughter lost her baby at 4.5 months in her pregnancy.  This along with the stress of managing her mother's multiple appointments has increased her depression and anxiety.  Relaxation which is at a minimal at this time makes it better.  Symptoms are worse in the evening and nighttime.  No suicidal/homicidal ideations, hallucinations, or substance abuse.  Appetite is fair, sleep varies with the stress.  Trazodone 25 mg "works sometimes and doesn't other times", increased to 50 mg.  Client completely off of Effexor and Cymbalta, discussed medication options and decided on Lexapro to assist with her depression and anxiety along with her hot flashes from menopause.  She is pursuing therapy to assist her through the multiple stressors, evaluate in 2 weeks for medication initiation and any side effects.  Associated Signs/Symptoms: Depression Symptoms:  depressed mood, anxiety, panic attacks, (Hypo) Manic Symptoms:  none Anxiety Symptoms:  Excessive Worry, Panic Symptoms, Psychotic Symptoms:  none PTSD Symptoms: NA  Past Psychiatric History: depression and anxiety  Previous Psychotropic Medications: Yes   Substance Abuse History in the last 12  months:  Yes.    Consequences of Substance Abuse: NA  Past Medical History:  Past Medical History:  Diagnosis Date  . Anemia 11/24/2017  . Anxiety   . Bone spur    cervical spine  . Chronic kidney disease 04/2017   kidney infections  . Depression   . Deviated nasal septum    states is unable to lie flat, because her nose will become congested and she can stop breathing  . Diabetes mellitus without complication ([place])   . Dysrhythmia    heart flutters occasionally  . Fibromyalgia   . Fibromyalgia   . GERD (gastroesophageal reflux disease)   . Hallux abductovalgus with bunions 09/2014   right   . Hammertoe 09/2014   right 4th, 5th  . History of stomach ulcers   . Hyperlipidemia   . Hypertension    states is under control with med., has been on med. x 8 mos.  . IBS (irritable bowel syndrome)    no current med.  . Osteoarthritis    hands, bilateral hips  . Peripheral vascular disease (HCC)    occasional swelling in both legs  . Sinus headache     Past Surgical History:  Procedure Laterality Date  . ABDOMINAL HYSTERECTOMY    . BREAST BIOPSY Left 04/2018   benign  . BREAST BIOPSY Left 2016   benign  . BUNIONECTOMY Right 10/04/2014   Procedure:  Altamese [place], Emmaline Life;  Surgeon: Trula Slade, DPM;  Location: [place];  Service: Podiatry;  Laterality: Right;  . COLONOSCOPY WITH PROPOFOL  02/13/2013  . CYSTOSCOPY N/A 07/07/2017   Procedure: CYSTOSCOPY;  Surgeon: Aletha Halim,  MD;  Location: [facility];  Service: Gynecology;  Laterality: N/A;  . DUPUYTREN / PALMAR FASCIOTOMY Right 07/18/2013   exc. of multiple nodules  . 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 HAMMER TOE SURGERY Right 10/04/2014   Procedure: HAMMER TOE REPAIR 4TH  AND 5TH  RIGHT FOOT  fourth toe fixation with k wire;  Surgeon: Trula Slade, DPM;  Location: [place];  Service: Podiatry;  Laterality: Right;  . LEG SURGERY Left     as a child; near amputation of leg  . TUBAL LIGATION    . VAGINAL HYSTERECTOMY N/A 07/07/2017   Procedure: HYSTERECTOMY VAGINAL;  Surgeon: Aletha Halim, MD;  Location: [facility];  Service: Gynecology;  Laterality: N/A;    Family Psychiatric History: see below, mother and sister with depression and anxiety  Family History:  Family History  Problem Relation Age of Onset  . Hypertension Mother   . Diabetes Mother   . Heart disease Mother   . Hypertension Father   . Diabetes Father   . Prostate cancer Father   . Heart disease Father   . Alcohol abuse Father   . Hypertension Sister   . Diabetes Sister   . Heart disease Maternal Grandmother        Great GM  . Breast cancer Maternal Grandmother   . Bone cancer Maternal Grandmother   . Breast cancer Maternal Aunt   . Ovarian cancer Maternal Aunt   . Rheum arthritis Sister   . Colon cancer Other        great Aunt  . Rectal cancer Neg Hx   . Stomach cancer Neg Hx     Social History:   Social History   Socioeconomic History  . Marital status: Single    Spouse name: Not on file  . Number of children: 3  . Years of education: Not on file  . Highest education level: 7th grade  Occupational History  . Occupation: cook  Tobacco Use  . Smoking status: Former Smoker    Packs/day: 1.00    Years: 20.00    Pack years: 20.00    Types: Cigarettes    Quit date: 08/24/2012    Years since quitting: 7.7  . Smokeless tobacco: Never Used  Vaping Use  . Vaping Use: Some days  Substance and Sexual Activity  . Alcohol use: Yes    Comment: occasionally  . Drug use: Yes    Types: Marijuana  . Sexual activity: Yes    Birth control/protection: Surgical  Other Topics Concern  . Not on file  Social History Narrative  . Not on file   Social Determinants of Health   Financial Resource Strain: Not on file  Food Insecurity: Not on file  Transportation Needs: No Transportation Needs  . Lack of Transportation (Medical): No  . Lack of  Transportation (Non-Medical): No  Physical Activity: Not on file  Stress: Not on file  Social Connections: Not on file    Additional Social History: lives with supportive partner, works part-time  Allergies:  No Known Allergies  Metabolic Disorder Labs: Lab Results  Component Value Date   HGBA1C 5.3 01/04/2020   No results found for: PROLACTIN Lab Results  Component Value Date   CHOL 147 08/24/2019   TRIG 87 08/24/2019   HDL 69 08/24/2019   CHOLHDL 2.1 08/24/2019   LDLCALC 62 08/24/2019   LDLCALC 60 07/06/2019   Lab Results  Component Value Date   TSH 0.606 01/04/2020  Therapeutic Level Labs: No results found for: LITHIUM No results found for: CBMZ No results found for: VALPROATE  Current Medications: Current Outpatient Medications  Medication Sig Dispense Refill  . escitalopram (LEXAPRO) 10 MG tablet Take 1 tablet (10 mg total) by mouth daily. 30 tablet 2  . amLODipine (NORVASC) 10 MG tablet Take 1 tablet (10 mg total) by mouth daily. 30 tablet 0  . clonazePAM (KLONOPIN) 0.5 MG tablet Take 1 bid prn anxiety.  Use sparingly 30 tablet 1  . diclofenac (VOLTAREN) 50 MG EC tablet TAKE 1 TABLET (50 MG TOTAL) BY MOUTH 2 (TWO) TIMES DAILY. AFTER A MEAL AS NEEDED FOR PAIN 60 tablet 0  . dicyclomine (BENTYL) 10 MG capsule TAKE 1 CAPSULE BY MOUTH 4 TIMES DAILY AS NEEDED FOR SPASMS. 60 capsule 2  . diphenhydrAMINE (BENADRYL) 25 MG tablet Take 25 mg by mouth daily as needed for allergies.    . fluticasone (FLONASE) 50 MCG/ACT nasal spray Place 2 sprays into both nostrils daily. 16 g 11  . gabapentin (NEURONTIN) 300 MG capsule TAKE 1 CAPSULE BY MOUTH 2 TIMES DAILY AND 2 CAPSULES AT BEDTIME. 120 capsule 0  . metFORMIN (GLUCOPHAGE) 500 MG tablet Take 1 tablet (500 mg total) by mouth daily with breakfast. 90 tablet 0  . omeprazole (PRILOSEC) 20 MG capsule TAKE 2 CAPSULES (40 MG TOTAL) BY MOUTH DAILY. 60 capsule 2  . ondansetron (ZOFRAN-ODT) 8 MG disintegrating tablet TAKE 1 TABLET  (8 MG TOTAL) BY MOUTH DAILY AS NEEDED FOR NAUSEA OR VOMITING. 20 tablet 2  . rosuvastatin (CRESTOR) 20 MG tablet Take 1 tablet (20 mg total) by mouth daily. To lower cholesterol 30 tablet 2  . tiZANidine (ZANAFLEX) 4 MG tablet Take 1 tablet (4 mg total) by mouth every 8 (eight) hours as needed for muscle spasms. Start with 1/2 pill (2 mg) as patient can cause drowsiness 90 tablet 5  . traMADol (ULTRAM) 50 MG tablet TAKE 1 TABLET (50 MG TOTAL) BY MOUTH 3 (THREE) TIMES DAILY. AS NEEDED FOR PAIN. (30 DAY SUPPLY) 90 tablet 5  . traZODone (DESYREL) 50 MG tablet Take 1 tablet (50 mg total) by mouth at bedtime as needed for sleep (make repeat once in an hour if not asleep). 60 tablet 2   No current facility-administered medications for this visit.    Musculoskeletal: Strength & Muscle Tone: within normal limits Gait & Station: normal Patient leans: N/A  Psychiatric Specialty Exam: Review of Systems  Psychiatric/Behavioral: Positive for dysphoric mood and sleep disturbance. The patient is nervous/anxious.   All other systems reviewed and are negative.   Last menstrual period 07/04/2017.There is no height or weight on file to calculate BMI.  General Appearance: Casual  Eye Contact:  Good  Speech:  Normal Rate  Volume:  Normal  Mood:  Anxious and Depressed  Affect:  Congruent  Thought Process:  Coherent and Descriptions of Associations: Intact  Orientation:  Full (Time, Place, and Person)  Thought Content:  WDL and Logical  Suicidal Thoughts:  No  Homicidal Thoughts:  No  Memory:  Immediate;   Good Recent;   Good Remote;   Good  Judgement:  Good  Insight:  Good  Psychomotor Activity:  Normal  Concentration:  Concentration: Good and Attention Span: Good  Recall:  Good  Fund of Knowledge:Good  Language: Good  Akathisia:  No  Handed:  Right  AIMS (if indicated):  not needed  Assets:  Housing Intimacy Leisure Time Resilience Social Support  ADL's:  Intact  Cognition:  WNL  Sleep:   Fair   Screenings: GAD-7   Flowsheet Row Video Visit from 05/05/2020 in [facility] Office Visit from 01/04/2020 in [place] Office Visit from 07/06/2019 in [place] Office Visit from 09/06/2018 in [place] Office Visit from 04/13/2018 in [place]  Total GAD-7 Score 20 16 17 20 19     PHQ2-9   [place] from 03/24/2020 in [place] Office Visit from 02/28/2020 in [place] Office Visit from 01/04/2020 in [place] Office Visit from 07/06/2019 in [place] Office Visit from 09/06/2018 in [place]  PHQ-2 Total Score 1 5 4 4 6   PHQ-9 Total Score 4 16 15 16 18       Assessment and Plan:  General anxiety disorder and panic disorder: -Continue Klonopin 0.5 mg daily PRN per PCP if warranted for panic attacks -Continue gabapentin 300 mg TID for anxiety and neuropathic pain  Major Depressive Disorder, recurrent, moderate -Tapered off of her Cymbalta and Effexor, patient preference -Started Lexapro 10 mg daily -Establish and start therapy at [facility] or another facility -Reevaluate in 2 weeks  Insomnia: -Increased Trazodone 25 mg to 50 mg at bedtime PRN, repeat once in an hour if not effective  Virtual Visit via Video Note  I connected with [name] on 05/26/20 at  8:30 AM EST by a video enabled telemedicine application and verified that I am speaking with the correct person using two identifiers.  Location: Patient: home Provider: home   I discussed the limitations of evaluation and management by telemedicine and the availability of in person appointments. The patient expressed understanding and agreed to proceed.  Follow Up Instructions: Follow up in 2  weeks   I discussed the assessment and treatment plan with the patient. The patient was provided an opportunity to ask questions and all were answered. The patient agreed with the plan and demonstrated an understanding of the instructions.   The patient was advised to call back or seek an in-person evaluation if the symptoms worsen or if the condition fails to improve as anticipated.  I provided 30 minutes of non-face-to-face time during this encounter.   Waylan Boga, NP    Waylan Boga, NP 2/7/20228:56 AM

## 2020-05-28 ENCOUNTER — Other Ambulatory Visit: Payer: Self-pay

## 2020-05-28 ENCOUNTER — Encounter: Payer: Self-pay | Admitting: Physician Assistant

## 2020-05-28 ENCOUNTER — Other Ambulatory Visit: Payer: Self-pay | Admitting: Physician Assistant

## 2020-05-28 ENCOUNTER — Ambulatory Visit: Payer: Self-pay | Attending: Physician Assistant | Admitting: Physician Assistant

## 2020-05-28 VITALS — BP 112/72 | HR 82 | Ht 71.0 in | Wt 157.0 lb

## 2020-05-28 DIAGNOSIS — I10 Essential (primary) hypertension: Secondary | ICD-10-CM

## 2020-05-28 DIAGNOSIS — M542 Cervicalgia: Secondary | ICD-10-CM

## 2020-05-28 DIAGNOSIS — F17200 Nicotine dependence, unspecified, uncomplicated: Secondary | ICD-10-CM

## 2020-05-28 DIAGNOSIS — M797 Fibromyalgia: Secondary | ICD-10-CM

## 2020-05-28 DIAGNOSIS — R7303 Prediabetes: Secondary | ICD-10-CM

## 2020-05-28 DIAGNOSIS — K582 Mixed irritable bowel syndrome: Secondary | ICD-10-CM

## 2020-05-28 DIAGNOSIS — F411 Generalized anxiety disorder: Secondary | ICD-10-CM

## 2020-05-28 DIAGNOSIS — M545 Low back pain, unspecified: Secondary | ICD-10-CM

## 2020-05-28 DIAGNOSIS — M2550 Pain in unspecified joint: Secondary | ICD-10-CM

## 2020-05-28 DIAGNOSIS — R232 Flushing: Secondary | ICD-10-CM

## 2020-05-28 DIAGNOSIS — E782 Mixed hyperlipidemia: Secondary | ICD-10-CM

## 2020-05-28 MED ORDER — DICYCLOMINE HCL 10 MG PO CAPS
ORAL_CAPSULE | ORAL | 2 refills | Status: DC
Start: 2020-05-28 — End: 2020-05-28

## 2020-05-28 MED ORDER — ROSUVASTATIN CALCIUM 20 MG PO TABS: 20.0000 mg | ORAL_TABLET | Freq: Every day | ORAL | 2 refills | Status: DC

## 2020-05-28 MED ORDER — GABAPENTIN 300 MG PO CAPS: ORAL_CAPSULE | ORAL | 2 refills | Status: DC

## 2020-05-28 MED ORDER — DICLOFENAC SODIUM 50 MG PO TBEC
50.0000 mg | DELAYED_RELEASE_TABLET | Freq: Two times a day (BID) | ORAL | 0 refills | Status: DC
Start: 2020-05-28 — End: 2020-07-11

## 2020-05-28 MED ORDER — FLUTICASONE PROPIONATE 50 MCG/ACT NA SUSP
2.0000 | Freq: Every day | NASAL | 11 refills | Status: DC
Start: 2020-05-28 — End: 2020-05-28

## 2020-05-28 MED ORDER — TIZANIDINE HCL 4 MG PO TABS: 4.0000 mg | ORAL_TABLET | Freq: Three times a day (TID) | ORAL | 0 refills | Status: DC | PRN

## 2020-05-28 MED ORDER — METFORMIN HCL 500 MG PO TABS: 500.0000 mg | ORAL_TABLET | Freq: Every day | ORAL | 0 refills | Status: DC

## 2020-05-28 MED ORDER — AMLODIPINE BESYLATE 5 MG PO TABS: 5.0000 mg | ORAL_TABLET | Freq: Every day | ORAL | 3 refills | Status: DC

## 2020-05-28 MED FILL — DICYCLOMINE 10 MG CAPSULE: 10 | 30 days supply | Qty: 60 | Fill #0

## 2020-05-28 MED FILL — GABAPENTIN 300 MG CAPSULE: 300 | 30 days supply | Qty: 120 | Fill #0

## 2020-05-28 MED FILL — ROSUVASTATIN CALCIUM 20 MG: 20 | 30 days supply | Qty: 30 | Fill #0

## 2020-05-28 NOTE — Progress Notes (Signed)
Patient ID: Isabel Evans, female   DOB: 12-23-1970, 50 y.o.   MRN: [id number]   Nile Dorning, is a 49 y.o. female  LGX:[id number]  CXK:[id number]  DOB - Feb 09, 1971  Subjective:  Chief Complaint and HPI: Isabel Evans is a 50 y.o. female here today for med RF.  BP has been running low at times but when she has stopped meds, "it shoots up high."  She is feeling fatigued.  She started having hot flashes at night about 2 months ago and now having them throughout the day too.    Wants to get CXR bc of smoking history, but she is down to smoking 2-3 cigs per week  ROS:   Constitutional:  No f/c, No night sweats, No unexplained weight loss. EENT:  No vision changes, No blurry vision, No hearing changes. No mouth, throat, or ear problems.  Respiratory: No cough, No SOB Cardiac: No CP, no palpitations GI:  No abd pain, No N/V/D. GU: No Urinary s/sx Musculoskeletal: chronic pain Neuro: No headache, no dizziness, no motor weakness.  Skin: No rash Endocrine:  No polydipsia. No polyuria.  Psych: Denies SI/HI  No problems updated.  ALLERGIES: No Known Allergies  PAST MEDICAL HISTORY: Past Medical History:  Diagnosis Date  . Anemia 11/24/2017  . Anxiety   . Bone spur    cervical spine  . Chronic kidney disease 04/2017   kidney infections  . Depression   . Deviated nasal septum    states is unable to lie flat, because her nose will become congested and she can stop breathing  . Diabetes mellitus without complication ([place])   . Dysrhythmia    heart flutters occasionally  . Fibromyalgia   . Fibromyalgia   . GERD (gastroesophageal reflux disease)   . Hallux abductovalgus with bunions 09/2014   right   . Hammertoe 09/2014   right 4th, 5th  . History of stomach ulcers   . Hyperlipidemia   . Hypertension    states is under control with med., has been on med. x 8 mos.  . IBS (irritable bowel syndrome)    no current med.  . Osteoarthritis    hands, bilateral hips  .  Peripheral vascular disease (HCC)    occasional swelling in both legs  . Sinus headache     MEDICATIONS AT HOME: Prior to Admission medications   Medication Sig Start Date End Date Taking? Authorizing Provider  amLODipine (NORVASC) 5 MG tablet Take 1 tablet (5 mg total) by mouth daily. 05/28/20  Yes McClung, Levada Dy M, PA-C  clonazePAM (KLONOPIN) 0.5 MG tablet Take 1 bid prn anxiety.  Use sparingly 03/24/20  Yes McClung, Dionne Bucy, PA-C  diphenhydrAMINE (BENADRYL) 25 MG tablet Take 25 mg by mouth daily as needed for allergies.   Yes [provider]  escitalopram (LEXAPRO) 10 MG tablet Take 1 tablet (10 mg total) by mouth daily. 05/26/20 05/26/21 Yes Lord, Asa Saunas, NP  omeprazole (PRILOSEC) 20 MG capsule TAKE 2 CAPSULES (40 MG TOTAL) BY MOUTH DAILY. 03/17/20  Yes Nicolette Bang, DO  ondansetron (ZOFRAN-ODT) 8 MG disintegrating tablet TAKE 1 TABLET (8 MG TOTAL) BY MOUTH DAILY AS NEEDED FOR NAUSEA OR VOMITING. 01/08/20  Yes Fulp, Cammie, MD  traMADol (ULTRAM) 50 MG tablet TAKE 1 TABLET (50 MG TOTAL) BY MOUTH 3 (THREE) TIMES DAILY. AS NEEDED FOR PAIN. (30 DAY SUPPLY) 01/04/20  Yes Fulp, Cammie, MD  traZODone (DESYREL) 50 MG tablet Take 1 tablet (50 mg total) by mouth at bedtime as needed  for sleep (make repeat once in an hour if not asleep). 05/26/20  Yes Patrecia Pour, NP  diclofenac (VOLTAREN) 50 MG EC tablet Take 1 tablet (50 mg total) by mouth 2 (two) times daily. After a meal as needed for pain 05/28/20   Freeman Caldron M, PA-C  dicyclomine (BENTYL) 10 MG capsule 1 bid prn 05/28/20   Argentina Donovan, PA-C  fluticasone [facility]) 50 MCG/ACT nasal spray Place 2 sprays into both nostrils daily. 05/28/20   Argentina Donovan, PA-C  gabapentin (NEURONTIN) 300 MG capsule TAKE 1 CAPSULE BY MOUTH 2 TIMES DAILY AND 2 CAPSULES AT BEDTIME. 05/28/20   McClung, Dionne Bucy, PA-C  metFORMIN (GLUCOPHAGE) 500 MG tablet Take 1 tablet (500 mg total) by mouth daily with breakfast. 05/28/20   Argentina Donovan, PA-C   rosuvastatin (CRESTOR) 20 MG tablet Take 1 tablet (20 mg total) by mouth daily. To lower cholesterol 05/28/20   Freeman Caldron M, PA-C  tiZANidine (ZANAFLEX) 4 MG tablet Take 1 tablet (4 mg total) by mouth every 8 (eight) hours as needed for muscle spasms. Start with 1/2 pill (2 mg) as patient can cause drowsiness 05/28/20   Argentina Donovan, PA-C     Objective:  EXAM:   Vitals:   05/28/20 1603  BP: 112/72  Pulse: 82  SpO2: 97%  Weight: 157 lb (71.2 kg)  Height: 5\' 11"  (1.803 m)    General appearance : A&OX3. NAD. Non-toxic-appearing HEENT: Atraumatic and Normocephalic.  PERRLA. EOM intact.  Neck: supple, no JVD. No cervical lymphadenopathy. No thyromegaly Chest/Lungs:  Breathing-non-labored, Good air entry bilaterally, breath sounds normal without rales, rhonchi, or wheezing  CVS: S1 S2 regular, no murmurs, gallops, rubs  Extremities: Bilateral Lower Ext shows no edema, both legs are warm to touch with = pulse throughout Neurology:  CN II-XII grossly intact, Non focal.   Psych:  TP linear. J/I WNL. Normal speech. Appropriate eye contact and affect.  Skin:  No Rash  Data Review Lab Results  Component Value Date   HGBA1C 5.3 01/04/2020   HGBA1C 5.5 07/06/2019   HGBA1C 5.4 12/28/2018     Assessment & Plan   1. Pre-diabetes - metFORMIN (GLUCOPHAGE) 500 MG tablet; Take 1 tablet (500 mg total) by mouth daily with breakfast.  Dispense: 90 tablet; Refill: 0 - Hemoglobin A1c - Comprehensive metabolic panel - CBC with Differential/Platelet  2. Low back pain with radiation - gabapentin (NEURONTIN) 300 MG capsule; TAKE 1 CAPSULE BY MOUTH 2 TIMES DAILY AND 2 CAPSULES AT BEDTIME.  Dispense: 120 capsule; Refill: 2 - tiZANidine (ZANAFLEX) 4 MG tablet; Take 1 tablet (4 mg total) by mouth every 8 (eight) hours as needed for muscle spasms. Start with 1/2 pill (2 mg) as patient can cause drowsiness  Dispense: 90 tablet; Refill: 0  3. Fibromyalgia Wanted tramadol but will need contract  with PCP if appropriate - gabapentin (NEURONTIN) 300 MG capsule; TAKE 1 CAPSULE BY MOUTH 2 TIMES DAILY AND 2 CAPSULES AT BEDTIME.  Dispense: 120 capsule; Refill: 2  4. Mixed hyperlipidemia - rosuvastatin (CRESTOR) 20 MG tablet; Take 1 tablet (20 mg total) by mouth daily. To lower cholesterol  Dispense: 30 tablet; Refill: 2  5. Pain in joint, multiple sites - diclofenac (VOLTAREN) 50 MG EC tablet; Take 1 tablet (50 mg total) by mouth 2 (two) times daily. After a meal as needed for pain  Dispense: 60 tablet; Refill: 0  6. Neck pain - diclofenac (VOLTAREN) 50 MG EC tablet; Take 1 tablet (50 mg  total) by mouth 2 (two) times daily. After a meal as needed for pain  Dispense: 60 tablet; Refill: 0 - tiZANidine (ZANAFLEX) 4 MG tablet; Take 1 tablet (4 mg total) by mouth every 8 (eight) hours as needed for muscle spasms. Start with 1/2 pill (2 mg) as patient can cause drowsiness  Dispense: 90 tablet; Refill: 0  7. Irritable bowel syndrome with both constipation and diarrhea - dicyclomine (BENTYL) 10 MG capsule; 1 bid prn  Dispense: 60 capsule; Refill: 2  8. Generalized anxiety disorder Continue lexapro  9. Smoking Complete cessation advised but congratulated her on cutting way back - DG Chest 2 View; Future  10. Hot flashes - FSH/LH - Comprehensive metabolic panel  11. Hypertension, unspecified type Readings low at times.  Drink 80-100 ounces water daily and will decrease amlodipine fom 10mg  to 5mg .  Record BP daily and bring to next visit - amLODipine (NORVASC) 5 MG tablet; Take 1 tablet (5 mg total) by mouth daily.  Dispense: 90 tablet; Refill: 3 - Comprehensive metabolic panel - CBC with Differential/Platelet   Patient have been counseled extensively about nutrition and exercise  Return in about 4 weeks (around 06/25/2020) for assign PCP and follow up with BP; chronic conditions.  The patient was given clear instructions to go to ER or return to medical center if symptoms don't improve,  worsen or new problems develop. The patient verbalized understanding. The patient was told to call to get lab results if they haven't heard anything in the next week.     Freeman Caldron, PA-C [facility] [place], [place]   05/28/2020, 4:24 PM

## 2020-05-28 NOTE — Patient Instructions (Signed)
Check blood pressure daily and record and drink 80-100 ounces water daily

## 2020-05-29 ENCOUNTER — Other Ambulatory Visit: Payer: Self-pay | Admitting: Physician Assistant

## 2020-05-29 DIAGNOSIS — I10 Essential (primary) hypertension: Secondary | ICD-10-CM

## 2020-05-29 DIAGNOSIS — E871 Hypo-osmolality and hyponatremia: Secondary | ICD-10-CM

## 2020-05-29 DIAGNOSIS — R7303 Prediabetes: Secondary | ICD-10-CM

## 2020-05-29 LAB — FSH/LH
FSH: 55.9 m[IU]/mL
LH: 37.7 m[IU]/mL

## 2020-05-29 LAB — CBC WITH DIFFERENTIAL/PLATELET
Basophils Absolute: 0.1 10*3/uL (ref 0.0–0.2)
Basos: 1 %
EOS (ABSOLUTE): 0.1 10*3/uL (ref 0.0–0.4)
Eos: 2 %
Hematocrit: 35.3 % (ref 34.0–46.6)
Hemoglobin: 11.8 g/dL (ref 11.1–15.9)
Immature Grans (Abs): 0 10*3/uL (ref 0.0–0.1)
Immature Granulocytes: 0 %
Lymphocytes Absolute: 3.1 10*3/uL (ref 0.7–3.1)
Lymphs: 41 %
MCH: 29.8 pg (ref 26.6–33.0)
MCHC: 33.4 g/dL (ref 31.5–35.7)
MCV: 89 fL (ref 79–97)
Monocytes Absolute: 0.5 10*3/uL (ref 0.1–0.9)
Monocytes: 7 %
Neutrophils Absolute: 3.7 10*3/uL (ref 1.4–7.0)
Neutrophils: 49 %
Platelets: 257 10*3/uL (ref 150–450)
RBC: 3.96 x10E6/uL (ref 3.77–5.28)
RDW: 12.7 % (ref 11.7–15.4)
WBC: 7.5 10*3/uL (ref 3.4–10.8)

## 2020-05-29 LAB — COMPREHENSIVE METABOLIC PANEL
ALT: 16 IU/L (ref 0–32)
AST: 17 IU/L (ref 0–40)
Albumin/Globulin Ratio: 1.9 (ref 1.2–2.2)
Albumin: 4.3 g/dL (ref 3.8–4.8)
Alkaline Phosphatase: 59 IU/L (ref 44–121)
BUN/Creatinine Ratio: 15 (ref 9–23)
BUN: 17 mg/dL (ref 6–24)
Bilirubin Total: 0.2 mg/dL (ref 0.0–1.2)
CO2: 24 mmol/L (ref 20–29)
Calcium: 9.1 mg/dL (ref 8.7–10.2)
Chloride: 93 mmol/L — ABNORMAL LOW (ref 96–106)
Creatinine, Ser: 1.1 mg/dL — ABNORMAL HIGH (ref 0.57–1.00)
GFR calc Af Amer: 69 mL/min/{1.73_m2} (ref >59)
GFR calc non Af Amer: 60 mL/min/{1.73_m2} (ref >59)
Globulin, Total: 2.3 g/dL (ref 1.5–4.5)
Glucose: 101 mg/dL — ABNORMAL HIGH (ref 65–99)
Potassium: 4.8 mmol/L (ref 3.5–5.2)
Sodium: 132 mmol/L — ABNORMAL LOW (ref 134–144)
Total Protein: 6.6 g/dL (ref 6.0–8.5)

## 2020-05-29 LAB — HEMOGLOBIN A1C
Est. average glucose Bld gHb Est-mCnc: 123 mg/dL
Hgb A1c MFr Bld: 5.9 % — ABNORMAL HIGH (ref 4.8–5.6)

## 2020-05-30 ENCOUNTER — Other Ambulatory Visit: Payer: Self-pay

## 2020-05-30 ENCOUNTER — Ambulatory Visit (HOSPITAL_COMMUNITY)
Admission: RE | Admit: 2020-05-30 | Discharge: 2020-05-30 | Disposition: A | Discharge status: Home / Self Care | Payer: Self-pay | Source: Ambulatory Visit | Attending: Physician Assistant | Admitting: Physician Assistant

## 2020-05-30 DIAGNOSIS — F17200 Nicotine dependence, unspecified, uncomplicated: Secondary | ICD-10-CM | POA: Insufficient documentation

## 2020-06-03 ENCOUNTER — Encounter: Payer: Self-pay | Admitting: Physician Assistant

## 2020-06-04 ENCOUNTER — Ambulatory Visit (HOSPITAL_COMMUNITY)
Admission: RE | Admit: 2020-06-04 | Discharge: 2020-06-04 | Disposition: A | Discharge status: Home / Self Care | Payer: Self-pay | Source: Ambulatory Visit | Attending: Orthopaedic Surgery | Admitting: Orthopaedic Surgery

## 2020-06-04 ENCOUNTER — Other Ambulatory Visit: Payer: Self-pay

## 2020-06-04 DIAGNOSIS — M5020 Other cervical disc displacement, unspecified cervical region: Secondary | ICD-10-CM | POA: Insufficient documentation

## 2020-06-05 ENCOUNTER — Encounter: Payer: Self-pay | Admitting: *Deleted

## 2020-06-09 ENCOUNTER — Other Ambulatory Visit: Payer: Self-pay

## 2020-06-09 ENCOUNTER — Encounter (HOSPITAL_COMMUNITY): Payer: Self-pay

## 2020-06-09 ENCOUNTER — Other Ambulatory Visit (HOSPITAL_COMMUNITY): Payer: Self-pay | Admitting: Psychiatry

## 2020-06-09 ENCOUNTER — Telehealth (INDEPENDENT_AMBULATORY_CARE_PROVIDER_SITE_OTHER): Payer: No Payment, Other | Admitting: Psychiatry

## 2020-06-09 DIAGNOSIS — F331 Major depressive disorder, recurrent, moderate: Secondary | ICD-10-CM | POA: Diagnosis not present

## 2020-06-09 MED ORDER — ESCITALOPRAM OXALATE 10 MG PO TABS: 20.0000 mg | ORAL_TABLET | Freq: Every day | ORAL | 0 refills | Status: DC

## 2020-06-09 MED ORDER — TRAZODONE HCL 50 MG PO TABS
50.0000 mg | ORAL_TABLET | Freq: Every evening | ORAL | 2 refills | Status: DC | PRN
Start: 2020-06-09 — End: 2020-07-07

## 2020-06-09 NOTE — Progress Notes (Signed)
Psychiatric Initial Adult Assessment   Patient Identification: Isabel Evans MRN:  [id number] Date of Evaluation:  06/09/2020 Referral Source: PCP Chief Complaint:  Depression and anxiety  Visit Diagnosis:  Major depressive disorder, General anxiety disorder  History of Present Illness:   50 yo female with multiple, chronic medical issues and many stresses in a short period of time increasing her depression and anxiety.  Reports feeling "little better.  Medications are helping me sleep better, no more nightmares.  Feel a lot better."  Her menopausal symptoms of headaches have decreased significantly, hot flashes with sweating occurs frequently.  She did go to see her doctor who did lab work and determined that she is premenopausal, will follow and assist her with the symptoms.  Her chronic back pain related to degenerative disc disease is also being managed and has a MRI scheduled, pain is at a 5 out of 10 constant.  Her sleep improvement has helped.  Appetite has increased with a 3 pound weight gain that she is excited about as she had lost a significant amount weight over the fall and summer.  Discussed and encouraged therapy along with easy stretching yoga approved by her MD mom who also encouraged her to do yoga as well as acupuncture.  Low to moderate level of depression and anxiety, no suicidal/homicidal ideations.  Managing her stress better than her previous visit and denies any side effects from her medications, follow-up in 1 month.  Associated Signs/Symptoms: Depression Symptoms:  depressed mood, anxiety, panic attacks, (Hypo) Manic Symptoms:  none Anxiety Symptoms:  Excessive Worry, Panic Symptoms, Psychotic Symptoms:  none PTSD Symptoms: NA  Past Psychiatric History: depression and anxiety  Previous Psychotropic Medications: Yes   Substance Abuse History in the last 12 months:  Yes.    Consequences of Substance Abuse: NA  Past Medical History:  Past Medical History:   Diagnosis Date  . Anemia 11/24/2017  . Anxiety   . Bone spur    cervical spine  . Chronic kidney disease 04/2017   kidney infections  . Depression   . Deviated nasal septum    states is unable to lie flat, because her nose will become congested and she can stop breathing  . Diabetes mellitus without complication ([place])   . Dysrhythmia    heart flutters occasionally  . Fibromyalgia   . Fibromyalgia   . GERD (gastroesophageal reflux disease)   . Hallux abductovalgus with bunions 09/2014   right   . Hammertoe 09/2014   right 4th, 5th  . History of stomach ulcers   . Hyperlipidemia   . Hypertension    states is under control with med., has been on med. x 8 mos.  . IBS (irritable bowel syndrome)    no current med.  . Osteoarthritis    hands, bilateral hips  . Peripheral vascular disease (HCC)    occasional swelling in both legs  . Sinus headache     Past Surgical History:  Procedure Laterality Date  . ABDOMINAL HYSTERECTOMY    . BREAST BIOPSY Left 04/2018   benign  . BREAST BIOPSY Left 2016   benign  . BUNIONECTOMY Right 10/04/2014   Procedure:  Altamese Kief, Emmaline Life;  Surgeon: Trula Slade, DPM;  Location: [facility];  Service: Podiatry;  Laterality: Right;  . COLONOSCOPY WITH PROPOFOL  02/13/2013  . CYSTOSCOPY N/A 07/07/2017   Procedure: CYSTOSCOPY;  Surgeon: Aletha Halim, MD;  Location: [facility];  Service: Gynecology;  Laterality: N/A;  . DUPUYTREN /  PALMAR FASCIOTOMY Right 07/18/2013   exc. of multiple nodules  . 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 HAMMER TOE SURGERY Right 10/04/2014   Procedure: HAMMER TOE REPAIR 4TH  AND 5TH  RIGHT FOOT  fourth toe fixation with k wire;  Surgeon: Trula Slade, DPM;  Location: [facility];  Service: Podiatry;  Laterality: Right;  . 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 TUBAL LIGATION    . VAGINAL HYSTERECTOMY N/A 07/07/2017    Procedure: HYSTERECTOMY VAGINAL;  Surgeon: Aletha Halim, MD;  Location: [facility];  Service: Gynecology;  Laterality: N/A;    Family Psychiatric History: see below, mother and sister with depression and anxiety  Family History:  Family History  Problem Relation Age of Onset  . Hypertension Mother   . Diabetes Mother   . Heart disease Mother   . Hypertension Father   . Diabetes Father   . Prostate cancer Father   . Heart disease Father   . Alcohol abuse Father   . Hypertension Sister   . Diabetes Sister   . Heart disease Maternal Grandmother        Great GM  . Breast cancer Maternal Grandmother   . Bone cancer Maternal Grandmother   . Breast cancer Maternal Aunt   . Ovarian cancer Maternal Aunt   . Rheum arthritis Sister   . Colon cancer Other        great Aunt  . Rectal cancer Neg Hx   . Stomach cancer Neg Hx     Social History:   Social History   Socioeconomic History  . Marital status: Single    Spouse name: Not on file  . Number of children: 3  . Years of education: Not on file  . Highest education level: 7th grade  Occupational History  . Occupation: cook  Tobacco Use  . Smoking status: Former Smoker    Packs/day: 1.00    Years: 20.00    Pack years: 20.00    Types: Cigarettes    Quit date: 08/24/2012    Years since quitting: 7.7  . Smokeless tobacco: Never Used  Vaping Use  . Vaping Use: Some days  Substance and Sexual Activity  . Alcohol use: Yes    Comment: occasionally  . Drug use: Yes    Types: Marijuana  . Sexual activity: Yes    Birth control/protection: Surgical  Other Topics Concern  . Not on file  Social History Narrative  . Not on file   Social Determinants of Health   Financial Resource Strain: Not on file  Food Insecurity: Not on file  Transportation Needs: No Transportation Needs  . Lack of Transportation (Medical): No  . Lack of Transportation (Non-Medical): No  Physical Activity: Not on file  Stress: Not on file  Social  Connections: Not on file    Additional Social History: lives with supportive partner, works part-time  Allergies:  No Known Allergies  Metabolic Disorder Labs: Lab Results  Component Value Date   HGBA1C 5.9 (H) 05/28/2020   No results found for: PROLACTIN Lab Results  Component Value Date   CHOL 147 08/24/2019   TRIG 87 08/24/2019   HDL 69 08/24/2019   CHOLHDL 2.1 08/24/2019   [place] 62 08/24/2019   [place] 60 07/06/2019   Lab Results  Component Value Date   TSH 0.606 01/04/2020    Therapeutic Level Labs: No results found for: LITHIUM No results found  for: CBMZ No results found for: VALPROATE  Current Medications: Current Outpatient Medications  Medication Sig Dispense Refill  . amLODipine (NORVASC) 5 MG tablet Take 1 tablet (5 mg total) by mouth daily. 90 tablet 3  . clonazePAM (KLONOPIN) 0.5 MG tablet Take 1 bid prn anxiety.  Use sparingly 30 tablet 1  . diclofenac (VOLTAREN) 50 MG EC tablet Take 1 tablet (50 mg total) by mouth 2 (two) times daily. After a meal as needed for pain 60 tablet 0  . dicyclomine (BENTYL) 10 MG capsule 1 bid prn 60 capsule 2  . diphenhydrAMINE (BENADRYL) 25 MG tablet Take 25 mg by mouth daily as needed for allergies.    Marland Kitchen escitalopram (LEXAPRO) 10 MG tablet Take 1 tablet (10 mg total) by mouth daily. 30 tablet 2  . fluticasone (FLONASE) 50 MCG/ACT nasal spray Place 2 sprays into both nostrils daily. 16 g 11  . gabapentin (NEURONTIN) 300 MG capsule TAKE 1 CAPSULE BY MOUTH 2 TIMES DAILY AND 2 CAPSULES AT BEDTIME. 120 capsule 2  . metFORMIN (GLUCOPHAGE) 500 MG tablet Take 1 tablet (500 mg total) by mouth daily with breakfast. 90 tablet 0  . omeprazole (PRILOSEC) 20 MG capsule TAKE 2 CAPSULES (40 MG TOTAL) BY MOUTH DAILY. 60 capsule 2  . ondansetron (ZOFRAN-ODT) 8 MG disintegrating tablet TAKE 1 TABLET (8 MG TOTAL) BY MOUTH DAILY AS NEEDED FOR NAUSEA OR VOMITING. 20 tablet 2  . rosuvastatin (CRESTOR) 20 MG tablet Take 1 tablet (20 mg total) by  mouth daily. To lower cholesterol 30 tablet 2  . tiZANidine (ZANAFLEX) 4 MG tablet Take 1 tablet (4 mg total) by mouth every 8 (eight) hours as needed for muscle spasms. Start with 1/2 pill (2 mg) as patient can cause drowsiness 90 tablet 0  . traMADol (ULTRAM) 50 MG tablet TAKE 1 TABLET (50 MG TOTAL) BY MOUTH 3 (THREE) TIMES DAILY. AS NEEDED FOR PAIN. (30 DAY SUPPLY) 90 tablet 5  . traZODone (DESYREL) 50 MG tablet Take 1 tablet (50 mg total) by mouth at bedtime as needed for sleep (make repeat once in an hour if not asleep). 60 tablet 2   No current facility-administered medications for this visit.    Musculoskeletal: Strength & Muscle Tone: within normal limits Gait & Station: normal Patient leans: N/A  Psychiatric Specialty Exam: Review of Systems  Psychiatric/Behavioral: Positive for dysphoric mood. The patient is nervous/anxious.   All other systems reviewed and are negative.   Last menstrual period 07/04/2017.There is no height or weight on file to calculate BMI.  General Appearance: Casual  Eye Contact:  Good  Speech:  Normal Rate  Volume:  Normal  Mood:  Anxious and Depressed  Affect:  Congruent  Thought Process:  Coherent and Descriptions of Associations: Intact  Orientation:  Full (Time, Place, and Person)  Thought Content:  WDL and Logical  Suicidal Thoughts:  No  Homicidal Thoughts:  No  Memory:  Immediate;   Good Recent;   Good Remote;   Good  Judgement:  Good  Insight:  Good  Psychomotor Activity:  Normal  Concentration:  Concentration: Good and Attention Span: Good  Recall:  Good  Fund of Knowledge:Good  Language: Good  Akathisia:  No  Handed:  Right  AIMS (if indicated):  not needed  Assets:  Housing Intimacy Leisure Time Resilience Social Support  ADL's:  Intact  Cognition: WNL  Sleep:  Fair   Screenings: GAD-7   Flowsheet Row Video Visit [facility]/05/2020 in [facility]  Office Visit from 01/04/2020 in [facility] Office Visit from 07/06/2019 in [place] Office Visit from 09/06/2018 in [place] Office Visit from 04/13/2018 in [place]  Total GAD-7 Score 20 16 17 20 19     PHQ2-9   [place] from 03/24/2020 in [facility] Office Visit from 02/28/2020 in [facility] Office Visit from 01/04/2020 in [place] Office Visit from 07/06/2019 in [place] Office Visit from 09/06/2018 in [place]  PHQ-2 Total Score 1 5 4 4 6   PHQ-9 Total Score 4 16 15 16 18     [place] from 12/26/2017 in [place] Office Visit from 07/21/2017 in [facility] Low Risk No Risk      Assessment and Plan:  General anxiety disorder and panic disorder: -Continue Klonopin 0.5 mg daily PRN per PCP if warranted for panic attacks -Continue gabapentin 300 mg TID for anxiety and neuropathic pain  Major Depressive Disorder, recurrent, moderate -Increased Lexapro 10 mg daily to 20 mg daily -Establish and start therapy at [facility] or another facility -Reevaluate in 1 month  Chronic back Pain: -Continue care with MD -Encouraged yoga and acupuncture recommended by her MD  Insomnia: -Continue Trazodone 50 mg at bedtime PRN, repeat once in an hour if not effective  Virtual Visit via Video Note  I connected with [name] on 06/09/20 at  9:00 AM EST by a video enabled telemedicine application and verified that I am speaking with the correct person using two identifiers.  Location: Patient: home Provider: home   I discussed the limitations of evaluation and management by telemedicine and the availability of in person appointments.  The patient expressed understanding and agreed to proceed.  Follow Up Instructions: Follow up in 1 month   I discussed the assessment and treatment plan with the patient. The patient was provided an opportunity to ask questions and all were answered. The patient agreed with the plan and demonstrated an understanding of the instructions.   The patient was advised to call back or seek an in-person evaluation if the symptoms worsen or if the condition fails to improve as anticipated.  I provided 20 minutes of non-face-to-face time during this encounter.   Waylan Boga, NP    Waylan Boga, NP 2/21/20228:57 AM

## 2020-06-11 ENCOUNTER — Other Ambulatory Visit: Payer: Self-pay | Admitting: Physician Assistant

## 2020-06-11 DIAGNOSIS — R232 Flushing: Secondary | ICD-10-CM

## 2020-06-11 DIAGNOSIS — N951 Menopausal and female climacteric states: Secondary | ICD-10-CM

## 2020-06-12 ENCOUNTER — Other Ambulatory Visit: Payer: Self-pay | Admitting: Family Medicine

## 2020-06-12 ENCOUNTER — Other Ambulatory Visit: Payer: Self-pay | Admitting: Internal Medicine

## 2020-06-12 DIAGNOSIS — K219 Gastro-esophageal reflux disease without esophagitis: Secondary | ICD-10-CM

## 2020-06-12 MED FILL — DICLOFENAC SOD EC 50 MG TAB: 50 | 30 days supply | Qty: 60 | Fill #0

## 2020-06-12 MED FILL — DICYCLOMINE 10 MG CAPSULE: 10 | 30 days supply | Qty: 60 | Fill #0

## 2020-06-12 MED FILL — GABAPENTIN 300 MG CAPSULE: 300 | 30 days supply | Qty: 120 | Fill #0

## 2020-06-12 MED FILL — traZODone HCL 50 MG TABS: 50 | 30 days supply | Qty: 15 | Fill #1

## 2020-06-12 MED FILL — traMADol HCL 50 MG TABS: 50 | 30 days supply | Qty: 90 | Fill #5

## 2020-06-12 MED FILL — ROSUVASTATIN CALCIUM 20 MG: 20 | 30 days supply | Qty: 30 | Fill #0

## 2020-06-12 MED FILL — ESCITALOPRAM 10 MG TABLET: 10 | 30 days supply | Qty: 60 | Fill #0

## 2020-06-12 MED FILL — AMLODIPINE BESYLATE 5 MG TA: 5 | 30 days supply | Qty: 30 | Fill #0

## 2020-06-12 MED FILL — tiZANidine HCL 4 MG TABS: 4 | 30 days supply | Qty: 90 | Fill #0

## 2020-06-12 MED FILL — ?OMEPRAZOLE 20 MG CPDR: 20 | 30 days supply | Qty: 60 | Fill #0

## 2020-06-13 ENCOUNTER — Other Ambulatory Visit: Payer: Self-pay | Admitting: Pharmacist

## 2020-06-13 DIAGNOSIS — I10 Essential (primary) hypertension: Secondary | ICD-10-CM

## 2020-06-13 MED ORDER — AMLODIPINE BESYLATE 5 MG PO TABS: 5.0000 mg | ORAL_TABLET | Freq: Every day | ORAL | 2 refills | Status: DC

## 2020-06-17 ENCOUNTER — Encounter: Payer: Self-pay | Admitting: Orthopaedic Surgery

## 2020-06-17 ENCOUNTER — Other Ambulatory Visit: Payer: Self-pay

## 2020-06-17 ENCOUNTER — Ambulatory Visit (INDEPENDENT_AMBULATORY_CARE_PROVIDER_SITE_OTHER): Payer: Self-pay | Admitting: Orthopaedic Surgery

## 2020-06-17 ENCOUNTER — Ambulatory Visit: Payer: Self-pay | Attending: Family Medicine

## 2020-06-17 DIAGNOSIS — M4722 Other spondylosis with radiculopathy, cervical region: Secondary | ICD-10-CM

## 2020-06-17 DIAGNOSIS — E871 Hypo-osmolality and hyponatremia: Secondary | ICD-10-CM

## 2020-06-17 DIAGNOSIS — I10 Essential (primary) hypertension: Secondary | ICD-10-CM

## 2020-06-17 MED FILL — FLUTICASONE PROP 50 MCG SPR: 50 | 30 days supply | Qty: 16 | Fill #1

## 2020-06-17 NOTE — Progress Notes (Signed)
Office Visit Note   Patient: Isabel Evans           Date of Birth: 1970-06-30           MRN: [id number] Visit Date: 06/17/2020              Requested by: No referring provider defined for this encounter. PCP: Patient, No Pcp Per   Assessment & Plan: Visit Diagnoses:  1. Other spondylosis with radiculopathy, cervical region     Plan: Patient has progressive endplate degeneration comparison MRI 2019 at the C5-6 level basically single level disease.  She now has mild stenosis and moderate foraminal stenosis left worse than right with mild cord mass-effect.  Her symptoms are worse it bothers her daily at work.  She has been treated with anti-inflammatories prednisone Dosepak, therapy exercise program, muscle relaxants, topical medication, symptoms times greater than 3 years with progressive symptoms to the point where she is having great trouble working.  She states she needs to get something done otherwise she is not able to continue work and is concerned she might have to go on disability.  MRI scan was reviewed we discussed single level cervical fusion overnight stay 6 weeks in the collar and she should be able to resume normal work activity.  Risks of surgery discussed.  Dysphagia dysphonia, low risk of pseudoarthrosis for single level fusion discussed.  Questions were elicited and answered she understands request to proceed.  Follow-Up Instructions: No follow-ups on file.   Orders:  No orders of the defined types were placed in this encounter.  No orders of the defined types were placed in this encounter.     Procedures: No procedures performed   Clinical Data: No additional findings.   Subjective: Chief Complaint  Patient presents with  . 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  She has had Dupuytren's release in the past treated at [facility] with early recurrence within a few months.  She does have the Dupuytren's in the right hand but not as severe.  Patient's had persistent neck pain pain that radiates into her arm and down into the radial side of her left hand more than right hand.  MRI scan has been obtained and shows progression of stenosis and foraminal stenosis at C5-6.  Review of Systems   Dupuytren's right and left hand left hand recurrent Dupuytren's with scars..  Chronic neck pain disc fusion at C5-6 by cervical MRI scan.  Previous mild lumbar protrusions L2-3, L4-5 and L5-S1.  Patient reports history of fibromyalgia all other systems noncontributory to HPI   Objective: Vital Signs: BP 123/84   Pulse 80   Ht 5\' 11"  (1.803 m)   Wt 157 lb (71.2 kg)   LMP 07/04/2017   BMI 21.90 kg/m   Physical Exam Constitutional:      Appearance: She is well-developed.  HENT:     Head: Normocephalic.     Right Ear: External ear normal.     Left Ear: External ear normal.  Eyes:     Pupils: Pupils are equal, round, and reactive to light.  Neck:     Thyroid: No thyromegaly.     Trachea: No tracheal deviation.  Cardiovascular:  Rate and Rhythm: Normal rate.  Pulmonary:     Effort: Pulmonary effort is normal.  Abdominal:     Palpations: Abdomen is soft.  Skin:    General: Skin is warm and dry.  Neurological:     Mental Status: She is alert and oriented to person, place, and time.  Psychiatric:        Mood and Affect: Mood and affect normal.        Behavior: Behavior normal.     Ortho Exam bilateral Dupuytren's recurrent left hand with small finger PIP MCP contracture.  Good capillary refill the fingertips.  Patient has brachial plexus tenderness left greater than right.  Positive Spurling left greater than right.  Decreased sensation radial side of her hands. Specialty Comments:  No  specialty comments available.  Imaging:CLINICAL DATA:  51 year old female with cervical spondylosis, disc disease at C5-C6.  EXAM: MRI CERVICAL SPINE WITHOUT CONTRAST  TECHNIQUE: Multiplanar, multisequence MR imaging of the cervical spine was performed. No intravenous contrast was administered.  COMPARISON:  Cervical spine MRI 03/15/2018. Cervical spine radiographs 02/13/2018.  FINDINGS: Alignment: Straightening of cervical lordosis has not significantly changed since 2019. No significant spondylolisthesis.  Vertebrae: No marrow edema or evidence of acute osseous abnormality. Chronic degenerative endplate marrow signal changes at C5-C6. Normal background bone marrow signal.  Cord: Normal cervical and visible upper thoracic spinal cord signal and morphology except at C5-C6. No abnormal cord signal identified.  Posterior Fossa, vertebral arteries, paraspinal tissues: Cervicomedullary junction is within normal limits. Negative visible posterior fossa. Dominant left vertebral artery again noted. Stable major vascular flow voids in the neck. The right vertebral is diminutive. Negative visible neck soft tissues, right lung apex.  Disc levels:  C2-C3:  Negative.  C3-C4: Stable mild facet hypertrophy greater on the left. Borderline to mild C4 foraminal stenosis.  C4-C5:  Negative.  C5-C6: Chronic disc space loss. Circumferential disc bulge with endplate spurring. Broad-based posterior component of disc appears progressed since 2019 (series 6, image 27) along with mild spinal stenosis and mild cord mass effect. Mild to moderate left and mild right C6 foraminal stenosis also appear increased.  C6-C7:  Mild left facet hypertrophy. Otherwise negative.  C7-T1: Mild bilateral facet hypertrophy. Stable borderline to mild right C8 foraminal stenosis.  T1-T2: Mild facet hypertrophy.  IMPRESSION: 1. Chronic disc and endplate degeneration at C5-C6  appears progressed since a 2019 MRI along with mild spinal stenosis and mild to moderate left C6 neural foraminal stenosis. Mild cord mass effect, but no cord signal abnormality.  2. Mild cervical spine degeneration elsewhere not significantly changed.   Electronically Signed   By: Genevie Ann M.D.   On: 06/05/2020 10:19    PMFS History: Patient Active Problem List   Diagnosis Date Noted  . Other spondylosis with radiculopathy, cervical region 06/18/2020  . Major depressive disorder, recurrent episode, moderate ([place]) 05/26/2020  . Protrusion of cervical intervertebral disc 03/22/2018  . Anemia 11/24/2017  . Pre-diabetes 11/24/2017  . Blurry vision, bilateral 05/18/2017  . Callus of foot 05/18/2017  . Pain in joint of left hip 02/16/2017  . Bruises easily 11/10/2016  . Gastroesophageal reflux disease without esophagitis 09/22/2016  . Fatigue associated with anemia 09/22/2016  . Flexion contractures 09/22/2016  . Generalized anxiety disorder 01/01/2016  . Dental abscess 01/01/2016  . Healthcare maintenance 05/08/2015  . Adjustment disorder with mixed anxiety and depressed mood 05/08/2015  . Stress at home 10/24/2014  . Irritable bowel syndrome 10/24/2014  . Anxiety state 10/24/2014  .  Preop testing 09/19/2014  . Pap smear for cervical cancer screening 09/19/2014  . Midline low back pain without sciatica 08/12/2014  . [place] nodes 08/12/2014  . Contracture of joint, hand 08/12/2014  . Essential hypertension 02/11/2014  . Allergy 02/11/2014  . Screening for breast cancer 02/11/2014  . Dupuytren's contracture of both hands 11/15/2013  . Hallux valgus of right foot 11/15/2013  . Back pain 05/21/2013  . UTI (urinary tract infection) 05/21/2013  . Multiple skin nodules 11/27/2012  . Fibromyalgia 09/08/2012  . Osteoarthritis 09/08/2012  . Neck muscle spasm 07/14/2012  . Plantar wart 07/14/2012  . Insomnia 07/14/2012   Past Medical History:  Diagnosis Date  . Anemia  11/24/2017  . Anxiety   . Bone spur    cervical spine  . Chronic kidney disease 04/2017   kidney infections  . Depression   . Deviated nasal septum    states is unable to lie flat, because her nose will become congested and she can stop breathing  . Diabetes mellitus without complication ([place])   . Dysrhythmia    heart flutters occasionally  . Fibromyalgia   . Fibromyalgia   . GERD (gastroesophageal reflux disease)   . Hallux abductovalgus with bunions 09/2014   right   . Hammertoe 09/2014   right 4th, 5th  . History of stomach ulcers   . Hyperlipidemia   . Hypertension    states is under control with med., has been on med. x 8 mos.  . IBS (irritable bowel syndrome)    no current med.  . Osteoarthritis    hands, bilateral hips  . Peripheral vascular disease (HCC)    occasional swelling in both legs  . Sinus headache     Family History  Problem Relation Age of Onset  . Hypertension Mother   . Diabetes Mother   . Heart disease Mother   . Hypertension Father   . Diabetes Father   . Prostate cancer Father   . Heart disease Father   . Alcohol abuse Father   . Hypertension Sister   . Diabetes Sister   . Heart disease Maternal Grandmother        Great GM  . Breast cancer Maternal Grandmother   . Bone cancer Maternal Grandmother   . Breast cancer Maternal Aunt   . Ovarian cancer Maternal Aunt   . Rheum arthritis Sister   . Colon cancer Other        great Aunt  . Rectal cancer Neg Hx   . Stomach cancer Neg Hx     Past Surgical History:  Procedure Laterality Date  . ABDOMINAL HYSTERECTOMY    . BREAST BIOPSY Left 04/2018   benign  . BREAST BIOPSY Left 2016   benign  . BUNIONECTOMY Right 10/04/2014   Procedure:  Altamese [place], Emmaline Life;  Surgeon: Trula Slade, DPM;  Location: [facility];  Service: Podiatry;  Laterality: Right;  . COLONOSCOPY WITH PROPOFOL  02/13/2013  . CYSTOSCOPY N/A 07/07/2017   Procedure: CYSTOSCOPY;  Surgeon:  Aletha Halim, MD;  Location: [facility];  Service: Gynecology;  Laterality: N/A;  . DUPUYTREN / PALMAR FASCIOTOMY Right 07/18/2013   exc. of multiple nodules  . 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 HAMMER TOE SURGERY Right 10/04/2014   Procedure: HAMMER TOE REPAIR 4TH  AND 5TH  RIGHT FOOT  fourth toe fixation with k wire;  Surgeon: Trula Slade, DPM;  Location: [facility];  Service: Podiatry;  Laterality: Right;  . 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 TUBAL LIGATION    . VAGINAL HYSTERECTOMY N/A 07/07/2017   Procedure: HYSTERECTOMY VAGINAL;  Surgeon: Aletha Halim, MD;  Location: [facility];  Service: Gynecology;  Laterality: N/A;   Social History   Occupational History  . Occupation: cook  Tobacco Use  . Smoking status: Former Smoker    Packs/day: 1.00    Years: 20.00    Pack years: 20.00    Types: Cigarettes    Quit date: 08/24/2012    Years since quitting: 7.8  . Smokeless tobacco: Never Used  Vaping Use  . Vaping Use: Some days  Substance and Sexual Activity  . Alcohol use: Yes    Comment: occasionally  . Drug use: Yes    Types: Marijuana  . Sexual activity: Yes    Birth control/protection: Surgical

## 2020-06-18 ENCOUNTER — Telehealth: Payer: Self-pay | Admitting: Orthopaedic Surgery

## 2020-06-18 ENCOUNTER — Other Ambulatory Visit: Payer: Self-pay | Admitting: Orthopaedic Surgery

## 2020-06-18 DIAGNOSIS — M4722 Other spondylosis with radiculopathy, cervical region: Secondary | ICD-10-CM | POA: Insufficient documentation

## 2020-06-18 LAB — COMPREHENSIVE METABOLIC PANEL
ALT: 22 IU/L (ref 0–32)
AST: 17 IU/L (ref 0–40)
Albumin/Globulin Ratio: 2 (ref 1.2–2.2)
Albumin: 4.9 g/dL — ABNORMAL HIGH (ref 3.8–4.8)
Alkaline Phosphatase: 60 IU/L (ref 44–121)
BUN/Creatinine Ratio: 14 (ref 9–23)
BUN: 12 mg/dL (ref 6–24)
Bilirubin Total: 0.3 mg/dL (ref 0.0–1.2)
CO2: 24 mmol/L (ref 20–29)
Calcium: 9.6 mg/dL (ref 8.7–10.2)
Chloride: 99 mmol/L (ref 96–106)
Creatinine, Ser: 0.83 mg/dL (ref 0.57–1.00)
Globulin, Total: 2.5 g/dL (ref 1.5–4.5)
Glucose: 78 mg/dL (ref 65–99)
Potassium: 4.1 mmol/L (ref 3.5–5.2)
Sodium: 138 mmol/L (ref 134–144)
Total Protein: 7.4 g/dL (ref 6.0–8.5)
eGFR: 86 mL/min/{1.73_m2} (ref >59)

## 2020-06-18 NOTE — Telephone Encounter (Signed)
Patient called asked if she can get an injection in her neck prior to surgery. Patient said she is in a lot of pain. The number to contact patient is [phone]

## 2020-06-18 NOTE — Telephone Encounter (Signed)
Please advise 

## 2020-06-19 ENCOUNTER — Ambulatory Visit: Payer: Self-pay | Attending: Family Medicine | Admitting: Family Medicine

## 2020-06-19 ENCOUNTER — Other Ambulatory Visit: Payer: Self-pay

## 2020-06-19 ENCOUNTER — Encounter: Payer: Self-pay | Admitting: Family Medicine

## 2020-06-19 ENCOUNTER — Other Ambulatory Visit: Payer: Self-pay | Admitting: Family Medicine

## 2020-06-19 VITALS — BP 118/75 | HR 72 | Ht 71.0 in | Wt 157.6 lb

## 2020-06-19 DIAGNOSIS — M545 Low back pain, unspecified: Secondary | ICD-10-CM

## 2020-06-19 DIAGNOSIS — M4722 Other spondylosis with radiculopathy, cervical region: Secondary | ICD-10-CM

## 2020-06-19 DIAGNOSIS — G8929 Other chronic pain: Secondary | ICD-10-CM

## 2020-06-19 DIAGNOSIS — M720 Palmar fascial fibromatosis [Dupuytren]: Secondary | ICD-10-CM

## 2020-06-19 DIAGNOSIS — M25552 Pain in left hip: Secondary | ICD-10-CM

## 2020-06-19 MED ORDER — TRAMADOL HCL 50 MG PO TABS
50.0000 mg | ORAL_TABLET | Freq: Three times a day (TID) | ORAL | 1 refills | Status: DC | PRN
Start: 2020-06-19 — End: 2020-06-19

## 2020-06-19 NOTE — Progress Notes (Signed)
Needs refills on medication.

## 2020-06-19 NOTE — Progress Notes (Signed)
Subjective:  Patient ID: Isabel Evans, female    DOB: [birth year]  Age: 51 y.o. MRN: [id number]  CC: Establish Care   HPI Isabel Evans is a 50 year old female patient of Dr Chapman Fitch  with a history of Anxiety and Depression, spondylosis with cervical radiculopathy, Dupuytren's contracture, chronic low back pain here to establish care.  Seen by Dr. Lorin Mercy of orthopedics on 06/17/2020 and notes reviewed with plans for single level cervical fusion; date is yet to be determined. States she hurts all over - in her neeck, back of head, shoulders, hips.She has pins and needles sensations in her neck and upper back. She is R hand dominant. States she has Fibromyalgia. She has Duputyren's contracture in her hands and had surgery in the past but it reoccurred. These affect her job and she ia unable to lay her palm flat. States Tramadol does not work and she uses Voltaren gel in addition.  Sees Pscyh - Dr Reita Cliche who prescribes Lexparo and Trazodone Uses THC to help with pain and help her relax. Does not do cocaine or heroin.  Past Medical History:  Diagnosis Date  . Anemia 11/24/2017  . Anxiety   . Bone spur    cervical spine  . Chronic kidney disease 04/2017   kidney infections  . Depression   . Deviated nasal septum    states is unable to lie flat, because her nose will become congested and she can stop breathing  . Diabetes mellitus without complication ([place])   . Dysrhythmia    heart flutters occasionally  . Fibromyalgia   . Fibromyalgia   . GERD (gastroesophageal reflux disease)   . Hallux abductovalgus with bunions 09/2014   right   . Hammertoe 09/2014   right 4th, 5th  . History of stomach ulcers   . Hyperlipidemia   . Hypertension    states is under control with med., has been on med. x 8 mos.  . IBS (irritable bowel syndrome)    no current med.  . Osteoarthritis    hands, bilateral hips  . Peripheral vascular disease (HCC)    occasional swelling in both legs  . Sinus  headache     Past Surgical History:  Procedure Laterality Date  . ABDOMINAL HYSTERECTOMY    . BREAST BIOPSY Left 04/2018   benign  . BREAST BIOPSY Left 2016   benign  . BUNIONECTOMY Right 10/04/2014   Procedure:  Altamese [place], Emmaline Life;  Surgeon: Trula Slade, DPM;  Location: [facility];  Service: Podiatry;  Laterality: Right;  . COLONOSCOPY WITH PROPOFOL  02/13/2013  . CYSTOSCOPY N/A 07/07/2017   Procedure: CYSTOSCOPY;  Surgeon: Aletha Halim, MD;  Location: [facility];  Service: Gynecology;  Laterality: N/A;  . DUPUYTREN / PALMAR FASCIOTOMY Right 07/18/2013   exc. of multiple nodules  . 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 HAMMER TOE SURGERY Right 10/04/2014   Procedure: HAMMER TOE REPAIR 4TH  AND 5TH  RIGHT FOOT  fourth toe fixation with k wire;  Surgeon: Trula Slade, DPM;  Location: [facility];  Service: Podiatry;  Laterality: Right;  . 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 TUBAL LIGATION    . VAGINAL HYSTERECTOMY N/A 07/07/2017   Procedure: HYSTERECTOMY VAGINAL;  Surgeon: Aletha Halim, MD;  Location: [facility];  Service: Gynecology;  Laterality: N/A;    Family History  Problem Relation Age of Onset  .  Hypertension Mother   . Diabetes Mother   . Heart disease Mother   . Hypertension Father   . Diabetes Father   . Prostate cancer Father   . Heart disease Father   . Alcohol abuse Father   . Hypertension Sister   . Diabetes Sister   . Heart disease Maternal Grandmother        Great GM  . Breast cancer Maternal Grandmother   . Bone cancer Maternal Grandmother   . Breast cancer Maternal Aunt   . Ovarian cancer Maternal Aunt   . Rheum arthritis Sister   . Colon cancer Other        great Aunt  . Rectal cancer Neg Hx   . Stomach cancer Neg Hx     No Known Allergies  Outpatient Medications Prior to Visit  Medication Sig Dispense Refill  . amLODipine (NORVASC) 5 MG  tablet Take 1 tablet (5 mg total) by mouth daily. 30 tablet 2  . diclofenac (VOLTAREN) 50 MG EC tablet Take 1 tablet (50 mg total) by mouth 2 (two) times daily. After a meal as needed for pain 60 tablet 0  . dicyclomine (BENTYL) 10 MG capsule 1 bid prn 60 capsule 2  . diphenhydrAMINE (BENADRYL) 25 MG tablet Take 25 mg by mouth daily as needed for allergies.    Marland Kitchen escitalopram (LEXAPRO) 10 MG tablet Take 2 tablets (20 mg total) by mouth daily. 180 tablet 0  . fluticasone (FLONASE) 50 MCG/ACT nasal spray Place 2 sprays into both nostrils daily. 16 g 11  . gabapentin (NEURONTIN) 300 MG capsule TAKE 1 CAPSULE BY MOUTH 2 TIMES DAILY AND 2 CAPSULES AT BEDTIME. 120 capsule 2  . metFORMIN (GLUCOPHAGE) 500 MG tablet Take 1 tablet (500 mg total) by mouth daily with breakfast. 90 tablet 0  . omeprazole (PRILOSEC) 20 MG capsule TAKE 2 CAPSULES (40 MG TOTAL) BY MOUTH DAILY. 60 capsule 2  . ondansetron (ZOFRAN-ODT) 8 MG disintegrating tablet TAKE 1 TABLET (8 MG TOTAL) BY MOUTH DAILY AS NEEDED FOR NAUSEA OR VOMITING. 20 tablet 2  . rosuvastatin (CRESTOR) 20 MG tablet Take 1 tablet (20 mg total) by mouth daily. To lower cholesterol 30 tablet 2  . tiZANidine (ZANAFLEX) 4 MG tablet Take 1 tablet (4 mg total) by mouth every 8 (eight) hours as needed for muscle spasms. Start with 1/2 pill (2 mg) as patient can cause drowsiness 90 tablet 0  . traMADol (ULTRAM) 50 MG tablet TAKE 1 TABLET (50 MG TOTAL) BY MOUTH 3 (THREE) TIMES DAILY. AS NEEDED FOR PAIN. (30 DAY SUPPLY) 90 tablet 5  . traZODone (DESYREL) 50 MG tablet Take 1 tablet (50 mg total) by mouth at bedtime as needed for sleep (make repeat once in an hour if not asleep). 60 tablet 2  . clonazePAM (KLONOPIN) 0.5 MG tablet Take 1 bid prn anxiety.  Use sparingly (Patient not taking: Reported on 06/19/2020) 30 tablet 1   No facility-administered medications prior to visit.     ROS Review of Systems  Constitutional: Negative for activity change, appetite change and  fatigue.  HENT: Negative for congestion, sinus pressure and sore throat.   Eyes: Negative for visual disturbance.  Respiratory: Negative for cough, chest tightness, shortness of breath and wheezing.   Cardiovascular: Negative for chest pain and palpitations.  Gastrointestinal: Negative for abdominal distention, abdominal pain and constipation.  Endocrine: Negative for polydipsia.  Genitourinary: Negative for dysuria and frequency.  Musculoskeletal:       See HPI  Skin: Negative  for rash.  Neurological: Negative for tremors, light-headedness and numbness.  Hematological: Does not bruise/bleed easily.  Psychiatric/Behavioral: Negative for agitation and behavioral problems.    Objective:  BP 118/75   Pulse 72   Ht 5\' 11"  (1.803 m)   Wt 157 lb 9.6 oz (71.5 kg)   LMP 07/04/2017   SpO2 98%   BMI 21.98 kg/m   BP/Weight 06/19/2020 06/25/1827 12/21/7167  Systolic BP 678 938 101  Diastolic BP 75 84 72  Wt. (Lbs) 157.6 157 157  BMI 21.98 21.9 21.9      Physical Exam Constitutional:      Appearance: She is well-developed.  Neck:     Vascular: No JVD.  Cardiovascular:     Rate and Rhythm: Normal rate.     Heart sounds: Normal heart sounds. No murmur heard.   Pulmonary:     Effort: Pulmonary effort is normal.     Breath sounds: Normal breath sounds. No wheezing or rales.  Chest:     Chest wall: No tenderness.  Abdominal:     General: Bowel sounds are normal. There is no distension.     Palpations: Abdomen is soft. There is no mass.     Tenderness: There is no abdominal tenderness.  Musculoskeletal:     Cervical back: Rigidity and tenderness present.     Right lower leg: No edema.     Left lower leg: No edema.     Comments: Positive straight leg raise on the right Dupuytren's contractures in both hands  Neurological:     Mental Status: She is alert and oriented to person, place, and time.  Psychiatric:        Mood and Affect: Mood normal.     CMP Latest Ref Rng & Units  06/17/2020 05/28/2020 03/24/2020  Glucose 65 - 99 mg/dL 78 101(H) 78  BUN 6 - 24 mg/dL 12 17 7   Creatinine 0.57 - 1.00 mg/dL 0.83 1.10(H) 0.78  Sodium 134 - 144 mmol/L 138 132(L) 139  Potassium 3.5 - 5.2 mmol/L 4.1 4.8 3.9  Chloride 96 - 106 mmol/L 99 93(L) 100  CO2 20 - 29 mmol/L 24 24 31   Calcium 8.7 - 10.2 mg/dL 9.6 9.1 9.6  Total Protein 6.0 - 8.5 g/dL 7.4 6.6 7.3  Total Bilirubin 0.0 - 1.2 mg/dL 0.3 0.2 0.3  Alkaline Phos 44 - 121 IU/L 60 59 54  AST 0 - 40 IU/L 17 17 12   ALT 0 - 32 IU/L 22 16 12     Lipid Panel     Component Value Date/Time   CHOL 147 08/24/2019 0847   TRIG 87 08/24/2019 0847   HDL 69 08/24/2019 0847   CHOLHDL 2.1 08/24/2019 0847   LDLCALC 62 08/24/2019 0847    CBC    Component Value Date/Time   WBC 7.5 05/28/2020 1626   WBC 6.7 03/24/2020 1228   RBC 3.96 05/28/2020 1626   RBC 4.32 03/24/2020 1228   HGB 11.8 05/28/2020 1626   HCT 35.3 05/28/2020 1626   PLT 257 05/28/2020 1626   MCV 89 05/28/2020 1626   MCH 29.8 05/28/2020 1626   MCH 29.5 03/02/2018 1630   MCHC 33.4 05/28/2020 1626   MCHC 33.7 03/24/2020 1228   RDW 12.7 05/28/2020 1626   LYMPHSABS 3.1 05/28/2020 1626   MONOABS 0.5 03/24/2020 1228   EOSABS 0.1 05/28/2020 1626   BASOSABS 0.1 05/28/2020 1626    Lab Results  Component Value Date   HGBA1C 5.9 (H) 05/28/2020    Assessment &  Plan:  1. Other spondylosis with radiculopathy, cervical region Uncontrolled Anticipated surgery Reviewed PDMP with no suspicious activity noted Lack of medical coverage precludes referral to pain managment - Drug Screen 12+Alcohol+CRT, Ur  2. Chronic left hip pain See #1 above - traMADol (ULTRAM) 50 MG tablet; Take 1 tablet (50 mg total) by mouth 3 (three) times daily as needed. TAKE 1 TABLET (50 MG TOTAL) BY MOUTH 3 (THREE) TIMES DAILY. AS NEEDED FOR PAIN. (30 DAY SUPPLY)  Dispense: 90 tablet; Refill: 1  3. Dupuytren's contracture of both hands Has had previous surgery with recurrence - traMADol  (ULTRAM) 50 MG tablet; Take 1 tablet (50 mg total) by mouth 3 (three) times daily as needed. TAKE 1 TABLET (50 MG TOTAL) BY MOUTH 3 (THREE) TIMES DAILY. AS NEEDED FOR PAIN. (30 DAY SUPPLY)  Dispense: 90 tablet; Refill: 1  4. Chronic midline low back pain without sciatica Uncontrolled Apply heat - traMADol (ULTRAM) 50 MG tablet; Take 1 tablet (50 mg total) by mouth 3 (three) times daily as needed. TAKE 1 TABLET (50 MG TOTAL) BY MOUTH 3 (THREE) TIMES DAILY. AS NEEDED FOR PAIN. (30 DAY SUPPLY)  Dispense: 90 tablet; Refill: 1 .   No orders of the defined types were placed in this encounter.   Return in about 3 months (around 09/19/2020) for Chronic disease management.       Charlott Rakes, MD, FAAFP. [facility] and [place], [place]   06/19/2020, 11:21 AM

## 2020-06-19 NOTE — Patient Instructions (Signed)

## 2020-06-24 ENCOUNTER — Encounter: Payer: Self-pay | Admitting: *Deleted

## 2020-06-24 ENCOUNTER — Telehealth: Payer: Self-pay

## 2020-06-24 NOTE — Telephone Encounter (Signed)
   Primary Cardiologist: Evalina Field, MD  Chart reviewed as part of pre-operative protocol coverage. Patient was contacted 06/24/2020 in reference to pre-operative risk assessment for pending surgery as outlined below.  Makayleigh Poliquin was last seen on 02/07/20 by Dr. Audie Box.  Since that day, Louella Medaglia has done well. She can complete more than 4.0 METS without angina. She does not have a history of CAD or MI.   Therefore, based on ACC/AHA guidelines, the patient would be at acceptable risk for the planned procedure without further cardiovascular testing.   The patient was advised that if she develops new symptoms prior to surgery to contact our office to arrange for a follow-up visit, and she verbalized understanding.  I will route this recommendation to the requesting party via Epic fax function and remove from pre-op pool. Please call with questions.  [place], PA 06/24/2020, 11:21 AM

## 2020-06-24 NOTE — Telephone Encounter (Signed)
   [facility] Pre-operative Risk Assessment    Request for surgical clearance:  1. What type of surgery is being performed? CERVICAL FUSION   2. When is this surgery scheduled? TBD   3. What type of clearance is required (medical clearance vs. Pharmacy clearance to hold med vs. Both)? MEDICAL  4. Are there any medications that need to be held prior to surgery and how long? NO   5. Practice name and name of physician performing surgery? [facility] ATTN:[email or web address]  6. What is the office phone number? [phone]   7.   What is the office fax number? [phone]  8.   Anesthesia type (None, local, MAC, general) ? GENERAL

## 2020-06-25 ENCOUNTER — Other Ambulatory Visit: Payer: Self-pay | Admitting: Physician Assistant

## 2020-06-25 ENCOUNTER — Other Ambulatory Visit: Payer: Self-pay | Admitting: Family Medicine

## 2020-06-25 DIAGNOSIS — R7303 Prediabetes: Secondary | ICD-10-CM

## 2020-06-25 MED ORDER — METFORMIN HCL 500 MG PO TABS: 500.0000 mg | ORAL_TABLET | Freq: Every day | ORAL | 0 refills | Status: DC

## 2020-06-26 MED FILL — METFORMIN HCL 500 MG TABS: 500 | 30 days supply | Qty: 30 | Fill #0

## 2020-06-27 ENCOUNTER — Encounter: Payer: Self-pay | Admitting: Family Medicine

## 2020-06-27 ENCOUNTER — Ambulatory Visit: Payer: No Typology Code available for payment source | Admitting: Family Medicine

## 2020-06-27 NOTE — Progress Notes (Signed)
clearance

## 2020-07-01 ENCOUNTER — Other Ambulatory Visit: Payer: Self-pay | Admitting: Family Medicine

## 2020-07-01 ENCOUNTER — Other Ambulatory Visit: Payer: Self-pay | Admitting: Physician Assistant

## 2020-07-01 DIAGNOSIS — R110 Nausea: Secondary | ICD-10-CM

## 2020-07-01 NOTE — Telephone Encounter (Signed)
Requested medication (s) are due for refill today: no  Requested medication (s) are on the active medication list: yes  Last refill: 04/16/2020  Future visit scheduled: no  Notes to clinic:  this refill cannot be delegated    Requested Prescriptions  Pending Prescriptions Disp Refills   ondansetron (ZOFRAN-ODT) 8 MG disintegrating tablet [Pharmacy Med Name: ONDANSETRON ODT 8 MG TABLET 8 Tablet] 20 tablet 2    Sig: TAKE 1 TABLET (8 MG TOTAL) BY MOUTH DAILY AS NEEDED FOR NAUSEA OR VOMITING.      Not Delegated - Gastroenterology: Antiemetics Failed - 07/01/2020 11:43 AM      Failed - This refill cannot be delegated      Passed - Valid encounter within last 6 months    Recent Outpatient Visits           1 week ago Other spondylosis with radiculopathy, cervical region   Barnum, Enobong, MD   1 month ago [place], [place], [place]   3 months ago [place], [place]   4 months ago Neck pain   Mauldin, MD   5 months ago Abnormal weight loss   [place] Antony Blackbird, MD       Future Appointments             In 2 weeks Lanae Crumbly, PA-C [place]   In 1 month Marybelle Killings, MD [facility]

## 2020-07-02 LAB — DRUG SCREEN 12+ALCOHOL+CRT, UR
Amphetamines, Urine: NEGATIVE ng/mL
BENZODIAZ UR QL: NEGATIVE ng/mL
Barbiturate: NEGATIVE ng/mL
Cannabinoids: POSITIVE — AB
Cocaine (Metabolite): NEGATIVE ng/mL
Creatinine, Urine: 65.8 mg/dL (ref 20.0–300.0)
Ethanol, Urine: NEGATIVE %
Meperidine: NEGATIVE ng/mL
Methadone: NEGATIVE ng/mL
OPIATE SCREEN URINE: NEGATIVE ng/mL
Oxycodone/Oxymorphone, Urine: NEGATIVE ng/mL
Phencyclidine: NEGATIVE ng/mL
Propoxyphene: NEGATIVE ng/mL
Tramadol: POSITIVE — AB

## 2020-07-07 ENCOUNTER — Encounter (HOSPITAL_COMMUNITY): Payer: Self-pay

## 2020-07-07 ENCOUNTER — Telehealth (INDEPENDENT_AMBULATORY_CARE_PROVIDER_SITE_OTHER): Payer: No Payment, Other | Admitting: Psychiatry

## 2020-07-07 ENCOUNTER — Other Ambulatory Visit: Payer: Self-pay

## 2020-07-07 ENCOUNTER — Other Ambulatory Visit (HOSPITAL_COMMUNITY): Payer: Self-pay | Admitting: Psychiatry

## 2020-07-07 VITALS — BP 110/70

## 2020-07-07 DIAGNOSIS — M545 Low back pain, unspecified: Secondary | ICD-10-CM

## 2020-07-07 DIAGNOSIS — M797 Fibromyalgia: Secondary | ICD-10-CM

## 2020-07-07 DIAGNOSIS — F331 Major depressive disorder, recurrent, moderate: Secondary | ICD-10-CM | POA: Diagnosis not present

## 2020-07-07 MED ORDER — GABAPENTIN 300 MG PO CAPS: ORAL_CAPSULE | ORAL | 2 refills | Status: DC

## 2020-07-07 MED ORDER — QUETIAPINE FUMARATE 25 MG PO TABS: 25.0000 mg | ORAL_TABLET | Freq: Every day | ORAL | 2 refills | Status: DC

## 2020-07-07 MED ORDER — ESCITALOPRAM OXALATE 10 MG PO TABS: 20.0000 mg | ORAL_TABLET | Freq: Every day | ORAL | 0 refills | Status: DC

## 2020-07-07 MED FILL — ESCITALOPRAM 10 MG TABLET: 10 | 30 days supply | Qty: 60 | Fill #0

## 2020-07-07 MED FILL — QUETIAPINE FUMARATE 25 MG T: 25 | 30 days supply | Qty: 30 | Fill #0

## 2020-07-07 NOTE — Progress Notes (Signed)
Psychiatric Initial Adult Assessment   Patient Identification: Isabel Evans MRN:  [id number] Date of Evaluation:  07/07/2020 Referral Source: PCP Chief Complaint:  Depression and anxiety Chief Complaint    Anxiety; Follow-up; Depression     Visit Diagnosis:  Major depressive disorder, General anxiety disorder  History of Present Illness:   50 yo female with multiple, chronic medical issues and many stresses in a short period of time increasing her depression and anxiety.  Reports feeling "little better", moderate levels of depression and anxiety.  No suicidal/homicidal ideations, hallucinations, or mania symptoms.  She is working and planning on cervical surgery in April, anxious about this and managing the care of her mother.  Appetite and weight are steady.  Stopped taking her Trazodone and noticed her blood pressure was not decreasing as much, already low, worried about restarting the Trazodone.  Night sweats continue to be an issue and her Harle Battiest will manage this with hormones.  Managing her stress better than her previous visits.  Discussed medications and decided to discontinue the Trazodone and start a low dose of Seroquel, follow-up in 1 month.  Associated Signs/Symptoms: Depression Symptoms:  depressed mood, anxiety, panic attacks, (Hypo) Manic Symptoms:  none Anxiety Symptoms:  Excessive Worry, Panic Symptoms, Psychotic Symptoms:  none PTSD Symptoms: NA  Past Psychiatric History: depression and anxiety  Previous Psychotropic Medications: Yes   Substance Abuse History in the last 12 months:  Yes.    Consequences of Substance Abuse: NA  Past Medical History:  Past Medical History:  Diagnosis Date  . Anemia 11/24/2017  . Anxiety   . Bone spur    cervical spine  . Chronic kidney disease 04/2017   kidney infections  . Depression   . Deviated nasal septum    states is unable to lie flat, because her nose will become congested and she can stop breathing  .  Diabetes mellitus without complication ([place])   . Dysrhythmia    heart flutters occasionally  . Fibromyalgia   . Fibromyalgia   . GERD (gastroesophageal reflux disease)   . Hallux abductovalgus with bunions 09/2014   right   . Hammertoe 09/2014   right 4th, 5th  . History of stomach ulcers   . Hyperlipidemia   . Hypertension    states is under control with med., has been on med. x 8 mos.  . IBS (irritable bowel syndrome)    no current med.  . Osteoarthritis    hands, bilateral hips  . Peripheral vascular disease (HCC)    occasional swelling in both legs  . Sinus headache     Past Surgical History:  Procedure Laterality Date  . ABDOMINAL HYSTERECTOMY    . BREAST BIOPSY Left 04/2018   benign  . BREAST BIOPSY Left 2016   benign  . BUNIONECTOMY Right 10/04/2014   Procedure:  Altamese [place], Emmaline Life;  Surgeon: Trula Slade, DPM;  Location: [facility];  Service: Podiatry;  Laterality: Right;  . COLONOSCOPY WITH PROPOFOL  02/13/2013  . CYSTOSCOPY N/A 07/07/2017   Procedure: CYSTOSCOPY;  Surgeon: Aletha Halim, MD;  Location: [facility];  Service: Gynecology;  Laterality: N/A;  . DUPUYTREN / PALMAR FASCIOTOMY Right 07/18/2013   exc. of multiple nodules  . 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 HAMMER TOE SURGERY Right 10/04/2014   Procedure: HAMMER TOE REPAIR 4TH  AND 5TH  RIGHT FOOT  fourth toe fixation with k wire;  Surgeon: Trula Slade, DPM;  Location: [facility];  Service: Podiatry;  Laterality: Right;  . 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 TUBAL LIGATION    . VAGINAL HYSTERECTOMY N/A 07/07/2017   Procedure: HYSTERECTOMY VAGINAL;  Surgeon: Aletha Halim, MD;  Location: [facility];  Service: Gynecology;  Laterality: N/A;    Family Psychiatric History: see below, mother and sister with depression and anxiety  Family History:  Family History  Problem Relation Age of Onset  .  Hypertension Mother   . Diabetes Mother   . Heart disease Mother   . Hypertension Father   . Diabetes Father   . Prostate cancer Father   . Heart disease Father   . Alcohol abuse Father   . Hypertension Sister   . Diabetes Sister   . Heart disease Maternal Grandmother        Great GM  . Breast cancer Maternal Grandmother   . Bone cancer Maternal Grandmother   . Breast cancer Maternal Aunt   . Ovarian cancer Maternal Aunt   . Rheum arthritis Sister   . Colon cancer Other        great Aunt  . Rectal cancer Neg Hx   . Stomach cancer Neg Hx     Social History:   Social History   Socioeconomic History  . Marital status: Single    Spouse name: Not on file  . Number of children: 3  . Years of education: Not on file  . Highest education level: 7th grade  Occupational History  . Occupation: cook  Tobacco Use  . Smoking status: Former Smoker    Packs/day: 1.00    Years: 20.00    Pack years: 20.00    Types: Cigarettes    Quit date: 08/24/2012    Years since quitting: 7.8  . Smokeless tobacco: Never Used  Vaping Use  . Vaping Use: Some days  Substance and Sexual Activity  . Alcohol use: Yes    Comment: occasionally  . Drug use: Yes    Types: Marijuana  . Sexual activity: Yes    Birth control/protection: Surgical  Other Topics Concern  . Not on file  Social History Narrative  . Not on file   Social Determinants of Health   Financial Resource Strain: Not on file  Food Insecurity: Not on file  Transportation Needs: No Transportation Needs  . Lack of Transportation (Medical): No  . Lack of Transportation (Non-Medical): No  Physical Activity: Not on file  Stress: Not on file  Social Connections: Not on file    Additional Social History: lives with supportive partner, works part-time  Allergies:  No Known Allergies  Metabolic Disorder Labs: Lab Results  Component Value Date   HGBA1C 5.9 (H) 05/28/2020   No results found for: PROLACTIN Lab Results  Component  Value Date   CHOL 147 08/24/2019   TRIG 87 08/24/2019   HDL 69 08/24/2019   CHOLHDL 2.1 08/24/2019   [place] 62 08/24/2019   [place] 60 07/06/2019   Lab Results  Component Value Date   TSH 0.606 01/04/2020    Therapeutic Level Labs: No results found for: LITHIUM No results found for: CBMZ No results found for: VALPROATE  Current Medications: Current Outpatient Medications  Medication Sig Dispense Refill  . amLODipine (NORVASC) 5 MG tablet Take 1 tablet (5 mg total) by mouth daily. 30 tablet 2  . diclofenac (VOLTAREN) 50 MG EC tablet Take 1 tablet (50 mg total) by mouth 2 (two) times daily.  After a meal as needed for pain 60 tablet 0  . dicyclomine (BENTYL) 10 MG capsule 1 bid prn 60 capsule 2  . diphenhydrAMINE (BENADRYL) 25 MG tablet Take 25 mg by mouth daily as needed for allergies.    Marland Kitchen escitalopram (LEXAPRO) 10 MG tablet Take 2 tablets (20 mg total) by mouth daily. 180 tablet 0  . fluticasone (FLONASE) 50 MCG/ACT nasal spray Place 2 sprays into both nostrils daily. 16 g 11  . gabapentin (NEURONTIN) 300 MG capsule TAKE 1 CAPSULE BY MOUTH 2 TIMES DAILY AND 2 CAPSULES AT BEDTIME. 120 capsule 2  . metFORMIN (GLUCOPHAGE) 500 MG tablet Take 1 tablet (500 mg total) by mouth daily with breakfast. 90 tablet 0  . omeprazole (PRILOSEC) 20 MG capsule TAKE 2 CAPSULES (40 MG TOTAL) BY MOUTH DAILY. 60 capsule 2  . ondansetron (ZOFRAN-ODT) 8 MG disintegrating tablet TAKE 1 TABLET (8 MG TOTAL) BY MOUTH DAILY AS NEEDED FOR NAUSEA OR VOMITING. 20 tablet 2  . rosuvastatin (CRESTOR) 20 MG tablet Take 1 tablet (20 mg total) by mouth daily. To lower cholesterol 30 tablet 2  . tiZANidine (ZANAFLEX) 4 MG tablet Take 1 tablet (4 mg total) by mouth every 8 (eight) hours as needed for muscle spasms. Start with 1/2 pill (2 mg) as patient can cause drowsiness 90 tablet 0  . traMADol (ULTRAM) 50 MG tablet Take 1 tablet (50 mg total) by mouth 3 (three) times daily as needed. TAKE 1 TABLET (50 MG TOTAL) BY MOUTH  3 (THREE) TIMES DAILY. AS NEEDED FOR PAIN. (30 DAY SUPPLY) 90 tablet 1  . traZODone (DESYREL) 50 MG tablet Take 1 tablet (50 mg total) by mouth at bedtime as needed for sleep (make repeat once in an hour if not asleep). 60 tablet 2   No current facility-administered medications for this visit.    Musculoskeletal: Strength & Muscle Tone: within normal limits Gait & Station: normal Patient leans: N/A  Psychiatric Specialty Exam: Review of Systems  Psychiatric/Behavioral: Positive for dysphoric mood. The patient is nervous/anxious.   All other systems reviewed and are negative.   Last menstrual period 07/04/2017.There is no height or weight on file to calculate BMI.  General Appearance: Casual  Eye Contact:  Good  Speech:  Normal Rate  Volume:  Normal  Mood:  Anxious and Depressed  Affect:  Congruent  Thought Process:  Coherent and Descriptions of Associations: Intact  Orientation:  Full (Time, Place, and Person)  Thought Content:  WDL and Logical  Suicidal Thoughts:  No  Homicidal Thoughts:  No  Memory:  Immediate;   Good Recent;   Good Remote;   Good  Judgement:  Good  Insight:  Good  Psychomotor Activity:  Normal  Concentration:  Concentration: Good and Attention Span: Good  Recall:  Good  Fund of Knowledge:Good  Language: Good  Akathisia:  No  Handed:  Right  AIMS (if indicated):  not needed  Assets:  Housing Intimacy Leisure Time Resilience Social Support  ADL's:  Intact  Cognition: WNL  Sleep:  Fair   Screenings: GAD-7   Personnel officer Visit from 06/19/2020 in [facility] Video Visit from 05/05/2020 in [facility] Office Visit from 01/04/2020 in [place] Office Visit from 07/06/2019 in [place] Office Visit from 09/06/2018 in [place]  Total GAD-7 Score 14 20 16 17 20     [place]   Flowsheet Row  Office  Visit from 06/19/2020 in [place] Video Visit from 06/09/2020 in [place] from 03/24/2020 in [place] Office Visit from 02/28/2020 in [place] Office Visit from 01/04/2020 in [place]  PHQ-2 Total Score 4 0 1 5 4   PHQ-9 Total Score 15 -- 4 16 15     Flowsheet Row Video Visit from 06/09/2020 in [place] from 12/26/2017 in [place] Office Visit from 07/21/2017 in [place] No Risk Low Risk No Risk      Assessment and Plan:  General anxiety disorder and panic disorder: -Continue Klonopin 0.5 mg daily PRN per PCP if warranted for panic attacks -Continue gabapentin 300 mg TID for anxiety and neuropathic pain  Major Depressive Disorder, recurrent, moderate -Continue Lexapro 20 mg daily -Establish and start therapy at [facility] or another facility -Reevaluate in 1 month  Chronic back Pain: -Continue care with MD -Encouraged yoga and acupuncture recommended by her MD  Insomnia: -Discontinue Trazodone 50 mg at bedtime PRN, repeat once in an hour if not effective -Start Seroquel 25 mg at bedtime PRN sleep  Virtual Visit via Video Note  I connected with Isabel Evans on 07/07/20 at 10:30 AM EDT by a video enabled telemedicine application and verified that I am speaking with the correct person using two identifiers.  Location: Patient: home Provider: home   I discussed the limitations of evaluation and management by telemedicine and the availability of in person appointments. The patient expressed understanding and agreed to proceed.  Follow Up Instructions: Follow up in 1 month   I discussed the assessment and treatment plan with the patient. The patient was provided an  opportunity to ask questions and all were answered. The patient agreed with the plan and demonstrated an understanding of the instructions.   The patient was advised to call back or seek an in-person evaluation if the symptoms worsen or if the condition fails to improve as anticipated.  I provided 20 minutes of non-face-to-face time during this encounter.   Waylan Boga, NP    Waylan Boga, NP 3/21/202210:43 AM

## 2020-07-09 ENCOUNTER — Other Ambulatory Visit: Payer: Self-pay

## 2020-07-09 ENCOUNTER — Ambulatory Visit: Payer: Self-pay | Attending: Family Medicine

## 2020-07-11 ENCOUNTER — Other Ambulatory Visit: Payer: Self-pay | Admitting: Physician Assistant

## 2020-07-11 DIAGNOSIS — M2550 Pain in unspecified joint: Secondary | ICD-10-CM

## 2020-07-11 DIAGNOSIS — M542 Cervicalgia: Secondary | ICD-10-CM

## 2020-07-11 MED FILL — traMADol HCL 50 MG TABS: 50 | 30 days supply | Qty: 90 | Fill #0

## 2020-07-11 MED FILL — GABAPENTIN 300 MG CAPSULE: 300 | 30 days supply | Qty: 120 | Fill #1

## 2020-07-11 MED FILL — ROSUVASTATIN CALCIUM 20 MG: 20 | 30 days supply | Qty: 30 | Fill #1

## 2020-07-11 MED FILL — tiZANidine HCL 4 MG TABS: 4 | 30 days supply | Qty: 90 | Fill #5

## 2020-07-11 MED FILL — DICLOFENAC SOD EC 50 MG TAB: 50 | 30 days supply | Qty: 60 | Fill #0

## 2020-07-11 MED FILL — FLUTICASONE PROP 50 MCG SPR: 50 | 30 days supply | Qty: 16 | Fill #1

## 2020-07-11 MED FILL — ?OMEPRAZOLE 20 MG CPDR: 20 | 30 days supply | Qty: 60 | Fill #1

## 2020-07-11 MED FILL — DICYCLOMINE 10 MG CAPSULE: 10 | 30 days supply | Qty: 60 | Fill #1

## 2020-07-16 ENCOUNTER — Encounter: Payer: Self-pay | Admitting: Surgery

## 2020-07-16 ENCOUNTER — Ambulatory Visit (INDEPENDENT_AMBULATORY_CARE_PROVIDER_SITE_OTHER): Payer: Self-pay | Admitting: Surgery

## 2020-07-16 VITALS — BP 132/89 | HR 63 | Ht 71.0 in | Wt 157.6 lb

## 2020-07-16 DIAGNOSIS — M5020 Other cervical disc displacement, unspecified cervical region: Secondary | ICD-10-CM

## 2020-07-16 NOTE — Progress Notes (Signed)
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  States that symptoms unchanged from previous visit.  She is want to proceed with C5-6 ACDF is scheduled.  Today history and physical performed.  Review of systems negative.  We have received preop medical and cardiac clearances.  Surgeon procedure discussed along with potential hospital stay.  We discussed smoking cessation and the importance of being compliant with wearing soft cervical collar postop.  She voices understanding.  All questions answered.

## 2020-07-19 ENCOUNTER — Other Ambulatory Visit: Payer: Self-pay

## 2020-07-25 NOTE — H&P (Signed)
Isabel Evans is an 50 y.o. female.    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  States that symptoms unchanged from previous visit.  She is want to proceed with C5-6 ACDF is scheduled.  Today history and physical performed.  Review of systems negative.  We have received preop medical and cardiac clearances.   Past Medical History:  Diagnosis Date  . Anemia 11/24/2017  . Anxiety   . Bone spur    cervical spine  . Chronic kidney disease 04/2017   kidney infections  . Depression   . Deviated nasal septum    states is unable to lie flat, because her nose will become congested and she can stop breathing  . Diabetes mellitus without complication ([place])   . Dysrhythmia    heart flutters occasionally  . Fibromyalgia   . Fibromyalgia   . GERD (gastroesophageal reflux disease)   . Hallux abductovalgus with bunions 09/2014   right   . Hammertoe 09/2014   right 4th, 5th  . History of stomach ulcers   . Hyperlipidemia   . Hypertension    states is under control with med., has been on med. x 8 mos.  . IBS (irritable bowel syndrome)    no current med.  . Osteoarthritis    hands, bilateral hips  . Peripheral vascular disease (HCC)    occasional swelling in both legs  . Sinus headache     Past Surgical History:  Procedure Laterality Date  . ABDOMINAL HYSTERECTOMY    . BREAST BIOPSY Left 04/2018   benign  . BREAST BIOPSY Left 2016   benign  . BUNIONECTOMY Right 10/04/2014   Procedure:  Altamese [place], Emmaline Life;  Surgeon: Trula Slade, DPM;  Location: [facility];  Service: Podiatry;  Laterality: Right;  . COLONOSCOPY WITH PROPOFOL  02/13/2013  . CYSTOSCOPY N/A 07/07/2017   Procedure: CYSTOSCOPY;  Surgeon: Aletha Halim, MD;  Location: [facility];  Service: Gynecology;   Laterality: N/A;  . DUPUYTREN / PALMAR FASCIOTOMY Right 07/18/2013   exc. of multiple nodules  . 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 HAMMER TOE SURGERY Right 10/04/2014   Procedure: HAMMER TOE REPAIR 4TH  AND 5TH  RIGHT FOOT  fourth toe fixation with k wire;  Surgeon: Trula Slade, DPM;  Location: [facility];  Service: Podiatry;  Laterality: Right;  . 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 TUBAL LIGATION    . VAGINAL HYSTERECTOMY N/A 07/07/2017   Procedure: HYSTERECTOMY VAGINAL;  Surgeon: Aletha Halim, MD;  Location: [facility];  Service: Gynecology;  Laterality: N/A;    Family History  Problem Relation Age of Onset  . Hypertension Mother   . Diabetes Mother   . Heart disease Mother   . Hypertension Father   . Diabetes Father   . Prostate cancer Father   . Heart disease Father   . Alcohol abuse Father   . Hypertension Sister   . Diabetes Sister   . Heart disease Maternal Grandmother        Great GM  . Breast cancer Maternal Grandmother   . Bone cancer Maternal Grandmother   . Breast cancer Maternal Aunt   . Ovarian cancer Maternal Aunt   . Rheum arthritis Sister   . Colon cancer Other  great Aunt  . Rectal cancer Neg Hx   . Stomach cancer Neg Hx    Social History:  reports that she quit smoking about 7 years ago. Her smoking use included cigarettes. She has a 20.00 pack-year smoking history. She has never used smokeless tobacco. She reports current alcohol use. She reports current drug use. Drug: Marijuana.  Allergies: No Known Allergies  No medications prior to admission.    No results found for this or any previous visit (from the past 48 hour(s)). No results found.  Review of Systems  Constitutional: Positive for activity change.  HENT: Negative.   Gastrointestinal: Negative.   Neurological: Positive for numbness.  Psychiatric/Behavioral: Negative.     Last menstrual period  07/04/2017. Physical Exam HENT:     Head: Normocephalic.  Eyes:     Extraocular Movements: Extraocular movements intact.  Cardiovascular:     Heart sounds: Normal heart sounds.  Pulmonary:     Effort: Pulmonary effort is normal. No respiratory distress.  Abdominal:     General: There is no distension.  Musculoskeletal:        General: Tenderness present.  Neurological:     Mental Status: She is alert and oriented to person, place, and time.      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  Will proceed with surgery as scheduled.  Surgical procedure discussed.  All questions answered.  Benjiman Core, PA-C 07/25/2020, 2:13 PM

## 2020-07-28 ENCOUNTER — Other Ambulatory Visit (HOSPITAL_COMMUNITY)
Admission: RE | Admit: 2020-07-28 | Discharge: 2020-07-28 | Disposition: A | Discharge status: Home / Self Care | Payer: Self-pay | Source: Ambulatory Visit | Attending: Orthopaedic Surgery | Admitting: Orthopaedic Surgery

## 2020-07-28 DIAGNOSIS — Z20822 Contact with and (suspected) exposure to covid-19: Secondary | ICD-10-CM | POA: Insufficient documentation

## 2020-07-28 DIAGNOSIS — Z01812 Encounter for preprocedural laboratory examination: Secondary | ICD-10-CM | POA: Insufficient documentation

## 2020-07-28 LAB — SARS CORONAVIRUS 2 (TAT 6-24 HRS): SARS Coronavirus 2: NEGATIVE

## 2020-07-29 ENCOUNTER — Encounter (HOSPITAL_COMMUNITY): Payer: Self-pay | Admitting: Orthopaedic Surgery

## 2020-07-29 NOTE — Progress Notes (Signed)
Spoke with pt for pre-op call. Pt denies cardiac history. Pt is treated for HTN and Diabetes. Pt's last A1C was 5.9 on 05/28/20. She states her fasting blood sugar is usually around 77. Instructed pt not to take Metformin in the AM. She is to check her blood sugar when she gets up in the AM and every 2 hours until she leaves for the hospital. If blood sugar is 70 or below, treat with 1/2 cup of clear juice (apple or cranberry) and recheck blood sugar 15 minutes after drinking juice. If blood sugar continues to be 70 or below, call the Short Stay department and ask to speak to a nurse. Pt voiced understanding.  Covid test done 07/28/20 and it's negative. Pt states she's been in quarantine since the test was done and understands that she stays in quarantine until she comes to the hospital tomorrow.

## 2020-07-30 ENCOUNTER — Observation Stay (HOSPITAL_COMMUNITY)
Admission: RE | Admit: 2020-07-30 | Discharge: 2020-07-31 | Disposition: A | Discharge status: Home / Self Care | Payer: Self-pay | Attending: Orthopaedic Surgery | Admitting: Orthopaedic Surgery

## 2020-07-30 ENCOUNTER — Encounter (HOSPITAL_COMMUNITY)
Admission: RE | Disposition: A | Discharge status: Home / Self Care | Payer: Self-pay | Source: Home / Self Care | Attending: Orthopaedic Surgery

## 2020-07-30 ENCOUNTER — Ambulatory Visit (HOSPITAL_COMMUNITY): Payer: Self-pay | Admitting: Anesthesiology

## 2020-07-30 ENCOUNTER — Ambulatory Visit (HOSPITAL_COMMUNITY): Payer: Self-pay

## 2020-07-30 ENCOUNTER — Other Ambulatory Visit: Payer: Self-pay

## 2020-07-30 ENCOUNTER — Encounter (HOSPITAL_COMMUNITY): Payer: Self-pay | Admitting: Orthopaedic Surgery

## 2020-07-30 DIAGNOSIS — I739 Peripheral vascular disease, unspecified: Secondary | ICD-10-CM | POA: Insufficient documentation

## 2020-07-30 DIAGNOSIS — M4802 Spinal stenosis, cervical region: Secondary | ICD-10-CM

## 2020-07-30 DIAGNOSIS — N189 Chronic kidney disease, unspecified: Secondary | ICD-10-CM | POA: Insufficient documentation

## 2020-07-30 DIAGNOSIS — Z87891 Personal history of nicotine dependence: Secondary | ICD-10-CM | POA: Insufficient documentation

## 2020-07-30 DIAGNOSIS — M50222 Other cervical disc displacement at C5-C6 level: Secondary | ICD-10-CM | POA: Insufficient documentation

## 2020-07-30 DIAGNOSIS — E1122 Type 2 diabetes mellitus with diabetic chronic kidney disease: Secondary | ICD-10-CM | POA: Insufficient documentation

## 2020-07-30 DIAGNOSIS — Z419 Encounter for procedure for purposes other than remedying health state, unspecified: Secondary | ICD-10-CM

## 2020-07-30 DIAGNOSIS — M4712 Other spondylosis with myelopathy, cervical region: Principal | ICD-10-CM | POA: Insufficient documentation

## 2020-07-30 DIAGNOSIS — I129 Hypertensive chronic kidney disease with stage 1 through stage 4 chronic kidney disease, or unspecified chronic kidney disease: Secondary | ICD-10-CM | POA: Insufficient documentation

## 2020-07-30 HISTORY — PX: ANTERIOR CERVICAL DECOMP/DISCECTOMY FUSION: SHX1161

## 2020-07-30 LAB — CBC
HCT: 42.4 % (ref 36.0–46.0)
Hemoglobin: 14.2 g/dL (ref 12.0–15.0)
MCH: 31 pg (ref 26.0–34.0)
MCHC: 33.5 g/dL (ref 30.0–36.0)
MCV: 92.6 fL (ref 80.0–100.0)
Platelets: 234 K/uL (ref 150–400)
RBC: 4.58 MIL/uL (ref 3.87–5.11)
RDW: 12.8 % (ref 11.5–15.5)
WBC: 6.7 K/uL (ref 4.0–10.5)
nRBC: 0 % (ref 0.0–0.2)

## 2020-07-30 LAB — BASIC METABOLIC PANEL
Anion gap: 8 (ref 5–15)
BUN: 8 mg/dL (ref 6–20)
CO2: 26 mmol/L (ref 22–32)
Calcium: 9.3 mg/dL (ref 8.9–10.3)
Chloride: 105 mmol/L (ref 98–111)
Creatinine, Ser: 0.76 mg/dL (ref 0.44–1.00)
GFR, Estimated: >60 mL/min (ref >60)
Glucose, Bld: 92 mg/dL (ref 70–99)
Potassium: 4.6 mmol/L (ref 3.5–5.1)
Sodium: 139 mmol/L (ref 135–145)

## 2020-07-30 LAB — GLUCOSE, CAPILLARY
Glucose-Capillary: 103 mg/dL — ABNORMAL HIGH (ref 70–99)
Glucose-Capillary: 113 mg/dL — ABNORMAL HIGH (ref 70–99)
Glucose-Capillary: 194 mg/dL — ABNORMAL HIGH (ref 70–99)
Glucose-Capillary: 187 mg/dL — ABNORMAL HIGH (ref 70–99)

## 2020-07-30 LAB — SURGICAL PCR SCREEN
MRSA, PCR: NEGATIVE
Staphylococcus aureus: NEGATIVE

## 2020-07-30 SURGERY — ANTERIOR CERVICAL DECOMPRESSION/DISCECTOMY FUSION 1 LEVEL
Anesthesia: General

## 2020-07-30 MED ORDER — DOCUSATE SODIUM 100 MG PO CAPS
100.0000 mg | ORAL_CAPSULE | Freq: Two times a day (BID) | ORAL | Status: DC
  Administered 2020-07-30 – 2020-07-31 (×2): 100 mg via ORAL
  Filled 2020-07-30 (×2): qty 1

## 2020-07-30 MED ORDER — FENTANYL CITRATE (PF) 250 MCG/5ML IJ SOLN
INTRAMUSCULAR | Status: AC
  Filled 2020-07-30: qty 5

## 2020-07-30 MED ORDER — DEXMEDETOMIDINE (PRECEDEX) IN NS 20 MCG/5ML (4 MCG/ML) IV SYRINGE
PREFILLED_SYRINGE | INTRAVENOUS | Status: DC | PRN
  Administered 2020-07-30: 4 ug via INTRAVENOUS
  Administered 2020-07-30: 8 ug via INTRAVENOUS
  Administered 2020-07-30: 4 ug via INTRAVENOUS

## 2020-07-30 MED ORDER — LIDOCAINE 2% (20 MG/ML) 5 ML SYRINGE
INTRAMUSCULAR | Status: DC | PRN
  Administered 2020-07-30: 40 mg via INTRAVENOUS
  Administered 2020-07-30: 60 mg via INTRAVENOUS

## 2020-07-30 MED ORDER — LACTATED RINGERS IV SOLN: INTRAVENOUS | Status: DC | PRN

## 2020-07-30 MED ORDER — HYDROMORPHONE HCL 1 MG/ML IJ SOLN
0.2500 mg | INTRAMUSCULAR | Status: AC | PRN
  Administered 2020-07-30 (×4): 0.5 mg via INTRAVENOUS

## 2020-07-30 MED ORDER — BUPIVACAINE-EPINEPHRINE 0.5% -1:200000 IJ SOLN
INTRAMUSCULAR | Status: AC
  Filled 2020-07-30: qty 1

## 2020-07-30 MED ORDER — METHOCARBAMOL 1000 MG/10ML IJ SOLN
500.0000 mg | Freq: Four times a day (QID) | INTRAMUSCULAR | Status: DC | PRN
  Filled 2020-07-30: qty 5

## 2020-07-30 MED ORDER — FLUTICASONE PROPIONATE 50 MCG/ACT NA SUSP
2.0000 | Freq: Every day | NASAL | Status: DC | PRN
  Filled 2020-07-30: qty 16

## 2020-07-30 MED ORDER — OXYCODONE HCL 5 MG/5ML PO SOLN: 5.0000 mg | Freq: Once | ORAL | Status: DC | PRN

## 2020-07-30 MED ORDER — 0.9 % SODIUM CHLORIDE (POUR BTL) OPTIME
TOPICAL | Status: DC | PRN
  Administered 2020-07-30: 1000 mL

## 2020-07-30 MED ORDER — POLYETHYLENE GLYCOL 3350 17 G PO PACK
17.0000 g | PACK | Freq: Every day | ORAL | Status: DC
  Administered 2020-07-30 – 2020-07-31 (×2): 17 g via ORAL
  Filled 2020-07-30 (×2): qty 1

## 2020-07-30 MED ORDER — SUGAMMADEX SODIUM 200 MG/2ML IV SOLN
INTRAVENOUS | Status: DC | PRN
  Administered 2020-07-30: 200 mg via INTRAVENOUS

## 2020-07-30 MED ORDER — BUPIVACAINE-EPINEPHRINE 0.5% -1:200000 IJ SOLN
INTRAMUSCULAR | Status: DC | PRN
  Administered 2020-07-30: 6 mL

## 2020-07-30 MED ORDER — CEFAZOLIN SODIUM-DEXTROSE 2-4 GM/100ML-% IV SOLN
2.0000 g | INTRAVENOUS | Status: AC
  Administered 2020-07-30: 2 g via INTRAVENOUS

## 2020-07-30 MED ORDER — FENTANYL CITRATE (PF) 250 MCG/5ML IJ SOLN
INTRAMUSCULAR | Status: DC | PRN
  Administered 2020-07-30: 25 ug via INTRAVENOUS
  Administered 2020-07-30: 100 ug via INTRAVENOUS
  Administered 2020-07-30: 50 ug via INTRAVENOUS
  Administered 2020-07-30: 25 ug via INTRAVENOUS
  Administered 2020-07-30: 50 ug via INTRAVENOUS

## 2020-07-30 MED ORDER — MIDAZOLAM HCL 2 MG/2ML IJ SOLN
INTRAMUSCULAR | Status: DC | PRN
  Administered 2020-07-30: 2 mg via INTRAVENOUS

## 2020-07-30 MED ORDER — LACTATED RINGERS IV SOLN: INTRAVENOUS | Status: DC

## 2020-07-30 MED ORDER — ESCITALOPRAM OXALATE 10 MG PO TABS
10.0000 mg | ORAL_TABLET | Freq: Every day | ORAL | Status: DC
  Administered 2020-07-30 – 2020-07-31 (×2): 10 mg via ORAL
  Filled 2020-07-30 (×2): qty 1

## 2020-07-30 MED ORDER — PHENOL 1.4 % MT LIQD
1.0000 | OROMUCOSAL | Status: DC | PRN
Start: 2020-07-30 — End: 2020-07-31

## 2020-07-30 MED ORDER — HYDROMORPHONE HCL 1 MG/ML IJ SOLN
INTRAMUSCULAR | Status: AC
  Filled 2020-07-30: qty 1

## 2020-07-30 MED ORDER — OXYCODONE HCL 5 MG PO TABS: 5.0000 mg | ORAL_TABLET | Freq: Once | ORAL | Status: DC | PRN

## 2020-07-30 MED ORDER — CHLORHEXIDINE GLUCONATE 0.12 % MT SOLN: 15.0000 mL | Freq: Once | OROMUCOSAL | Status: AC

## 2020-07-30 MED ORDER — AMISULPRIDE (ANTIEMETIC) 5 MG/2ML IV SOLN: 10.0000 mg | Freq: Once | INTRAVENOUS | Status: DC | PRN

## 2020-07-30 MED ORDER — ACETAMINOPHEN 325 MG PO TABS: 650.0000 mg | ORAL_TABLET | ORAL | Status: DC | PRN

## 2020-07-30 MED ORDER — MIDAZOLAM HCL 2 MG/2ML IJ SOLN
INTRAMUSCULAR | Status: AC
  Filled 2020-07-30: qty 2

## 2020-07-30 MED ORDER — PANTOPRAZOLE SODIUM 40 MG PO TBEC
40.0000 mg | DELAYED_RELEASE_TABLET | Freq: Every day | ORAL | Status: DC
  Administered 2020-07-31: 40 mg via ORAL
  Filled 2020-07-30: qty 1

## 2020-07-30 MED ORDER — PROPOFOL 10 MG/ML IV BOLUS
INTRAVENOUS | Status: DC | PRN
  Administered 2020-07-30: 50 mg via INTRAVENOUS
  Administered 2020-07-30: 30 mg via INTRAVENOUS
  Administered 2020-07-30: 150 mg via INTRAVENOUS
  Administered 2020-07-30: 20 mg via INTRAVENOUS

## 2020-07-30 MED ORDER — SODIUM CHLORIDE 0.9% FLUSH: 3.0000 mL | INTRAVENOUS | Status: DC | PRN

## 2020-07-30 MED ORDER — FENTANYL CITRATE (PF) 100 MCG/2ML IJ SOLN
25.0000 ug | INTRAMUSCULAR | Status: DC | PRN
  Administered 2020-07-30 (×2): 50 ug via INTRAVENOUS

## 2020-07-30 MED ORDER — METHOCARBAMOL 500 MG PO TABS
500.0000 mg | ORAL_TABLET | Freq: Four times a day (QID) | ORAL | Status: DC | PRN
  Administered 2020-07-30 – 2020-07-31 (×3): 500 mg via ORAL
  Filled 2020-07-30 (×3): qty 1

## 2020-07-30 MED ORDER — KETOROLAC TROMETHAMINE 30 MG/ML IJ SOLN
INTRAMUSCULAR | Status: AC
  Filled 2020-07-30: qty 1

## 2020-07-30 MED ORDER — MENTHOL 3 MG MT LOZG: 1.0000 | LOZENGE | OROMUCOSAL | Status: DC | PRN

## 2020-07-30 MED ORDER — ACETAMINOPHEN 650 MG RE SUPP: 650.0000 mg | RECTAL | Status: DC | PRN

## 2020-07-30 MED ORDER — ROSUVASTATIN CALCIUM 20 MG PO TABS
20.0000 mg | ORAL_TABLET | Freq: Every day | ORAL | Status: DC
  Administered 2020-07-30 – 2020-07-31 (×2): 20 mg via ORAL
  Filled 2020-07-30 (×2): qty 1

## 2020-07-30 MED ORDER — GABAPENTIN 300 MG PO CAPS
600.0000 mg | ORAL_CAPSULE | Freq: Two times a day (BID) | ORAL | Status: DC
  Administered 2020-07-30 – 2020-07-31 (×2): 600 mg via ORAL
  Filled 2020-07-30 (×2): qty 2

## 2020-07-30 MED ORDER — ONDANSETRON HCL 4 MG/2ML IJ SOLN: 4.0000 mg | Freq: Once | INTRAMUSCULAR | Status: DC | PRN

## 2020-07-30 MED ORDER — OXYCODONE-ACETAMINOPHEN 5-325 MG PO TABS: 1.0000 | ORAL_TABLET | ORAL | 0 refills | Status: DC | PRN

## 2020-07-30 MED ORDER — ONDANSETRON HCL 4 MG/2ML IJ SOLN: 4.0000 mg | Freq: Four times a day (QID) | INTRAMUSCULAR | Status: DC | PRN

## 2020-07-30 MED ORDER — AMLODIPINE BESYLATE 5 MG PO TABS
5.0000 mg | ORAL_TABLET | Freq: Every day | ORAL | Status: DC
  Filled 2020-07-30: qty 1

## 2020-07-30 MED ORDER — DEXAMETHASONE SODIUM PHOSPHATE 10 MG/ML IJ SOLN
INTRAMUSCULAR | Status: DC | PRN
  Administered 2020-07-30: 10 mg via INTRAVENOUS

## 2020-07-30 MED ORDER — CEFAZOLIN SODIUM-DEXTROSE 2-4 GM/100ML-% IV SOLN
INTRAVENOUS | Status: AC
  Filled 2020-07-30: qty 100

## 2020-07-30 MED ORDER — KETOROLAC TROMETHAMINE 30 MG/ML IJ SOLN
30.0000 mg | Freq: Once | INTRAMUSCULAR | Status: AC
  Administered 2020-07-30: 30 mg via INTRAVENOUS

## 2020-07-30 MED ORDER — DEXMEDETOMIDINE (PRECEDEX) IN NS 20 MCG/5ML (4 MCG/ML) IV SYRINGE
PREFILLED_SYRINGE | INTRAVENOUS | Status: AC
  Filled 2020-07-30: qty 5

## 2020-07-30 MED ORDER — ONDANSETRON HCL 4 MG PO TABS: 4.0000 mg | ORAL_TABLET | Freq: Four times a day (QID) | ORAL | Status: DC | PRN

## 2020-07-30 MED ORDER — SODIUM CHLORIDE 0.9% FLUSH
3.0000 mL | Freq: Two times a day (BID) | INTRAVENOUS | Status: DC
  Administered 2020-07-30: 3 mL via INTRAVENOUS

## 2020-07-30 MED ORDER — HYDROMORPHONE HCL 1 MG/ML IJ SOLN: 0.5000 mg | INTRAMUSCULAR | Status: DC | PRN

## 2020-07-30 MED ORDER — ONDANSETRON HCL 4 MG/2ML IJ SOLN
INTRAMUSCULAR | Status: DC | PRN
  Administered 2020-07-30: 4 mg via INTRAVENOUS

## 2020-07-30 MED ORDER — ORAL CARE MOUTH RINSE: 15.0000 mL | Freq: Once | OROMUCOSAL | Status: AC

## 2020-07-30 MED ORDER — OXYCODONE HCL 5 MG PO TABS
5.0000 mg | ORAL_TABLET | ORAL | Status: DC | PRN
  Administered 2020-07-30 – 2020-07-31 (×6): 10 mg via ORAL
  Filled 2020-07-30 (×6): qty 2

## 2020-07-30 MED ORDER — CHLORHEXIDINE GLUCONATE 0.12 % MT SOLN
OROMUCOSAL | Status: AC
  Administered 2020-07-30: 15 mL via OROMUCOSAL
  Filled 2020-07-30: qty 15

## 2020-07-30 MED ORDER — SODIUM CHLORIDE 0.9 % IV SOLN: INTRAVENOUS | Status: DC

## 2020-07-30 MED ORDER — KETAMINE HCL 50 MG/5ML IJ SOSY
PREFILLED_SYRINGE | INTRAMUSCULAR | Status: AC
  Filled 2020-07-30: qty 10

## 2020-07-30 MED ORDER — QUETIAPINE FUMARATE 25 MG PO TABS
25.0000 mg | ORAL_TABLET | Freq: Every day | ORAL | Status: DC
  Administered 2020-07-30: 25 mg via ORAL
  Filled 2020-07-30 (×2): qty 1

## 2020-07-30 MED ORDER — DICYCLOMINE HCL 10 MG PO CAPS
10.0000 mg | ORAL_CAPSULE | Freq: Every day | ORAL | Status: DC
  Filled 2020-07-30: qty 1

## 2020-07-30 MED ORDER — KETAMINE HCL 10 MG/ML IJ SOLN
INTRAMUSCULAR | Status: DC | PRN
  Administered 2020-07-30: 10 mg via INTRAVENOUS
  Administered 2020-07-30: 30 mg via INTRAVENOUS

## 2020-07-30 MED ORDER — FENTANYL CITRATE (PF) 100 MCG/2ML IJ SOLN
INTRAMUSCULAR | Status: AC
  Filled 2020-07-30: qty 2

## 2020-07-30 MED ORDER — ROCURONIUM BROMIDE 10 MG/ML (PF) SYRINGE
PREFILLED_SYRINGE | INTRAVENOUS | Status: DC | PRN
  Administered 2020-07-30: 40 mg via INTRAVENOUS
  Administered 2020-07-30: 60 mg via INTRAVENOUS

## 2020-07-30 MED ORDER — METFORMIN HCL 500 MG PO TABS
500.0000 mg | ORAL_TABLET | Freq: Every day | ORAL | Status: DC
  Administered 2020-07-31: 500 mg via ORAL
  Filled 2020-07-30: qty 1

## 2020-07-30 MED ORDER — HEMOSTATIC AGENTS (NO CHARGE) OPTIME
TOPICAL | Status: DC | PRN
  Administered 2020-07-30: 1 via TOPICAL

## 2020-07-30 SURGICAL SUPPLY — 52 items
BENZOIN TINCTURE PRP APPL 2/3 (GAUZE/BANDAGES/DRESSINGS) ×2 IMPLANT
BONE CC-ACS 11X14X7 6D (Bone Implant) ×2 IMPLANT
BUR ROUND FLUTED 4 SOFT TCH (BURR) ×2 IMPLANT
CHIPS BONE CANC-ACS11X14X7 6D (Bone Implant) ×1 IMPLANT
CLSR STERI-STRIP ANTIMIC 1/2X4 (GAUZE/BANDAGES/DRESSINGS) ×2 IMPLANT
COLLAR CERV LO CONTOUR FIRM DE (SOFTGOODS) ×2 IMPLANT
COVER MAYO STAND STRL (DRAPES) ×2 IMPLANT
COVER SURGICAL LIGHT HANDLE (MISCELLANEOUS) ×2 IMPLANT
COVER WAND RF STERILE (DRAPES) ×2 IMPLANT
DRAPE C-ARM 42X72 X-RAY (DRAPES) ×2 IMPLANT
DRAPE HALF SHEET 40X57 (DRAPES) ×2 IMPLANT
DRAPE MICROSCOPE LEICA (MISCELLANEOUS) ×2 IMPLANT
DURAPREP 6ML APPLICATOR 50/CS (WOUND CARE) ×2 IMPLANT
ELECT COATED BLADE 2.86 ST (ELECTRODE) ×2 IMPLANT
ELECT REM PT RETURN 9FT ADLT (ELECTROSURGICAL) ×2
ELECTRODE REM PT RTRN 9FT ADLT (ELECTROSURGICAL) ×1 IMPLANT
EVACUATOR 1/8 PVC DRAIN (DRAIN) ×2 IMPLANT
GAUZE SPONGE 4X4 12PLY STRL (GAUZE/BANDAGES/DRESSINGS) ×2 IMPLANT
GLOVE ORTHO TXT STRL SZ7.5 (GLOVE) ×4 IMPLANT
GLOVE SRG 8 PF TXTR STRL LF DI (GLOVE) ×2 IMPLANT
GLOVE SURG UNDER POLY LF SZ8 (GLOVE) ×2
GOWN STRL REUS W/ TWL LRG LVL3 (GOWN DISPOSABLE) ×1 IMPLANT
GOWN STRL REUS W/ TWL XL LVL3 (GOWN DISPOSABLE) ×1 IMPLANT
GOWN STRL REUS W/TWL 2XL LVL3 (GOWN DISPOSABLE) ×2 IMPLANT
GOWN STRL REUS W/TWL LRG LVL3 (GOWN DISPOSABLE) ×1
GOWN STRL REUS W/TWL XL LVL3 (GOWN DISPOSABLE) ×1
HALTER HD/CHIN CERV TRACTION D (MISCELLANEOUS) ×2 IMPLANT
KIT BASIN OR (CUSTOM PROCEDURE TRAY) ×2 IMPLANT
KIT TURNOVER KIT B (KITS) ×2 IMPLANT
MANIFOLD NEPTUNE II (INSTRUMENTS) ×2 IMPLANT
NEEDLE 25GX 5/8IN NON SAFETY (NEEDLE) ×2 IMPLANT
NS IRRIG 1000ML POUR BTL (IV SOLUTION) ×2 IMPLANT
PACK ORTHO CERVICAL (CUSTOM PROCEDURE TRAY) ×2 IMPLANT
PAD ARMBOARD 7.5X6 YLW CONV (MISCELLANEOUS) ×4 IMPLANT
PATTIES SURGICAL .5 X.5 (GAUZE/BANDAGES/DRESSINGS) ×2 IMPLANT
PIN FIXATION SMALL TEMP (ORTHOPEDIC DISPOSABLE SUPPLIES) ×2 IMPLANT
PIN TEMP FIXATION KIRSCHNER (EXFIX) ×2 IMPLANT
PLATE ANT CERV XTEND 1 LV 14 (Plate) ×2 IMPLANT
POSITIONER HEAD DONUT 9IN (MISCELLANEOUS) ×2 IMPLANT
SCREW SELF TAP VARIABLE 4.6X12 (Screw) ×2 IMPLANT
SCREW XTD VAR 4.2 SELF TAP 12 (Screw) ×8 IMPLANT
STRIP CLOSURE SKIN 1/2X4 (GAUZE/BANDAGES/DRESSINGS) ×2 IMPLANT
SURGIFLO W/THROMBIN 8M KIT (HEMOSTASIS) ×2 IMPLANT
SUT BONE WAX W31G (SUTURE) ×2 IMPLANT
SUT VIC AB 3-0 PS2 18 (SUTURE) ×1
SUT VIC AB 3-0 PS2 18XBRD (SUTURE) ×1 IMPLANT
SUT VIC AB 4-0 PS2 27 (SUTURE) ×2 IMPLANT
SYR BULB EAR ULCER 3OZ GRN STR (SYRINGE) ×2 IMPLANT
TAPE CLOTH SURG 4X10 WHT LF (GAUZE/BANDAGES/DRESSINGS) ×2 IMPLANT
TOWEL GREEN STERILE (TOWEL DISPOSABLE) ×2 IMPLANT
TOWEL GREEN STERILE FF (TOWEL DISPOSABLE) ×2 IMPLANT
WATER STERILE IRR 1000ML POUR (IV SOLUTION) ×2 IMPLANT

## 2020-07-30 NOTE — Interval H&P Note (Signed)
History and Physical Interval Note:  07/30/2020 12:12 PM  Isabel Evans  has presented today for surgery, with the diagnosis of c5-6 spondylosis, foraminal stenosis.  The various methods of treatment have been discussed with the patient and family. After consideration of risks, benefits and other options for treatment, the patient has consented to  Procedure(s): C5-6 ANTERIOR CERVICAL DECOMPRESSION/DISCECTOMY FUSION, ALLOGRAFT, PLATE (N/A) as a surgical intervention.  The patient's history has been reviewed, patient examined, no change in status, stable for surgery.  I have reviewed the patient's chart and labs.  Questions were answered to the patient's satisfaction.     Marybelle Killings

## 2020-07-30 NOTE — Anesthesia Preprocedure Evaluation (Signed)
Anesthesia Evaluation  Patient identified by MRN, date of birth, ID band Patient awake    Reviewed: Allergy & Precautions, NPO status , Patient's Chart, lab work & pertinent test results  History of Anesthesia Complications Negative for: history of anesthetic complications  Airway Mallampati: II  TM Distance: >3 FB Neck ROM: Full    Dental  (+) Missing, Dental Advisory Given   Pulmonary neg pulmonary ROS, Patient abstained from smoking., former smoker,    Pulmonary exam normal        Cardiovascular hypertension, Normal cardiovascular exam     Neuro/Psych Anxiety Depression    GI/Hepatic Neg liver ROS, GERD  ,  Endo/Other  diabetes, Type 2, Oral Hypoglycemic Agents  Renal/GU negative Renal ROS  negative genitourinary   Musculoskeletal  (+) Arthritis , Fibromyalgia -  Abdominal   Peds  Hematology negative hematology ROS (+)   Anesthesia Other Findings   Reproductive/Obstetrics                           Anesthesia Physical Anesthesia Plan  ASA: II  Anesthesia Plan: General   Post-op Pain Management:    Induction: Intravenous  PONV Risk Score and Plan: 3 and Ondansetron, Dexamethasone, Treatment may vary due to age or medical condition and Midazolam  Airway Management Planned: Oral ETT and Video Laryngoscope Planned  Additional Equipment: None  Intra-op Plan:   Post-operative Plan: Extubation in OR  Informed Consent: I have reviewed the patients History and Physical, chart, labs and discussed the procedure including the risks, benefits and alternatives for the proposed anesthesia with the patient or authorized representative who has indicated his/her understanding and acceptance.     Dental advisory given  Plan Discussed with:   Anesthesia Plan Comments:         Anesthesia Quick Evaluation

## 2020-07-30 NOTE — Op Note (Signed)
Preop diagnosis: C5-6 cervical spondylosis, disc protrusion, foraminal stenosis.  Postop diagnosis: Same  Procedure: C5-6 anterior cervical discectomy and fusion, allograft and plate.  Surgeon: Rodell Perna, MD  Anesthesia General orotracheal by scope intubation with 6 cc Marcaine local.  Assistant: Benjiman Core, PA-C medically necessary and present with entire procedure  Implants:GlobusXtend 14 mm plate.  12 mm screws x3.  112 mm rescue screw left at C5.  7 mm MTF cortical cancellous allograft at C5-6 level.  EBL: Minimal  Drains: 1 Hemovac neck.  Procedure after induction general anesthesia orotracheal ovation arms tucked at the side had all traction without weight wrist restraints were used for pulldown during fluoroscopic imaging neck was prepped with DuraPrep there is squared with towel sterile skin marker Betadine Steri-Drape sterile Mayo stand the head and thyroid sheets and drapes.  Timeout procedure completed Ancef prophylaxis.  Incision started the middle extending to the left platysma was split in line with the fibers blunt dissection down to the prominent C5-6 spurs.  Short 25 needle and crosstable lateral C arm image sterilely draped confirmed this was the appropriate expected level.  Operative microscope was draped brought in discectomy was performed spurs removed anteriorly.  We progressed back to the posterior longitudinal ligament taking protruding disc material taking down the posterior longitudinal ligament decompressing the dura and uncovertebral joint stripping opening up the foramina right and left.  Trial sizers first with a 6 followed by 7 graft.  7 was selected.  Some Surgi-Flo was used at the depth there is minimal epidural bleeding at the time of graft placement it was dry.  Graft was countersunk 2 mm and 14 mm plate was selected all screws were failed.  Screw on the left side angled cephalad did not violate the endplate but was not parallel to the other screw we backed it  out tries trying to redirect it slightly parallel to the screw on the right and then finally selected a rescue screw making sure that it went directly in line with the first screw so the 2 screws were parallel on the lateral image.  Graft was in good position screws and C6 were in good position.  Screws were locked in with a tiny screwdriver Hemovac placed within technique platysma closed with 0 Vicryl.  There is a prominent vein on the left side noted on entry not external jugular but was carefully preserved throughout the case and was 4 mm vein.  It was intact at time of closure.  4-0 Vicryl subcuticular closure tincture benzoin Steri-Strips 4 x 4's tape and soft collar was applied postoperatively.  Patient was transferred recovery in stable condition.

## 2020-07-30 NOTE — Progress Notes (Signed)
Orthopedic Tech Progress Note Patient Details:  Isabel Evans [birth year] [id number] RN called requesting a SOFT COLLAR  Ortho Devices Type of Ortho Device: Soft collar Ortho Device/Splint Interventions: Ordered   Post Interventions Patient Tolerated: Well Instructions Provided: [facility] of [name] 07/30/2020, 5:52 PM

## 2020-07-30 NOTE — Addendum Note (Signed)
Addendum  created 07/30/20 1539 by Rande Brunt, CRNA   Charge Capture section accepted

## 2020-07-30 NOTE — Transfer of Care (Signed)
Immediate Anesthesia Transfer of Care Note  Patient: Isabel Evans  Procedure(s) Performed: C5-6 ANTERIOR CERVICAL DECOMPRESSION/DISCECTOMY FUSION, ALLOGRAFT, PLATE (N/A )  Patient Location: PACU  Anesthesia Type:General  Level of Consciousness: awake, alert  and oriented  Airway & Oxygen Therapy: Patient Spontanous Breathing  Post-op Assessment: Report given to RN, Post -op Vital signs reviewed and stable and Patient moving all extremities  Post vital signs: Reviewed and stable  Last Vitals:  Vitals Value Taken Time  BP 101/69   Temp    Pulse 73 07/30/20 1434  Resp 16 07/30/20 1434  SpO2 97 % 07/30/20 1434  Vitals shown include unvalidated device data.  Last Pain:  Vitals:   07/30/20 1007  TempSrc: Oral         Complications: No complications documented.

## 2020-07-30 NOTE — Discharge Instructions (Signed)
Ok to shower 5 days postop.  Do not apply any creams or ointments to incision.  Do not remove steri-strips.  Can use 4x4 gauze and tape for dressing changes.   No aggressive activity. No lifting, pushing, pulling  No driving  Cervical collar must be on at all times even when showering.  Do not bend or turn neck.

## 2020-07-30 NOTE — Anesthesia Procedure Notes (Addendum)
Procedure Name: Intubation Date/Time: 07/30/2020 12:56 PM Performed by: Rande Brunt, CRNA Pre-anesthesia Checklist: Patient identified, Emergency Drugs available, Suction available and Patient being monitored Patient Re-evaluated:Patient Re-evaluated prior to induction Oxygen Delivery Method: Circle system utilized Preoxygenation: Pre-oxygenation with 100% oxygen Induction Type: IV induction Ventilation: Mask ventilation without difficulty Laryngoscope Size: Glidescope and 4 Grade View: Grade I Tube type: Oral Tube size: 7.0 mm Number of attempts: 1 Airway Equipment and Method: Stylet and Oral airway Placement Confirmation: ETT inserted through vocal cords under direct vision,  positive ETCO2 and breath sounds checked- equal and bilateral Secured at: 22 cm Tube secured with: Tape Dental Injury: Teeth and Oropharynx as per pre-operative assessment

## 2020-07-30 NOTE — Anesthesia Postprocedure Evaluation (Signed)
Anesthesia Post Note  Patient: Isabel Evans  Procedure(s) Performed: C5-6 ANTERIOR CERVICAL DECOMPRESSION/DISCECTOMY FUSION, ALLOGRAFT, PLATE (N/A )     Patient location during evaluation: PACU Anesthesia Type: General Level of consciousness: awake and alert Pain management: pain level controlled Vital Signs Assessment: post-procedure vital signs reviewed and stable Respiratory status: spontaneous breathing, nonlabored ventilation and respiratory function stable Cardiovascular status: blood pressure returned to baseline and stable Postop Assessment: no apparent nausea or vomiting Anesthetic complications: no   No complications documented.  Last Vitals:  Vitals:   07/30/20 1448 07/30/20 1504  BP: (!) 143/87 124/84  Pulse: 75 69  Resp: 18 14  Temp:    SpO2: 100% 100%    Last Pain:  Vitals:   07/30/20 1448  TempSrc:   PainSc: 8                  Lidia Collum

## 2020-07-30 NOTE — H&P (Signed)
Assessment & Plan: Visit Diagnoses:  1. Other spondylosis with radiculopathy, cervical region     Plan: Patient has progressive endplate degeneration comparison MRI 2019 at the C5-6 level basically single level disease.  She now has mild stenosis and moderate foraminal stenosis left worse than right with mild cord mass-effect.  Her symptoms are worse it bothers her daily at work.  She has been treated with anti-inflammatories prednisone Dosepak, therapy exercise program, muscle relaxants, topical medication, symptoms times greater than 3 years with progressive symptoms to the point where she is having great trouble working.  She states she needs to get something done otherwise she is not able to continue work and is concerned she might have to go on disability.  MRI scan was reviewed we discussed single level cervical fusion overnight stay 6 weeks in the collar and she should be able to resume normal work activity.  Risks of surgery discussed.  Dysphagia dysphonia, low risk of pseudoarthrosis for single level fusion discussed.  Questions were elicited and answered she understands request to proceed.  Follow-Up Instructions: No follow-ups on file.   Orders:  No orders of the defined types were placed in this encounter.  No orders of the defined types were placed in this encounter.     Procedures: No procedures performed   Clinical Data: No additional findings.   Subjective:     Chief Complaint  Patient presents with  . 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  She has had Dupuytren's release in the past treated at [facility] with early recurrence within a few months.  She does have the Dupuytren's in the right hand but  not as severe.  Patient's had persistent neck pain pain that radiates into her arm and down into the radial side of her left hand more than right hand.  MRI scan has been obtained and shows progression of stenosis and foraminal stenosis at C5-6.  Review of Systems  Dupuytren's right and left hand left hand recurrent Dupuytren's with scars.. Chronic neck pain disc fusion at C5-6 by cervical MRI scan. Previous mild lumbar protrusions L2-3, L4-5 and L5-S1. Patient reports history of fibromyalgia all other systems noncontributory to HPI   Objective: Vital Signs: BP 123/84   Pulse 80   Ht 5\' 11"  (1.803 m)   Wt 157 lb (71.2 kg)   LMP 07/04/2017   BMI 21.90 kg/m   Physical Exam Constitutional:      Appearance: She is well-developed.  HENT:     Head: Normocephalic.     Right Ear: External ear normal.     Left Ear: External ear normal.  Eyes:     Pupils: Pupils are equal, round, and reactive to light.  Neck:     Thyroid: No thyromegaly.     Trachea: No tracheal deviation.  Cardiovascular:     Rate and Rhythm: Normal rate.  Pulmonary:     Effort: Pulmonary effort is normal.  Abdominal:     Palpations: Abdomen is soft.  Skin:    General: Skin is warm and dry.  Neurological:     Mental Status: She is alert and oriented to person, place, and time.  Psychiatric:        Mood and  Affect: Mood and affect normal.        Behavior: Behavior normal.     Ortho Exam bilateral Dupuytren's recurrent left hand with small finger PIP MCP contracture.  Good capillary refill the fingertips.  Patient has brachial plexus tenderness left greater than right.  Positive Spurling left greater than right.  Decreased sensation radial side of her hands. Specialty Comments:  No specialty comments available.  Imaging:CLINICAL DATA: 50 year old female with cervical spondylosis, disc disease at C5-C6.  EXAM: MRI CERVICAL SPINE WITHOUT CONTRAST  TECHNIQUE: Multiplanar, multisequence MR imaging  of the cervical spine was performed. No intravenous contrast was administered.  COMPARISON: Cervical spine MRI 03/15/2018. Cervical spine radiographs 02/13/2018.  FINDINGS: Alignment: Straightening of cervical lordosis has not significantly changed since 2019. No significant spondylolisthesis.  Vertebrae: No marrow edema or evidence of acute osseous abnormality. Chronic degenerative endplate marrow signal changes at C5-C6. Normal background bone marrow signal.  Cord: Normal cervical and visible upper thoracic spinal cord signal and morphology except at C5-C6. No abnormal cord signal identified.  Posterior Fossa, vertebral arteries, paraspinal tissues: Cervicomedullary junction is within normal limits. Negative visible posterior fossa. Dominant left vertebral artery again noted. Stable major vascular flow voids in the neck. The right vertebral is diminutive. Negative visible neck soft tissues, right lung apex.  Disc levels:  C2-C3: Negative.  C3-C4: Stable mild facet hypertrophy greater on the left. Borderline to mild C4 foraminal stenosis.  C4-C5: Negative.  C5-C6: Chronic disc space loss. Circumferential disc bulge with endplate spurring. Broad-based posterior component of disc appears progressed since 2019 (series 6, image 27) along with mild spinal stenosis and mild cord mass effect. Mild to moderate left and mild right C6 foraminal stenosis also appear increased.  C6-C7: Mild left facet hypertrophy. Otherwise negative.  C7-T1: Mild bilateral facet hypertrophy. Stable borderline to mild right C8 foraminal stenosis.  T1-T2: Mild facet hypertrophy.  IMPRESSION: 1. Chronic disc and endplate degeneration at C5-C6 appears progressed since a 2019 MRI along with mild spinal stenosis and mild to moderate left C6 neural foraminal stenosis. Mild cord mass effect, but no cord signal abnormality.  2. Mild cervical spine degeneration elsewhere not  significantly changed.   Electronically Signed By: Genevie Ann M.D. On: 06/05/2020 10:19    PMFS History:     Patient Active Problem List   Diagnosis Date Noted  . Other spondylosis with radiculopathy, cervical region 06/18/2020  . Major depressive disorder, recurrent episode, moderate ([place]) 05/26/2020  . Protrusion of cervical intervertebral disc 03/22/2018  . Anemia 11/24/2017  . Pre-diabetes 11/24/2017  . Blurry vision, bilateral 05/18/2017  . Callus of foot 05/18/2017  . Pain in joint of left hip 02/16/2017  . Bruises easily 11/10/2016  . Gastroesophageal reflux disease without esophagitis 09/22/2016  . Fatigue associated with anemia 09/22/2016  . Flexion contractures 09/22/2016  . Generalized anxiety disorder 01/01/2016  . Dental abscess 01/01/2016  . Healthcare maintenance 05/08/2015  . Adjustment disorder with mixed anxiety and depressed mood 05/08/2015  . Stress at home 10/24/2014  . Irritable bowel syndrome 10/24/2014  . Anxiety state 10/24/2014  . Preop testing 09/19/2014  . Pap smear for cervical cancer screening 09/19/2014  . Midline low back pain without sciatica 08/12/2014  . [place] nodes 08/12/2014  . Contracture of joint, hand 08/12/2014  . Essential hypertension 02/11/2014  . Allergy 02/11/2014  . Screening for breast cancer 02/11/2014  . Dupuytren's contracture of both hands 11/15/2013  . Hallux valgus of right foot 11/15/2013  . Back pain 05/21/2013  .  UTI (urinary tract infection) 05/21/2013  . Multiple skin nodules 11/27/2012  . Fibromyalgia 09/08/2012  . Osteoarthritis 09/08/2012  . Neck muscle spasm 07/14/2012  . Plantar wart 07/14/2012  . Insomnia 07/14/2012       Past Medical History:  Diagnosis Date  . Anemia 11/24/2017  . Anxiety   . Bone spur    cervical spine  . Chronic kidney disease 04/2017   kidney infections  . Depression   . Deviated nasal septum    states is unable to lie flat, because her nose will become  congested and she can stop breathing  . Diabetes mellitus without complication ([place])   . Dysrhythmia    heart flutters occasionally  . Fibromyalgia   . Fibromyalgia   . GERD (gastroesophageal reflux disease)   . Hallux abductovalgus with bunions 09/2014   right   . Hammertoe 09/2014   right 4th, 5th  . History of stomach ulcers   . Hyperlipidemia   . Hypertension    states is under control with med., has been on med. x 8 mos.  . IBS (irritable bowel syndrome)    no current med.  . Osteoarthritis    hands, bilateral hips  . Peripheral vascular disease (H[facility])    occasional swelling in both legs  . Sinus headache          Family History  Problem Relation Age of Onset  . Hypertension Mother   . Diabetes Mother   . Heart disease Mother   . Hypertension Father   . Diabetes Father   . Prostate cancer Father   . Heart disease Father   . Alcohol abuse Father   . Hypertension Sister   . Diabetes Sister   . Heart disease Maternal Grandmother        Great GM  . Breast cancer Maternal Grandmother   . Bone cancer Maternal Grandmother   . Breast cancer Maternal Aunt   . Ovarian cancer Maternal Aunt   . Rheum arthritis Sister   . Colon cancer Other        great Aunt  . Rectal cancer Neg Hx   . Stomach cancer Neg Hx          Past Surgical History:  Procedure Laterality Date  . ABDOMINAL HYSTERECTOMY    . BREAST BIOPSY Left 04/2018   benign  . BREAST BIOPSY Left 2016   benign  . BUNIONECTOMY Right 10/04/2014   Procedure:  Altamese , Emmaline Life;  Surgeon: Trula Slade, DPM;  Location: [facility];  Service: Podiatry;  Laterality: Right;  . COLONOSCOPY WITH PROPOFOL  02/13/2013  . CYSTOSCOPY N/A 07/07/2017   Procedure: CYSTOSCOPY;  Surgeon: Aletha Halim, MD;  Location: [facility];  Service: Gynecology;  Laterality: N/A;  . DUPUYTREN / PALMAR FASCIOTOMY Right 07/18/2013   exc. of multiple nodules   . 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 HAMMER TOE SURGERY Right 10/04/2014   Procedure: HAMMER TOE REPAIR 4TH  AND 5TH  RIGHT FOOT  fourth toe fixation with k wire;  Surgeon: Trula Slade, DPM;  Location: [facility];  Service: Podiatry;  Laterality: Right;  . 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 TUBAL LIGATION    . VAGINAL HYSTERECTOMY N/A 07/07/2017   Procedure: HYSTERECTOMY VAGINAL;  Surgeon: Aletha Halim, MD;  Location: [facility];  Service: Gynecology;  Laterality: N/A;   Social History  Occupational History  . Occupation: cook  Tobacco Use  . Smoking status: Former Smoker    Packs/day: 1.00    Years: 20.00    Pack years: 20.00    Types: Cigarettes    Quit date: 08/24/2012    Years since quitting: 7.8  . Smokeless tobacco: Never Used  Vaping Use  . Vaping Use: Some days  Substance and Sexual Activity  . Alcohol use: Yes    Comment: occasionally  . Drug use: Yes    Types: Marijuana  . Sexual activity: Yes    Birth control/protection: Surgical

## 2020-07-31 ENCOUNTER — Other Ambulatory Visit: Payer: Self-pay

## 2020-07-31 ENCOUNTER — Other Ambulatory Visit (HOSPITAL_COMMUNITY): Payer: Self-pay

## 2020-07-31 ENCOUNTER — Encounter (HOSPITAL_COMMUNITY): Payer: Self-pay | Admitting: Orthopaedic Surgery

## 2020-07-31 LAB — GLUCOSE, CAPILLARY: Glucose-Capillary: 134 mg/dL — ABNORMAL HIGH (ref 70–99)

## 2020-07-31 MED ORDER — OXYCODONE-ACETAMINOPHEN 5-325 MG PO TABS
1.0000 | ORAL_TABLET | ORAL | 0 refills | Status: DC | PRN
  Filled 2020-07-31: qty 50, 9d supply, fill #0

## 2020-07-31 NOTE — Progress Notes (Signed)
Occupational Therapy Evaluation  Pt demonstrates good awareness of cervical precautions and demonstrates excellent safety awareness.  She is able to perform ADLs Mod I.  Reviewed use of reacher and LH bath sponge for home use.  She lives with her s/o, who will be available  to assist her as needed.  All education completed.  OT will sign off at this time.  No follow up OT recommended.    07/31/20 1100  OT Visit Information  Last OT Received On 07/31/20  Assistance Needed +1  History of Present Illness This 50 y.o. female admitted for C5-6 ACDF secondary to disc protrusion and foraminal stenosis.  PMH includes: PVD, oA, IBS, HTN, Fibromyalgia, depression  Precautions  Precautions Cervical  Precaution Booklet Issued Yes (comment)  Precaution Comments reviewed cervical precautions with pt and handout provided  Required Braces or Orthoses Cervical Brace  Cervical Brace Soft collar  Home Living  Family/patient expects to be discharged to: Private residence  Living Arrangements Spouse/significant other  Available Help at Discharge Family;Available 24 hours/day (for first several days then intermittently)  Type of Home Mobile home  Home Access Stairs to enter  Entrance Stairs-Number of Steps 2  Entrance Stairs-Rails None  Home Layout One level  Bathroom Shower/Tub Walk-in Patent examiner - standard  Prior Function  Level of Independence Independent  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 No difficulties  Pain Assessment  Pain Assessment Faces  Faces Pain Scale 4  Pain Location neck  Pain Descriptors / Indicators Operative site guarding  Pain Intervention(s) Monitored during session  Cognition  Arousal/Alertness Awake/alert  Behavior During Therapy WFL for tasks assessed/performed  Overall Cognitive Status Within Functional Limits for tasks assessed  Upper Extremity Assessment  Upper Extremity Assessment Overall WFL  for tasks assessed  Lower Extremity Assessment  Lower Extremity Assessment Overall WFL for tasks assessed  Cervical / Trunk Assessment  Cervical / Trunk Assessment Other exceptions  Cervical / Trunk Exceptions s/p ACDF  ADL  Overall ADL's  Needs assistance/impaired  Eating/Feeding Independent  Grooming Wash/dry hands;Wash/dry face;Oral care;Brushing hair;Modified independent;Standing  Grooming Details (indicate cue type and reason) Pt was instructed in safe technique and was able to return demonstration  Upper Body Bathing Modified independent;Sitting;Standing  Lower Body Bathing Modified independent;Sit to/from stand  Lower Body Bathing Details (indicate cue type and reason) Pt able to perform figure 4  Upper Body Dressing  Modified independent;Sitting  Lower Body Dressing Modified independent;Sit to/from stand  Lower Body Dressing Details (indicate cue type and reason) able to perform figure 4  Toilet Transfer Modified Independent;Regular Toilet  Toileting- Clothing Manipulation and Hygiene Modified independent;Sit to/from stand  Functional mobility during ADLs Modified independent  General ADL Comments Pt demonstrates good safety awareness and good understanding of cervical precautions.  She was instructed how to don/doff her cervical collar and was able to return demonstration  Bed Mobility  Overal bed mobility Modified Independent  General bed mobility comments Pt demosntrates safe technique.  She reports she plans to sleep in recliner at discharge  Transfers  Overall transfer level Modified independent  Balance  Overall balance assessment No apparent balance deficits (not formally assessed)  OT - End of Session  Equipment Utilized During Treatment Cervical collar  Activity Tolerance Patient tolerated treatment well  Patient left in bed;with call bell/phone within reach  Nurse Communication Mobility status  OT Assessment  OT Recommendation/Assessment Patient does not need any  further OT services  OT Visit Diagnosis Pain  Pain - part of body  (neck)  OT Problem List Pain  AM-PAC OT "6 Clicks" Daily Activity Outcome Measure (Version 2)  Help from another person eating meals? 4  Help from another person taking care of personal grooming? 4  Help from another person toileting, which includes using toliet, bedpan, or urinal? 4  Help from another person bathing (including washing, rinsing, drying)? 4  Help from another person to put on and taking off regular upper body clothing? 4  Help from another person to put on and taking off regular lower body clothing? 4  6 Click Score 24  OT Recommendation  Follow Up Recommendations No OT follow up;Supervision - Intermittent  OT Equipment None recommended by OT  Individuals Consulted  Consulted and Agree with Results and Recommendations Patient  Acute Rehab OT Goals  Patient Stated Goal to get better  OT Goal Formulation All assessment and education complete, DC therapy  OT Time Calculation  OT Start Time (ACUTE ONLY) 0744  OT Stop Time (ACUTE ONLY) 0820  OT Time Calculation (min) 36 min  OT General Charges  $OT Visit 1 Visit  OT Evaluation  $OT Eval Low Complexity 1 Low  OT Treatments  $Self Care/Home Management  8-22 mins  Written Expression  Dominant Hand Right  Nilsa Nutting., OTR/L Acute Rehabilitation Services Pager [phone] Office [phone]

## 2020-07-31 NOTE — Progress Notes (Signed)
Patient ambulated for discharged home accompanied by daughter; in no acute distress nor complaints of pain nor discomfort; all belongings taken along with her; discharge instructions given by RN and verbalized understanding.

## 2020-07-31 NOTE — Progress Notes (Signed)
Subjective: Doing well pain controlled. preop arm pain resolved.    Objective: Vital signs in last 24 hours: Temp:  [97.5 F (36.4 C)-98.3 F (36.8 C)] 97.9 F (36.6 C) (04/14 0723) Pulse Rate:  [56-79] 71 (04/14 0723) Resp:  [14-20] 16 (04/14 0723) BP: (101-144)/(69-88) 131/86 (04/14 0723) SpO2:  [97 %-100 %] 100 % (04/14 0723)  Intake/Output from previous day: 04/13 0701 - 04/14 0700 In: 1380 [P.O.:480; I.V.:800; IV Piggyback:100] Out: 40 [Drains:15; Blood:25] Intake/Output this shift: No intake/output data recorded.  Recent Labs    07/30/20 1007  HGB 14.2   Recent Labs    07/30/20 1007  WBC 6.7  RBC 4.58  HCT 42.4  PLT 234   Recent Labs    07/30/20 1007  NA 139  K 4.6  CL 105  CO2 26  BUN 8  CREATININE 0.76  GLUCOSE 92  CALCIUM 9.3   No results for input(s): LABPT, INR in the last 72 hours.  Exam: Drain removed.  NVI.      Assessment/Plan: D/c home.  Script for percocet sent to pharmacy. F/u one week.     Benjiman Core 07/31/2020, 12:24 PM

## 2020-08-02 ENCOUNTER — Encounter: Payer: Self-pay | Admitting: Orthopaedic Surgery

## 2020-08-02 MED FILL — Metformin HCl Tab 500 MG: ORAL | 30 days supply | Qty: 30 | Fill #0 | Status: AC

## 2020-08-04 ENCOUNTER — Telehealth: Payer: Self-pay

## 2020-08-04 ENCOUNTER — Other Ambulatory Visit: Payer: Self-pay

## 2020-08-04 NOTE — Telephone Encounter (Signed)
Transition Care Management Follow-up Telephone Call  Date of discharge and from where: 07/31/2020, [facility]   How have you been since you were released from the hospital? She said she is doing all right.  Taking pain medication as needed  Any questions or concerns? No  Items Reviewed:  Did the pt receive and understand the discharge instructions provided? Yes   Medications obtained and verified? Yes  - she said she has all medications but needs to pick up metformin from pharmacy. No questions about the med regime.   Other? No   Any new allergies since your discharge? No   Do you have support at home? Yes   Home Care and Equipment/Supplies: Were home health services ordered? no If so, what is the name of the agency? n/a  Has the agency set up a time to come to the patient's home? not applicable Were any new equipment or medical supplies ordered?  No What is the name of the medical supply agency? n/a Were you able to get the supplies/equipment? not applicable Do you have any questions related to the use of the equipment or supplies? No  Functional Questionnaire: (I = Independent and D = Dependent) ADLs: independent. Ambulating without an assistive device.   Has been wearing her cervical collar.   Follow up appointments reviewed:   PCP Hospital f/u appt confirmed? No , she said she will call to schedule an appointment with Dr [facility] f/u appt confirmed? Yes , Dr Lorin Mercy - 08/08/2020  And GYN- 08/07/2020.   Are transportation arrangements needed? Yes   If their condition worsens, is the pt aware to call PCP or go to the Emergency Dept.? Yes  Was the patient provided with contact information for the PCP's office or ED? Yes  Was to pt encouraged to call back with questions or concerns? Yes

## 2020-08-04 NOTE — Telephone Encounter (Signed)
From the discharge call. She said she will call to schedule follow up with Dr Margarita Rana.  She has appointment with Dr Lorin Mercy 08/08/2020.   She said she is doing all right.  Taking pain medication as needed  she said she has all medications but needs to pick up metformin from pharmacy. No questions about the med regime.   Ambulating without an assistive device. Has been wearing her cervical collar.

## 2020-08-07 ENCOUNTER — Other Ambulatory Visit: Payer: Self-pay

## 2020-08-07 ENCOUNTER — Encounter: Payer: Self-pay | Admitting: Family Medicine

## 2020-08-07 NOTE — Discharge Summary (Signed)
Patient ID: Isabel Evans MRN: [id number] DOB/AGE: 23-Oct-1970 50 y.o.  Admit date: 07/30/2020 Discharge date: 07/31/2020 Admission Diagnoses:  Active Problems:   Cervical spinal stenosis   Discharge Diagnoses:  Active Problems:   Cervical spinal stenosis  status post Procedure(s): C5-6 ANTERIOR CERVICAL DECOMPRESSION/DISCECTOMY FUSION, ALLOGRAFT, PLATE  Past Medical History:  Diagnosis Date  . Anemia 11/24/2017  . Anxiety   . Bone spur    cervical spine  . Chronic kidney disease 04/2017   kidney infections  . Depression   . Deviated nasal septum    states is unable to lie flat, because her nose will become congested and she can stop breathing  . Diabetes mellitus without complication ([place])   . Dysrhythmia    heart flutters occasionally  . Fibromyalgia   . Fibromyalgia   . GERD (gastroesophageal reflux disease)   . Hallux abductovalgus with bunions 09/2014   right   . Hammertoe 09/2014   right 4th, 5th  . History of stomach ulcers   . Hyperlipidemia   . Hypertension    states is under control with med., has been on med. x 8 mos.  . IBS (irritable bowel syndrome)    no current med.  . Osteoarthritis    hands, bilateral hips  . Peripheral vascular disease (HCC)    occasional swelling in both legs  . Sinus headache     Surgeries: Procedure(s): C5-6 ANTERIOR CERVICAL DECOMPRESSION/DISCECTOMY FUSION, ALLOGRAFT, PLATE on 2/44/0102   Consultants:   Discharged Condition: Improved  Hospital Course: Besse Miron is an 50 y.o. female who was admitted 07/30/2020 for operative treatment of C5-6 HNP/stenosis. Patient failed conservative treatments (please see the history and physical for the specifics) and had severe unremitting pain that affects sleep, daily activities and work/hobbies. After pre-op clearance, the patient was taken to the operating room on 07/30/2020 and underwent  Procedure(s): C5-6 ANTERIOR CERVICAL DECOMPRESSION/DISCECTOMY FUSION, ALLOGRAFT,  PLATE.    Patient was given perioperative antibiotics:  Anti-infectives (From admission, onward)   Start     Dose/Rate Route Frequency Ordered Stop   07/30/20 1015  ceFAZolin (ANCEF) IVPB 2g/100 mL premix        2 g 200 mL/hr over 30 Minutes Intravenous On call to O.R. 07/30/20 1007 07/30/20 1310   07/30/20 1013  ceFAZolin (ANCEF) 2-4 GM/100ML-% IVPB       Note to Pharmacy: Gregery Na   : cabinet override      07/30/20 1013 07/30/20 1310       Patient was given sequential compression devices and early ambulation to prevent DVT.   Patient benefited maximally from hospital stay and there were no complications. At the time of discharge, the patient was urinating/moving their bowels without difficulty, tolerating a regular diet, pain is controlled with oral pain medications and they have been cleared by PT/OT.   Recent vital signs: No data found.   Recent laboratory studies: No results for input(s): WBC, HGB, HCT, PLT, NA, K, CL, CO2, BUN, CREATININE, GLUCOSE, INR, CALCIUM in the last 72 hours.  Invalid input(s): PT, 2   Discharge Medications:   Allergies as of 07/31/2020   No Known Allergies     Medication List    STOP taking these medications   diclofenac 50 MG EC tablet Commonly known as: VOLTAREN   predniSONE 5 MG tablet Commonly known as: DELTASONE   traMADol 50 MG tablet Commonly known as: ULTRAM     TAKE these medications   amLODipine 5 MG tablet Commonly known  as: NORVASC Take 1 tablet (5 mg total) by mouth daily.   dicyclomine 10 MG capsule Commonly known as: BENTYL TAKE 1 CAPSULE BY MOUTH 2 TIMES DAILY AS NEEDED What changed:   how much to take  how to take this  when to take this  additional instructions   diphenhydrAMINE 25 MG tablet Commonly known as: BENADRYL Take 25 mg by mouth daily.   escitalopram 10 MG tablet Commonly known as: LEXAPRO TAKE 2 TABLETS (20 MG TOTAL) BY MOUTH DAILY.   fluticasone 50 MCG/ACT nasal spray Commonly known  as: FLONASE PLACE 2 SPRAYS INTO BOTH NOSTRILS DAILY. What changed:   when to take this  reasons to take this   gabapentin 300 MG capsule Commonly known as: NEURONTIN TAKE 1 CAPSULE BY MOUTH 2 TIMES DAILY AND 2 CAPSULES AT BEDTIME. What changed:   how much to take  how to take this  when to take this   metFORMIN 500 MG tablet Commonly known as: GLUCOPHAGE TAKE 1 TABLET (500 MG TOTAL) BY MOUTH DAILY WITH BREAKFAST.   omeprazole 20 MG capsule Commonly known as: PRILOSEC TAKE 2 CAPSULES (40 MG TOTAL) BY MOUTH DAILY.   ondansetron 8 MG disintegrating tablet Commonly known as: ZOFRAN-ODT TAKE 1 TABLET (8 MG TOTAL) BY MOUTH DAILY AS NEEDED FOR NAUSEA OR VOMITING.   oxyCODONE-acetaminophen 5-325 MG tablet Commonly known as: PERCOCET/ROXICET Take 1 tablet by mouth every 4 (four) hours as needed for severe pain.   PAPAYA AND ENZYMES PO Take 1 tablet by mouth at bedtime as needed (IBS).   polyethylene glycol powder 17 GM/SCOOP powder Commonly known as: GLYCOLAX/MIRALAX TAKE AS DIRECTED BY PHYSICIAN AS 1 DOSE   QUEtiapine 25 MG tablet Commonly known as: SEROQUEL TAKE 1 TABLET (25 MG TOTAL) BY MOUTH AT BEDTIME.   rosuvastatin 20 MG tablet Commonly known as: CRESTOR TAKE 1 TABLET (20 MG TOTAL) BY MOUTH DAILY. TO LOWER CHOLESTEROL   tiZANidine 4 MG tablet Commonly known as: ZANAFLEX START WITH 1/2 TABLET BY MOUTH EVERY 8 HOURS. MAY CAUSE DROWSINEES. INCREASE TO 1 TABLET EVERY 8 HOURS AS NEEDED FOR MUSCLE IF TOLERABLE. What changed:   how much to take  how to take this  when to take this  reasons to take this       Diagnostic Studies: DG Cervical Spine 2-3 Views  Result Date: 07/30/2020 CLINICAL DATA:  ACDF C5-C6. EXAM: CERVICAL SPINE - 2-3 VIEW COMPARISON:  Preoperative MRI 06/04/2020 FINDINGS: Two fluoroscopic spot views of the cervical spine obtained in frontal and lateral projection. Fusion hardware at C5-C6. Total fluoroscopy time 18 seconds. Total dose 1.95  mGy. IMPRESSION: Intraoperative fluoroscopy during cervical spine surgery. Electronically Signed   By: Keith Rake M.D.   On: 07/30/2020 15:36   DG C-Arm 1-60 Min  Result Date: 07/30/2020 CLINICAL DATA:  ACDF C5-C6. EXAM: DG C-ARM 1-60 MIN FLUOROSCOPY TIME:  Fluoroscopy Time:  18 seconds Radiation Exposure Index (if provided by the fluoroscopic device): 1.95 mGy Number of Acquired Spot Images: 2 COMPARISON:  Preoperative MRI 06/04/2020 FINDINGS: Two fluoroscopic spot views of the cervical spine obtained in frontal and lateral projection. Anterior fusion hardware at C5-C6. IMPRESSION: Intraoperative fluoroscopy during cervical spine surgery. Electronically Signed   By: Keith Rake M.D.   On: 07/30/2020 15:43    Discharge Instructions    Incentive spirometry RT   Complete by: As directed        Follow-up Information    Schedule an appointment as soon as possible for a visit with  Marybelle Killings, MD.   Specialty: Orthopedic Surgery Why: need return office visit one week postop Contact information: [address] [place] [address] [phone]               Discharge Plan:  discharge to home  Disposition:     Signed: Benjiman Core  08/07/2020, 4:42 PM

## 2020-08-08 ENCOUNTER — Ambulatory Visit (INDEPENDENT_AMBULATORY_CARE_PROVIDER_SITE_OTHER): Payer: Self-pay | Admitting: Orthopaedic Surgery

## 2020-08-08 ENCOUNTER — Ambulatory Visit (INDEPENDENT_AMBULATORY_CARE_PROVIDER_SITE_OTHER): Payer: Self-pay

## 2020-08-08 VITALS — BP 90/65

## 2020-08-08 DIAGNOSIS — M5020 Other cervical disc displacement, unspecified cervical region: Secondary | ICD-10-CM

## 2020-08-08 NOTE — Progress Notes (Signed)
Post-Op Visit Note   Patient: Isabel Evans           Date of Birth: 1970-07-13           MRN: [id number] Visit Date: 08/08/2020 PCP: Charlott Rakes, MD   Assessment & Plan: Patient turns post single level C5-6  ACDF.  Incision looks good Steri-Strips changed.  Chief Complaint:  Chief Complaint  Patient presents with  . Neck - Routine Post Op   Visit Diagnoses:  1. Protrusion of cervical intervertebral disc     Plan: Continue soft collar.  Return in 5 weeks for lateral flexion-extension C-spine x-rays.  Follow-Up Instructions: No follow-ups on file.   Orders:  Orders Placed This Encounter  Procedures  . XR Cervical Spine 2 or 3 views   No orders of the defined types were placed in this encounter.   Imaging: No results found.  PMFS History: Patient Active Problem List   Diagnosis Date Noted  . Cervical spinal stenosis 07/30/2020  . Other spondylosis with radiculopathy, cervical region 06/18/2020  . Major depressive disorder, recurrent episode, moderate ([place]) 05/26/2020  . Protrusion of cervical intervertebral disc 03/22/2018  . Anemia 11/24/2017  . Pre-diabetes 11/24/2017  . Blurry vision, bilateral 05/18/2017  . Callus of foot 05/18/2017  . Pain in joint of left hip 02/16/2017  . Bruises easily 11/10/2016  . Gastroesophageal reflux disease without esophagitis 09/22/2016  . Fatigue associated with anemia 09/22/2016  . Flexion contractures 09/22/2016  . Generalized anxiety disorder 01/01/2016  . Dental abscess 01/01/2016  . Healthcare maintenance 05/08/2015  . Adjustment disorder with mixed anxiety and depressed mood 05/08/2015  . Stress at home 10/24/2014  . Irritable bowel syndrome 10/24/2014  . Anxiety state 10/24/2014  . Preop testing 09/19/2014  . Pap smear for cervical cancer screening 09/19/2014  . Midline low back pain without sciatica 08/12/2014  . [place] nodes 08/12/2014  . Contracture of joint, hand 08/12/2014  . Essential hypertension  02/11/2014  . Allergy 02/11/2014  . Screening for breast cancer 02/11/2014  . Dupuytren's contracture of both hands 11/15/2013  . Hallux valgus of right foot 11/15/2013  . Back pain 05/21/2013  . UTI (urinary tract infection) 05/21/2013  . Multiple skin nodules 11/27/2012  . Fibromyalgia 09/08/2012  . Osteoarthritis 09/08/2012  . Neck muscle spasm 07/14/2012  . Plantar wart 07/14/2012  . Insomnia 07/14/2012   Past Medical History:  Diagnosis Date  . Anemia 11/24/2017  . Anxiety   . Bone spur    cervical spine  . Chronic kidney disease 04/2017   kidney infections  . Depression   . Deviated nasal septum    states is unable to lie flat, because her nose will become congested and she can stop breathing  . Diabetes mellitus without complication ([place])   . Dysrhythmia    heart flutters occasionally  . Fibromyalgia   . Fibromyalgia   . GERD (gastroesophageal reflux disease)   . Hallux abductovalgus with bunions 09/2014   right   . Hammertoe 09/2014   right 4th, 5th  . History of stomach ulcers   . Hyperlipidemia   . Hypertension    states is under control with med., has been on med. x 8 mos.  . IBS (irritable bowel syndrome)    no current med.  . Osteoarthritis    hands, bilateral hips  . Peripheral vascular disease (HCC)    occasional swelling in both legs  . Sinus headache     Family History  Problem Relation Age  of Onset  . Hypertension Mother   . Diabetes Mother   . Heart disease Mother   . Hypertension Father   . Diabetes Father   . Prostate cancer Father   . Heart disease Father   . Alcohol abuse Father   . Hypertension Sister   . Diabetes Sister   . Heart disease Maternal Grandmother        Great GM  . Breast cancer Maternal Grandmother   . Bone cancer Maternal Grandmother   . Breast cancer Maternal Aunt   . Ovarian cancer Maternal Aunt   . Rheum arthritis Sister   . Colon cancer Other        great Aunt  . Rectal cancer Neg Hx   . Stomach cancer Neg Hx      Past Surgical History:  Procedure Laterality Date  . ABDOMINAL HYSTERECTOMY    . ANTERIOR CERVICAL DECOMP/DISCECTOMY FUSION N/A 07/30/2020   Procedure: C5-6 ANTERIOR CERVICAL DECOMPRESSION/DISCECTOMY FUSION, ALLOGRAFT, PLATE;  Surgeon: Marybelle Killings, MD;  Location: [facility];  Service: Orthopedics;  Laterality: N/A;  . BREAST BIOPSY Left 04/2018   benign  . BREAST BIOPSY Left 2016   benign  . BUNIONECTOMY Right 10/04/2014   Procedure:  [place], [place] Life;  Surgeon: Trula Slade, DPM;  Location: [facility];  Service: Podiatry;  Laterality: Right;  . COLONOSCOPY WITH PROPOFOL  02/13/2013  . CYSTOSCOPY N/A 07/07/2017   Procedure: CYSTOSCOPY;  Surgeon: Aletha Halim, MD;  Location: [facility];  Service: Gynecology;  Laterality: N/A;  . DUPUYTREN / PALMAR FASCIOTOMY Right 07/18/2013   exc. of multiple nodules  . 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 HAMMER TOE SURGERY Right 10/04/2014   Procedure: HAMMER TOE REPAIR 4TH  AND 5TH  RIGHT FOOT  fourth toe fixation with k wire;  Surgeon: Trula Slade, DPM;  Location: [facility];  Service: Podiatry;  Laterality: Right;  . 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 TUBAL LIGATION    . VAGINAL HYSTERECTOMY N/A 07/07/2017   Procedure: HYSTERECTOMY VAGINAL;  Surgeon: Aletha Halim, MD;  Location: [facility];  Service: Gynecology;  Laterality: N/A;   Social History   Occupational History  . Occupation: cook  Tobacco Use  . Smoking status: Former Smoker    Packs/day: 1.00    Years: 20.00    Pack years: 20.00    Types: Cigarettes    Quit date: 08/24/2012    Years since quitting: 7.9  . Smokeless tobacco: Never Used  Vaping Use  . Vaping Use: Some days  Substance and Sexual Activity  . Alcohol use: Yes    Comment: occasionally  . Drug use: Yes    Types: Marijuana  . Sexual activity: Yes    Birth control/protection: Surgical

## 2020-08-11 ENCOUNTER — Other Ambulatory Visit: Payer: Self-pay

## 2020-08-11 ENCOUNTER — Other Ambulatory Visit: Payer: Self-pay | Admitting: Family Medicine

## 2020-08-11 ENCOUNTER — Other Ambulatory Visit: Payer: Self-pay | Admitting: Physician Assistant

## 2020-08-11 DIAGNOSIS — M720 Palmar fascial fibromatosis [Dupuytren]: Secondary | ICD-10-CM

## 2020-08-11 DIAGNOSIS — M542 Cervicalgia: Secondary | ICD-10-CM

## 2020-08-11 DIAGNOSIS — G8929 Other chronic pain: Secondary | ICD-10-CM

## 2020-08-11 DIAGNOSIS — M545 Low back pain, unspecified: Secondary | ICD-10-CM

## 2020-08-11 DIAGNOSIS — M2550 Pain in unspecified joint: Secondary | ICD-10-CM

## 2020-08-11 MED FILL — Escitalopram Oxalate Tab 10 MG (Base Equiv): ORAL | 30 days supply | Qty: 60 | Fill #0 | Status: AC

## 2020-08-11 MED FILL — Quetiapine Fumarate Tab 25 MG: ORAL | 30 days supply | Qty: 30 | Fill #0 | Status: AC

## 2020-08-11 MED FILL — Gabapentin Cap 300 MG: ORAL | 30 days supply | Qty: 120 | Fill #0 | Status: AC

## 2020-08-11 MED FILL — Fluticasone Propionate Nasal Susp 50 MCG/ACT: NASAL | 30 days supply | Qty: 16 | Fill #0 | Status: AC

## 2020-08-11 MED FILL — Dicyclomine HCl Cap 10 MG: ORAL | 30 days supply | Qty: 60 | Fill #0 | Status: AC

## 2020-08-11 MED FILL — Rosuvastatin Calcium Tab 20 MG: ORAL | 30 days supply | Qty: 30 | Fill #0 | Status: AC

## 2020-08-11 NOTE — Telephone Encounter (Signed)
   Notes to clinic: medication was stopped on 07/31/2020 at discharged  Review for refill    Requested Prescriptions  Pending Prescriptions Disp Refills   traMADol (ULTRAM) 50 MG tablet 90 tablet 1    Sig: TAKE 1 TABLET (50 MG TOTAL) BY MOUTH 3 (THREE) TIMES DAILY AS NEEDED FOR PAIN.      Not Delegated - Analgesics:  Opioid Agonists Failed - 08/11/2020  2:24 PM      Failed - This refill cannot be delegated      Failed - Urine Drug Screen completed in last 360 days      Passed - Valid encounter within last 6 months    Recent Outpatient Visits           1 month ago Other spondylosis with radiculopathy, cervical region   [place], [place], MD   2 months ago Smoking   [place], [place]   4 months ago [place], [place]   5 months ago Neck pain   [place], [place]   7 months ago Abnormal weight loss   [place] Antony Blackbird, MD

## 2020-08-11 NOTE — Telephone Encounter (Signed)
   Notes to clinic:  Review for continued use and refill Looks like medication was stopped at discharge   Requested Prescriptions  Pending Prescriptions Disp Refills   diclofenac (VOLTAREN) 50 MG EC tablet 60 tablet 0    Sig: TAKE 1 TABLET (50 MG TOTAL) BY MOUTH 2 (TWO) TIMES DAILY. AFTER A MEAL AS NEEDED FOR PAIN      Analgesics:  NSAIDS Passed - 08/11/2020  2:24 PM      Passed - Cr in normal range and within 360 days    Creat  Date Value Ref Range Status  11/27/2015 0.75 0.50 - 1.10 mg/dL Final   Creatinine, Ser  Date Value Ref Range Status  07/30/2020 0.76 0.44 - 1.00 mg/dL Final          Passed - HGB in normal range and within 360 days    Hemoglobin  Date Value Ref Range Status  07/30/2020 14.2 12.0 - 15.0 g/dL Final  05/28/2020 11.8 11.1 - 15.9 g/dL Final          Passed - Patient is not pregnant      Passed - Valid encounter within last 12 months    Recent Outpatient Visits           1 month ago Other spondylosis with radiculopathy, cervical region   [place], Charlane Ferretti, MD   2 months ago Smoking   [place], [place], [place]   4 months ago [place], [place], [place]   5 months ago Neck pain   [place], [place], MD   7 months ago Abnormal weight loss   [place], [place], MD                  tiZANidine (ZANAFLEX) 4 MG tablet 90 tablet 0    Sig: START WITH 1/2 TABLET BY MOUTH EVERY 8 HOURS. MAY CAUSE DROWSINEES. INCREASE TO 1 TABLET EVERY 8 HOURS AS NEEDED FOR MUSCLE IF TOLERABLE.      Not Delegated - Cardiovascular:  Alpha-2 Agonists - tizanidine Failed - 08/11/2020  2:24 PM      Failed - This refill cannot be delegated      Passed - Valid encounter within last 6 months    Recent Outpatient Visits           1 month ago Other spondylosis with  radiculopathy, cervical region   [place], Enobong, MD   2 months ago Smoking   [place], [place]   4 months ago [place], [place]   5 months ago Neck pain   [place], MD   7 months ago Abnormal weight loss   [place] Antony Blackbird, MD

## 2020-08-12 ENCOUNTER — Other Ambulatory Visit: Payer: Self-pay

## 2020-08-13 ENCOUNTER — Other Ambulatory Visit: Payer: Self-pay

## 2020-08-13 MED ORDER — DICLOFENAC SODIUM 50 MG PO TBEC
DELAYED_RELEASE_TABLET | ORAL | 0 refills | Status: DC
  Filled 2020-08-13 – 2020-09-16 (×2): qty 60, 30d supply, fill #0

## 2020-08-13 MED ORDER — TIZANIDINE HCL 4 MG PO TABS
ORAL_TABLET | ORAL | 0 refills | Status: DC
  Filled 2020-08-13 – 2020-08-21 (×2): qty 90, 30d supply, fill #0

## 2020-08-14 ENCOUNTER — Other Ambulatory Visit: Payer: Self-pay

## 2020-08-18 ENCOUNTER — Other Ambulatory Visit: Payer: Self-pay | Admitting: Internal Medicine

## 2020-08-18 ENCOUNTER — Other Ambulatory Visit: Payer: Self-pay

## 2020-08-18 DIAGNOSIS — K219 Gastro-esophageal reflux disease without esophagitis: Secondary | ICD-10-CM

## 2020-08-21 ENCOUNTER — Other Ambulatory Visit: Payer: Self-pay

## 2020-08-22 ENCOUNTER — Other Ambulatory Visit: Payer: Self-pay | Admitting: Family Medicine

## 2020-08-22 ENCOUNTER — Other Ambulatory Visit: Payer: Self-pay

## 2020-08-22 DIAGNOSIS — K219 Gastro-esophageal reflux disease without esophagitis: Secondary | ICD-10-CM

## 2020-08-25 ENCOUNTER — Other Ambulatory Visit: Payer: Self-pay | Admitting: Obstetrics and Gynecology

## 2020-08-25 ENCOUNTER — Other Ambulatory Visit: Payer: Self-pay

## 2020-08-25 DIAGNOSIS — Z1231 Encounter for screening mammogram for malignant neoplasm of breast: Secondary | ICD-10-CM

## 2020-08-25 MED ORDER — OMEPRAZOLE 20 MG PO CPDR
40.0000 mg | DELAYED_RELEASE_CAPSULE | Freq: Every day | ORAL | 2 refills | Status: DC
Start: 2020-08-25 — End: 2020-11-16
  Filled 2020-08-25: qty 60, 30d supply, fill #0
  Filled 2020-09-16: qty 60, 30d supply, fill #1
  Filled 2020-10-20: qty 60, 30d supply, fill #2

## 2020-08-27 ENCOUNTER — Other Ambulatory Visit: Payer: Self-pay

## 2020-08-29 ENCOUNTER — Other Ambulatory Visit: Payer: Self-pay | Admitting: Surgery

## 2020-08-29 MED FILL — Metformin HCl Tab 500 MG: ORAL | 30 days supply | Qty: 30 | Fill #1 | Status: AC

## 2020-09-01 ENCOUNTER — Encounter (HOSPITAL_COMMUNITY): Payer: Self-pay | Admitting: Psychiatry

## 2020-09-01 ENCOUNTER — Other Ambulatory Visit (HOSPITAL_COMMUNITY): Payer: Self-pay

## 2020-09-01 ENCOUNTER — Other Ambulatory Visit: Payer: Self-pay

## 2020-09-01 ENCOUNTER — Other Ambulatory Visit: Payer: Self-pay | Admitting: Physician Assistant

## 2020-09-01 ENCOUNTER — Telehealth (INDEPENDENT_AMBULATORY_CARE_PROVIDER_SITE_OTHER): Payer: No Payment, Other | Admitting: Psychiatry

## 2020-09-01 DIAGNOSIS — F331 Major depressive disorder, recurrent, moderate: Secondary | ICD-10-CM

## 2020-09-01 DIAGNOSIS — F411 Generalized anxiety disorder: Secondary | ICD-10-CM | POA: Diagnosis not present

## 2020-09-01 DIAGNOSIS — K582 Mixed irritable bowel syndrome: Secondary | ICD-10-CM

## 2020-09-01 MED ORDER — DICYCLOMINE HCL 10 MG PO CAPS
ORAL_CAPSULE | ORAL | 2 refills | Status: DC
  Filled 2020-09-01: qty 60, fill #0
  Filled 2020-09-16: qty 60, 30d supply, fill #0
  Filled 2020-11-16 – 2020-11-24 (×2): qty 60, 30d supply, fill #1
  Filled 2020-12-23: qty 60, 30d supply, fill #2

## 2020-09-01 MED ORDER — QUETIAPINE FUMARATE 25 MG PO TABS
25.0000 mg | ORAL_TABLET | Freq: Every day | ORAL | 2 refills | Status: DC
  Filled 2020-09-01 – 2020-09-16 (×2): qty 30, 30d supply, fill #0
  Filled 2020-09-28: qty 30, 30d supply, fill #1
  Filled 2020-10-20: qty 30, 30d supply, fill #2

## 2020-09-01 MED ORDER — ESCITALOPRAM OXALATE 20 MG PO TABS
20.0000 mg | ORAL_TABLET | Freq: Every day | ORAL | 2 refills | Status: DC
  Filled 2020-09-01 – 2020-09-16 (×2): qty 30, 30d supply, fill #0
  Filled 2020-10-20: qty 30, 30d supply, fill #1

## 2020-09-01 MED FILL — Ondansetron Orally Disintegrating Tab 8 MG: ORAL | 20 days supply | Qty: 20 | Fill #0 | Status: CN

## 2020-09-01 NOTE — Progress Notes (Signed)
Psychiatric Initial Adult Assessment   Patient Identification: Isabel BrooklynBrenda Ann Venning MRN:  [id number] Date of Evaluation:  09/01/2020 Referral Source: PCP Chief Complaint:  Depression and anxiety Chief Complaint    Anxiety; Medication Refill; Follow-up; Depression     Visit Diagnosis:  Major depressive disorder, General anxiety disorder  History of Present Illness:   50 yo female with depression and anxiety, recent cervical surgery with relief from her numbness and tingling, less pain.  Continues to suffer with fibromyalgia and chronic DDD in her back.  Stress includes taking care of her mother with increase in dementia symptoms.  Her depression is higher at a 7/10 with ten being the highest, no suicidal ideations.  Hypersomnia and lack of motivation related to recent surgery and stressors.  Anxiety fluctuates with stress, no panic attacks.  The Seroquel is effective for her sleep, appetite is steady No homicidal ideations, hallucinations, or mania symptoms.  Denies side effects from her medications, follow-up in 1 month.  Associated Signs/Symptoms: Depression Symptoms:  depressed mood, anxiety, panic attacks, (Hypo) Manic Symptoms:  none Anxiety Symptoms:  Excessive Worry, Panic Symptoms, Psychotic Symptoms:  none PTSD Symptoms: NA  Past Psychiatric History: depression and anxiety  Previous Psychotropic Medications: Yes   Substance Abuse History in the last 12 months:  Yes.    Consequences of Substance Abuse: NA  Past Medical History:  Past Medical History:  Diagnosis Date  . Anemia 11/24/2017  . Anxiety   . Bone spur    cervical spine  . Chronic kidney disease 04/2017   kidney infections  . Depression   . Deviated nasal septum    states is unable to lie flat, because her nose will become congested and she can stop breathing  . Diabetes mellitus without complication (HCC)   . Dysrhythmia    heart flutters occasionally  . Fibromyalgia   . Fibromyalgia   . GERD  (gastroesophageal reflux disease)   . Hallux abductovalgus with bunions 09/2014   right   . Hammertoe 09/2014   right 4th, 5th  . History of stomach ulcers   . Hyperlipidemia   . Hypertension    states is under control with med., has been on med. x 8 mos.  . IBS (irritable bowel syndrome)    no current med.  . Osteoarthritis    hands, bilateral hips  . Peripheral vascular disease (HCC)    occasional swelling in both legs  . Sinus headache     Past Surgical History:  Procedure Laterality Date  . ABDOMINAL HYSTERECTOMY    . ANTERIOR CERVICAL DECOMP/DISCECTOMY FUSION N/A 07/30/2020   Procedure: C5-6 ANTERIOR CERVICAL DECOMPRESSION/DISCECTOMY FUSION, ALLOGRAFT, PLATE;  Surgeon: Eldred MangesYates, Mark C, MD;  Location: [facility];  Service: Orthopedics;  Laterality: N/A;  . BREAST BIOPSY Left 04/2018   benign  . BREAST BIOPSY Left 2016   benign  . BUNIONECTOMY Right 10/04/2014   Procedure:  Serafina RoyalsAUSTIN BUNIONECTOMY, Donzetta SprungAILOR'S BUNIONECTOMY;  Surgeon: Vivi BarrackMatthew R Wagoner, DPM;  Location: [facility];  Service: Podiatry;  Laterality: Right;  . COLONOSCOPY WITH PROPOFOL  02/13/2013  . CYSTOSCOPY N/A 07/07/2017   Procedure: CYSTOSCOPY;  Surgeon: Mogul BingPickens, Charlie, MD;  Location: [facility];  Service: Gynecology;  Laterality: N/A;  . DUPUYTREN / PALMAR FASCIOTOMY Right 07/18/2013   exc. of multiple nodules  . 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 HAMMER TOE SURGERY Right 10/04/2014   Procedure: HAMMER TOE REPAIR 4TH  AND 5TH  RIGHT FOOT  fourth toe fixation with k  wire;  Surgeon: Trula Slade, DPM;  Location: [facility];  Service: Podiatry;  Laterality: Right;  . 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 TUBAL LIGATION    . VAGINAL HYSTERECTOMY N/A 07/07/2017   Procedure: HYSTERECTOMY VAGINAL;  Surgeon: Aletha Halim, MD;  Location: [facility];  Service: Gynecology;  Laterality: N/A;    Family Psychiatric History: see below, mother and sister with  depression and anxiety  Family History:  Family History  Problem Relation Age of Onset  . Hypertension Mother   . Diabetes Mother   . Heart disease Mother   . Hypertension Father   . Diabetes Father   . Prostate cancer Father   . Heart disease Father   . Alcohol abuse Father   . Hypertension Sister   . Diabetes Sister   . Heart disease Maternal Grandmother        Great GM  . Breast cancer Maternal Grandmother   . Bone cancer Maternal Grandmother   . Breast cancer Maternal Aunt   . Ovarian cancer Maternal Aunt   . Rheum arthritis Sister   . Colon cancer Other        great Aunt  . Rectal cancer Neg Hx   . Stomach cancer Neg Hx     Social History:   Social History   Socioeconomic History  . Marital status: Single    Spouse name: Not on file  . Number of children: 3  . Years of education: Not on file  . Highest education level: 7th grade  Occupational History  . Occupation: cook  Tobacco Use  . Smoking status: Former Smoker    Packs/day: 1.00    Years: 20.00    Pack years: 20.00    Types: Cigarettes    Quit date: 08/24/2012    Years since quitting: 8.0  . Smokeless tobacco: Never Used  Vaping Use  . Vaping Use: Some days  Substance and Sexual Activity  . Alcohol use: Yes    Comment: occasionally  . Drug use: Yes    Types: Marijuana  . Sexual activity: Yes    Birth control/protection: Surgical  Other Topics Concern  . Not on file  Social History Narrative  . Not on file   Social Determinants of Health   Financial Resource Strain: Not on file  Food Insecurity: Not on file  Transportation Needs: Not on file  Physical Activity: Not on file  Stress: Not on file  Social Connections: Not on file    Additional Social History: lives with supportive partner, works part-time  Allergies:  No Known Allergies  Metabolic Disorder Labs: Lab Results  Component Value Date   HGBA1C 5.9 (H) 05/28/2020   No results found for: PROLACTIN Lab Results  Component  Value Date   CHOL 147 08/24/2019   TRIG 87 08/24/2019   HDL 69 08/24/2019   CHOLHDL 2.1 08/24/2019   [place] 62 08/24/2019   [place] 60 07/06/2019   Lab Results  Component Value Date   TSH 0.606 01/04/2020    Therapeutic Level Labs: No results found for: LITHIUM No results found for: CBMZ No results found for: VALPROATE  Current Medications: Current Outpatient Medications  Medication Sig Dispense Refill  . amLODipine (NORVASC) 5 MG tablet Take 1 tablet (5 mg total) by mouth daily. 30 tablet 2  . diclofenac (VOLTAREN) 50 MG EC tablet TAKE 1 TABLET (50 MG TOTAL) BY MOUTH 2 (TWO) TIMES DAILY. AFTER A MEAL AS  NEEDED FOR PAIN 60 tablet 0  . dicyclomine (BENTYL) 10 MG capsule TAKE 1 CAPSULE BY MOUTH 2 TIMES DAILY AS NEEDED (Patient taking differently: Take 10 mg by mouth daily. 1 bid prn) 60 capsule 2  . Digestive Enzymes (PAPAYA AND ENZYMES PO) Take 1 tablet by mouth at bedtime as needed (IBS).    Marland Kitchen diphenhydrAMINE (BENADRYL) 25 MG tablet Take 25 mg by mouth daily.    Marland Kitchen escitalopram (LEXAPRO) 10 MG tablet TAKE 2 TABLETS (20 MG TOTAL) BY MOUTH DAILY. 180 tablet 0  . fluticasone (FLONASE) 50 MCG/ACT nasal spray PLACE 2 SPRAYS INTO BOTH NOSTRILS DAILY. (Patient taking differently: Place 2 sprays into both nostrils daily as needed.) 16 g 11  . gabapentin (NEURONTIN) 300 MG capsule TAKE 1 CAPSULE BY MOUTH 2 TIMES DAILY AND 2 CAPSULES AT BEDTIME. (Patient taking differently: Take 600 mg by mouth 2 (two) times daily.) 120 capsule 2  . metFORMIN (GLUCOPHAGE) 500 MG tablet TAKE 1 TABLET (500 MG TOTAL) BY MOUTH DAILY WITH BREAKFAST. 90 tablet 0  . omeprazole (PRILOSEC) 20 MG capsule TAKE 2 CAPSULES (40 MG TOTAL) BY MOUTH DAILY. 60 capsule 2  . ondansetron (ZOFRAN-ODT) 8 MG disintegrating tablet TAKE 1 TABLET (8 MG TOTAL) BY MOUTH DAILY AS NEEDED FOR NAUSEA OR VOMITING. 20 tablet 2  . oxyCODONE-acetaminophen (PERCOCET/ROXICET) 5-325 MG tablet Take 1 tablet by mouth every 4 (four) hours as needed for  severe pain. 50 tablet 0  . polyethylene glycol powder (GLYCOLAX/MIRALAX) 17 GM/SCOOP powder TAKE AS DIRECTED BY PHYSICIAN AS 1 DOSE 476 g 0  . QUEtiapine (SEROQUEL) 25 MG tablet TAKE 1 TABLET (25 MG TOTAL) BY MOUTH AT BEDTIME. 30 tablet 2  . rosuvastatin (CRESTOR) 20 MG tablet TAKE 1 TABLET (20 MG TOTAL) BY MOUTH DAILY. TO LOWER CHOLESTEROL 30 tablet 2  . tiZANidine (ZANAFLEX) 4 MG tablet START WITH 1/2 TABLET BY MOUTH EVERY 8 HOURS. MAY CAUSE DROWSINEES. INCREASE TO 1 TABLET EVERY 8 HOURS AS NEEDED FOR MUSCLE IF TOLERABLE. 90 tablet 0   No current facility-administered medications for this visit.    Musculoskeletal: Strength & Muscle Tone: within normal limits, per patient report Gait & Station: did not witness, telephone visit Patient leans: denies  Psychiatric Specialty Exam: Review of Systems  Psychiatric/Behavioral: Positive for dysphoric mood. The patient is nervous/anxious.   All other systems reviewed and are negative.   Last menstrual period 07/04/2017.There is no height or weight on file to calculate BMI.  General Appearance: UTA, telephone visit  Eye Contact:  UTA  Speech:  Normal Rate  Volume:  Normal  Mood:  Anxious and Depressed  Affect:  UTA  Thought Process:  Coherent and Descriptions of Associations: Intact  Orientation:  Full (Time, Place, and Person)  Thought Content:  WDL and Logical  Suicidal Thoughts:  No  Homicidal Thoughts:  No  Memory:  Immediate;   Good Recent;   Good Remote;   Good  Judgement:  Good  Insight:  Good  Psychomotor Activity:  Normal  Concentration:  Concentration: Good and Attention Span: Good  Recall:  Good  Fund of Knowledge:Good  Language: Good  Akathisia:  No  Handed:  Right  AIMS (if indicated):  not needed  Assets:  Housing Intimacy Leisure Time Resilience Social Support  ADL's:  Intact  Cognition: WNL  Sleep:  Fair   Screenings: GAD-7   Personnel officer Visit from 06/19/2020 in [place] Video Visit from 05/05/2020 in [facility]  Office Visit from 01/04/2020 in [facility] Office Visit from 07/06/2019 in [place] Office Visit from 09/06/2018 in [place]  Total GAD-7 Score 14 20 16 17 20     PHQ2-9   [place] Office Visit from 06/19/2020 in [place] Video Visit from 06/09/2020 in [place] from 03/24/2020 in [facility] Office Visit from 02/28/2020 in [facility] Office Visit from 01/04/2020 in [facility]  PHQ-2 Total Score 4 0 1 5 4   PHQ-9 Total Score 15 -- 4 16 15     Flowsheet Row Admission (Discharged) from 07/30/2020 in [place] Video Visit from 06/09/2020 in [facility] from 12/26/2017 in [place] No Risk No Risk Low Risk      Assessment and Plan:  General anxiety disorder and panic disorder: -Continue Klonopin 0.5 mg daily PRN per PCP if warranted for panic attacks -Continue gabapentin 300 mg TID for anxiety and neuropathic pain  Major Depressive Disorder, recurrent, moderate -Continue Lexapro 20 mg daily -Establish and start therapy at [facility] or another facility -Reevaluate in 1 month  Chronic back Pain: -Continue care with MD  Insomnia: -Continue Seroquel 25 mg at bedtime PRN sleep  Virtual Visit via Video Note  I connected with [name] on 09/01/20 at  9:00 AM EDT by a video enabled telemedicine application and verified that I am speaking with the correct person using two identifiers.  Location: Patient: home Provider: home office     I discussed the limitations of evaluation and management by telemedicine and  the availability of in person appointments. The patient expressed understanding and agreed to proceed.  Follow Up Instructions: Follow up in 2 months   I discussed the assessment and treatment plan with the patient. The patient was provided an opportunity to ask questions and all were answered. The patient agreed with the plan and demonstrated an understanding of the instructions.   The patient was advised to call back or seek an in-person evaluation if the symptoms worsen or if the condition fails to improve as anticipated.  I provided 25 minutes of non-face-to-face time during this encounter.   Waylan Boga, NP  Waylan Boga, NP 5/16/20229:03 AM

## 2020-09-02 ENCOUNTER — Other Ambulatory Visit: Payer: Self-pay

## 2020-09-02 ENCOUNTER — Telehealth: Payer: Self-pay

## 2020-09-02 MED ORDER — OXYCODONE-ACETAMINOPHEN 5-325 MG PO TABS
1.0000 | ORAL_TABLET | Freq: Three times a day (TID) | ORAL | 0 refills | Status: DC | PRN
  Filled 2020-09-02: qty 30, 10d supply, fill #0

## 2020-09-02 NOTE — Telephone Encounter (Signed)
[facility] called stating that they do not fill Rx's for narcotics.  Please send Rx to another pharmacy.  Thank you.

## 2020-09-03 ENCOUNTER — Other Ambulatory Visit (HOSPITAL_BASED_OUTPATIENT_CLINIC_OR_DEPARTMENT_OTHER): Payer: Self-pay

## 2020-09-09 ENCOUNTER — Other Ambulatory Visit: Payer: Self-pay

## 2020-09-11 ENCOUNTER — Ambulatory Visit: Payer: Self-pay

## 2020-09-11 ENCOUNTER — Encounter: Payer: Self-pay | Admitting: Surgery

## 2020-09-11 ENCOUNTER — Ambulatory Visit (INDEPENDENT_AMBULATORY_CARE_PROVIDER_SITE_OTHER): Payer: Self-pay | Admitting: Surgery

## 2020-09-11 DIAGNOSIS — M5020 Other cervical disc displacement, unspecified cervical region: Secondary | ICD-10-CM

## 2020-09-11 NOTE — Progress Notes (Addendum)
50 year old white female who is 6-week status post C5-6 ACDF returns.  States that she continues to have pain in the posterior neck that radiates into the bilateral trapezius and scapular area.  No radiation down her arms.  Patient states that she is currently taking gabapentin and oral NSAID.  She has about another prescription for Norco.  Patient does admit to smoking marijuana.  Exam Very pleasant white female alert and oriented in no acute stress.  She has moderate bilateral trapezius and scapular tenderness.  Neurologically intact.  Plan Today patient's x-rays look good.  Dr. Lorin Mercy not in the clinic today.  I will hold off on having patient come out of her soft collar.  Follow-up with him in 1 week for recheck but I will have patient call the clinic Tuesday morning and Dr. Lorin Mercy will look at the x-rays to see if she can come out of the collar at that time.  I advised patient that with her admission of marijuana use that I did not feel comfortable prescribing Norco.  She voices understanding.

## 2020-09-16 ENCOUNTER — Other Ambulatory Visit: Payer: Self-pay

## 2020-09-16 ENCOUNTER — Encounter: Payer: Self-pay | Admitting: Orthopaedic Surgery

## 2020-09-16 ENCOUNTER — Other Ambulatory Visit: Payer: Self-pay | Admitting: Family Medicine

## 2020-09-16 ENCOUNTER — Ambulatory Visit (INDEPENDENT_AMBULATORY_CARE_PROVIDER_SITE_OTHER): Payer: Self-pay | Admitting: Orthopaedic Surgery

## 2020-09-16 ENCOUNTER — Other Ambulatory Visit: Payer: Self-pay | Admitting: Physician Assistant

## 2020-09-16 DIAGNOSIS — E782 Mixed hyperlipidemia: Secondary | ICD-10-CM

## 2020-09-16 DIAGNOSIS — M545 Low back pain, unspecified: Secondary | ICD-10-CM

## 2020-09-16 DIAGNOSIS — M542 Cervicalgia: Secondary | ICD-10-CM

## 2020-09-16 DIAGNOSIS — Z981 Arthrodesis status: Secondary | ICD-10-CM

## 2020-09-16 MED ORDER — ROSUVASTATIN CALCIUM 20 MG PO TABS
ORAL_TABLET | ORAL | 0 refills | Status: DC
  Filled 2020-09-16: qty 30, 30d supply, fill #0

## 2020-09-16 MED FILL — Gabapentin Cap 300 MG: ORAL | 30 days supply | Qty: 120 | Fill #1 | Status: AC

## 2020-09-16 MED FILL — Fluticasone Propionate Nasal Susp 50 MCG/ACT: NASAL | 30 days supply | Qty: 16 | Fill #1 | Status: AC

## 2020-09-16 MED FILL — Ondansetron Orally Disintegrating Tab 8 MG: ORAL | 20 days supply | Qty: 20 | Fill #0 | Status: AC

## 2020-09-16 NOTE — Telephone Encounter (Signed)
Requested medication (s) are due for refill today: yes   Requested medication (s) are on the active medication list: yes   Last refill:  08/21/2020  Future visit scheduled:no  Notes to clinic:  this refill cannot be delegated    Requested Prescriptions  Pending Prescriptions Disp Refills   tiZANidine (ZANAFLEX) 4 MG tablet 90 tablet 0    Sig: START WITH 1/2 TABLET BY MOUTH EVERY 8 HOURS. MAY CAUSE DROWSINEES. INCREASE TO 1 TABLET EVERY 8 HOURS AS NEEDED FOR MUSCLE IF TOLERABLE.      Not Delegated - Cardiovascular:  Alpha-2 Agonists - tizanidine Failed - 09/16/2020 11:21 AM      Failed - This refill cannot be delegated      Passed - Valid encounter within last 6 months    Recent Outpatient Visits           2 months ago Other spondylosis with radiculopathy, cervical region   [place], [place], MD   3 months ago Smoking   [place], [place]   5 months ago [place], [place]   6 months ago Neck pain   [place], [place]   8 months ago Abnormal weight loss   [place], [place], MD       Future Appointments             Today Marybelle Killings, MD [facility]

## 2020-09-16 NOTE — Progress Notes (Signed)
Post-Op Visit Note   Patient: Isabel Evans           Date of Birth: 06/08/1970           MRN: [id number] Visit Date: 09/16/2020 PCP: Charlott Rakes, MD   Assessment & Plan: Post cervical fusion.  Incision looks good.  Discontinue collar.  She has had problems with Dupuytren's which have been released and then had recurrent severe enough that hand surgeons have told her no surgery would be recommended.  She has some problems with nodules on her feet that is bothered her in the past.  X-rays 09/11/2020 showed good position of graft and plate and screws.  She can resume work on 09/23/2020 work slip given.  She states she is looking at possibly looking for a job is somewhat easier.  No restrictions on work resumption.  Chief Complaint:  Chief Complaint  Patient presents with  . Neck - Follow-up    07/30/2020 C5-6 ACDF   Visit Diagnoses:  1. S/P cervical spinal fusion     Plan: Return to work note given for 09/23/2020.  Follow-up here in 1 month single lateral x-ray on return.  Follow-Up Instructions: No follow-ups on file.   Orders:  No orders of the defined types were placed in this encounter.  No orders of the defined types were placed in this encounter.   Imaging: No results found.  PMFS History: Patient Active Problem List   Diagnosis Date Noted  . S/P cervical spinal fusion 09/16/2020  . Cervical spinal stenosis 07/30/2020  . Other spondylosis with radiculopathy, cervical region 06/18/2020  . Major depressive disorder, recurrent episode, moderate ([place]) 05/26/2020  . Protrusion of cervical intervertebral disc 03/22/2018  . Anemia 11/24/2017  . Pre-diabetes 11/24/2017  . Blurry vision, bilateral 05/18/2017  . Callus of foot 05/18/2017  . Pain in joint of left hip 02/16/2017  . Bruises easily 11/10/2016  . Gastroesophageal reflux disease without esophagitis 09/22/2016  . Fatigue associated with anemia 09/22/2016  . Flexion contractures 09/22/2016  . Generalized  anxiety disorder 01/01/2016  . Dental abscess 01/01/2016  . Healthcare maintenance 05/08/2015  . Adjustment disorder with mixed anxiety and depressed mood 05/08/2015  . Stress at home 10/24/2014  . Irritable bowel syndrome 10/24/2014  . Anxiety state 10/24/2014  . Preop testing 09/19/2014  . Pap smear for cervical cancer screening 09/19/2014  . Midline low back pain without sciatica 08/12/2014  . [place] nodes 08/12/2014  . Contracture of joint, hand 08/12/2014  . Essential hypertension 02/11/2014  . Allergy 02/11/2014  . Screening for breast cancer 02/11/2014  . Dupuytren's contracture of both hands 11/15/2013  . Hallux valgus of right foot 11/15/2013  . Back pain 05/21/2013  . UTI (urinary tract infection) 05/21/2013  . Multiple skin nodules 11/27/2012  . Fibromyalgia 09/08/2012  . Osteoarthritis 09/08/2012  . Neck muscle spasm 07/14/2012  . Plantar wart 07/14/2012  . Insomnia 07/14/2012   Past Medical History:  Diagnosis Date  . Anemia 11/24/2017  . Anxiety   . Bone spur    cervical spine  . Chronic kidney disease 04/2017   kidney infections  . Depression   . Deviated nasal septum    states is unable to lie flat, because her nose will become congested and she can stop breathing  . Diabetes mellitus without complication ([place])   . Dysrhythmia    heart flutters occasionally  . Fibromyalgia   . Fibromyalgia   . GERD (gastroesophageal reflux disease)   . Hallux abductovalgus with  bunions 09/2014   right   . Hammertoe 09/2014   right 4th, 5th  . History of stomach ulcers   . Hyperlipidemia   . Hypertension    states is under control with med., has been on med. x 8 mos.  . IBS (irritable bowel syndrome)    no current med.  . Osteoarthritis    hands, bilateral hips  . Peripheral vascular disease (HCC)    occasional swelling in both legs  . Sinus headache     Family History  Problem Relation Age of Onset  . Hypertension Mother   . Diabetes Mother   . Heart  disease Mother   . Hypertension Father   . Diabetes Father   . Prostate cancer Father   . Heart disease Father   . Alcohol abuse Father   . Hypertension Sister   . Diabetes Sister   . Heart disease Maternal Grandmother        Great GM  . Breast cancer Maternal Grandmother   . Bone cancer Maternal Grandmother   . Breast cancer Maternal Aunt   . Ovarian cancer Maternal Aunt   . Rheum arthritis Sister   . Colon cancer Other        great Aunt  . Rectal cancer Neg Hx   . Stomach cancer Neg Hx     Past Surgical History:  Procedure Laterality Date  . ABDOMINAL HYSTERECTOMY    . ANTERIOR CERVICAL DECOMP/DISCECTOMY FUSION N/A 07/30/2020   Procedure: C5-6 ANTERIOR CERVICAL DECOMPRESSION/DISCECTOMY FUSION, ALLOGRAFT, PLATE;  Surgeon: Marybelle Killings, MD;  Location: [facility];  Service: Orthopedics;  Laterality: N/A;  . BREAST BIOPSY Left 04/2018   benign  . BREAST BIOPSY Left 2016   benign  . BUNIONECTOMY Right 10/04/2014   Procedure:  Altamese Venango, Emmaline Life;  Surgeon: Trula Slade, DPM;  Location: [facility];  Service: Podiatry;  Laterality: Right;  . COLONOSCOPY WITH PROPOFOL  02/13/2013  . CYSTOSCOPY N/A 07/07/2017   Procedure: CYSTOSCOPY;  Surgeon: Aletha Halim, MD;  Location: [facility];  Service: Gynecology;  Laterality: N/A;  . DUPUYTREN / PALMAR FASCIOTOMY Right 07/18/2013   exc. of multiple nodules  . 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 HAMMER TOE SURGERY Right 10/04/2014   Procedure: HAMMER TOE REPAIR 4TH  AND 5TH  RIGHT FOOT  fourth toe fixation with k wire;  Surgeon: Trula Slade, DPM;  Location: [facility];  Service: Podiatry;  Laterality: Right;  . 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 TUBAL LIGATION    . VAGINAL HYSTERECTOMY N/A 07/07/2017   Procedure: HYSTERECTOMY VAGINAL;  Surgeon: Aletha Halim, MD;  Location: [facility];  Service: Gynecology;  Laterality: N/A;   Social  History   Occupational History  . Occupation: cook  Tobacco Use  . Smoking status: Former Smoker    Packs/day: 1.00    Years: 20.00    Pack years: 20.00    Types: Cigarettes    Quit date: 08/24/2012    Years since quitting: 8.0  . Smokeless tobacco: Never Used  Vaping Use  . Vaping Use: Some days  Substance and Sexual Activity  . Alcohol use: Yes    Comment: occasionally  . Drug use: Yes    Types: Marijuana  . Sexual activity: Yes    Birth control/protection: Surgical

## 2020-09-17 ENCOUNTER — Other Ambulatory Visit: Payer: Self-pay

## 2020-09-18 ENCOUNTER — Telehealth: Payer: Self-pay | Admitting: Orthopaedic Surgery

## 2020-09-18 ENCOUNTER — Other Ambulatory Visit (HOSPITAL_COMMUNITY): Payer: Self-pay

## 2020-09-18 ENCOUNTER — Ambulatory Visit: Payer: Self-pay

## 2020-09-18 ENCOUNTER — Other Ambulatory Visit: Payer: Self-pay

## 2020-09-18 MED ORDER — TIZANIDINE HCL 4 MG PO TABS
4.0000 mg | ORAL_TABLET | Freq: Two times a day (BID) | ORAL | 0 refills | Status: DC | PRN
  Filled 2020-09-18: qty 40, 20d supply, fill #0

## 2020-09-18 NOTE — Telephone Encounter (Signed)
Ok to send in Zanaflex 4mg   #40  one po bid prn spasm thanks

## 2020-09-18 NOTE — Telephone Encounter (Signed)
I called patient to advise. Medication was sent to [facility]. Voicemail picks up on first ring and does not state who it belongs to. No message left.

## 2020-09-18 NOTE — Telephone Encounter (Signed)
Please advise 

## 2020-09-18 NOTE — Telephone Encounter (Signed)
Patient called requesting a muscle relaxer for back pains. Please send to pharmacy on file. Please call patient about this matter at [phone].

## 2020-09-19 NOTE — Telephone Encounter (Signed)
I called patient and advised. 

## 2020-09-22 ENCOUNTER — Other Ambulatory Visit: Payer: Self-pay

## 2020-09-25 ENCOUNTER — Telehealth: Payer: Self-pay | Admitting: Orthopaedic Surgery

## 2020-09-25 ENCOUNTER — Encounter: Payer: Self-pay | Admitting: Orthopaedic Surgery

## 2020-09-25 ENCOUNTER — Ambulatory Visit (INDEPENDENT_AMBULATORY_CARE_PROVIDER_SITE_OTHER): Payer: Self-pay | Admitting: Orthopaedic Surgery

## 2020-09-25 DIAGNOSIS — M65331 Trigger finger, right middle finger: Secondary | ICD-10-CM

## 2020-09-25 MED ORDER — LIDOCAINE HCL 1 % IJ SOLN
1.0000 mL | INTRAMUSCULAR | Status: AC | PRN
Start: 2020-09-25 — End: 2020-09-25
  Administered 2020-09-25: 1 mL

## 2020-09-25 MED ORDER — METHYLPREDNISOLONE ACETATE 40 MG/ML IJ SUSP
13.3300 mg | INTRAMUSCULAR | Status: AC | PRN
  Administered 2020-09-25: 13.33 mg

## 2020-09-25 MED ORDER — BUPIVACAINE HCL 0.25 % IJ SOLN
0.3300 mL | INTRAMUSCULAR | Status: AC | PRN
  Administered 2020-09-25: .33 mL

## 2020-09-25 NOTE — Telephone Encounter (Signed)
Pt had an appt this morning with Dr. Lorin Mercy and forgot to ask if could he call in a medication or prescription to help with the pain in her neck. The best pharmacy is the one on file and the best call back number is [phone].

## 2020-09-25 NOTE — Telephone Encounter (Signed)
Patient saw Dr. Erlinda Hong this morning. Dr. Lorin Mercy is out of the office. Can he advise?

## 2020-09-25 NOTE — Progress Notes (Signed)
Office Visit Note   Patient: Isabel Evans           Date of Birth: 1970/09/06           MRN: [id number] Visit Date: 09/25/2020              Requested by: Charlott Rakes, MD [place],  [place] [id number] PCP: Charlott Rakes, MD   Assessment & Plan: Visit Diagnoses:  1. Trigger finger, right middle finger     Plan: Impression is recurrent right long trigger finger.  We have discussed repeat cortisone injection for which she would like to proceed.  We have also discussed nighttime splinting for 1 week which she will also try.  She is aware that this condition is more common in diabetics and that this could recur.  If her symptoms do recur, she will follow-up with [place] to likely discuss surgical intervention.  Call with concerns or questions in the meantime.  Follow-Up Instructions: Return if symptoms worsen or fail to improve.   Orders:  Orders Placed This Encounter  Procedures   Hand/UE Inj: R long A1   No orders of the defined types were placed in this encounter.     Procedures: Hand/UE Inj: R long A1 for trigger finger on 09/25/2020 8:59 AM Indications: pain Details: 25 G needle Medications: 1 mL lidocaine 1 %; 0.33 mL bupivacaine 0.25 %; 13.33 mg methylPREDNISolone acetate 40 MG/ML     Clinical Data: No additional findings.   Subjective: Chief Complaint  Patient presents with   Right Middle Finger - Pain    HPI patient is a pleasant 50 year old female who comes in today with recurrent pain and triggering to the right long finger.  She does have an underlying history of trigger finger which was injected in November of last year.  This helped tremendously but only lasted for about 3 months.  The pain and triggering has progressively worsened.  She is requesting a repeat cortisone injection if possible.  Of note, she is a type II diabetic.  Review of Systems as detailed in HPI.  All others reviewed and are negative.   Objective: Vital Signs:  LMP 07/04/2017   Physical Exam well-developed and well-nourished female in no acute distress.  Alert oriented x3.  Ortho Exam right long finger exam shows moderate tenderness and a palpable nodule at the A1 pulley.  She does have reproducible triggering.  She is neurovascular intact distally.  Specialty Comments:  No specialty comments available.  Imaging: No new imaging   PMFS History: Patient Active Problem List   Diagnosis Date Noted   S/P cervical spinal fusion 09/16/2020   Cervical spinal stenosis 07/30/2020   Other spondylosis with radiculopathy, cervical region 06/18/2020   Major depressive disorder, recurrent episode, moderate (HCC) 05/26/2020   Protrusion of cervical intervertebral disc 03/22/2018   Anemia 11/24/2017   Pre-diabetes 11/24/2017   Blurry vision, bilateral 05/18/2017   Callus of foot 05/18/2017   Pain in joint of left hip 02/16/2017   Bruises easily 11/10/2016   Gastroesophageal reflux disease without esophagitis 09/22/2016   Fatigue associated with anemia 09/22/2016   Flexion contractures 09/22/2016   Generalized anxiety disorder 01/01/2016   Dental abscess 01/01/2016   Healthcare maintenance 05/08/2015   Adjustment disorder with mixed anxiety and depressed mood 05/08/2015   Stress at home 10/24/2014   Irritable bowel syndrome 10/24/2014   Anxiety state 10/24/2014   Preop testing 09/19/2014   Pap smear for cervical cancer screening 09/19/2014  Midline low back pain without sciatica 08/12/2014   Heberden nodes 08/12/2014   Contracture of joint, hand 08/12/2014   Essential hypertension 02/11/2014   Allergy 02/11/2014   Screening for breast cancer 02/11/2014   Dupuytren's contracture of both hands 11/15/2013   Hallux valgus of right foot 11/15/2013   Back pain 05/21/2013   UTI (urinary tract infection) 05/21/2013   Multiple skin nodules 11/27/2012   Fibromyalgia 09/08/2012   Osteoarthritis 09/08/2012   Neck muscle spasm 07/14/2012   Plantar  wart 07/14/2012   Insomnia 07/14/2012   Past Medical History:  Diagnosis Date   Anemia 11/24/2017   Anxiety    Bone spur    cervical spine   Chronic kidney disease 04/2017   kidney infections   Depression    Deviated nasal septum    states is unable to lie flat, because her nose will become congested and she can stop breathing   Diabetes mellitus without complication (HCC)    Dysrhythmia    heart flutters occasionally   Fibromyalgia    Fibromyalgia    GERD (gastroesophageal reflux disease)    Hallux abductovalgus with bunions 09/2014   right    Hammertoe 09/2014   right 4th, 5th   History of stomach ulcers    Hyperlipidemia    Hypertension    states is under control with med., has been on med. x 8 mos.   IBS (irritable bowel syndrome)    no current med.   Osteoarthritis    hands, bilateral hips   Peripheral vascular disease (HCC)    occasional swelling in both legs   Sinus headache     Family History  Problem Relation Age of Onset   Hypertension Mother    Diabetes Mother    Heart disease Mother    Hypertension Father    Diabetes Father    Prostate cancer Father    Heart disease Father    Alcohol abuse Father    Hypertension Sister    Diabetes Sister    Heart disease Maternal Grandmother        Great GM   Breast cancer Maternal Grandmother    Bone cancer Maternal Grandmother    Breast cancer Maternal Aunt    Ovarian cancer Maternal Aunt    Rheum arthritis Sister    Colon cancer Other        great Aunt   Rectal cancer Neg Hx    Stomach cancer Neg Hx     Past Surgical History:  Procedure Laterality Date   ABDOMINAL HYSTERECTOMY     ANTERIOR CERVICAL DECOMP/DISCECTOMY FUSION N/A 07/30/2020   Procedure: C5-6 ANTERIOR CERVICAL DECOMPRESSION/DISCECTOMY FUSION, ALLOGRAFT, PLATE;  Surgeon: Marybelle Killings, MD;  Location: [facility];  Service: Orthopedics;  Laterality: N/A;   BREAST BIOPSY Left 04/2018   benign   BREAST BIOPSY Left 2016   benign   BUNIONECTOMY Right  10/04/2014   Procedure:  Altamese [place], Emmaline Life;  Surgeon: Trula Slade, DPM;  Location: [facility];  Service: Podiatry;  Laterality: Right;   COLONOSCOPY WITH PROPOFOL  02/13/2013   CYSTOSCOPY N/A 07/07/2017   Procedure: CYSTOSCOPY;  Surgeon: Aletha Halim, MD;  Location: [facility];  Service: Gynecology;  Laterality: N/A;   DUPUYTREN / PALMAR FASCIOTOMY Right 07/18/2013   exc. 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   small finger   HAMMER TOE SURGERY Right 10/04/2014   Procedure: HAMMER TOE REPAIR 4TH  AND 5TH  RIGHT FOOT  fourth toe  fixation with k wire;  Surgeon: Trula Slade, DPM;  Location: [facility];  Service: Podiatry;  Laterality: Right;   LEG SURGERY Left    as a child; near amputation of leg   TUBAL LIGATION     VAGINAL HYSTERECTOMY N/A 07/07/2017   Procedure: HYSTERECTOMY VAGINAL;  Surgeon: Aletha Halim, MD;  Location: [facility];  Service: Gynecology;  Laterality: N/A;   Social History   Occupational History   Occupation: cook  Tobacco Use   Smoking status: Former    Packs/day: 1.00    Years: 20.00    Pack years: 20.00    Types: Cigarettes    Quit date: 08/24/2012    Years since quitting: 8.0   Smokeless tobacco: Never  Vaping Use   Vaping Use: Some days  Substance and Sexual Activity   Alcohol use: Yes    Comment: occasionally   Drug use: Yes    Types: Marijuana   Sexual activity: Yes    Birth control/protection: Surgical

## 2020-09-26 ENCOUNTER — Other Ambulatory Visit (HOSPITAL_COMMUNITY): Payer: Self-pay

## 2020-09-26 MED ORDER — TRAMADOL HCL 50 MG PO TABS
50.0000 mg | ORAL_TABLET | Freq: Three times a day (TID) | ORAL | 0 refills | Status: DC | PRN
  Filled 2020-09-26: qty 20, 7d supply, fill #0

## 2020-09-26 NOTE — Telephone Encounter (Signed)
Can you send in tramadol 50mg  1 tid prn pain#20

## 2020-09-26 NOTE — Telephone Encounter (Signed)
Called to pharmacy. I called patient and advised. 

## 2020-09-29 ENCOUNTER — Other Ambulatory Visit: Payer: Self-pay

## 2020-10-03 ENCOUNTER — Other Ambulatory Visit: Payer: Self-pay

## 2020-10-03 ENCOUNTER — Ambulatory Visit (INDEPENDENT_AMBULATORY_CARE_PROVIDER_SITE_OTHER): Payer: Self-pay | Admitting: Orthopaedic Surgery

## 2020-10-03 ENCOUNTER — Ambulatory Visit (INDEPENDENT_AMBULATORY_CARE_PROVIDER_SITE_OTHER): Payer: Self-pay

## 2020-10-03 ENCOUNTER — Ambulatory Visit: Payer: Self-pay | Admitting: Orthopaedic Surgery

## 2020-10-03 VITALS — BP 120/85 | HR 71 | Ht 71.0 in | Wt 156.0 lb

## 2020-10-03 DIAGNOSIS — M542 Cervicalgia: Secondary | ICD-10-CM

## 2020-10-03 DIAGNOSIS — Z981 Arthrodesis status: Secondary | ICD-10-CM

## 2020-10-03 NOTE — Progress Notes (Signed)
Office Visit Note   Patient: Isabel Evans           Date of Birth: Feb 28, 1971           MRN: [id number] Visit Date: 10/03/2020              Requested by: Charlott Rakes, MD [place],  [place] [id number] PCP: Charlott Rakes, MD   Assessment & Plan: Visit Diagnoses:  1. Neck pain   2. S/P cervical spinal fusion     Plan: Patient could try her collar intermittently to see if it gives her some improvement.  She has been on 3 tramadol a day for greater than a year.  Still on Seroquel.  Zanaflex gave her some relief.  We reviewed x-rays it appears that her fusion is progressing nicely.  She has problems with anxiety and depression.  She can try some Aspercreme on her neck and I will recheck her again in 3 months with lateral flexion-extension C-spine x-ray.  Follow-Up Instructions: Return in about 3 months (around 01/03/2021).   Orders:  Orders Placed This Encounter  Procedures   XR Cervical Spine 2 or 3 views   No orders of the defined types were placed in this encounter.     Procedures: No procedures performed   Clinical Data: No additional findings.   Subjective: Chief Complaint  Patient presents with   Neck - Pain    07/30/2020 C5-6 ACDF    HPI 50 year old female returns post single level cervical fusion C5-6 2 months ago.  States she has some pins-and-needles type sensation across her posterior shoulder and the posterior neck.  She has gone back to work and states that it continues to bother her.  She did use some tramadol off and on.  Review of Systems updated unchanged.   Objective: Vital Signs: BP 120/85   Pulse 71   Ht 5\' 11"  (1.803 m)   Wt 156 lb (70.8 kg)   LMP 07/04/2017   BMI 21.76 kg/m   Physical Exam Constitutional:      Appearance: She is well-developed.  HENT:     Head: Normocephalic.     Right Ear: External ear normal.     Left Ear: External ear normal. There is no impacted cerumen.  Eyes:     Pupils: Pupils are  equal, round, and reactive to light.  Neck:     Thyroid: No thyromegaly.     Trachea: No tracheal deviation.  Cardiovascular:     Rate and Rhythm: Normal rate.  Pulmonary:     Effort: Pulmonary effort is normal.  Abdominal:     Palpations: Abdomen is soft.  Musculoskeletal:     Cervical back: No rigidity.  Skin:    General: Skin is warm and dry.  Neurological:     Mental Status: She is alert and oriented to person, place, and time.  Psychiatric:        Behavior: Behavior normal.    Ortho Exam patient with recurrent Dupuytren's without skin breakdown.  She has discomfort palpation of the trapezial muscles medial border the scapula no winging of the scapula.  Upper extremity reflexes are 2+ and symmetrical no impingement of the shoulder.  No lower extremity clonus normal heel toe gait.  Specialty Comments:  No specialty comments available.  Imaging: No results found.   PMFS History: Patient Active Problem List   Diagnosis Date Noted   S/P cervical spinal fusion 09/16/2020   Cervical spinal stenosis 07/30/2020  Other spondylosis with radiculopathy, cervical region 06/18/2020   Major depressive disorder, recurrent episode, moderate ([facility]) 05/26/2020   Protrusion of cervical intervertebral disc 03/22/2018   Anemia 11/24/2017   Pre-diabetes 11/24/2017   Blurry vision, bilateral 05/18/2017   Callus of foot 05/18/2017   Pain in joint of left hip 02/16/2017   Bruises easily 11/10/2016   Gastroesophageal reflux disease without esophagitis 09/22/2016   Fatigue associated with anemia 09/22/2016   Flexion contractures 09/22/2016   Generalized anxiety disorder 01/01/2016   Dental abscess 01/01/2016   Healthcare maintenance 05/08/2015   Adjustment disorder with mixed anxiety and depressed mood 05/08/2015   Stress at home 10/24/2014   Irritable bowel syndrome 10/24/2014   Anxiety state 10/24/2014   Preop testing 09/19/2014   Pap smear for cervical cancer screening 09/19/2014    Midline low back pain without sciatica 08/12/2014   Heberden nodes 08/12/2014   Contracture of joint, hand 08/12/2014   Essential hypertension 02/11/2014   Allergy 02/11/2014   Screening for breast cancer 02/11/2014   Dupuytren's contracture of both hands 11/15/2013   Hallux valgus of right foot 11/15/2013   Back pain 05/21/2013   UTI (urinary tract infection) 05/21/2013   Multiple skin nodules 11/27/2012   Fibromyalgia 09/08/2012   Osteoarthritis 09/08/2012   Neck muscle spasm 07/14/2012   Plantar wart 07/14/2012   Insomnia 07/14/2012   Past Medical History:  Diagnosis Date   Anemia 11/24/2017   Anxiety    Bone spur    cervical spine   Chronic kidney disease 04/2017   kidney infections   Depression    Deviated nasal septum    states is unable to lie flat, because her nose will become congested and she can stop breathing   Diabetes mellitus without complication (HCC)    Dysrhythmia    heart flutters occasionally   Fibromyalgia    Fibromyalgia    GERD (gastroesophageal reflux disease)    Hallux abductovalgus with bunions 09/2014   right    Hammertoe 09/2014   right 4th, 5th   History of stomach ulcers    Hyperlipidemia    Hypertension    states is under control with med., has been on med. x 8 mos.   IBS (irritable bowel syndrome)    no current med.   Osteoarthritis    hands, bilateral hips   Peripheral vascular disease (HCC)    occasional swelling in both legs   Sinus headache     Family History  Problem Relation Age of Onset   Hypertension Mother    Diabetes Mother    Heart disease Mother    Hypertension Father    Diabetes Father    Prostate cancer Father    Heart disease Father    Alcohol abuse Father    Hypertension Sister    Diabetes Sister    Heart disease Maternal Grandmother        Great GM   Breast cancer Maternal Grandmother    Bone cancer Maternal Grandmother    Breast cancer Maternal Aunt    Ovarian cancer Maternal Aunt    Rheum arthritis  Sister    Colon cancer Other        great Aunt   Rectal cancer Neg Hx    Stomach cancer Neg Hx     Past Surgical History:  Procedure Laterality Date   ABDOMINAL HYSTERECTOMY     ANTERIOR CERVICAL DECOMP/DISCECTOMY FUSION N/A 07/30/2020   Procedure: C5-6 ANTERIOR CERVICAL DECOMPRESSION/DISCECTOMY FUSION, ALLOGRAFT, PLATE;  Surgeon: Marybelle Killings,  MD;  Location: [facility];  Service: Orthopedics;  Laterality: N/A;   BREAST BIOPSY Left 04/2018   benign   BREAST BIOPSY Left 2016   benign   BUNIONECTOMY Right 10/04/2014   Procedure:  Altamese [place], Emmaline Life;  Surgeon: Trula Slade, DPM;  Location: [facility];  Service: Podiatry;  Laterality: Right;   COLONOSCOPY WITH PROPOFOL  02/13/2013   CYSTOSCOPY N/A 07/07/2017   Procedure: CYSTOSCOPY;  Surgeon: Aletha Halim, MD;  Location: [facility];  Service: Gynecology;  Laterality: N/A;   DUPUYTREN / PALMAR FASCIOTOMY Right 07/18/2013   exc. 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   small finger   HAMMER TOE SURGERY Right 10/04/2014   Procedure: HAMMER TOE REPAIR 4TH  AND 5TH  RIGHT FOOT  fourth toe fixation with k wire;  Surgeon: Trula Slade, DPM;  Location: [facility];  Service: Podiatry;  Laterality: Right;   LEG SURGERY Left    as a child; near amputation of leg   TUBAL LIGATION     VAGINAL HYSTERECTOMY N/A 07/07/2017   Procedure: HYSTERECTOMY VAGINAL;  Surgeon: Aletha Halim, MD;  Location: [facility];  Service: Gynecology;  Laterality: N/A;   Social History   Occupational History   Occupation: cook  Tobacco Use   Smoking status: Former    Packs/day: 1.00    Years: 20.00    Pack years: 20.00    Types: Cigarettes    Quit date: 08/24/2012    Years since quitting: 8.1   Smokeless tobacco: Never  Vaping Use   Vaping Use: Some days  Substance and Sexual Activity   Alcohol use: Yes    Comment: occasionally   Drug use: Yes    Types: Marijuana   Sexual  activity: Yes    Birth control/protection: Surgical

## 2020-10-06 ENCOUNTER — Other Ambulatory Visit: Payer: Self-pay

## 2020-10-06 ENCOUNTER — Other Ambulatory Visit: Payer: Self-pay | Admitting: Family Medicine

## 2020-10-06 DIAGNOSIS — R7303 Prediabetes: Secondary | ICD-10-CM

## 2020-10-06 MED ORDER — METFORMIN HCL 500 MG PO TABS
ORAL_TABLET | Freq: Every day | ORAL | 0 refills | Status: DC
  Filled 2020-10-06 – 2020-10-13 (×2): qty 30, 30d supply, fill #0
  Filled 2020-11-16 – 2020-12-11 (×2): qty 30, 30d supply, fill #1
  Filled 2021-01-16: qty 30, 30d supply, fill #2

## 2020-10-13 ENCOUNTER — Other Ambulatory Visit: Payer: Self-pay

## 2020-10-13 ENCOUNTER — Ambulatory Visit: Payer: Self-pay | Attending: Nurse Practitioner | Admitting: Nurse Practitioner

## 2020-10-13 ENCOUNTER — Other Ambulatory Visit (HOSPITAL_COMMUNITY)
Admission: RE | Admit: 2020-10-13 | Discharge: 2020-10-13 | Disposition: A | Discharge status: Home / Self Care | Payer: Self-pay | Source: Ambulatory Visit | Attending: Nurse Practitioner | Admitting: Nurse Practitioner

## 2020-10-13 ENCOUNTER — Encounter: Payer: Self-pay | Admitting: Nurse Practitioner

## 2020-10-13 DIAGNOSIS — R399 Unspecified symptoms and signs involving the genitourinary system: Secondary | ICD-10-CM

## 2020-10-13 MED ORDER — SULFAMETHOXAZOLE-TRIMETHOPRIM 800-160 MG PO TABS
1.0000 | ORAL_TABLET | Freq: Two times a day (BID) | ORAL | 0 refills | Status: AC
  Filled 2020-10-13: qty 6, 3d supply, fill #0

## 2020-10-13 NOTE — Progress Notes (Signed)
Virtual Visit via Telephone Note Due to national recommendations of social distancing due to [place] 19, telehealth visit is felt to be most appropriate for this patient at this time.  I discussed the limitations, risks, security and privacy concerns of performing an evaluation and management service by telephone and the availability of in person appointments. I also discussed with the patient that there may be a patient responsible charge related to this service. The patient expressed understanding and agreed to proceed.    I connected with [name] on 10/13/20  at  10:50 AM EDT  EDT by telephone and verified that I am speaking with the correct person using two identifiers.  Location of Patient: Private Residence   Location of Provider: [place] [facility] and [employer] Office    Persons participating in Telemedicine visit: Geryl Rankins FNP-BC [name]    History of Present Illness: Telemedicine visit for: UTI SYMPTOMS  Urinary Tract Infection: Patient complains of abnormal smelling urine, burning with urination, bilateral flank pain, frequency, incomplete bladder emptying, incontinence, suprapubic pressure, and urgency She has had symptoms for 1 week. Patient also complains of back pain. Patient denies fever. Patient does have a history of recurrent UTI.  Patient does not have a history of pyelonephritis.     Past Medical History:  Diagnosis Date   Anemia 11/24/2017   Anxiety    Bone spur    cervical spine   Chronic kidney disease 04/2017   kidney infections   Depression    Deviated nasal septum    states is unable to lie flat, because her nose will become congested and she can stop breathing   Diabetes mellitus without complication (HCC)    Dysrhythmia    heart flutters occasionally   Fibromyalgia    Fibromyalgia    GERD (gastroesophageal reflux disease)    Hallux abductovalgus with bunions 09/2014   right    Hammertoe 09/2014   right 4th, 5th    History of stomach ulcers    Hyperlipidemia    Hypertension    states is under control with med., has been on med. x 8 mos.   IBS (irritable bowel syndrome)    no current med.   Osteoarthritis    hands, bilateral hips   Peripheral vascular disease (HCC)    occasional swelling in both legs   Sinus headache     Past Surgical History:  Procedure Laterality Date   ABDOMINAL HYSTERECTOMY     ANTERIOR CERVICAL DECOMP/DISCECTOMY FUSION N/A 07/30/2020   Procedure: C5-6 ANTERIOR CERVICAL DECOMPRESSION/DISCECTOMY FUSION, ALLOGRAFT, PLATE;  Surgeon: Marybelle Killings, MD;  Location: [facility];  Service: Orthopedics;  Laterality: N/A;   BREAST BIOPSY Left 04/2018   benign   BREAST BIOPSY Left 2016   benign   BUNIONECTOMY Right 10/04/2014   Procedure:  Altamese [place], Emmaline Life;  Surgeon: Trula Slade, DPM;  Location: [facility];  Service: Podiatry;  Laterality: Right;   COLONOSCOPY WITH PROPOFOL  02/13/2013   CYSTOSCOPY N/A 07/07/2017   Procedure: CYSTOSCOPY;  Surgeon: Aletha Halim, MD;  Location: [facility];  Service: Gynecology;  Laterality: N/A;   DUPUYTREN / PALMAR FASCIOTOMY Right 07/18/2013   exc. 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   small finger   HAMMER TOE SURGERY Right 10/04/2014   Procedure: HAMMER TOE REPAIR 4TH  AND 5TH  RIGHT FOOT  fourth toe fixation with k wire;  Surgeon: Trula Slade, DPM;  Location: [facility];  Service: Podiatry;  Laterality: Right;   LEG SURGERY Left    as a child; near amputation of leg   TUBAL LIGATION     VAGINAL HYSTERECTOMY N/A 07/07/2017   Procedure: HYSTERECTOMY VAGINAL;  Surgeon: Aletha Halim, MD;  Location: [facility];  Service: Gynecology;  Laterality: N/A;    Family History  Problem Relation Age of Onset   Hypertension Mother    Diabetes Mother    Heart disease Mother    Hypertension Father    Diabetes Father    Prostate cancer Father    Heart disease Father     Alcohol abuse Father    Hypertension Sister    Diabetes Sister    Heart disease Maternal Grandmother        Great GM   Breast cancer Maternal Grandmother    Bone cancer Maternal Grandmother    Breast cancer Maternal Aunt    Ovarian cancer Maternal Aunt    Rheum arthritis Sister    Colon cancer Other        great Aunt   Rectal cancer Neg Hx    Stomach cancer Neg Hx     Social History   Socioeconomic History   Marital status: Single    Spouse name: Not on file   Number of children: 3   Years of education: Not on file   Highest education level: 7th grade  Occupational History   Occupation: Training and development officer  Tobacco Use   Smoking status: Former    Packs/day: 1.00    Years: 20.00    Pack years: 20.00    Types: Cigarettes    Quit date: 08/24/2012    Years since quitting: 8.1   Smokeless tobacco: Never  Vaping Use   Vaping Use: Some days  Substance and Sexual Activity   Alcohol use: Yes    Comment: occasionally   Drug use: Yes    Types: Marijuana   Sexual activity: Yes    Birth control/protection: Surgical  Other Topics Concern   Not on file  Social History Narrative   Not on file   Social Determinants of Health   Financial Resource Strain: Not on file  Food Insecurity: Not on file  Transportation Needs: Not on file  Physical Activity: Not on file  Stress: Not on file  Social Connections: Not on file     Observations/Objective: Awake, alert and oriented x 3   Review of Systems  Constitutional:  Negative for fever, malaise/fatigue and weight loss.  Respiratory: Negative.  Negative for cough and shortness of breath.   Cardiovascular: Negative.  Negative for chest pain, palpitations and leg swelling.  Gastrointestinal: Negative.  Negative for heartburn, nausea and vomiting.  Genitourinary:  Positive for dysuria, frequency and urgency. Negative for flank pain and hematuria.       SEE HPI  Musculoskeletal:  Positive for back pain and neck pain. Negative for myalgias.   Neurological: Negative.  Negative for dizziness, focal weakness, seizures and headaches.   Assessment and Plan: Diagnoses and all orders for this visit:  UTI symptoms -     Urinalysis, Complete; Future -     sulfamethoxazole-trimethoprim (BACTRIM DS) 800-160 MG tablet; Take 1 tablet by mouth 2 (two) times daily for 3 days. -     Cervicovaginal ancillary only; Future    Follow Up Instructions Return if symptoms worsen or fail to improve.     I discussed the assessment and treatment plan with the patient. The patient was provided an opportunity to ask questions and  all were answered. The patient agreed with the plan and demonstrated an understanding of the instructions.   The patient was advised to call back or seek an in-person evaluation if the symptoms worsen or if the condition fails to improve as anticipated.  I provided 8 minutes of non-face-to-face time during this encounter including median intraservice time, reviewing previous notes, labs, imaging, medications and explaining diagnosis and management.  Gildardo Pounds, FNP-BC

## 2020-10-14 LAB — CERVICOVAGINAL ANCILLARY ONLY
Bacterial Vaginitis (gardnerella): NEGATIVE
Candida Glabrata: NEGATIVE
Candida Vaginitis: NEGATIVE
Chlamydia: NEGATIVE
Comment: NEGATIVE
Comment: NEGATIVE
Comment: NEGATIVE
Comment: NEGATIVE
Comment: NEGATIVE
Comment: NORMAL
Neisseria Gonorrhea: NEGATIVE
Trichomonas: NEGATIVE

## 2020-10-14 LAB — URINALYSIS, COMPLETE
Bilirubin, UA: NEGATIVE
Glucose, UA: NEGATIVE
Ketones, UA: NEGATIVE
Leukocytes,UA: NEGATIVE
Nitrite, UA: NEGATIVE
Protein,UA: NEGATIVE
RBC, UA: NEGATIVE
Specific Gravity, UA: 1.009 (ref 1.005–1.030)
Urobilinogen, Ur: 0.2 mg/dL (ref 0.2–1.0)
pH, UA: 6 (ref 5.0–7.5)

## 2020-10-14 LAB — MICROSCOPIC EXAMINATION
Bacteria, UA: NONE SEEN
Casts: NONE SEEN /lpf
Epithelial Cells (non renal): NONE SEEN /hpf (ref 0–10)
WBC, UA: NONE SEEN /hpf (ref 0–5)

## 2020-10-15 ENCOUNTER — Ambulatory Visit: Payer: Self-pay | Admitting: Orthopaedic Surgery

## 2020-10-16 IMAGING — MR MR LUMBAR SPINE W/O CM
5 series · 47 of 48 positions shown · non-contrast
Comparison: Lumbar radiographs 02/13/2018. No prior MRI for
comparison.

CLINICAL DATA: Lumbar radiculopathy.

EXAM:
MRI LUMBAR SPINE WITHOUT CONTRAST
TECHNIQUE: Multiplanar, multisequence MR imaging of the lumbar spine was
performed. No intravenous contrast was administered.

[Series 3: T2 post-contrast · sagittal · 4.0mm · 0.88mm/px · 6 of 12 slices shown]
[im 1/12]
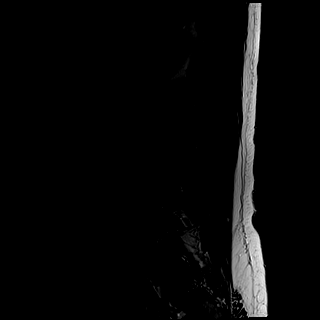
[im 3/12]
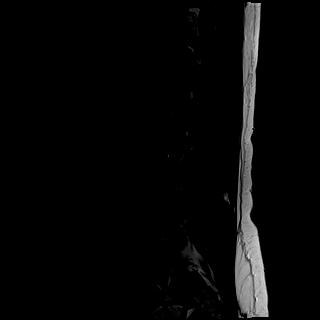
[im 5/12]
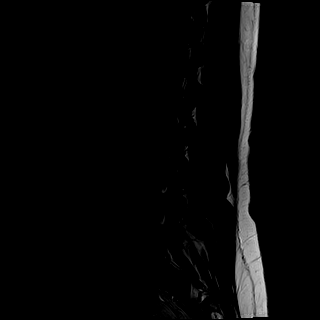
[im 7/12]
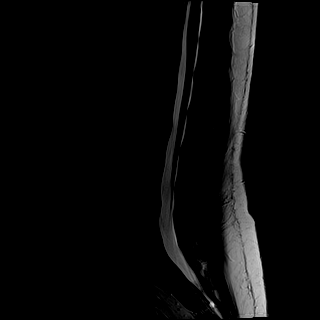
[im 9/12]
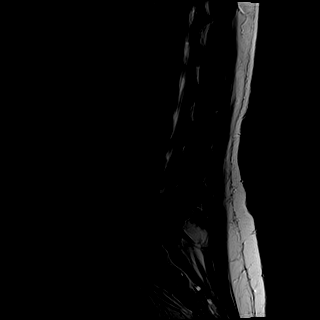
[im 12/12]
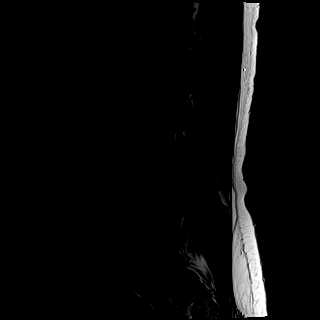

[Series 4: T1 · sagittal · 4.0mm · 0.88mm/px · 5 of 12 slices shown (1 of 2)]
[im 1/12]
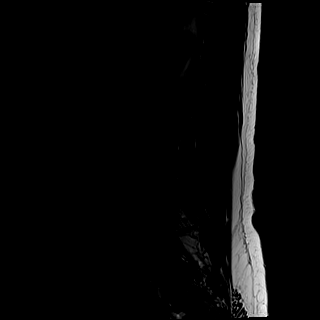
[im 3/12]
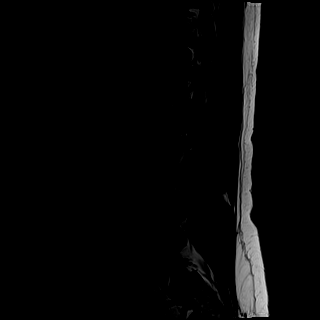
[im 6/12]
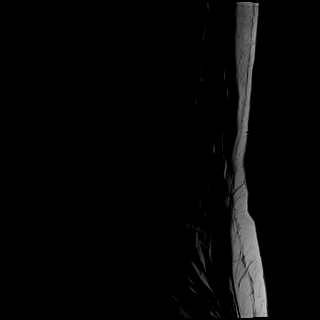
[im 9/12]
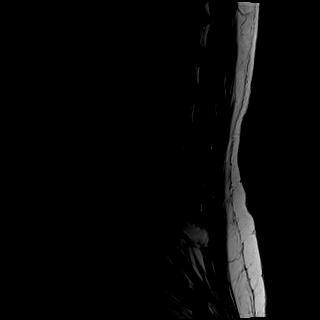
[im 12/12]
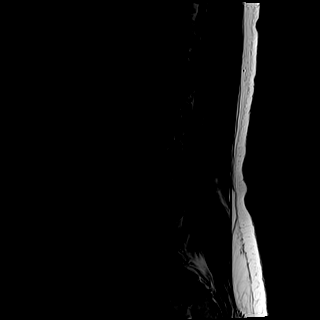

[Series 5: tirm sag · sagittal · 4.0mm · 0.55mm/px · 5 of 12 slices shown]
[im 1/12]
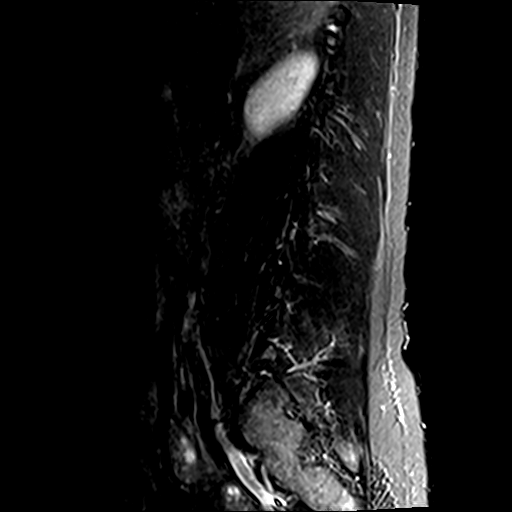
[im 3/12]
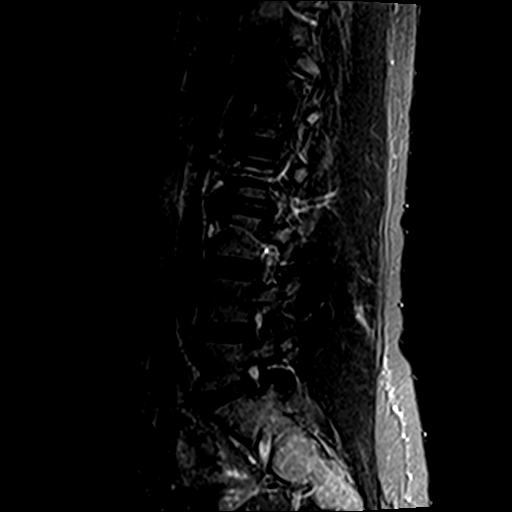
[im 6/12]
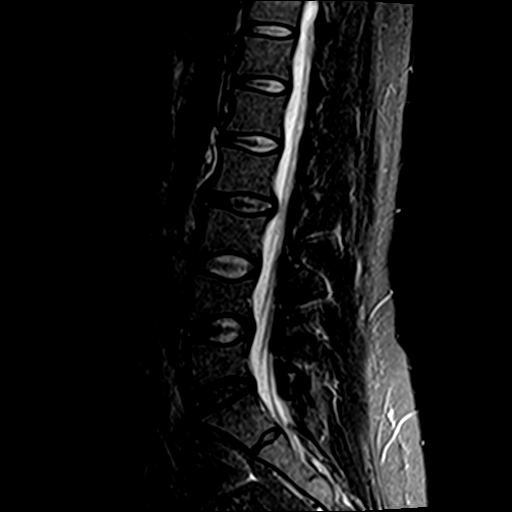
[im 9/12]
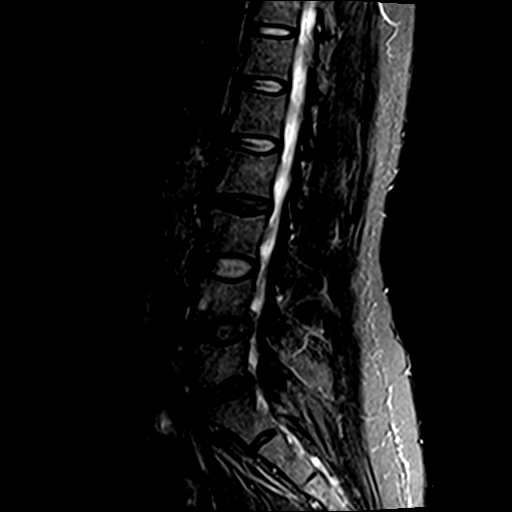
[im 12/12]
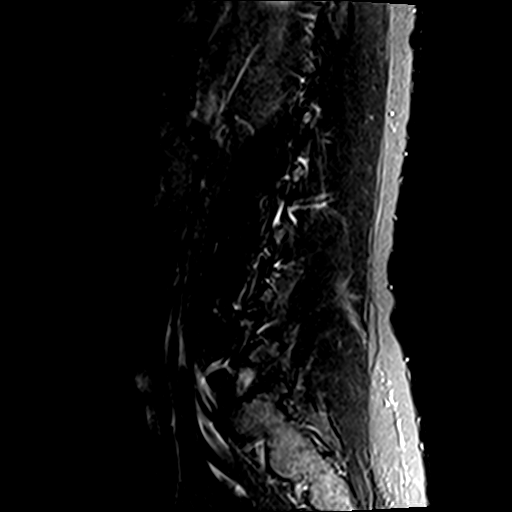

[Series 6: T1 · axial · 4.0mm · 0.78mm/px · z∈[-91,+123]mm · 15 of 38 slices shown (2 of 2)]
[im 1/38]
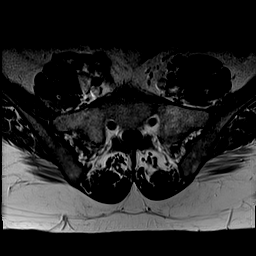
[im 3/38]
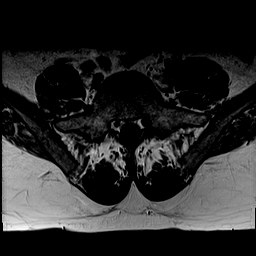
[im 5/38]
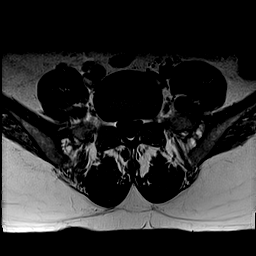
[im 8/38]
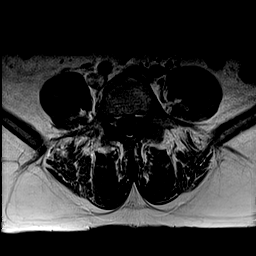
[im 10/38]
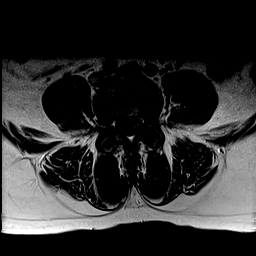
[im 13/38]
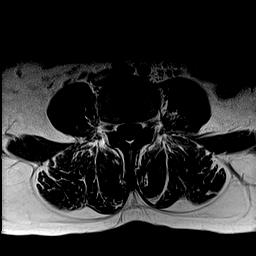
[im 15/38]
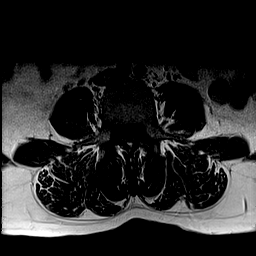
[im 18/38]
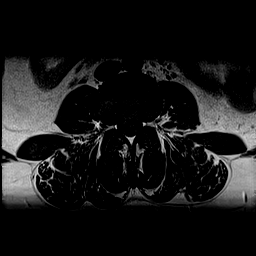
[im 20/38]
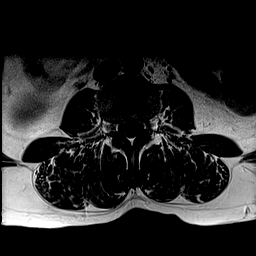
[im 23/38]
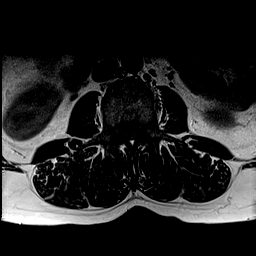
[im 25/38]
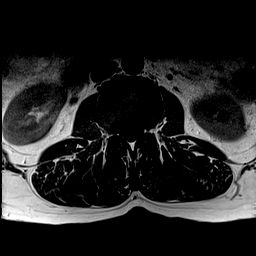
[im 28/38]
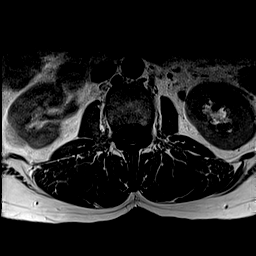
[im 30/38]
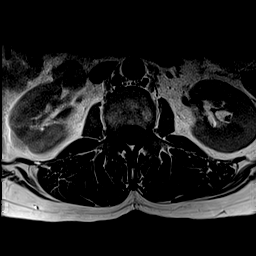
[im 33/38]
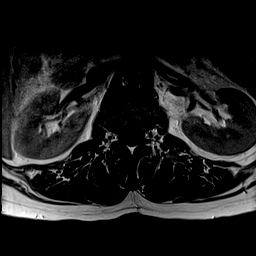
[im 38/38]
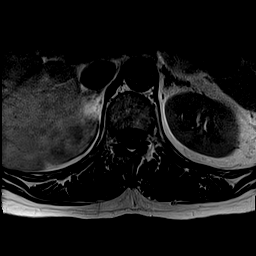

[Series 7: T2 · axial · 4.0mm · 0.78mm/px · z∈[-91,+123]mm · 16 of 38 slices shown]
[im 1/38]
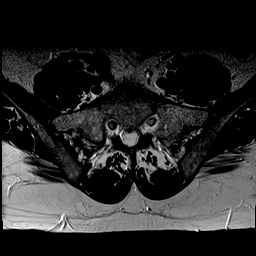
[im 3/38]
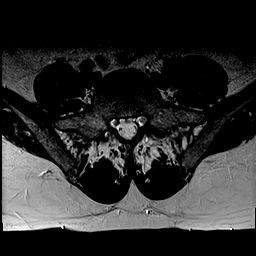
[im 5/38]
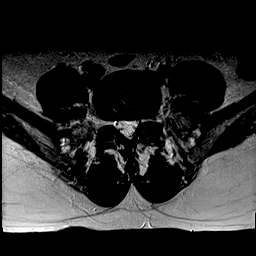
[im 8/38]
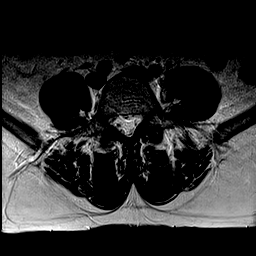
[im 10/38]
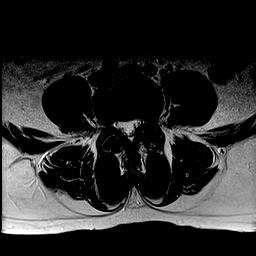
[im 13/38]
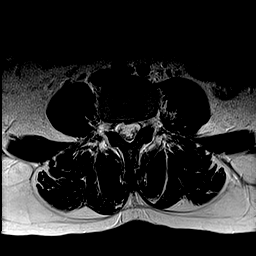
[im 15/38]
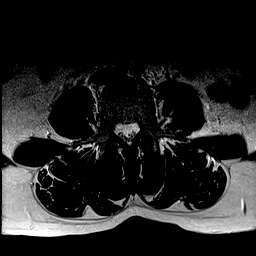
[im 18/38]
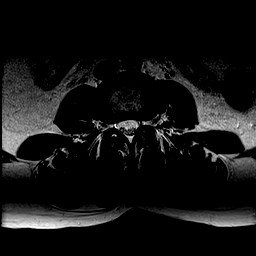
[im 20/38]
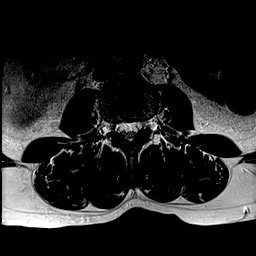
[im 23/38]
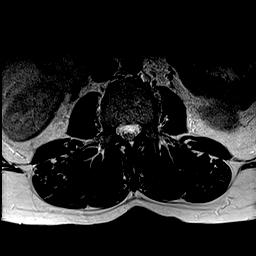
[im 25/38]
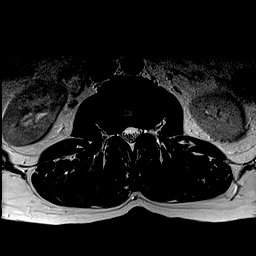
[im 28/38]
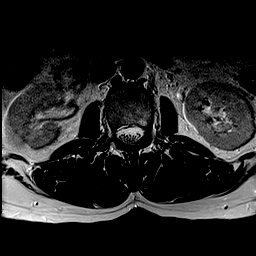
[im 30/38]
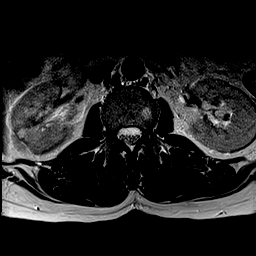
[im 33/38]
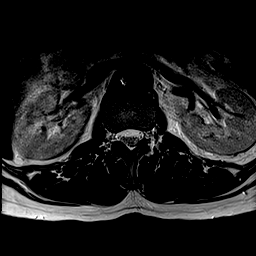
[im 35/38]
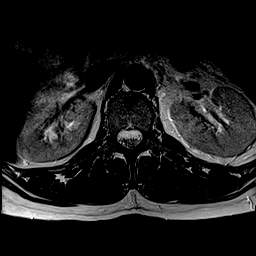
[im 38/38]
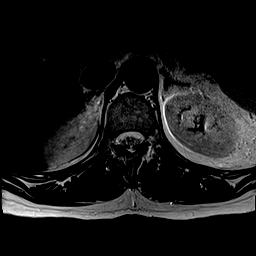

[47 of 48 positions shown; findings below may reference images not displayed]

FINDINGS: Segmentation:  Normal

Alignment:  Normal

Vertebrae:  Normal bone marrow.  Negative for fracture or mass.

Conus medullaris and cauda equina: Conus extends to the T12-L1
level. Conus and cauda equina appear normal.

Paraspinal and other soft tissues: Negative

Disc levels:

L1-2: Negative

L2-3: Right-sided disc protrusion. Subarticular and foraminal
stenosis with impingement of the right L2 and L3 nerve roots. Mild
spinal stenosis. Mild facet degeneration bilaterally.

L3-4: Mild facet degeneration. Negative for disc protrusion or
stenosis

L4-5: Mild disc bulging. Small annular fissure on the right without
focal disc protrusion. Bilateral facet hypertrophy. Mild
subarticular stenosis bilaterally

L5-S1: Small left foraminal disc protrusion. Bilateral facet
degeneration. Moderate left foraminal encroachment with impingement
of the left L5 nerve root.
IMPRESSION: Right foraminal disc protrusion at L2-3 with subarticular foraminal
stenosis on the right

Mild subarticular stenosis bilaterally L4-5

Small left foraminal disc protrusion with moderate left foraminal
encroachment at L5-S1.

## 2020-10-20 ENCOUNTER — Other Ambulatory Visit: Payer: Self-pay | Admitting: Physician Assistant

## 2020-10-20 ENCOUNTER — Other Ambulatory Visit: Payer: Self-pay | Admitting: Orthopaedic Surgery

## 2020-10-20 ENCOUNTER — Other Ambulatory Visit: Payer: Self-pay | Admitting: Family Medicine

## 2020-10-20 DIAGNOSIS — M542 Cervicalgia: Secondary | ICD-10-CM

## 2020-10-20 DIAGNOSIS — E782 Mixed hyperlipidemia: Secondary | ICD-10-CM

## 2020-10-20 DIAGNOSIS — M2550 Pain in unspecified joint: Secondary | ICD-10-CM

## 2020-10-20 MED FILL — Fluticasone Propionate Nasal Susp 50 MCG/ACT: NASAL | 30 days supply | Qty: 16 | Fill #2 | Status: AC

## 2020-10-20 MED FILL — Gabapentin Cap 300 MG: ORAL | 30 days supply | Qty: 120 | Fill #2 | Status: AC

## 2020-10-20 MED FILL — Ondansetron Orally Disintegrating Tab 8 MG: ORAL | 20 days supply | Qty: 20 | Fill #1 | Status: AC

## 2020-10-21 ENCOUNTER — Other Ambulatory Visit: Payer: Self-pay

## 2020-10-21 MED ORDER — DICLOFENAC SODIUM 50 MG PO TBEC
DELAYED_RELEASE_TABLET | ORAL | 0 refills | Status: DC
  Filled 2020-10-21: qty 60, 30d supply, fill #0

## 2020-10-21 MED ORDER — ROSUVASTATIN CALCIUM 20 MG PO TABS
ORAL_TABLET | ORAL | 2 refills | Status: DC
  Filled 2020-10-21: qty 30, 30d supply, fill #0
  Filled 2020-11-16 – 2020-11-24 (×2): qty 30, 30d supply, fill #1
  Filled 2020-12-23: qty 30, 30d supply, fill #2

## 2020-10-21 NOTE — Telephone Encounter (Signed)
Please advise 

## 2020-10-21 NOTE — Telephone Encounter (Signed)
Notes to clinic:  Verify directions for refill    Requested Prescriptions  Pending Prescriptions Disp Refills   diclofenac (VOLTAREN) 50 MG EC tablet 60 tablet 0      Analgesics:  NSAIDS Passed - 10/20/2020  4:51 PM      Passed - Cr in normal range and within 360 days    Creat  Date Value Ref Range Status  11/27/2015 0.75 0.50 - 1.10 mg/dL Final   Creatinine, Ser  Date Value Ref Range Status  07/30/2020 0.76 0.44 - 1.00 mg/dL Final          Passed - HGB in normal range and within 360 days    Hemoglobin  Date Value Ref Range Status  07/30/2020 14.2 12.0 - 15.0 g/dL Final  05/28/2020 11.8 11.1 - 15.9 g/dL Final          Passed - Patient is not pregnant      Passed - Valid encounter within last 12 months    Recent Outpatient Visits           1 week ago UTI symptoms   [place], Vernia Buff, NP   4 months ago Other spondylosis with radiculopathy, cervical region   [place], [place], MD   4 months ago [place], [place], [place]   7 months ago [place], [place], [place]   7 months ago Neck pain   [place], MD       Future Appointments             In 1 month Margarita Rana, [place], MD [place]               rosuvastatin (CRESTOR) 20 MG tablet 30 tablet 0      Cardiovascular:  Antilipid - Statins Failed - 10/20/2020  4:51 PM      Failed - Total Cholesterol in normal range and within 360 days    Cholesterol, Total  Date Value Ref Range Status  08/24/2019 147 100 - 199 mg/dL Final          Failed - LDL in normal range and within 360 days    LDL Chol Calc (NIH)  Date Value Ref Range Status  08/24/2019 62 0 - 99 mg/dL Final          Failed - HDL in normal range and within 360 days    HDL  Date Value Ref  Range Status  08/24/2019 69 >39 mg/dL Final          Failed - Triglycerides in normal range and within 360 days    Triglycerides  Date Value Ref Range Status  08/24/2019 87 0 - 149 mg/dL Final          Passed - Patient is not pregnant      Passed - Valid encounter within last 12 months    Recent Outpatient Visits           1 week ago UTI symptoms   [place], Vernia Buff, NP   4 months ago Other spondylosis with radiculopathy, cervical region   [place], Enobong, MD   4 months ago Smoking   [place], [place]   7 months  ago [place], [place]   7 months ago Neck pain   [place], MD       Future Appointments             In 1 month Charlott Rakes, MD [place]

## 2020-10-22 ENCOUNTER — Other Ambulatory Visit: Payer: Self-pay

## 2020-10-22 MED ORDER — TIZANIDINE HCL 4 MG PO TABS
4.0000 mg | ORAL_TABLET | Freq: Two times a day (BID) | ORAL | 0 refills | Status: DC | PRN
  Filled 2020-10-22: qty 40, 20d supply, fill #0

## 2020-10-24 ENCOUNTER — Other Ambulatory Visit: Payer: Self-pay

## 2020-10-27 ENCOUNTER — Other Ambulatory Visit (HOSPITAL_COMMUNITY): Payer: Self-pay

## 2020-10-27 ENCOUNTER — Encounter (HOSPITAL_COMMUNITY): Payer: Self-pay

## 2020-10-27 ENCOUNTER — Telehealth (INDEPENDENT_AMBULATORY_CARE_PROVIDER_SITE_OTHER): Payer: No Payment, Other | Admitting: Psychiatry

## 2020-10-27 DIAGNOSIS — F331 Major depressive disorder, recurrent, moderate: Secondary | ICD-10-CM

## 2020-10-27 DIAGNOSIS — M545 Low back pain, unspecified: Secondary | ICD-10-CM

## 2020-10-27 DIAGNOSIS — M797 Fibromyalgia: Secondary | ICD-10-CM | POA: Diagnosis not present

## 2020-10-27 DIAGNOSIS — F411 Generalized anxiety disorder: Secondary | ICD-10-CM

## 2020-10-27 MED ORDER — GABAPENTIN 300 MG PO CAPS
600.0000 mg | ORAL_CAPSULE | Freq: Two times a day (BID) | ORAL | 2 refills | Status: DC
  Filled 2020-10-27 – 2020-11-24 (×3): qty 120, 30d supply, fill #0
  Filled 2020-12-23: qty 120, 30d supply, fill #1
  Filled 2021-01-16: qty 120, 30d supply, fill #2

## 2020-10-27 MED ORDER — QUETIAPINE FUMARATE 25 MG PO TABS
25.0000 mg | ORAL_TABLET | Freq: Every day | ORAL | 2 refills | Status: DC
  Filled 2020-10-27 – 2020-11-24 (×3): qty 30, 30d supply, fill #0
  Filled 2020-12-23: qty 30, 30d supply, fill #1
  Filled 2021-01-16: qty 30, 30d supply, fill #2

## 2020-10-27 MED ORDER — ESCITALOPRAM OXALATE 20 MG PO TABS
20.0000 mg | ORAL_TABLET | Freq: Every day | ORAL | 2 refills | Status: DC
  Filled 2020-10-27 – 2020-11-24 (×3): qty 30, 30d supply, fill #0
  Filled 2020-12-23: qty 30, 30d supply, fill #1
  Filled 2021-01-16: qty 30, 30d supply, fill #2

## 2020-10-27 NOTE — Progress Notes (Signed)
Psychiatric Initial Adult Assessment   Patient Identification: Isabel Evans MRN:  [id number] Date of Evaluation:  10/27/2020 Referral Source: PCP Chief Complaint:  Depression and anxiety   Visit Diagnosis:  Major depressive disorder, moderate; General anxiety disorder  History of Present Illness:   50 yo female with depression and anxiety, recent cervical surgery with relief from her numbness and tingling, less pain.  Returned to work with an increase in stress.  Continues to suffer with fibromyalgia and chronic DDD in her back.  Stress includes taking care of her mother who was recently diagnosed with Alzheimers. She does have some home health assistance now.  Upset that her sister lives with her mother yet lends very little in assistance. Frustrated at her work's lack of support. Her depression is better 5/10 with ten being the highest, no suicidal ideations.  Anxiety fluctuates with stress, no panic attacks, 5/10 anxiety.  The Seroquel is effective for her sleep (initiation and maintenance), appetite is steady. Supportive boyfriend.  No homicidal ideations, hallucinations, or mania symptoms.  Denies side effects from her medications, follow-up in 3 months.  Associated Signs/Symptoms: Depression Symptoms:  depressed mood, anxiety, panic attacks, (Hypo) Manic Symptoms:   none Anxiety Symptoms:  Excessive Worry, Panic Symptoms, Psychotic Symptoms:   none PTSD Symptoms: NA  Past Psychiatric History: depression and anxiety  Previous Psychotropic Medications: Yes   Substance Abuse History in the last 12 months:  Yes.    Consequences of Substance Abuse: NA  Past Medical History:  Past Medical History:  Diagnosis Date   Anemia 11/24/2017   Anxiety    Bone spur    cervical spine   Chronic kidney disease 04/2017   kidney infections   Depression    Deviated nasal septum    states is unable to lie flat, because her nose will become congested and she can stop breathing    Diabetes mellitus without complication (HCC)    Dysrhythmia    heart flutters occasionally   Fibromyalgia    Fibromyalgia    GERD (gastroesophageal reflux disease)    Hallux abductovalgus with bunions 09/2014   right    Hammertoe 09/2014   right 4th, 5th   History of stomach ulcers    Hyperlipidemia    Hypertension    states is under control with med., has been on med. x 8 mos.   IBS (irritable bowel syndrome)    no current med.   Osteoarthritis    hands, bilateral hips   Peripheral vascular disease (HCC)    occasional swelling in both legs   Sinus headache     Past Surgical History:  Procedure Laterality Date   ABDOMINAL HYSTERECTOMY     ANTERIOR CERVICAL DECOMP/DISCECTOMY FUSION N/A 07/30/2020   Procedure: C5-6 ANTERIOR CERVICAL DECOMPRESSION/DISCECTOMY FUSION, ALLOGRAFT, PLATE;  Surgeon: Marybelle Killings, MD;  Location: [facility];  Service: Orthopedics;  Laterality: N/A;   BREAST BIOPSY Left 04/2018   benign   BREAST BIOPSY Left 2016   benign   BUNIONECTOMY Right 10/04/2014   Procedure:  Altamese Hawthorne, Emmaline Life;  Surgeon: Trula Slade, DPM;  Location: [facility];  Service: Podiatry;  Laterality: Right;   COLONOSCOPY WITH PROPOFOL  02/13/2013   CYSTOSCOPY N/A 07/07/2017   Procedure: CYSTOSCOPY;  Surgeon: Aletha Halim, MD;  Location: [facility];  Service: Gynecology;  Laterality: N/A;   DUPUYTREN / PALMAR FASCIOTOMY Right 07/18/2013   exc. 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   small finger  HAMMER TOE SURGERY Right 10/04/2014   Procedure: HAMMER TOE REPAIR 4TH  AND 5TH  RIGHT FOOT  fourth toe fixation with k wire;  Surgeon: Trula Slade, DPM;  Location: [facility];  Service: Podiatry;  Laterality: Right;   LEG SURGERY Left    as a child; near amputation of leg   TUBAL LIGATION     VAGINAL HYSTERECTOMY N/A 07/07/2017   Procedure: HYSTERECTOMY VAGINAL;  Surgeon: Aletha Halim, MD;  Location: [facility];  Service: Gynecology;  Laterality: N/A;    Family Psychiatric History: see below, mother and sister with depression and anxiety  Family History:  Family History  Problem Relation Age of Onset   Hypertension Mother    Diabetes Mother    Heart disease Mother    Hypertension Father    Diabetes Father    Prostate cancer Father    Heart disease Father    Alcohol abuse Father    Hypertension Sister    Diabetes Sister    Heart disease Maternal Grandmother        Great GM   Breast cancer Maternal Grandmother    Bone cancer Maternal Grandmother    Breast cancer Maternal Aunt    Ovarian cancer Maternal Aunt    Rheum arthritis Sister    Colon cancer Other        great Aunt   Rectal cancer Neg Hx    Stomach cancer Neg Hx     Social History:   Social History   Socioeconomic History   Marital status: Single    Spouse name: Not on file   Number of children: 3   Years of education: Not on file   Highest education level: 7th grade  Occupational History   Occupation: Training and development officer  Tobacco Use   Smoking status: Former    Packs/day: 1.00    Years: 20.00    Pack years: 20.00    Types: Cigarettes    Quit date: 08/24/2012    Years since quitting: 8.1   Smokeless tobacco: Never  Vaping Use   Vaping Use: Some days  Substance and Sexual Activity   Alcohol use: Yes    Comment: occasionally   Drug use: Yes    Types: Marijuana   Sexual activity: Yes    Birth control/protection: Surgical  Other Topics Concern   Not on file  Social History Narrative   Not on file   Social Determinants of Health   Financial Resource Strain: Not on file  Food Insecurity: Not on file  Transportation Needs: Not on file  Physical Activity: Not on file  Stress: Not on file  Social Connections: Not on file    Additional Social History: lives with supportive partner, works part-time  Allergies:  No Known Allergies  Metabolic Disorder Labs: Lab Results  Component Value Date   HGBA1C 5.9 (H)  05/28/2020   No results found for: PROLACTIN Lab Results  Component Value Date   CHOL 147 08/24/2019   TRIG 87 08/24/2019   HDL 69 08/24/2019   CHOLHDL 2.1 08/24/2019   [place] 62 08/24/2019   [place] 60 07/06/2019   Lab Results  Component Value Date   TSH 0.606 01/04/2020    Therapeutic Level Labs: No results found for: LITHIUM No results found for: CBMZ No results found for: VALPROATE  Current Medications: Current Outpatient Medications  Medication Sig Dispense Refill   amLODipine (NORVASC) 5 MG tablet Take 1 tablet (5 mg total) by mouth daily. 30 tablet 2  diclofenac (VOLTAREN) 50 MG EC tablet TAKE 1 TABLET (50 MG TOTAL) BY MOUTH 2 (TWO) TIMES DAILY. AFTER A MEAL AS NEEDED FOR PAIN 60 tablet 0   dicyclomine (BENTYL) 10 MG capsule TAKE 1 CAPSULE BY MOUTH 2 TIMES DAILY AS NEEDED 60 capsule 2   Digestive Enzymes (PAPAYA AND ENZYMES PO) Take 1 tablet by mouth at bedtime as needed (IBS).     diphenhydrAMINE (BENADRYL) 25 MG tablet Take 25 mg by mouth daily.     escitalopram (LEXAPRO) 20 MG tablet Take 1 tablet (20 mg total) by mouth daily. 30 tablet 2   fluticasone (FLONASE) 50 MCG/ACT nasal spray PLACE 2 SPRAYS INTO BOTH NOSTRILS DAILY. (Patient taking differently: Place 2 sprays into both nostrils daily as needed.) 16 g 11   gabapentin (NEURONTIN) 300 MG capsule TAKE 1 CAPSULE BY MOUTH 2 TIMES DAILY AND 2 CAPSULES AT BEDTIME. (Patient taking differently: Take 600 mg by mouth 2 (two) times daily.) 120 capsule 2   metFORMIN (GLUCOPHAGE) 500 MG tablet TAKE 1 TABLET (500 MG TOTAL) BY MOUTH DAILY WITH BREAKFAST. 90 tablet 0   omeprazole (PRILOSEC) 20 MG capsule TAKE 2 CAPSULES (40 MG TOTAL) BY MOUTH DAILY. 60 capsule 2   ondansetron (ZOFRAN-ODT) 8 MG disintegrating tablet TAKE 1 TABLET (8 MG TOTAL) BY MOUTH DAILY AS NEEDED FOR NAUSEA OR VOMITING. 20 tablet 2   polyethylene glycol powder (GLYCOLAX/MIRALAX) 17 GM/SCOOP powder TAKE AS DIRECTED BY PHYSICIAN AS 1 DOSE 476 g 0   QUEtiapine  (SEROQUEL) 25 MG tablet Take 1 tablet (25 mg total) by mouth at bedtime. 30 tablet 2   rosuvastatin (CRESTOR) 20 MG tablet TAKE 1 TABLET (20 MG TOTAL) BY MOUTH DAILY. TO LOWER CHOLESTEROL 30 tablet 2   tiZANidine (ZANAFLEX) 4 MG tablet START WITH 1/2 TABLET BY MOUTH EVERY 8 HOURS. MAY CAUSE DROWSINEES. INCREASE TO 1 TABLET EVERY 8 HOURS AS NEEDED FOR MUSCLE IF TOLERABLE. 90 tablet 0   tiZANidine (ZANAFLEX) 4 MG tablet Take 1 tablet (4 mg total) by mouth 2 (two) times daily as needed for muscle spasms. 40 tablet 0   traMADol (ULTRAM) 50 MG tablet Take 1 tablet (50 mg total) by mouth 3 (three) times daily as needed for pain 20 tablet 0   No current facility-administered medications for this visit.    Musculoskeletal: Strength & Muscle Tone: within normal limits, per patient report Gait & Station: did not witness, telephone visit Patient leans: denies  Psychiatric Specialty Exam: Review of Systems  Psychiatric/Behavioral:  Positive for dysphoric mood. The patient is nervous/anxious.   All other systems reviewed and are negative.  Last menstrual period 07/04/2017.There is no height or weight on file to calculate BMI.  General Appearance: UTA, telephone visit  Eye Contact:  UTA  Speech:  Normal Rate  Volume:  Normal  Mood:  Anxious and Depressed  Affect:  UTA  Thought Process:  Coherent and Descriptions of Associations: Intact  Orientation:  Full (Time, Place, and Person)  Thought Content:  WDL and Logical  Suicidal Thoughts:  No  Homicidal Thoughts:  No  Memory:  Immediate;   Good Recent;   Good Remote;   Good  Judgement:  Good  Insight:  Good  Psychomotor Activity:  Normal  Concentration:  Concentration: Good and Attention Span: Good  Recall:  Good  Fund of Knowledge:Good  Language: Good  Akathisia:  No  Handed:  Right  AIMS (if indicated):  not needed  Assets:  Housing Intimacy Leisure Time Resilience Social Support  ADL's:  Intact  Cognition: WNL  Sleep:  Fair    Screenings: GAD-7    Flowsheet Row Office Visit from 06/19/2020 in [facility] Video Visit from 05/05/2020 in [facility] Office Visit from 01/04/2020 in [facility] Office Visit from 07/06/2019 in [facility] Office Visit from 09/06/2018 in [facility]  Total GAD-7 Score 14 20 16 17 20       PHQ2-9    [place] Office Visit from 06/19/2020 in [facility] Video Visit from 06/09/2020 in [facility] from 03/24/2020 in [facility] Office Visit from 02/28/2020 in [facility] Visit from 01/04/2020 in [facility]  PHQ-2 Total Score 4 0 1 5 4   PHQ-9 Total Score 15 -- 4 16 15       Flowsheet Row Admission (Discharged) from 07/30/2020 in [facility] Video Visit from 06/09/2020 in [facility] from 12/26/2017 in [facility] No Risk No Risk Low Risk       Assessment and Plan:  General anxiety disorder and panic disorder: -Continue Klonopin 0.5 mg daily PRN per PCP if warranted for panic attacks -Continue gabapentin 300 mg TID for anxiety and neuropathic pain  Major Depressive Disorder, recurrent, moderate -Continue Lexapro 20 mg daily -Establish and start therapy at [facility] or another facility -Reevaluate in 1 month  Chronic back Pain: -Continue care with MD  Insomnia: -Continue Seroquel 25 mg at bedtime PRN sleep  Virtual Visit via Video Note  I connected with Isabel Evans on 10/27/20 at  9:00 AM EDT by a video enabled telemedicine application and verified that I am speaking with the correct person using two  identifiers.  Location: Patient: home Provider: home office     I discussed the limitations of evaluation and management by telemedicine and the availability of in person appointments. The patient expressed understanding and agreed to proceed.  Follow Up Instructions: Follow up in 3 months   I discussed the assessment and treatment plan with the patient. The patient was provided an opportunity to ask questions and all were answered. The patient agreed with the plan and demonstrated an understanding of the instructions.   The patient was advised to call back or seek an in-person evaluation if the symptoms worsen or if the condition fails to improve as anticipated.  I provided 25 minutes of non-face-to-face time during this encounter.   Waylan Boga, NP  Waylan Boga, NP 7/11/20229:04 AM

## 2020-10-28 ENCOUNTER — Other Ambulatory Visit: Payer: Self-pay | Admitting: Physician Assistant

## 2020-10-28 ENCOUNTER — Other Ambulatory Visit: Payer: Self-pay

## 2020-11-12 ENCOUNTER — Other Ambulatory Visit: Payer: Self-pay

## 2020-11-16 ENCOUNTER — Other Ambulatory Visit: Payer: Self-pay | Admitting: Physician Assistant

## 2020-11-16 ENCOUNTER — Other Ambulatory Visit: Payer: Self-pay | Admitting: Orthopaedic Surgery

## 2020-11-16 ENCOUNTER — Other Ambulatory Visit: Payer: Self-pay | Admitting: Internal Medicine

## 2020-11-16 ENCOUNTER — Other Ambulatory Visit: Payer: Self-pay | Admitting: Family Medicine

## 2020-11-16 ENCOUNTER — Encounter: Payer: Self-pay | Admitting: Family Medicine

## 2020-11-16 ENCOUNTER — Encounter: Payer: Self-pay | Admitting: Orthopaedic Surgery

## 2020-11-16 DIAGNOSIS — M545 Low back pain, unspecified: Secondary | ICD-10-CM

## 2020-11-16 DIAGNOSIS — R110 Nausea: Secondary | ICD-10-CM

## 2020-11-16 DIAGNOSIS — M2550 Pain in unspecified joint: Secondary | ICD-10-CM

## 2020-11-16 DIAGNOSIS — M542 Cervicalgia: Secondary | ICD-10-CM

## 2020-11-16 DIAGNOSIS — K219 Gastro-esophageal reflux disease without esophagitis: Secondary | ICD-10-CM

## 2020-11-16 MED ORDER — DICLOFENAC SODIUM 50 MG PO TBEC
DELAYED_RELEASE_TABLET | ORAL | 0 refills | Status: DC
Start: 2020-11-16 — End: 2020-12-23
  Filled 2020-11-16 – 2020-11-24 (×2): qty 60, 30d supply, fill #0

## 2020-11-16 MED FILL — Fluticasone Propionate Nasal Susp 50 MCG/ACT: NASAL | 30 days supply | Qty: 16 | Fill #3 | Status: CN

## 2020-11-16 NOTE — Telephone Encounter (Signed)
Requested Prescriptions  Pending Prescriptions Disp Refills  . ondansetron (ZOFRAN-ODT) 8 MG disintegrating tablet 20 tablet     Sig: TAKE 1 TABLET (8 MG TOTAL) BY MOUTH DAILY AS NEEDED FOR NAUSEA OR VOMITING.     Not Delegated - Gastroenterology: Antiemetics Failed - 11/16/2020  3:37 PM      Failed - This refill cannot be delegated      Passed - Valid encounter within last 6 months    Recent Outpatient Visits          1 month ago UTI symptoms   [place], Vernia Buff, NP   5 months ago Other spondylosis with radiculopathy, cervical region   [place], [place], MD   5 months ago Smoking   [place], [place], [place]   7 months ago [place], [place]   8 months ago Neck pain   [place], MD      Future Appointments            In 2 days Marybelle Killings, MD [place]   In 4 days Charlott Rakes, MD [place]           . tiZANidine (ZANAFLEX) 4 MG tablet 90 tablet     Sig: START WITH 1/2 TABLET BY MOUTH EVERY 8 HOURS. MAY CAUSE DROWSINEES. INCREASE TO 1 TABLET EVERY 8 HOURS AS NEEDED FOR MUSCLE IF TOLERABLE.     Not Delegated - Cardiovascular:  Alpha-2 Agonists - tizanidine Failed - 11/16/2020  3:37 PM      Failed - This refill cannot be delegated      Passed - Valid encounter within last 6 months    Recent Outpatient Visits          1 month ago UTI symptoms   [place], [place] W, NP   5 months ago Other spondylosis with radiculopathy, cervical region   [place], Enobong, MD   5 months ago Smoking   [place], [place]   7 months ago [place], [place]   8 months ago Neck pain   [place], MD      Future Appointments            In 2 days Marybelle Killings, MD [place]   In 4 days Charlott Rakes, MD [place]           . diclofenac (VOLTAREN) 50 MG EC tablet 60 tablet 0    Sig: TAKE 1 TABLET (50 MG TOTAL) BY MOUTH 2 (TWO) TIMES DAILY. AFTER A MEAL AS NEEDED FOR PAIN     Analgesics:  NSAIDS Passed - 11/16/2020  3:37 PM      Passed - Cr in normal range and within 360 days    Creat  Date Value Ref Range Status  11/27/2015 0.75 0.50 - 1.10 mg/dL Final   Creatinine, Ser  Date Value Ref Range Status  07/30/2020 0.76 0.44 - 1.00 mg/dL Final         Passed - HGB in normal range and within 360 days  Hemoglobin  Date Value Ref Range Status  07/30/2020 14.2 12.0 - 15.0 g/dL Final  05/28/2020 11.8 11.1 - 15.9 g/dL Final         Passed - Patient is not pregnant      Passed - Valid encounter within last 12 months    Recent Outpatient Visits          1 month ago UTI symptoms   [place], [place] W, NP   5 months ago Other spondylosis with radiculopathy, cervical region   [place], Enobong, MD   5 months ago Smoking   [place], [place], [place]   7 months ago [place], [place]   8 months ago Neck pain   [place], MD      Future Appointments            In 2 days Marybelle Killings, MD [place]   In 4 days Charlott Rakes, MD [place]

## 2020-11-16 NOTE — Telephone Encounter (Signed)
Requested medication (s) are due for refill today: yes to both meds  Requested medication (s) are on the active medication list: yes to both meds  Last refill:  Zofran: 07/01/20          Zanaflex: 08/13/20   Future visit scheduled: yes  Notes to clinic:  meds not delegated to NT to RF   Requested Prescriptions  Pending Prescriptions Disp Refills   ondansetron (ZOFRAN-ODT) 8 MG disintegrating tablet 20 tablet     Sig: TAKE 1 TABLET (8 MG TOTAL) BY MOUTH DAILY AS NEEDED FOR NAUSEA OR VOMITING.      Not Delegated - Gastroenterology: Antiemetics Failed - 11/16/2020  3:37 PM      Failed - This refill cannot be delegated      Passed - Valid encounter within last 6 months    Recent Outpatient Visits           1 month ago UTI symptoms   Isabel Evans, [place] W, NP   5 months ago Other spondylosis with radiculopathy, cervical region   Isabel Evans, Enobong, Isabel Evans   5 months ago Smoking   Isabel Evans, Isabel Evans, Isabel Evans   7 months ago Isabel Evans, Isabel Evans, Isabel Evans   8 months ago Neck pain   Isabel Evans, [place], Isabel Evans       Future Appointments             In 2 days Isabel Killings, Isabel Evans [place]   In 4 days Isabel Rakes, Isabel Evans [place]               tiZANidine (ZANAFLEX) 4 MG tablet 90 tablet     Sig: START WITH 1/2 TABLET BY MOUTH EVERY 8 HOURS. MAY CAUSE DROWSINEES. INCREASE TO 1 TABLET EVERY 8 HOURS AS NEEDED FOR MUSCLE IF TOLERABLE.      Not Delegated - Cardiovascular:  Alpha-2 Agonists - tizanidine Failed - 11/16/2020  3:37 PM      Failed - This refill cannot be delegated      Passed - Valid encounter within last 6 months    Recent Outpatient Visits           1 month ago UTI symptoms   Isabel Evans,  [place] W, NP   5 months ago Other spondylosis with radiculopathy, cervical region   [place], En[place], Isabel Evans   5 months ago Smoking   Isabel Evans, Isabel Evans, Isabel Evans   7 months ago Isabel Evans, Isabel Evans, Isabel Evans   8 months ago Neck pain   [place], [place], Isabel Evans       Future Appointments             In 2 days Isabel Killings, Isabel Evans Isabel Evans   In 4 days Isabel Rakes, Isabel Evans Isabel Evans              Signed Prescriptions Disp Refills   diclofenac (VOLTAREN) 50 MG EC tablet 60 tablet 0    Sig: TAKE 1 TABLET (50 MG TOTAL) BY MOUTH 2 (TWO) TIMES DAILY. AFTER A MEAL AS NEEDED FOR PAIN  Analgesics:  NSAIDS Passed - 11/16/2020  3:37 PM      Passed - Cr in normal range and within 360 days    Creat  Date Value Ref Range Status  11/27/2015 0.75 0.50 - 1.10 mg/dL Final   Creatinine, Ser  Date Value Ref Range Status  07/30/2020 0.76 0.44 - 1.00 mg/dL Final          Passed - HGB in normal range and within 360 days    Hemoglobin  Date Value Ref Range Status  07/30/2020 14.2 12.0 - 15.0 g/dL Final  05/28/2020 11.8 11.1 - 15.9 g/dL Final          Passed - Patient is not pregnant      Passed - Valid encounter within last 12 months    Recent Outpatient Visits           1 month ago UTI symptoms   [place], Vernia Buff, NP   5 months ago Other spondylosis with radiculopathy, cervical region   [place], Charlane Ferretti, Isabel Evans   5 months ago Smoking   [place], [place], Isabel Evans   7 months ago [place], Isabel Evans   8 months ago Neck pain   [place], Isabel Evans       Future  Appointments             In 2 days Isabel Killings, Isabel Evans [place]   In 4 days Isabel Rakes, Isabel Evans [place]

## 2020-11-17 ENCOUNTER — Other Ambulatory Visit: Payer: Self-pay

## 2020-11-17 ENCOUNTER — Other Ambulatory Visit: Payer: Self-pay | Admitting: Family Medicine

## 2020-11-17 DIAGNOSIS — N951 Menopausal and female climacteric states: Secondary | ICD-10-CM

## 2020-11-17 MED ORDER — ONDANSETRON 8 MG PO TBDP
8.0000 mg | ORAL_TABLET | Freq: Every day | ORAL | 0 refills | Status: DC | PRN
  Filled 2020-11-17 – 2020-11-24 (×2): qty 20, 20d supply, fill #0

## 2020-11-17 MED ORDER — TIZANIDINE HCL 4 MG PO TABS
4.0000 mg | ORAL_TABLET | Freq: Two times a day (BID) | ORAL | 0 refills | Status: DC | PRN
  Filled 2020-11-17 – 2020-11-24 (×2): qty 40, 20d supply, fill #0

## 2020-11-17 NOTE — Telephone Encounter (Signed)
Please advise 

## 2020-11-17 NOTE — Telephone Encounter (Signed)
noted 

## 2020-11-18 ENCOUNTER — Encounter: Payer: Self-pay | Admitting: Orthopaedic Surgery

## 2020-11-18 ENCOUNTER — Ambulatory Visit (INDEPENDENT_AMBULATORY_CARE_PROVIDER_SITE_OTHER): Payer: Self-pay | Admitting: Orthopaedic Surgery

## 2020-11-18 ENCOUNTER — Other Ambulatory Visit: Payer: Self-pay

## 2020-11-18 ENCOUNTER — Ambulatory Visit (INDEPENDENT_AMBULATORY_CARE_PROVIDER_SITE_OTHER): Payer: Self-pay

## 2020-11-18 VITALS — BP 103/68 | HR 70 | Ht 71.0 in | Wt 156.0 lb

## 2020-11-18 DIAGNOSIS — G8929 Other chronic pain: Secondary | ICD-10-CM

## 2020-11-18 DIAGNOSIS — Z981 Arthrodesis status: Secondary | ICD-10-CM

## 2020-11-18 DIAGNOSIS — M545 Low back pain, unspecified: Secondary | ICD-10-CM

## 2020-11-18 NOTE — Progress Notes (Signed)
z  Office Visit Note   Patient: Isabel Evans           Date of Birth: 07/01/1970           MRN: [id number] Visit Date: 11/18/2020              Requested by: Charlott Rakes, MD [place],  [place] [address] PCP: Charlott Rakes, MD   Assessment & Plan: Visit Diagnoses:  1. S/P cervical spinal fusion   2. Chronic bilateral low back pain, unspecified whether sciatica present   3. Chronic midline low back pain without sciatica     Plan: We will set patient up for some physical therapy for evaluation of her low back pain symptoms.  Follow-Up Instructions: Return in about 7 weeks (around 01/06/2021).   Orders:  Orders Placed This Encounter  Procedures   XR Cervical Spine 2 or 3 views   Ambulatory referral to Physical Therapy   No orders of the defined types were placed in this encounter.     Procedures: No procedures performed   Clinical Data: No additional findings.   Subjective: Chief Complaint  Patient presents with   Neck - Follow-up    07/30/2020 C5-6 ACDF    HPI 50 year old female returns with post April single level cervical fusion at C5-6.  Since last months she states her neck actually seems to be getting better less discomfort with turning and twisting.  Still some pain posteriorly along the trapezius but not as bad.  Some pain between the scapula.  She has had more low back pain that radiates into her buttocks some over trochanter.  No associated bowel or bladder symptoms more symptoms in both buttocks.  Previous x-rays 2019 2015 showed normal lumbar spine architecture.  She denies fever chills no bowel bladder symptoms.  She is only working part-time due to her symptoms.  Review of Systems all other systems noncontributory to HPI.   Objective: Vital Signs: BP 103/68   Pulse 70   Ht '5\' 11"'$  (1.803 m)   Wt 156 lb (70.8 kg)   LMP 07/04/2017   BMI 21.76 kg/m   Physical Exam Constitutional:      Appearance: She is well-developed.   HENT:     Head: Normocephalic.     Right Ear: External ear normal.     Left Ear: External ear normal. There is no impacted cerumen.  Eyes:     Pupils: Pupils are equal, round, and reactive to light.  Neck:     Thyroid: No thyromegaly.     Trachea: No tracheal deviation.  Cardiovascular:     Rate and Rhythm: Normal rate.  Pulmonary:     Effort: Pulmonary effort is normal.  Abdominal:     Palpations: Abdomen is soft.  Musculoskeletal:     Cervical back: No rigidity.  Skin:    General: Skin is warm and dry.  Neurological:     Mental Status: She is alert and oriented to person, place, and time.  Psychiatric:        Behavior: Behavior normal.    Ortho Exam patient has negative Spurling no brachial plexus tenderness right or left today.  Good flexion extension and rotation cervical spine.  Mild sciatic notch tenderness minimal trochanteric bursal tenderness right and left.  Negative logroll the hips negative straight leg raising 90 degrees knee and ankle jerk are 2+ she is able to heel and toe walk.  Specialty Comments:  No specialty comments available.  Imaging: No  results found.   PMFS History: Patient Active Problem List   Diagnosis Date Noted   S/P cervical spinal fusion 09/16/2020   Cervical spinal stenosis 07/30/2020   Other spondylosis with radiculopathy, cervical region 06/18/2020   Major depressive disorder, recurrent episode, moderate (HCC) 05/26/2020   Protrusion of cervical intervertebral disc 03/22/2018   Anemia 11/24/2017   Pre-diabetes 11/24/2017   Blurry vision, bilateral 05/18/2017   Callus of foot 05/18/2017   Pain in joint of left hip 02/16/2017   Bruises easily 11/10/2016   Gastroesophageal reflux disease without esophagitis 09/22/2016   Fatigue associated with anemia 09/22/2016   Flexion contractures 09/22/2016   Generalized anxiety disorder 01/01/2016   Dental abscess 01/01/2016   Healthcare maintenance 05/08/2015   Adjustment disorder with  mixed anxiety and depressed mood 05/08/2015   Stress at home 10/24/2014   Irritable bowel syndrome 10/24/2014   Anxiety state 10/24/2014   Preop testing 09/19/2014   Pap smear for cervical cancer screening 09/19/2014   Midline low back pain without sciatica 08/12/2014   [place] nodes 08/12/2014   Contracture of joint, hand 08/12/2014   Essential hypertension 02/11/2014   Allergy 02/11/2014   Screening for breast cancer 02/11/2014   Dupuytren's contracture of both hands 11/15/2013   Hallux valgus of right foot 11/15/2013   Back pain 05/21/2013   UTI (urinary tract infection) 05/21/2013   Multiple skin nodules 11/27/2012   Fibromyalgia 09/08/2012   Osteoarthritis 09/08/2012   Neck muscle spasm 07/14/2012   Plantar wart 07/14/2012   Insomnia 07/14/2012   Past Medical History:  Diagnosis Date   Anemia 11/24/2017   Anxiety    Bone spur    cervical spine   Chronic kidney disease 04/2017   kidney infections   Depression    Deviated nasal septum    states is unable to lie flat, because her nose will become congested and she can stop breathing   Diabetes mellitus without complication (HCC)    Dysrhythmia    heart flutters occasionally   Fibromyalgia    Fibromyalgia    GERD (gastroesophageal reflux disease)    Hallux abductovalgus with bunions 09/2014   right    Hammertoe 09/2014   right 4th, 5th   History of stomach ulcers    Hyperlipidemia    Hypertension    states is under control with med., has been on med. x 8 mos.   IBS (irritable bowel syndrome)    no current med.   Osteoarthritis    hands, bilateral hips   Peripheral vascular disease ([place])    occasional swelling in both legs   Sinus headache     Family History  Problem Relation Age of Onset   Hypertension Mother    Diabetes Mother    Heart disease Mother    Hypertension Father    Diabetes Father    Prostate cancer Father    Heart disease Father    Alcohol abuse Father    Hypertension Sister    Diabetes  Sister    Heart disease Maternal Grandmother        Great GM   Breast cancer Maternal Grandmother    Bone cancer Maternal Grandmother    Breast cancer Maternal Aunt    Ovarian cancer Maternal Aunt    Rheum arthritis Sister    Colon cancer Other        great Aunt   Rectal cancer Neg Hx    Stomach cancer Neg Hx     Past Surgical History:  Procedure Laterality  Date   ABDOMINAL HYSTERECTOMY     ANTERIOR CERVICAL DECOMP/DISCECTOMY FUSION N/A 07/30/2020   Procedure: C5-6 ANTERIOR CERVICAL DECOMPRESSION/DISCECTOMY FUSION, ALLOGRAFT, PLATE;  Surgeon: Marybelle Killings, MD;  Location: [facility];  Service: Orthopedics;  Laterality: N/A;   BREAST BIOPSY Left 04/2018   benign   BREAST BIOPSY Left 2016   benign   BUNIONECTOMY Right 10/04/2014   Procedure:  Altamese Speed, Emmaline Life;  Surgeon: Trula Slade, DPM;  Location: [facility];  Service: Podiatry;  Laterality: Right;   COLONOSCOPY WITH PROPOFOL  02/13/2013   CYSTOSCOPY N/A 07/07/2017   Procedure: CYSTOSCOPY;  Surgeon: Aletha Halim, MD;  Location: [facility];  Service: Gynecology;  Laterality: N/A;   DUPUYTREN / PALMAR FASCIOTOMY Right 07/18/2013   exc. 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   small finger   HAMMER TOE SURGERY Right 10/04/2014   Procedure: HAMMER TOE REPAIR 4TH  AND 5TH  RIGHT FOOT  fourth toe fixation with k wire;  Surgeon: Trula Slade, DPM;  Location: [facility];  Service: Podiatry;  Laterality: Right;   LEG SURGERY Left    as a child; near amputation of leg   TUBAL LIGATION     VAGINAL HYSTERECTOMY N/A 07/07/2017   Procedure: HYSTERECTOMY VAGINAL;  Surgeon: Aletha Halim, MD;  Location: [facility];  Service: Gynecology;  Laterality: N/A;   Social History   Occupational History   Occupation: cook  Tobacco Use   Smoking status: Former    Packs/day: 1.00    Years: 20.00    Pack years: 20.00    Types: Cigarettes    Quit date: 08/24/2012     Years since quitting: 8.2   Smokeless tobacco: Never  Vaping Use   Vaping Use: Some days  Substance and Sexual Activity   Alcohol use: Yes    Comment: occasionally   Drug use: Yes    Types: Marijuana   Sexual activity: Yes    Birth control/protection: Surgical

## 2020-11-20 ENCOUNTER — Other Ambulatory Visit: Payer: Self-pay

## 2020-11-20 ENCOUNTER — Ambulatory Visit: Payer: Self-pay | Attending: Family Medicine | Admitting: Family Medicine

## 2020-11-20 ENCOUNTER — Encounter: Payer: Self-pay | Admitting: Family Medicine

## 2020-11-20 VITALS — BP 131/77 | HR 72 | Ht 69.0 in | Wt 159.0 lb

## 2020-11-20 DIAGNOSIS — I10 Essential (primary) hypertension: Secondary | ICD-10-CM

## 2020-11-20 DIAGNOSIS — R7303 Prediabetes: Secondary | ICD-10-CM

## 2020-11-20 DIAGNOSIS — M5432 Sciatica, left side: Secondary | ICD-10-CM

## 2020-11-20 DIAGNOSIS — N951 Menopausal and female climacteric states: Secondary | ICD-10-CM

## 2020-11-20 MED ORDER — OMEPRAZOLE 20 MG PO CPDR
40.0000 mg | DELAYED_RELEASE_CAPSULE | Freq: Every day | ORAL | 2 refills | Status: DC
Start: 2020-11-20 — End: 2021-02-20
  Filled 2020-11-20: qty 60, 30d supply, fill #0
  Filled 2020-12-23: qty 60, 30d supply, fill #1
  Filled 2021-01-16: qty 60, 30d supply, fill #2

## 2020-11-20 NOTE — Progress Notes (Signed)
Subjective:  Patient ID: Isabel Evans, female    DOB: 05/04/1970  Age: 50 y.o. MRN: [id number]  CC: Back Pain   HPI Isabel Evans is a 50 year old female patient of Dr Jillyn Hidden  with a history of Anxiety and Depression, spondylosis with cervical radiculopathy (status post cervical spine fusion), Dupuytren's contracture, chronic low back pain.  Interval History: Complains of pain in her spine.  Last seen by Orthopedic Dr. Ophelia Charter 2 days ago and notes reviewed indicate plan for referral to PT. Her neck pain is better but she still has some neck which has improved compared to prior to cervical spine fusion Complains of pain in her L butt cheek and in her L groin She is on Tramadol, Voltaren gel for pain from Ortho Complains of hot flashes and had requested GYN referral which is pending.  Due to her Duputyren's contracture she drops things a lot and is thinking of applying for disability.  No longer takes Amlodipine for Hypertension but hypertension is currently diet-controlled. Currently metformin for prediabetes.  Past Medical History:  Diagnosis Date   Anemia 11/24/2017   Anxiety    Bone spur    cervical spine   Chronic kidney disease 04/2017   kidney infections   Depression    Deviated nasal septum    states is unable to lie flat, because her nose will become congested and she can stop breathing   Diabetes mellitus without complication (HCC)    Dysrhythmia    heart flutters occasionally   Fibromyalgia    Fibromyalgia    GERD (gastroesophageal reflux disease)    Hallux abductovalgus with bunions 09/2014   right    Hammertoe 09/2014   right 4th, 5th   History of stomach ulcers    Hyperlipidemia    Hypertension    states is under control with med., has been on med. x 8 mos.   IBS (irritable bowel syndrome)    no current med.   Osteoarthritis    hands, bilateral hips   Peripheral vascular disease (HCC)    occasional swelling in both legs   Sinus headache     Past  Surgical History:  Procedure Laterality Date   ABDOMINAL HYSTERECTOMY     ANTERIOR CERVICAL DECOMP/DISCECTOMY FUSION N/A 07/30/2020   Procedure: C5-6 ANTERIOR CERVICAL DECOMPRESSION/DISCECTOMY FUSION, ALLOGRAFT, PLATE;  Surgeon: Eldred Manges, MD;  Location: [facility];  Service: Orthopedics;  Laterality: N/A;   BREAST BIOPSY Left 04/2018   benign   BREAST BIOPSY Left 2016   benign   BUNIONECTOMY Right 10/04/2014   Procedure:  Serafina Royals, Donzetta Sprung;  Surgeon: Vivi Barrack, DPM;  Location: [facility];  Service: Podiatry;  Laterality: Right;   COLONOSCOPY WITH PROPOFOL  02/13/2013   CYSTOSCOPY N/A 07/07/2017   Procedure: CYSTOSCOPY;  Surgeon: Callery Bing, MD;  Location: [facility];  Service: Gynecology;  Laterality: N/A;   DUPUYTREN / PALMAR FASCIOTOMY Right 07/18/2013   exc. 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   small finger   HAMMER TOE SURGERY Right 10/04/2014   Procedure: HAMMER TOE REPAIR 4TH  AND 5TH  RIGHT FOOT  fourth toe fixation with k wire;  Surgeon: Vivi Barrack, DPM;  Location: [facility];  Service: Podiatry;  Laterality: Right;   LEG SURGERY Left    as a child; near amputation of leg   TUBAL LIGATION     VAGINAL HYSTERECTOMY N/A 07/07/2017   Procedure: HYSTERECTOMY VAGINAL;  Surgeon: Aletha Halim, MD;  Location: [facility];  Service: Gynecology;  Laterality: N/A;    Family History  Problem Relation Age of Onset   Hypertension Mother    Diabetes Mother    Heart disease Mother    Hypertension Father    Diabetes Father    Prostate cancer Father    Heart disease Father    Alcohol abuse Father    Hypertension Sister    Diabetes Sister    Heart disease Maternal Grandmother        Great GM   Breast cancer Maternal Grandmother    Bone cancer Maternal Grandmother    Breast cancer Maternal Aunt    Ovarian cancer Maternal Aunt    Rheum arthritis Sister    Colon cancer Other        great  Aunt   Rectal cancer Neg Hx    Stomach cancer Neg Hx     No Known Allergies  Outpatient Medications Prior to Visit  Medication Sig Dispense Refill   diclofenac (VOLTAREN) 50 MG EC tablet TAKE 1 TABLET (50 MG TOTAL) BY MOUTH 2 (TWO) TIMES DAILY. AFTER A MEAL AS NEEDED FOR PAIN 60 tablet 0   dicyclomine (BENTYL) 10 MG capsule TAKE 1 CAPSULE BY MOUTH 2 TIMES DAILY AS NEEDED 60 capsule 2   diphenhydrAMINE (BENADRYL) 25 MG tablet Take 25 mg by mouth daily.     escitalopram (LEXAPRO) 20 MG tablet Take 1 tablet (20 mg total) by mouth daily. 30 tablet 2   fluticasone (FLONASE) 50 MCG/ACT nasal spray PLACE 2 SPRAYS INTO BOTH NOSTRILS DAILY. (Patient taking differently: Place 2 sprays into both nostrils daily as needed.) 16 g 11   gabapentin (NEURONTIN) 300 MG capsule Take 2 capsules (600 mg total) by mouth 2 (two) times daily. 120 capsule 2   metFORMIN (GLUCOPHAGE) 500 MG tablet TAKE 1 TABLET (500 MG TOTAL) BY MOUTH DAILY WITH BREAKFAST. 90 tablet 0   omeprazole (PRILOSEC) 20 MG capsule TAKE 2 CAPSULES (40 MG TOTAL) BY MOUTH DAILY. 60 capsule 2   ondansetron (ZOFRAN-ODT) 8 MG disintegrating tablet Take 1 tablet (8 mg total) by mouth daily as needed for nausea or vomiting. 20 tablet 0   QUEtiapine (SEROQUEL) 25 MG tablet Take 1 tablet (25 mg total) by mouth at bedtime. 30 tablet 2   rosuvastatin (CRESTOR) 20 MG tablet TAKE 1 TABLET (20 MG TOTAL) BY MOUTH DAILY. TO LOWER CHOLESTEROL 30 tablet 2   tiZANidine (ZANAFLEX) 4 MG tablet Take 1 tablet (4 mg total) by mouth 2 (two) times daily as needed for muscle spasms. 40 tablet 0   traMADol (ULTRAM) 50 MG tablet Take 1 tablet (50 mg total) by mouth 3 (three) times daily as needed for pain 20 tablet 0   Digestive Enzymes (PAPAYA AND ENZYMES PO) Take 1 tablet by mouth at bedtime as needed (IBS). (Patient not taking: Reported on 11/20/2020)     polyethylene glycol powder (GLYCOLAX/MIRALAX) 17 GM/SCOOP powder TAKE AS DIRECTED BY PHYSICIAN AS 1 DOSE 476 g 0    tiZANidine (ZANAFLEX) 4 MG tablet START WITH 1/2 TABLET BY MOUTH EVERY 8 HOURS. MAY CAUSE DROWSINEES. INCREASE TO 1 TABLET EVERY 8 HOURS AS NEEDED FOR MUSCLE IF TOLERABLE. (Patient not taking: Reported on 11/20/2020) 90 tablet 0   amLODipine (NORVASC) 5 MG tablet Take 1 tablet (5 mg total) by mouth daily. (Patient not taking: Reported on 11/20/2020) 30 tablet 2   No facility-administered medications prior to visit.     ROS Review of Systems  Constitutional:  Negative for activity change, appetite change and fatigue.  HENT:  Negative for congestion, sinus pressure and sore throat.   Eyes:  Negative for visual disturbance.  Respiratory:  Negative for cough, chest tightness, shortness of breath and wheezing.   Cardiovascular:  Negative for chest pain and palpitations.  Gastrointestinal:  Negative for abdominal distention, abdominal pain and constipation.  Endocrine: Negative for polydipsia.  Genitourinary:  Negative for dysuria and frequency.  Musculoskeletal:        See HPI  Skin:  Negative for rash.  Neurological:  Negative for tremors, light-headedness and numbness.  Hematological:  Does not bruise/bleed easily.  Psychiatric/Behavioral:  Negative for agitation and behavioral problems.    Objective:  BP 131/77   Pulse 72   Ht $R'5\' 9"'Kt$  (1.753 m)   Wt 159 lb (72.1 kg)   LMP 07/04/2017   SpO2 98%   BMI 23.48 kg/m   BP/Weight 11/20/2020 11/18/2020 7/42/5956  Systolic BP 387 564 332  Diastolic BP 77 68 85  Wt. (Lbs) 159 156 156  BMI 23.48 21.76 21.76  Some encounter information is confidential and restricted. Go to Review Flowsheets activity to see all data.      Physical Exam Constitutional:      Appearance: She is well-developed.  Cardiovascular:     Rate and Rhythm: Normal rate.     Heart sounds: Normal heart sounds. No murmur heard. Pulmonary:     Effort: Pulmonary effort is normal.     Breath sounds: Normal breath sounds. No wheezing or rales.  Chest:     Chest wall: No  tenderness.  Abdominal:     General: Bowel sounds are normal. There is no distension.     Palpations: Abdomen is soft. There is no mass.     Tenderness: There is no abdominal tenderness.  Musculoskeletal:        General: Normal range of motion.     Right lower leg: No edema.     Left lower leg: No edema.     Comments: Positive straight leg raise on the left; negative on the right  Neurological:     Mental Status: She is alert and oriented to person, place, and time.  Psychiatric:        Mood and Affect: Mood normal.    CMP Latest Ref Rng & Units 07/30/2020 06/17/2020 05/28/2020  Glucose 70 - 99 mg/dL 92 78 101(H)  BUN 6 - 20 mg/dL $Remove'8 12 17  'hmdcCOk$ Creatinine 0.44 - 1.00 mg/dL 0.76 0.83 1.10(H)  Sodium 135 - 145 mmol/L 139 138 132(L)  Potassium 3.5 - 5.1 mmol/L 4.6 4.1 4.8  Chloride 98 - 111 mmol/L 105 99 93(L)  CO2 22 - 32 mmol/L $RemoveB'26 24 24  'Kddfrfxg$ Calcium 8.9 - 10.3 mg/dL 9.3 9.6 9.1  Total Protein 6.0 - 8.5 g/dL - 7.4 6.6  Total Bilirubin 0.0 - 1.2 mg/dL - 0.3 0.2  Alkaline Phos 44 - 121 IU/L - 60 59  AST 0 - 40 IU/L - 17 17  ALT 0 - 32 IU/L - 22 16    Lipid Panel     Component Value Date/Time   CHOL 147 08/24/2019 0847   TRIG 87 08/24/2019 0847   HDL 69 08/24/2019 0847   CHOLHDL 2.1 08/24/2019 0847   LDLCALC 62 08/24/2019 0847    CBC    Component Value Date/Time   WBC 6.7 07/30/2020 1007   RBC 4.58 07/30/2020 1007   HGB 14.2 07/30/2020 1007   HGB 11.8 05/28/2020  1626   HCT 42.4 07/30/2020 1007   HCT 35.3 05/28/2020 1626   PLT 234 07/30/2020 1007   PLT 257 05/28/2020 1626   MCV 92.6 07/30/2020 1007   MCV 89 05/28/2020 1626   MCH 31.0 07/30/2020 1007   MCHC 33.5 07/30/2020 1007   RDW 12.8 07/30/2020 1007   RDW 12.7 05/28/2020 1626   LYMPHSABS 3.1 05/28/2020 1626   MONOABS 0.5 03/24/2020 1228   EOSABS 0.1 05/28/2020 1626   BASOSABS 0.1 05/28/2020 1626    Lab Results  Component Value Date   HGBA1C 5.9 (H) 05/28/2020    Assessment & Plan:  1. Vasomotor symptoms due to  menopause Uncontrolled She is currently awaiting appointment with GYN  2. Pre-diabetes Last A1c was 5.9 We will check A1c with fasting labs Continue lifestyle modifications to prevent progression to diabetes - CMP14+EGFR; Future - Hemoglobin A1c; Future - Lipid panel; Future  3. Essential hypertension Diet controlled Discontinued amlodipine Counseled on blood pressure goal of less than 130/80, low-sodium, DASH diet, medication compliance, 150 minutes of moderate intensity exercise per week. Discussed medication compliance, adverse effects.   4. Sciatica of left side Uncontrolled Awaiting physical therapy appointment She is currently on gabapentin, tizanidine and tramadol Followed by orthopedics    No orders of the defined types were placed in this encounter.   Follow-up: Return in about 6 months (around 05/23/2021) for medical conditions.       Charlott Rakes, MD, FAAFP. [facility] and [facility] [place], [place]   11/20/2020, 4:35 PM

## 2020-11-20 NOTE — Progress Notes (Signed)
Pt still having pain in back. Wants to get lab work up.

## 2020-11-21 ENCOUNTER — Other Ambulatory Visit: Payer: Self-pay

## 2020-11-24 ENCOUNTER — Other Ambulatory Visit: Payer: Self-pay

## 2020-11-24 MED FILL — Fluticasone Propionate Nasal Susp 50 MCG/ACT: NASAL | 30 days supply | Qty: 16 | Fill #3 | Status: AC

## 2020-11-24 NOTE — Addendum Note (Signed)
Addended bySteffanie Dunn on: 11/24/2020 09:12 AM   Modules accepted: Orders

## 2020-11-25 LAB — CMP14+EGFR
ALT: 15 IU/L (ref 0–32)
AST: 18 IU/L (ref 0–40)
Albumin/Globulin Ratio: 1.9 (ref 1.2–2.2)
Albumin: 4.6 g/dL (ref 3.8–4.8)
Alkaline Phosphatase: 63 IU/L (ref 44–121)
BUN/Creatinine Ratio: 14 (ref 9–23)
BUN: 12 mg/dL (ref 6–24)
Bilirubin Total: 0.4 mg/dL (ref 0.0–1.2)
CO2: 25 mmol/L (ref 20–29)
Calcium: 9.5 mg/dL (ref 8.7–10.2)
Chloride: 102 mmol/L (ref 96–106)
Creatinine, Ser: 0.83 mg/dL (ref 0.57–1.00)
Globulin, Total: 2.4 g/dL (ref 1.5–4.5)
Glucose: 95 mg/dL (ref 65–99)
Potassium: 4 mmol/L (ref 3.5–5.2)
Sodium: 140 mmol/L (ref 134–144)
Total Protein: 7 g/dL (ref 6.0–8.5)
eGFR: 86 mL/min/{1.73_m2} (ref >59)

## 2020-11-25 LAB — LIPID PANEL
Chol/HDL Ratio: 2.1 ratio (ref 0.0–4.4)
Cholesterol, Total: 153 mg/dL (ref 100–199)
HDL: 73 mg/dL (ref >39)
LDL Chol Calc (NIH): 57 mg/dL (ref 0–99)
Triglycerides: 138 mg/dL (ref 0–149)
VLDL Cholesterol Cal: 23 mg/dL (ref 5–40)

## 2020-11-25 LAB — HEMOGLOBIN A1C
Est. average glucose Bld gHb Est-mCnc: 114 mg/dL
Hgb A1c MFr Bld: 5.6 % (ref 4.8–5.6)

## 2020-11-26 ENCOUNTER — Ambulatory Visit: Payer: Self-pay | Attending: Orthopaedic Surgery

## 2020-11-26 ENCOUNTER — Other Ambulatory Visit: Payer: Self-pay

## 2020-11-26 DIAGNOSIS — G8929 Other chronic pain: Secondary | ICD-10-CM | POA: Insufficient documentation

## 2020-11-26 DIAGNOSIS — M545 Low back pain, unspecified: Secondary | ICD-10-CM | POA: Insufficient documentation

## 2020-11-26 DIAGNOSIS — M2569 Stiffness of other specified joint, not elsewhere classified: Secondary | ICD-10-CM | POA: Insufficient documentation

## 2020-11-26 DIAGNOSIS — M5380 Other specified dorsopathies, site unspecified: Secondary | ICD-10-CM | POA: Insufficient documentation
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
## 2020-12-03 ENCOUNTER — Ambulatory Visit: Payer: Self-pay

## 2020-12-04 ENCOUNTER — Other Ambulatory Visit: Payer: Self-pay

## 2020-12-04 ENCOUNTER — Ambulatory Visit: Payer: Self-pay

## 2020-12-04 DIAGNOSIS — G8929 Other chronic pain: Secondary | ICD-10-CM

## 2020-12-04 DIAGNOSIS — M2569 Stiffness of other specified joint, not elsewhere classified: Secondary | ICD-10-CM

## 2020-12-04 DIAGNOSIS — M5380 Other specified dorsopathies, site unspecified: Secondary | ICD-10-CM

## 2020-12-04 DIAGNOSIS — M545 Low back pain, unspecified: Secondary | ICD-10-CM

## 2020-12-04 NOTE — Therapy (Signed)
[place], [place], [address] Phone: [phone]   Fax:  [phone]  Physical Therapy Treatment  Patient Details  Name: Isabel Evans MRN: [id number] Date of Birth: 1970-10-14 Referring Provider (PT): Marybelle Killings, MD   Encounter Date: 12/04/2020   PT End of Session - 12/04/20 1501     Visit Number 2    Number of Visits 13    Date for PT Re-Evaluation 01/17/21    Authorization Type CAFA    Progress Note Due on Visit 10    PT Start Time 1500    PT Stop Time 1543    PT Time Calculation (min) 43 min    Activity Tolerance Patient limited by pain             Past Medical History:  Diagnosis Date   Anemia 11/24/2017   Anxiety    Bone spur    cervical spine   Chronic kidney disease 04/2017   kidney infections   Depression    Deviated nasal septum    states is unable to lie flat, because her nose will become congested and she can stop breathing   Diabetes mellitus without complication ([place])    Dysrhythmia    heart flutters occasionally   Fibromyalgia    Fibromyalgia    GERD (gastroesophageal reflux disease)    Hallux abductovalgus with bunions 09/2014   right    Hammertoe 09/2014   right 4th, 5th   History of stomach ulcers    Hyperlipidemia    Hypertension    states is under control with med., has been on med. x 8 mos.   IBS (irritable bowel syndrome)    no current med.   Osteoarthritis    hands, bilateral hips   Peripheral vascular disease (HCC)    occasional swelling in both legs   Sinus headache     Past Surgical History:  Procedure Laterality Date   ABDOMINAL HYSTERECTOMY     ANTERIOR CERVICAL DECOMP/DISCECTOMY FUSION N/A 07/30/2020   Procedure: C5-6 ANTERIOR CERVICAL DECOMPRESSION/DISCECTOMY FUSION, ALLOGRAFT, PLATE;  Surgeon: Marybelle Killings, MD;  Location: [facility];  Service: Orthopedics;  Laterality: N/A;   BREAST BIOPSY Left 04/2018   benign   BREAST BIOPSY Left 2016    benign   BUNIONECTOMY Right 10/04/2014   Procedure:  Altamese [place], Emmaline Life;  Surgeon: Trula Slade, DPM;  Location: [facility];  Service: Podiatry;  Laterality: Right;   COLONOSCOPY WITH PROPOFOL  02/13/2013   CYSTOSCOPY N/A 07/07/2017   Procedure: CYSTOSCOPY;  Surgeon: Aletha Halim, MD;  Location: [facility];  Service: Gynecology;  Laterality: N/A;   DUPUYTREN / PALMAR FASCIOTOMY Right 07/18/2013   exc. 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   Subjective Assessment - 12/04/20 1502     Subjective Pt reports the only time  she does not notice pain is when she is asleep. In sitting and standing she is constantly changing positions to be comfortable. Pt notes pain to both buttocks, but not extending further down her legs.    Diagnostic tests 03/15/20: Xray- Impression: Lumbar spine x-rays negative for acute changes mild L2-3   degenerative changes. 03/15/18: Disc levels:     L1-2: Negative     L2-3: Right-sided disc protrusion. Subarticular and foraminal  stenosis with impingement of the right L2 and L3 nerve roots. Mild  spinal stenosis. Mild facet degeneration bilaterally.     L3-4: Mild facet degeneration. Negative for disc protrusion or  stenosis     L4-5: Mild disc bulging. Small annular fissure on the right without  focal disc protrusion. Bilateral facet hypertrophy. Mild  subarticular stenosis  bilaterally     L5-S1: Small left foraminal disc protrusion. Bilateral facet  degeneration. Moderate left foraminal encroachment with impingement  of the left L5 nerve root.     IMPRESSION:  Right foraminal disc protrusion at L2-3 with subarticular foraminal  stenosis on the right     Mild subarticular stenosis bilaterally L4-5     Small left foraminal disc protrusion with moderate left foraminal  encroachment at L5-S1.    Currently in Pain? Yes    Pain Score 4     Pain Location Back    Pain Orientation Right;Left;Posterior    Pain Descriptors / Indicators Throbbing;Stabbing    Pain Type Chronic pain    Pain Radiating Towards to butocks    Pain Onset More than a month ago    Pain Frequency Constant                               [facility] Adult PT Treatment/Exercise - 12/04/20 0001       Exercises   Exercises Lumbar      Lumbar Exercises: Stretches   Active Hamstring Stretch Right;Left;2 reps;20 seconds    Single Knee to Chest Stretch Right;Left;2 reps;20 seconds    Lower Trunk Rotation 5 reps;10 seconds    Other Lumbar Stretch Exercise Attempted prone lying which pt was not able to tolerate      Lumbar Exercises: Supine   Clam 5 reps    Clam Limitations c green Tband was attempted, but pt experienced an increase in L hip pain    Bridge 5 reps    Bridge Limitations Attempted, but pt reported a increase in L hip pain                    PT Education - 12/04/20 2022     Education Details HEP    Person(s) Educated Patient    Methods Explanation;Demonstration;Tactile cues;Verbal cues;Handout    Comprehension Verbalized understanding;Returned demonstration;Verbal cues required;Tactile cues required              PT Short Term Goals - 11/27/20 2238       PT SHORT TERM GOAL #1   Title She will be independent with inital HEP    Status New    Target Date 12/18/20      PT SHORT TERM GOAL #2   Title Pt will voice understanding of measures to assist  with pain reduction and management    Status New    Target Date 12/18/20               PT Long Term Goals - 11/27/20 2240  PT LONG TERM GOAL #1   Title She will be independent with a final HEP to mainatained achieved LOF    Status New    Target Date 01/17/21      PT LONG TERM GOAL #2   Title Pt will report a 50% decrease in low back and gluteal pain with ADls and work related activities    Status New    Target Date 01/17/21      PT LONG TERM GOAL #3   Title Pt will demonstrate proper body mechanics for household actvities    Target Date 01/17/21      PT LONG TERM GOAL #4   Title Increarse pt's trunk ROM from mod to minimal limitations to decrease strin on the low back c ADLs and work    Status New    Target Date 11/27/20                   Plan - 12/04/20 1501     Clinical Impression Statement PT was completed for lumbopelvic and hip flexibility and strengthening. Pt was able to tolerate the flexibility exs for her hips and low back, but the strengthening exs attempted increased her L hip pain. Pt was provided a HEP and recommended her complete the flexibility exs 2 to 3 times a day. A written HEP was provided. Pt returned demonstration of the exs. Will attempt strengthening exs in standing the next visit.    Personal Factors and Comorbidities Past/Current Experience;Time since onset of injury/illness/exacerbation;Profession;Comorbidity 2;Age    Comorbidities fibromyalgia, DM, OA    Examination-Activity Limitations Locomotion Level;Sleep;Squat;Bend;Lift;Stand    Examination-Participation Restrictions Occupation    Stability/Clinical Decision Making Evolving/Moderate complexity    Clinical Decision Making Moderate    Rehab Potential Good    PT Frequency 2x / week    PT Duration 6 weeks    PT Treatment/Interventions ADLs/Self Care Home Management;Aquatic Therapy;Electrical Stimulation;Cryotherapy;Iontophoresis '4mg'$ /ml Dexamethasone;Moist Heat;Therapeutic  exercise;Therapeutic activities;Gait training;Stair training;Patient/family education;Manual techniques;Spinal Manipulations;Taping;Dry needling    PT Next Visit Plan Continue directional preferece assessment for treatment    Consulted and Agree with Plan of Care Patient             Patient will benefit from skilled therapeutic intervention in order to improve the following deficits and impairments:  Difficulty walking, Increased muscle spasms, Decreased activity tolerance, Pain, Decreased strength, Impaired flexibility  Visit Diagnosis: Chronic bilateral low back pain, unspecified whether sciatica present  Decreased ROM of trunk and back  Back tightness     Problem List Patient Active Problem List   Diagnosis Date Noted   S/P cervical spinal fusion 09/16/2020   Cervical spinal stenosis 07/30/2020   Other spondylosis with radiculopathy, cervical region 06/18/2020   Major depressive disorder, recurrent episode, moderate ([place]) 05/26/2020   Protrusion of cervical intervertebral disc 03/22/2018   Anemia 11/24/2017   Pre-diabetes 11/24/2017   Blurry vision, bilateral 05/18/2017   Callus of foot 05/18/2017   Pain in joint of left hip 02/16/2017   Bruises easily 11/10/2016   Gastroesophageal reflux disease without esophagitis 09/22/2016   Fatigue associated with anemia 09/22/2016   Flexion contractures 09/22/2016   Generalized anxiety disorder 01/01/2016   Dental abscess 01/01/2016   Healthcare maintenance 05/08/2015   Adjustment disorder with mixed anxiety and depressed mood 05/08/2015   Stress at home 10/24/2014   Irritable bowel syndrome 10/24/2014   Anxiety state 10/24/2014   Preop testing 09/19/2014   Pap smear for cervical cancer screening 09/19/2014   Midline low back pain  without sciatica 08/12/2014   Heberden nodes 08/12/2014   Contracture of joint, hand 08/12/2014   Essential hypertension 02/11/2014   Allergy 02/11/2014   Screening for breast cancer 02/11/2014    Dupuytren's contracture of both hands 11/15/2013   Hallux valgus of right foot 11/15/2013   Back pain 05/21/2013   UTI (urinary tract infection) 05/21/2013   Multiple skin nodules 11/27/2012   Fibromyalgia 09/08/2012   Osteoarthritis 09/08/2012   Neck muscle spasm 07/14/2012   Plantar wart 07/14/2012   Insomnia 07/14/2012    Gar Ponto MS, PT 12/04/20 8:41 PM   [facility] [address] [place], [place], [address] Phone: [phone]   Fax:  [phone]  Name: Isabel Evans MRN: [id number] Date of Birth: [birth year]

## 2020-12-09 ENCOUNTER — Ambulatory Visit: Payer: Self-pay

## 2020-12-09 ENCOUNTER — Other Ambulatory Visit: Payer: Self-pay

## 2020-12-09 DIAGNOSIS — M545 Low back pain, unspecified: Secondary | ICD-10-CM

## 2020-12-09 DIAGNOSIS — M5380 Other specified dorsopathies, site unspecified: Secondary | ICD-10-CM

## 2020-12-09 DIAGNOSIS — G8929 Other chronic pain: Secondary | ICD-10-CM

## 2020-12-09 DIAGNOSIS — M2569 Stiffness of other specified joint, not elsewhere classified: Secondary | ICD-10-CM

## 2020-12-09 NOTE — Therapy (Signed)
[place], [place], [address] Phone: [phone]   Fax:  [phone]  Physical Therapy Treatment  Patient Details  Name: Isabel Evans MRN: [id number] Date of Birth: November 27, 1970 Referring Provider (PT): Marybelle Killings, MD   Encounter Date: 12/09/2020   PT End of Session - 12/09/20 2209     Visit Number 3    Number of Visits 13    Date for PT Re-Evaluation 01/17/21    Authorization Type CAFA    Progress Note Due on Visit 10    PT Start Time 1420    PT Stop Time 1502    PT Time Calculation (min) 42 min    Activity Tolerance Patient limited by pain    Behavior During Therapy Restless             Past Medical History:  Diagnosis Date   Anemia 11/24/2017   Anxiety    Bone spur    cervical spine   Chronic kidney disease 04/2017   kidney infections   Depression    Deviated nasal septum    states is unable to lie flat, because her nose will become congested and she can stop breathing   Diabetes mellitus without complication ([place])    Dysrhythmia    heart flutters occasionally   Fibromyalgia    Fibromyalgia    GERD (gastroesophageal reflux disease)    Hallux abductovalgus with bunions 09/2014   right    Hammertoe 09/2014   right 4th, 5th   History of stomach ulcers    Hyperlipidemia    Hypertension    states is under control with med., has been on med. x 8 mos.   IBS (irritable bowel syndrome)    no current med.   Osteoarthritis    hands, bilateral hips   Peripheral vascular disease (HCC)    occasional swelling in both legs   Sinus headache     Past Surgical History:  Procedure Laterality Date   ABDOMINAL HYSTERECTOMY     ANTERIOR CERVICAL DECOMP/DISCECTOMY FUSION N/A 07/30/2020   Procedure: C5-6 ANTERIOR CERVICAL DECOMPRESSION/DISCECTOMY FUSION, ALLOGRAFT, PLATE;  Surgeon: Marybelle Killings, MD;  Location: [facility];  Service: Orthopedics;  Laterality: N/A;   BREAST BIOPSY Left 04/2018    benign   BREAST BIOPSY Left 2016   benign   BUNIONECTOMY Right 10/04/2014   Procedure:  Altamese [place], Emmaline Life;  Surgeon: Trula Slade, DPM;  Location: [facility];  Service: Podiatry;  Laterality: Right;   COLONOSCOPY WITH PROPOFOL  02/13/2013   CYSTOSCOPY N/A 07/07/2017   Procedure: CYSTOSCOPY;  Surgeon: Aletha Halim, MD;  Location: [facility];  Service: Gynecology;  Laterality: N/A;   DUPUYTREN / PALMAR FASCIOTOMY Right 07/18/2013   exc. 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   small finger   HAMMER TOE SURGERY Right 10/04/2014   Procedure: HAMMER TOE REPAIR 4TH  AND 5TH  RIGHT FOOT  fourth toe fixation with k wire;  Surgeon: Trula Slade, DPM;  Location: [facility];  Service: Podiatry;  Laterality: Right;   LEG SURGERY Left    as a child; near amputation of leg   TUBAL LIGATION     VAGINAL HYSTERECTOMY N/A 07/07/2017   Procedure: HYSTERECTOMY VAGINAL;  Surgeon: Aletha Halim, MD;  Location: [facility];  Service: Gynecology;  Laterality: N/A;    There were no vitals filed for this visit.   Subjective Assessment - 12/09/20 1430  Subjective Good days and bads days, but I've not work for three days so i had better days without pain. Pt reports completing her HEP 2x daily.    Pertinent History fibromyalgia, DM, OA    Diagnostic tests 03/15/20: Xray- Impression: Lumbar spine x-rays negative for acute changes mild L2-3   degenerative changes. 03/15/18: Disc levels:     L1-2: Negative     L2-3: Right-sided disc protrusion. Subarticular and foraminal  stenosis with impingement of the right L2 and L3 nerve roots. Mild  spinal stenosis. Mild facet degeneration bilaterally.     L3-4: Mild facet degeneration. Negative for disc protrusion or  stenosis     L4-5: Mild disc bulging. Small annular fissure on the right without  focal disc protrusion. Bilateral facet hypertrophy. Mild  subarticular stenosis bilaterally      L5-S1: Small left foraminal disc protrusion. Bilateral facet  degeneration. Moderate left foraminal encroachment with impingement  of the left L5 nerve root.     IMPRESSION:  Right foraminal disc protrusion at L2-3 with subarticular foraminal  stenosis on the right     Mild subarticular stenosis bilaterally L4-5     Small left foraminal disc protrusion with moderate left foraminal  encroachment at L5-S1.    Patient Stated Goals To help get rid pain.    Currently in Pain? Yes    Pain Score 4     Pain Location Back    Pain Orientation Right;Left    Pain Descriptors / Indicators Throbbing;Stabbing    Pain Type Chronic pain    Pain Radiating Towards to buttocks    Pain Onset More than a month ago    Pain Frequency Constant    Aggravating Factors  Too much activity    Pain Relieving Factors Medication                               [facility] Adult PT Treatment/Exercise - 12/09/20 0001       Exercises   Exercises Lumbar      Lumbar Exercises: Stretches   Active Hamstring Stretch Right;Left;2 reps;20 seconds    Single Knee to Chest Stretch Right;Left;2 reps;20 seconds    Lower Trunk Rotation 5 reps;10 seconds    Piriformis Stretch Right;Left;2 reps;20 seconds    Other Lumbar Stretch Exercise Open book, 5x, 10 sceen book      Lumbar Exercises: Aerobic   Nustep 8 mins; L4, UEs/LEs      Lumbar Exercises: Standing   Row Both;15 reps    Theraband Level (Row) Level 2 (Red)    Shoulder Extension Both;15 reps    Theraband Level (Shoulder Extension) Level 2 (Red)                    PT Education - 12/09/20 2209     Education Details Updated HEP    Person(s) Educated Patient    Methods Explanation;Demonstration;Tactile cues;Verbal cues;Handout    Comprehension Verbalized understanding;Returned demonstration;Verbal cues required;Tactile cues required              PT Short Term Goals - 11/27/20 2238       PT SHORT TERM GOAL #1   Title She will be  independent with inital HEP    Status New    Target Date 12/18/20      PT SHORT TERM GOAL #2   Title Pt will voice understanding of measures to assist with pain reduction and management  Status New    Target Date 12/18/20               PT Long Term Goals - 11/27/20 2240       PT LONG TERM GOAL #1   Title She will be independent with a final HEP to mainatained achieved LOF    Status New    Target Date 01/17/21      PT LONG TERM GOAL #2   Title Pt will report a 50% decrease in low back and gluteal pain with ADls and work related activities    Status New    Target Date 01/17/21      PT LONG TERM GOAL #3   Title Pt will demonstrate proper body mechanics for household actvities    Target Date 01/17/21      PT LONG TERM GOAL #4   Title Increarse pt's trunk ROM from mod to minimal limitations to decrease strin on the low back c ADLs and work    Status New    Target Date 11/27/20                   Plan - 12/09/20 2210     Clinical Impression Statement Pt particiated in PT thoracolumbar/hip flexility and strengthening. With LTR and UTR exs pt demonstrates decreased mobility. The last visit, pt was not able to tolerate trunk strengthening exs in supine, but today in standing she was able to tolerate strengthening/stability exs. Pt tolerated today's session without adverse effects.    Personal Factors and Comorbidities Past/Current Experience;Time since onset of injury/illness/exacerbation;Profession;Comorbidity 2;Age    Comorbidities fibromyalgia, DM, OA    Examination-Activity Limitations Locomotion Level;Sleep;Squat;Bend;Lift;Stand    Examination-Participation Restrictions Occupation    Stability/Clinical Decision Making Evolving/Moderate complexity    Clinical Decision Making Moderate    Rehab Potential Good    PT Frequency 2x / week    PT Duration 6 weeks    PT Treatment/Interventions ADLs/Self Care Home Management;Aquatic Therapy;Electrical  Stimulation;Cryotherapy;Iontophoresis '4mg'$ /ml Dexamethasone;Moist Heat;Therapeutic exercise;Therapeutic activities;Gait training;Stair training;Patient/family education;Manual techniques;Spinal Manipulations;Taping;Dry needling    PT Next Visit Plan Initiate core strengthening and lateral shft lumbar exs    PT Home Exercise Plan [id number]    Consulted and Agree with Plan of Care Patient             Patient will benefit from skilled therapeutic intervention in order to improve the following deficits and impairments:  Difficulty walking, Increased muscle spasms, Decreased activity tolerance, Pain, Decreased strength, Impaired flexibility  Visit Diagnosis: Chronic bilateral low back pain, unspecified whether sciatica present  Decreased ROM of trunk and back  Back tightness     Problem List Patient Active Problem List   Diagnosis Date Noted   S/P cervical spinal fusion 09/16/2020   Cervical spinal stenosis 07/30/2020   Other spondylosis with radiculopathy, cervical region 06/18/2020   Major depressive disorder, recurrent episode, moderate ([place]) 05/26/2020   Protrusion of cervical intervertebral disc 03/22/2018   Anemia 11/24/2017   Pre-diabetes 11/24/2017   Blurry vision, bilateral 05/18/2017   Callus of foot 05/18/2017   Pain in joint of left hip 02/16/2017   Bruises easily 11/10/2016   Gastroesophageal reflux disease without esophagitis 09/22/2016   Fatigue associated with anemia 09/22/2016   Flexion contractures 09/22/2016   Generalized anxiety disorder 01/01/2016   Dental abscess 01/01/2016   Healthcare maintenance 05/08/2015   Adjustment disorder with mixed anxiety and depressed mood 05/08/2015   Stress at home 10/24/2014   Irritable bowel syndrome 10/24/2014   Anxiety  state 10/24/2014   Preop testing 09/19/2014   Pap smear for cervical cancer screening 09/19/2014   Midline low back pain without sciatica 08/12/2014   Heberden nodes 08/12/2014   Contracture of joint,  hand 08/12/2014   Essential hypertension 02/11/2014   Allergy 02/11/2014   Screening for breast cancer 02/11/2014   Dupuytren's contracture of both hands 11/15/2013   Hallux valgus of right foot 11/15/2013   Back pain 05/21/2013   UTI (urinary tract infection) 05/21/2013   Multiple skin nodules 11/27/2012   Fibromyalgia 09/08/2012   Osteoarthritis 09/08/2012   Neck muscle spasm 07/14/2012   Plantar wart 07/14/2012   Insomnia 07/14/2012    Gar Ponto MS, PT 12/09/20 10:22 PM   [facility] [address] [place], [place], [address] Phone: [phone]   Fax:  [phone]  Name: Isabel Evans MRN: [id number] Date of Birth: [birth year]

## 2020-12-11 ENCOUNTER — Ambulatory Visit: Payer: Self-pay

## 2020-12-11 ENCOUNTER — Other Ambulatory Visit: Payer: Self-pay

## 2020-12-12 ENCOUNTER — Other Ambulatory Visit: Payer: Self-pay

## 2020-12-12 ENCOUNTER — Telehealth: Payer: Self-pay

## 2020-12-12 NOTE — Telephone Encounter (Signed)
Spoke with pt. Pt cancelled 12/11/20 appt at the front desk when leaving from her last appt and it was not recorded. Pt cancelled due to dental work yesterday. Pr was reminded of her upcoming appt on 12/16/20.

## 2020-12-16 ENCOUNTER — Ambulatory Visit: Payer: Self-pay | Admitting: Physical Therapy

## 2020-12-16 ENCOUNTER — Other Ambulatory Visit: Payer: Self-pay

## 2020-12-18 ENCOUNTER — Ambulatory Visit: Payer: Self-pay | Attending: Orthopaedic Surgery

## 2020-12-18 ENCOUNTER — Other Ambulatory Visit: Payer: Self-pay

## 2020-12-18 DIAGNOSIS — M5380 Other specified dorsopathies, site unspecified: Secondary | ICD-10-CM | POA: Insufficient documentation

## 2020-12-18 DIAGNOSIS — M2569 Stiffness of other specified joint, not elsewhere classified: Secondary | ICD-10-CM | POA: Insufficient documentation

## 2020-12-18 DIAGNOSIS — G8929 Other chronic pain: Secondary | ICD-10-CM | POA: Insufficient documentation

## 2020-12-18 DIAGNOSIS — M545 Low back pain, unspecified: Secondary | ICD-10-CM | POA: Insufficient documentation

## 2020-12-18 NOTE — Therapy (Signed)
[place], [place], [address] Phone: [phone]   Fax:  [phone]  Physical Therapy Treatment  Patient Details  Name: Isabel Evans MRN: [id number] Date of Birth: [birth year] Referring Provider (PT): Marybelle Killings, MD   Encounter Date: 12/18/2020   PT End of Session - 12/18/20 1544     Visit Number 4    Number of Visits 13    Date for PT Re-Evaluation 01/17/21    Authorization Type CAFA    Progress Note Due on Visit 10    PT Start Time 1502    PT Stop Time [id number]    PT Time Calculation (min) 42 min    Activity Tolerance Patient limited by pain    Behavior During Therapy Restless             Past Medical History:  Diagnosis Date   Anemia 11/24/2017   Anxiety    Bone spur    cervical spine   Chronic kidney disease 04/2017   kidney infections   Depression    Deviated nasal septum    states is unable to lie flat, because her nose will become congested and she can stop breathing   Diabetes mellitus without complication ([place])    Dysrhythmia    heart flutters occasionally   Fibromyalgia    Fibromyalgia    GERD (gastroesophageal reflux disease)    Hallux abductovalgus with bunions 09/2014   right    Hammertoe 09/2014   right 4th, 5th   History of stomach ulcers    Hyperlipidemia    Hypertension    states is under control with med., has been on med. x 8 mos.   IBS (irritable bowel syndrome)    no current med.   Osteoarthritis    hands, bilateral hips   Peripheral vascular disease (HCC)    occasional swelling in both legs   Sinus headache     Past Surgical History:  Procedure Laterality Date   ABDOMINAL HYSTERECTOMY     ANTERIOR CERVICAL DECOMP/DISCECTOMY FUSION N/A 07/30/2020   Procedure: C5-6 ANTERIOR CERVICAL DECOMPRESSION/DISCECTOMY FUSION, ALLOGRAFT, PLATE;  Surgeon: Marybelle Killings, MD;  Location: [facility];  Service: Orthopedics;  Laterality: N/A;   BREAST BIOPSY Left 04/2018    benign   BREAST BIOPSY Left 2016   benign   BUNIONECTOMY Right 10/04/2014   Procedure:  Altamese [place], Emmaline Life;  Surgeon: Trula Slade, DPM;  Location: [facility];  Service: Podiatry;  Laterality: Right;   COLONOSCOPY WITH PROPOFOL  02/13/2013   CYSTOSCOPY N/A 07/07/2017   Procedure: CYSTOSCOPY;  Surgeon: Aletha Halim, MD;  Location: [facility];  Service: Gynecology;  Laterality: N/A;   DUPUYTREN / PALMAR FASCIOTOMY Right 07/18/2013   exc. 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   small finger   HAMMER TOE SURGERY Right 10/04/2014   Procedure: HAMMER TOE REPAIR 4TH  AND 5TH  RIGHT FOOT  fourth toe fixation with k wire;  Surgeon: Trula Slade, DPM;  Location: [facility];  Service: Podiatry;  Laterality: Right;   LEG SURGERY Left    as a child; near amputation of leg   TUBAL LIGATION     VAGINAL HYSTERECTOMY N/A 07/07/2017   Procedure: HYSTERECTOMY VAGINAL;  Surgeon: Aletha Halim, MD;  Location: [facility];  Service: Gynecology;  Laterality: N/A;    There were no vitals filed for this visit.   Subjective Assessment - 12/18/20 1510  Subjective Pt reports she is hurting more. "I hurt from my neck to my buttock". Doing some degree of exercises help, but if I do to much I hurt more. "i'm not sure if I'll be able to continue working like this." Pt notes she has run out of tramadol.    Diagnostic tests 03/15/20: Xray- Impression: Lumbar spine x-rays negative for acute changes mild L2-3   degenerative changes. 03/15/18: Disc levels:     L1-2: Negative     L2-3: Right-sided disc protrusion. Subarticular and foraminal  stenosis with impingement of the right L2 and L3 nerve roots. Mild  spinal stenosis. Mild facet degeneration bilaterally.     L3-4: Mild facet degeneration. Negative for disc protrusion or  stenosis     L4-5: Mild disc bulging. Small annular fissure on the right without  focal disc protrusion. Bilateral  facet hypertrophy. Mild  subarticular stenosis bilaterally     L5-S1: Small left foraminal disc protrusion. Bilateral facet  degeneration. Moderate left foraminal encroachment with impingement  of the left L5 nerve root.     IMPRESSION:  Right foraminal disc protrusion at L2-3 with subarticular foraminal  stenosis on the right     Mild subarticular stenosis bilaterally L4-5     Small left foraminal disc protrusion with moderate left foraminal  encroachment at L5-S1.    Patient Stated Goals To help get rid pain.    Currently in Pain? Yes    Pain Score 6     Pain Location Back    Pain Orientation Right;Left;Posterior    Pain Descriptors / Indicators Throbbing;Stabbing;Spasm    Pain Type Chronic pain    Pain Onset More than a month ago    Pain Frequency Constant    Aggravating Factors  Too much activity                               [facility] Adult PT Treatment/Exercise - 12/18/20 0001       Exercises   Exercises Lumbar      Lumbar Exercises: Stretches   Single Knee to Chest Stretch Right;Left;20 seconds;3 reps    Other Lumbar Stretch Exercise Childs pose c theraball, forwar      Lumbar Exercises: Aerobic   Nustep 6 mins; L5, UEs/LEs      Lumbar Exercises: Supine   Pelvic Tilt 10 reps   Pt developed back spasms and the ex was discontinued   Clam 10 reps    Clam Limitations c green Tband was attempted, but pt experienced an increase in L hip pain      Modalities   Modalities Cryotherapy      Cryotherapy   Number Minutes Cryotherapy 10 Minutes    Cryotherapy Location Lumbar Spine    Type of Cryotherapy Ice pack                      PT Short Term Goals - 12/18/20 1709       PT SHORT TERM GOAL #1   Title She will be independent with inital HEP    Status On-going    Target Date 12/18/20      PT SHORT TERM GOAL #2   Title Pt will voice understanding of measures to assist with pain reduction and management    Status On-going    Target Date  12/18/20               PT Long Term Goals -  11/27/20 2240       PT LONG TERM GOAL #1   Title She will be independent with a final HEP to mainatained achieved LOF    Status New    Target Date 01/17/21      PT LONG TERM GOAL #2   Title Pt will report a 50% decrease in low back and gluteal pain with ADls and work related activities    Status New    Target Date 01/17/21      PT LONG TERM GOAL #3   Title Pt will demonstrate proper body mechanics for household actvities    Target Date 01/17/21      PT LONG TERM GOAL #4   Title Increarse pt's trunk ROM from mod to minimal limitations to decrease strin on the low back c ADLs and work    Status New    Target Date 11/27/20                   Plan - 12/18/20 1707     Clinical Impression Statement PT was completed in an attempt to improve pt's trunk/LE flexibility and strength. Pt reports experiencing significant pain including muscle spasms with all exs. Attempts to complete ther ex was discontinued and a cold pack was applied to to assist in pain management. After the cold pack, pt reports her pain decreased to the baseline pain level she started with PT today, 6/10.    Personal Factors and Comorbidities Past/Current Experience;Time since onset of injury/illness/exacerbation;Profession;Comorbidity 2;Age    Comorbidities fibromyalgia, DM, OA    Examination-Activity Limitations Locomotion Level;Sleep;Squat;Bend;Lift;Stand    Examination-Participation Restrictions Occupation    Stability/Clinical Decision Making Evolving/Moderate complexity    Clinical Decision Making Moderate    Rehab Potential Good    PT Frequency 2x / week    PT Duration 6 weeks    PT Treatment/Interventions ADLs/Self Care Home Management;Aquatic Therapy;Electrical Stimulation;Cryotherapy;Iontophoresis '4mg'$ /ml Dexamethasone;Moist Heat;Therapeutic exercise;Therapeutic activities;Gait training;Stair training;Patient/family education;Manual techniques;Spinal  Manipulations;Taping;Dry needling    PT Next Visit Plan Initiate core strengthening and lateral shft lumbar exs    PT Home Exercise Plan [id number]    Consulted and Agree with Plan of Care Patient             Patient will benefit from skilled therapeutic intervention in order to improve the following deficits and impairments:  Difficulty walking, Increased muscle spasms, Decreased activity tolerance, Pain, Decreased strength, Impaired flexibility  Visit Diagnosis: Chronic bilateral low back pain, unspecified whether sciatica present  Decreased ROM of trunk and back  Back tightness     Problem List Patient Active Problem List   Diagnosis Date Noted   S/P cervical spinal fusion 09/16/2020   Cervical spinal stenosis 07/30/2020   Other spondylosis with radiculopathy, cervical region 06/18/2020   Major depressive disorder, recurrent episode, moderate ([place]) 05/26/2020   Protrusion of cervical intervertebral disc 03/22/2018   Anemia 11/24/2017   Pre-diabetes 11/24/2017   Blurry vision, bilateral 05/18/2017   Callus of foot 05/18/2017   Pain in joint of left hip 02/16/2017   Bruises easily 11/10/2016   Gastroesophageal reflux disease without esophagitis 09/22/2016   Fatigue associated with anemia 09/22/2016   Flexion contractures 09/22/2016   Generalized anxiety disorder 01/01/2016   Dental abscess 01/01/2016   Healthcare maintenance 05/08/2015   Adjustment disorder with mixed anxiety and depressed mood 05/08/2015   Stress at home 10/24/2014   Irritable bowel syndrome 10/24/2014   Anxiety state 10/24/2014   Preop testing 09/19/2014   Pap smear for cervical  cancer screening 09/19/2014   Midline low back pain without sciatica 08/12/2014   Heberden nodes 08/12/2014   Contracture of joint, hand 08/12/2014   Essential hypertension 02/11/2014   Allergy 02/11/2014   Screening for breast cancer 02/11/2014   Dupuytren's contracture of both hands 11/15/2013   Hallux valgus of  right foot 11/15/2013   Back pain 05/21/2013   UTI (urinary tract infection) 05/21/2013   Multiple skin nodules 11/27/2012   Fibromyalgia 09/08/2012   Osteoarthritis 09/08/2012   Neck muscle spasm 07/14/2012   Plantar wart 07/14/2012   Insomnia 07/14/2012    Gar Ponto MS, PT 12/18/20 5:24 PM   [facility] [address] [place], [place], [address] Phone: [phone]   Fax:  [phone]  Name: Zarie Lax MRN: [id number] Date of Birth: 08-Aug-1970

## 2020-12-19 ENCOUNTER — Other Ambulatory Visit: Payer: Self-pay | Admitting: Physician Assistant

## 2020-12-19 ENCOUNTER — Other Ambulatory Visit: Payer: Self-pay

## 2020-12-23 ENCOUNTER — Other Ambulatory Visit: Payer: Self-pay | Admitting: Family Medicine

## 2020-12-23 ENCOUNTER — Other Ambulatory Visit: Payer: Self-pay

## 2020-12-23 ENCOUNTER — Other Ambulatory Visit: Payer: Self-pay | Admitting: Orthopaedic Surgery

## 2020-12-23 ENCOUNTER — Other Ambulatory Visit (HOSPITAL_COMMUNITY): Payer: Self-pay

## 2020-12-23 ENCOUNTER — Other Ambulatory Visit: Payer: Self-pay | Admitting: Physician Assistant

## 2020-12-23 ENCOUNTER — Ambulatory Visit: Payer: Self-pay

## 2020-12-23 ENCOUNTER — Other Ambulatory Visit (HOSPITAL_BASED_OUTPATIENT_CLINIC_OR_DEPARTMENT_OTHER): Payer: Self-pay

## 2020-12-23 DIAGNOSIS — M545 Low back pain, unspecified: Secondary | ICD-10-CM

## 2020-12-23 DIAGNOSIS — M542 Cervicalgia: Secondary | ICD-10-CM

## 2020-12-23 DIAGNOSIS — M2550 Pain in unspecified joint: Secondary | ICD-10-CM

## 2020-12-23 DIAGNOSIS — R110 Nausea: Secondary | ICD-10-CM

## 2020-12-23 MED ORDER — TIZANIDINE HCL 4 MG PO TABS
4.0000 mg | ORAL_TABLET | Freq: Two times a day (BID) | ORAL | 0 refills | Status: DC | PRN
  Filled 2020-12-23 – 2021-01-20 (×3): qty 40, 20d supply, fill #0

## 2020-12-23 MED FILL — Fluticasone Propionate Nasal Susp 50 MCG/ACT: NASAL | 30 days supply | Qty: 16 | Fill #4 | Status: AC

## 2020-12-23 NOTE — Telephone Encounter (Signed)
Can you please advise since Dr. Lorin Mercy is out of the office? Patient is status post cervical fusion in April. She is also attending PT for low back pain.

## 2020-12-24 ENCOUNTER — Other Ambulatory Visit (HOSPITAL_COMMUNITY): Payer: Self-pay

## 2020-12-24 ENCOUNTER — Other Ambulatory Visit: Payer: Self-pay

## 2020-12-24 ENCOUNTER — Other Ambulatory Visit: Payer: Self-pay | Admitting: Physician Assistant

## 2020-12-24 MED ORDER — DICLOFENAC SODIUM 50 MG PO TBEC
DELAYED_RELEASE_TABLET | ORAL | 0 refills | Status: DC
  Filled 2020-12-24: qty 60, 30d supply, fill #0

## 2020-12-24 NOTE — Telephone Encounter (Signed)
Requested medication (s) are due for refill today:   Requested medication (s) are on the active medication list: Yes  Last refill:  Zanaflex  chart shows 08/13/20  Protocol shows 12/23/20  Future visit scheduled: No  Notes to clinic:  See requests.    Requested Prescriptions  Pending Prescriptions Disp Refills   tiZANidine (ZANAFLEX) 4 MG tablet 90 tablet 0    Sig: START WITH 1/2 TABLET BY MOUTH EVERY 8 HOURS. MAY CAUSE DROWSINEES. INCREASE TO 1 TABLET EVERY 8 HOURS AS NEEDED FOR MUSCLE IF TOLERABLE.     Not Delegated - Cardiovascular:  Alpha-2 Agonists - tizanidine Failed - 12/23/2020 10:39 AM      Failed - This refill cannot be delegated      Passed - Valid encounter within last 6 months    Recent Outpatient Visits           1 month ago Vasomotor symptoms due to menopause   [place], Enobong, MD   2 months ago UTI symptoms   [place], [place] W, NP   6 months ago Other spondylosis with radiculopathy, cervical region   [place], Enobong, MD   7 months ago Smoking   [place], [place], [place]   9 months ago [place], [place], [place]       Future Appointments             In 1 week Leandrew Koyanagi, MD [facility]   In 5 months Charlott Rakes, MD [place]             ondansetron (ZOFRAN-ODT) 8 MG disintegrating tablet 20 tablet 0    Sig: Take 1 tablet (8 mg total) by mouth daily as needed for nausea or vomiting.     Not Delegated - Gastroenterology: Antiemetics Failed - 12/23/2020 10:39 AM      Failed - This refill cannot be delegated      Passed - Valid encounter within last 6 months    Recent Outpatient Visits           1 month ago Vasomotor symptoms due to menopause   [place], Enobong, MD   2 months ago UTI symptoms   [place], [place] W, NP   6 months ago Other spondylosis with radiculopathy, cervical region   [place], Enobong, MD   7 months ago Smoking   [place], [place], [place]   9 months ago [place], Dionne [place], [place]       Future Appointments             In 1 week Leandrew Koyanagi, MD [place]   In 5 months Charlott Rakes, MD [place]            Signed Prescriptions Disp Refills   diclofenac (VOLTAREN) 50 MG EC tablet 60 tablet 0    Sig: TAKE 1 TABLET (50 MG TOTAL) BY MOUTH 2 (TWO) TIMES DAILY. AFTER A MEAL AS NEEDED FOR PAIN     Analgesics:  NSAIDS Passed - 12/23/2020 10:39 AM      Passed -  Cr in normal range and within 360 days    Creat  Date Value Ref Range Status  11/27/2015 0.75 0.50 - 1.10 mg/dL Final   Creatinine, Ser  Date Value Ref Range Status  11/24/2020 0.83 0.57 - 1.00 mg/dL Final          Passed - HGB in normal range and within 360 days    Hemoglobin  Date Value Ref Range Status  07/30/2020 14.2 12.0 - 15.0 g/dL Final  05/28/2020 11.8 11.1 - 15.9 g/dL Final          Passed - Patient is not pregnant      Passed - Valid encounter within last 12 months    Recent Outpatient Visits           1 month ago Vasomotor symptoms due to menopause   [place], Enobong, MD   2 months ago UTI symptoms   [place], [place] W, NP   6 months ago Other spondylosis with radiculopathy, cervical region   [place], Enobong, MD   7 months ago Smoking   [place], [place], [place]   9 months ago [place], [place]       Future Appointments             In 1 week Erlinda Hong, Marylynn Pearson, MD [place]   In 5 months Charlott Rakes, MD [place]

## 2020-12-25 ENCOUNTER — Other Ambulatory Visit: Payer: Self-pay

## 2020-12-25 ENCOUNTER — Ambulatory Visit: Payer: Self-pay

## 2020-12-25 ENCOUNTER — Other Ambulatory Visit: Payer: Self-pay | Admitting: Physician Assistant

## 2020-12-25 ENCOUNTER — Other Ambulatory Visit (HOSPITAL_COMMUNITY): Payer: Self-pay

## 2020-12-25 DIAGNOSIS — M5380 Other specified dorsopathies, site unspecified: Secondary | ICD-10-CM

## 2020-12-25 DIAGNOSIS — M545 Low back pain, unspecified: Secondary | ICD-10-CM

## 2020-12-25 DIAGNOSIS — M2569 Stiffness of other specified joint, not elsewhere classified: Secondary | ICD-10-CM

## 2020-12-25 DIAGNOSIS — G8929 Other chronic pain: Secondary | ICD-10-CM

## 2020-12-25 MED ORDER — TIZANIDINE HCL 4 MG PO TABS
ORAL_TABLET | ORAL | 0 refills | Status: DC
Start: 2020-12-25 — End: 2021-02-19
  Filled 2020-12-25: qty 90, 30d supply, fill #0

## 2020-12-25 MED ORDER — ONDANSETRON 8 MG PO TBDP
8.0000 mg | ORAL_TABLET | Freq: Every day | ORAL | 0 refills | Status: DC | PRN
  Filled 2020-12-25: qty 20, 20d supply, fill #0

## 2020-12-26 ENCOUNTER — Other Ambulatory Visit (HOSPITAL_COMMUNITY): Payer: Self-pay

## 2020-12-26 ENCOUNTER — Other Ambulatory Visit: Payer: Self-pay | Admitting: Physician Assistant

## 2020-12-26 NOTE — Therapy (Signed)
[place], [place], [address] Phone: [phone]   Fax:  [phone]  Physical Therapy Treatment/Discharge  Patient Details  Name: Isabel Evans MRN: [id number] Date of Birth: November 22, 1970 Referring Provider (PT): Marybelle Killings, MD   Encounter Date: 12/25/2020   PT End of Session - 12/25/20 1508     Visit Number 5    Number of Visits 13    Date for PT Re-Evaluation 01/17/21    Authorization Type CAFA    Progress Note Due on Visit 10    PT Start Time 1508    PT Stop Time 1547    PT Time Calculation (min) 39 min    Activity Tolerance Patient limited by pain    Behavior During Therapy Restless             Past Medical History:  Diagnosis Date   Anemia 11/24/2017   Anxiety    Bone spur    cervical spine   Chronic kidney disease 04/2017   kidney infections   Depression    Deviated nasal septum    states is unable to lie flat, because her nose will become congested and she can stop breathing   Diabetes mellitus without complication ([place])    Dysrhythmia    heart flutters occasionally   Fibromyalgia    Fibromyalgia    GERD (gastroesophageal reflux disease)    Hallux abductovalgus with bunions 09/2014   right    Hammertoe 09/2014   right 4th, 5th   History of stomach ulcers    Hyperlipidemia    Hypertension    states is under control with med., has been on med. x 8 mos.   IBS (irritable bowel syndrome)    no current med.   Osteoarthritis    hands, bilateral hips   Peripheral vascular disease (HCC)    occasional swelling in both legs   Sinus headache     Past Surgical History:  Procedure Laterality Date   ABDOMINAL HYSTERECTOMY     ANTERIOR CERVICAL DECOMP/DISCECTOMY FUSION N/A 07/30/2020   Procedure: C5-6 ANTERIOR CERVICAL DECOMPRESSION/DISCECTOMY FUSION, ALLOGRAFT, PLATE;  Surgeon: Marybelle Killings, MD;  Location: [facility];  Service: Orthopedics;  Laterality: N/A;   BREAST BIOPSY Left  04/2018   benign   BREAST BIOPSY Left 2016   benign   BUNIONECTOMY Right 10/04/2014   Procedure:  [place], Emmaline Life;  Surgeon: Trula Slade, DPM;  Location: [facility];  Service: Podiatry;  Laterality: Right;   COLONOSCOPY WITH PROPOFOL  02/13/2013   CYSTOSCOPY N/A 07/07/2017   Procedure: CYSTOSCOPY;  Surgeon: Aletha Halim, MD;  Location: [facility];  Service: Gynecology;  Laterality: N/A;   DUPUYTREN / PALMAR FASCIOTOMY Right 07/18/2013   exc. 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   small finger   HAMMER TOE SURGERY Right 10/04/2014   Procedure: HAMMER TOE REPAIR 4TH  AND 5TH  RIGHT FOOT  fourth toe fixation with k wire;  Surgeon: Trula Slade, DPM;  Location: [facility];  Service: Podiatry;  Laterality: Right;   LEG SURGERY Left    as a child; near amputation of leg   TUBAL LIGATION     VAGINAL HYSTERECTOMY N/A 07/07/2017   Procedure: HYSTERECTOMY VAGINAL;  Surgeon: Aletha Halim, MD;  Location: [facility];  Service: Gynecology;  Laterality: N/A;    There were no vitals filed for this visit.   Subjective Assessment - 12/25/20 1515  Subjective Pt reports her pain in continuing to be significant from her neck to her buttock and out to her shoulders. Pt states she is not going to continue with PT and is going to schedule an appt with Dr. Lorin Mercy. 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 03/15/18: Disc levels:     L1-2: Negative     L2-3: Right-sided disc protrusion. Subarticular and foraminal  stenosis with impingement of the right L2 and L3 nerve roots. Mild  spinal stenosis. Mild facet degeneration bilaterally.     L3-4: Mild facet degeneration. Negative for disc  protrusion or  stenosis     L4-5: Mild disc bulging. Small annular fissure on the right without  focal disc protrusion. Bilateral facet hypertrophy. Mild  subarticular stenosis bilaterally     L5-S1: Small left foraminal disc protrusion. Bilateral facet  degeneration. Moderate left foraminal encroachment with impingement  of the left L5 nerve root.     IMPRESSION:  Right foraminal disc protrusion at L2-3 with subarticular foraminal  stenosis on the right     Mild subarticular stenosis bilaterally L4-5     Small left foraminal disc protrusion with moderate left foraminal  encroachment at L5-S1.    Patient Stated Goals To help get rid pain.    Currently in Pain? Yes    Pain Score 6     Pain Location Back    Pain Orientation Right;Left    Pain Descriptors / Indicators Throbbing;Stabbing;Spasm    Pain Type Chronic pain    Pain Radiating Towards to buttocks    Pain Onset More than a month ago    Pain Frequency Constant    Aggravating Factors  Too much activity    Pain Relieving Factors medication, sometimes rest, heat or cold                               [facility] Adult PT Treatment/Exercise - 12/26/20 0001       Lumbar Exercises: Aerobic   Nustep 6 mins; L5, UEs/LEs                       PT Short Term Goals - 12/18/20 1709       PT SHORT TERM GOAL #1   Title She will be independent with inital HEP    Status On-going    Target Date 12/18/20      PT SHORT TERM GOAL #2   Title Pt will voice understanding of measures to assist with pain reduction and management    Status On-going    Target Date 12/18/20               PT Long Term Goals - 12/26/20 1006       PT LONG TERM GOAL #1   Title She will be independent with a final HEP to mainatained achieved LOF. 12/25/20: Ind, but pt is not tolerating the exs    Status Achieved    Target Date 12/25/20      PT LONG TERM GOAL #2   Title Pt will report a 50% decrease in low back and gluteal pain with ADls  and work related activities    Status Not  Met    Target Date 12/25/20      PT LONG TERM GOAL #3   Title Pt will demonstrate proper body mechanics for household actvities    Status Deferred      PT LONG TERM GOAL #4   Title Increase pt's trunk ROM from mod to minimal limitations to decrease strain on the low back c ADLs and work    Status Achieved    Target Date 12/25/20                   Plan - 12/25/20 1509     Clinical Impression Statement Pt stated her back pain and spasms are getting wrose so she is stopping PT at this time and is going to schedule an appt at with her MD at [facility]. Pt notes she is not able to tolerate the flexibility and strengthening exs, and her ability to tolerate work is declining aswell. Pt's FOTO functional score has declined for 47% to 42%. Pt is DCed from PT at this time with her planning to see her MD about other care options.    Personal Factors and Comorbidities Past/Current Experience;Time since onset of injury/illness/exacerbation;Profession;Comorbidity 2    Comorbidities fibromyalgia, DM, OA    Examination-Activity Limitations Locomotion Level;Sleep;Squat;Bend;Lift;Stand    Examination-Participation Restrictions Occupation    Stability/Clinical Decision Making Evolving/Moderate complexity    Clinical Decision Making Moderate    Rehab Potential Good    PT Frequency 2x / week    PT Duration 6 weeks    PT Treatment/Interventions ADLs/Self Care Home Management;Aquatic Therapy;Electrical Stimulation;Cryotherapy;Iontophoresis 24m/ml Dexamethasone;Moist Heat;Therapeutic exercise;Therapeutic activities;Gait training;Stair training;Patient/family education;Manual techniques;Spinal Manipulations;Taping;Dry needling    PT Next Visit Plan Initiate core strengthening and lateral shft lumbar exs    PT Home Exercise Plan [email or web address]    Consulted and Agree with Plan of Care Patient             Patient will benefit from skilled therapeutic  intervention in order to improve the following deficits and impairments:  Difficulty walking, Increased muscle spasms, Decreased activity tolerance, Pain, Decreased strength, Impaired flexibility  Visit Diagnosis: Chronic bilateral low back pain, unspecified whether sciatica present  Back tightness  Decreased ROM of trunk and back     Problem List Patient Active Problem List   Diagnosis Date Noted   S/P cervical spinal fusion 09/16/2020   Cervical spinal stenosis 07/30/2020   Other spondylosis with radiculopathy, cervical region 06/18/2020   Major depressive disorder, recurrent episode, moderate ([facility] 05/26/2020   Protrusion of cervical intervertebral disc 03/22/2018   Anemia 11/24/2017   Pre-diabetes 11/24/2017   Blurry vision, bilateral 05/18/2017   Callus of foot 05/18/2017   Pain in joint of left hip 02/16/2017   Bruises easily 11/10/2016   Gastroesophageal reflux disease without esophagitis 09/22/2016   Fatigue associated with anemia 09/22/2016   Flexion contractures 09/22/2016   Generalized anxiety disorder 01/01/2016   Dental abscess 01/01/2016   Healthcare maintenance 05/08/2015   Adjustment disorder with mixed anxiety and depressed mood 05/08/2015   Stress at home 10/24/2014   Irritable bowel syndrome 10/24/2014   Anxiety state 10/24/2014   Preop testing 09/19/2014   Pap smear for cervical cancer screening 09/19/2014   Midline low back pain without sciatica 08/12/2014   [place] nodes 08/12/2014   Contracture of joint, hand 08/12/2014   Essential hypertension 02/11/2014   Allergy 02/11/2014   Screening for breast cancer 02/11/2014   Dupuytren's contracture of both hands 11/15/2013   Hallux valgus of right foot  11/15/2013   Back pain 05/21/2013   UTI (urinary tract infection) 05/21/2013   Multiple skin nodules 11/27/2012   Fibromyalgia 09/08/2012   Osteoarthritis 09/08/2012   Neck muscle spasm 07/14/2012   Plantar wart 07/14/2012   Insomnia 07/14/2012     Gar Ponto, PT 12/26/2020, 1:25 PM  PHYSICAL THERAPY DISCHARGE SUMMARY  Visits from Start of Care: 5  Current functional level related to goals / functional outcomes: See above   Remaining deficits: See above   Education / Equipment: HEP   Patient agrees to discharge. Patient goals were partially met. Patient is being discharged due to did not respond to therapy.    [place], [place], [address] Phone: [phone]   Fax:  [phone]  Name: Laveta Gilkey MRN: [id number] Date of Birth: March 17, 1971

## 2020-12-29 ENCOUNTER — Other Ambulatory Visit: Payer: Self-pay

## 2020-12-29 DIAGNOSIS — Z1231 Encounter for screening mammogram for malignant neoplasm of breast: Secondary | ICD-10-CM

## 2020-12-30 ENCOUNTER — Other Ambulatory Visit (HOSPITAL_COMMUNITY): Payer: Self-pay

## 2020-12-30 ENCOUNTER — Ambulatory Visit: Payer: Self-pay | Admitting: Physical Therapy

## 2020-12-30 ENCOUNTER — Other Ambulatory Visit: Payer: Self-pay | Admitting: Physician Assistant

## 2021-01-01 ENCOUNTER — Ambulatory Visit (INDEPENDENT_AMBULATORY_CARE_PROVIDER_SITE_OTHER): Payer: Self-pay

## 2021-01-01 ENCOUNTER — Other Ambulatory Visit: Payer: Self-pay

## 2021-01-01 ENCOUNTER — Ambulatory Visit (INDEPENDENT_AMBULATORY_CARE_PROVIDER_SITE_OTHER): Payer: Self-pay | Admitting: Orthopaedic Surgery

## 2021-01-01 ENCOUNTER — Other Ambulatory Visit: Payer: Self-pay | Admitting: Obstetrics and Gynecology

## 2021-01-01 ENCOUNTER — Encounter: Payer: Self-pay | Admitting: Orthopaedic Surgery

## 2021-01-01 VITALS — Ht 69.0 in | Wt 159.0 lb

## 2021-01-01 DIAGNOSIS — M25562 Pain in left knee: Secondary | ICD-10-CM

## 2021-01-01 DIAGNOSIS — M79675 Pain in left toe(s): Secondary | ICD-10-CM

## 2021-01-01 DIAGNOSIS — M65331 Trigger finger, right middle finger: Secondary | ICD-10-CM

## 2021-01-01 DIAGNOSIS — M79671 Pain in right foot: Secondary | ICD-10-CM

## 2021-01-01 DIAGNOSIS — Z1231 Encounter for screening mammogram for malignant neoplasm of breast: Secondary | ICD-10-CM

## 2021-01-01 MED ORDER — METHYLPREDNISOLONE ACETATE 40 MG/ML IJ SUSP
13.3300 mg | INTRAMUSCULAR | Status: AC | PRN
  Administered 2021-01-01: 13.33 mg

## 2021-01-01 MED ORDER — BUPIVACAINE HCL 0.25 % IJ SOLN
0.3300 mL | INTRAMUSCULAR | Status: AC | PRN
  Administered 2021-01-01: .33 mL

## 2021-01-01 MED ORDER — TRAMADOL HCL 50 MG PO TABS
50.0000 mg | ORAL_TABLET | Freq: Three times a day (TID) | ORAL | 2 refills | Status: DC | PRN
  Filled 2021-01-01: qty 30, 10d supply, fill #0
  Filled 2021-01-16: qty 30, 10d supply, fill #1
  Filled 2021-02-19: qty 30, 10d supply, fill #2

## 2021-01-01 MED ORDER — LIDOCAINE HCL 1 % IJ SOLN
1.0000 mL | INTRAMUSCULAR | Status: AC | PRN
  Administered 2021-01-01: 1 mL

## 2021-01-01 NOTE — Progress Notes (Signed)
Office Visit Note   Patient: Isabel Evans           Date of Birth: [birth year]           MRN: [id number] Visit Date: 01/01/2021              Requested by: Charlott Rakes, MD [place],  [place] [address] PCP: Charlott Rakes, MD   Assessment & Plan: Visit Diagnoses:  1. Toe pain, left   2. Acute pain of left knee   3. Pain in right foot   4. Trigger finger, right middle finger     Plan: Impression is right foot, left great toe and left proximal tibia contusions in addition to recurrent right long trigger finger.  In regards to the right foot, we have placed her in a postoperative shoe to provide some support and hopefully help with the pain.  She will continue to ice and take anti-inflammatories as needed for the contusions.  In regards to the trigger finger, we have discussed 1 last cortisone injection.  She will follow-up with [place] as needed.  Follow-Up Instructions: Return if symptoms worsen or fail to improve.   Orders:  Orders Placed This Encounter  Procedures   Hand/UE Inj: R long A1   XR Knee 1-2 Views Left   XR Toe Great Left   XR Foot Complete Right   No orders of the defined types were placed in this encounter.     Procedures: Hand/UE Inj: R long A1 for trigger finger on 01/01/2021 9:09 AM Indications: pain Details: 25 G needle Medications: 1 mL lidocaine 1 %; 0.33 mL bupivacaine 0.25 %; 13.33 mg methylPREDNISolone acetate 40 MG/ML     Clinical Data: No additional findings.   Subjective: Chief Complaint  Patient presents with   Left Knee - Pain   Left Foot - Pain   Right Foot - Pain    HPI patient is a pleasant 50 year old female who comes in today following a mechanical fall about 3 days ago.  She is bringing her laundry in a house when she tripped and fell.  She is unsure how she landed.  She woke up the next day with pain and bruising to her left great toe as well as to the proximal tibia in addition to the entire right foot.   She has had pain with ambulation since.  Other issue she brings up today is recurrent pain and triggering to the right long finger.  She has been dealing with this for a while.  She has had 2 previous cortisone injections with the last 1 being about 10 months ago.  Review of Systems as detailed in HPI.  All others reviewed and are negative.   Objective: Vital Signs: Ht '5\' 9"'$  (1.753 m)   Wt 159 lb (72.1 kg)   LMP 07/04/2017   BMI 23.48 kg/m   Physical Exam well-developed well-nourished female no acute distress.  Alert and oriented x3.  Ortho Exam right hand exam shows moderate tenderness and palpable nodule at the A1 pulley of the long finger.  She does have reproducible triggering.  Right foot exam shows diffuse ecchymosis throughout the plantar aspect of the hindfoot.  No pain with range of motion.  Left foot shows moderate ecchymosis throughout the entire great toe.  Painful range of motion.  Left knee exam shows moderate tenderness over the tibial tubercle with a superficial abrasion.  She has slight pain with full extension of the knee.  No joint  line tenderness.  She is neurovascular intact distally.  No specialty comments available.  Imaging: XR Knee 1-2 Views Left  Result Date: 01/01/2021 No acute or structural abnormalities  XR Toe Great Left  Result Date: 01/01/2021 No acute or structural abnormalities  XR Foot Complete Right  Result Date: 01/01/2021 No acute or structural abnormalities    PMFS History: Patient Active Problem List   Diagnosis Date Noted   S/P cervical spinal fusion 09/16/2020   Cervical spinal stenosis 07/30/2020   Other spondylosis with radiculopathy, cervical region 06/18/2020   Major depressive disorder, recurrent episode, moderate (HCC) 05/26/2020   Protrusion of cervical intervertebral disc 03/22/2018   Anemia 11/24/2017   Pre-diabetes 11/24/2017   Blurry vision, bilateral 05/18/2017   Callus of foot 05/18/2017   Pain in joint of left hip  02/16/2017   Bruises easily 11/10/2016   Gastroesophageal reflux disease without esophagitis 09/22/2016   Fatigue associated with anemia 09/22/2016   Flexion contractures 09/22/2016   Generalized anxiety disorder 01/01/2016   Dental abscess 01/01/2016   Healthcare maintenance 05/08/2015   Adjustment disorder with mixed anxiety and depressed mood 05/08/2015   Stress at home 10/24/2014   Irritable bowel syndrome 10/24/2014   Anxiety state 10/24/2014   Preop testing 09/19/2014   Pap smear for cervical cancer screening 09/19/2014   Midline low back pain without sciatica 08/12/2014   He[place]den nodes 08/12/2014   Contracture of joint, hand 08/12/2014   Essential hypertension 02/11/2014   Allergy 02/11/2014   Screening for breast cancer 02/11/2014   Dupuytren's contracture of both hands 11/15/2013   Hallux valgus of right foot 11/15/2013   Back pain 05/21/2013   UTI (urinary tract infection) 05/21/2013   Multiple skin nodules 11/27/2012   Fibromyalgia 09/08/2012   Osteoarthritis 09/08/2012   Neck muscle spasm 07/14/2012   Plantar wart 07/14/2012   Insomnia 07/14/2012   Past Medical History:  Diagnosis Date   Anemia 11/24/2017   Anxiety    Bone spur    cervical spine   Chronic kidney disease 04/2017   kidney infections   Depression    Deviated nasal septum    states is unable to lie flat, because her nose will become congested and she can stop breathing   Diabetes mellitus without complication (HCC)    Dysrhythmia    heart flutters occasionally   Fibromyalgia    Fibromyalgia    GERD (gastroesophageal reflux disease)    Hallux abductovalgus with bunions 09/2014   right    Hammertoe 09/2014   right 4th, 5th   History of stomach ulcers    Hyperlipidemia    Hypertension    states is under control with med., has been on med. x 8 mos.   IBS (irritable bowel syndrome)    no current med.   Osteoarthritis    hands, bilateral hips   Peripheral vascular disease ([facility])     occasional swelling in both legs   Sinus headache     Family History  Problem Relation Age of Onset   Hypertension Mother    Diabetes Mother    Heart disease Mother    Hypertension Father    Diabetes Father    Prostate cancer Father    Heart disease Father    Alcohol abuse Father    Hypertension Sister    Diabetes Sister    Heart disease Maternal Grandmother        Great GM   Breast cancer Maternal Grandmother    Bone cancer Maternal Grandmother  Breast cancer Maternal Aunt    Ovarian cancer Maternal Aunt    Rheum arthritis Sister    Colon cancer Other        great Aunt   Rectal cancer Neg Hx    Stomach cancer Neg Hx     Past Surgical History:  Procedure Laterality Date   ABDOMINAL HYSTERECTOMY     ANTERIOR CERVICAL DECOMP/DISCECTOMY FUSION N/A 07/30/2020   Procedure: C5-6 ANTERIOR CERVICAL DECOMPRESSION/DISCECTOMY FUSION, ALLOGRAFT, PLATE;  Surgeon: Marybelle Killings, MD;  Location: [facility];  Service: Orthopedics;  Laterality: N/A;   BREAST BIOPSY Left 04/2018   benign   BREAST BIOPSY Left 2016   benign   BUNIONECTOMY Right 10/04/2014   Procedure:  [place], Emmaline Life;  Surgeon: Trula Slade, DPM;  Location: [facility];  Service: Podiatry;  Laterality: Right;   COLONOSCOPY WITH PROPOFOL  02/13/2013   CYSTOSCOPY N/A 07/07/2017   Procedure: CYSTOSCOPY;  Surgeon: Aletha Halim, MD;  Location: [facility];  Service: Gynecology;  Laterality: N/A;   DUPUYTREN / PALMAR FASCIOTOMY Right 07/18/2013   exc. 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   small finger   HAMMER TOE SURGERY Right 10/04/2014   Procedure: HAMMER TOE REPAIR 4TH  AND 5TH  RIGHT FOOT  fourth toe fixation with k wire;  Surgeon: Trula Slade, DPM;  Location: [facility];  Service: Podiatry;  Laterality: Right;   LEG SURGERY Left    as a child; near amputation of leg   TUBAL LIGATION     VAGINAL HYSTERECTOMY N/A 07/07/2017   Procedure:  HYSTERECTOMY VAGINAL;  Surgeon: Aletha Halim, MD;  Location: [facility];  Service: Gynecology;  Laterality: N/A;   Social History   Occupational History   Occupation: cook  Tobacco Use   Smoking status: Former    Packs/day: 1.00    Years: 20.00    Pack years: 20.00    Types: Cigarettes    Quit date: 08/24/2012    Years since quitting: 8.3   Smokeless tobacco: Never  Vaping Use   Vaping Use: Some days  Substance and Sexual Activity   Alcohol use: Yes    Comment: occasionally   Drug use: Yes    Types: Marijuana   Sexual activity: Yes    Birth control/protection: Surgical

## 2021-01-06 ENCOUNTER — Ambulatory Visit: Payer: Self-pay | Admitting: Orthopaedic Surgery

## 2021-01-08 ENCOUNTER — Encounter: Payer: Self-pay | Admitting: Physical Therapy

## 2021-01-16 ENCOUNTER — Other Ambulatory Visit: Payer: Self-pay | Admitting: Family Medicine

## 2021-01-16 ENCOUNTER — Other Ambulatory Visit: Payer: Self-pay | Admitting: Physician Assistant

## 2021-01-16 DIAGNOSIS — R110 Nausea: Secondary | ICD-10-CM

## 2021-01-16 DIAGNOSIS — M542 Cervicalgia: Secondary | ICD-10-CM

## 2021-01-16 DIAGNOSIS — E782 Mixed hyperlipidemia: Secondary | ICD-10-CM

## 2021-01-16 DIAGNOSIS — M2550 Pain in unspecified joint: Secondary | ICD-10-CM

## 2021-01-16 DIAGNOSIS — K582 Mixed irritable bowel syndrome: Secondary | ICD-10-CM

## 2021-01-16 MED FILL — Fluticasone Propionate Nasal Susp 50 MCG/ACT: NASAL | 30 days supply | Qty: 16 | Fill #5 | Status: AC

## 2021-01-17 NOTE — Telephone Encounter (Signed)
Requested medications are due for refill today requesting one week early  Requested medications are on the active medication list yes  Last refill 12/25/20  Last visit 11/20/20  Future visit scheduled 01/19/21  Notes to clinic requesting early, also, Zofran not delegated.

## 2021-01-17 NOTE — Telephone Encounter (Signed)
Requested medications are due for refill today early, filled 12/25/20  Requested medications are on the active medication list yes  Last refill 12/25/20  Last visit 05/2020  Future visit scheduled 01/19/21  Notes to clinic requesting one week early. Please assess.

## 2021-01-19 ENCOUNTER — Other Ambulatory Visit: Payer: Self-pay

## 2021-01-19 ENCOUNTER — Ambulatory Visit: Payer: Self-pay | Attending: Family Medicine

## 2021-01-19 MED ORDER — ROSUVASTATIN CALCIUM 20 MG PO TABS
ORAL_TABLET | ORAL | 2 refills | Status: DC
  Filled 2021-01-19: qty 30, 30d supply, fill #0
  Filled 2021-02-19: qty 30, 30d supply, fill #1
  Filled 2021-03-20: qty 30, 30d supply, fill #2

## 2021-01-19 MED ORDER — ONDANSETRON 8 MG PO TBDP
8.0000 mg | ORAL_TABLET | Freq: Every day | ORAL | 0 refills | Status: DC | PRN
  Filled 2021-01-19: qty 20, 20d supply, fill #0

## 2021-01-19 MED ORDER — DICYCLOMINE HCL 10 MG PO CAPS
ORAL_CAPSULE | ORAL | 2 refills | Status: DC
Start: 2021-01-19 — End: 2021-04-27
  Filled 2021-01-19: qty 60, 30d supply, fill #0
  Filled 2021-02-19: qty 60, 30d supply, fill #1
  Filled 2021-03-20: qty 60, 30d supply, fill #2

## 2021-01-19 MED ORDER — DICLOFENAC SODIUM 50 MG PO TBEC
DELAYED_RELEASE_TABLET | ORAL | 2 refills | Status: DC
  Filled 2021-01-19: qty 60, 30d supply, fill #0
  Filled 2021-02-19: qty 60, 30d supply, fill #1
  Filled 2021-03-20: qty 60, 30d supply, fill #2

## 2021-01-20 ENCOUNTER — Encounter: Payer: Self-pay | Admitting: Orthopaedic Surgery

## 2021-01-20 ENCOUNTER — Other Ambulatory Visit: Payer: Self-pay

## 2021-01-20 ENCOUNTER — Ambulatory Visit (INDEPENDENT_AMBULATORY_CARE_PROVIDER_SITE_OTHER): Payer: Self-pay | Admitting: Orthopaedic Surgery

## 2021-01-20 VITALS — BP 122/79 | HR 70 | Ht 69.5 in | Wt 157.0 lb

## 2021-01-20 DIAGNOSIS — M720 Palmar fascial fibromatosis [Dupuytren]: Secondary | ICD-10-CM

## 2021-01-20 DIAGNOSIS — M545 Low back pain, unspecified: Secondary | ICD-10-CM

## 2021-01-20 DIAGNOSIS — G8929 Other chronic pain: Secondary | ICD-10-CM

## 2021-01-20 DIAGNOSIS — Z981 Arthrodesis status: Secondary | ICD-10-CM

## 2021-01-20 NOTE — Progress Notes (Signed)
Office Visit Note   Patient: Isabel Evans           Date of Birth: [birth year]           MRN: [id number] Visit Date: 01/20/2021              Requested by: Charlott Rakes, MD [place],  [place] [address] PCP: Charlott Rakes, MD   Assessment & Plan: Visit Diagnoses:  1. Dupuytren's contracture of both hands   2. S/P cervical spinal fusion   3. Chronic midline low back pain without sciatica     Plan: Therapy is made her back symptoms worse she can stop her physical therapy.  She states she has disability hearing coming up in November.  She can follow-up here on an as-needed basis.  Follow-Up Instructions: No follow-ups on file.   Orders:  No orders of the defined types were placed in this encounter.  No orders of the defined types were placed in this encounter.     Procedures: No procedures performed   Clinical Data: No additional findings.   Subjective: Chief Complaint  Patient presents with   Spine - Pain    HPI 50 year old female returns states she is having ongoing problems with low back pain.  She still trying to work but states at best she is only been able to work about 20 hours maximum.  She has been through physical therapy and states that therapy actually made her pain worse.  She is use Goody powders but she not supposed to take that due to her stomach problems.  She has used a muscle relaxant and occasionally use tramadol.  Cervical fusion gave her some improvement in her symptoms she still has some discomfort in her neck but states the April C5-6 fusion did help some of her pain.  She continues to have problems with Dupuytren's.  Recent injection for recurrent triggering no triggering now just some soreness.  She has pain in her hands pain in her feet chronic back pain.  She is taking her Lexapro.  Review of Systems Positive anxiety and depression.  History of IBS.  He was cervical fusion C5-6.  Positive for hand pain and foot pain  chronic low back pain.  Objective: Vital Signs: BP 122/79   Pulse 70   Ht 5' 9.5" (1.765 m)   Wt 157 lb (71.2 kg)   LMP 07/04/2017   BMI 22.85 kg/m   Physical Exam Constitutional:      Appearance: She is well-developed.  HENT:     Head: Normocephalic.     Right Ear: External ear normal.     Left Ear: External ear normal. There is no impacted cerumen.  Eyes:     Pupils: Pupils are equal, round, and reactive to light.  Neck:     Thyroid: No thyromegaly.     Trachea: No tracheal deviation.  Cardiovascular:     Rate and Rhythm: Normal rate.  Pulmonary:     Effort: Pulmonary effort is normal.  Abdominal:     Palpations: Abdomen is soft.  Musculoskeletal:     Cervical back: No rigidity.  Skin:    General: Skin is warm and dry.  Neurological:     Mental Status: She is alert and oriented to person, place, and time.  Psychiatric:        Behavior: Behavior normal.    Ortho Exam recurrent Dupuytren contracture left fifth finger with PIP contraction.  Well-healed scars.  No triggering of the  right long finger currently tenderness over the A1 pulley.  Less severe Dupuytren's opposite right fifth finger.  Specialty Comments:  No specialty comments available.  Imaging: No results found.   PMFS History: Patient Active Problem List   Diagnosis Date Noted   S/P cervical spinal fusion 09/16/2020   Cervical spinal stenosis 07/30/2020   Other spondylosis with radiculopathy, cervical region 06/18/2020   Major depressive disorder, recurrent episode, moderate (HCC) 05/26/2020   Protrusion of cervical intervertebral disc 03/22/2018   Anemia 11/24/2017   Pre-diabetes 11/24/2017   Blurry vision, bilateral 05/18/2017   Callus of foot 05/18/2017   Pain in joint of left hip 02/16/2017   Bruises easily 11/10/2016   Gastroesophageal reflux disease without esophagitis 09/22/2016   Fatigue associated with anemia 09/22/2016   Flexion contractures 09/22/2016   Generalized anxiety  disorder 01/01/2016   Dental abscess 01/01/2016   Healthcare maintenance 05/08/2015   Adjustment disorder with mixed anxiety and depressed mood 05/08/2015   Stress at home 10/24/2014   Irritable bowel syndrome 10/24/2014   Anxiety state 10/24/2014   Preop testing 09/19/2014   Pap smear for cervical cancer screening 09/19/2014   Midline low back pain without sciatica 08/12/2014   [place] nodes 08/12/2014   Contracture of joint, hand 08/12/2014   Essential hypertension 02/11/2014   Allergy 02/11/2014   Screening for breast cancer 02/11/2014   Dupuytren's contracture of both hands 11/15/2013   Hallux valgus of right foot 11/15/2013   Back pain 05/21/2013   UTI (urinary tract infection) 05/21/2013   Multiple skin nodules 11/27/2012   Fibromyalgia 09/08/2012   Osteoarthritis 09/08/2012   Neck muscle spasm 07/14/2012   Plantar wart 07/14/2012   Insomnia 07/14/2012   Past Medical History:  Diagnosis Date   Anemia 11/24/2017   Anxiety    Bone spur    cervical spine   Chronic kidney disease 04/2017   kidney infections   Depression    Deviated nasal septum    states is unable to lie flat, because her nose will become congested and she can stop breathing   Diabetes mellitus without complication (HCC)    Dysrhythmia    heart flutters occasionally   Fibromyalgia    Fibromyalgia    GERD (gastroesophageal reflux disease)    Hallux abductovalgus with bunions 09/2014   right    Hammertoe 09/2014   right 4th, 5th   History of stomach ulcers    Hyperlipidemia    Hypertension    states is under control with med., has been on med. x 8 mos.   IBS (irritable bowel syndrome)    no current med.   Osteoarthritis    hands, bilateral hips   Peripheral vascular disease ([place])    occasional swelling in both legs   Sinus headache     Family History  Problem Relation Age of Onset   Hypertension Mother    Diabetes Mother    Heart disease Mother    Hypertension Father    Diabetes Father     Prostate cancer Father    Heart disease Father    Alcohol abuse Father    Hypertension Sister    Diabetes Sister    Heart disease Maternal Grandmother        Great GM   Breast cancer Maternal Grandmother    Bone cancer Maternal Grandmother    Breast cancer Maternal Aunt    Ovarian cancer Maternal Aunt    Rheum arthritis Sister    Colon cancer Other  great Aunt   Rectal cancer Neg Hx    Stomach cancer Neg Hx     Past Surgical History:  Procedure Laterality Date   ABDOMINAL HYSTERECTOMY     ANTERIOR CERVICAL DECOMP/DISCECTOMY FUSION N/A 07/30/2020   Procedure: C5-6 ANTERIOR CERVICAL DECOMPRESSION/DISCECTOMY FUSION, ALLOGRAFT, PLATE;  Surgeon: Marybelle Killings, MD;  Location: [facility];  Service: Orthopedics;  Laterality: N/A;   BREAST BIOPSY Left 04/2018   benign   BREAST BIOPSY Left 2016   benign   BUNIONECTOMY Right 10/04/2014   Procedure:  Altamese [place], Emmaline Life;  Surgeon: Trula Slade, DPM;  Location: [facility];  Service: Podiatry;  Laterality: Right;   COLONOSCOPY WITH PROPOFOL  02/13/2013   CYSTOSCOPY N/A 07/07/2017   Procedure: CYSTOSCOPY;  Surgeon: Aletha Halim, MD;  Location: [facility];  Service: Gynecology;  Laterality: N/A;   DUPUYTREN / PALMAR FASCIOTOMY Right 07/18/2013   exc. 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   small finger   HAMMER TOE SURGERY Right 10/04/2014   Procedure: HAMMER TOE REPAIR 4TH  AND 5TH  RIGHT FOOT  fourth toe fixation with k wire;  Surgeon: Trula Slade, DPM;  Location: [facility];  Service: Podiatry;  Laterality: Right;   LEG SURGERY Left    as a child; near amputation of leg   TUBAL LIGATION     VAGINAL HYSTERECTOMY N/A 07/07/2017   Procedure: HYSTERECTOMY VAGINAL;  Surgeon: Aletha Halim, MD;  Location: [facility];  Service: Gynecology;  Laterality: N/A;   Social History   Occupational History   Occupation: cook  Tobacco Use   Smoking status: Former     Packs/day: 1.00    Years: 20.00    Pack years: 20.00    Types: Cigarettes    Quit date: 08/24/2012    Years since quitting: 8.4   Smokeless tobacco: Never  Vaping Use   Vaping Use: Some days  Substance and Sexual Activity   Alcohol use: Yes    Comment: occasionally   Drug use: Yes    Types: Marijuana   Sexual activity: Yes    Birth control/protection: Surgical

## 2021-01-22 ENCOUNTER — Other Ambulatory Visit: Payer: Self-pay

## 2021-01-26 ENCOUNTER — Encounter (HOSPITAL_COMMUNITY): Payer: Self-pay

## 2021-01-26 ENCOUNTER — Other Ambulatory Visit: Payer: Self-pay

## 2021-01-26 ENCOUNTER — Telehealth (INDEPENDENT_AMBULATORY_CARE_PROVIDER_SITE_OTHER): Payer: No Payment, Other | Admitting: Psychiatry

## 2021-01-26 DIAGNOSIS — F411 Generalized anxiety disorder: Secondary | ICD-10-CM | POA: Diagnosis not present

## 2021-01-26 DIAGNOSIS — F331 Major depressive disorder, recurrent, moderate: Secondary | ICD-10-CM | POA: Diagnosis not present

## 2021-01-26 MED ORDER — QUETIAPINE FUMARATE 50 MG PO TABS
50.0000 mg | ORAL_TABLET | Freq: Every day | ORAL | 2 refills | Status: DC
  Filled 2021-01-26 – 2021-02-19 (×2): qty 30, 30d supply, fill #0
  Filled 2021-03-20: qty 30, 30d supply, fill #1
  Filled 2021-04-21 – 2021-04-27 (×2): qty 30, 30d supply, fill #2
  Filled 2021-04-28: qty 30, 30d supply, fill #0

## 2021-01-26 MED ORDER — GABAPENTIN 300 MG PO CAPS
300.0000 mg | ORAL_CAPSULE | Freq: Three times a day (TID) | ORAL | 2 refills | Status: DC
  Filled 2021-01-26 – 2021-02-19 (×2): qty 90, 30d supply, fill #0
  Filled 2021-03-20: qty 90, 30d supply, fill #1
  Filled 2021-04-21 – 2021-04-27 (×2): qty 90, 30d supply, fill #2
  Filled 2021-04-28: qty 90, 30d supply, fill #0

## 2021-01-26 MED ORDER — ESCITALOPRAM OXALATE 20 MG PO TABS
20.0000 mg | ORAL_TABLET | Freq: Every day | ORAL | 2 refills | Status: DC
  Filled 2021-01-26 – 2021-02-19 (×2): qty 30, 30d supply, fill #0
  Filled 2021-03-20: qty 30, 30d supply, fill #1
  Filled 2021-04-21 – 2021-04-27 (×2): qty 30, 30d supply, fill #2
  Filled 2021-04-28: qty 30, 30d supply, fill #0

## 2021-01-26 NOTE — Progress Notes (Signed)
[facility] NP Progress Note  Patient Identification: Isabel Evans MRN:  [id number] Date of Evaluation:  01/26/2021 Referral Source: PCP Chief Complaint:  Depression and anxiety Chief Complaint   Anxiety; Follow-up; Depression     Visit Diagnosis:  Major depressive disorder, moderate; General anxiety disorder  History of Present Illness:   50 yo female with depression and anxiety, symptoms have increased with her mother's decline in health and trying to care for her.  She recently walked out of her place of employment and is applying for disability.  Moderate levels of depression and anxiety, increases with stress, decreases with self-care.  Sleep is fair to poor, increasing the Seroquel to assist.  Appetite is steady. Supportive boyfriend.  No homicidal ideations, hallucinations, or mania symptoms.  Denies side effects from her medications, follow-up in 3 months.  Past Psychiatric History: depression and anxiety  Previous Psychotropic Medications: Yes   Substance Abuse History in the last 12 months:  Yes.    Consequences of Substance Abuse: NA  Past Medical History:  Past Medical History:  Diagnosis Date   Anemia 11/24/2017   Anxiety    Bone spur    cervical spine   Chronic kidney disease 04/2017   kidney infections   Depression    Deviated nasal septum    states is unable to lie flat, because her nose will become congested and she can stop breathing   Diabetes mellitus without complication (HCC)    Dysrhythmia    heart flutters occasionally   Fibromyalgia    Fibromyalgia    GERD (gastroesophageal reflux disease)    Hallux abductovalgus with bunions 09/2014   right    Hammertoe 09/2014   right 4th, 5th   History of stomach ulcers    Hyperlipidemia    Hypertension    states is under control with med., has been on med. x 8 mos.   IBS (irritable bowel syndrome)    no current med.   Osteoarthritis    hands, bilateral hips   Peripheral vascular disease (HCC)     occasional swelling in both legs   Sinus headache     Past Surgical History:  Procedure Laterality Date   ABDOMINAL HYSTERECTOMY     ANTERIOR CERVICAL DECOMP/DISCECTOMY FUSION N/A 07/30/2020   Procedure: C5-6 ANTERIOR CERVICAL DECOMPRESSION/DISCECTOMY FUSION, ALLOGRAFT, PLATE;  Surgeon: Marybelle Killings, MD;  Location: [facility];  Service: Orthopedics;  Laterality: N/A;   BREAST BIOPSY Left 04/2018   benign   BREAST BIOPSY Left 2016   benign   BUNIONECTOMY Right 10/04/2014   Procedure:  Altamese [place], Emmaline Life;  Surgeon: Trula Slade, DPM;  Location: [facility];  Service: Podiatry;  Laterality: Right;   COLONOSCOPY WITH PROPOFOL  02/13/2013   CYSTOSCOPY N/A 07/07/2017   Procedure: CYSTOSCOPY;  Surgeon: Aletha Halim, MD;  Location: [facility];  Service: Gynecology;  Laterality: N/A;   DUPUYTREN / PALMAR FASCIOTOMY Right 07/18/2013   exc. 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   small finger   HAMMER TOE SURGERY Right 10/04/2014   Procedure: HAMMER TOE REPAIR 4TH  AND 5TH  RIGHT FOOT  fourth toe fixation with k wire;  Surgeon: Trula Slade, DPM;  Location: [facility];  Service: Podiatry;  Laterality: Right;   LEG SURGERY Left    as a child; near amputation of leg   TUBAL LIGATION     VAGINAL HYSTERECTOMY N/A 07/07/2017   Procedure: HYSTERECTOMY VAGINAL;  Surgeon: Aletha Halim,  MD;  Location: [facility];  Service: Gynecology;  Laterality: N/A;    Family Psychiatric History: see below, mother and sister with depression and anxiety  Family History:  Family History  Problem Relation Age of Onset   Hypertension Mother    Diabetes Mother    Heart disease Mother    Hypertension Father    Diabetes Father    Prostate cancer Father    Heart disease Father    Alcohol abuse Father    Hypertension Sister    Diabetes Sister    Heart disease Maternal Grandmother        Great GM   Breast cancer Maternal Grandmother     Bone cancer Maternal Grandmother    Breast cancer Maternal Aunt    Ovarian cancer Maternal Aunt    Rheum arthritis Sister    Colon cancer Other        great Aunt   Rectal cancer Neg Hx    Stomach cancer Neg Hx     Social History:   Social History   Socioeconomic History   Marital status: Single    Spouse name: Not on file   Number of children: 3   Years of education: Not on file   Highest education level: 7th grade  Occupational History   Occupation: Training and development officer  Tobacco Use   Smoking status: Former    Packs/day: 1.00    Years: 20.00    Pack years: 20.00    Types: Cigarettes    Quit date: 08/24/2012    Years since quitting: 8.4   Smokeless tobacco: Never  Vaping Use   Vaping Use: Some days  Substance and Sexual Activity   Alcohol use: Yes    Comment: occasionally   Drug use: Yes    Types: Marijuana   Sexual activity: Yes    Birth control/protection: Surgical  Other Topics Concern   Not on file  Social History Narrative   Not on file   Social Determinants of Health   Financial Resource Strain: Not on file  Food Insecurity: Not on file  Transportation Needs: Not on file  Physical Activity: Not on file  Stress: Not on file  Social Connections: Not on file    Additional Social History: lives with supportive partner, works part-time  Allergies:  No Known Allergies  Metabolic Disorder Labs: Lab Results  Component Value Date   HGBA1C 5.6 11/24/2020   No results found for: PROLACTIN Lab Results  Component Value Date   CHOL 153 11/24/2020   TRIG 138 11/24/2020   HDL 73 11/24/2020   CHOLHDL 2.1 11/24/2020   [place] 57 11/24/2020   [place] 62 08/24/2019   Lab Results  Component Value Date   TSH 0.606 01/04/2020    Therapeutic Level Labs: No results found for: LITHIUM No results found for: CBMZ No results found for: VALPROATE  Current Medications: Current Outpatient Medications  Medication Sig Dispense Refill   diclofenac (VOLTAREN) 50 MG EC tablet  TAKE 1 TABLET (50 MG TOTAL) BY MOUTH 2 (TWO) TIMES DAILY. AFTER A MEAL AS NEEDED FOR PAIN 60 tablet 2   dicyclomine (BENTYL) 10 MG capsule TAKE 1 CAPSULE BY MOUTH 2 TIMES DAILY AS NEEDED 60 capsule 2   diphenhydrAMINE (BENADRYL) 25 MG tablet Take 25 mg by mouth daily.     escitalopram (LEXAPRO) 20 MG tablet Take 1 tablet (20 mg total) by mouth daily. 30 tablet 2   gabapentin (NEURONTIN) 300 MG capsule Take 2 capsules (600 mg total) by mouth 2 (two)  times daily. 120 capsule 2   metFORMIN (GLUCOPHAGE) 500 MG tablet TAKE 1 TABLET (500 MG TOTAL) BY MOUTH DAILY WITH BREAKFAST. 90 tablet 0   omeprazole (PRILOSEC) 20 MG capsule TAKE 2 CAPSULES (40 MG TOTAL) BY MOUTH DAILY. 60 capsule 2   ondansetron (ZOFRAN-ODT) 8 MG disintegrating tablet Take 1 tablet (8 mg total) by mouth daily as needed for nausea or vomiting. 20 tablet 0   QUEtiapine (SEROQUEL) 25 MG tablet Take 1 tablet (25 mg total) by mouth at bedtime. 30 tablet 2   rosuvastatin (CRESTOR) 20 MG tablet TAKE 1 TABLET (20 MG TOTAL) BY MOUTH DAILY. TO LOWER CHOLESTEROL 30 tablet 2   tiZANidine (ZANAFLEX) 4 MG tablet START WITH 1/2 TABLET BY MOUTH EVERY 8 HOURS. MAY CAUSE DROWSINEES. INCREASE TO 1 TABLET EVERY 8 HOURS AS NEEDED FOR MUSCLE IF TOLERABLE. 90 tablet 0   tiZANidine (ZANAFLEX) 4 MG tablet Take 1 tablet (4 mg total) by mouth 2 (two) times daily as needed for muscle spasms. 40 tablet 0   traMADol (ULTRAM) 50 MG tablet Take 1 tablet (50 mg total) by mouth 3 (three) times daily as needed. 30 tablet 2   No current facility-administered medications for this visit.    Musculoskeletal: Strength & Muscle Tone: within normal limits, per patient report Gait & Station: did not witness, telephone visit Patient leans: denies  Psychiatric Specialty Exam: Review of Systems  Psychiatric/Behavioral:  Positive for dysphoric mood. The patient is nervous/anxious.   All other systems reviewed and are negative.  Last menstrual period 07/04/2017.There is no  height or weight on file to calculate BMI.  General Appearance: UTA, telephone visit  Eye Contact:  UTA  Speech:  Normal Rate  Volume:  Normal  Mood:  Anxious and Depressed  Affect:  UTA  Thought Process:  Coherent and Descriptions of Associations: Intact  Orientation:  Full (Time, Place, and Person)  Thought Content:  WDL and Logical  Suicidal Thoughts:  No  Homicidal Thoughts:  No  Memory:  Immediate;   Good Recent;   Good Remote;   Good  Judgement:  Good  Insight:  Good  Psychomotor Activity:  Normal  Concentration:  Concentration: Good and Attention Span: Good  Recall:  Good  Fund of Knowledge:Good  Language: Good  Akathisia:  No  Handed:  Right  AIMS (if indicated):  not needed  Assets:  Housing Intimacy Leisure Time Resilience Social Support  ADL's:  Intact  Cognition: WNL  Sleep:  Fair   Screenings: GAD-7    Personnel officer Visit from 11/20/2020 in [facility] Office Visit from 06/19/2020 in [facility] Video Visit from 05/05/2020 in [facility] Office Visit from 01/04/2020 in [facility] Office Visit from 07/06/2019 in [facility]  Total GAD-7 Score 13 14 20 16 17       PHQ2-9    [facility] Office Visit from 11/20/2020 in [facility] Office Visit from 06/19/2020 in [facility] Video Visit from 06/09/2020 in [facility] from 03/24/2020 in [facility] Office Visit from 02/28/2020 in [facility]  PHQ-2 Total Score 4 4 0 1 5  PHQ-9 Total Score 16 15 -- 4 16      Flowsheet Row Admission (Discharged) from 07/30/2020 in [facility] Video  Visit from 06/09/2020 in [facility] from  12/26/2017 in [facility] No Risk No Risk Low Risk       Assessment and Plan:  General anxiety disorder and panic disorder: -Continue gabapentin 300 mg TID for anxiety and neuropathic pain  Major Depressive Disorder, recurrent, moderate -Continue Lexapro 20 mg daily -Establish and start therapy at [facility] or another facility  Chronic back Pain: -Continue care with MD  Insomnia: -Increase Seroquel 25 mg at bedtime PRN sleep for 50 mg daily  Virtual Visit via Video Note Follow up in 3 months  I connected with [name] on 01/26/21 at 10:30 AM EDT by a video enabled telemedicine application and verified that I am speaking with the correct person using two identifiers.  Location: Patient: home Provider: home office     I discussed the limitations of evaluation and management by telemedicine and the availability of in person appointments. The patient expressed understanding and agreed to proceed.  Follow Up Instructions: Follow up in 3 months   I discussed the assessment and treatment plan with the patient. The patient was provided an opportunity to ask questions and all were answered. The patient agreed with the plan and demonstrated an understanding of the instructions.   The patient was advised to call back or seek an in-person evaluation if the symptoms worsen or if the condition fails to improve as anticipated.  I provided 20 minutes of non-face-to-face time during this encounter.   Waylan Boga, NP  Waylan Boga, NP 10/10/202210:37 AM

## 2021-02-02 ENCOUNTER — Other Ambulatory Visit: Payer: Self-pay

## 2021-02-19 ENCOUNTER — Other Ambulatory Visit: Payer: Self-pay | Admitting: Family

## 2021-02-19 ENCOUNTER — Other Ambulatory Visit: Payer: Self-pay | Admitting: Family Medicine

## 2021-02-19 ENCOUNTER — Other Ambulatory Visit: Payer: Self-pay | Admitting: Physician Assistant

## 2021-02-19 ENCOUNTER — Ambulatory Visit: Payer: Self-pay | Admitting: *Deleted

## 2021-02-19 ENCOUNTER — Ambulatory Visit
Admission: RE | Admit: 2021-02-19 | Discharge: 2021-02-19 | Disposition: A | Discharge status: Home / Self Care | Payer: No Typology Code available for payment source | Source: Ambulatory Visit | Attending: Family Medicine | Admitting: Family Medicine

## 2021-02-19 ENCOUNTER — Other Ambulatory Visit: Payer: Self-pay

## 2021-02-19 VITALS — BP 116/74 | Wt 154.3 lb

## 2021-02-19 DIAGNOSIS — R7303 Prediabetes: Secondary | ICD-10-CM

## 2021-02-19 DIAGNOSIS — K219 Gastro-esophageal reflux disease without esophagitis: Secondary | ICD-10-CM

## 2021-02-19 DIAGNOSIS — R110 Nausea: Secondary | ICD-10-CM

## 2021-02-19 DIAGNOSIS — Z1231 Encounter for screening mammogram for malignant neoplasm of breast: Secondary | ICD-10-CM

## 2021-02-19 DIAGNOSIS — Z1239 Encounter for other screening for malignant neoplasm of breast: Secondary | ICD-10-CM

## 2021-02-19 DIAGNOSIS — M545 Low back pain, unspecified: Secondary | ICD-10-CM

## 2021-02-19 DIAGNOSIS — M542 Cervicalgia: Secondary | ICD-10-CM

## 2021-02-19 NOTE — Patient Instructions (Addendum)
Explained breast self awareness with [name]. Patient did not need a Pap smear today due to patient has a history of a hysterectomy for benign reasons. Let her know that patient doesn't need any further Pap smears due to her history of a hysterectomy for benign reasons. Referred patient to the [facility] for a screening mammogram on mobile unit. Appointment scheduled Thursday, February 19, 2021 at 1510. Patient escorted to the mobile unit following [facility] appointment for her screening mammogram. Let patient know the [facility] will follow up with her within the next couple weeks with results of her mammogram by letter or phone. Discussed smoking cessation with patient. Referred to the [facility] Quitline and gave resources to the free smoking cessation classes at [facility]. [name] verbalized understanding.  Bryann Gentz, Arvil Chaco, RN 2:18 PM

## 2021-02-19 NOTE — Progress Notes (Signed)
Isabel Evans is a 50 y.o. female who presents to [facility] clinic today with complaint of left upper outer breast pain that per patient is the same pain she stated on previous exam 07/11/2014. Patient states the pain radiates to the axilla. Patient states the pain comes and goes. Patient rates the pain at a 4 out of 10.    Pap Smear: Pap smear not completed today. Last Pap smear was 09/22/2016 at [facility] and normal with negative HPV. Patient has a history of an abnormal Pap smear 07/13/2012 that was LSIL and a colposcopy was completed for follow-up 08/02/2012 that was CIN-I. Patient has a history of a hysterectomy in 07/07/2017 due to AUB and fibroids. Patient no longer needs Pap smears due to her history of a hysterectomy for benign reasons per BCCCP and ASCCP guidelines. Last Pap smear result is in Epic.   Physical exam: Breasts Breasts symmetrical. No skin abnormalities bilateral breasts. No nipple retraction bilateral breasts. No nipple discharge bilateral breasts. No lymphadenopathy. No lumps palpated bilateral breasts. Complaints of right upper breast tenderness on exam. Complaints of left upper outer breast tenderness on exam.       MS DIGITAL SCREENING BILATERAL  Result Date: 02/09/2017 CLINICAL DATA:  Screening. Benign left breast biopsy in 2016. EXAM: DIGITAL SCREENING BILATERAL MAMMOGRAM WITH CAD COMPARISON:  Previous exam(s). ACR Breast Density Category c: The breast tissue is heterogeneously dense, which may obscure small masses. FINDINGS: Biopsy site marker within the outer left breast is stable in position. There are no findings suspicious for malignancy within either breast. Images were processed with CAD. IMPRESSION: No mammographic evidence of malignancy. A result letter of this screening mammogram will be mailed directly to the patient. RECOMMENDATION: Screening mammogram in one year. (Code:SM-B-01Y) BI-RADS CATEGORY  2: Benign. Electronically Signed    By: Franki Cabot M.D.   On: 02/09/2017 08:18   MM DIAG BREAST TOMO UNI LEFT  Result Date: 04/26/2018 CLINICAL DATA:  Recall from screening mammography with tomosynthesis, possible mass involving the UPPER OUTER QUADRANT of the LEFT breast. Prior benign core needle biopsy of this location on 07/19/2014 with pathology revealing fibrocystic changes with adenosis and calcifications. Patient states family history of breast cancer in maternal grandmother and family history of breast cancer and ovarian cancer in a maternal aunt. EXAM: DIGITAL DIAGNOSTIC LEFT MAMMOGRAM WITH TOMO ULTRASOUND LEFT BREAST COMPARISON:  Previous exam(s). ACR Breast Density Category c: The breast tissue is heterogeneously dense, which may obscure small masses. FINDINGS: Tomosynthesis and synthesized spot-compression CC and MLO views of the area of concern in the LEFT breast were obtained. There may be a partially obscured low-density mass on the order of 1 cm in size in the [place] depth adjacent to the ribbon shaped tissue marker clip placed at the time of the prior benign biopsy. There is no associated architectural distortion or suspicious calcifications. Targeted LEFT breast ultrasound is performed, showing that the previously identified oval nearly isoechoic mass at the 12:30 o'clock position approximately 6 cm from the nipple, demonstrating no posterior characteristics and no internal power Doppler flow, has increased in size since the prior ultrasound from March, 2016. The mass currently measures approximately 0.5 x 0.8 x 1.1 cm (previously 0.3 x 0.6 x 0.6 cm at the time of biopsy). The echogenic tissue marker clip is present at the SUPERIOR margin of this mass. Sonographic evaluation of the LEFT axilla demonstrates no pathologic lymphadenopathy. IMPRESSION: 1. Indeterminate approximate 1.1  cm mass involving the UPPER OUTER QUADRANT of the LEFT breast at the 12:30 o'clock position approximately 6 cm from  the nipple. 2. No pathologic LEFT axillary lymphadenopathy. RECOMMENDATION: Ultrasound-guided core needle biopsy of the indeterminate LEFT breast mass. The ultrasound core needle biopsy procedure was discussed with the patient and her questions were answered. She has agreed to proceed and the biopsy will be scheduled at her convenience. I have discussed the findings and recommendations with the patient. Results were also provided in writing at the conclusion of the visit. BI-RADS CATEGORY  4: Suspicious. Electronically Signed   By: Evangeline Dakin M.D.   On: 04/26/2018 15:52   MS DIGITAL SCREENING TOMO BILATERAL  Result Date: 04/20/2018 CLINICAL DATA:  Screening. EXAM: DIGITAL SCREENING BILATERAL MAMMOGRAM WITH TOMO AND CAD COMPARISON:  Previous exam(s). ACR Breast Density Category b: There are scattered areas of fibroglandular density. FINDINGS: In the left breast, a possible mass warrants further evaluation. This possible mass is seen within the outer LEFT breast, at posterior depth, with an adjacent biopsy clip, CC slice 27. In the right breast, no findings suspicious for malignancy. Images were processed with CAD. IMPRESSION: Further evaluation is suggested for possible mass in the left breast. RECOMMENDATION: Diagnostic mammogram and possibly ultrasound of the left breast. (Code:FI-L-69M) The patient will be contacted regarding the findings, and additional imaging will be scheduled. BI-RADS CATEGORY  0: Incomplete. Need additional imaging evaluation and/or prior mammograms for comparison. Electronically Signed   By: Franki Cabot M.D.   On: 04/20/2018 16:40   MS DIGITAL DIAG TOMO BILAT  Result Date: 08/07/2019 CLINICAL DATA:  50 year old presenting with approximate 6 month history of focal pain and tenderness in the subareolar RIGHT breast and an approximate 1 year history of diffuse UPPER LEFT breast pain. The patient has had 2 benign core needle biopsies of the UPPER OUTER QUADRANT of the LEFT breast,  most recently 1 year ago. EXAM: DIGITAL DIAGNOSTIC BILATERAL MAMMOGRAM WITH CAD AND TOMO ULTRASOUND RIGHT BREAST COMPARISON:  Previous exam(s). ACR Breast Density Category c: The breast tissue is heterogeneously dense, which may obscure small masses. FINDINGS: Tomosynthesis and synthesized full field CC and MLO views of both breasts were obtained. Tomosynthesis and synthesized spot compression tangential view of the area of focal pain in the subareolar RIGHT breast was also obtained. No mammographic abnormality in the area of focal pain in the subareolar RIGHT breast. Normal dense fibroglandular tissue is present in this location. No findings suspicious for malignancy in the RIGHT breast. Adjacent ribbon clip and coil clip at the site of prior benign biopsy in the [place] of the LEFT breast with stable findings in this location. No findings suspicious for malignancy in the LEFT breast. Mammographic images were processed with CAD. Targeted RIGHT breast ultrasound is performed, showing normal dense fibroglandular tissue in the subareolar location in the area of focal pain. Upper normal caliber ducts are present at the 4 o'clock subareolar location without evidence of an intraductal mass. No suspicious solid mass or abnormal acoustic shadowing is identified. IMPRESSION: 1. No mammographic or sonographic evidence of malignancy involving the RIGHT breast. 2. No mammographic evidence of malignancy involving the LEFT breast. RECOMMENDATION: Screening mammogram in one year.(Code:SM-B-01Y) Strategies for alleviating breast pain including decreasing caffeine intake, vitamin-E supplementation, and avoidance of tight compression brassieres, including underwire brassieres, were discussed with the patient. I have discussed the findings and recommendations with the patient. If applicable, a reminder letter will be sent to the patient regarding the next appointment.  BI-RADS CATEGORY  2: Benign. Electronically Signed    By: Evangeline Dakin M.D.   On: 08/07/2019 10:29   MM CLIP PLACEMENT LEFT  Result Date: 04/28/2018 CLINICAL DATA:  Ultrasound-guided biopsy was performed of a mass in the upper outer left breast, 12:30 region. Biopsy was recommended of a mass that was previously biopsied in 2016. EXAM: DIAGNOSTIC LEFT MAMMOGRAM POST ULTRASOUND BIOPSY COMPARISON:  Previous exam(s). FINDINGS: Mammographic images were obtained following ultrasound guided biopsy of a mass in the 12:30 position of the left breast 6 cm from the nipple. The coil shaped biopsy clip is adjacent to ingest slightly lateral to the biopsied mass. IMPRESSION: Coil shaped biopsy clip adjacent to the lateral margin of the biopsied mass. Final Assessment: Post Procedure Mammograms for Marker Placement Electronically Signed   By: Curlene Dolphin M.D.   On: 04/28/2018 14:46    Pelvic/Bimanual Pap smear not indicated per BCCCP guidelines.   Smoking History: Patient is a former smoker that quit smoking cigarettes in 2014. Patient currently vapes. Discussed smoking cessation with patient. Referred to the [facility] Quitline and gave resources to the free smoking cessation classes at [facility].   Patient Navigation: Patient education provided. Access to services provided for patient through [facility] program.   Colorectal Cancer Screening: Per patient has had colonoscopy completed on 04/10/2020 at [place].  No complaints today.    Breast and Cervical Cancer Risk Assessment: Patient has a family history of her maternal grandmother and a maternal aunt having breast cancer. Patient has no known genetic mutations or history of radiation treatment to the chest before age 35. Patient has history of cervical dysplasia. Patient has no history of being immunocompromised or DES exposure in-utero.  Risk Assessment     Risk Scores       02/19/2021 04/20/2018   Last edited by: Royston Bake, CMA Rolena Infante H, LPN   5-year risk: 0.8 % 0.8 %   Lifetime risk:  7.2 % 7.6 %            A: BCCCP exam without pap smear Complaints of left upper outer breast tenderness.  P: Referred patient to the [facility] for a screening mammogram on mobile unit. Appointment scheduled Thursday, February 19, 2021 at 1510.  Loletta Parish, RN 02/19/2021 2:18 PM

## 2021-02-20 ENCOUNTER — Other Ambulatory Visit: Payer: Self-pay | Admitting: Pharmacist

## 2021-02-20 ENCOUNTER — Other Ambulatory Visit: Payer: Self-pay

## 2021-02-20 DIAGNOSIS — K219 Gastro-esophageal reflux disease without esophagitis: Secondary | ICD-10-CM

## 2021-02-20 MED ORDER — TIZANIDINE HCL 4 MG PO TABS
ORAL_TABLET | ORAL | 0 refills | Status: DC
  Filled 2021-02-20: qty 90, 30d supply, fill #0

## 2021-02-20 MED ORDER — METFORMIN HCL 500 MG PO TABS
ORAL_TABLET | Freq: Every day | ORAL | 0 refills | Status: DC
  Filled 2021-02-20: qty 30, 30d supply, fill #0
  Filled 2021-03-20: qty 30, 30d supply, fill #1
  Filled 2021-04-21 – 2021-04-27 (×2): qty 30, 30d supply, fill #2
  Filled 2021-04-28 (×3): qty 30, 30d supply, fill #0

## 2021-02-20 MED ORDER — OMEPRAZOLE 20 MG PO CPDR
40.0000 mg | DELAYED_RELEASE_CAPSULE | Freq: Every day | ORAL | 2 refills | Status: DC
  Filled 2021-02-20: qty 60, 30d supply, fill #0
  Filled 2021-03-20: qty 60, 30d supply, fill #1
  Filled 2021-04-21 – 2021-04-27 (×2): qty 60, 30d supply, fill #2
  Filled 2021-04-28: qty 60, 30d supply, fill #0

## 2021-02-20 MED ORDER — ONDANSETRON 8 MG PO TBDP
8.0000 mg | ORAL_TABLET | Freq: Every day | ORAL | 0 refills | Status: DC | PRN
  Filled 2021-02-20: qty 20, 20d supply, fill #0

## 2021-02-20 MED ORDER — DIPHENHYDRAMINE HCL 25 MG PO TABS
25.0000 mg | ORAL_TABLET | Freq: Every day | ORAL | 0 refills | Status: DC
  Filled 2021-02-20 – 2021-06-17 (×3): qty 30, 30d supply, fill #0

## 2021-02-20 NOTE — Telephone Encounter (Signed)
Requested medications are due for refill today yes  Requested medications are on the active medication list yes  Last refill 12/25/20, 01/22/21  Last visit 11/20/20  Future visit scheduled 05/25/21  Notes to clinic Not Delegated.  Requested Prescriptions  Pending Prescriptions Disp Refills   tiZANidine (ZANAFLEX) 4 MG tablet 90 tablet 0    Sig: START WITH 1/2 TABLET BY MOUTH EVERY 8 HOURS. MAY CAUSE DROWSINEES. INCREASE TO 1 TABLET EVERY 8 HOURS AS NEEDED FOR MUSCLE IF TOLERABLE.     Not Delegated - Cardiovascular:  Alpha-2 Agonists - tizanidine Failed - 02/19/2021 11:36 PM      Failed - This refill cannot be delegated      Passed - Valid encounter within last 6 months    Recent Outpatient Visits           3 months ago Vasomotor symptoms due to menopause   [place], Enobong, MD   4 months ago UTI symptoms   [place], [place] W, NP   8 months ago Other spondylosis with radiculopathy, cervical region   [place], Enobong, MD   8 months ago Smoking   [place], Levada Dy [place], [place]   11 months ago [place], [place], [place]       Future Appointments             In 3 days Sherilyn Cooter, MD [place]   In 3 months Charlott Rakes, MD [place]             ondansetron (ZOFRAN-ODT) 8 MG disintegrating tablet 20 tablet 0    Sig: Take 1 tablet (8 mg total) by mouth daily as needed for nausea or vomiting.     Not Delegated - Gastroenterology: Antiemetics Failed - 02/19/2021 11:36 PM      Failed - This refill cannot be delegated      Passed - Valid encounter within last 6 months    Recent Outpatient Visits           3 months ago Vasomotor symptoms due to menopause   [place],  Enobong, MD   4 months ago UTI symptoms   [place], [place] W, NP   8 months ago Other spondylosis with radiculopathy, cervical region   [place], Enobong, MD   8 months ago Smoking   [place], Levada Dy [place], [place]   11 months ago [place], Dionne [place], [place]       Future Appointments             In 3 days Sherilyn Cooter, MD [place]   In 3 months Charlott Rakes, MD [place]            Signed Prescriptions Disp Refills   metFORMIN (GLUCOPHAGE) 500 MG tablet 90 tablet 0    Sig: TAKE 1 TABLET (500 MG TOTAL) BY MOUTH DAILY WITH BREAKFAST.     Endocrinology:  Diabetes - Biguanides Passed - 02/19/2021 11:36 PM      Passed - Cr in normal range and within 360 days    Creat  Date Value Ref Range  Status  11/27/2015 0.75 0.50 - 1.10 mg/dL Final   Creatinine, Ser  Date Value Ref Range Status  11/24/2020 0.83 0.57 - 1.00 mg/dL Final          Passed - HBA1C is between 0 and 7.9 and within 180 days    Hgb A1c MFr Bld  Date Value Ref Range Status  11/24/2020 5.6 4.8 - 5.6 % Final    Comment:             Prediabetes: 5.7 - 6.4          Diabetes: >6.4          Glycemic control for adults with diabetes: <7.0           Passed - eGFR in normal range and within 360 days    GFR calc Af Amer  Date Value Ref Range Status  05/28/2020 69 >59 mL/min/1.73 Final    Comment:    **In accordance with recommendations from the NKF-ASN Task force,**   [facility] is in the process of updating its eGFR calculation to the   2021 CKD-EPI creatinine equation that estimates kidney function   without a race variable.    GFR, Estimated  Date Value Ref Range Status  07/30/2020 >60 >60 mL/min Final    Comment:    (NOTE) Calculated using the CKD-EPI Creatinine Equation (2021)     GFR  Date Value Ref Range Status  03/24/2020 89.57 >60.00 mL/min Final    Comment:    Calculated using the CKD-EPI Creatinine Equation (2021)   eGFR  Date Value Ref Range Status  11/24/2020 86 >59 mL/min/1.73 Final          Passed - Valid encounter within last 6 months    Recent Outpatient Visits           3 months ago Vasomotor symptoms due to menopause   [place], Enobong, MD   4 months ago UTI symptoms   [place], Vernia Buff, NP   8 months ago Other spondylosis with radiculopathy, cervical region   [place], Enobong, MD   8 months ago Smoking   [place], [place], [place]   11 months ago [place], [place]       Future Appointments             In 3 days Sherilyn Cooter, MD [place]   In 3 months Charlott Rakes, MD [place]

## 2021-02-23 ENCOUNTER — Other Ambulatory Visit: Payer: Self-pay

## 2021-02-23 ENCOUNTER — Ambulatory Visit (INDEPENDENT_AMBULATORY_CARE_PROVIDER_SITE_OTHER): Payer: Self-pay | Admitting: Orthopedic Surgery

## 2021-02-23 ENCOUNTER — Encounter: Payer: Self-pay | Admitting: Orthopedic Surgery

## 2021-02-23 VITALS — BP 138/93 | HR 80 | Ht 69.5 in | Wt 157.0 lb

## 2021-02-23 DIAGNOSIS — M720 Palmar fascial fibromatosis [Dupuytren]: Secondary | ICD-10-CM

## 2021-02-23 NOTE — Progress Notes (Signed)
Office Visit Note   Patient: Isabel Evans           Date of Birth: 1970/08/06           MRN: [id number] Visit Date: 02/23/2021              Requested by: Charlott Rakes, MD [place],  [place] [id number] PCP: Charlott Rakes, MD   Assessment & Plan: Visit Diagnoses:  1. Dupuytren's contracture of both hands     Plan: We reviewed the nature of Dupuytren's disease.  Of note, she has very severe disease involving both hands.  She has extensive family history.  She has disease present in her feet.  We discussed her left small finger contracture.  She had surgery back in 2014 after which her fingers was completely straight with recurrence nearly 6 months later that was worse than before surgery.  She has a fixed flexion contracture of 90 degrees of the PIP joint.  We discussed the big risk of trying to revise this finger is that it could recur with worse contracture again.  We also discussed that attempted correction of the PIP joint contracture could result in insult to the digital neurovascular bundles resulting in a dysvascular finger.  She is not interested in pursuing surgery on this left side given the speed and severity of her recurrence.  We discussed her right PIP joint which is also contracted to 55 degrees.  She has a cord over P1 but no cord the palm.  She is not interested in pursuing surgery in the side either given the significant recurrence on the contralateral side.  Her left hand is quite debilitated with this disease.  She has limited use of the hand secondary to her severe contracture.  She wants to continue to monitor her contractures for now.   Follow-Up Instructions: No follow-ups on file.   Orders:  No orders of the defined types were placed in this encounter.  No orders of the defined types were placed in this encounter.     Procedures: No procedures performed   Clinical Data: No additional findings.   Subjective: Chief Complaint   Patient presents with   Right Hand - Pain    Dupuytren's Contracture of 5th digit, Right hand Dom., -n/t, + Weakness and swelling, pain: 4-5/10 worse when she gets to work and is using them, drops things, onset x years,  hx of surgery 2014 for dupuytren's contracture   Left Hand - Pain    Dupuytren's Contracture of 4th and 5th digit, Hx of surgery in 2014 for dupuytren's contracture    This is a 50 year old right-hand-dominant female who works as a Training and development officer at [employer] who presents with bilateral Dupuytren's disease.  Her left is more severely affected than the right.  Her left started years ago.  She was seen at [facility] at which time she had a left digital fasciectomy performed in thousand 14.  Her disease returned within 6 months and is now worse than it was before.  She is a fixed contracture of this PIP joint.  She has limited use of this hand secondary to the severe contracture.  He was seen by an outside hand surgeon approximately 2 years ago who noted that he did not want to do any revision surgery as she had such a severe and rapid recurrence.  Regarding the right side, she has a cord over the PIP joint with a 55 degree flexion contracture.  She has no  cord in the palm on the side.  She has difficulty with her job and with her activity such as gripping a steering wheel or holding onto the phone.  She has pain in this left hand at the more proximal cord.  Of note, she has severe disease in her feet.  He she has a extensive family history with multiple first-degree relatives with severe Dupuytren's contractures.   Review of Systems   Objective: Vital Signs: BP (!) 138/93 (BP Location: Left Arm, Patient Position: Sitting)   Pulse 80   Ht 5' 9.5" (1.765 m)   Wt 157 lb (71.2 kg)   LMP 07/04/2017   BMI 22.85 kg/m   Physical Exam Constitutional:      Appearance: Normal appearance.  Cardiovascular:     Rate and Rhythm: Normal rate.     Pulses: Normal pulses.  Pulmonary:     Effort:  Pulmonary effort is normal.  Skin:    General: Skin is warm and dry.     Capillary Refill: Capillary refill takes less than 2 seconds.  Neurological:     Mental Status: She is alert.    Right Hand Exam   Other  Erythema: absent Sensation: normal Pulse: present  Comments:  Palpable cord over proximal phalanx of small finger.  SF with 55 degree flexion contracture with flexion to approximately 80 degrees.  No significant cord in palm.    Left Hand Exam   Other  Erythema: absent Sensation: normal Pulse: present  Comments:  90 degree fixed flexion contracture of the small finger.  Large cord involving most of small finger proximal phalanx.  Large cord in palm in line with small finger with 30 degree flexion contracture at MP joint. Previous surgical incision at midlateral aspect of ulnar small finger     Specialty Comments:  No specialty comments available.  Imaging: No results found.   PMFS History: Patient Active Problem List   Diagnosis Date Noted   S/P cervical spinal fusion 09/16/2020   Cervical spinal stenosis 07/30/2020   Other spondylosis with radiculopathy, cervical region 06/18/2020   Major depressive disorder, recurrent episode, moderate (HCC) 05/26/2020   Protrusion of cervical intervertebral disc 03/22/2018   Anemia 11/24/2017   Pre-diabetes 11/24/2017   Blurry vision, bilateral 05/18/2017   Callus of foot 05/18/2017   Pain in joint of left hip 02/16/2017   Bruises easily 11/10/2016   Gastroesophageal reflux disease without esophagitis 09/22/2016   Fatigue associated with anemia 09/22/2016   Flexion contractures 09/22/2016   Generalized anxiety disorder 01/01/2016   Dental abscess 01/01/2016   Healthcare maintenance 05/08/2015   Adjustment disorder with mixed anxiety and depressed mood 05/08/2015   Stress at home 10/24/2014   Irritable bowel syndrome 10/24/2014   Anxiety state 10/24/2014   Preop testing 09/19/2014   Pap smear for cervical cancer  screening 09/19/2014   Midline low back pain without sciatica 08/12/2014   [place] nodes 08/12/2014   Contracture of joint, hand 08/12/2014   Essential hypertension 02/11/2014   Allergy 02/11/2014   Screening for breast cancer 02/11/2014   Dupuytren's contracture of both hands 11/15/2013   Hallux valgus of right foot 11/15/2013   Back pain 05/21/2013   UTI (urinary tract infection) 05/21/2013   Multiple skin nodules 11/27/2012   Fibromyalgia 09/08/2012   Osteoarthritis 09/08/2012   Neck muscle spasm 07/14/2012   Plantar wart 07/14/2012   Insomnia 07/14/2012   Past Medical History:  Diagnosis Date   Anemia 11/24/2017   Anxiety    Bone  spur    cervical spine   Chronic kidney disease 04/2017   kidney infections   Depression    Deviated nasal septum    states is unable to lie flat, because her nose will become congested and she can stop breathing   Diabetes mellitus without complication (HCC)    Dysrhythmia    heart flutters occasionally   Fibromyalgia    Fibromyalgia    GERD (gastroesophageal reflux disease)    Hallux abductovalgus with bunions 09/2014   right    Hammertoe 09/2014   right 4th, 5th   History of stomach ulcers    Hyperlipidemia    Hypertension    states is under control with med., has been on med. x 8 mos.   IBS (irritable bowel syndrome)    no current med.   Osteoarthritis    hands, bilateral hips   Peripheral vascular disease (HCC)    occasional swelling in both legs   Sinus headache     Family History  Problem Relation Age of Onset   Hypertension Mother    Diabetes Mother    Heart disease Mother    Hypertension Father    Diabetes Father    Prostate cancer Father    Heart disease Father    Alcohol abuse Father    Hypertension Sister    Diabetes Sister    Heart disease Maternal Grandmother        Great GM   Breast cancer Maternal Grandmother    Bone cancer Maternal Grandmother    Breast cancer Maternal Aunt    Ovarian cancer Maternal Aunt     Rheum arthritis Sister    Colon cancer Other        great Aunt   Rectal cancer Neg Hx    Stomach cancer Neg Hx     Past Surgical History:  Procedure Laterality Date   ABDOMINAL HYSTERECTOMY     ANTERIOR CERVICAL DECOMP/DISCECTOMY FUSION N/A 07/30/2020   Procedure: C5-6 ANTERIOR CERVICAL DECOMPRESSION/DISCECTOMY FUSION, ALLOGRAFT, PLATE;  Surgeon: Marybelle Killings, MD;  Location: [facility];  Service: Orthopedics;  Laterality: N/A;   BREAST BIOPSY Left 04/2018   benign   BREAST BIOPSY Left 2016   benign   BUNIONECTOMY Right 10/04/2014   Procedure:  Altamese Taylor, Emmaline Life;  Surgeon: Trula Slade, DPM;  Location: [facility];  Service: Podiatry;  Laterality: Right;   COLONOSCOPY WITH PROPOFOL  02/13/2013   CYSTOSCOPY N/A 07/07/2017   Procedure: CYSTOSCOPY;  Surgeon: Aletha Halim, MD;  Location: [facility];  Service: Gynecology;  Laterality: N/A;   DUPUYTREN / PALMAR FASCIOTOMY Right 07/18/2013   exc. 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   small finger   HAMMER TOE SURGERY Right 10/04/2014   Procedure: HAMMER TOE REPAIR 4TH  AND 5TH  RIGHT FOOT  fourth toe fixation with k wire;  Surgeon: Trula Slade, DPM;  Location: [facility];  Service: Podiatry;  Laterality: Right;   LEG SURGERY Left    as a child; near amputation of leg   TUBAL LIGATION     VAGINAL HYSTERECTOMY N/A 07/07/2017   Procedure: HYSTERECTOMY VAGINAL;  Surgeon: Aletha Halim, MD;  Location: [facility];  Service: Gynecology;  Laterality: N/A;   Social History   Occupational History   Occupation: cook  Tobacco Use   Smoking status: Former    Packs/day: 1.00    Years: 20.00    Pack years: 20.00    Types: Cigarettes  Quit date: 08/24/2012    Years since quitting: 8.5   Smokeless tobacco: Never  Vaping Use   Vaping Use: Every day   Substances: Flavoring  Substance and Sexual Activity   Alcohol use: Yes    Comment: occasionally   Drug use:  Yes    Types: Marijuana    Comment: occ.   Sexual activity: Yes    Birth control/protection: Surgical

## 2021-02-25 ENCOUNTER — Ambulatory Visit (INDEPENDENT_AMBULATORY_CARE_PROVIDER_SITE_OTHER): Payer: No Payment, Other | Admitting: Licensed Clinical Social Worker

## 2021-02-25 ENCOUNTER — Encounter (HOSPITAL_COMMUNITY): Payer: Self-pay | Admitting: Licensed Clinical Social Worker

## 2021-02-25 DIAGNOSIS — F411 Generalized anxiety disorder: Secondary | ICD-10-CM

## 2021-02-25 DIAGNOSIS — F331 Major depressive disorder, recurrent, moderate: Secondary | ICD-10-CM

## 2021-02-25 NOTE — Plan of Care (Signed)
Pt agreeable to plan  ?
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
## 2021-02-26 ENCOUNTER — Ambulatory Visit (INDEPENDENT_AMBULATORY_CARE_PROVIDER_SITE_OTHER): Payer: No Typology Code available for payment source | Admitting: Podiatry

## 2021-02-26 ENCOUNTER — Other Ambulatory Visit: Payer: Self-pay

## 2021-02-26 ENCOUNTER — Encounter: Payer: Self-pay | Admitting: Podiatry

## 2021-02-26 ENCOUNTER — Ambulatory Visit (INDEPENDENT_AMBULATORY_CARE_PROVIDER_SITE_OTHER): Payer: No Typology Code available for payment source

## 2021-02-26 DIAGNOSIS — D3610 Benign neoplasm of peripheral nerves and autonomic nervous system, unspecified: Secondary | ICD-10-CM

## 2021-02-26 NOTE — Progress Notes (Signed)
Subjective:   Patient ID: Isabel Evans, female   DOB: 50 y.o.   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

## 2021-03-20 ENCOUNTER — Other Ambulatory Visit: Payer: Self-pay | Admitting: Physician Assistant

## 2021-03-20 ENCOUNTER — Other Ambulatory Visit: Payer: Self-pay | Admitting: Family Medicine

## 2021-03-20 ENCOUNTER — Other Ambulatory Visit: Payer: Self-pay

## 2021-03-20 DIAGNOSIS — M545 Low back pain, unspecified: Secondary | ICD-10-CM

## 2021-03-20 DIAGNOSIS — R110 Nausea: Secondary | ICD-10-CM

## 2021-03-20 DIAGNOSIS — M542 Cervicalgia: Secondary | ICD-10-CM

## 2021-03-20 MED ORDER — ONDANSETRON 8 MG PO TBDP
8.0000 mg | ORAL_TABLET | Freq: Every day | ORAL | 0 refills | Status: DC | PRN
  Filled 2021-03-20: qty 20, 20d supply, fill #0

## 2021-03-20 MED ORDER — TIZANIDINE HCL 4 MG PO TABS
ORAL_TABLET | ORAL | 0 refills | Status: DC
  Filled 2021-03-20: qty 90, 30d supply, fill #0

## 2021-03-23 ENCOUNTER — Other Ambulatory Visit: Payer: Self-pay

## 2021-03-24 ENCOUNTER — Other Ambulatory Visit: Payer: Self-pay

## 2021-04-03 ENCOUNTER — Encounter (HOSPITAL_COMMUNITY): Payer: Self-pay

## 2021-04-06 ENCOUNTER — Other Ambulatory Visit: Payer: Self-pay

## 2021-04-06 ENCOUNTER — Other Ambulatory Visit: Payer: Self-pay | Admitting: Physician Assistant

## 2021-04-08 ENCOUNTER — Ambulatory Visit (INDEPENDENT_AMBULATORY_CARE_PROVIDER_SITE_OTHER): Payer: Self-pay | Admitting: Family Medicine

## 2021-04-08 ENCOUNTER — Other Ambulatory Visit: Payer: Self-pay

## 2021-04-08 ENCOUNTER — Encounter: Payer: Self-pay | Admitting: Family Medicine

## 2021-04-08 VITALS — BP 156/84 | HR 73 | Ht 69.0 in | Wt 152.2 lb

## 2021-04-08 DIAGNOSIS — F419 Anxiety disorder, unspecified: Secondary | ICD-10-CM

## 2021-04-08 DIAGNOSIS — Z809 Family history of malignant neoplasm, unspecified: Secondary | ICD-10-CM

## 2021-04-08 DIAGNOSIS — Z780 Asymptomatic menopausal state: Secondary | ICD-10-CM

## 2021-04-08 MED ORDER — ESTRADIOL 0.5 MG PO TABS
0.5000 mg | ORAL_TABLET | Freq: Every day | ORAL | 3 refills | Status: DC
  Filled 2021-04-08: qty 30, 30d supply, fill #0
  Filled 2021-04-21 – 2021-04-27 (×2): qty 30, 30d supply, fill #1
  Filled 2021-04-28 (×2): qty 30, 30d supply, fill #0
  Filled 2021-05-25: qty 30, 30d supply, fill #1
  Filled 2021-06-17: qty 30, 30d supply, fill #2
  Filled 2021-07-27: qty 30, 30d supply, fill #3
  Filled 2021-09-04: qty 30, 30d supply, fill #4
  Filled 2021-10-06 – 2021-10-18 (×4): qty 30, 30d supply, fill #5
  Filled 2021-11-27: qty 30, 30d supply, fill #6
  Filled 2021-12-30: qty 30, 30d supply, fill #7

## 2021-04-08 NOTE — Progress Notes (Signed)
GYNECOLOGY OFFICE VISIT NOTE  History:   Isabel Evans is a 50 y.o. [phone] here today for concern regarding ovarian cancer risk and menopause symptoms.  Maternal aunt has had breast and ovarian cancer, another aunt had breast cancer as well Maternal grandmother had breast cancer Maternal aunt has had colon cancer Her mother and sisters have not had any cancers Denies abdominal pain Intermittent constipation related to her IBS Has had a colonoscopy about a year ago, reports everything was good  Wt Readings from Last 3 Encounters:  04/08/21 152 lb 3.2 oz (69 kg)  02/23/21 157 lb (71.2 kg)  02/19/21 154 lb 4.8 oz (70 kg)    Has also been very bothered by menopause symptoms for about the past two years Having intense hot flashes, especially at night Vaginal dryness as well and lack of libido Interested in hormonal therapy   Health Maintenance Due  Topic Date Due   COVID-19 Vaccine (1) Never done   Pneumococcal Vaccine 77-75 Years old (1 - PCV) Never done   Hepatitis C Screening  Never done   INFLUENZA VACCINE  11/17/2020    Past Medical History:  Diagnosis Date   Anemia 11/24/2017   Anxiety    Bone spur    cervical spine   Chronic kidney disease 04/2017   kidney infections   Depression    Deviated nasal septum    states is unable to lie flat, because her nose will become congested and she can stop breathing   Diabetes mellitus without complication (HCC)    Dysrhythmia    heart flutters occasionally   Fibromyalgia    Fibromyalgia    GERD (gastroesophageal reflux disease)    Hallux abductovalgus with bunions 09/2014   right    Hammertoe 09/2014   right 4th, 5th   History of stomach ulcers    Hyperlipidemia    Hypertension    states is under control with med., has been on med. x 8 mos.   IBS (irritable bowel syndrome)    no current med.   Osteoarthritis    hands, bilateral hips   Peripheral vascular disease (HCC)    occasional swelling in both legs    Sinus headache     Past Surgical History:  Procedure Laterality Date   ABDOMINAL HYSTERECTOMY     ANTERIOR CERVICAL DECOMP/DISCECTOMY FUSION N/A 07/30/2020   Procedure: C5-6 ANTERIOR CERVICAL DECOMPRESSION/DISCECTOMY FUSION, ALLOGRAFT, PLATE;  Surgeon: Eldred Manges, MD;  Location: [facility];  Service: Orthopedics;  Laterality: N/A;   BREAST BIOPSY Left 04/2018   benign   BREAST BIOPSY Left 2016   benign   BUNIONECTOMY Right 10/04/2014   Procedure:  Serafina Royals, Donzetta Sprung;  Surgeon: Vivi Barrack, DPM;  Location: [facility];  Service: Podiatry;  Laterality: Right;   COLONOSCOPY WITH PROPOFOL  02/13/2013   CYSTOSCOPY N/A 07/07/2017   Procedure: CYSTOSCOPY;  Surgeon: Worth Bing, MD;  Location: [facility];  Service: Gynecology;  Laterality: N/A;   DUPUYTREN / PALMAR FASCIOTOMY Right 07/18/2013   exc. 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   small finger   HAMMER TOE SURGERY Right 10/04/2014   Procedure: HAMMER TOE REPAIR 4TH  AND 5TH  RIGHT FOOT  fourth toe fixation with k wire;  Surgeon: Vivi Barrack, DPM;  Location: [facility];  Service: Podiatry;  Laterality: Right;   LEG SURGERY Left    as a child; near amputation of leg   TUBAL LIGATION  VAGINAL HYSTERECTOMY N/A 07/07/2017   Procedure: HYSTERECTOMY VAGINAL;  Surgeon: Aletha Halim, MD;  Location: [facility];  Service: Gynecology;  Laterality: N/A;    The following portions of the patient's history were reviewed and updated as appropriate: allergies, current medications, past family history, past medical history, past social history, past surgical history and problem list.   Health Maintenance:   Last pap: Lab Results  Component Value Date   DIAGPAP  09/22/2016    NEGATIVE FOR INTRAEPITHELIAL LESIONS OR MALIGNANCY.   HPV NOT DETECTED 09/22/2016   S/p hysterectomy  Last mammogram:  02/19/2021: BIRADS 1    Review of Systems:  Pertinent items  noted in HPI and remainder of comprehensive ROS otherwise negative.  Physical Exam:  BP (!) 156/84    Pulse 73    Ht $R'5\' 9"'rY$  (1.753 m)    Wt 152 lb 3.2 oz (69 kg)    LMP 07/04/2017    BMI 22.48 kg/m  CONSTITUTIONAL: Well-developed, well-nourished female in no acute distress.  HEENT:  Normocephalic, atraumatic. External right and left ear normal. No scleral icterus.  NECK: Normal range of motion, supple, no masses noted on observation SKIN: No rash noted. Not diaphoretic. No erythema. No pallor. MUSCULOSKELETAL: Normal range of motion. No edema noted. NEUROLOGIC: Alert and oriented to person, place, and time. Normal muscle tone coordination.  PSYCHIATRIC: Normal mood and affect. Normal behavior. Normal judgment and thought content. RESPIRATORY: Effort normal, no problems with respiration noted   Labs and Imaging No results found for this or any previous visit (from the past 168 hour(s)). No results found.    Assessment and Plan:   Problem List Items Addressed This Visit       Other   Family history of cancer - Primary    Patient very concerned about risk of ovarian cancer given family hx, however given lack of first degree relative not sure she qualifies for TVUS or CA-125 screening and the lack of sens/spec make them problematic to begin with. Recommended instead that she see genetics to discuss if further testing for BRCA genes would be indicated, she is in agreement with this plan. Also encouraged her to speak with her aunt since there is a good chance she has already been tested for BRCA given she is a breast and ovarian cancer survivor.  Patient also up to date on breast/cervical/colon cancer screening.       Relevant Orders   Ambulatory referral to Genetics   Menopause    Multiple years of ongoing symptoms. CVD risk low per calculator. Suggested she clarify BRCA risk prior to starting therapy which she will consider. Discussed not recommended therapy duration be >5 years. Recommend  she resume treatment for HTN.       Relevant Medications   estradiol (ESTRACE) 0.5 MG tablet   Other Visit Diagnoses     Anxiety       Relevant Orders   Ambulatory referral to [place]       Routine preventative health maintenance measures emphasized. Please refer to After Visit Summary for other counseling recommendations.   Return in about 4 weeks (around 05/06/2021) for follow up menopause, virtual.    Total face-to-face time with patient: 30 minutes.  Over 50% of encounter was spent on counseling and coordination of care.   Clarnce Flock, MD/MPH Attending Family Medicine Physician, [facility] for [facility], [place]

## 2021-04-08 NOTE — Assessment & Plan Note (Addendum)
Patient very concerned about risk of ovarian cancer given family hx, however given lack of first degree relative not sure she qualifies for TVUS or CA-125 screening and the lack of sens/spec make them problematic to begin with. Recommended instead that she see genetics to discuss if further testing for BRCA genes would be indicated, she is in agreement with this plan. Also encouraged her to speak with her aunt since there is a good chance she has already been tested for BRCA given she is a breast and ovarian cancer survivor.  Patient also up to date on breast/cervical/colon cancer screening.

## 2021-04-08 NOTE — Assessment & Plan Note (Addendum)
Multiple years of ongoing symptoms. CVD risk low per calculator. Suggested she clarify BRCA risk prior to starting therapy which she will consider. Discussed not recommended therapy duration be >5 years. Recommend she resume treatment for HTN.

## 2021-04-09 ENCOUNTER — Ambulatory Visit (HOSPITAL_COMMUNITY): Payer: No Payment, Other | Admitting: Licensed Clinical Social Worker

## 2021-04-15 ENCOUNTER — Other Ambulatory Visit: Payer: Self-pay

## 2021-04-17 ENCOUNTER — Other Ambulatory Visit: Payer: Self-pay

## 2021-04-21 ENCOUNTER — Other Ambulatory Visit: Payer: Self-pay | Admitting: Family Medicine

## 2021-04-21 ENCOUNTER — Other Ambulatory Visit: Payer: Self-pay | Admitting: Physician Assistant

## 2021-04-21 ENCOUNTER — Other Ambulatory Visit: Payer: Self-pay

## 2021-04-21 DIAGNOSIS — E782 Mixed hyperlipidemia: Secondary | ICD-10-CM

## 2021-04-21 DIAGNOSIS — M545 Low back pain, unspecified: Secondary | ICD-10-CM

## 2021-04-21 DIAGNOSIS — K582 Mixed irritable bowel syndrome: Secondary | ICD-10-CM

## 2021-04-21 DIAGNOSIS — M542 Cervicalgia: Secondary | ICD-10-CM

## 2021-04-21 DIAGNOSIS — M2550 Pain in unspecified joint: Secondary | ICD-10-CM

## 2021-04-22 ENCOUNTER — Other Ambulatory Visit: Payer: Self-pay

## 2021-04-27 ENCOUNTER — Other Ambulatory Visit: Payer: Self-pay | Admitting: Family Medicine

## 2021-04-27 ENCOUNTER — Other Ambulatory Visit: Payer: Self-pay | Admitting: Physician Assistant

## 2021-04-27 ENCOUNTER — Telehealth (HOSPITAL_COMMUNITY): Payer: No Payment, Other | Admitting: Psychiatry

## 2021-04-27 DIAGNOSIS — K582 Mixed irritable bowel syndrome: Secondary | ICD-10-CM

## 2021-04-27 DIAGNOSIS — M545 Low back pain, unspecified: Secondary | ICD-10-CM

## 2021-04-27 DIAGNOSIS — M542 Cervicalgia: Secondary | ICD-10-CM

## 2021-04-27 DIAGNOSIS — E782 Mixed hyperlipidemia: Secondary | ICD-10-CM

## 2021-04-27 DIAGNOSIS — M2550 Pain in unspecified joint: Secondary | ICD-10-CM

## 2021-04-28 ENCOUNTER — Telehealth (INDEPENDENT_AMBULATORY_CARE_PROVIDER_SITE_OTHER): Payer: No Payment, Other | Admitting: Psychiatry

## 2021-04-28 ENCOUNTER — Other Ambulatory Visit: Payer: Self-pay

## 2021-04-28 ENCOUNTER — Encounter (HOSPITAL_COMMUNITY): Payer: Self-pay | Admitting: Psychiatry

## 2021-04-28 DIAGNOSIS — F411 Generalized anxiety disorder: Secondary | ICD-10-CM | POA: Diagnosis not present

## 2021-04-28 DIAGNOSIS — F331 Major depressive disorder, recurrent, moderate: Secondary | ICD-10-CM

## 2021-04-28 MED ORDER — DICYCLOMINE HCL 10 MG PO CAPS
ORAL_CAPSULE | ORAL | 2 refills | Status: DC
  Filled 2021-04-28: qty 60, fill #0
  Filled 2021-04-28: qty 60, 30d supply, fill #0
  Filled 2021-05-25: qty 60, 30d supply, fill #1
  Filled 2021-06-17: qty 60, 30d supply, fill #2

## 2021-04-28 MED ORDER — GABAPENTIN 300 MG PO CAPS
300.0000 mg | ORAL_CAPSULE | Freq: Three times a day (TID) | ORAL | 2 refills | Status: DC
  Filled 2021-04-28 – 2021-05-25 (×2): qty 90, 30d supply, fill #0
  Filled 2021-06-17 – 2021-06-30 (×3): qty 90, 30d supply, fill #1

## 2021-04-28 MED ORDER — ROSUVASTATIN CALCIUM 20 MG PO TABS
ORAL_TABLET | ORAL | 2 refills | Status: DC
  Filled 2021-04-28: qty 30, 30d supply, fill #0
  Filled 2021-04-28: qty 30, fill #0
  Filled 2021-05-25: qty 30, 30d supply, fill #1
  Filled 2021-06-17: qty 30, 30d supply, fill #2

## 2021-04-28 MED ORDER — QUETIAPINE FUMARATE 50 MG PO TABS
25.0000 mg | ORAL_TABLET | Freq: Every day | ORAL | 2 refills | Status: DC
  Filled 2021-04-28 (×2): qty 30, 60d supply, fill #0
  Filled 2021-05-25 – 2021-06-17 (×2): qty 30, 60d supply, fill #1

## 2021-04-28 MED ORDER — DICLOFENAC SODIUM 50 MG PO TBEC
DELAYED_RELEASE_TABLET | ORAL | 2 refills | Status: DC
  Filled 2021-04-28: qty 60, 30d supply, fill #0
  Filled 2021-04-28: qty 60, fill #0
  Filled 2021-05-25: qty 60, 30d supply, fill #1
  Filled 2021-06-17: qty 60, 30d supply, fill #2

## 2021-04-28 MED ORDER — ESCITALOPRAM OXALATE 20 MG PO TABS
20.0000 mg | ORAL_TABLET | Freq: Every day | ORAL | 2 refills | Status: DC
  Filled 2021-04-28 – 2021-05-25 (×2): qty 30, 30d supply, fill #0
  Filled 2021-06-17: qty 30, 30d supply, fill #1

## 2021-04-28 NOTE — Telephone Encounter (Signed)
Requested medication (s) are due for refill today: yes  Requested medication (s) are on the active medication list: yes  Last refill:  03/24/21  Future visit scheduled: 05/2021  Notes to clinic:  This medication can not be delegated, please assess.   Requested Prescriptions  Pending Prescriptions Disp Refills   tiZANidine (ZANAFLEX) 4 MG tablet 90 tablet 0    Sig: START WITH 1/2 TABLET BY MOUTH EVERY 8 HOURS. MAY CAUSE DROWSINEES. INCREASE TO 1 TABLET EVERY 8 HOURS AS NEEDED FOR MUSCLE IF TOLERABLE.     Not Delegated - Cardiovascular:  Alpha-2 Agonists - tizanidine Failed - 04/27/2021 11:18 PM      Failed - This refill cannot be delegated      Passed - Valid encounter within last 6 months    Recent Outpatient Visits           5 months ago Vasomotor symptoms due to menopause   [place], Enobong, MD   6 months ago UTI symptoms   [place], Vernia Buff, NP   10 months ago Other spondylosis with radiculopathy, cervical region   [place], Enobong, MD   11 months ago [place], [place], [place]   1 year ago [place], Dionne Bucy, [place]       Future Appointments             In 1 week Dione Plover, Annice Needy, MD Center for [employer] at [place] for Women, [facility]   In 3 weeks Charlott Rakes, MD [place]            Signed Prescriptions Disp Refills   rosuvastatin (CRESTOR) 20 MG tablet 30 tablet 2    Sig: TAKE 1 TABLET (20 MG TOTAL) BY MOUTH DAILY. TO LOWER CHOLESTEROL     Cardiovascular:  Antilipid - Statins Passed - 04/27/2021 11:18 PM      Passed - Total Cholesterol in normal range and within 360 days    Cholesterol, Total  Date Value Ref Range Status  11/24/2020 153 100 - 199 mg/dL Final          Passed - LDL  in normal range and within 360 days    LDL Chol Calc (NIH)  Date Value Ref Range Status  11/24/2020 57 0 - 99 mg/dL Final          Passed - HDL in normal range and within 360 days    HDL  Date Value Ref Range Status  11/24/2020 73 >39 mg/dL Final          Passed - Triglycerides in normal range and within 360 days    Triglycerides  Date Value Ref Range Status  11/24/2020 138 0 - 149 mg/dL Final          Passed - Patient is not pregnant      Passed - Valid encounter within last 12 months    Recent Outpatient Visits           5 months ago Vasomotor symptoms due to menopause   [place], Charlane Ferretti, MD   6 months ago UTI symptoms   [place], NP   10 months ago Other spondylosis with radiculopathy, cervical region   [facility] Charlott Rakes, MD   11 months ago Smoking   [place], Dionne Bucy, [place]   1 year ago [place], Dionne Bucy, [place]       Future Appointments             In 1 week Dione Plover, Annice Needy, MD Center for [employer] at [facility] for Women, [facility]   In 3 weeks Charlott Rakes, MD [place]             diclofenac (VOLTAREN) 50 MG EC tablet 60 tablet 2    Sig: TAKE 1 TABLET (50 MG TOTAL) BY MOUTH 2 (TWO) TIMES DAILY. AFTER A MEAL AS NEEDED FOR PAIN     Analgesics:  NSAIDS Passed - 04/27/2021 11:18 PM      Passed - Cr in normal range and within 360 days    Creat  Date Value Ref Range Status  11/27/2015 0.75 0.50 - 1.10 mg/dL Final   Creatinine, Ser  Date Value Ref Range Status  11/24/2020 0.83 0.57 - 1.00 mg/dL Final          Passed - HGB in normal range and within 360 days    Hemoglobin  Date Value Ref Range Status  07/30/2020 14.2 12.0 - 15.0 g/dL Final  05/28/2020 11.8 11.1 - 15.9 g/dL  Final          Passed - Patient is not pregnant      Passed - Valid encounter within last 12 months    Recent Outpatient Visits           5 months ago Vasomotor symptoms due to menopause   [place], Charlane Ferretti, MD   6 months ago UTI symptoms   [place], [place] W, NP   10 months ago Other spondylosis with radiculopathy, cervical region   [place], Enobong, MD   11 months ago [place], [place], [place]   1 year ago [place], Dionne Bucy, [place]       Future Appointments             In 1 week Dione Plover, Annice Needy, MD Center for [employer] at [facility] for Women, [facility]   In 3 weeks Charlott Rakes, MD [place]             dicyclomine (BENTYL) 10 MG capsule 60 capsule 2    Sig: TAKE 1 CAPSULE BY MOUTH 2 TIMES DAILY AS NEEDED     Gastroenterology:  Antispasmodic Agents Passed - 04/27/2021 11:18 PM      Passed - Last Heart Rate in normal range    Pulse Readings from Last 1 Encounters:  04/08/21 73          Passed - Valid encounter within last 12 months    Recent Outpatient Visits           5 months ago Vasomotor symptoms due to menopause   [place], Enobong, MD   6 months ago UTI symptoms   [place], NP   10 months ago Other spondylosis with radiculopathy, cervical region   [facility]  [place], Enobong, MD   11 months ago Smoking   [place], Dionne Bucy, [place]   1 year ago [place], Dionne Bucy, [place]       Future Appointments             In 1 week Dione Plover, Annice Needy, MD Center for [employer] at [facility], [facility]   In 3 weeks Charlott Rakes, MD [place]

## 2021-04-28 NOTE — Progress Notes (Signed)
[facility] OP NP Progress Note  Patient Identification: Isabel Evans MRN:  [id number] Date of Evaluation:  04/28/2021 Chief Complaint:  Depression and anxiety Chief Complaint   Anxiety; Follow-up; Depression     Visit Diagnosis:  Major depressive disorder, moderate; General anxiety disorder  History of Present Illness:   51 yo female with depression and anxiety, symptoms have increased with her mother's decline in health and trying to care for her along with multiple financial issues.  No suicidal ideations. Supportive boyfriend.  Her anxiety has increased with more panic attacks at times.  The Seroquel increased her sleep "too much", decreasing the dose.  Decrease in appetite.  No homicidal ideations, hallucinations, or mania symptoms.  She feels the Lexapro is helping her manage her depression better and did establish therapy and started.  Denies side effects from her medications, follow-up in 3 months.  Past Psychiatric History: depression and anxiety  Previous Psychotropic Medications: Yes   Substance Abuse History in the last 12 months:  Yes.    Consequences of Substance Abuse: NA  Past Medical History:  Past Medical History:  Diagnosis Date   Anemia 11/24/2017   Anxiety    Bone spur    cervical spine   Chronic kidney disease 04/2017   kidney infections   Depression    Deviated nasal septum    states is unable to lie flat, because her nose will become congested and she can stop breathing   Diabetes mellitus without complication (HCC)    Dysrhythmia    heart flutters occasionally   Fibromyalgia    Fibromyalgia    GERD (gastroesophageal reflux disease)    Hallux abductovalgus with bunions 09/2014   right    Hammertoe 09/2014   right 4th, 5th   History of stomach ulcers    Hyperlipidemia    Hypertension    states is under control with med., has been on med. x 8 mos.   IBS (irritable bowel syndrome)    no current med.   Osteoarthritis    hands, bilateral hips    Peripheral vascular disease (HCC)    occasional swelling in both legs   Sinus headache     Past Surgical History:  Procedure Laterality Date   ABDOMINAL HYSTERECTOMY     ANTERIOR CERVICAL DECOMP/DISCECTOMY FUSION N/A 07/30/2020   Procedure: C5-6 ANTERIOR CERVICAL DECOMPRESSION/DISCECTOMY FUSION, ALLOGRAFT, PLATE;  Surgeon: Marybelle Killings, MD;  Location: [facility];  Service: Orthopedics;  Laterality: N/A;   BREAST BIOPSY Left 04/2018   benign   BREAST BIOPSY Left 2016   benign   BUNIONECTOMY Right 10/04/2014   Procedure:  [place], Emmaline Life;  Surgeon: Trula Slade, DPM;  Location: [facility];  Service: Podiatry;  Laterality: Right;   COLONOSCOPY WITH PROPOFOL  02/13/2013   CYSTOSCOPY N/A 07/07/2017   Procedure: CYSTOSCOPY;  Surgeon: Aletha Halim, MD;  Location: [facility];  Service: Gynecology;  Laterality: N/A;   DUPUYTREN / PALMAR FASCIOTOMY Right 07/18/2013   exc. 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   small finger   HAMMER TOE SURGERY Right 10/04/2014   Procedure: HAMMER TOE REPAIR 4TH  AND 5TH  RIGHT FOOT  fourth toe fixation with k wire;  Surgeon: Trula Slade, DPM;  Location: [facility];  Service: Podiatry;  Laterality: Right;   LEG SURGERY Left    as a child; near amputation of leg   TUBAL LIGATION     VAGINAL HYSTERECTOMY N/A 07/07/2017  Procedure: HYSTERECTOMY VAGINAL;  Surgeon: Aletha Halim, MD;  Location: [facility];  Service: Gynecology;  Laterality: N/A;    Family Psychiatric History: see below, mother and sister with depression and anxiety  Family History:  Family History  Problem Relation Age of Onset   Hypertension Mother    Diabetes Mother    Heart disease Mother    Hypertension Father    Diabetes Father    Prostate cancer Father    Heart disease Father    Alcohol abuse Father    Hypertension Sister    Diabetes Sister    Heart disease Maternal Grandmother        Great GM    Breast cancer Maternal Grandmother    Bone cancer Maternal Grandmother    Breast cancer Maternal Aunt    Ovarian cancer Maternal Aunt    Rheum arthritis Sister    Colon cancer Other        great Aunt   Rectal cancer Neg Hx    Stomach cancer Neg Hx     Social History:   Social History   Socioeconomic History   Marital status: Single    Spouse name: Not on file   Number of children: 3   Years of education: Not on file   Highest education level: 7th grade  Occupational History   Occupation: Training and development officer  Tobacco Use   Smoking status: Former    Years: 20.00    Types: Cigarettes, E-cigarettes    Start date: 01/19/2021    Quit date: 08/24/2012    Years since quitting: 8.6   Smokeless tobacco: Never  Vaping Use   Vaping Use: Every day   Substances: Flavoring  Substance and Sexual Activity   Alcohol use: Yes    Comment: occasionally   Drug use: Yes    Types: Marijuana    Comment: occ.   Sexual activity: Yes    Partners: Male    Birth control/protection: Surgical    Comment: 1 partner  Other Topics Concern   Not on file  Social History Narrative   Not on file   Social Determinants of Health   Financial Resource Strain: Low Risk    Difficulty of Paying Living Expenses: Not very hard  Food Insecurity: No Food Insecurity   Worried About Charity fundraiser in the Last Year: Never true   Ran Out of Food in the Last Year: Never true  Transportation Needs: No Transportation Needs   Lack of Transportation (Medical): No   Lack of Transportation (Non-Medical): No  Physical Activity: Insufficiently Active   Days of Exercise per Week: 1 day   Minutes of Exercise per Session: 30 min  Stress: No Stress Concern Present   Feeling of Stress : Only a little  Social Connections: Moderately Isolated   Frequency of Communication with Friends and Family: Twice a week   Frequency of Social Gatherings with Friends and Family: Once a week   Attends Religious Services: 1 to 4 times per year    Active Member of [employer] or Organizations: No   Attends Archivist Meetings: Never   Marital Status: Never married    Additional Social History: lives with supportive partner, works part-time  Allergies:  No Known Allergies  Metabolic Disorder Labs: Lab Results  Component Value Date   HGBA1C 5.6 11/24/2020   No results found for: PROLACTIN Lab Results  Component Value Date   CHOL 153 11/24/2020   TRIG 138 11/24/2020   HDL 73 11/24/2020   CHOLHDL  2.1 11/24/2020   LDLCALC 57 11/24/2020   LDLCALC 62 08/24/2019   Lab Results  Component Value Date   TSH 0.606 01/04/2020    Therapeutic Level Labs: No results found for: LITHIUM No results found for: CBMZ No results found for: VALPROATE  Current Medications: Current Outpatient Medications  Medication Sig Dispense Refill   QUEtiapine (SEROQUEL) 50 MG tablet Take 0.5 tablets (25 mg total) by mouth at bedtime. 30 tablet 2   diclofenac (VOLTAREN) 50 MG EC tablet TAKE 1 TABLET (50 MG TOTAL) BY MOUTH 2 (TWO) TIMES DAILY. AFTER A MEAL AS NEEDED FOR PAIN 60 tablet 2   dicyclomine (BENTYL) 10 MG capsule TAKE 1 CAPSULE BY MOUTH 2 TIMES DAILY AS NEEDED 60 capsule 2   diphenhydrAMINE (BENADRYL) 25 MG tablet Take 1 tablet (25 mg total) by mouth daily. 30 tablet 0   escitalopram (LEXAPRO) 20 MG tablet Take 1 tablet (20 mg total) by mouth daily. 30 tablet 2   estradiol (ESTRACE) 0.5 MG tablet Take 1 tablet (0.5 mg total) by mouth daily. 90 tablet 3   gabapentin (NEURONTIN) 300 MG capsule Take 1 capsule (300 mg total) by mouth 3 (three) times daily. 90 capsule 2   metFORMIN (GLUCOPHAGE) 500 MG tablet TAKE 1 TABLET (500 MG TOTAL) BY MOUTH DAILY WITH BREAKFAST. 90 tablet 0   omeprazole (PRILOSEC) 20 MG capsule TAKE 2 CAPSULES (40 MG TOTAL) BY MOUTH DAILY. 60 capsule 2   ondansetron (ZOFRAN-ODT) 8 MG disintegrating tablet Take 1 tablet (8 mg total) by mouth daily as needed for nausea or vomiting. 20 tablet 0   rosuvastatin (CRESTOR) 20  MG tablet TAKE 1 TABLET (20 MG TOTAL) BY MOUTH DAILY. TO LOWER CHOLESTEROL 30 tablet 2   tiZANidine (ZANAFLEX) 4 MG tablet START WITH 1/2 TABLET BY MOUTH EVERY 8 HOURS. MAY CAUSE DROWSINEES. INCREASE TO 1 TABLET EVERY 8 HOURS AS NEEDED FOR MUSCLE IF TOLERABLE. 90 tablet 0   traMADol (ULTRAM) 50 MG tablet Take 1 tablet (50 mg total) by mouth 3 (three) times daily as needed. 30 tablet 2   No current facility-administered medications for this visit.    Musculoskeletal: Strength & Muscle Tone: within normal limits Gait & Station: WDL Patient leans: NA  Psychiatric Specialty Exam: Review of Systems  Musculoskeletal:        Hand issues  Psychiatric/Behavioral:  Positive for dysphoric mood. The patient is nervous/anxious.   All other systems reviewed and are negative.  Last menstrual period 07/04/2017.There is no height or weight on file to calculate BMI.  General Appearance: Casual   Eye Contact:  Good  Speech:  Normal Rate  Volume:  Normal  Mood:  Anxious and Depressed  Affect:  Congruent  Thought Process:  Coherent and Descriptions of Associations: Intact  Orientation:  Full (Time, Place, and Person)  Thought Content:  WDL and Logical  Suicidal Thoughts:  No  Homicidal Thoughts:  No  Memory:  Immediate;   Good Recent;   Good Remote;   Good  Judgement:  Good  Insight:  Good  Psychomotor Activity:  Normal  Concentration:  Concentration: Good and Attention Span: Good  Recall:  Good  Fund of Knowledge:Good  Language: Good  Akathisia:  No  Handed:  Right  AIMS (if indicated):  not needed  Assets:  Housing Intimacy Leisure Time Resilience Social Support  ADL's:  Intact  Cognition: WNL  Sleep:  Fair   Screenings: AUDIT    Health and safety inspector from 02/25/2021 in [facility]  Alcohol Use Disorder Identification Test Final Score (AUDIT) 4      GAD-7    Flowsheet Row Office Visit from 04/08/2021 in [facility] for [place] [facility] Women Counselor from 02/25/2021 in [facility] Office Visit from 11/20/2020 in [place] Office Visit from 06/19/2020 in [place] Video Visit from 05/05/2020 in [facility]  Total GAD-7 Score 16 14 13 14 20       PHQ2-9    [place] Office Visit from 04/08/2021 in [facility] for [place] [facility] Counselor from 02/25/2021 in [facility] Office Visit from 11/20/2020 in [place] Office Visit from 06/19/2020 in [place] Video Visit from 06/09/2020 in [facility]  PHQ-2 Total Score 4 4 4 4  0  PHQ-9 Total Score 17 13 16 15  --      Flowsheet Row Counselor from 02/25/2021 in [facility] Ad[facility] (Discharged) from 07/30/2020 in [place] Video Visit from 06/09/2020 in [place] No Risk No Risk No Risk       Assessment and Plan:  General anxiety disorder and panic disorder: -Continue gabapentin 300 mg TID for anxiety and neuropathic pain  Major Depressive Disorder, recurrent, moderate -Continue Lexapro 20 mg daily -Continue therapy  Chronic back Pain: -Continue care with MD  Insomnia: -Decrease Seroquel 50 mg at bedtime PRN sleep for 25 mg daily  Virtual Visit via Video Note Follow up in 3 months  I connected with [name] on 04/28/21 at 10:30 AM EST by a video enabled telemedicine application and verified that I am speaking with the correct person using two identifiers.  Location: Patient: home Provider: home office     I discussed the limitations of evaluation and management by telemedicine and the availability of in person appointments. The patient expressed understanding  and agreed to proceed.  Follow Up Instructions: Follow up in 3 months   I discussed the assessment and treatment plan with the patient. The patient was provided an opportunity to ask questions and all were answered. The patient agreed with the plan and demonstrated an understanding of the instructions.   The patient was advised to call back or seek an in-person evaluation if the symptoms worsen or if the condition fails to improve as anticipated.  I provided 20 minutes of non-face-to-face time during this encounter.   Waylan Boga, NP  Waylan Boga, NP 1/10/202310:36 AM

## 2021-04-28 NOTE — Telephone Encounter (Signed)
Requested Prescriptions  Pending Prescriptions Disp Refills   rosuvastatin (CRESTOR) 20 MG tablet 30 tablet 2    Sig: TAKE 1 TABLET (20 MG TOTAL) BY MOUTH DAILY. TO LOWER CHOLESTEROL     Cardiovascular:  Antilipid - Statins Passed - 04/27/2021 11:18 PM      Passed - Total Cholesterol in normal range and within 360 days    Cholesterol, Total  Date Value Ref Range Status  11/24/2020 153 100 - 199 mg/dL Final         Passed - LDL in normal range and within 360 days    LDL Chol Calc (NIH)  Date Value Ref Range Status  11/24/2020 57 0 - 99 mg/dL Final         Passed - HDL in normal range and within 360 days    HDL  Date Value Ref Range Status  11/24/2020 73 >39 mg/dL Final         Passed - Triglycerides in normal range and within 360 days    Triglycerides  Date Value Ref Range Status  11/24/2020 138 0 - 149 mg/dL Final         Passed - Patient is not pregnant      Passed - Valid encounter within last 12 months    Recent Outpatient Visits          5 months ago Vasomotor symptoms due to menopause   [place], Charlane Ferretti, MD   6 months ago UTI symptoms   [place], [place] W, NP   10 months ago Other spondylosis with radiculopathy, cervical region   [place], Enobong, MD   11 months ago Smoking   [place], [place], [place]   1 year ago [place], Dionne Bucy, [place]      Future Appointments            In 1 week Dione Plover, Annice Needy, MD Center for [employer] at [facility] for [facility], [facility]   In 3 weeks Charlott Rakes, MD [place]            diclofenac (VOLTAREN) 50 MG EC tablet 60 tablet 2    Sig: TAKE 1 TABLET (50 MG TOTAL) BY MOUTH 2 (TWO) TIMES DAILY. AFTER A MEAL AS NEEDED FOR PAIN     Analgesics:  NSAIDS Passed -  04/27/2021 11:18 PM      Passed - Cr in normal range and within 360 days    Creat  Date Value Ref Range Status  11/27/2015 0.75 0.50 - 1.10 mg/dL Final   Creatinine, Ser  Date Value Ref Range Status  11/24/2020 0.83 0.57 - 1.00 mg/dL Final         Passed - HGB in normal range and within 360 days    Hemoglobin  Date Value Ref Range Status  07/30/2020 14.2 12.0 - 15.0 g/dL Final  05/28/2020 11.8 11.1 - 15.9 g/dL Final         Passed - Patient is not pregnant      Passed - Valid encounter within last 12 months    Recent Outpatient Visits          5 months ago Vasomotor symptoms due to menopause   [place], Charlane Ferretti, MD   6 months ago UTI symptoms  [place], [place] W, NP   10 months ago Other spondylosis with radiculopathy, cervical region   [place], Enobong, MD   11 months ago Smoking   [place], Dionne Bucy, [place]   1 year ago [place], Dionne Bucy, [place]      Future Appointments            In 1 week Dione Plover, Annice Needy, MD Center for [employer] at [facility] Women, [facility]   In 3 weeks Charlott Rakes, MD [place]            dicyclomine (BENTYL) 10 MG capsule 60 capsule 2    Sig: TAKE 1 CAPSULE BY MOUTH 2 TIMES DAILY AS NEEDED     Gastroenterology:  Antispasmodic Agents Passed - 04/27/2021 11:18 PM      Passed - Last Heart Rate in normal range    Pulse Readings from Last 1 Encounters:  04/08/21 73         Passed - Valid encounter within last 12 months    Recent Outpatient Visits          5 months ago Vasomotor symptoms due to menopause   [place], Enobong, MD   6 months ago UTI symptoms   [place], Vernia Buff, NP   10 months  ago Other spondylosis with radiculopathy, cervical region   [place], Enobong, MD   11 months ago Smoking   [place], [place], [place]   1 year ago [place], Dionne Bucy, [place]      Future Appointments            In 1 week Dione Plover, Annice Needy, MD Center for [employer] at [facility] for Women, [facility]   In 3 weeks Charlott Rakes, MD [place]            tiZANidine (ZANAFLEX) 4 MG tablet 90 tablet 0    Sig: START WITH 1/2 TABLET BY MOUTH EVERY 8 HOURS. MAY CAUSE DROWSINEES. INCREASE TO 1 TABLET EVERY 8 HOURS AS NEEDED FOR MUSCLE IF TOLERABLE.     Not Delegated - Cardiovascular:  Alpha-2 Agonists - tizanidine Failed - 04/27/2021 11:18 PM      Failed - This refill cannot be delegated      Passed - Valid encounter within last 6 months    Recent Outpatient Visits          5 months ago Vasomotor symptoms due to menopause   [place], Enobong, MD   6 months ago UTI symptoms   [place], Vernia Buff, NP   10 months ago Other spondylosis with radiculopathy, cervical region   [place], Enobong, MD   11 months ago Smoking   [place], [place], [place]   1 year ago [place], Dionne Bucy, [place]      Future Appointments            In 1 week Dione Plover Annice Needy, MD Center for [facility]  Healthcare at [facility] for [facility], [facility]   In 3 weeks Charlott Rakes, MD [place]

## 2021-04-29 ENCOUNTER — Other Ambulatory Visit: Payer: Self-pay | Admitting: Family Medicine

## 2021-04-29 ENCOUNTER — Other Ambulatory Visit: Payer: Self-pay

## 2021-04-29 DIAGNOSIS — M545 Low back pain, unspecified: Secondary | ICD-10-CM

## 2021-04-29 DIAGNOSIS — M542 Cervicalgia: Secondary | ICD-10-CM

## 2021-04-29 MED ORDER — TIZANIDINE HCL 4 MG PO TABS
ORAL_TABLET | ORAL | 0 refills | Status: DC
  Filled 2021-04-29 – 2021-05-25 (×4): qty 90, 30d supply, fill #0

## 2021-04-29 NOTE — Telephone Encounter (Signed)
Requested medication (s) are due for refill today: yes  Requested medication (s) are on the active medication list: yes    Last refill: 03/20/21  90  0 refills  Future visit scheduled yes 05/25/21  Notes to clinic:not delegated  Requested Prescriptions  Pending Prescriptions Disp Refills   tiZANidine (ZANAFLEX) 4 MG tablet 90 tablet 0    Sig: START WITH 1/2 TABLET BY MOUTH EVERY 8 HOURS. MAY CAUSE DROWSINEES. INCREASE TO 1 TABLET EVERY 8 HOURS AS NEEDED FOR MUSCLE IF TOLERABLE.     Not Delegated - Cardiovascular:  Alpha-2 Agonists - tizanidine Failed - 04/29/2021  8:25 AM      Failed - This refill cannot be delegated      Passed - Valid encounter within last 6 months    Recent Outpatient Visits           5 months ago Vasomotor symptoms due to menopause   [place], Enobong, MD   6 months ago UTI symptoms   [place], Vernia Buff, NP   10 months ago Other spondylosis with radiculopathy, cervical region   [place], Enobong, MD   11 months ago Smoking   [place], [place], [place]   1 year ago [place], Dionne Bucy, [place]       Future Appointments             In 1 week Dione Plover, Annice Needy, MD Center for [employer] at [facility] for Women, [facility]   In 3 weeks Charlott Rakes, MD [place]

## 2021-05-06 ENCOUNTER — Other Ambulatory Visit: Payer: Self-pay

## 2021-05-06 ENCOUNTER — Telehealth: Payer: No Typology Code available for payment source | Admitting: Family Medicine

## 2021-05-07 ENCOUNTER — Ambulatory Visit (HOSPITAL_COMMUNITY): Payer: No Payment, Other | Admitting: Licensed Clinical Social Worker

## 2021-05-07 ENCOUNTER — Encounter (HOSPITAL_COMMUNITY): Payer: Self-pay

## 2021-05-11 ENCOUNTER — Other Ambulatory Visit: Payer: Self-pay | Admitting: Physician Assistant

## 2021-05-11 ENCOUNTER — Other Ambulatory Visit: Payer: Self-pay | Admitting: Family Medicine

## 2021-05-11 DIAGNOSIS — R110 Nausea: Secondary | ICD-10-CM

## 2021-05-11 NOTE — Telephone Encounter (Signed)
Requested medications are due for refill today.  yes  Requested medications are on the active medications list.  yes  Last refill. 03/20/2021  Future visit scheduled.   yes  Notes to clinic.  Medication not delegated.    Requested Prescriptions  Pending Prescriptions Disp Refills   ondansetron (ZOFRAN-ODT) 8 MG disintegrating tablet 20 tablet 0    Sig: Take 1 tablet (8 mg total) by mouth daily as needed for nausea or vomiting.     Not Delegated - Gastroenterology: Antiemetics Failed - 05/11/2021  1:59 PM      Failed - This refill cannot be delegated      Passed - Valid encounter within last 6 months    Recent Outpatient Visits           5 months ago Vasomotor symptoms due to menopause   [place], [place], MD   7 months ago UTI symptoms   [place], Vernia Buff, NP   10 months ago Other spondylosis with radiculopathy, cervical region   [place], Enobong, MD   11 months ago Smoking   [place], [place], [place]   1 year ago [place], Dionne Bucy, [place]       Future Appointments             In 4 days Lanae Crumbly, PA-C [place]   In 2 weeks Charlott Rakes, MD [place]

## 2021-05-13 ENCOUNTER — Other Ambulatory Visit: Payer: Self-pay

## 2021-05-13 MED ORDER — ONDANSETRON 8 MG PO TBDP
8.0000 mg | ORAL_TABLET | Freq: Every day | ORAL | 0 refills | Status: DC | PRN
  Filled 2021-05-13 – 2021-05-25 (×2): qty 20, 20d supply, fill #0

## 2021-05-15 ENCOUNTER — Ambulatory Visit: Payer: No Typology Code available for payment source | Admitting: Surgery

## 2021-05-20 ENCOUNTER — Other Ambulatory Visit: Payer: Self-pay

## 2021-05-21 ENCOUNTER — Ambulatory Visit: Payer: Self-pay

## 2021-05-21 ENCOUNTER — Other Ambulatory Visit: Payer: Self-pay

## 2021-05-21 ENCOUNTER — Ambulatory Visit (INDEPENDENT_AMBULATORY_CARE_PROVIDER_SITE_OTHER): Payer: Self-pay | Admitting: Surgery

## 2021-05-21 ENCOUNTER — Encounter: Payer: Self-pay | Admitting: Surgery

## 2021-05-21 DIAGNOSIS — M5416 Radiculopathy, lumbar region: Secondary | ICD-10-CM

## 2021-05-21 DIAGNOSIS — G8929 Other chronic pain: Secondary | ICD-10-CM

## 2021-05-21 DIAGNOSIS — Z981 Arthrodesis status: Secondary | ICD-10-CM

## 2021-05-21 DIAGNOSIS — M545 Low back pain, unspecified: Secondary | ICD-10-CM

## 2021-05-21 DIAGNOSIS — M542 Cervicalgia: Secondary | ICD-10-CM

## 2021-05-21 NOTE — Progress Notes (Addendum)
Office Visit Note   Patient: Isabel Evans           Date of Birth: Jul 05, 1970           MRN: [id number] Visit Date: 05/21/2021              Requested by: Charlott Rakes, MD [place],  [place] [address] PCP: Charlott Rakes, MD   Assessment & Plan: Visit Diagnoses:  1. S/P cervical spinal fusion   2. Chronic midline low back pain without sciatica   3. Radiculopathy, lumbar region   4. Cervicalgia     Plan: Since patient continues have ongoing neck issues and low back issues that have failed conservative treatment up to this point I will schedule cervical and lumbar spine MRI scans.  Follow with Dr. Lorin Mercy in 3 weeks for recheck to discuss results.  Patient is also being followed in our office for bilateral hand Dupuytren's contractures.  She may be interested in another opinion.  I reached out to Dr. Roseanne Kaufman and will await his phone call.  Follow-Up Instructions: Return in about 3 weeks (around 06/11/2021) for WITH DR YATES TO REVIEW CERVICAL AND LUMBAR MRI SCANS.   Orders:  Orders Placed This Encounter  Procedures   XR Cervical Spine 2 or 3 views   XR Lumbar Spine 2-3 Views   MR Cervical Spine w/o contrast   MR Lumbar Spine w/o contrast   No orders of the defined types were placed in this encounter.     Procedures: No procedures performed   Clinical Data: No additional findings.   Subjective: Chief Complaint  Patient presents with   Neck - Pain   Lower Back - Pain    HPI 51 year old white female returns for recheck of her chronic neck pain and low back pain.  Patient is status post C5-6 ACDF by Dr. Lorin Mercy July 30, 2020.  She also has known issues with her lumbar spine and had previous MRI March 15, 2018 which showed:  CLINICAL DATA:  Lumbar radiculopathy.   EXAM: MRI LUMBAR SPINE WITHOUT CONTRAST   TECHNIQUE: Multiplanar, multisequence MR imaging of the lumbar spine was performed. No intravenous contrast was administered.    COMPARISON:  Lumbar radiographs 02/13/2018. No prior MRI for comparison.   FINDINGS: Segmentation:  Normal   Alignment:  Normal   Vertebrae:  Normal bone marrow.  Negative for fracture or mass.   Conus medullaris and cauda equina: Conus extends to the T12-L1 level. Conus and cauda equina appear normal.   Paraspinal and other soft tissues: Negative   Disc levels:   L1-2: Negative   L2-3: Right-sided disc protrusion. Subarticular and foraminal stenosis with impingement of the right L2 and L3 nerve roots. Mild spinal stenosis. Mild facet degeneration bilaterally.   L3-4: Mild facet degeneration. Negative for disc protrusion or stenosis   L4-5: Mild disc bulging. Small annular fissure on the right without focal disc protrusion. Bilateral facet hypertrophy. Mild subarticular stenosis bilaterally   L5-S1: Small left foraminal disc protrusion. Bilateral facet degeneration. Moderate left foraminal encroachment with impingement of the left L5 nerve root.   IMPRESSION: Right foraminal disc protrusion at L2-3 with subarticular foraminal stenosis on the right   Mild subarticular stenosis bilaterally L4-5   Small left foraminal disc protrusion with moderate left foraminal encroachment at L5-S1.     Electronically Signed   By: Franchot Gallo M.D.   On: 03/15/2018 11:56   Patient has gone through several weeks of formal PT  without any improvement.  States that PT actually increased her low back symptoms.  She is also failed conservative management with medications. Review of Systems No current cardiopulmonary GI GU issues  Objective: Vital Signs: LMP 07/04/2017   Physical Exam HENT:     Head: Normocephalic.     Nose: Nose normal.  Eyes:     Extraocular Movements: Extraocular movements intact.  Musculoskeletal:     Comments: Gait is somewhat antalgic.  Neck she has some limitation range of motion due to discomfort.  Positive bilateral brachial plexus trapezius and  scapular tenderness.  Positive bilateral lumbar paraspinal tenderness.  Negative logroll bilateral hips.  Positive bilateral straight leg raise.  No focal motor deficits.  Neurological:     General: No focal deficit present.     Mental Status: She is alert and oriented to person, place, and time.    Ortho Exam  Specialty Comments:  No specialty comments available.  Imaging: No results found.   PMFS History: Patient Active Problem List   Diagnosis Date Noted   Family history of cancer 04/08/2021   Menopause 04/08/2021   S/P cervical spinal fusion 09/16/2020   Cervical spinal stenosis 07/30/2020   Other spondylosis with radiculopathy, cervical region 06/18/2020   Major depressive disorder, recurrent episode, moderate (HCC) 05/26/2020   Protrusion of cervical intervertebral disc 03/22/2018   Anemia 11/24/2017   Pre-diabetes 11/24/2017   Blurry vision, bilateral 05/18/2017   Callus of foot 05/18/2017   Pain in joint of left hip 02/16/2017   Bruises easily 11/10/2016   Gastroesophageal reflux disease without esophagitis 09/22/2016   Fatigue associated with anemia 09/22/2016   Flexion contractures 09/22/2016   Generalized anxiety disorder 01/01/2016   Dental abscess 01/01/2016   Healthcare maintenance 05/08/2015   Adjustment disorder with mixed anxiety and depressed mood 05/08/2015   Stress at home 10/24/2014   Irritable bowel syndrome 10/24/2014   Anxiety state 10/24/2014   Preop testing 09/19/2014   Pap smear for cervical cancer screening 09/19/2014   Midline low back pain without sciatica 08/12/2014   [place] nodes 08/12/2014   Contracture of joint, hand 08/12/2014   Essential hypertension 02/11/2014   Allergy 02/11/2014   Screening for breast cancer 02/11/2014   Dupuytren's contracture of both hands 11/15/2013   Hallux valgus of right foot 11/15/2013   Back pain 05/21/2013   UTI (urinary tract infection) 05/21/2013   Multiple skin nodules 11/27/2012   Fibromyalgia  09/08/2012   Osteoarthritis 09/08/2012   Neck muscle spasm 07/14/2012   Plantar wart 07/14/2012   Insomnia 07/14/2012   Past Medical History:  Diagnosis Date   Anemia 11/24/2017   Anxiety    Bone spur    cervical spine   Chronic kidney disease 04/2017   kidney infections   Depression    Deviated nasal septum    states is unable to lie flat, because her nose will become congested and she can stop breathing   Diabetes mellitus without complication (HCC)    Dysrhythmia    heart flutters occasionally   Fibromyalgia    Fibromyalgia    GERD (gastroesophageal reflux disease)    Hallux abductovalgus with bunions 09/2014   right    Hammertoe 09/2014   right 4th, 5th   History of stomach ulcers    Hyperlipidemia    Hypertension    states is under control with med., has been on med. x 8 mos.   IBS (irritable bowel syndrome)    no current med.   Osteoarthritis  hands, bilateral hips   Peripheral vascular disease (HCC)    occasional swelling in both legs   Sinus headache     Family History  Problem Relation Age of Onset   Hypertension Mother    Diabetes Mother    Heart disease Mother    Hypertension Father    Diabetes Father    Prostate cancer Father    Heart disease Father    Alcohol abuse Father    Hypertension Sister    Diabetes Sister    Heart disease Maternal Grandmother        Great GM   Breast cancer Maternal Grandmother    Bone cancer Maternal Grandmother    Breast cancer Maternal Aunt    Ovarian cancer Maternal Aunt    Rheum arthritis Sister    Colon cancer Other        great Aunt   Rectal cancer Neg Hx    Stomach cancer Neg Hx     Past Surgical History:  Procedure Laterality Date   ABDOMINAL HYSTERECTOMY     ANTERIOR CERVICAL DECOMP/DISCECTOMY FUSION N/A 07/30/2020   Procedure: C5-6 ANTERIOR CERVICAL DECOMPRESSION/DISCECTOMY FUSION, ALLOGRAFT, PLATE;  Surgeon: Marybelle Killings, MD;  Location: [facility];  Service: Orthopedics;  Laterality: N/A;   BREAST BIOPSY  Left 04/2018   benign   BREAST BIOPSY Left 2016   benign   BUNIONECTOMY Right 10/04/2014   Procedure:  [place], Emmaline Life;  Surgeon: Trula Slade, DPM;  Location: [facility];  Service: Podiatry;  Laterality: Right;   COLONOSCOPY WITH PROPOFOL  02/13/2013   CYSTOSCOPY N/A 07/07/2017   Procedure: CYSTOSCOPY;  Surgeon: Aletha Halim, MD;  Location: [facility];  Service: Gynecology;  Laterality: N/A;   DUPUYTREN / PALMAR FASCIOTOMY Right 07/18/2013   exc. 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   small finger   HAMMER TOE SURGERY Right 10/04/2014   Procedure: HAMMER TOE REPAIR 4TH  AND 5TH  RIGHT FOOT  fourth toe fixation with k wire;  Surgeon: Trula Slade, DPM;  Location: [facility];  Service: Podiatry;  Laterality: Right;   LEG SURGERY Left    as a child; near amputation of leg   TUBAL LIGATION     VAGINAL HYSTERECTOMY N/A 07/07/2017   Procedure: HYSTERECTOMY VAGINAL;  Surgeon: Aletha Halim, MD;  Location: [facility];  Service: Gynecology;  Laterality: N/A;   Social History   Occupational History   Occupation: cook  Tobacco Use   Smoking status: Former    Years: 20.00    Types: Cigarettes, E-cigarettes    Start date: 01/19/2021    Quit date: 08/24/2012    Years since quitting: 8.7   Smokeless tobacco: Never  Vaping Use   Vaping Use: Every day   Substances: Flavoring  Substance and Sexual Activity   Alcohol use: Yes    Comment: occasionally   Drug use: Yes    Types: Marijuana    Comment: occ.   Sexual activity: Yes    Partners: Male    Birth control/protection: Surgical    Comment: 1 partner

## 2021-05-25 ENCOUNTER — Encounter: Payer: Self-pay | Admitting: Family Medicine

## 2021-05-25 ENCOUNTER — Other Ambulatory Visit: Payer: Self-pay

## 2021-05-25 ENCOUNTER — Ambulatory Visit: Payer: Self-pay | Attending: Family Medicine | Admitting: Family Medicine

## 2021-05-25 ENCOUNTER — Other Ambulatory Visit: Payer: Self-pay | Admitting: Family Medicine

## 2021-05-25 VITALS — BP 120/84 | HR 84 | Ht 69.0 in | Wt 155.8 lb

## 2021-05-25 DIAGNOSIS — G8929 Other chronic pain: Secondary | ICD-10-CM

## 2021-05-25 DIAGNOSIS — K219 Gastro-esophageal reflux disease without esophagitis: Secondary | ICD-10-CM

## 2021-05-25 DIAGNOSIS — H539 Unspecified visual disturbance: Secondary | ICD-10-CM

## 2021-05-25 DIAGNOSIS — Z23 Encounter for immunization: Secondary | ICD-10-CM

## 2021-05-25 DIAGNOSIS — M720 Palmar fascial fibromatosis [Dupuytren]: Secondary | ICD-10-CM

## 2021-05-25 DIAGNOSIS — R7303 Prediabetes: Secondary | ICD-10-CM

## 2021-05-25 DIAGNOSIS — M545 Low back pain, unspecified: Secondary | ICD-10-CM

## 2021-05-25 DIAGNOSIS — M4722 Other spondylosis with radiculopathy, cervical region: Secondary | ICD-10-CM

## 2021-05-25 DIAGNOSIS — Z1159 Encounter for screening for other viral diseases: Secondary | ICD-10-CM

## 2021-05-25 DIAGNOSIS — F411 Generalized anxiety disorder: Secondary | ICD-10-CM

## 2021-05-25 LAB — POCT GLYCOSYLATED HEMOGLOBIN (HGB A1C): HbA1c, POC (controlled diabetic range): 5.4 % (ref 0.0–7.0)

## 2021-05-25 MED ORDER — LIDOCAINE 5 % EX PTCH
1.0000 | MEDICATED_PATCH | CUTANEOUS | 6 refills | Status: DC
Start: 2021-05-25 — End: 2022-01-26
  Filled 2021-05-25: qty 30, 30d supply, fill #0
  Filled 2021-06-17: qty 30, 30d supply, fill #1
  Filled 2021-07-27: qty 30, 30d supply, fill #2
  Filled 2021-09-04: qty 30, 30d supply, fill #3
  Filled 2021-10-06 – 2021-11-27 (×3): qty 30, 30d supply, fill #4
  Filled 2021-12-30: qty 30, 30d supply, fill #5

## 2021-05-25 NOTE — Patient Instructions (Signed)
Cervical Radiculopathy ?Cervical radiculopathy means that a nerve in the neck (a cervical nerve) is pinched or bruised. This can happen because of an injury to the cervical spine (vertebrae) in the neck, or as a normal part of getting older. This condition can cause pain or loss of feeling (numbness) that runs from your neck all the way down to your arm and fingers. Often, this condition gets better with rest. Treatment may be needed if the condition does not get better. ?What are the causes? ?A neck injury. ?A bulging disk in your spine. ?Sudden muscle tightening (muscle spasms). ?Tight muscles in your neck due to overuse. ?Arthritis. ?Breakdown in the bones and joints of the spine (spondylosis) due to getting older. ?Bone spurs that form near the nerves in the neck. ?What are the signs or symptoms? ?Pain. The pain may: ?Run from the neck to the arm and hand. ?Be very bad or irritating. ?Get worse when you move your neck. ?Loss of feeling or tingling in your arm or hand. ?Weakness in your arm or hand, in very bad cases. ?How is this treated? ?In many cases, treatment is not needed for this condition. With rest, the condition often gets better over time. If treatment is needed, options may include: ?Wearing a soft neck collar (cervical collar) for short periods of time. ?Doing exercises (physical therapy) to strengthen your neck muscles. ?Taking medicines. ?Having shots (injections) in your spine, in very bad cases. ?Having surgery. This may be needed if other treatments do not help. The type of surgery that is used will depend on the cause of your condition. ?Follow these instructions at home: ?If you have a soft neck collar: ?Wear it as told by your doctor. Take it off only as told by your doctor. ?Ask your doctor if you can take the collar off for cleaning and bathing. If you are allowed to take the collar off for cleaning or bathing: ?Follow instructions from your doctor about how to take off the collar  safely. ?Clean the collar by wiping it with mild soap and water and drying it completely. ?Take out any removable pads in the collar every 1-2 days. Wash them by hand with soap and water. Let them air-dry completely before you put them back in the collar. ?Check your skin under the collar for redness or sores. If you see any, tell your doctor. ?Managing pain ?  ?Take over-the-counter and prescription medicines only as told by your doctor. ?If told, put ice on the painful area. To do this: ?If you have a soft neck collar, take if off as told by your doctor. ?Put ice in a plastic bag. ?Place a towel between your skin and the bag. ?Leave the ice on for 20 minutes, 2-3 times a day. ?Take off the ice if your skin turns bright red. This is very important. If you cannot feel pain, heat, or cold, you have a greater risk of damage to the area. ?If using ice does not help, you can try using heat. Use the heat source that your doctor recommends, such as a moist heat pack or a heating pad. ?Place a towel between your skin and the heat source. ?Leave the heat on for 20-30 minutes. ?Take off the heat if your skin turns bright red. This is very important. If you cannot feel pain, heat, or cold, you have a greater risk of getting burned. ?You may try a gentle neck and shoulder rub (massage). ?Activity ?Rest as needed. ?Return to your normal   activities when your doctor says that it is safe. ?Do exercises as told by your doctor or physical therapist. ?You may have to avoid lifting. Ask your doctor how much you can safely lift. ?General instructions ?Use a flat pillow when you sleep. ?Do not drive while wearing a soft neck collar. If you do not have a soft neck collar, ask your doctor if it is safe to drive while your neck heals. ?Ask your doctor if you should avoid driving or using machines while you are taking your medicine. ?Do not smoke or use any products that contain nicotine or tobacco. If you need help quitting, ask your  doctor. ?Keep all follow-up visits. ?Contact a doctor if: ?Your condition does not get better with treatment. ?Get help right away if: ?Your pain gets worse and medicine does not help. ?You lose feeling or feel weak in your hand, arm, face, or leg. ?You have a high fever. ?Your neck is stiff. ?You cannot control when you poop or pee (have incontinence). ?You have trouble with walking, balance, or talking. ?Summary ?Cervical radiculopathy means that a nerve in the neck is pinched or bruised. ?A nerve can get pinched from a bulging disk, arthritis, an injury to the neck, or other causes. ?Symptoms include pain, tingling, or loss of feeling that goes from the neck to the arm or hand. ?Weakness in your arm or hand can happen in very bad cases. ?Treatment may include resting, wearing a soft neck collar, and doing exercises. You might need to take medicines for pain. In very bad cases, shots or surgery may be needed. ?This information is not intended to replace advice given to you by your health care provider. Make sure you discuss any questions you have with your health care provider. ?Document Revised: 10/09/2020 Document Reviewed: 10/09/2020 ?Elsevier Patient Education ? 2022 [employer] ? ?

## 2021-05-25 NOTE — Progress Notes (Signed)
Subjective:  Patient ID: Isabel Evans, female    DOB: 04-Jul-1970  Age: 51 y.o. MRN: [id number]  CC: Hypertension   HPI Isabel Evans is a 51 y.o. year old female with a history of Anxiety and Depression, spondylosis with cervical radiculopathy (status post cervical spine fusion), Dupuytren's contracture, chronic low back pain.    Interval History: Her neck and lower back continue to hurt and her Spine specialist will be working on ordering additional imaging for her. She has completed PT as well with no much improvement.  She had a visit 3 days ago with her orthopedic who prescribes Tramadol, Tizanidine. Her whole spine hurts she states.  Also complains of worsening pain in hands from Dupuytrens contracture. She previously had release surgery in her R hand but this caused contractures to worsen.  Her vision is getting worse and she needs reading glasses She applied for disability 2 months ago. Trying to work part time but it is difficult with all she is dealing and this causes her to Depressed. Her Psychiatrist is Dr Reita Cliche. Spine Dr is Dr Lorin Mercy.  Past Medical History:  Diagnosis Date   Anemia 11/24/2017   Anxiety    Bone spur    cervical spine   Chronic kidney disease 04/2017   kidney infections   Depression    Deviated nasal septum    states is unable to lie flat, because her nose will become congested and she can stop breathing   Diabetes mellitus without complication (HCC)    Dysrhythmia    heart flutters occasionally   Fibromyalgia    Fibromyalgia    GERD (gastroesophageal reflux disease)    Hallux abductovalgus with bunions 09/2014   right    Hammertoe 09/2014   right 4th, 5th   History of stomach ulcers    Hyperlipidemia    Hypertension    states is under control with med., has been on med. x 8 mos.   IBS (irritable bowel syndrome)    no current med.   Osteoarthritis    hands, bilateral hips   Peripheral vascular disease (HCC)    occasional swelling in  both legs   Sinus headache     Past Surgical History:  Procedure Laterality Date   ABDOMINAL HYSTERECTOMY     ANTERIOR CERVICAL DECOMP/DISCECTOMY FUSION N/A 07/30/2020   Procedure: C5-6 ANTERIOR CERVICAL DECOMPRESSION/DISCECTOMY FUSION, ALLOGRAFT, PLATE;  Surgeon: Marybelle Killings, MD;  Location: [facility];  Service: Orthopedics;  Laterality: N/A;   BREAST BIOPSY Left 04/2018   benign   BREAST BIOPSY Left 2016   benign   BUNIONECTOMY Right 10/04/2014   Procedure:  [place], Emmaline Life;  Surgeon: Trula Slade, DPM;  Location: [facility];  Service: Podiatry;  Laterality: Right;   COLONOSCOPY WITH PROPOFOL  02/13/2013   CYSTOSCOPY N/A 07/07/2017   Procedure: CYSTOSCOPY;  Surgeon: Aletha Halim, MD;  Location: [facility];  Service: Gynecology;  Laterality: N/A;   DUPUYTREN / PALMAR FASCIOTOMY Right 07/18/2013   exc. 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   small finger   HAMMER TOE SURGERY Right 10/04/2014   Procedure: HAMMER TOE REPAIR 4TH  AND 5TH  RIGHT FOOT  fourth toe fixation with k wire;  Surgeon: Trula Slade, DPM;  Location: [facility];  Service: Podiatry;  Laterality: Right;   LEG SURGERY Left    as a child; near amputation of leg   TUBAL LIGATION     VAGINAL HYSTERECTOMY  N/A 07/07/2017   Procedure: HYSTERECTOMY VAGINAL;  Surgeon: Aletha Halim, MD;  Location: [facility];  Service: Gynecology;  Laterality: N/A;    Family History  Problem Relation Age of Onset   Hypertension Mother    Diabetes Mother    Heart disease Mother    Hypertension Father    Diabetes Father    Prostate cancer Father    Heart disease Father    Alcohol abuse Father    Hypertension Sister    Diabetes Sister    Heart disease Maternal Grandmother        Great GM   Breast cancer Maternal Grandmother    Bone cancer Maternal Grandmother    Breast cancer Maternal Aunt    Ovarian cancer Maternal Aunt    Rheum arthritis Sister     Colon cancer Other        great Aunt   Rectal cancer Neg Hx    Stomach cancer Neg Hx     No Known Allergies  Outpatient Medications Prior to Visit  Medication Sig Dispense Refill   diclofenac (VOLTAREN) 50 MG EC tablet TAKE 1 TABLET (50 MG TOTAL) BY MOUTH 2 (TWO) TIMES DAILY. AFTER A MEAL AS NEEDED FOR PAIN 60 tablet 2   dicyclomine (BENTYL) 10 MG capsule TAKE 1 CAPSULE BY MOUTH 2 TIMES DAILY AS NEEDED 60 capsule 2   diphenhydrAMINE (BENADRYL) 25 MG tablet Take 1 tablet (25 mg total) by mouth daily. 30 tablet 0   escitalopram (LEXAPRO) 20 MG tablet Take 1 tablet (20 mg total) by mouth daily. 30 tablet 2   estradiol (ESTRACE) 0.5 MG tablet Take 1 tablet (0.5 mg total) by mouth daily. 90 tablet 3   gabapentin (NEURONTIN) 300 MG capsule Take 1 capsule (300 mg total) by mouth 3 (three) times daily. 90 capsule 2   metFORMIN (GLUCOPHAGE) 500 MG tablet TAKE 1 TABLET (500 MG TOTAL) BY MOUTH DAILY WITH BREAKFAST. 90 tablet 0   omeprazole (PRILOSEC) 20 MG capsule TAKE 2 CAPSULES (40 MG TOTAL) BY MOUTH DAILY. 60 capsule 2   ondansetron (ZOFRAN-ODT) 8 MG disintegrating tablet Take 1 tablet (8 mg total) by mouth daily as needed for nausea or vomiting. 20 tablet 0   QUEtiapine (SEROQUEL) 50 MG tablet Take 0.5 tablets (25 mg total) by mouth at bedtime. 30 tablet 2   rosuvastatin (CRESTOR) 20 MG tablet TAKE 1 TABLET (20 MG TOTAL) BY MOUTH DAILY. TO LOWER CHOLESTEROL 30 tablet 2   tiZANidine (ZANAFLEX) 4 MG tablet START WITH 1/2 TABLET BY MOUTH EVERY 8 HOURS. MAY CAUSE DROWSINEES. INCREASE TO 1 TABLET EVERY 8 HOURS AS NEEDED FOR MUSCLE IF TOLERABLE. 90 tablet 0   traMADol (ULTRAM) 50 MG tablet Take 1 tablet (50 mg total) by mouth 3 (three) times daily as needed. 30 tablet 2   No facility-administered medications prior to visit.     ROS Review of Systems  Constitutional:  Negative for activity change, appetite change and fatigue.  HENT:  Negative for congestion, sinus pressure and sore throat.    Eyes:  Positive for visual disturbance.  Respiratory:  Negative for cough, chest tightness, shortness of breath and wheezing.   Cardiovascular:  Negative for chest pain and palpitations.  Gastrointestinal:  Negative for abdominal distention, abdominal pain and constipation.  Endocrine: Negative for polydipsia.  Genitourinary:  Negative for dysuria and frequency.  Musculoskeletal:  Positive for back pain and neck pain. Negative for arthralgias.  Skin:  Negative for rash.  Neurological:  Negative for tremors, light-headedness and numbness.  Hematological:  Does not bruise/bleed easily.  Psychiatric/Behavioral:  Negative for agitation and behavioral problems.    Objective:  BP 120/84    Pulse 84    Ht 5\' 9"  (1.753 m)    Wt 155 lb 12.8 oz (70.7 kg)    LMP 07/04/2017    SpO2 99%    BMI 23.01 kg/m   BP/Weight 05/25/2021 04/08/2021 83/04/5174  Systolic BP 160 737 106  Diastolic BP 84 84 93  Wt. (Lbs) 155.8 152.2 157  BMI 23.01 22.48 22.85  Some encounter information is confidential and restricted. Go to Review Flowsheets activity to see all data.      Physical Exam Constitutional:      Appearance: She is well-developed.  Neck:     Comments: Tenderness on palpation of base of neck and lateral aspects of neck with associated tenderness on range of motion Cardiovascular:     Rate and Rhythm: Normal rate.     Heart sounds: Normal heart sounds. No murmur heard. Pulmonary:     Effort: Pulmonary effort is normal.     Breath sounds: Normal breath sounds. No wheezing or rales.  Chest:     Chest wall: No tenderness.  Abdominal:     General: Bowel sounds are normal. There is no distension.     Palpations: Abdomen is soft. There is no mass.     Tenderness: There is no abdominal tenderness.  Musculoskeletal:     Right lower leg: No edema.     Left lower leg: No edema.     Comments: Contractures in both hands Tenderness on palpation of lumbar spine Positive straight leg raise bilaterally   Neurological:     Mental Status: She is alert and oriented to person, place, and time.  Psychiatric:        Mood and Affect: Mood normal.    CMP Latest Ref Rng & Units 11/24/2020 07/30/2020 06/17/2020  Glucose 65 - 99 mg/dL 95 92 78  BUN 6 - 24 mg/dL 12 8 12   Creatinine 0.57 - 1.00 mg/dL 0.83 0.76 0.83  Sodium 134 - 144 mmol/L 140 139 138  Potassium 3.5 - 5.2 mmol/L 4.0 4.6 4.1  Chloride 96 - 106 mmol/L 102 105 99  CO2 20 - 29 mmol/L 25 26 24   Calcium 8.7 - 10.2 mg/dL 9.5 9.3 9.6  Total Protein 6.0 - 8.5 g/dL 7.0 - 7.4  Total Bilirubin 0.0 - 1.2 mg/dL 0.4 - 0.3  Alkaline Phos 44 - 121 IU/L 63 - 60  AST 0 - 40 IU/L 18 - 17  ALT 0 - 32 IU/L 15 - 22    Lipid Panel     Component Value Date/Time   CHOL 153 11/24/2020 0915   TRIG 138 11/24/2020 0915   HDL 73 11/24/2020 0915   CHOLHDL 2.1 11/24/2020 0915   LDLCALC 57 11/24/2020 0915    CBC    Component Value Date/Time   WBC 6.7 07/30/2020 1007   RBC 4.58 07/30/2020 1007   HGB 14.2 07/30/2020 1007   HGB 11.8 05/28/2020 1626   HCT 42.4 07/30/2020 1007   HCT 35.3 05/28/2020 1626   PLT 234 07/30/2020 1007   PLT 257 05/28/2020 1626   MCV 92.6 07/30/2020 1007   MCV 89 05/28/2020 1626   MCH 31.0 07/30/2020 1007   MCHC 33.5 07/30/2020 1007   RDW 12.8 07/30/2020 1007   RDW 12.7 05/28/2020 1626   LYMPHSABS 3.1 05/28/2020 1626   MONOABS 0.5 03/24/2020 1228   EOSABS 0.1 05/28/2020  1626   BASOSABS 0.1 05/28/2020 1626    Lab Results  Component Value Date   HGBA1C 5.4 05/25/2021    Vision Screening   Right eye Left eye Both eyes  Without correction 20/40 20/50   With correction       Assessment & Plan:  1. Pre-diabetes Controlled with A1c of 5.6 Continue metformin - POCT glycosylated hemoglobin (Hb H7C) - Basic Metabolic Panel  2. Dupuytren's contracture of both hands Status post right hand release surgery Symptoms uncontrolled on current regimen Continue to follow-up with hand surgeon  3. Other spondylosis with  radiculopathy, cervical region Status post cervical spine fusion Uncontrolled We will add Lidoderm to her regimen Follow-up with orthopedic - lidocaine (LIDODERM) 5 %; Place 1 patch onto the skin daily. Remove & Discard patch within 12 hours or as directed by MD  Dispense: 30 patch; Refill: 6  4. Chronic midline low back pain without sciatica See #3 above - lidocaine (LIDODERM) 5 %; Place 1 patch onto the skin daily. Remove & Discard patch within 12 hours or as directed by MD  Dispense: 30 patch; Refill: 6  5. Anxiety state Currently on Lexapro, Seroquel Cymbalta would have been a good choice due to ongoing pain and blood.  Patient she was unable to tolerate Cymbalta previously  6. Need for hepatitis C screening test - HCV Ab w Reflex to Quant PCR  7. Vision abnormalities Vision in right eye is 20/40, left eye is 20/50 Discussed with available community resources for eye exam given she has no medical coverage for referral   Meds ordered this encounter  Medications   lidocaine (LIDODERM) 5 %    Sig: Place 1 patch onto the skin daily. Remove & Discard patch within 12 hours or as directed by MD    Dispense:  30 patch    Refill:  6    Follow-up: Return in about 6 months (around 11/22/2021) for Chronic medical conditions.       Charlott Rakes, MD, FAAFP. [facility] [place], [place]   05/25/2021, 12:42 PM

## 2021-05-25 NOTE — Progress Notes (Signed)
Having pain in back and neck. Need medication refills. Eye referral

## 2021-05-26 ENCOUNTER — Other Ambulatory Visit: Payer: Self-pay

## 2021-05-26 LAB — BASIC METABOLIC PANEL
BUN/Creatinine Ratio: 12 (ref 9–23)
BUN: 9 mg/dL (ref 6–24)
CO2: 26 mmol/L (ref 20–29)
Calcium: 9.8 mg/dL (ref 8.7–10.2)
Chloride: 101 mmol/L (ref 96–106)
Creatinine, Ser: 0.73 mg/dL (ref 0.57–1.00)
Glucose: 103 mg/dL — ABNORMAL HIGH (ref 70–99)
Potassium: 4.4 mmol/L (ref 3.5–5.2)
Sodium: 140 mmol/L (ref 134–144)
eGFR: 101 mL/min/{1.73_m2} (ref >59)

## 2021-05-26 LAB — HCV AB W REFLEX TO QUANT PCR: HCV Ab: <0.1 s/co ratio (ref 0.0–0.9)

## 2021-05-26 LAB — HCV INTERPRETATION

## 2021-05-26 MED ORDER — OMEPRAZOLE 20 MG PO CPDR
40.0000 mg | DELAYED_RELEASE_CAPSULE | Freq: Every day | ORAL | 2 refills | Status: DC
  Filled 2021-05-26: qty 60, 30d supply, fill #0
  Filled 2021-06-17: qty 60, 30d supply, fill #1
  Filled 2021-07-27: qty 60, 30d supply, fill #2

## 2021-05-26 MED ORDER — METFORMIN HCL 500 MG PO TABS
ORAL_TABLET | Freq: Every day | ORAL | 3 refills | Status: DC
  Filled 2021-05-26: qty 30, 30d supply, fill #0
  Filled 2021-06-17: qty 30, 30d supply, fill #1
  Filled 2021-07-27: qty 30, 30d supply, fill #2
  Filled 2021-09-04: qty 30, 30d supply, fill #3

## 2021-05-26 NOTE — Telephone Encounter (Signed)
Requested medication (s) are due for refill today:   Yes for both  Requested medication (s) are on the active medication list:   Yes for both  Future visit scheduled:   No   Just seen yesterday (2/6)   Last ordered: Metformin 02/20/2021 #90, 0 refills;  omeprazole 02/20/2021 #60, 2 refills  Returned because protocol criteria not met.   No B12 level    Requested Prescriptions  Pending Prescriptions Disp Refills   metFORMIN (GLUCOPHAGE) 500 MG tablet 90 tablet 0    Sig: TAKE 1 TABLET (500 MG TOTAL) BY MOUTH DAILY WITH BREAKFAST.     Endocrinology:  Diabetes - Biguanides Failed - 05/25/2021 11:14 AM      Failed - B12 Level in normal range and within 720 days    No results found for: VITAMINB12        Failed - CBC within normal limits and completed in the last 12 months    WBC  Date Value Ref Range Status  07/30/2020 6.7 4.0 - 10.5 K/uL Final   RBC  Date Value Ref Range Status  07/30/2020 4.58 3.87 - 5.11 MIL/uL Final   Hemoglobin  Date Value Ref Range Status  07/30/2020 14.2 12.0 - 15.0 g/dL Final  45/18/6210 90.2 11.1 - 15.9 g/dL Final   HCT  Date Value Ref Range Status  07/30/2020 42.4 36.0 - 46.0 % Final   Hematocrit  Date Value Ref Range Status  05/28/2020 35.3 34.0 - 46.6 % Final   MCHC  Date Value Ref Range Status  07/30/2020 33.5 30.0 - 36.0 g/dL Final   [facility]  Date Value Ref Range Status  07/30/2020 31.0 26.0 - 34.0 pg Final   MCV  Date Value Ref Range Status  07/30/2020 92.6 80.0 - 100.0 fL Final  05/28/2020 89 79 - 97 fL Final   No results found for: PLTCOUNTKUC, LABPLAT, POCPLA RDW  Date Value Ref Range Status  07/30/2020 12.8 11.5 - 15.5 % Final  05/28/2020 12.7 11.7 - 15.4 % Final         Passed - Cr in normal range and within 360 days    Creat  Date Value Ref Range Status  11/27/2015 0.75 0.50 - 1.10 mg/dL Final   Creatinine, Ser  Date Value Ref Range Status  05/25/2021 0.73 0.57 - 1.00 mg/dL Final          Passed - HBA1C is between 0  and 7.9 and within 180 days    HbA1c, POC (controlled diabetic range)  Date Value Ref Range Status  05/25/2021 5.4 0.0 - 7.0 % Final          Passed - eGFR in normal range and within 360 days    GFR calc Af Amer  Date Value Ref Range Status  05/28/2020 69 >59 mL/min/1.73 Final    Comment:    **In accordance with recommendations from the NKF-ASN Task force,**   [facility] is in the process of updating its eGFR calculation to the   2021 CKD-EPI creatinine equation that estimates kidney function   without a race variable.    GFR, Estimated  Date Value Ref Range Status  07/30/2020 >60 >60 mL/min Final    Comment:    (NOTE) Calculated using the CKD-EPI Creatinine Equation (2021)    GFR  Date Value Ref Range Status  03/24/2020 89.57 >60.00 mL/min Final    Comment:    Calculated using the CKD-EPI Creatinine Equation (2021)   eGFR  Date Value Ref Range Status  05/25/2021 101 >59 mL/min/1.73 Final          Passed - Valid encounter within last 6 months    Recent Outpatient Visits           [place], [place], MD   6 months ago Vasomotor symptoms due to menopause   [place], [place], MD   7 months ago UTI symptoms   [place], Vernia Buff, NP   11 months ago Other spondylosis with radiculopathy, cervical region   [place], [place], MD   12 months ago Smoking   [place], Levada Dy M, [place]       Future Appointments             In 3 weeks Marybelle Killings, MD [place]             omeprazole (PRILOSEC) 20 MG capsule 60 capsule 2    Sig: TAKE 2 CAPSULES (40 MG TOTAL) BY MOUTH DAILY.     Gastroenterology: Proton Pump Inhibitors Passed - 05/25/2021 11:14 AM      Passed - Valid encounter within last 12 months    Recent Outpatient Visits            [place], Charlane Ferretti, MD   6 months ago Vasomotor symptoms due to menopause   [place], [place]ong, MD   7 months ago UTI symptoms   [place] Gildardo Pounds, NP   11 months ago Other spondylosis with radiculopathy, cervical region   [place], [place], MD   12 months ago [place], Dionne Bucy, [place]       Future Appointments             In 3 weeks Marybelle Killings, MD [facility]

## 2021-05-27 ENCOUNTER — Other Ambulatory Visit: Payer: Self-pay

## 2021-06-04 NOTE — Addendum Note (Signed)
Addended by: Lanae Crumbly on: 06/04/2021 01:58 PM   Modules accepted: Orders

## 2021-06-16 ENCOUNTER — Ambulatory Visit: Payer: No Typology Code available for payment source | Admitting: Orthopaedic Surgery

## 2021-06-17 ENCOUNTER — Other Ambulatory Visit: Payer: Self-pay | Admitting: Family Medicine

## 2021-06-17 DIAGNOSIS — M542 Cervicalgia: Secondary | ICD-10-CM

## 2021-06-17 DIAGNOSIS — M545 Low back pain, unspecified: Secondary | ICD-10-CM

## 2021-06-17 DIAGNOSIS — R110 Nausea: Secondary | ICD-10-CM

## 2021-06-18 ENCOUNTER — Other Ambulatory Visit: Payer: Self-pay

## 2021-06-18 MED ORDER — ONDANSETRON 8 MG PO TBDP
8.0000 mg | ORAL_TABLET | Freq: Every day | ORAL | 0 refills | Status: DC | PRN
  Filled 2021-06-18: qty 20, 20d supply, fill #0

## 2021-06-18 MED ORDER — TIZANIDINE HCL 4 MG PO TABS
ORAL_TABLET | ORAL | 0 refills | Status: DC
  Filled 2021-06-18: qty 90, 30d supply, fill #0

## 2021-06-18 NOTE — Telephone Encounter (Signed)
Requested medication (s) are due for refill today: yes ? ?Requested medication (s) are on the active medication list: yes ? ?Last refill:  zofran 05/13/21 #20/0, zanaflex 04/29/21 #90/0 ? ?Future visit scheduled: no ? ?Notes to clinic:  Unable to refill per protocol, cannot delegate.  ? ? ?  ?Requested Prescriptions  ?Pending Prescriptions Disp Refills  ? tiZANidine (ZANAFLEX) 4 MG tablet 90 tablet 0  ?  Sig: START WITH 1/2 TABLET BY MOUTH EVERY 8 HOURS. MAY CAUSE DROWSINEES. INCREASE TO 1 TABLET EVERY 8 HOURS AS NEEDED FOR MUSCLE IF TOLERABLE.  ?  ? Not Delegated - Cardiovascular:  Alpha-2 Agonists - tizanidine Failed - 06/17/2021 10:31 PM  ?  ?  Failed - This refill cannot be delegated  ?  ?  Passed - Valid encounter within last 6 months  ?  Recent Outpatient Visits   ? ?      ? 3 weeks ago Pre-diabetes  ? [place], Charlane Ferretti, MD  ? 7 months ago Vasomotor symptoms due to menopause  ? [place], Charlane Ferretti, MD  ? 8 months ago UTI symptoms  ? [place], [place] W, NP  ? 12 months ago Other spondylosis with radiculopathy, cervical region  ? [facility] Charlott Rakes, MD  ? 1 year ago Smoking  ? [place], [place], [place]  ? ?  ?  ?Future Appointments   ? ?        ? In 2 weeks Marybelle Killings, MD [facility]  ? ?  ? ?  ?  ?  ? ondansetron (ZOFRAN-ODT) 8 MG disintegrating tablet 20 tablet 0  ?  Sig: Take 1 tablet (8 mg total) by mouth daily as needed for nausea or vomiting.  ?  ? Not Delegated - Gastroenterology: Antiemetics - ondansetron Failed - 06/17/2021 10:31 PM  ?  ?  Failed - This refill cannot be delegated  ?  ?  Passed - AST in normal range and within 360 days  ?  AST  ?Date Value Ref Range Status  ?11/24/2020 18 0 - 40 IU/L Final  ?  ?  ?  ?  Passed - ALT in normal range and within 360 days  ?  ALT  ?Date Value Ref Range  Status  ?11/24/2020 15 0 - 32 IU/L Final  ?  ?  ?  ?  Passed - Valid encounter within last 6 months  ?  Recent Outpatient Visits   ? ?      ? 3 weeks ago Pre-diabetes  ? [place], Charlane Ferretti, MD  ? 7 months ago Vasomotor symptoms due to menopause  ? [place], Charlane Ferretti, MD  ? 8 months ago UTI symptoms  ? [place], [place] W, NP  ? 12 months ago Other spondylosis with radiculopathy, cervical region  ? [facility] Charlott Rakes, MD  ? 1 year ago Smoking  ? [place], [place], [place]  ? ?  ?  ?Future Appointments   ? ?        ? In 2 weeks Marybelle Killings, MD [facility]  ? ?  ? ?  ?  ?  ? ?

## 2021-06-21 ENCOUNTER — Ambulatory Visit
Admission: RE | Admit: 2021-06-21 | Discharge: 2021-06-21 | Disposition: A | Discharge status: Home / Self Care | Payer: No Typology Code available for payment source | Source: Ambulatory Visit | Attending: Surgery | Admitting: Surgery

## 2021-06-21 ENCOUNTER — Other Ambulatory Visit: Payer: Self-pay

## 2021-06-21 ENCOUNTER — Ambulatory Visit
Admission: RE | Admit: 2021-06-21 | Discharge: 2021-06-21 | Disposition: A | Discharge status: Home / Self Care | Payer: Self-pay | Source: Ambulatory Visit | Attending: Surgery | Admitting: Surgery

## 2021-06-21 DIAGNOSIS — M5416 Radiculopathy, lumbar region: Secondary | ICD-10-CM

## 2021-06-21 DIAGNOSIS — M542 Cervicalgia: Secondary | ICD-10-CM

## 2021-06-22 ENCOUNTER — Other Ambulatory Visit: Payer: Self-pay | Admitting: Orthopaedic Surgery

## 2021-06-22 ENCOUNTER — Telehealth: Payer: Self-pay | Admitting: Orthopaedic Surgery

## 2021-06-22 ENCOUNTER — Other Ambulatory Visit: Payer: Self-pay

## 2021-06-22 MED ORDER — TRAMADOL HCL 50 MG PO TABS: 50.0000 mg | ORAL_TABLET | Freq: Three times a day (TID) | ORAL | 2 refills | Status: DC | PRN

## 2021-06-22 NOTE — Telephone Encounter (Signed)
Patient called. She would like some pain medication called in for her. Her call back number is [phone] ?

## 2021-06-22 NOTE — Telephone Encounter (Signed)
Patient asks if you could please send medication to [facility] instead. ?

## 2021-06-22 NOTE — Telephone Encounter (Signed)
Could you please advise? Patient had MRI C-spine and MRI L-spine yesterday that was ordered by Jeneen Rinks. ?

## 2021-06-23 NOTE — Telephone Encounter (Signed)
I called patient and advised. She will pick up at [place]. ?

## 2021-06-26 ENCOUNTER — Other Ambulatory Visit: Payer: Self-pay

## 2021-06-30 ENCOUNTER — Other Ambulatory Visit: Payer: Self-pay

## 2021-07-03 ENCOUNTER — Other Ambulatory Visit: Payer: Self-pay

## 2021-07-03 ENCOUNTER — Encounter: Payer: Self-pay | Admitting: Orthopaedic Surgery

## 2021-07-03 ENCOUNTER — Ambulatory Visit (INDEPENDENT_AMBULATORY_CARE_PROVIDER_SITE_OTHER): Payer: Self-pay | Admitting: Orthopaedic Surgery

## 2021-07-03 VITALS — BP 122/88 | HR 82 | Ht 69.0 in | Wt 155.0 lb

## 2021-07-03 DIAGNOSIS — M545 Low back pain, unspecified: Secondary | ICD-10-CM

## 2021-07-03 DIAGNOSIS — G8929 Other chronic pain: Secondary | ICD-10-CM

## 2021-07-05 NOTE — Progress Notes (Signed)
? ?Office Visit Note ?  ?Evans: Isabel Evans           ?Date of Birth: 03/14/1971           ?MRN: [id number] ?Visit Date: 07/03/2021 ?             ?Requested by: Charlott Rakes, MD ?[address] ?[address] ?[place],  [place] [address] ?PCP: Charlott Rakes, MD ? ? ?Assessment & Plan: ?Visit Diagnoses:  ?1. Chronic bilateral low back pain, unspecified whether sciatica present   ? ? ?Plan: We reviewed cervical MRI scan with her.  Good decompression at the level of fusion where she had previous disc protrusion C5-6.  She does have broad-based foraminal disc at L5-S1 and broad-based disc at L4-5.  We will set up for an epidural with Dr. Ernestina Patches hopefully she will get some good relief. ? ?Follow-Up Instructions: No follow-ups on file.  ? ?Orders:  ?Orders Placed This Encounter  ?Procedures  ? Ambulatory referral to Physical Medicine Rehab  ? ?No orders of the defined types were placed in this encounter. ? ? ? ? Procedures: ?No procedures performed ? ? ?Clinical Data: ?No additional findings. ? ? ?Subjective: ?Chief Complaint  ?Evans presents with  ? Neck - Pain, Follow-up  ? Lower Back - Pain, Follow-up  ? ? ?HPI 51 year old female returns with ongoing problems with more back and neck pain symptoms.  States she has had some problems with headaches had previous C5-6 interbody fusion with good healing.  New MRI scan showed mild C3-4 disc bulge with mild foraminal narrowing on the left none on the right.   ? ?Review of Systems updated unchanged. ? ? ?Objective: ?Vital Signs: BP 122/88   Pulse 82   Ht '5\' 9"'$  (1.753 m)   Wt 155 lb (70.3 kg)   LMP 07/04/2017   BMI 22.89 kg/m?  ? ?Physical Exam ?Constitutional:   ?   Appearance: She is well-developed.  ?HENT:  ?   Head: Normocephalic.  ?   Right Ear: External ear normal.  ?   Left Ear: External ear normal. There is no impacted cerumen.  ?Eyes:  ?   Pupils: Pupils are equal, round, and reactive to light.  ?Neck:  ?   Thyroid: No thyromegaly.  ?   Trachea: No  tracheal deviation.  ?Cardiovascular:  ?   Rate and Rhythm: Normal rate.  ?Pulmonary:  ?   Effort: Pulmonary effort is normal.  ?Abdominal:  ?   Palpations: Abdomen is soft.  ?Musculoskeletal:  ?   Cervical back: No rigidity.  ?Skin: ?   General: Skin is warm and dry.  ?Neurological:  ?   Mental Status: She is alert and oriented to person, place, and time.  ?Psychiatric:     ?   Behavior: Behavior normal.  ?Multiple checks Ortho Exam Evans is able to heel and toe walk.  Bilateral Dupuytren's involving the small finger.  Left hand is been released with early rapid recurrence.  She has had to hand surgeon opinions that recommend against surgery. ? ?Specialty Comments:  ?No specialty comments available. ? ?Imaging: ?Narrative & Impression  ?CLINICAL DATA:  Low back pain.  Worsening over the last 6 weeks. ?  ?EXAM: ?MRI LUMBAR SPINE WITHOUT CONTRAST ?  ?TECHNIQUE: ?Multiplanar, multisequence MR imaging of the lumbar spine was ?performed. No intravenous contrast was administered. ?  ?COMPARISON:  03/15/2018 ?  ?FINDINGS: ?Segmentation:  Standard. ?  ?Alignment:  Physiologic. ?  ?Vertebrae: No acute fracture, evidence of discitis,  or aggressive ?bone lesion. ?  ?Conus medullaris and cauda equina: Conus extends to the T12 level. ?Conus and cauda equina appear normal. ?  ?Paraspinal and other soft tissues: No acute paraspinal abnormality. ?  ?Disc levels: ?  ?Disc spaces: Disc spaces are maintained. ?  ?T12-L1: No significant disc bulge. No neural foraminal stenosis. No ?central canal stenosis. ?  ?L1-L2: No significant disc bulge. No neural foraminal stenosis. No ?central canal stenosis. ?  ?L2-L3: Mild broad-based disc bulge with a broad right paracentral ?disc protrusion. Right subarticular recess and foraminal narrowing. ?No left foraminal stenosis. No spinal stenosis. ?  ?L3-L4: No significant disc bulge. No neural foraminal stenosis. No ?central canal stenosis. ?  ?L4-L5: Broad-based disc bulge with a broad shallow  left foraminal ?disc protrusion. Mild bilateral foraminal narrowing. No spinal ?stenosis. ?  ?L5-S1: Broad shallow left foraminal disc protrusion contacting the ?left L5 nerve root. Moderate left foraminal stenosis. No right ?foraminal stenosis. No spinal stenosis. ?  ?IMPRESSION: ?1. At L5-S1 there is a broad shallow left foraminal disc protrusion ?contacting the left L5 nerve root. Moderate left foraminal stenosis. ?2. At L4-5 there is a broad-based disc bulge with a broad shallow ?left foraminal disc protrusion. Mild bilateral foraminal narrowing. ?3. At L2-3 there is a mild broad-based disc bulge with a broad right ?paracentral disc protrusion. Right subarticular recess and foraminal ?narrowing. ?4.  No acute osseous injury of the lumbar spine. ?  ?  ?Electronically Signed ?  By: Kathreen Devoid M.D. ?  On: 06/21/2021 13:52  ? ?Narrative & Impression  ?CLINICAL DATA:  Neck pain, prior ACDF at C5-6. ?  ?EXAM: ?MRI CERVICAL SPINE WITHOUT CONTRAST ?  ?TECHNIQUE: ?Multiplanar, multisequence MR imaging of the cervical spine was ?performed. No intravenous contrast was administered. ?  ?COMPARISON:  06/04/2020 ?  ?FINDINGS: ?Alignment: Physiologic. ?  ?Vertebrae: No acute fracture, evidence of discitis, or bone lesion. ?  ?Cord: Normal signal and morphology. ?  ?Posterior Fossa, vertebral arteries, paraspinal tissues: Posterior ?fossa demonstrates no focal abnormality. Vertebral artery flow voids ?are maintained. Paraspinal soft tissues are unremarkable. ?  ?Disc levels: ?  ?Discs: Anterior cervical disc fusion at C5-6. Disc spaces are ?otherwise maintained. ?  ?C2-3: No significant disc bulge. No neural foraminal stenosis. No ?central canal stenosis. ?  ?C3-4: Mild broad-based disc bulge. Mild left foraminal stenosis. No ?right foraminal stenosis. No spinal stenosis. ?  ?C4-5: No significant disc bulge. No neural foraminal stenosis. No ?central canal stenosis. ?  ?C5-6: Interbody fusion.  No foraminal or central canal  stenosis. ?  ?C6-7: No significant disc bulge. No neural foraminal stenosis. No ?central canal stenosis. ?  ?C7-T1: No significant disc bulge. No neural foraminal stenosis. No ?central canal stenosis. ?  ?IMPRESSION: ?1. Interval anterior cervical disc fusion at C5-6 without foraminal ?or central canal stenosis. ?2. At C3-4 there is a mild broad-based disc bulge with mild left ?foraminal stenosis. ?  ?  ?Electronically Signed ?  By: Kathreen Devoid M.D. ?  On: 06/21/2021 13:54  ? ? ? ?PMFS History: ?Evans Active Problem List  ? Diagnosis Date Noted  ? Family history of cancer 04/08/2021  ? Menopause 04/08/2021  ? S/P cervical spinal fusion 09/16/2020  ? Cervical spinal stenosis 07/30/2020  ? Other spondylosis with radiculopathy, cervical region 06/18/2020  ? Major depressive disorder, recurrent episode, moderate ([place]) 05/26/2020  ? Protrusion of cervical intervertebral disc 03/22/2018  ? Anemia 11/24/2017  ? Pre-diabetes 11/24/2017  ? Blurry vision, bilateral 05/18/2017  ? Callus  of foot 05/18/2017  ? Pain in joint of left hip 02/16/2017  ? Bruises easily 11/10/2016  ? Gastroesophageal reflux disease without esophagitis 09/22/2016  ? Fatigue associated with anemia 09/22/2016  ? Flexion contractures 09/22/2016  ? Generalized anxiety disorder 01/01/2016  ? Dental abscess 01/01/2016  ? Healthcare maintenance 05/08/2015  ? Adjustment disorder with mixed anxiety and depressed mood 05/08/2015  ? Stress at home 10/24/2014  ? Irritable bowel syndrome 10/24/2014  ? Anxiety state 10/24/2014  ? Preop testing 09/19/2014  ? Pap smear for cervical cancer screening 09/19/2014  ? Midline low back pain without sciatica 08/12/2014  ? [place] nodes 08/12/2014  ? Contracture of joint, hand 08/12/2014  ? Essential hypertension 02/11/2014  ? Allergy 02/11/2014  ? Screening for breast cancer 02/11/2014  ? Dupuytren's contracture of both hands 11/15/2013  ? Hallux valgus of right foot 11/15/2013  ? Back pain 05/21/2013  ? UTI (urinary  tract infection) 05/21/2013  ? Multiple skin nodules 11/27/2012  ? Fibromyalgia 09/08/2012  ? Osteoarthritis 09/08/2012  ? Neck muscle spasm 07/14/2012  ? Plantar wart 07/14/2012  ? Insomnia 07/14/2012  ? ?[facility]

## 2021-07-10 DIAGNOSIS — M79676 Pain in unspecified toe(s): Secondary | ICD-10-CM

## 2021-07-16 ENCOUNTER — Ambulatory Visit (INDEPENDENT_AMBULATORY_CARE_PROVIDER_SITE_OTHER): Payer: Self-pay | Admitting: Physical Medicine and Rehabilitation

## 2021-07-16 ENCOUNTER — Ambulatory Visit: Payer: Self-pay

## 2021-07-16 ENCOUNTER — Encounter: Payer: Self-pay | Admitting: Physical Medicine and Rehabilitation

## 2021-07-16 VITALS — BP 119/74 | HR 60

## 2021-07-16 DIAGNOSIS — M5416 Radiculopathy, lumbar region: Secondary | ICD-10-CM

## 2021-07-16 MED ORDER — METHYLPREDNISOLONE ACETATE 80 MG/ML IJ SUSP
80.0000 mg | Freq: Once | INTRAMUSCULAR | Status: AC
  Administered 2021-07-16: 80 mg

## 2021-07-16 NOTE — Procedures (Signed)
Lumbar Epidural Steroid Injection - Interlaminar Approach with Fluoroscopic Guidance ? ?Patient: Isabel Evans      ?Date of Birth: January 01, 1971 ?MRN: [id number] ?PCP: Charlott Rakes, MD      ?Visit Date: 07/16/2021 ?  ?Universal Protocol:    ? ?Consent Given By: the patient ? ?Position: PRONE ? ?Additional Comments: ?Vital signs were monitored before and after the procedure. ?Patient was prepped and draped in the usual sterile fashion. ?The correct patient, procedure, and site was verified. ? ? ?Injection Procedure Details:  ? ?Procedure diagnoses: Lumbar radiculopathy [M54.16]  ? ?Meds Administered:  ?Meds ordered this encounter  ?Medications  ? methylPREDNISolone acetate (DEPO-MEDROL) injection 80 mg  ?  ? ?Laterality: Left ? ?Location/Site:  L5-S1 ? ?Needle: 3.5 in., 20 ga. Tuohy ? ?Needle Placement: Paramedian epidural ? ?Findings:  ? -Comments: Excellent flow of contrast into the epidural space. ? ?Procedure Details: ?Using a paramedian approach from the side mentioned above, the region overlying the inferior lamina was localized under fluoroscopic visualization and the soft tissues overlying this structure were infiltrated with 4 ml. of 1% Lidocaine without Epinephrine. The Tuohy needle was inserted into the epidural space using a paramedian approach.  ? ?The epidural space was localized using loss of resistance along with counter oblique bi-planar fluoroscopic views.  After negative aspirate for air, blood, and CSF, a 2 ml. volume of Isovue-250 was injected into the epidural space and the flow of contrast was observed. Radiographs were obtained for documentation purposes.   ? ?The injectate was administered into the level noted above. ? ? ?Additional Comments:  ?The patient tolerated the procedure well ?Dressing: 2 x 2 sterile gauze and Band-Aid ?  ? ?Post-procedure details: ?Patient was observed during the procedure. ?Post-procedure instructions were reviewed. ? ?Patient left the clinic in stable  condition. ?

## 2021-07-16 NOTE — Progress Notes (Signed)
Pt state lower back pain that travels down her left leg. Pt state walking and getting up from a sitting position makes the pain worse. Pt state she takes pain meds and uses heat to help ease her pain. ? ?Numeric Pain Rating Scale and Functional Assessment ?Average Pain 5 ? ? ?In the last MONTH (on 0-10 scale) has pain interfered with the following? ? ?1. General activity like being  able to carry out your everyday physical activities such as walking, climbing stairs, carrying groceries, or moving a chair?  ?Rating(10) ? ? ?+Driver, -BT, -Dye Allergies. ? ?

## 2021-07-16 NOTE — Progress Notes (Signed)
? ?Isabel Evans - 51 y.o. female MRN [id number]  Date of birth: 11/14/1970 ? ?Office Visit Note: ?Visit Date: 07/16/2021 ?PCP: Charlott Rakes, MD ?Referred by: Charlott Rakes, MD ? ?Subjective: ?Chief Complaint  ?Patient presents with  ? Lower Back - Pain  ? Left Leg - Pain  ? ?HPI:  Isabel Evans is a 51 y.o. female who comes in today for planned repeat Left L5-S1  Lumbar Interlaminar epidural steroid injection with fluoroscopic guidance.  The patient has failed conservative care including home exercise, medications, time and activity modification.  This injection will be diagnostic and hopefully therapeutic.  Please see requesting physician notes for further details and justification. Patient received more than 50% pain relief from prior injection.  ? ?Referring: Dr. Rodell Perna ? ?ROS Otherwise per HPI. ? ?Assessment & Plan: ?Visit Diagnoses:  ?  ICD-10-CM   ?1. Lumbar radiculopathy  M54.16 XR C-ARM NO REPORT  ?  Epidural Steroid injection  ?  methylPREDNISolone acetate (DEPO-MEDROL) injection 80 mg  ?  ?  ?Plan: No additional findings.  ? ?Meds & Orders:  ?Meds ordered this encounter  ?Medications  ? methylPREDNISolone acetate (DEPO-MEDROL) injection 80 mg  ?  ?Orders Placed This Encounter  ?Procedures  ? XR C-ARM NO REPORT  ? Epidural Steroid injection  ?  ?Follow-up: Return if symptoms worsen or fail to improve.  ? ?Procedures: ?No procedures performed  ?Lumbar Epidural Steroid Injection - Interlaminar Approach with Fluoroscopic Guidance ? ?Patient: Isabel Evans      ?Date of Birth: 1970/05/08 ?MRN: [id number] ?PCP: Charlott Rakes, MD      ?Visit Date: 07/16/2021 ?  ?Universal Protocol:    ? ?Consent Given By: the patient ? ?Position: PRONE ? ?Additional Comments: ?Vital signs were monitored before and after the procedure. ?Patient was prepped and draped in the usual sterile fashion. ?The correct patient, procedure, and site was verified. ? ? ?Injection Procedure Details:  ? ?Procedure  diagnoses: Lumbar radiculopathy [M54.16]  ? ?Meds Administered:  ?Meds ordered this encounter  ?Medications  ? methylPREDNISolone acetate (DEPO-MEDROL) injection 80 mg  ?  ? ?Laterality: Left ? ?Location/Site:  L5-S1 ? ?Needle: 3.5 in., 20 ga. Tuohy ? ?Needle Placement: Paramedian epidural ? ?Findings:  ? -Comments: Excellent flow of contrast into the epidural space. ? ?Procedure Details: ?Using a paramedian approach from the side mentioned above, the region overlying the inferior lamina was localized under fluoroscopic visualization and the soft tissues overlying this structure were infiltrated with 4 ml. of 1% Lidocaine without Epinephrine. The Tuohy needle was inserted into the epidural space using a paramedian approach.  ? ?The epidural space was localized using loss of resistance along with counter oblique bi-planar fluoroscopic views.  After negative aspirate for air, blood, and CSF, a 2 ml. volume of Isovue-250 was injected into the epidural space and the flow of contrast was observed. Radiographs were obtained for documentation purposes.   ? ?The injectate was administered into the level noted above. ? ? ?Additional Comments:  ?The patient tolerated the procedure well ?Dressing: 2 x 2 sterile gauze and Band-Aid ?  ? ?Post-procedure details: ?Patient was observed during the procedure. ?Post-procedure instructions were reviewed. ? ?Patient left the clinic in stable condition.  ? ?Clinical History: ?MRI LUMBAR SPINE WITHOUT CONTRAST ?  ?TECHNIQUE: ?Multiplanar, multisequence MR imaging of the lumbar spine was ?performed. No intravenous contrast was administered. ?  ?COMPARISON:  03/15/2018 ?  ?FINDINGS: ?Segmentation:  Standard. ?  ?Alignment:  Physiologic. ?  ?Vertebrae: No acute fracture,  evidence of discitis, or aggressive ?bone lesion. ?  ?Conus medullaris and cauda equina: Conus extends to the T12 level. ?Conus and cauda equina appear normal. ?  ?Paraspinal and other soft tissues: No acute paraspinal  abnormality. ?  ?Disc levels: ?  ?Disc spaces: Disc spaces are maintained. ?  ?T12-L1: No significant disc bulge. No neural foraminal stenosis. No ?central canal stenosis. ?  ?L1-L2: No significant disc bulge. No neural foraminal stenosis. No ?central canal stenosis. ?  ?L2-L3: Mild broad-based disc bulge with a broad right paracentral ?disc protrusion. Right subarticular recess and foraminal narrowing. ?No left foraminal stenosis. No spinal stenosis. ?  ?L3-L4: No significant disc bulge. No neural foraminal stenosis. No ?central canal stenosis. ?  ?L4-L5: Broad-based disc bulge with a broad shallow left foraminal ?disc protrusion. Mild bilateral foraminal narrowing. No spinal ?stenosis. ?  ?L5-S1: Broad shallow left foraminal disc protrusion contacting the ?left L5 nerve root. Moderate left foraminal stenosis. No right ?foraminal stenosis. No spinal stenosis. ?  ?IMPRESSION: ?1. At L5-S1 there is a broad shallow left foraminal disc protrusion ?contacting the left L5 nerve root. Moderate left foraminal stenosis. ?2. At L4-5 there is a broad-based disc bulge with a broad shallow ?left foraminal disc protrusion. Mild bilateral foraminal narrowing. ?3. At L2-3 there is a mild broad-based disc bulge with a broad right ?paracentral disc protrusion. Right subarticular recess and foraminal ?narrowing. ?4.  No acute osseous injury of the lumbar spine. ?  ?  ?Electronically Signed ?  By: Kathreen Devoid M.D. ?  On: 06/21/2021 13:52  ? ? ? ?Objective:  VS:  HT:    WT:   BMI:     BP:119/74  HR:60bpm  TEMP: ( )  RESP:  ?Physical Exam ?Vitals and nursing note reviewed.  ?Constitutional:   ?   General: She is not in acute distress. ?   Appearance: Normal appearance. She is not ill-appearing.  ?HENT:  ?   Head: Normocephalic and atraumatic.  ?   Right Ear: External ear normal.  ?   Left Ear: External ear normal.  ?Eyes:  ?   Extraocular Movements: Extraocular movements intact.  ?Cardiovascular:  ?   Rate and Rhythm: Normal  rate.  ?   Pulses: Normal pulses.  ?Pulmonary:  ?   Effort: Pulmonary effort is normal. No respiratory distress.  ?Abdominal:  ?   General: There is no distension.  ?   Palpations: Abdomen is soft.  ?Musculoskeletal:     ?   General: Tenderness present.  ?   Cervical back: Neck supple.  ?   Right lower leg: No edema.  ?   Left lower leg: No edema.  ?   Comments: Patient has good distal strength with no pain over the greater trochanters.  No clonus or focal weakness.  ?Skin: ?   Findings: No erythema, lesion or rash.  ?Neurological:  ?   General: No focal deficit present.  ?   Mental Status: She is alert and oriented to person, place, and time.  ?   Sensory: No sensory deficit.  ?   Motor: No weakness or abnormal muscle tone.  ?   Coordination: Coordination normal.  ?Psychiatric:     ?   Mood and Affect: Mood normal.     ?   Behavior: Behavior normal.  ?  ? ?Imaging: ?XR C-ARM NO REPORT ? ?Result Date: 07/16/2021 ?Please see Notes tab for imaging impression.  ?

## 2021-07-16 NOTE — Patient Instructions (Signed)

## 2021-07-21 ENCOUNTER — Telehealth (INDEPENDENT_AMBULATORY_CARE_PROVIDER_SITE_OTHER): Payer: No Payment, Other | Admitting: Psychiatry

## 2021-07-21 ENCOUNTER — Other Ambulatory Visit: Payer: Self-pay

## 2021-07-21 ENCOUNTER — Encounter (HOSPITAL_COMMUNITY): Payer: Self-pay | Admitting: Psychiatry

## 2021-07-21 DIAGNOSIS — F411 Generalized anxiety disorder: Secondary | ICD-10-CM | POA: Diagnosis not present

## 2021-07-21 DIAGNOSIS — F331 Major depressive disorder, recurrent, moderate: Secondary | ICD-10-CM

## 2021-07-21 MED ORDER — GABAPENTIN 300 MG PO CAPS
300.0000 mg | ORAL_CAPSULE | Freq: Three times a day (TID) | ORAL | 2 refills | Status: DC
  Filled 2021-07-21 – 2021-07-27 (×2): qty 90, 30d supply, fill #0
  Filled 2021-09-04: qty 90, 30d supply, fill #1
  Filled 2021-10-06: qty 90, 30d supply, fill #2

## 2021-07-21 MED ORDER — ESCITALOPRAM OXALATE 20 MG PO TABS
20.0000 mg | ORAL_TABLET | Freq: Every day | ORAL | 2 refills | Status: DC
  Filled 2021-07-21 – 2021-07-28 (×2): qty 30, 30d supply, fill #0
  Filled 2021-09-04: qty 30, 30d supply, fill #1
  Filled 2021-10-06: qty 30, 30d supply, fill #2

## 2021-07-21 MED ORDER — QUETIAPINE FUMARATE 50 MG PO TABS
25.0000 mg | ORAL_TABLET | Freq: Every day | ORAL | 2 refills | Status: DC
  Filled 2021-07-21 – 2021-07-27 (×2): qty 30, 60d supply, fill #0
  Filled 2021-09-04: qty 15, 30d supply, fill #1
  Filled 2021-10-06: qty 15, 30d supply, fill #2

## 2021-07-21 NOTE — Progress Notes (Signed)
Home OP NP Progress Note ? ?Patient Identification: Isabel Evans ?MRN:  [id number] ?Date of Evaluation:  07/21/2021 ?Chief Complaint:  Depression and anxiety ?Chief Complaint   ?Anxiety; Depression; Follow-up ?  ? ? ?Visit Diagnosis:  Major depressive disorder, moderate; General anxiety disorder ? ?History of Present Illness:   ?51 yo female with depression and anxiety.  "My mood is a little better" 4/10 depression with 10 being the highest, no suicidal ideations. 4-5/10 anxiety with less panic attacks.  Sleep is "good", appetite is steady.  Her mother is doing better and she quit her stressful job.  Now, she has one that is not stressful.  Denies side effects from her medications, follow-up in 3 months. ? ?Past Psychiatric History: depression and anxiety ? ? ?Past Medical History:  ?Past Medical History:  ?Diagnosis Date  ? Anemia 11/24/2017  ? Anxiety   ? Bone spur   ? cervical spine  ? Chronic kidney disease 04/2017  ? kidney infections  ? Depression   ? Deviated nasal septum   ? states is unable to lie flat, because her nose will become congested and she can stop breathing  ? Diabetes mellitus without complication ([place])   ? Dysrhythmia   ? heart flutters occasionally  ? Fibromyalgia   ? Fibromyalgia   ? GERD (gastroesophageal reflux disease)   ? Hallux abductovalgus with bunions 09/2014  ? right   ? Hammertoe 09/2014  ? right 4th, 5th  ? History of stomach ulcers   ? Hyperlipidemia   ? Hypertension   ? states is under control with med., has been on med. x 8 mos.  ? IBS (irritable bowel syndrome)   ? no current med.  ? Osteoarthritis   ? hands, bilateral hips  ? Peripheral vascular disease ([place])   ? occasional swelling in both legs  ? Sinus headache   ?  ?Past Surgical History:  ?Procedure Laterality Date  ? ABDOMINAL HYSTERECTOMY    ? ANTERIOR CERVICAL DECOMP/DISCECTOMY FUSION N/A 07/30/2020  ? Procedure: C5-6 ANTERIOR CERVICAL DECOMPRESSION/DISCECTOMY FUSION, ALLOGRAFT, PLATE;  Surgeon: Marybelle Killings, MD;   Location: [facility];  Service: Orthopedics;  Laterality: N/A;  ? BREAST BIOPSY Left 04/2018  ? benign  ? BREAST BIOPSY Left 2016  ? benign  ? BUNIONECTOMY Right 10/04/2014  ? Procedure:  Altamese Paxton, Emmaline Life;  Surgeon: Trula Slade, DPM;  Location: [facility];  Service: Podiatry;  Laterality: Right;  ? COLONOSCOPY WITH PROPOFOL  02/13/2013  ? CYSTOSCOPY N/A 07/07/2017  ? Procedure: CYSTOSCOPY;  Surgeon: Aletha Halim, MD;  Location: [facility];  Service: Gynecology;  Laterality: N/A;  ? DUPUYTREN / PALMAR FASCIOTOMY Right 07/18/2013  ? exc. of multiple nodules  ? 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 small finger  ? HAMMER TOE SURGERY Right 10/04/2014  ? Procedure: HAMMER TOE REPAIR 4TH  AND 5TH  RIGHT FOOT  fourth toe fixation with k wire;  Surgeon: Trula Slade, DPM;  Location: [facility];  Service: Podiatry;  Laterality: Right;  ? LEG SURGERY Left   ? as a child; near amputation of leg  ? TUBAL LIGATION    ? VAGINAL HYSTERECTOMY N/A 07/07/2017  ? Procedure: HYSTERECTOMY VAGINAL;  Surgeon: Aletha Halim, MD;  Location: [facility];  Service: Gynecology;  Laterality: N/A;  ? ? ?Family Psychiatric History: see below, mother and sister with depression and anxiety ? ?Family History:  ?Family History  ?Problem Relation Age of Onset  ? Hypertension Mother   ?  Diabetes Mother   ? Heart disease Mother   ? Hypertension Father   ? Diabetes Father   ? Prostate cancer Father   ? Heart disease Father   ? Alcohol abuse Father   ? Hypertension Sister   ? Diabetes Sister   ? Heart disease Maternal Grandmother   ?     Great GM  ? Breast cancer Maternal Grandmother   ? Bone cancer Maternal Grandmother   ? Breast cancer Maternal Aunt   ? Ovarian cancer Maternal Aunt   ? Rheum arthritis Sister   ? Colon cancer Other   ?     great Aunt  ? Rectal cancer Neg Hx   ? Stomach cancer Neg Hx   ? ? ?Social History:   ?Social History  ? ?Socioeconomic History  ? Marital status: Single   ?  Spouse name: Not on file  ? Number of children: 3  ? Years of education: Not on file  ? Highest education level: 7th grade  ?Occupational History  ? Occupation: cook  ?Tobacco Use  ? Smoking status: Former  ?  Years: 20.00  ?  Types: Cigarettes, E-cigarettes  ?  Start date: 01/19/2021  ?  Quit date: 08/24/2012  ?  Years since quitting: 8.9  ? Smokeless tobacco: Never  ?Vaping Use  ? Vaping Use: Every day  ? Substances: Flavoring  ?Substance and Sexual Activity  ? Alcohol use: Yes  ?  Comment: occasionally  ? Drug use: Yes  ?  Types: Marijuana  ?  Comment: occ.  ? Sexual activity: Yes  ?  Partners: Male  ?  Birth control/protection: Surgical  ?  Comment: 1 partner  ?Other Topics Concern  ? Not on file  ?Social History Narrative  ? Not on file  ? ?Social Determinants of Health  ? ?Financial Resource Strain: Low Risk   ? Difficulty of Paying Living Expenses: Not very hard  ?Food Insecurity: No Food Insecurity  ? Worried About Charity fundraiser in the Last Year: Never true  ? Ran Out of Food in the Last Year: Never true  ?Transportation Needs: No Transportation Needs  ? Lack of Transportation (Medical): No  ? Lack of Transportation (Non-Medical): No  ?Physical Activity: Insufficiently Active  ? Days of Exercise per Week: 1 day  ? Minutes of Exercise per Session: 30 min  ?Stress: No Stress Concern Present  ? Feeling of Stress : Only a little  ?Social Connections: Moderately Isolated  ? Frequency of Communication with Friends and Family: Twice a week  ? Frequency of Social Gatherings with Friends and Family: Once a week  ? Attends Religious Services: 1 to 4 times per year  ? Active Member of Clubs or Organizations: No  ? Attends Archivist Meetings: Never  ? Marital Status: Never married  ? ? ?Additional Social History: lives with supportive partner, works part-time ? ?Allergies:  No Known Allergies ? ?Metabolic Disorder Labs: ?Lab Results  ?Component Value Date  ? HGBA1C 5.4 05/25/2021  ? ?No results found  for: PROLACTIN ?Lab Results  ?Component Value Date  ? CHOL 153 11/24/2020  ? TRIG 138 11/24/2020  ? HDL 73 11/24/2020  ? CHOLHDL 2.1 11/24/2020  ? [place] 57 11/24/2020  ? [place] 62 08/24/2019  ? ?Lab Results  ?Component Value Date  ? TSH 0.606 01/04/2020  ? ? ?Therapeutic Level Labs: ?No results found for: LITHIUM ?No results found for: CBMZ ?No results found for: VALPROATE ? ?Current Medications: ?Current Outpatient Medications  ?  Medication Sig Dispense Refill  ? diclofenac (VOLTAREN) 50 MG EC tablet TAKE 1 TABLET (50 MG TOTAL) BY MOUTH 2 (TWO) TIMES DAILY. AFTER A MEAL AS NEEDED FOR PAIN 60 tablet 2  ? dicyclomine (BENTYL) 10 MG capsule TAKE 1 CAPSULE BY MOUTH 2 TIMES DAILY AS NEEDED 60 capsule 2  ? diphenhydrAMINE (BENADRYL) 25 MG tablet Take 1 tablet (25 mg total) by mouth daily. 30 tablet 0  ? escitalopram (LEXAPRO) 20 MG tablet Take 1 tablet (20 mg total) by mouth daily. 30 tablet 2  ? estradiol (ESTRACE) 0.5 MG tablet Take 1 tablet (0.5 mg total) by mouth daily. 90 tablet 3  ? gabapentin (NEURONTIN) 300 MG capsule Take 1 capsule (300 mg total) by mouth 3 (three) times daily. 90 capsule 2  ? lidocaine (LIDODERM) 5 % Place 1 patch onto the skin daily. Remove & Discard patch within 12 hours or as directed by MD 30 patch 6  ? metFORMIN (GLUCOPHAGE) 500 MG tablet TAKE 1 TABLET (500 MG TOTAL) BY MOUTH DAILY WITH BREAKFAST. 30 tablet 3  ? omeprazole (PRILOSEC) 20 MG capsule TAKE 2 CAPSULES (40 MG TOTAL) BY MOUTH DAILY. 60 capsule 2  ? ondansetron (ZOFRAN-ODT) 8 MG disintegrating tablet Dissolve 1 tablet (8 mg total) by mouth daily as needed for nausea or vomiting. 20 tablet 0  ? QUEtiapine (SEROQUEL) 50 MG tablet Take 0.5 tablets (25 mg total) by mouth at bedtime. 30 tablet 2  ? rosuvastatin (CRESTOR) 20 MG tablet TAKE 1 TABLET (20 MG TOTAL) BY MOUTH DAILY. TO LOWER CHOLESTEROL 30 tablet 2  ? tiZANidine (ZANAFLEX) 4 MG tablet START WITH 1/2 TABLET BY MOUTH EVERY 8 HOURS. MAY CAUSE DROWSINEES. INCREASE TO 1 TABLET  EVERY 8 HOURS AS NEEDED FOR MUSCLE PAIN IF TOLERABLE. 90 tablet 0  ? traMADol (ULTRAM) 50 MG tablet Take 1 tablet (50 mg total) by mouth 3 (three) times daily as needed. 30 tablet 2  ? ?No current facility-admi

## 2021-07-27 ENCOUNTER — Other Ambulatory Visit: Payer: Self-pay

## 2021-07-27 ENCOUNTER — Other Ambulatory Visit: Payer: Self-pay | Admitting: Family Medicine

## 2021-07-27 DIAGNOSIS — E782 Mixed hyperlipidemia: Secondary | ICD-10-CM

## 2021-07-27 DIAGNOSIS — M545 Low back pain, unspecified: Secondary | ICD-10-CM

## 2021-07-27 DIAGNOSIS — M542 Cervicalgia: Secondary | ICD-10-CM

## 2021-07-27 DIAGNOSIS — M2550 Pain in unspecified joint: Secondary | ICD-10-CM

## 2021-07-27 DIAGNOSIS — K582 Mixed irritable bowel syndrome: Secondary | ICD-10-CM

## 2021-07-27 DIAGNOSIS — R110 Nausea: Secondary | ICD-10-CM

## 2021-07-28 ENCOUNTER — Other Ambulatory Visit: Payer: Self-pay

## 2021-07-28 MED ORDER — ROSUVASTATIN CALCIUM 20 MG PO TABS
ORAL_TABLET | ORAL | 2 refills | Status: DC
  Filled 2021-07-28: qty 30, 30d supply, fill #0
  Filled 2021-09-04: qty 30, 30d supply, fill #1
  Filled 2021-10-06 – 2021-10-18 (×2): qty 30, 30d supply, fill #2

## 2021-07-28 MED ORDER — DICYCLOMINE HCL 10 MG PO CAPS
ORAL_CAPSULE | ORAL | 2 refills | Status: DC
  Filled 2021-07-28: qty 60, 30d supply, fill #0
  Filled 2021-09-04: qty 60, 30d supply, fill #1
  Filled 2021-10-06 – 2021-10-18 (×2): qty 60, 30d supply, fill #2

## 2021-07-28 NOTE — Telephone Encounter (Signed)
Requested medications are due for refill today.  yes ? ?Requested medications are on the active medications list.  yes ? ?Last refill. Diclofenac 04/28/2021 #60 2 refills, Tizanidine 06/18/2021 #90 0 refills, Zofran 06/18/2021 #20 0 refills ? ?Future visit scheduled.   no ? ?Notes to clinic.  Diclofenac failed protocol d/t expired labs.  Tizanidine and Zofran refills are not delegated. ? ? ? ?Requested Prescriptions  ?Pending Prescriptions Disp Refills  ? diclofenac (VOLTAREN) 50 MG EC tablet 60 tablet 2  ?  Sig: TAKE 1 TABLET (50 MG TOTAL) BY MOUTH 2 (TWO) TIMES DAILY. AFTER A MEAL AS NEEDED FOR PAIN  ?  ? Analgesics:  NSAIDS Failed - 07/27/2021 11:58 AM  ?  ?  Failed - Manual Review: Labs are only required if the patient has taken medication for more than 8 weeks.  ?  ?  Failed - HGB in normal range and within 360 days  ?  Hemoglobin  ?Date Value Ref Range Status  ?07/30/2020 14.2 12.0 - 15.0 g/dL Final  ?05/28/2020 11.8 11.1 - 15.9 g/dL Final  ?  ?  ?  ?  Failed - PLT in normal range and within 360 days  ?  Platelets  ?Date Value Ref Range Status  ?07/30/2020 234 150 - 400 K/uL Final  ?05/28/2020 257 150 - 450 x10E3/uL Final  ?  ?  ?  ?  Failed - HCT in normal range and within 360 days  ?  HCT  ?Date Value Ref Range Status  ?07/30/2020 42.4 36.0 - 46.0 % Final  ? ?Hematocrit  ?Date Value Ref Range Status  ?05/28/2020 35.3 34.0 - 46.6 % Final  ?  ?  ?  ?  Passed - Cr in normal range and within 360 days  ?  Creat  ?Date Value Ref Range Status  ?11/27/2015 0.75 0.50 - 1.10 mg/dL Final  ? ?Creatinine, Ser  ?Date Value Ref Range Status  ?05/25/2021 0.73 0.57 - 1.00 mg/dL Final  ?  ?  ?  ?  Passed - eGFR is 30 or above and within 360 days  ?  GFR calc Af Amer  ?Date Value Ref Range Status  ?05/28/2020 69 >59 mL/min/1.73 Final  ?  Comment:  ?  **In accordance with recommendations from the NKF-ASN Task force,** ?  [facility] is in the process of updating its eGFR calculation to the ?  2021 CKD-EPI creatinine equation that  estimates kidney function ?  without a race variable. ?  ? ?GFR, Estimated  ?Date Value Ref Range Status  ?07/30/2020 >60 >60 mL/min Final  ?  Comment:  ?  (NOTE) ?Calculated using the CKD-EPI Creatinine Equation (2021) ?  ? ?GFR  ?Date Value Ref Range Status  ?03/24/2020 89.57 >60.00 mL/min Final  ?  Comment:  ?  Calculated using the CKD-EPI Creatinine Equation (2021)  ? ?eGFR  ?Date Value Ref Range Status  ?05/25/2021 101 >59 mL/min/1.73 Final  ?  ?  ?  ?  Passed - Patient is not pregnant  ?  ?  Passed - Valid encounter within last 12 months  ?  Recent Outpatient Visits   ? ?      ? 2 months ago Pre-diabetes  ? [place], Charlane Ferretti, MD  ? 8 months ago Vasomotor symptoms due to menopause  ? [place], Charlane Ferretti, MD  ? 9 months ago UTI symptoms  ? [place], [place] W,  NP  ? 1 year ago Other spondylosis with radiculopathy, cervical region  ? [facility] Charlott Rakes, MD  ? 1 year ago Smoking  ? [place], [place], [place]  ? ?  ?  ? ?  ?  ?  ? tiZANidine (ZANAFLEX) 4 MG tablet 90 tablet 0  ?  Sig: START WITH 1/2 TABLET BY MOUTH EVERY 8 HOURS. MAY CAUSE DROWSINEES. INCREASE TO 1 TABLET EVERY 8 HOURS AS NEEDED FOR MUSCLE PAIN IF TOLERABLE.  ?  ? Not Delegated - Cardiovascular:  Alpha-2 Agonists - tizanidine Failed - 07/27/2021 11:58 AM  ?  ?  Failed - This refill cannot be delegated  ?  ?  Passed - Valid encounter within last 6 months  ?  Recent Outpatient Visits   ? ?      ? 2 months ago Pre-diabetes  ? [place], Charlane Ferretti, MD  ? 8 months ago Vasomotor symptoms due to menopause  ? [place], Charlane Ferretti, MD  ? 9 months ago UTI symptoms  ? [place], [place] W, NP  ? 1 year ago Other spondylosis with radiculopathy, cervical region   ? [facility] Charlott Rakes, MD  ? 1 year ago Smoking  ? [place], [place], [place]  ? ?  ?  ? ?  ?  ?  ? ondansetron (ZOFRAN-ODT) 8 MG disintegrating tablet 20 tablet 0  ?  Sig: Dissolve 1 tablet (8 mg total) by mouth daily as needed for nausea or vomiting.  ?  ? Not Delegated - Gastroenterology: Antiemetics - ondansetron Failed - 07/27/2021 11:58 AM  ?  ?  Failed - This refill cannot be delegated  ?  ?  Passed - AST in normal range and within 360 days  ?  AST  ?Date Value Ref Range Status  ?11/24/2020 18 0 - 40 IU/L Final  ?  ?  ?  ?  Passed - ALT in normal range and within 360 days  ?  ALT  ?Date Value Ref Range Status  ?11/24/2020 15 0 - 32 IU/L Final  ?  ?  ?  ?  Passed - Valid encounter within last 6 months  ?  Recent Outpatient Visits   ? ?      ? 2 months ago Pre-diabetes  ? [place], Charlane Ferretti, MD  ? 8 months ago Vasomotor symptoms due to menopause  ? [place], Charlane Ferretti, MD  ? 9 months ago UTI symptoms  ? [place], [place] W, NP  ? 1 year ago Other spondylosis with radiculopathy, cervical region  ? [facility] Charlott Rakes, MD  ? 1 year ago Smoking  ? [place], [place], [place]  ? ?  ?  ? ?  ?  ?  ?Signed Prescriptions Disp Refills  ? rosuvastatin (CRESTOR) 20 MG tablet 30 tablet 2  ?  Sig: TAKE 1 TABLET (20 MG TOTAL) BY MOUTH DAILY. TO LOWER CHOLESTEROL  ?  ? Cardiovascular:  Antilipid - Statins 2 Failed - 07/27/2021 11:58 AM  ?  ?  Failed - Lipid Panel in normal range within the last 12 months  ?  Cholesterol, Total  ?Date Value Ref  Range Status  ?11/24/2020 153 100 - 199 mg/dL Final  ? ?LDL Chol Calc (NIH)  ?Date Value Ref Range Status  ?11/24/2020 57 0 - 99 mg/dL Final  ? ?HDL  ?Date Value Ref Range Status  ?11/24/2020 73 >39 mg/dL Final   ? ?Triglycerides  ?Date Value Ref Range Status  ?11/24/2020 138 0 - 149 mg/dL Final  ? ?  ?  ?  Passed - Cr in normal range and within 360 days  ?  Creat  ?Date Value Ref Range Status  ?11/27/2015 0.75 0.50 - 1.10 mg/dL Final  ? ?Creatinine, Ser  ?Date Value Ref Range Status  ?05/25/2021 0.73 0.57 - 1.00 mg/dL Final  ?  ?  ?  ?  Passed - Patient is not pregnant  ?  ?  Passed - Valid encounter within last 12 months  ?  Recent Outpatient Visits   ? ?      ? 2 months ago Pre-diabetes  ? [place], Charlane Ferretti, MD  ? 8 months ago Vasomotor symptoms due to menopause  ? [place], Charlane Ferretti, MD  ? 9 months ago UTI symptoms  ? [place], [place] W, NP  ? 1 year ago Other spondylosis with radiculopathy, cervical region  ? [facility] Charlott Rakes, MD  ? 1 year ago Smoking  ? [place], [place], [place]  ? ?  ?  ? ?  ?  ?  ? dicyclomine (BENTYL) 10 MG capsule 60 capsule 2  ?  Sig: TAKE 1 CAPSULE BY MOUTH 2 TIMES DAILY AS NEEDED  ?  ? Gastroenterology:  Antispasmodic Agents Passed - 07/27/2021 11:58 AM  ?  ?  Passed - Valid encounter within last 12 months  ?  Recent Outpatient Visits   ? ?      ? 2 months ago Pre-diabetes  ? [place], Charlane Ferretti, MD  ? 8 months ago Vasomotor symptoms due to menopause  ? [place], Charlane Ferretti, MD  ? 9 months ago UTI symptoms  ? [place], [place] W, NP  ? 1 year ago Other spondylosis with radiculopathy, cervical region  ? [facility] Charlott Rakes, MD  ? 1 year ago Smoking  ? [place], [place], [place]  ? ?  ?  ? ?  ?  ?  ?  ?

## 2021-07-28 NOTE — Telephone Encounter (Signed)
Requested Prescriptions  ?Pending Prescriptions Disp Refills  ?? rosuvastatin (CRESTOR) 20 MG tablet 30 tablet 2  ?  Sig: TAKE 1 TABLET (20 MG TOTAL) BY MOUTH DAILY. TO LOWER CHOLESTEROL  ?  ? Cardiovascular:  Antilipid - Statins 2 Failed - 07/27/2021 11:58 AM  ?  ?  Failed - Lipid Panel in normal range within the last 12 months  ?  Cholesterol, Total  ?Date Value Ref Range Status  ?11/24/2020 153 100 - 199 mg/dL Final  ? ?LDL Chol Calc (NIH)  ?Date Value Ref Range Status  ?11/24/2020 57 0 - 99 mg/dL Final  ? ?HDL  ?Date Value Ref Range Status  ?11/24/2020 73 >39 mg/dL Final  ? ?Triglycerides  ?Date Value Ref Range Status  ?11/24/2020 138 0 - 149 mg/dL Final  ? ?  ?  ?  Passed - Cr in normal range and within 360 days  ?  Creat  ?Date Value Ref Range Status  ?11/27/2015 0.75 0.50 - 1.10 mg/dL Final  ? ?Creatinine, Ser  ?Date Value Ref Range Status  ?05/25/2021 0.73 0.57 - 1.00 mg/dL Final  ?   ?  ?  Passed - Patient is not pregnant  ?  ?  Passed - Valid encounter within last 12 months  ?  Recent Outpatient Visits   ?      ? 2 months ago Pre-diabetes  ? [place], Charlane Ferretti, MD  ? 8 months ago Vasomotor symptoms due to menopause  ? [place], Charlane Ferretti, MD  ? 9 months ago UTI symptoms  ? [place], [place] W, NP  ? 1 year ago Other spondylosis with radiculopathy, cervical region  ? [facility] Charlott Rakes, MD  ? 1 year ago Smoking  ? [place], [place], [place]  ?  ?  ? ?  ?  ?  ?? diclofenac (VOLTAREN) 50 MG EC tablet 60 tablet 2  ?  Sig: TAKE 1 TABLET (50 MG TOTAL) BY MOUTH 2 (TWO) TIMES DAILY. AFTER A MEAL AS NEEDED FOR PAIN  ?  ? Analgesics:  NSAIDS Failed - 07/27/2021 11:58 AM  ?  ?  Failed - Manual Review: Labs are only required if the patient has taken medication for more than 8 weeks.  ?  ?  Failed - HGB in normal range and within  360 days  ?  Hemoglobin  ?Date Value Ref Range Status  ?07/30/2020 14.2 12.0 - 15.0 g/dL Final  ?05/28/2020 11.8 11.1 - 15.9 g/dL Final  ?   ?  ?  Failed - PLT in normal range and within 360 days  ?  Platelets  ?Date Value Ref Range Status  ?07/30/2020 234 150 - 400 K/uL Final  ?05/28/2020 257 150 - 450 x10E3/uL Final  ?   ?  ?  Failed - HCT in normal range and within 360 days  ?  HCT  ?Date Value Ref Range Status  ?07/30/2020 42.4 36.0 - 46.0 % Final  ? ?Hematocrit  ?Date Value Ref Range Status  ?05/28/2020 35.3 34.0 - 46.6 % Final  ?   ?  ?  Passed - Cr in normal range and within 360 days  ?  Creat  ?Date Value Ref Range Status  ?11/27/2015 0.75 0.50 - 1.10 mg/dL Final  ? ?Creatinine, Ser  ?Date Value Ref Range Status  ?05/25/2021 0.73 0.57 - 1.00 mg/dL  Final  ?   ?  ?  Passed - eGFR is 30 or above and within 360 days  ?  GFR calc Af Amer  ?Date Value Ref Range Status  ?05/28/2020 69 >59 mL/min/1.73 Final  ?  Comment:  ?  **In accordance with recommendations from the NKF-ASN Task force,** ?  [facility] is in the process of updating its eGFR calculation to the ?  2021 CKD-EPI creatinine equation that estimates kidney function ?  without a race variable. ?  ? ?GFR, Estimated  ?Date Value Ref Range Status  ?07/30/2020 >60 >60 mL/min Final  ?  Comment:  ?  (NOTE) ?Calculated using the CKD-EPI Creatinine Equation (2021) ?  ? ?GFR  ?Date Value Ref Range Status  ?03/24/2020 89.57 >60.00 mL/min Final  ?  Comment:  ?  Calculated using the CKD-EPI Creatinine Equation (2021)  ? ?eGFR  ?Date Value Ref Range Status  ?05/25/2021 101 >59 mL/min/1.73 Final  ?   ?  ?  Passed - Patient is not pregnant  ?  ?  Passed - Valid encounter within last 12 months  ?  Recent Outpatient Visits   ?      ? 2 months ago Pre-diabetes  ? [place], Charlane Ferretti, MD  ? 8 months ago Vasomotor symptoms due to menopause  ? [place], Charlane Ferretti, MD  ? 9 months ago UTI symptoms  ? [place], [place] W, NP  ? 1 year ago Other spondylosis with radiculopathy, cervical region  ? [facility] Charlott Rakes, MD  ? 1 year ago Smoking  ? [place], [place], [place]  ?  ?  ? ?  ?  ?  ?? dicyclomine (BENTYL) 10 MG capsule 60 capsule 2  ?  Sig: TAKE 1 CAPSULE BY MOUTH 2 TIMES DAILY AS NEEDED  ?  ? Gastroenterology:  Antispasmodic Agents Passed - 07/27/2021 11:58 AM  ?  ?  Passed - Valid encounter within last 12 months  ?  Recent Outpatient Visits   ?      ? 2 months ago Pre-diabetes  ? [place], Charlane Ferretti, MD  ? 8 months ago Vasomotor symptoms due to menopause  ? [place], Charlane Ferretti, MD  ? 9 months ago UTI symptoms  ? [place], [place] W, NP  ? 1 year ago Other spondylosis with radiculopathy, cervical region  ? [facility] Charlott Rakes, MD  ? 1 year ago Smoking  ? [place], [place], [place]  ?  ?  ? ?  ?  ?  ?? tiZANidine (ZANAFLEX) 4 MG tablet 90 tablet 0  ?  Sig: START WITH 1/2 TABLET BY MOUTH EVERY 8 HOURS. MAY CAUSE DROWSINEES. INCREASE TO 1 TABLET EVERY 8 HOURS AS NEEDED FOR MUSCLE PAIN IF TOLERABLE.  ?  ? Not Delegated - Cardiovascular:  Alpha-2 Agonists - tizanidine Failed - 07/27/2021 11:58 AM  ?  ?  Failed - This refill cannot be delegated  ?  ?  Passed - Valid encounter within last 6 months  ?  Recent Outpatient Visits   ?      ? 2 months ago Pre-diabetes  ? [place], MD  ? 8 months ago  Vasomotor symptoms due to menopause  ? [place], Charlane Ferretti, MD  ? 9 months ago UTI symptoms  ? [place], [place] W, NP  ? 1 year ago Other spondylosis with radiculopathy, cervical region  ? [facility] Charlott Rakes, MD  ? 1 year ago Smoking  ? [place], [place], [place]  ?  ?  ? ?  ?  ?  ?? ondansetron (ZOFRAN-ODT) 8 MG disintegrating tablet 20 tablet 0  ?  Sig: Dissolve 1 tablet (8 mg total) by mouth daily as needed for nausea or vomiting.  ?  ? Not Delegated - Gastroenterology: Antiemetics - ondansetron Failed - 07/27/2021 11:58 AM  ?  ?  Failed - This refill cannot be delegated  ?  ?  Passed - AST in normal range and within 360 days  ?  AST  ?Date Value Ref Range Status  ?11/24/2020 18 0 - 40 IU/L Final  ?   ?  ?  Passed - ALT in normal range and within 360 days  ?  ALT  ?Date Value Ref Range Status  ?11/24/2020 15 0 - 32 IU/L Final  ?   ?  ?  Passed - Valid encounter within last 6 months  ?  Recent Outpatient Visits   ?      ? 2 months ago Pre-diabetes  ? [place], Charlane Ferretti, MD  ? 8 months ago Vasomotor symptoms due to menopause  ? [place], Charlane Ferretti, MD  ? 9 months ago UTI symptoms  ? [place], [place] W, NP  ? 1 year ago Other spondylosis with radiculopathy, cervical region  ? [facility] Charlott Rakes, MD  ? 1 year ago Smoking  ? [place], [place], [place]  ?  ?  ? ?  ?  ?  ? ?

## 2021-07-29 ENCOUNTER — Other Ambulatory Visit: Payer: Self-pay | Admitting: Family Medicine

## 2021-07-29 ENCOUNTER — Other Ambulatory Visit: Payer: Self-pay

## 2021-07-29 DIAGNOSIS — F411 Generalized anxiety disorder: Secondary | ICD-10-CM

## 2021-07-29 MED ORDER — DICLOFENAC SODIUM 50 MG PO TBEC
DELAYED_RELEASE_TABLET | ORAL | 2 refills | Status: DC
  Filled 2021-07-29: qty 60, 30d supply, fill #0
  Filled 2021-09-04: qty 60, 30d supply, fill #1
  Filled 2021-10-06 – 2021-10-18 (×2): qty 60, 30d supply, fill #2

## 2021-07-29 MED ORDER — TIZANIDINE HCL 4 MG PO TABS
ORAL_TABLET | ORAL | 0 refills | Status: DC
  Filled 2021-07-29: qty 90, 30d supply, fill #0

## 2021-07-29 MED ORDER — FLUTICASONE PROPIONATE 50 MCG/ACT NA SUSP
2.0000 | Freq: Every day | NASAL | 3 refills | Status: DC
  Filled 2021-07-29: qty 16, 30d supply, fill #0
  Filled 2021-09-04: qty 16, 30d supply, fill #1
  Filled 2021-10-06 – 2021-10-18 (×2): qty 16, 30d supply, fill #2
  Filled 2021-11-27: qty 16, 30d supply, fill #3

## 2021-07-29 MED ORDER — ONDANSETRON 8 MG PO TBDP
8.0000 mg | ORAL_TABLET | Freq: Every day | ORAL | 0 refills | Status: DC | PRN
  Filled 2021-07-29: qty 20, 20d supply, fill #0

## 2021-07-30 ENCOUNTER — Other Ambulatory Visit: Payer: Self-pay

## 2021-08-31 NOTE — Telephone Encounter (Signed)
err

## 2021-09-04 ENCOUNTER — Other Ambulatory Visit: Payer: Self-pay | Admitting: Family Medicine

## 2021-09-04 ENCOUNTER — Other Ambulatory Visit: Payer: Self-pay

## 2021-09-04 DIAGNOSIS — R110 Nausea: Secondary | ICD-10-CM

## 2021-09-04 DIAGNOSIS — M542 Cervicalgia: Secondary | ICD-10-CM

## 2021-09-04 DIAGNOSIS — K219 Gastro-esophageal reflux disease without esophagitis: Secondary | ICD-10-CM

## 2021-09-04 DIAGNOSIS — M545 Low back pain, unspecified: Secondary | ICD-10-CM

## 2021-09-04 MED ORDER — OMEPRAZOLE 20 MG PO CPDR
40.0000 mg | DELAYED_RELEASE_CAPSULE | Freq: Every day | ORAL | 2 refills | Status: DC
  Filled 2021-09-04: qty 60, 30d supply, fill #0
  Filled 2021-10-06 – 2021-10-18 (×2): qty 60, 30d supply, fill #1
  Filled 2021-11-27: qty 60, 30d supply, fill #2

## 2021-09-04 MED ORDER — ONDANSETRON 8 MG PO TBDP
8.0000 mg | ORAL_TABLET | Freq: Every day | ORAL | 0 refills | Status: DC | PRN
  Filled 2021-09-04: qty 20, 20d supply, fill #0

## 2021-09-04 MED ORDER — TIZANIDINE HCL 4 MG PO TABS
ORAL_TABLET | ORAL | 0 refills | Status: DC
  Filled 2021-09-04: qty 90, 30d supply, fill #0

## 2021-09-07 ENCOUNTER — Other Ambulatory Visit: Payer: Self-pay

## 2021-09-28 ENCOUNTER — Encounter: Payer: Self-pay | Admitting: Physician Assistant

## 2021-09-28 ENCOUNTER — Ambulatory Visit: Payer: Self-pay | Admitting: Physician Assistant

## 2021-09-28 ENCOUNTER — Other Ambulatory Visit: Payer: Self-pay

## 2021-09-28 ENCOUNTER — Ambulatory Visit: Payer: Self-pay | Admitting: *Deleted

## 2021-09-28 VITALS — BP 110/81 | HR 88 | Temp 97.9°F | Wt 154.0 lb

## 2021-09-28 DIAGNOSIS — J020 Streptococcal pharyngitis: Secondary | ICD-10-CM

## 2021-09-28 MED ORDER — AMOXICILLIN 500 MG PO CAPS
500.0000 mg | ORAL_CAPSULE | Freq: Two times a day (BID) | ORAL | 0 refills | Status: AC
  Filled 2021-09-28: qty 20, 10d supply, fill #0

## 2021-09-28 NOTE — Telephone Encounter (Signed)
Summary: Sore throat advice   Pt is calling to report a Sore throat with white spots, and a headache. There are no available appts Please advise      Reason for Disposition  [1] Pus on tonsils (back of throat) AND [2]  fever AND [3] swollen neck lymph nodes ("glands")  Answer Assessment - Initial Assessment Questions 1. ONSET: "When did the throat start hurting?" (Hours or days ago)      Started yesterday evening 2. SEVERITY: "How bad is the sore throat?" (Scale 1-10; mild, moderate or severe)   - MILD (1-3):  doesn't interfere with eating or normal activities   - MODERATE (4-7): interferes with eating some solids and normal activities   - SEVERE (8-10):  excruciating pain, interferes with most normal activities   - SEVERE DYSPHAGIA: can't swallow liquids, drooling     moderate 3. STREP EXPOSURE: "Has there been any exposure to strep within the past week?" If Yes, ask: "What type of contact occurred?"      unknown 4.  VIRAL SYMPTOMS: "Are there any symptoms of a cold, such as a runny nose, cough, hoarse voice or red eyes?"      Headache, slight fever 5. FEVER: "Do you have a fever?" If Yes, ask: "What is your temperature, how was it measured, and when did it start?"     Yes-100.1 6. PUS ON THE TONSILS: "Is there pus on the tonsils in the back of your throat?"     White patches 7. OTHER SYMPTOMS: "Do you have any other symptoms?" (e.g., difficulty breathing, headache, rash)     headache 8. PREGNANCY: "Is there any chance you are pregnant?" "When was your last menstrual period?"  Protocols used: Sore Throat-A-AH

## 2021-09-28 NOTE — Patient Instructions (Signed)
You are going to take amoxicillin twice a day for 10 days.  Make sure to drink lots of fluids and get plenty of rest  I hope that you feel better soon!  Kennieth Rad, PA-C Physician Assistant [facility] [email or web address]   Strep Throat, Adult Strep throat is an infection in the throat that is caused by bacteria. It is common during the cold months of the year. It mostly affects children who are 14-73 years old. However, people of all ages can get it at any time of the year. This infection spreads from person to person (is contagious) through coughing, sneezing, or having close contact. Your health care provider may use other names to describe the infection. When strep throat affects the tonsils, it is called tonsillitis. When it affects the back of the throat, it is called pharyngitis. What are the causes? This condition is caused by the Streptococcus pyogenes bacteria. What increases the risk? You are more likely to develop this condition if: You care for school-age children, or are around school-age children. Children are more likely to get strep throat and may spread it to others. You spend time in crowded places where the infection can spread easily. You have close contact with someone who has strep throat. What are the signs or symptoms? Symptoms of this condition include: Fever or chills. Redness, swelling, or pain in the tonsils or throat. Pain or difficulty when swallowing. White or yellow spots on the tonsils or throat. Tender glands in the neck and under the jaw. Bad smelling breath. Red rash all over the body. This is rare. How is this diagnosed? This condition is diagnosed by tests that check for the presence and the amount of bacteria that cause strep throat. They are: Rapid strep test. Your throat is swabbed and checked for the presence of bacteria. Results are usually ready in minutes. Throat culture test. Your  throat is swabbed. The sample is placed in a cup that allows infections to grow. Results are usually ready in 1 or 2 days. How is this treated? This condition may be treated with: Medicines that kill germs (antibiotics). Medicines that relieve pain or fever. These include: Ibuprofen or acetaminophen. Aspirin, only for people who are over the age of 50. Throat lozenges. Throat sprays. Follow these instructions at home: Medicines  Take over-the-counter and prescription medicines only as told by your health care provider. Take your antibiotic medicine as told by your health care provider. Do not stop taking the antibiotic even if you start to feel better. Eating and drinking  If you have trouble swallowing, try eating soft foods until your sore throat feels better. Drink enough fluid to keep your urine pale yellow. To help relieve pain, you may have: Warm fluids, such as soup and tea. Cold fluids, such as frozen desserts or popsicles. General instructions Gargle with a salt-water mixture 3-4 times a day or as needed. To make a salt-water mixture, completely dissolve -1 tsp (3-6 g) of salt in 1 cup (237 mL) of warm water. Get plenty of rest. Stay home from work or school until you have been taking antibiotics for 24 hours. Do not use any products that contain nicotine or tobacco. These products include cigarettes, chewing tobacco, and vaping devices, such as e-cigarettes. If you need help quitting, ask your health care provider. It is up to you to get your test results. Ask your health care provider, or the department that is doing the test, when your results will be  ready. Keep all follow-up visits. This is important. How is this prevented?  Do not share food, drinking cups, or personal items that could cause the infection to spread to other people. Wash your hands often with soap and water for at least 20 seconds. If soap and water are not available, use hand sanitizer. Make sure that  all people in your house wash their hands well. Have family members tested if they have a sore throat or fever. They may need an antibiotic if they have strep throat. Contact a health care provider if: You have swelling in your neck that keeps getting bigger. You develop a rash, cough, or earache. You cough up a thick mucus that is green, yellow-brown, or bloody. You have pain or discomfort that does not get better with medicine. Your symptoms seem to be getting worse. You have a fever. Get help right away if: You have new symptoms, such as vomiting, severe headache, stiff or painful neck, chest pain, or shortness of breath. You have severe throat pain, drooling, or changes in your voice. You have swelling of the neck, or the skin on the neck becomes red and tender. You have signs of dehydration, such as tiredness (fatigue), dry mouth, and decreased urination. You become increasingly sleepy, or you cannot wake up completely. Your joints become red or painful. These symptoms may represent a serious problem that is an emergency. Do not wait to see if the symptoms will go away. Get medical help right away. Call your local emergency services (911 in the [place] Do not drive yourself to the hospital. Summary Strep throat is an infection in the throat that is caused by the Streptococcus pyogenes bacteria. This infection is spread from person to person (is contagious) through coughing, sneezing, or having close contact. Take your medicines, including antibiotics, as told by your health care provider. Do not stop taking the antibiotic even if you start to feel better. To prevent the spread of germs, wash your hands well with soap and water. Have others do the same. Do not share food, drinking cups, or personal items. Get help right away if you have new symptoms, such as vomiting, severe headache, stiff or painful neck, chest pain, or shortness of breath. This information is not intended to replace  advice given to you by your health care provider. Make sure you discuss any questions you have with your health care provider. Document Revised: 07/29/2020 Document Reviewed: 07/29/2020 Elsevier Patient Education  [place].

## 2021-09-28 NOTE — Telephone Encounter (Signed)
  Chief Complaint: sore throat Symptoms: pus on tonsils, fever, headache Frequency: stared yesterday Pertinent Negatives: Patient denies cold symptoms Disposition: '[]'$ ED /'[]'$ Urgent Care (no appt availability in office) / '[]'$ Appointment(In office/virtual)/ '[]'$  [facility]/ '[]'$ Home Care/ '[]'$ Refused Recommended Disposition /'[x]'$ [facility]/ '[]'$  Follow-up with PCP Additional Notes: Patient has orange card- referred to mobile unit

## 2021-09-28 NOTE — Progress Notes (Signed)
Established Patient Office Visit  Subjective   Patient ID: Isabel Evans, female    DOB: 02/27/1971  Age: 51 y.o. MRN: [id number]  Chief Complaint  Patient presents with   Sore Throat    States that she started having a sore throat and headache 2 days ago, states that yesterday she had an unmeasured fever.  States today she noticed white spots on her tonsils.  States that her left ear has started feeling uncomfortable as well.  States that she does not know of any sick contacts, but did go swimming on Friday and went to a concert on Saturday.  States that she has tried salt water and Listerine, and Tylenol with mild relief   No recent antibiotic use    Past Medical History:  Diagnosis Date   Anemia 11/24/2017   Anxiety    Bone spur    cervical spine   Chronic kidney disease 04/2017   kidney infections   Depression    Deviated nasal septum    states is unable to lie flat, because her nose will become congested and she can stop breathing   Diabetes mellitus without complication (HCC)    Dysrhythmia    heart flutters occasionally   Fibromyalgia    Fibromyalgia    GERD (gastroesophageal reflux disease)    Hallux abductovalgus with bunions 09/2014   right    Hammertoe 09/2014   right 4th, 5th   History of stomach ulcers    Hyperlipidemia    Hypertension    states is under control with med., has been on med. x 8 mos.   IBS (irritable bowel syndrome)    no current med.   Osteoarthritis    hands, bilateral hips   Peripheral vascular disease (HCC)    occasional swelling in both legs   Sinus headache    Social History   Socioeconomic History   Marital status: Single    Spouse name: Not on file   Number of children: 3   Years of education: Not on file   Highest education level: 7th grade  Occupational History   Occupation: cook  Tobacco Use   Smoking status: Former    Years: 20.00    Types: Cigarettes, E-cigarettes    Start date: 01/19/2021    Quit date:  08/24/2012    Years since quitting: 9.1   Smokeless tobacco: Never  Vaping Use   Vaping Use: Every day   Substances: Flavoring  Substance and Sexual Activity   Alcohol use: Yes    Comment: occasionally   Drug use: Yes    Types: Marijuana    Comment: occ.   Sexual activity: Yes    Partners: Male    Birth control/protection: Surgical    Comment: 1 partner  Other Topics Concern   Not on file  Social History Narrative   Not on file   Social Determinants of Health   Financial Resource Strain: Low Risk  (02/25/2021)   Overall Financial Resource Strain (CARDIA)    Difficulty of Paying Living Expenses: Not very hard  Food Insecurity: No Food Insecurity (02/19/2021)   Hunger Vital Sign    Worried About Running Out of Food in the Last Year: Never true    Ran Out of Food in the Last Year: Never true  Transportation Needs: No Transportation Needs (02/19/2021)   PRAPARE - Hydrologist (Medical): No    Lack of Transportation (Non-Medical): No  Physical Activity: Insufficiently Active (02/25/2021)  Exercise Vital Sign    Days of Exercise per Week: 1 day    Minutes of Exercise per Session: 30 min  Stress: No Stress Concern Present (02/25/2021)   [place]    Feeling of Stress : Only a little  Social Connections: Moderately Isolated (02/25/2021)   Social Connection and Isolation Panel [NHANES]    Frequency of Communication with Friends and Family: Twice a week    Frequency of Social Gatherings with Friends and Family: Once a week    Attends Religious Services: 1 to 4 times per year    Active Member of [employer] or Organizations: No    Attends Archivist Meetings: Never    Marital Status: Never married  Intimate Partner Violence: Not At Risk (02/25/2021)   Humiliation, Afraid, Rape, and Kick questionnaire    Fear of Current or Ex-Partner: No    Emotionally Abused: No    Physically Abused:  No    Sexually Abused: No   Family History  Problem Relation Age of Onset   Hypertension Mother    Diabetes Mother    Heart disease Mother    Hypertension Father    Diabetes Father    Prostate cancer Father    Heart disease Father    Alcohol abuse Father    Hypertension Sister    Diabetes Sister    Heart disease Maternal Grandmother        Great GM   Breast cancer Maternal Grandmother    Bone cancer Maternal Grandmother    Breast cancer Maternal Aunt    Ovarian cancer Maternal Aunt    Rheum arthritis Sister    Colon cancer Other        great Aunt   Rectal cancer Neg Hx    Stomach cancer Neg Hx    No Known Allergies    Review of Systems  Constitutional:  Negative for chills.  HENT:  Positive for sore throat. Negative for ear discharge and sinus pain.   Eyes: Negative.   Respiratory:  Negative for cough, sputum production and shortness of breath.   Cardiovascular:  Negative for chest pain.  Gastrointestinal:  Negative for abdominal pain, diarrhea, nausea and vomiting.  Genitourinary: Negative.   Musculoskeletal:  Negative for myalgias.  Skin: Negative.   Neurological:  Positive for headaches.  Endo/Heme/Allergies: Negative.   Psychiatric/Behavioral: Negative.        Objective:     BP 110/81 (BP Location: Left Arm)   Pulse 88   Temp 97.9 F (36.6 C)   Wt 154 lb (69.9 kg)   LMP 07/04/2017   SpO2 98%   BMI 22.74 kg/m    Physical Exam Vitals reviewed.  Constitutional:      Appearance: Normal appearance.  HENT:     Head: Normocephalic.     Salivary Glands: Right salivary gland is diffusely enlarged and tender. Left salivary gland is diffusely enlarged and tender.     Right Ear: Tympanic membrane, ear canal and external ear normal.     Left Ear: Tympanic membrane, ear canal and external ear normal.     Nose: Nose normal.     Right Sinus: No maxillary sinus tenderness or frontal sinus tenderness.     Left Sinus: No maxillary sinus tenderness or frontal  sinus tenderness.     Mouth/Throat:     Lips: Pink.     Mouth: Mucous membranes are moist.     Pharynx: Oropharyngeal exudate and posterior oropharyngeal erythema  present.     Tonsils: Tonsillar exudate present. 1+ on the right. 1+ on the left.  Eyes:     Extraocular Movements: Extraocular movements intact.     Conjunctiva/sclera: Conjunctivae normal.     Pupils: Pupils are equal, round, and reactive to light.  Cardiovascular:     Rate and Rhythm: Normal rate and regular rhythm.     Pulses: Normal pulses.     Heart sounds: Normal heart sounds.  Pulmonary:     Effort: Pulmonary effort is normal.     Breath sounds: Normal breath sounds.  Musculoskeletal:        General: Normal range of motion.     Cervical back: Normal range of motion and neck supple.  Lymphadenopathy:     Cervical: Cervical adenopathy present.  Skin:    General: Skin is warm and dry.  Neurological:     General: No focal deficit present.     Mental Status: She is alert and oriented to person, place, and time.  Psychiatric:        Mood and Affect: Mood normal.        Behavior: Behavior normal.        Thought Content: Thought content normal.        Judgment: Judgment normal.      Assessment & Plan:   Problem List Items Addressed This Visit   None Visit Diagnoses     Strep pharyngitis    -  Primary   Relevant Medications   amoxicillin (AMOXIL) 500 MG capsule     1. Strep pharyngitis No POCT strep available, treatment based on clinical presentation.  Trial amoxicillin.  Patient education given on supportive care, red flags given for prompt reevaluation. - amoxicillin (AMOXIL) 500 MG capsule; Take 1 capsule (500 mg total) by mouth 2 (two) times daily for 10 days.  Dispense: 20 capsule; Refill: 0   I have reviewed the patient's medical history (PMH, PSH, Social History, Family History, Medications, and allergies) , and have been updated if relevant. I spent 20 minutes reviewing chart and  face to face time  with patient.    Return if symptoms worsen or fail to improve.    Loraine Grip Mayers, PA-C

## 2021-09-29 NOTE — Telephone Encounter (Signed)
Called pt and left vm about where the mobile bus be today

## 2021-10-06 ENCOUNTER — Other Ambulatory Visit: Payer: Self-pay | Admitting: Family Medicine

## 2021-10-06 DIAGNOSIS — R7303 Prediabetes: Secondary | ICD-10-CM

## 2021-10-06 DIAGNOSIS — R110 Nausea: Secondary | ICD-10-CM

## 2021-10-06 DIAGNOSIS — M545 Low back pain, unspecified: Secondary | ICD-10-CM

## 2021-10-06 DIAGNOSIS — M542 Cervicalgia: Secondary | ICD-10-CM

## 2021-10-07 ENCOUNTER — Other Ambulatory Visit: Payer: Self-pay

## 2021-10-07 MED ORDER — TIZANIDINE HCL 4 MG PO TABS
ORAL_TABLET | ORAL | 0 refills | Status: DC
  Filled 2021-10-07: qty 30, 10d supply, fill #0
  Filled 2021-10-18: qty 90, 30d supply, fill #0

## 2021-10-07 MED ORDER — ONDANSETRON 8 MG PO TBDP
8.0000 mg | ORAL_TABLET | Freq: Every day | ORAL | 0 refills | Status: DC | PRN
  Filled 2021-10-07 – 2021-10-18 (×2): qty 20, 20d supply, fill #0

## 2021-10-07 MED ORDER — METFORMIN HCL 500 MG PO TABS
ORAL_TABLET | Freq: Every day | ORAL | 0 refills | Status: DC
  Filled 2021-10-07 – 2021-10-18 (×2): qty 30, 30d supply, fill #0

## 2021-10-08 ENCOUNTER — Other Ambulatory Visit: Payer: Self-pay

## 2021-10-13 ENCOUNTER — Other Ambulatory Visit: Payer: Self-pay

## 2021-10-13 ENCOUNTER — Telehealth (INDEPENDENT_AMBULATORY_CARE_PROVIDER_SITE_OTHER): Payer: No Payment, Other | Admitting: Psychiatry

## 2021-10-13 ENCOUNTER — Encounter (HOSPITAL_COMMUNITY): Payer: Self-pay | Admitting: Psychiatry

## 2021-10-13 DIAGNOSIS — F331 Major depressive disorder, recurrent, moderate: Secondary | ICD-10-CM

## 2021-10-13 DIAGNOSIS — F411 Generalized anxiety disorder: Secondary | ICD-10-CM | POA: Diagnosis not present

## 2021-10-13 MED ORDER — ESCITALOPRAM OXALATE 20 MG PO TABS
20.0000 mg | ORAL_TABLET | Freq: Every day | ORAL | 2 refills | Status: DC
Start: 2021-10-13 — End: 2022-01-26
  Filled 2021-10-18: qty 30, 30d supply, fill #0
  Filled 2021-11-27: qty 30, 30d supply, fill #1
  Filled 2021-12-30: qty 30, 30d supply, fill #2

## 2021-10-13 MED ORDER — QUETIAPINE FUMARATE 100 MG PO TABS
100.0000 mg | ORAL_TABLET | Freq: Every day | ORAL | 0 refills | Status: DC
  Filled 2021-10-13: qty 90, 90d supply, fill #0

## 2021-10-13 MED ORDER — GABAPENTIN 300 MG PO CAPS
300.0000 mg | ORAL_CAPSULE | Freq: Three times a day (TID) | ORAL | 0 refills | Status: DC
  Filled 2021-10-18: qty 90, 30d supply, fill #0
  Filled 2021-11-27: qty 90, 30d supply, fill #1
  Filled 2021-12-30: qty 90, 30d supply, fill #2

## 2021-10-13 NOTE — Progress Notes (Signed)
[facility] NP Progress Note  Patient Identification: Isabel Evans MRN:  [id number] Date of Evaluation:  10/13/2021 Chief Complaint:  Depression and anxiety Chief Complaint   Anxiety; Depression; Follow-up     Visit Diagnosis:  Major depressive disorder, moderate; General anxiety disorder  History of Present Illness:   51 yo female with depression and anxiety.  Her depression increased over the past week due to situations.  She quit her job and before she could serve her 2 weeks notice, they stopped scheduling her.  Now, she is worried about finances and awaiting disability for her back and hand issues. Supportive boyfriend and sister.  Depression is moderate with no suicidal ideations.  High anxiety related to her stress, no panic attacks.  No psychosis or homicidal ideations.  Sleep is fair to poor related to anxiety, increased Seroquel to help.  Appetite fluctuates, no weight changes. Denies side effects from her medications, follow-up in 3 months.  Past Psychiatric History: depression and anxiety   Past Medical History:  Past Medical History:  Diagnosis Date   Anemia 11/24/2017   Anxiety    Bone spur    cervical spine   Chronic kidney disease 04/2017   kidney infections   Depression    Deviated nasal septum    states is unable to lie flat, because her nose will become congested and she can stop breathing   Diabetes mellitus without complication (HCC)    Dysrhythmia    heart flutters occasionally   Fibromyalgia    Fibromyalgia    GERD (gastroesophageal reflux disease)    Hallux abductovalgus with bunions 09/2014   right    Hammertoe 09/2014   right 4th, 5th   History of stomach ulcers    Hyperlipidemia    Hypertension    states is under control with med., has been on med. x 8 mos.   IBS (irritable bowel syndrome)    no current med.   Osteoarthritis    hands, bilateral hips   Peripheral vascular disease (HCC)    occasional swelling in both legs   Sinus headache      Past Surgical History:  Procedure Laterality Date   ABDOMINAL HYSTERECTOMY     ANTERIOR CERVICAL DECOMP/DISCECTOMY FUSION N/A 07/30/2020   Procedure: C5-6 ANTERIOR CERVICAL DECOMPRESSION/DISCECTOMY FUSION, ALLOGRAFT, PLATE;  Surgeon: Marybelle Killings, MD;  Location: [facility];  Service: Orthopedics;  Laterality: N/A;   BREAST BIOPSY Left 04/2018   benign   BREAST BIOPSY Left 2016   benign   BUNIONECTOMY Right 10/04/2014   Procedure:  Altamese Taos, Emmaline Life;  Surgeon: Trula Slade, DPM;  Location: [facility];  Service: Podiatry;  Laterality: Right;   COLONOSCOPY WITH PROPOFOL  02/13/2013   CYSTOSCOPY N/A 07/07/2017   Procedure: CYSTOSCOPY;  Surgeon: Aletha Halim, MD;  Location: [facility];  Service: Gynecology;  Laterality: N/A;   DUPUYTREN / PALMAR FASCIOTOMY Right 07/18/2013   exc. 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   small finger   HAMMER TOE SURGERY Right 10/04/2014   Procedure: HAMMER TOE REPAIR 4TH  AND 5TH  RIGHT FOOT  fourth toe fixation with k wire;  Surgeon: Trula Slade, DPM;  Location: [facility];  Service: Podiatry;  Laterality: Right;   LEG SURGERY Left    as a child; near amputation of leg   TUBAL LIGATION     VAGINAL HYSTERECTOMY N/A 07/07/2017   Procedure: HYSTERECTOMY VAGINAL;  Surgeon: Aletha Halim, MD;  Location: [facility];  Service: Gynecology;  Laterality: N/A;    Family Psychiatric History: see below, mother and sister with depression and anxiety  Family History:  Family History  Problem Relation Age of Onset   Hypertension Mother    Diabetes Mother    Heart disease Mother    Hypertension Father    Diabetes Father    Prostate cancer Father    Heart disease Father    Alcohol abuse Father    Hypertension Sister    Diabetes Sister    Heart disease Maternal Grandmother        Great GM   Breast cancer Maternal Grandmother    Bone cancer Maternal Grandmother    Breast cancer  Maternal Aunt    Ovarian cancer Maternal Aunt    Rheum arthritis Sister    Colon cancer Other        great Aunt   Rectal cancer Neg Hx    Stomach cancer Neg Hx     Social History:   Social History   Socioeconomic History   Marital status: Single    Spouse name: Not on file   Number of children: 3   Years of education: Not on file   Highest education level: 7th grade  Occupational History   Occupation: Training and development officer  Tobacco Use   Smoking status: Former    Years: 20.00    Types: Cigarettes, E-cigarettes    Start date: 01/19/2021    Quit date: 08/24/2012    Years since quitting: 9.1   Smokeless tobacco: Never  Vaping Use   Vaping Use: Every day   Substances: Flavoring  Substance and Sexual Activity   Alcohol use: Yes    Comment: occasionally   Drug use: Yes    Types: Marijuana    Comment: occ.   Sexual activity: Yes    Partners: Male    Birth control/protection: Surgical    Comment: 1 partner  Other Topics Concern   Not on file  Social History Narrative   Not on file   Social Determinants of Health   Financial Resource Strain: Low Risk  (02/25/2021)   Overall Financial Resource Strain (CARDIA)    Difficulty of Paying Living Expenses: Not very hard  Food Insecurity: No Food Insecurity (02/19/2021)   Hunger Vital Sign    Worried About Running Out of Food in the Last Year: Never true    Ran Out of Food in the Last Year: Never true  Transportation Needs: No Transportation Needs (02/19/2021)   PRAPARE - Hydrologist (Medical): No    Lack of Transportation (Non-Medical): No  Physical Activity: Insufficiently Active (02/25/2021)   Exercise Vital Sign    Days of Exercise per Week: 1 day    Minutes of Exercise per Session: 30 min  Stress: No Stress Concern Present (02/25/2021)   [place]    Feeling of Stress : Only a little  Social Connections: Moderately Isolated (02/25/2021)    Social Connection and Isolation Panel [NHANES]    Frequency of Communication with Friends and Family: Twice a week    Frequency of Social Gatherings with Friends and Family: Once a week    Attends Religious Services: 1 to 4 times per year    Active Member of [employer] or Organizations: No    Attends Archivist Meetings: Never    Marital Status: Never married    Additional Social History: lives with supportive partner, works part-time  Allergies:  No Known Allergies  Metabolic Disorder Labs: Lab Results  Component Value Date   HGBA1C 5.4 05/25/2021   No results found for: "PROLACTIN" Lab Results  Component Value Date   CHOL 153 11/24/2020   TRIG 138 11/24/2020   HDL 73 11/24/2020   CHOLHDL 2.1 11/24/2020   LDLCALC 57 11/24/2020   LDLCALC 62 08/24/2019   Lab Results  Component Value Date   TSH 0.606 01/04/2020    Therapeutic Level Labs: No results found for: "LITHIUM" No results found for: "CBMZ" No results found for: "VALPROATE"  Current Medications: Current Outpatient Medications  Medication Sig Dispense Refill   diclofenac (VOLTAREN) 50 MG EC tablet TAKE 1 TABLET (50 MG TOTAL) BY MOUTH 2 (TWO) TIMES DAILY. AFTER A MEAL AS NEEDED FOR PAIN 60 tablet 2   QUEtiapine (SEROQUEL) 100 MG tablet Take 1 tablet (100 mg total) by mouth at bedtime. 90 tablet 0   dicyclomine (BENTYL) 10 MG capsule TAKE 1 CAPSULE BY MOUTH 2 TIMES DAILY AS NEEDED 60 capsule 2   diphenhydrAMINE (BENADRYL) 25 MG tablet Take 1 tablet (25 mg total) by mouth daily. 30 tablet 0   escitalopram (LEXAPRO) 20 MG tablet Take 1 tablet (20 mg total) by mouth daily. 30 tablet 2   estradiol (ESTRACE) 0.5 MG tablet Take 1 tablet (0.5 mg total) by mouth daily. 90 tablet 3   fluticasone (FLONASE) 50 MCG/ACT nasal spray PLACE 2 SPRAYS INTO BOTH NOSTRILS DAILY. 16 g 3   gabapentin (NEURONTIN) 300 MG capsule Take 1 capsule (300 mg total) by mouth 3 (three) times daily. 270 capsule 0   lidocaine (LIDODERM) 5 %  Place 1 patch onto the skin daily. Remove & Discard patch within 12 hours or as directed by MD 30 patch 6   metFORMIN (GLUCOPHAGE) 500 MG tablet TAKE 1 TABLET (500 MG TOTAL) BY MOUTH DAILY WITH BREAKFAST. 30 tablet 0   omeprazole (PRILOSEC) 20 MG capsule TAKE 2 CAPSULES (40 MG TOTAL) BY MOUTH DAILY. 60 capsule 2   ondansetron (ZOFRAN-ODT) 8 MG disintegrating tablet Dissolve 1 tablet (8 mg total) by mouth daily as needed for nausea or vomiting. 20 tablet 0   rosuvastatin (CRESTOR) 20 MG tablet TAKE 1 TABLET (20 MG TOTAL) BY MOUTH DAILY. TO LOWER CHOLESTEROL 30 tablet 2   tiZANidine (ZANAFLEX) 4 MG tablet START WITH 1/2 TABLET BY MOUTH EVERY 8 HOURS. MAY CAUSE DROWSINEES. INCREASE TO 1 TABLET EVERY 8 HOURS AS NEEDED FOR MUSCLE PAIN IF TOLERABLE. 90 tablet 0   traMADol (ULTRAM) 50 MG tablet Take 1 tablet (50 mg total) by mouth 3 (three) times daily as needed. 30 tablet 2   No current facility-administered medications for this visit.    Musculoskeletal: Strength & Muscle Tone: denies issues Gait & Station: denies issues Patient leans: NA  Psychiatric Specialty Exam: Review of Systems  Musculoskeletal:  Positive for back pain.       Hand issues  Psychiatric/Behavioral:  Positive for dysphoric mood. The patient is nervous/anxious.   All other systems reviewed and are negative.   Last menstrual period 07/04/2017.There is no height or weight on file to calculate BMI.  General Appearance UTA per phone  Eye Contact:  UTA  Speech:  Normal Rate  Volume:  Normal  Mood:  Anxious and Depressed  Affect:  Congruent  Thought Process:  Coherent and Descriptions of Associations: Intact  Orientation:  Full (Time, Place, and Person)  Thought Content:  WDL and Logical  Suicidal Thoughts:  No  Homicidal Thoughts:  No  Memory:  Immediate;   Good Recent;   Good Remote;   Good  Judgement:  Good  Insight:  Good  Psychomotor Activity:  Normal  Concentration:  Concentration: Good and Attention Span: Good   Recall:  Good  Fund of Knowledge:Good  Language: Good  Akathisia:  No  Handed:  Right  AIMS (if indicated):  not needed  Assets:  Housing Intimacy Leisure Time Resilience Social Support  ADL's:  Intact  Cognition: WNL  Sleep:  fair to poor   Screenings: AUDIT    Health and safety inspector from 02/25/2021 in [facility]  Alcohol Use Disorder Identification Test Final Score (AUDIT) 4      GAD-7    Flowsheet Row Office Visit from 05/25/2021 in [facility] Office Visit from 04/08/2021 in [facility] Counselor from 02/25/2021 in [facility] Office Visit from 11/20/2020 in [facility] Office Visit from 06/19/2020 in [facility]  Total GAD-7 Score '12 16 14 13 14      '$ PHQ2-9    [facility] Office Visit from 05/25/2021 in [facility] Office Visit from 04/08/2021 in [facility] [place] [facility] Counselor from 02/25/2021 in [facility] Office Visit from 11/20/2020 in [facility] Office Visit from 06/19/2020 in [facility]  PHQ-2 Total Score '2 4 4 4 4  '$ PHQ-9 Total Score '12 17 13 16 15      '$ Flowsheet Row Counselor from 02/25/2021 in [facility] Admission (Discharged) from 07/30/2020 in [facility] Video Visit from 06/09/2020 in [facility] No Risk No Risk No Risk       Assessment and Plan:  General anxiety disorder and panic disorder: -Continue gabapentin 300 mg TID for anxiety and neuropathic pain  Major Depressive Disorder, recurrent, moderate -Continue Lexapro 20 mg daily -Continue therapy  Chronic back Pain: -Continue care with  MD  Insomnia: -Increased Seroquel 50 mg at bedtime PRN sleep to 100 mg PRN  Virtual Visit via Telephone Note  I connected with Isabel Evans on 10/13/21 at 10:30 AM EDT by telephone and verified that I am speaking with the correct person using two identifiers.  Location: Patient: home Provider: home office   I discussed the limitations, risks, security and privacy concerns of performing an evaluation and management service by telephone and the availability of in person appointments. I also discussed with the patient that there may be a patient responsible charge related to this service. The patient expressed understanding and agreed to proceed.  Follow Up Instructions: Follow up in 3 months   I discussed the assessment and treatment plan with the patient. The patient was provided an opportunity to ask questions and all were answered. The patient agreed with the plan and demonstrated an understanding of the instructions.   The patient was advised to call back or seek an in-person evaluation if the symptoms worsen or if the condition fails to improve as anticipated.  I provided 10 minutes of non-face-to-face time during this encounter.   Waylan Boga, NP   Waylan Boga, NP 6/27/20234:13 PM

## 2021-10-14 ENCOUNTER — Other Ambulatory Visit: Payer: Self-pay

## 2021-10-19 ENCOUNTER — Other Ambulatory Visit: Payer: Self-pay

## 2021-10-22 ENCOUNTER — Other Ambulatory Visit: Payer: Self-pay

## 2021-10-23 ENCOUNTER — Ambulatory Visit: Payer: Self-pay | Admitting: *Deleted

## 2021-10-23 NOTE — Telephone Encounter (Signed)
Summary: Pain right ear advice   Pt is calling to report pain in her right ear. About 3 weeks ago Pt had strept throat. No available appt [facility]. Please advise        Chief Complaint: right ear pain Symptoms: right ear sounds are muffled. Patient went swimming 10/20/21 not sure if this is the cause. S/p strep throat 3 weeks ago  Frequency: 2 days ago  Pertinent Negatives: Patient denies dizziness, no headache, no runny nose no stiff neck Disposition: '[]'$ ED /'[]'$ Urgent Care (no appt availability in office) / '[]'$ Appointment(In office/virtual)/ '[]'$  [place] Virtual [facility]/ '[x]'$ Home Care/ '[]'$ Refused Recommended Disposition /'[]'$  Mobile Bus/ '[]'$  Follow-up with PCP Additional Notes:   Requesting recommendations from PCP if medication if needed. Please advise. Recommended if pain worsens after care advise call back or can do UC VV or go to UC.       Reason for Disposition  Mild earache and ear congestion (fullness) occurring during air travel  Answer Assessment - Initial Assessment Questions 1. LOCATION: "Which ear is involved?"     Right ear  2. ONSET: "When did the ear start hurting"      Approx. 2 days ago  3. SEVERITY: "How bad is the pain?"  (Scale 1-10; mild, moderate or severe)   - MILD (1-3): doesn't interfere with normal activities    - MODERATE (4-7): interferes with normal activities or awakens from sleep    - SEVERE (8-10): excruciating pain, unable to do any normal activities      Mild to moderate level 4  4. URI SYMPTOMS: "Do you have a runny nose or cough?"     No, had strep throat over 3 weeks ago  5. FEVER: "Do you have a fever?" If Yes, ask: "What is your temperature, how was it measured, and when did it start?"     Na  6. CAUSE: "Have you been swimming recently?", "How often do you use Q-TIPS?", "Have you had any recent air travel or scuba diving?"     Swimming on 10/20/21 7. OTHER SYMPTOMS: "Do you have any other symptoms?" (e.g., headache, stiff neck, dizziness,  vomiting, runny nose, decreased hearing)     Sounds muffled right ear  8. PREGNANCY: "Is there any chance you are pregnant?" "When was your last menstrual period?"     na  Protocols used: Bethann Punches

## 2021-10-28 ENCOUNTER — Ambulatory Visit: Payer: Self-pay | Admitting: Physician Assistant

## 2021-10-29 ENCOUNTER — Other Ambulatory Visit: Payer: Self-pay

## 2021-10-29 ENCOUNTER — Ambulatory Visit: Payer: Self-pay | Admitting: *Deleted

## 2021-10-29 ENCOUNTER — Encounter: Payer: Self-pay | Admitting: Physician Assistant

## 2021-10-29 ENCOUNTER — Ambulatory Visit: Payer: Self-pay | Admitting: Physician Assistant

## 2021-10-29 VITALS — BP 97/68 | HR 66 | Resp 19 | Ht 71.0 in | Wt 157.0 lb

## 2021-10-29 DIAGNOSIS — H60331 Swimmer's ear, right ear: Secondary | ICD-10-CM

## 2021-10-29 MED ORDER — NEOMYCIN-POLYMYXIN-HC 3.5-10000-1 OT SOLN
4.0000 [drp] | Freq: Four times a day (QID) | OTIC | 0 refills | Status: AC
  Filled 2021-10-29: qty 10, 13d supply, fill #0

## 2021-10-29 NOTE — Telephone Encounter (Signed)
  Chief Complaint: right earache worsening  Symptoms: right ear pain severe, c/o "feels like heart beating" in ear. Difficulty hearing , painful to touch ear  Frequency: last night  Pertinent Negatives: Patient denies fever, no drainage from ear  Disposition: '[]'$ ED /'[]'$ Urgent Care (no appt availability in office) / '[]'$ Appointment(In office/virtual)/ '[]'$  [place] Virtual [facility]/ '[]'$ Home Care/ '[]'$ Refused Recommended Disposition /'[x]'$ [place] Mobile Bus/ '[]'$  Follow-up with PCP Additional Notes: na   Reason for Disposition  Earache persists > 1 hour  Answer Assessment - Initial Assessment Questions 1. LOCATION: "Which ear is involved?"       Right ear  2. SENSATION: "Describe how the ear feels." (e.g. stuffy, full, plugged)."      Full , pain , feels like heart pulsating  3. ONSET:  "When did the ear symptoms start?"       Last night  4. PAIN: "Do you also have an earache?" If Yes, ask: "How bad is it?" (Scale 1-10; or mild, moderate, severe)     Severe  5. CAUSE: "What do you think is causing the ear congestion?"     Not sure  6. URI: "Do you have a runny nose or cough?"      No  7. NASAL ALLERGIES: "Are there symptoms of hay fever, such as sneezing or a clear nasal discharge?"     Seasonal allergies  8. PREGNANCY: "Is there any chance you are pregnant?" "When was your last menstrual period?"     na  Protocols used: Ear - Congestion-A-AH

## 2021-10-29 NOTE — Telephone Encounter (Signed)
Pt has been seen on mobile unit.

## 2021-10-29 NOTE — Patient Instructions (Signed)
You are going to use the prescription drops 4 drops in your right ear 4 times a day for 10 days.  I encourage you to return to the mobile unit when you are near the end of your treatment so we can take a look in your ear once more.  I hope that you feel better soon, please let us know if there is anything else we can do for you  Kennieth Rad, PA-C Physician Assistant [place] [email or web address]   Otitis Externa  Otitis externa is an infection of the outer ear canal. The outer ear canal is the area between the outside of the ear and the eardrum. Otitis externa is sometimes called swimmer's ear. What are the causes? Common causes of this condition include: Swimming in dirty water. Moisture in the ear. An injury to the inside of the ear. An object stuck in the ear. A cut or scrape on the outside of the ear or in the ear canal. What increases the risk? You are more likely to develop this condition if you go swimming often. What are the signs or symptoms? The first symptom of this condition is often itching in the ear. Later symptoms of the condition include: Swelling of the ear. Redness in the ear. Ear pain. The pain may get worse when you pull on your ear. Pus coming from the ear. How is this diagnosed? This condition may be diagnosed by examining the ear and testing fluid from the ear for bacteria and funguses. How is this treated? This condition may be treated with: Antibiotic ear drops. These are often given for 10-14 days. Medicines to reduce itching and swelling. Follow these instructions at home: If you were prescribed antibiotic ear drops, use them as told by your health care provider. Do not stop using the antibiotic even if you start to feel better. Take over-the-counter and prescription medicines only as told by your health care provider. Avoid getting water in your ears as told by your health care provider.  This may include avoiding swimming or water sports for a few days. Keep all follow-up visits. This is important. How is this prevented? Keep your ears dry. Use the corner of a towel to dry your ears after you swim or bathe. Avoid scratching or putting things in your ear. Doing these things can damage the ear canal or remove the protective wax that lines it, which makes it easier for bacteria and funguses to grow. Avoid swimming in lakes, polluted water, or swimming pools that may not have enough chlorine. Contact a health care provider if: You have a fever. Your ear is still red, swollen, painful, or draining pus after 3 days. Your redness, swelling, or pain gets worse. You have a severe headache. Get help right away if: You have redness, swelling, and pain or tenderness in the area behind your ear. Summary Otitis externa is an infection of the outer ear canal. Common causes include swimming in dirty water, moisture in the ear, or a cut or scrape in the ear. Symptoms include pain, redness, and swelling of the ear canal. If you were prescribed antibiotic ear drops, use them as told by your health care provider. Do not stop using the antibiotic even if you start to feel better. This information is not intended to replace advice given to you by your health care provider. Make sure you discuss any questions you have with your health care provider. Document Revised: 06/18/2020 Document Reviewed: 06/18/2020 Elsevier Patient Education  2023 [employer]  

## 2021-10-29 NOTE — Progress Notes (Signed)
Established Patient Office Visit  Subjective   Patient ID: Isabel Evans, female    DOB: [birth year]  Age: 51 y.o. MRN: [id number]  Chief Complaint  Patient presents with   Otalgia    States that she went swimming approximately 10 days ago and started having pain in her right ear shortly after.  States that she has had a small amount of clear discharge.  Endorses pain.  States that she has tried using [employer] with little relief.  States that she has also tried chewing gum, using Benadryl, and hydrogen peroxide without relief.  Was treated in the middle of June for strep throat, states that she did finish the course of amoxicillin.      Past Medical History:  Diagnosis Date   Anemia 11/24/2017   Anxiety    Bone spur    cervical spine   Chronic kidney disease 04/2017   kidney infections   Depression    Deviated nasal septum    states is unable to lie flat, because her nose will become congested and she can stop breathing   Diabetes mellitus without complication (HCC)    Dysrhythmia    heart flutters occasionally   Fibromyalgia    Fibromyalgia    GERD (gastroesophageal reflux disease)    Hallux abductovalgus with bunions 09/2014   right    Hammertoe 09/2014   right 4th, 5th   History of stomach ulcers    Hyperlipidemia    Hypertension    states is under control with med., has been on med. x 8 mos.   IBS (irritable bowel syndrome)    no current med.   Osteoarthritis    hands, bilateral hips   Peripheral vascular disease (HCC)    occasional swelling in both legs   Sinus headache    Social History   Socioeconomic History   Marital status: Single    Spouse name: Not on file   Number of children: 3   Years of education: Not on file   Highest education level: 7th grade  Occupational History   Occupation: cook  Tobacco Use   Smoking status: Former    Years: 20.00    Types: Cigarettes, E-cigarettes    Start date: 01/19/2021    Quit date: 08/24/2012    Years  since quitting: 9.1   Smokeless tobacco: Never  Vaping Use   Vaping Use: Every day   Substances: Flavoring  Substance and Sexual Activity   Alcohol use: Yes    Comment: occasionally   Drug use: Yes    Types: Marijuana    Comment: occ.   Sexual activity: Yes    Partners: Male    Birth control/protection: Surgical    Comment: 1 partner  Other Topics Concern   Not on file  Social History Narrative   Not on file   Social Determinants of Health   Financial Resource Strain: Low Risk  (02/25/2021)   Overall Financial Resource Strain (CARDIA)    Difficulty of Paying Living Expenses: Not very hard  Food Insecurity: No Food Insecurity (02/19/2021)   Hunger Vital Sign    Worried About Running Out of Food in the Last Year: Never true    Ran Out of Food in the Last Year: Never true  Transportation Needs: No Transportation Needs (02/19/2021)   PRAPARE - Hydrologist (Medical): No    Lack of Transportation (Non-Medical): No  Physical Activity: Insufficiently Active (02/25/2021)   Exercise Vital Sign  Days of Exercise per Week: 1 day    Minutes of Exercise per Session: 30 min  Stress: No Stress Concern Present (02/25/2021)   [place]    Feeling of Stress : Only a little  Social Connections: Moderately Isolated (02/25/2021)   Social Connection and Isolation Panel [NHANES]    Frequency of Communication with Friends and Family: Twice a week    Frequency of Social Gatherings with Friends and Family: Once a week    Attends Religious Services: 1 to 4 times per year    Active Member of [employer] or Organizations: No    Attends Archivist Meetings: Never    Marital Status: Never married  Intimate Partner Violence: Not At Risk (02/25/2021)   Humiliation, Afraid, Rape, and Kick questionnaire    Fear of Current or Ex-Partner: No    Emotionally Abused: No    Physically Abused: No    Sexually  Abused: No   Family History  Problem Relation Age of Onset   Hypertension Mother    Diabetes Mother    Heart disease Mother    Hypertension Father    Diabetes Father    Prostate cancer Father    Heart disease Father    Alcohol abuse Father    Hypertension Sister    Diabetes Sister    Heart disease Maternal Grandmother        Great GM   Breast cancer Maternal Grandmother    Bone cancer Maternal Grandmother    Breast cancer Maternal Aunt    Ovarian cancer Maternal Aunt    Rheum arthritis Sister    Colon cancer Other        great Aunt   Rectal cancer Neg Hx    Stomach cancer Neg Hx    No Known Allergies  Review of Systems  Constitutional:  Negative for chills and fever.  HENT:  Positive for ear discharge and ear pain. Negative for congestion, hearing loss, sinus pain, sore throat and tinnitus.   Eyes: Negative.   Respiratory:  Negative for cough and shortness of breath.   Cardiovascular:  Negative for chest pain.  Gastrointestinal:  Negative for nausea and vomiting.  Genitourinary: Negative.   Musculoskeletal: Negative.   Skin: Negative.   Neurological:  Negative for headaches.  Endo/Heme/Allergies: Negative.   Psychiatric/Behavioral: Negative.        Objective:     BP 97/68 (BP Location: Left Arm)   Pulse 66   Resp 19   Ht '5\' 11"'$  (1.803 m)   Wt 157 lb (71.2 kg)   LMP 07/04/2017   SpO2 97%   BMI 21.90 kg/m    Physical Exam Vitals reviewed.  Constitutional:      Appearance: Normal appearance.  HENT:     Head: Normocephalic and atraumatic.     Right Ear: Drainage, swelling and tenderness present. A middle ear effusion is present.     Left Ear: Tympanic membrane, ear canal and external ear normal.     Ears:     Comments: Unable to visualize right tympanic membrane    Nose: Nose normal.     Mouth/Throat:     Mouth: Mucous membranes are moist.     Pharynx: Oropharynx is clear.  Eyes:     Extraocular Movements: Extraocular movements intact.      Conjunctiva/sclera: Conjunctivae normal.     Pupils: Pupils are equal, round, and reactive to light.  Cardiovascular:     Rate and Rhythm: Normal  rate and regular rhythm.     Pulses: Normal pulses.     Heart sounds: Normal heart sounds.  Pulmonary:     Effort: Pulmonary effort is normal.     Breath sounds: Normal breath sounds.  Musculoskeletal:        General: Normal range of motion.     Cervical back: Normal range of motion and neck supple.  Skin:    General: Skin is warm and dry.  Neurological:     General: No focal deficit present.     Mental Status: She is alert and oriented to person, place, and time.  Psychiatric:        Mood and Affect: Mood normal.        Behavior: Behavior normal.        Thought Content: Thought content normal.        Judgment: Judgment normal.        Assessment & Plan:   Problem List Items Addressed This Visit   None Visit Diagnoses     Acute swimmer's ear of right side    -  Primary   Relevant Medications   neomycin-polymyxin-hydrocortisone (CORTISPORIN) OTIC solution      1. Acute swimmer's ear of right side Trial Cortisporin.  Patient education given on supportive care, red flags given for prompt reevaluation.  Encourage patient to return to mobile unit in approximately 10 days to revisualize tympanic membrane.  Patient understands and agrees - neomycin-polymyxin-hydrocortisone (CORTISPORIN) OTIC solution; Place 4 drops into the right ear 4 (four) times daily for 10 days.  Dispense: 10 mL; Refill: 0   I have reviewed the patient's medical history (PMH, PSH, Social History, Family History, Medications, and allergies) , and have been updated if relevant. I spent 30 minutes reviewing chart and  face to face time with patient.    Return if symptoms worsen or fail to improve.    Loraine Grip Mayers, PA-C

## 2021-11-27 ENCOUNTER — Other Ambulatory Visit: Payer: Self-pay | Admitting: Family Medicine

## 2021-11-27 ENCOUNTER — Other Ambulatory Visit: Payer: Self-pay

## 2021-11-27 ENCOUNTER — Other Ambulatory Visit (HOSPITAL_COMMUNITY): Payer: Self-pay | Admitting: Psychiatry

## 2021-11-27 DIAGNOSIS — R110 Nausea: Secondary | ICD-10-CM

## 2021-11-27 DIAGNOSIS — M2550 Pain in unspecified joint: Secondary | ICD-10-CM

## 2021-11-27 DIAGNOSIS — M545 Low back pain, unspecified: Secondary | ICD-10-CM

## 2021-11-27 DIAGNOSIS — R7303 Prediabetes: Secondary | ICD-10-CM

## 2021-11-27 DIAGNOSIS — K582 Mixed irritable bowel syndrome: Secondary | ICD-10-CM

## 2021-11-27 DIAGNOSIS — E782 Mixed hyperlipidemia: Secondary | ICD-10-CM

## 2021-11-27 DIAGNOSIS — M542 Cervicalgia: Secondary | ICD-10-CM

## 2021-11-27 MED ORDER — DICLOFENAC SODIUM 50 MG PO TBEC
DELAYED_RELEASE_TABLET | ORAL | 2 refills | Status: DC
  Filled 2021-11-27: qty 60, 30d supply, fill #0
  Filled 2021-12-30: qty 60, 30d supply, fill #1

## 2021-11-27 MED ORDER — ROSUVASTATIN CALCIUM 20 MG PO TABS
ORAL_TABLET | ORAL | 2 refills | Status: DC
  Filled 2021-11-27: qty 30, 30d supply, fill #0
  Filled 2021-12-30: qty 30, 30d supply, fill #1

## 2021-11-27 MED ORDER — TIZANIDINE HCL 4 MG PO TABS
ORAL_TABLET | ORAL | 0 refills | Status: DC
  Filled 2021-11-27: qty 90, 30d supply, fill #0

## 2021-11-27 MED ORDER — ONDANSETRON 8 MG PO TBDP
8.0000 mg | ORAL_TABLET | Freq: Every day | ORAL | 0 refills | Status: DC | PRN
  Filled 2021-11-27: qty 20, 20d supply, fill #0

## 2021-11-27 MED ORDER — METFORMIN HCL 500 MG PO TABS
ORAL_TABLET | Freq: Every day | ORAL | 2 refills | Status: DC
  Filled 2021-11-27: qty 30, 30d supply, fill #0
  Filled 2021-12-30: qty 30, 30d supply, fill #1

## 2021-11-27 MED ORDER — DICYCLOMINE HCL 10 MG PO CAPS
ORAL_CAPSULE | ORAL | 1 refills | Status: DC
  Filled 2021-11-27: qty 60, 30d supply, fill #0
  Filled 2021-12-30: qty 60, 30d supply, fill #1

## 2021-11-27 NOTE — Telephone Encounter (Signed)
Pt scheduled appt so that meds could be filled, will send med request  to MD due to labs out of date. Pt states she will have insurance by the time of appt.

## 2021-11-27 NOTE — Telephone Encounter (Signed)
Requested medication (s) are due for refill today: yes  Requested medication (s) are on the active medication list: yes  Last refill:  Rosuvastatin 4/11/213 #30 with 2 RF, Diclofenac 07/19/21 #60 with 2 RF, Dicyclomine 07/28/21 #60 with 2 RF, Tizanidine 10/07/21 #90 with 0 RF, Metformin 10/07/21 #30 with 0 RF (Curtesy RF) Ondansetron 10/07/21 #20 with 0 RF  Future visit scheduled: yes, 01/26/22, last seen 06/14/21  Notes to clinic:  Ondansetron and Tizanidine not delegated, all others, labs are out of date. Pt has appt 01/26/22, please assess.     Requested Prescriptions  Pending Prescriptions Disp Refills   rosuvastatin (CRESTOR) 20 MG tablet 30 tablet 2    Sig: TAKE 1 TABLET (20 MG TOTAL) BY MOUTH DAILY. TO LOWER CHOLESTEROL     Cardiovascular:  Antilipid - Statins 2 Failed - 11/27/2021 12:53 PM      Failed - Lipid Panel in normal range within the last 12 months    Cholesterol, Total  Date Value Ref Range Status  11/24/2020 153 100 - 199 mg/dL Final   LDL Chol Calc (NIH)  Date Value Ref Range Status  11/24/2020 57 0 - 99 mg/dL Final   HDL  Date Value Ref Range Status  11/24/2020 73 >39 mg/dL Final   Triglycerides  Date Value Ref Range Status  11/24/2020 138 0 - 149 mg/dL Final         Passed - Cr in normal range and within 360 days    Creat  Date Value Ref Range Status  11/27/2015 0.75 0.50 - 1.10 mg/dL Final   Creatinine, Ser  Date Value Ref Range Status  05/25/2021 0.73 0.57 - 1.00 mg/dL Final         Passed - Patient is not pregnant      Passed - Valid encounter within last 12 months    Recent Outpatient Visits           6 months ago Pre-diabetes   [place], Charlane Ferretti, MD   1 year ago Vasomotor symptoms due to menopause   [place], Enobong, MD   1 year ago UTI symptoms   [place], Vernia Buff, NP   1 year ago Other spondylosis with radiculopathy,  cervical region   [place], Enobong, MD   1 year ago Smoking   [place], [place], [place]       Future Appointments             In 2 months Elsie Stain, MD [place]             diclofenac (VOLTAREN) 50 MG EC tablet 60 tablet 2    Sig: TAKE 1 TABLET (50 MG TOTAL) BY MOUTH 2 (TWO) TIMES DAILY. AFTER A MEAL AS NEEDED FOR PAIN     Analgesics:  NSAIDS Failed - 11/27/2021 12:53 PM      Failed - Manual Review: Labs are only required if the patient has taken medication for more than 8 weeks.      Failed - HGB in normal range and within 360 days    Hemoglobin  Date Value Ref Range Status  07/30/2020 14.2 12.0 - 15.0 g/dL Final  05/28/2020 11.8 11.1 - 15.9 g/dL Final         Failed - PLT in normal range and within 360 days  Platelets  Date Value Ref Range Status  07/30/2020 234 150 - 400 K/uL Final  05/28/2020 257 150 - 450 x10E3/uL Final         Failed - HCT in normal range and within 360 days    HCT  Date Value Ref Range Status  07/30/2020 42.4 36.0 - 46.0 % Final   Hematocrit  Date Value Ref Range Status  05/28/2020 35.3 34.0 - 46.6 % Final         Passed - Cr in normal range and within 360 days    Creat  Date Value Ref Range Status  11/27/2015 0.75 0.50 - 1.10 mg/dL Final   Creatinine, Ser  Date Value Ref Range Status  05/25/2021 0.73 0.57 - 1.00 mg/dL Final         Passed - eGFR is 30 or above and within 360 days    GFR calc Af Amer  Date Value Ref Range Status  05/28/2020 69 >59 mL/min/1.73 Final    Comment:    **In accordance with recommendations from the NKF-ASN Task force,**   [facility] is in the process of updating its eGFR calculation to the   2021 CKD-EPI creatinine equation that estimates kidney function   without a race variable.    GFR, Estimated  Date Value Ref Range Status  07/30/2020 >60 >60 mL/min Final    Comment:     (NOTE) Calculated using the CKD-EPI Creatinine Equation (2021)    GFR  Date Value Ref Range Status  03/24/2020 89.57 >60.00 mL/min Final    Comment:    Calculated using the CKD-EPI Creatinine Equation (2021)   eGFR  Date Value Ref Range Status  05/25/2021 101 >59 mL/min/1.73 Final         Passed - Patient is not pregnant      Passed - Valid encounter within last 12 months    Recent Outpatient Visits           6 months ago Pre-diabetes   [place], Charlane Ferretti, MD   1 year ago Vasomotor symptoms due to menopause   [place], Enobong, MD   1 year ago UTI symptoms   [place], Vernia Buff, NP   1 year ago Other spondylosis with radiculopathy, cervical region   [place], MD   1 year ago Smoking   [place], Dionne Bucy, [place]       Future Appointments             In 2 months Elsie Stain, MD [place]             dicyclomine (BENTYL) 10 MG capsule 60 capsule 2    Sig: TAKE 1 CAPSULE BY MOUTH 2 TIMES DAILY AS NEEDED     Gastroenterology:  Antispasmodic Agents Passed - 11/27/2021 12:53 PM      Passed - Valid encounter within last 12 months    Recent Outpatient Visits           6 months ago [place], Enobong, MD   1 year ago Vasomotor symptoms due to menopause   [place], Enobong, MD   1 year ago UTI symptoms   [place], Vernia Buff, NP   1 year ago Other  spondylosis with radiculopathy, cervical region   [place], MD   1 year ago Smoking   [place], Dionne Bucy, [place]       Future Appointments             In 2  months Elsie Stain, MD [place]             metFORMIN (GLUCOPHAGE) 500 MG tablet 30 tablet 0    Sig: TAKE 1 TABLET (500 MG TOTAL) BY MOUTH DAILY WITH BREAKFAST.     Endocrinology:  Diabetes - Biguanides Failed - 11/27/2021 12:53 PM      Failed - HBA1C is between 0 and 7.9 and within 180 days    HbA1c, POC (controlled diabetic range)  Date Value Ref Range Status  05/25/2021 5.4 0.0 - 7.0 % Final         Failed - B12 Level in normal range and within 720 days    No results found for: "VITAMINB12"       Failed - Valid encounter within last 6 months    Recent Outpatient Visits           6 months ago Pre-diabetes   [place], Charlane Ferretti, MD   1 year ago Vasomotor symptoms due to menopause   [place], Charlane Ferretti, MD   1 year ago UTI symptoms   [place], Vernia Buff, NP   1 year ago Other spondylosis with radiculopathy, cervical region   [place], Charlane Ferretti, MD   1 year ago Smoking   [place], [place], [place]       Future Appointments             In 2 months Joya Gaskins Burnett Harry, MD [place]            Failed - CBC within normal limits and completed in the last 12 months    WBC  Date Value Ref Range Status  07/30/2020 6.7 4.0 - 10.5 K/uL Final   RBC  Date Value Ref Range Status  07/30/2020 4.58 3.87 - 5.11 MIL/uL Final   Hemoglobin  Date Value Ref Range Status  07/30/2020 14.2 12.0 - 15.0 g/dL Final  05/28/2020 11.8 11.1 - 15.9 g/dL Final   HCT  Date Value Ref Range Status  07/30/2020 42.4 36.0 - 46.0 % Final   Hematocrit  Date Value Ref Range Status  05/28/2020 35.3 34.0 - 46.6 % Final   MCHC  Date Value Ref Range Status  07/30/2020 33.5 30.0 - 36.0 g/dL Final   [facility]  Date Value Ref Range Status   07/30/2020 31.0 26.0 - 34.0 pg Final   MCV  Date Value Ref Range Status  07/30/2020 92.6 80.0 - 100.0 fL Final  05/28/2020 89 79 - 97 fL Final   No results found for: "PLTCOUNTKUC", "LABPLAT", "POCPLA" RDW  Date Value Ref Range Status  07/30/2020 12.8 11.5 - 15.5 % Final  05/28/2020 12.7 11.7 - 15.4 % Final         Passed - Cr in normal range and within 360 days    Creat  Date Value Ref Range Status  11/27/2015 0.75 0.50 - 1.10 mg/dL Final   Creatinine, Ser  Date Value Ref Range Status  05/25/2021 0.73  0.57 - 1.00 mg/dL Final         Passed - eGFR in normal range and within 360 days    GFR calc Af Amer  Date Value Ref Range Status  05/28/2020 69 >59 mL/min/1.73 Final    Comment:    **In accordance with recommendations from the NKF-ASN Task force,**   [facility] is in the process of updating its eGFR calculation to the   2021 CKD-EPI creatinine equation that estimates kidney function   without a race variable.    GFR, Estimated  Date Value Ref Range Status  07/30/2020 >60 >60 mL/min Final    Comment:    (NOTE) Calculated using the CKD-EPI Creatinine Equation (2021)    GFR  Date Value Ref Range Status  03/24/2020 89.57 >60.00 mL/min Final    Comment:    Calculated using the CKD-EPI Creatinine Equation (2021)   eGFR  Date Value Ref Range Status  05/25/2021 101 >59 mL/min/1.73 Final          tiZANidine (ZANAFLEX) 4 MG tablet 90 tablet 0    Sig: START WITH 1/2 TABLET BY MOUTH EVERY 8 HOURS. MAY CAUSE DROWSINEES. INCREASE TO 1 TABLET EVERY 8 HOURS AS NEEDED FOR MUSCLE PAIN IF TOLERABLE.     Not Delegated - Cardiovascular:  Alpha-2 Agonists - tizanidine Failed - 11/27/2021 12:53 PM      Failed - This refill cannot be delegated      Failed - Valid encounter within last 6 months    Recent Outpatient Visits           6 months ago [place], Enobong, MD   1 year ago Vasomotor symptoms due to menopause    [place], Enobong, MD   1 year ago UTI symptoms   [place], Vernia Buff, NP   1 year ago Other spondylosis with radiculopathy, cervical region   [place], En[place], MD   1 year ago Smoking   [place], Levada Dy M, [place]       Future Appointments             In 2 months Elsie Stain, MD [place]             ondansetron (ZOFRAN-ODT) 8 MG disintegrating tablet 20 tablet 0    Sig: Dissolve 1 tablet (8 mg total) by mouth daily as needed for nausea or vomiting.     Not Delegated - Gastroenterology: Antiemetics - ondansetron Failed - 11/27/2021 12:53 PM      Failed - This refill cannot be delegated      Failed - AST in normal range and within 360 days    AST  Date Value Ref Range Status  11/24/2020 18 0 - 40 IU/L Final         Failed - ALT in normal range and within 360 days    ALT  Date Value Ref Range Status  11/24/2020 15 0 - 32 IU/L Final         Failed - Valid encounter within last 6 months    Recent Outpatient Visits           6 months ago [place], Enobong, MD   1 year ago Vasomotor symptoms due to menopause   [facility] Charlott Rakes, MD   1 year ago UTI symptoms   [place], Vernia Buff, NP   1 year ago Other spondylosis with radiculopathy, cervical region   [place], Enobong, MD   1 year ago Smoking   [place], Dionne Bucy, [place]       Future Appointments             In 2 months Joya Gaskins Burnett Harry, MD [place]

## 2021-11-29 MED ORDER — QUETIAPINE FUMARATE 100 MG PO TABS
100.0000 mg | ORAL_TABLET | Freq: Every day | ORAL | 0 refills | Status: DC
Start: 2021-11-29 — End: 2022-01-26
  Filled 2021-11-29 – 2022-01-18 (×2): qty 90, 90d supply, fill #0

## 2021-11-30 ENCOUNTER — Other Ambulatory Visit: Payer: Self-pay

## 2021-12-02 ENCOUNTER — Other Ambulatory Visit: Payer: Self-pay | Admitting: Pharmacist

## 2021-12-02 ENCOUNTER — Other Ambulatory Visit: Payer: Self-pay

## 2021-12-02 NOTE — Chronic Care Management (AMB) (Signed)
Patient seen by Berdie Ogren, PharmD Candidate on 12/02/2021 while they were picking up prescriptions at [place] at [facility].   Blood pressure today was : 122/84, HR 69   Patient does not have an automated home blood pressure machine.   Medication review was performed. Patient is currently not taking any medications for BP. She previously was controlled on medications, but they are no longer needed due to lifestyle modifications alone improving her BP.   The following barriers to adherence were noted:  - Denies concerns with medication access or understanding.   The following interventions were completed:  - Patient was educated on how to access home blood pressure machine  - Patient was counseled on lifestyle modifications to improve blood pressure   The patient has follow up scheduled:  PCP: 01/26/22 at 11:00 AM    Catie TJodi Mourning, PharmD, [place]  [phone]

## 2021-12-30 ENCOUNTER — Other Ambulatory Visit: Payer: Self-pay | Admitting: Family Medicine

## 2021-12-30 DIAGNOSIS — K219 Gastro-esophageal reflux disease without esophagitis: Secondary | ICD-10-CM

## 2021-12-30 DIAGNOSIS — F411 Generalized anxiety disorder: Secondary | ICD-10-CM

## 2021-12-30 DIAGNOSIS — M542 Cervicalgia: Secondary | ICD-10-CM

## 2021-12-30 DIAGNOSIS — M545 Low back pain, unspecified: Secondary | ICD-10-CM

## 2021-12-30 DIAGNOSIS — R110 Nausea: Secondary | ICD-10-CM

## 2021-12-30 MED ORDER — ONDANSETRON 8 MG PO TBDP
8.0000 mg | ORAL_TABLET | Freq: Every day | ORAL | 0 refills | Status: DC | PRN
  Filled 2021-12-30: qty 20, 20d supply, fill #0

## 2021-12-30 MED ORDER — OMEPRAZOLE 20 MG PO CPDR
40.0000 mg | DELAYED_RELEASE_CAPSULE | Freq: Every day | ORAL | 0 refills | Status: DC
  Filled 2021-12-30: qty 60, 30d supply, fill #0

## 2021-12-30 MED ORDER — FLUTICASONE PROPIONATE 50 MCG/ACT NA SUSP
2.0000 | Freq: Every day | NASAL | 0 refills | Status: DC
  Filled 2021-12-30: qty 16, 30d supply, fill #0

## 2021-12-30 MED ORDER — TIZANIDINE HCL 4 MG PO TABS
ORAL_TABLET | ORAL | 0 refills | Status: DC
  Filled 2021-12-30: qty 90, 30d supply, fill #0

## 2021-12-31 ENCOUNTER — Other Ambulatory Visit: Payer: Self-pay

## 2022-01-10 NOTE — Progress Notes (Signed)
Appointment was canceled.

## 2022-01-18 ENCOUNTER — Other Ambulatory Visit (HOSPITAL_COMMUNITY): Payer: Self-pay | Admitting: Psychiatry

## 2022-01-19 ENCOUNTER — Other Ambulatory Visit: Payer: Self-pay

## 2022-01-24 MED ORDER — GABAPENTIN 300 MG PO CAPS
300.0000 mg | ORAL_CAPSULE | Freq: Three times a day (TID) | ORAL | 0 refills | Status: DC
  Filled 2022-01-24: qty 270, 90d supply, fill #0

## 2022-01-24 NOTE — Progress Notes (Unsigned)
   Established Patient Office Visit  Subjective   Patient ID: Isabel Evans, female    DOB: 1970/08/07  Age: 51 y.o. MRN: [id number]  No chief complaint on file.   51 y.o.F pcp [facility] med refills      {History (Optional):23778}  ROS    Objective:     LMP 07/04/2017  {Vitals History (Optional):23777}  Physical Exam   No results found for any visits on 01/26/22.  {Labs (Optional):23779}  The 10-year ASCVD risk score (Arnett DK, et al., 2019) is: 1.6%    Assessment & Plan:   Problem List Items Addressed This Visit   None   No follow-ups on file.    Asencion Noble, MD

## 2022-01-25 ENCOUNTER — Other Ambulatory Visit: Payer: Self-pay

## 2022-01-26 ENCOUNTER — Encounter: Payer: Self-pay | Admitting: Critical Care Medicine

## 2022-01-26 ENCOUNTER — Other Ambulatory Visit: Payer: Self-pay

## 2022-01-26 ENCOUNTER — Ambulatory Visit: Payer: Self-pay | Attending: Critical Care Medicine | Admitting: Critical Care Medicine

## 2022-01-26 VITALS — BP 118/86 | HR 92 | Temp 98.6°F | Ht 71.0 in | Wt 158.4 lb

## 2022-01-26 DIAGNOSIS — R110 Nausea: Secondary | ICD-10-CM

## 2022-01-26 DIAGNOSIS — M2550 Pain in unspecified joint: Secondary | ICD-10-CM

## 2022-01-26 DIAGNOSIS — K219 Gastro-esophageal reflux disease without esophagitis: Secondary | ICD-10-CM

## 2022-01-26 DIAGNOSIS — K582 Mixed irritable bowel syndrome: Secondary | ICD-10-CM

## 2022-01-26 DIAGNOSIS — Z780 Asymptomatic menopausal state: Secondary | ICD-10-CM

## 2022-01-26 DIAGNOSIS — G8929 Other chronic pain: Secondary | ICD-10-CM

## 2022-01-26 DIAGNOSIS — E782 Mixed hyperlipidemia: Secondary | ICD-10-CM

## 2022-01-26 DIAGNOSIS — F331 Major depressive disorder, recurrent, moderate: Secondary | ICD-10-CM

## 2022-01-26 DIAGNOSIS — F411 Generalized anxiety disorder: Secondary | ICD-10-CM

## 2022-01-26 DIAGNOSIS — Z23 Encounter for immunization: Secondary | ICD-10-CM

## 2022-01-26 DIAGNOSIS — R21 Rash and other nonspecific skin eruption: Secondary | ICD-10-CM | POA: Insufficient documentation

## 2022-01-26 DIAGNOSIS — R7303 Prediabetes: Secondary | ICD-10-CM

## 2022-01-26 DIAGNOSIS — M542 Cervicalgia: Secondary | ICD-10-CM

## 2022-01-26 DIAGNOSIS — M545 Low back pain, unspecified: Secondary | ICD-10-CM

## 2022-01-26 DIAGNOSIS — M4722 Other spondylosis with radiculopathy, cervical region: Secondary | ICD-10-CM

## 2022-01-26 DIAGNOSIS — I10 Essential (primary) hypertension: Secondary | ICD-10-CM

## 2022-01-26 MED ORDER — DICYCLOMINE HCL 10 MG PO CAPS
10.0000 mg | ORAL_CAPSULE | Freq: Two times a day (BID) | ORAL | 1 refills | Status: DC | PRN
  Filled 2022-01-26: qty 60, 30d supply, fill #0
  Filled 2022-04-15: qty 60, 30d supply, fill #1

## 2022-01-26 MED ORDER — CLOBETASOL PROPIONATE 0.05 % EX CREA
1.0000 | TOPICAL_CREAM | Freq: Two times a day (BID) | CUTANEOUS | 1 refills | Status: DC
  Filled 2022-01-26: qty 60, 30d supply, fill #0
  Filled 2022-05-18: qty 60, 30d supply, fill #1

## 2022-01-26 MED ORDER — ESCITALOPRAM OXALATE 20 MG PO TABS
20.0000 mg | ORAL_TABLET | Freq: Every day | ORAL | 2 refills | Status: DC
  Filled 2022-01-26: qty 30, 30d supply, fill #0
  Filled 2022-03-08: qty 30, 30d supply, fill #1
  Filled 2022-04-15: qty 30, 30d supply, fill #2

## 2022-01-26 MED ORDER — DICLOFENAC SODIUM 50 MG PO TBEC
50.0000 mg | DELAYED_RELEASE_TABLET | Freq: Two times a day (BID) | ORAL | 2 refills | Status: DC
  Filled 2022-01-26: qty 60, 30d supply, fill #0
  Filled 2022-03-08: qty 60, 30d supply, fill #1
  Filled 2022-04-15: qty 60, 30d supply, fill #2

## 2022-01-26 MED ORDER — ROSUVASTATIN CALCIUM 20 MG PO TABS
20.0000 mg | ORAL_TABLET | Freq: Every day | ORAL | 2 refills | Status: DC
  Filled 2022-01-26: qty 30, 30d supply, fill #0
  Filled 2022-03-08: qty 30, 30d supply, fill #1
  Filled 2022-04-15: qty 30, 30d supply, fill #2

## 2022-01-26 MED ORDER — ONDANSETRON 8 MG PO TBDP
8.0000 mg | ORAL_TABLET | Freq: Every day | ORAL | 1 refills | Status: DC | PRN
  Filled 2022-01-26: qty 40, 40d supply, fill #0
  Filled 2022-05-18: qty 30, 30d supply, fill #0
  Filled 2022-06-15: qty 30, 30d supply, fill #1
  Filled 2022-07-29 – 2022-07-30 (×2): qty 20, 20d supply, fill #2

## 2022-01-26 MED ORDER — QUETIAPINE FUMARATE 100 MG PO TABS
100.0000 mg | ORAL_TABLET | Freq: Every day | ORAL | 1 refills | Status: DC
  Filled 2022-01-26: qty 90, 90d supply, fill #0
  Filled 2022-04-15 (×2): qty 90, 90d supply, fill #1

## 2022-01-26 MED ORDER — FLUTICASONE PROPIONATE 50 MCG/ACT NA SUSP
2.0000 | Freq: Every day | NASAL | 4 refills | Status: DC
  Filled 2022-01-26: qty 16, 30d supply, fill #0
  Filled 2022-03-08: qty 16, 30d supply, fill #1
  Filled 2022-05-18: qty 16, 30d supply, fill #2
  Filled 2022-06-15: qty 16, 30d supply, fill #3
  Filled 2022-07-29 – 2022-07-30 (×2): qty 16, 30d supply, fill #4

## 2022-01-26 MED ORDER — TIZANIDINE HCL 4 MG PO TABS
ORAL_TABLET | ORAL | 0 refills | Status: DC
  Filled 2022-01-26: qty 90, 30d supply, fill #0

## 2022-01-26 MED ORDER — ESTRADIOL 0.5 MG PO TABS
0.5000 mg | ORAL_TABLET | Freq: Every day | ORAL | 3 refills | Status: DC
  Filled 2022-01-26: qty 30, 30d supply, fill #0
  Filled 2022-03-08: qty 30, 30d supply, fill #1
  Filled 2022-04-15: qty 30, 30d supply, fill #2
  Filled 2022-05-18: qty 30, 30d supply, fill #3
  Filled 2022-06-15: qty 30, 30d supply, fill #4
  Filled 2022-07-29 – 2022-07-30 (×2): qty 30, 30d supply, fill #5
  Filled 2022-09-22: qty 30, 30d supply, fill #6
  Filled 2022-10-31: qty 30, 30d supply, fill #7
  Filled 2022-12-05 – 2022-12-06 (×2): qty 30, 30d supply, fill #8
  Filled 2023-01-10 – 2023-01-24 (×2): qty 30, 30d supply, fill #9

## 2022-01-26 MED ORDER — LIDOCAINE 5 % EX PTCH
1.0000 | MEDICATED_PATCH | CUTANEOUS | 6 refills | Status: DC
  Filled 2022-01-26 – 2022-03-08 (×2): qty 30, 30d supply, fill #0
  Filled 2022-04-15: qty 30, 30d supply, fill #1
  Filled 2022-05-18: qty 30, 30d supply, fill #2
  Filled 2022-09-22 – 2022-10-31 (×2): qty 30, 30d supply, fill #3
  Filled 2022-12-05 – 2022-12-06 (×2): qty 30, 30d supply, fill #4
  Filled 2023-01-10 – 2023-01-26 (×3): qty 30, 30d supply, fill #5

## 2022-01-26 MED ORDER — TRAMADOL HCL 50 MG PO TABS
50.0000 mg | ORAL_TABLET | Freq: Three times a day (TID) | ORAL | 2 refills | Status: DC | PRN
  Filled 2022-01-26: qty 30, 10d supply, fill #0
  Filled 2022-03-08: qty 30, 10d supply, fill #1
  Filled 2022-04-15: qty 30, 10d supply, fill #2

## 2022-01-26 MED ORDER — DIPHENHYDRAMINE HCL 25 MG PO TABS
25.0000 mg | ORAL_TABLET | Freq: Every day | ORAL | 0 refills | Status: AC
  Filled 2022-01-26 – 2022-12-06 (×3): qty 30, 30d supply, fill #0

## 2022-01-26 MED ORDER — OMEPRAZOLE 20 MG PO CPDR
40.0000 mg | DELAYED_RELEASE_CAPSULE | Freq: Every day | ORAL | 0 refills | Status: DC
  Filled 2022-01-26: qty 60, 30d supply, fill #0

## 2022-01-26 MED ORDER — METFORMIN HCL 500 MG PO TABS
ORAL_TABLET | Freq: Every day | ORAL | 2 refills | Status: DC
  Filled 2022-01-26: qty 30, 30d supply, fill #0
  Filled 2022-03-08 (×2): qty 30, 30d supply, fill #1
  Filled 2022-04-15 (×2): qty 30, 30d supply, fill #2

## 2022-01-26 MED ORDER — GABAPENTIN 300 MG PO CAPS
300.0000 mg | ORAL_CAPSULE | Freq: Three times a day (TID) | ORAL | 2 refills | Status: DC
  Filled 2022-04-15: qty 270, 90d supply, fill #0
  Filled 2022-05-18: qty 90, 30d supply, fill #0
  Filled 2022-06-15: qty 90, 30d supply, fill #1
  Filled 2022-07-29 – 2022-07-30 (×2): qty 90, 30d supply, fill #2
  Filled ????-??-??: fill #3

## 2022-01-26 NOTE — Assessment & Plan Note (Signed)
No change in medications refills given

## 2022-01-26 NOTE — Assessment & Plan Note (Signed)
Skin rash right elbow We will give Temovate cream

## 2022-01-26 NOTE — Assessment & Plan Note (Signed)
Refill GI medicines

## 2022-01-26 NOTE — Patient Instructions (Signed)
Refills on all medications sent to [facility]  Temovate cream was prescribed for your BACK OF YOUR NECK TWICE DAILY  Flu vaccine given  Return to see Dr. Margarita Rana 4 months

## 2022-01-27 ENCOUNTER — Other Ambulatory Visit: Payer: Self-pay

## 2022-02-03 ENCOUNTER — Other Ambulatory Visit: Payer: Self-pay

## 2022-03-08 ENCOUNTER — Other Ambulatory Visit: Payer: Self-pay | Admitting: Critical Care Medicine

## 2022-03-08 ENCOUNTER — Other Ambulatory Visit: Payer: Self-pay

## 2022-03-08 DIAGNOSIS — M542 Cervicalgia: Secondary | ICD-10-CM

## 2022-03-08 DIAGNOSIS — M545 Low back pain, unspecified: Secondary | ICD-10-CM

## 2022-03-08 DIAGNOSIS — K219 Gastro-esophageal reflux disease without esophagitis: Secondary | ICD-10-CM

## 2022-03-08 MED ORDER — TIZANIDINE HCL 4 MG PO TABS
ORAL_TABLET | ORAL | 2 refills | Status: DC
  Filled 2022-03-08: qty 90, 30d supply, fill #0
  Filled 2022-04-15: qty 90, 30d supply, fill #1
  Filled 2022-05-18: qty 90, 30d supply, fill #2

## 2022-03-08 MED ORDER — OMEPRAZOLE 20 MG PO CPDR
40.0000 mg | DELAYED_RELEASE_CAPSULE | Freq: Every day | ORAL | 2 refills | Status: DC
  Filled 2022-03-08: qty 60, 30d supply, fill #0
  Filled 2022-04-15: qty 60, 30d supply, fill #1
  Filled 2022-05-18: qty 60, 30d supply, fill #2

## 2022-03-09 ENCOUNTER — Other Ambulatory Visit: Payer: Self-pay

## 2022-04-15 ENCOUNTER — Other Ambulatory Visit: Payer: Self-pay

## 2022-04-16 ENCOUNTER — Other Ambulatory Visit: Payer: Self-pay

## 2022-04-22 ENCOUNTER — Other Ambulatory Visit (HOSPITAL_COMMUNITY): Payer: Self-pay

## 2022-05-11 ENCOUNTER — Other Ambulatory Visit: Payer: Self-pay

## 2022-05-11 ENCOUNTER — Other Ambulatory Visit: Payer: Self-pay | Admitting: Critical Care Medicine

## 2022-05-11 DIAGNOSIS — K582 Mixed irritable bowel syndrome: Secondary | ICD-10-CM

## 2022-05-11 DIAGNOSIS — M2550 Pain in unspecified joint: Secondary | ICD-10-CM

## 2022-05-11 DIAGNOSIS — M542 Cervicalgia: Secondary | ICD-10-CM

## 2022-05-11 DIAGNOSIS — R7303 Prediabetes: Secondary | ICD-10-CM

## 2022-05-11 DIAGNOSIS — E782 Mixed hyperlipidemia: Secondary | ICD-10-CM

## 2022-05-11 MED ORDER — ESCITALOPRAM OXALATE 20 MG PO TABS
20.0000 mg | ORAL_TABLET | Freq: Every day | ORAL | 1 refills | Status: DC
  Filled 2022-05-18: qty 30, 30d supply, fill #0
  Filled 2022-06-15: qty 30, 30d supply, fill #1
  Filled 2022-07-29 – 2022-07-30 (×2): qty 30, 30d supply, fill #2
  Filled ????-??-??: fill #3

## 2022-05-11 MED ORDER — DICYCLOMINE HCL 10 MG PO CAPS
10.0000 mg | ORAL_CAPSULE | Freq: Two times a day (BID) | ORAL | 2 refills | Status: DC | PRN
  Filled 2022-05-18: qty 60, 30d supply, fill #0
  Filled 2022-06-15: qty 60, 30d supply, fill #1
  Filled 2022-07-29 – 2022-07-30 (×2): qty 60, 30d supply, fill #2
  Filled 2022-09-22: qty 60, 30d supply, fill #3
  Filled 2022-10-31: qty 60, 30d supply, fill #4
  Filled 2022-12-05 – 2022-12-06 (×2): qty 60, 30d supply, fill #5
  Filled 2023-01-26: qty 60, 30d supply, fill #6

## 2022-05-11 NOTE — Telephone Encounter (Signed)
Requested Prescriptions  Pending Prescriptions Disp Refills   diclofenac (VOLTAREN) 50 MG EC tablet 60 tablet 2    Sig: TAKE 1 TABLET (50 MG TOTAL) BY MOUTH 2 (TWO) TIMES DAILY AFTER A MEAL AS NEEDED FOR PAIN     Analgesics:  NSAIDS Failed - 05/11/2022  1:58 PM      Failed - Manual Review: Labs are only required if the patient has taken medication for more than 8 weeks.      Failed - HGB in normal range and within 360 days    Hemoglobin  Date Value Ref Range Status  07/30/2020 14.2 12.0 - 15.0 g/dL Final  05/28/2020 11.8 11.1 - 15.9 g/dL Final         Failed - PLT in normal range and within 360 days    Platelets  Date Value Ref Range Status  07/30/2020 234 150 - 400 K/uL Final  05/28/2020 257 150 - 450 x10E3/uL Final         Failed - HCT in normal range and within 360 days    HCT  Date Value Ref Range Status  07/30/2020 42.4 36.0 - 46.0 % Final   Hematocrit  Date Value Ref Range Status  05/28/2020 35.3 34.0 - 46.6 % Final         Passed - Cr in normal range and within 360 days    Creat  Date Value Ref Range Status  11/27/2015 0.75 0.50 - 1.10 mg/dL Final   Creatinine, Ser  Date Value Ref Range Status  05/25/2021 0.73 0.57 - 1.00 mg/dL Final         Passed - eGFR is 30 or above and within 360 days    GFR calc Af Amer  Date Value Ref Range Status  05/28/2020 69 >59 mL/min/1.73 Final    Comment:    **In accordance with recommendations from the NKF-ASN Task force,**   [facility] is in the process of updating its eGFR calculation to the   2021 CKD-EPI creatinine equation that estimates kidney function   without a race variable.    GFR, Estimated  Date Value Ref Range Status  07/30/2020 >60 >60 mL/min Final    Comment:    (NOTE) Calculated using the CKD-EPI Creatinine Equation (2021)    GFR  Date Value Ref Range Status  03/24/2020 89.57 >60.00 mL/min Final    Comment:    Calculated using the CKD-EPI Creatinine Equation (2021)   eGFR  Date Value Ref Range  Status  05/25/2021 101 >59 mL/min/1.73 Final         Passed - Patient is not pregnant      Passed - Valid encounter within last 12 months    Recent Outpatient Visits           3 months ago Essential hypertension   [place] Elsie Stain, MD   11 months ago [place], MD   1 year ago Vasomotor symptoms due to menopause   [place], Enobong, MD   1 year ago UTI symptoms   [place], Vernia Buff, NP   1 year ago Other spondylosis with radiculopathy, cervical region   [facility] Charlott Rakes, MD       Future Appointments             In 3 weeks [place],  Charlane Ferretti, MD [place]             dicyclomine (BENTYL) 10 MG capsule 180 capsule 2    Sig: Take 1 capsule (10 mg total) by mouth 2 (two) times daily as needed.     Gastroenterology:  Antispasmodic Agents Passed - 05/11/2022  1:58 PM      Passed - Valid encounter within last 12 months    Recent Outpatient Visits           3 months ago Essential hypertension   [place], MD   11 months ago [place], MD   1 year ago Vasomotor symptoms due to menopause   [place], Enobong, MD   1 year ago UTI symptoms   [place] Gildardo Pounds, NP   1 year ago Other spondylosis with radiculopathy, cervical region   [place], Charlane Ferretti, MD       Future Appointments             In 3 weeks Charlott Rakes, MD [place]             escitalopram (LEXAPRO) 20 MG tablet 90 tablet 1     Sig: Take 1 tablet (20 mg total) by mouth daily.     Psychiatry:  Antidepressants - SSRI Passed - 05/11/2022  1:58 PM      Passed - Completed PHQ-2 or PHQ-9 in the last 360 days      Passed - Valid encounter within last 6 months    Recent Outpatient Visits           3 months ago Essential hypertension   [place], MD   11 months ago [place], Enobong, MD   1 year ago Vasomotor symptoms due to menopause   [place], Enobong, MD   1 year ago UTI symptoms   [place] Gildardo Pounds, NP   1 year ago Other spondylosis with radiculopathy, cervical region   [place], Enobong, MD       Future Appointments             In 3 weeks Charlott Rakes, MD [place]             metFORMIN (GLUCOPHAGE) 500 MG tablet 30 tablet 2    Sig: TAKE 1 TABLET (500 MG TOTAL) BY MOUTH DAILY WITH BREAKFAST.     Endocrinology:  Diabetes - Biguanides Failed - 05/11/2022  1:58 PM      Failed - HBA1C is between 0 and 7.9 and within 180 days    HbA1c, POC (controlled diabetic range)  Date Value Ref Range Status  05/25/2021 5.4 0.0 - 7.0 % Final         Failed - B12 Level in normal range and within 720 days    No results found for: "VITAMINB12"       Failed - CBC within normal limits and completed in the last 12 months    WBC  Date Value Ref Range Status  07/30/2020 6.7  4.0 - 10.5 K/uL Final   RBC  Date Value Ref Range Status  07/30/2020 4.58 3.87 - 5.11 MIL/uL Final   Hemoglobin  Date Value Ref Range Status  07/30/2020 14.2 12.0 - 15.0 g/dL Final  05/28/2020 11.8 11.1 - 15.9 g/dL Final   HCT  Date Value Ref Range Status  07/30/2020 42.4 36.0 - 46.0 % Final   Hematocrit  Date Value Ref Range Status  05/28/2020 35.3  34.0 - 46.6 % Final   MCHC  Date Value Ref Range Status  07/30/2020 33.5 30.0 - 36.0 g/dL Final   [facility]  Date Value Ref Range Status  07/30/2020 31.0 26.0 - 34.0 pg Final   MCV  Date Value Ref Range Status  07/30/2020 92.6 80.0 - 100.0 fL Final  05/28/2020 89 79 - 97 fL Final   No results found for: "PLTCOUNTKUC", "LABPLAT", "POCPLA" RDW  Date Value Ref Range Status  07/30/2020 12.8 11.5 - 15.5 % Final  05/28/2020 12.7 11.7 - 15.4 % Final         Passed - Cr in normal range and within 360 days    Creat  Date Value Ref Range Status  11/27/2015 0.75 0.50 - 1.10 mg/dL Final   Creatinine, Ser  Date Value Ref Range Status  05/25/2021 0.73 0.57 - 1.00 mg/dL Final         Passed - eGFR in normal range and within 360 days    GFR calc Af Amer  Date Value Ref Range Status  05/28/2020 69 >59 mL/min/1.73 Final    Comment:    **In accordance with recommendations from the NKF-ASN Task force,**   [facility] is in the process of updating its eGFR calculation to the   2021 CKD-EPI creatinine equation that estimates kidney function   without a race variable.    GFR, Estimated  Date Value Ref Range Status  07/30/2020 >60 >60 mL/min Final    Comment:    (NOTE) Calculated using the CKD-EPI Creatinine Equation (2021)    GFR  Date Value Ref Range Status  03/24/2020 89.57 >60.00 mL/min Final    Comment:    Calculated using the CKD-EPI Creatinine Equation (2021)   eGFR  Date Value Ref Range Status  05/25/2021 101 >59 mL/min/1.73 Final         Passed - Valid encounter within last 6 months    Recent Outpatient Visits           3 months ago Essential hypertension   [place], MD   11 months ago [place] Charlott Rakes, MD   1 year ago Vasomotor symptoms due to menopause   [place], Enobong, MD   1 year ago UTI symptoms    [place], Vernia Buff, NP   1 year ago Other spondylosis with radiculopathy, cervical region   [place], Enobong, MD       Future Appointments             In 3 weeks Charlott Rakes, MD [place]             rosuvastatin (CRESTOR) 20 MG tablet 30 tablet 2    Sig: TAKE 1 TABLET (20 MG TOTAL) BY MOUTH DAILY TO LOWER CHOLESTEROL     Cardiovascular:  Antilipid - Statins 2  Failed - 05/11/2022  1:58 PM      Failed - Lipid Panel in normal range within the last 12 months    Cholesterol, Total  Date Value Ref Range Status  11/24/2020 153 100 - 199 mg/dL Final   LDL Chol Calc (NIH)  Date Value Ref Range Status  11/24/2020 57 0 - 99 mg/dL Final   HDL  Date Value Ref Range Status  11/24/2020 73 >39 mg/dL Final   Triglycerides  Date Value Ref Range Status  11/24/2020 138 0 - 149 mg/dL Final         Passed - Cr in normal range and within 360 days    Creat  Date Value Ref Range Status  11/27/2015 0.75 0.50 - 1.10 mg/dL Final   Creatinine, Ser  Date Value Ref Range Status  05/25/2021 0.73 0.57 - 1.00 mg/dL Final         Passed - Patient is not pregnant      Passed - Valid encounter within last 12 months    Recent Outpatient Visits           3 months ago Essential hypertension   [place], MD   11 months ago Pre-diabetes   [place], Enobong, MD   1 year ago Vasomotor symptoms due to menopause   [place], Enobong, MD   1 year ago UTI symptoms   [place], Vernia Buff, NP   1 year ago Other spondylosis with radiculopathy, cervical region   [place], Charlane Ferretti, MD       Future Appointments             In 3 weeks Charlott Rakes, MD [place]             traMADol (ULTRAM) 50 MG tablet 30 tablet 2    Sig: Take 1 tablet (50 mg total) by mouth 3 (three) times daily as needed.     Not Delegated - Analgesics:  Opioid Agonists Failed - 05/11/2022  1:58 PM      Failed - This refill cannot be delegated      Failed - Urine Drug Screen completed in last 360 days      Failed - Valid encounter within last 3 months    Recent Outpatient Visits           3 months ago Essential hypertension   [place], MD   11 months ago [place], Enobong, MD   1 year ago Vasomotor symptoms due to menopause   [place], Enobong, MD   1 year ago UTI symptoms   [place] Gildardo Pounds, NP   1 year ago Other spondylosis with radiculopathy, cervical region   [facility] Charlott Rakes, MD       Future Appointments             In 3 weeks Charlott Rakes, MD [place]

## 2022-05-11 NOTE — Telephone Encounter (Signed)
Requested medications are due for refill today.  yes  Requested medications are on the active medications list.  yes  Last refill. All refilled 01/26/2022  Future visit scheduled.   yes  Notes to clinic.  Tramadol is not delegated. The other 3 failed protocol d/t expired labs.    Requested Prescriptions  Pending Prescriptions Disp Refills   diclofenac (VOLTAREN) 50 MG EC tablet 60 tablet 2    Sig: TAKE 1 TABLET (50 MG TOTAL) BY MOUTH 2 (TWO) TIMES DAILY AFTER A MEAL AS NEEDED FOR PAIN     Analgesics:  NSAIDS Failed - 05/11/2022  1:58 PM      Failed - Manual Review: Labs are only required if the patient has taken medication for more than 8 weeks.      Failed - HGB in normal range and within 360 days    Hemoglobin  Date Value Ref Range Status  07/30/2020 14.2 12.0 - 15.0 g/dL Final  05/28/2020 11.8 11.1 - 15.9 g/dL Final         Failed - PLT in normal range and within 360 days    Platelets  Date Value Ref Range Status  07/30/2020 234 150 - 400 K/uL Final  05/28/2020 257 150 - 450 x10E3/uL Final         Failed - HCT in normal range and within 360 days    HCT  Date Value Ref Range Status  07/30/2020 42.4 36.0 - 46.0 % Final   Hematocrit  Date Value Ref Range Status  05/28/2020 35.3 34.0 - 46.6 % Final         Passed - Cr in normal range and within 360 days    Creat  Date Value Ref Range Status  11/27/2015 0.75 0.50 - 1.10 mg/dL Final   Creatinine, Ser  Date Value Ref Range Status  05/25/2021 0.73 0.57 - 1.00 mg/dL Final         Passed - eGFR is 30 or above and within 360 days    GFR calc Af Amer  Date Value Ref Range Status  05/28/2020 69 >59 mL/min/1.73 Final    Comment:    **In accordance with recommendations from the NKF-ASN Task force,**   [facility] is in the process of updating its eGFR calculation to the   2021 CKD-EPI creatinine equation that estimates kidney function   without a race variable.    GFR, Estimated  Date Value Ref Range Status   07/30/2020 >60 >60 mL/min Final    Comment:    (NOTE) Calculated using the CKD-EPI Creatinine Equation (2021)    GFR  Date Value Ref Range Status  03/24/2020 89.57 >60.00 mL/min Final    Comment:    Calculated using the CKD-EPI Creatinine Equation (2021)   eGFR  Date Value Ref Range Status  05/25/2021 101 >59 mL/min/1.73 Final         Passed - Patient is not pregnant      Passed - Valid encounter within last 12 months    Recent Outpatient Visits           3 months ago Essential hypertension   [place] Elsie Stain, MD   11 months ago [place], Enobong, MD   1 year ago Vasomotor symptoms due to menopause   [place], Enobong, MD   1 year ago UTI symptoms   [place]  [place], [place], NP   1 year ago Other spondylosis with radiculopathy, cervical region   [place], MD       Future Appointments             In 3 weeks Charlott Rakes, MD [place]             metFORMIN (GLUCOPHAGE) 500 MG tablet 30 tablet 2    Sig: TAKE 1 TABLET (500 MG TOTAL) BY MOUTH DAILY WITH BREAKFAST.     Endocrinology:  Diabetes - Biguanides Failed - 05/11/2022  1:58 PM      Failed - HBA1C is between 0 and 7.9 and within 180 days    HbA1c, POC (controlled diabetic range)  Date Value Ref Range Status  05/25/2021 5.4 0.0 - 7.0 % Final         Failed - B12 Level in normal range and within 720 days    No results found for: "VITAMINB12"       Failed - CBC within normal limits and completed in the last 12 months    WBC  Date Value Ref Range Status  07/30/2020 6.7 4.0 - 10.5 K/uL Final   RBC  Date Value Ref Range Status  07/30/2020 4.58 3.87 - 5.11 MIL/uL Final   Hemoglobin  Date Value Ref Range Status   07/30/2020 14.2 12.0 - 15.0 g/dL Final  05/28/2020 11.8 11.1 - 15.9 g/dL Final   HCT  Date Value Ref Range Status  07/30/2020 42.4 36.0 - 46.0 % Final   Hematocrit  Date Value Ref Range Status  05/28/2020 35.3 34.0 - 46.6 % Final   MCHC  Date Value Ref Range Status  07/30/2020 33.5 30.0 - 36.0 g/dL Final   [facility]  Date Value Ref Range Status  07/30/2020 31.0 26.0 - 34.0 pg Final   MCV  Date Value Ref Range Status  07/30/2020 92.6 80.0 - 100.0 fL Final  05/28/2020 89 79 - 97 fL Final   No results found for: "PLTCOUNTKUC", "LABPLAT", "POCPLA" RDW  Date Value Ref Range Status  07/30/2020 12.8 11.5 - 15.5 % Final  05/28/2020 12.7 11.7 - 15.4 % Final         Passed - Cr in normal range and within 360 days    Creat  Date Value Ref Range Status  11/27/2015 0.75 0.50 - 1.10 mg/dL Final   Creatinine, Ser  Date Value Ref Range Status  05/25/2021 0.73 0.57 - 1.00 mg/dL Final         Passed - eGFR in normal range and within 360 days    GFR calc Af Amer  Date Value Ref Range Status  05/28/2020 69 >59 mL/min/1.73 Final    Comment:    **In accordance with recommendations from the NKF-ASN Task force,**   [facility] is in the process of updating its eGFR calculation to the   2021 CKD-EPI creatinine equation that estimates kidney function   without a race variable.    GFR, Estimated  Date Value Ref Range Status  07/30/2020 >60 >60 mL/min Final    Comment:    (NOTE) Calculated using the CKD-EPI Creatinine Equation (2021)    GFR  Date Value Ref Range Status  03/24/2020 89.57 >60.00 mL/min Final    Comment:    Calculated using the CKD-EPI Creatinine Equation (2021)   eGFR  Date Value Ref Range Status  05/25/2021 101 >59 mL/min/1.73 Final  Passed - Valid encounter within last 6 months    Recent Outpatient Visits           3 months ago Essential hypertension   [place], MD   11 months ago [place], MD   1 year ago Vasomotor symptoms due to menopause   [place], Charlane Ferretti, MD   1 year ago UTI symptoms   [place], Vernia Buff, NP   1 year ago Other spondylosis with radiculopathy, cervical region   [place], MD       Future Appointments             In 3 weeks Charlott Rakes, MD [place]             rosuvastatin (CRESTOR) 20 MG tablet 30 tablet 2    Sig: TAKE 1 TABLET (20 MG TOTAL) BY MOUTH DAILY TO LOWER CHOLESTEROL     Cardiovascular:  Antilipid - Statins 2 Failed - 05/11/2022  1:58 PM      Failed - Lipid Panel in normal range within the last 12 months    Cholesterol, Total  Date Value Ref Range Status  11/24/2020 153 100 - 199 mg/dL Final   LDL Chol Calc (NIH)  Date Value Ref Range Status  11/24/2020 57 0 - 99 mg/dL Final   HDL  Date Value Ref Range Status  11/24/2020 73 >39 mg/dL Final   Triglycerides  Date Value Ref Range Status  11/24/2020 138 0 - 149 mg/dL Final         Passed - Cr in normal range and within 360 days    Creat  Date Value Ref Range Status  11/27/2015 0.75 0.50 - 1.10 mg/dL Final   Creatinine, Ser  Date Value Ref Range Status  05/25/2021 0.73 0.57 - 1.00 mg/dL Final         Passed - Patient is not pregnant      Passed - Valid encounter within last 12 months    Recent Outpatient Visits           3 months ago Essential hypertension   [place], MD   11 months ago [place], Enobong, MD   1 year ago Vasomotor symptoms due to menopause   [place], Enobong, MD   1 year ago UTI symptoms   [place]  [place], Vernia Buff, NP   1 year ago Other spondylosis with radiculopathy, cervical region   [place], Charlane Ferretti, MD       Future Appointments             In 3 weeks Charlott Rakes, MD [place]             traMADol (ULTRAM) 50 MG tablet 30 tablet 2    Sig: Take 1 tablet (50 mg total) by mouth 3 (three) times daily as needed.     Not Delegated - Analgesics:  Opioid Agonists Failed - 05/11/2022  1:58 PM      Failed - This refill cannot be delegated  Failed - Urine Drug Screen completed in last 360 days      Failed - Valid encounter within last 3 months    Recent Outpatient Visits           3 months ago Essential hypertension   [place], MD   11 months ago [place], Enobong, MD   1 year ago Vasomotor symptoms due to menopause   [place], Enobong, MD   1 year ago UTI symptoms   [place] Gildardo Pounds, NP   1 year ago Other spondylosis with radiculopathy, cervical region   [place], Charlane Ferretti, MD       Future Appointments             In 3 weeks Charlott Rakes, MD [place]            Signed Prescriptions Disp Refills   dicyclomine (BENTYL) 10 MG capsule 180 capsule 2    Sig: Take 1 capsule (10 mg total) by mouth 2 (two) times daily as needed.     Gastroenterology:  Antispasmodic Agents Passed - 05/11/2022  1:58 PM      Passed - Valid encounter within last 12 months    Recent Outpatient Visits           3 months ago Essential hypertension   [place], MD   11 months ago [place],  MD   1 year ago Vasomotor symptoms due to menopause   [place], Enobong, MD   1 year ago UTI symptoms   [place] Gildardo Pounds, NP   1 year ago Other spondylosis with radiculopathy, cervical region   [place], Charlane Ferretti, MD       Future Appointments             In 3 weeks Charlott Rakes, MD [place]             escitalopram (LEXAPRO) 20 MG tablet 90 tablet 1    Sig: Take 1 tablet (20 mg total) by mouth daily.     Psychiatry:  Antidepressants - SSRI Passed - 05/11/2022  1:58 PM      Passed - Completed PHQ-2 or PHQ-9 in the last 360 days      Passed - Valid encounter within last 6 months    Recent Outpatient Visits           3 months ago Essential hypertension   [place] Elsie Stain, MD   11 months ago [place], Enobong, MD   1 year ago Vasomotor symptoms due to menopause   [place], Enobong, MD   1 year ago UTI symptoms   [place] Gildardo Pounds, NP   1 year ago Other spondylosis with radiculopathy, cervical region   [place], MD       Future Appointments  In 3 weeks Charlott Rakes, MD [place]

## 2022-05-12 ENCOUNTER — Other Ambulatory Visit: Payer: Self-pay

## 2022-05-12 MED ORDER — METFORMIN HCL 500 MG PO TABS
500.0000 mg | ORAL_TABLET | Freq: Every day | ORAL | 0 refills | Status: DC
  Filled 2022-05-12: qty 30, 30d supply, fill #0

## 2022-05-12 MED ORDER — ROSUVASTATIN CALCIUM 20 MG PO TABS
20.0000 mg | ORAL_TABLET | Freq: Every day | ORAL | 0 refills | Status: DC
  Filled 2022-05-12: qty 30, 30d supply, fill #0

## 2022-05-12 MED ORDER — DICLOFENAC SODIUM 50 MG PO TBEC
50.0000 mg | DELAYED_RELEASE_TABLET | Freq: Two times a day (BID) | ORAL | 0 refills | Status: DC
  Filled 2022-05-12: qty 60, 30d supply, fill #0

## 2022-05-14 ENCOUNTER — Other Ambulatory Visit: Payer: Self-pay

## 2022-05-18 ENCOUNTER — Other Ambulatory Visit: Payer: Self-pay

## 2022-05-30 ENCOUNTER — Other Ambulatory Visit: Payer: Self-pay | Admitting: Critical Care Medicine

## 2022-06-01 ENCOUNTER — Ambulatory Visit: Payer: Self-pay | Admitting: Family Medicine

## 2022-06-04 ENCOUNTER — Other Ambulatory Visit: Payer: Self-pay

## 2022-06-15 ENCOUNTER — Other Ambulatory Visit: Payer: Self-pay

## 2022-06-15 ENCOUNTER — Other Ambulatory Visit: Payer: Self-pay | Admitting: Family Medicine

## 2022-06-15 ENCOUNTER — Other Ambulatory Visit: Payer: Self-pay | Admitting: Critical Care Medicine

## 2022-06-15 DIAGNOSIS — R7303 Prediabetes: Secondary | ICD-10-CM

## 2022-06-15 DIAGNOSIS — E782 Mixed hyperlipidemia: Secondary | ICD-10-CM

## 2022-06-15 DIAGNOSIS — M545 Low back pain, unspecified: Secondary | ICD-10-CM

## 2022-06-15 DIAGNOSIS — M542 Cervicalgia: Secondary | ICD-10-CM

## 2022-06-15 DIAGNOSIS — K219 Gastro-esophageal reflux disease without esophagitis: Secondary | ICD-10-CM

## 2022-06-16 ENCOUNTER — Other Ambulatory Visit: Payer: Self-pay

## 2022-06-16 MED ORDER — ROSUVASTATIN CALCIUM 20 MG PO TABS
20.0000 mg | ORAL_TABLET | Freq: Every day | ORAL | 0 refills | Status: DC
  Filled 2022-06-16: qty 30, 30d supply, fill #0

## 2022-06-16 MED ORDER — OMEPRAZOLE 20 MG PO CPDR
40.0000 mg | DELAYED_RELEASE_CAPSULE | Freq: Every day | ORAL | 2 refills | Status: DC
  Filled 2022-06-16: qty 60, 30d supply, fill #0
  Filled 2022-07-29 – 2022-07-30 (×2): qty 60, 30d supply, fill #1
  Filled ????-??-??: fill #2

## 2022-06-16 MED ORDER — METFORMIN HCL 500 MG PO TABS
500.0000 mg | ORAL_TABLET | Freq: Every day | ORAL | 0 refills | Status: DC
  Filled 2022-06-16: qty 30, 30d supply, fill #0

## 2022-06-16 NOTE — Telephone Encounter (Signed)
Requested medication (s) are due for refill today: Yes  Requested medication (s) are on the active medication list: Yes  Last refill:  03/09/23  Future visit scheduled: Yes  Notes to clinic:  See request.    Requested Prescriptions  Pending Prescriptions Disp Refills   tiZANidine (ZANAFLEX) 4 MG tablet 90 tablet 2    Sig: START WITH 1/2 TABLET BY MOUTH EVERY 8 HOURS. MAY CAUSE DROWSINEES. INCREASE TO 1 TABLET EVERY 8 HOURS AS NEEDED FOR MUSCLE PAIN IF TOLERABLE.     Not Delegated - Cardiovascular:  Alpha-2 Agonists - tizanidine Failed - 06/15/2022  5:44 PM      Failed - This refill cannot be delegated      Passed - Valid encounter within last 6 months    Recent Outpatient Visits           4 months ago Essential hypertension   [place], MD   1 year ago [place], Enobong, MD   1 year ago Vasomotor symptoms due to menopause   [place], Enobong, MD   1 year ago UTI symptoms   [place] Gildardo Pounds, NP   1 year ago Other spondylosis with radiculopathy, cervical region   [facility] Charlott Rakes, MD       Future Appointments             In 3 weeks Gildardo Pounds, NP [place]            Signed Prescriptions Disp Refills   omeprazole (PRILOSEC) 20 MG capsule 60 capsule 2    Sig: TAKE 2 CAPSULES (40 MG TOTAL) BY MOUTH DAILY.     Gastroenterology: Proton Pump Inhibitors Passed - 06/15/2022  5:44 PM      Passed - Valid encounter within last 12 months    Recent Outpatient Visits           4 months ago Essential hypertension   [place] Elsie Stain, MD   1 year ago [place], MD   1  year ago Vasomotor symptoms due to menopause   [place], Enobong, MD   1 year ago UTI symptoms   [place] Gildardo Pounds, NP   1 year ago Other spondylosis with radiculopathy, cervical region   [place], Charlane Ferretti, MD       Future Appointments             In 3 weeks Gildardo Pounds, NP [place]             metFORMIN (GLUCOPHAGE) 500 MG tablet 30 tablet 0    Sig: Take 1 tablet (500 mg total) by mouth daily with breakfast.     Endocrinology:  Diabetes - Biguanides Failed - 06/15/2022  5:44 PM      Failed - Cr in normal range and within 360 days    Creat  Date Value Ref Range Status  11/27/2015 0.75 0.50 - 1.10 mg/dL Final   Creatinine, Ser  Date Value Ref Range Status  05/25/2021 0.73 0.57 - 1.00 mg/dL Final  Failed - HBA1C is between 0 and 7.9 and within 180 days    HbA1c, POC (controlled diabetic range)  Date Value Ref Range Status  05/25/2021 5.4 0.0 - 7.0 % Final         Failed - eGFR in normal range and within 360 days    GFR calc Af Amer  Date Value Ref Range Status  05/28/2020 69 >59 mL/min/1.73 Final    Comment:    **In accordance with recommendations from the NKF-ASN Task force,**   [facility] is in the process of updating its eGFR calculation to the   2021 CKD-EPI creatinine equation that estimates kidney function   without a race variable.    GFR, Estimated  Date Value Ref Range Status  07/30/2020 >60 >60 mL/min Final    Comment:    (NOTE) Calculated using the CKD-EPI Creatinine Equation (2021)    GFR  Date Value Ref Range Status  03/24/2020 89.57 >60.00 mL/min Final    Comment:    Calculated using the CKD-EPI Creatinine Equation (2021)   eGFR  Date Value Ref Range Status  05/25/2021 101 >59 mL/min/1.73 Final         Failed - B12 Level in normal range and within 720 days     No results found for: "VITAMINB12"       Failed - CBC within normal limits and completed in the last 12 months    WBC  Date Value Ref Range Status  07/30/2020 6.7 4.0 - 10.5 K/uL Final   RBC  Date Value Ref Range Status  07/30/2020 4.58 3.87 - 5.11 MIL/uL Final   Hemoglobin  Date Value Ref Range Status  07/30/2020 14.2 12.0 - 15.0 g/dL Final  05/28/2020 11.8 11.1 - 15.9 g/dL Final   HCT  Date Value Ref Range Status  07/30/2020 42.4 36.0 - 46.0 % Final   Hematocrit  Date Value Ref Range Status  05/28/2020 35.3 34.0 - 46.6 % Final   MCHC  Date Value Ref Range Status  07/30/2020 33.5 30.0 - 36.0 g/dL Final   [facility]  Date Value Ref Range Status  07/30/2020 31.0 26.0 - 34.0 pg Final   MCV  Date Value Ref Range Status  07/30/2020 92.6 80.0 - 100.0 fL Final  05/28/2020 89 79 - 97 fL Final   No results found for: "PLTCOUNTKUC", "LABPLAT", "POCPLA" RDW  Date Value Ref Range Status  07/30/2020 12.8 11.5 - 15.5 % Final  05/28/2020 12.7 11.7 - 15.4 % Final         Passed - Valid encounter within last 6 months    Recent Outpatient Visits           4 months ago Essential hypertension   [place] Elsie Stain, MD   1 year ago [place], Enobong, MD   1 year ago Vasomotor symptoms due to menopause   [place], Enobong, MD   1 year ago UTI symptoms   [place] Gildardo Pounds, NP   1 year ago Other spondylosis with radiculopathy, cervical region   [place], Enobong, MD       Future Appointments             In 3 weeks Gildardo Pounds, NP [place]  rosuvastatin (CRESTOR) 20 MG tablet 30 tablet 0    Sig: TAKE 1 TABLET (20 MG TOTAL) BY MOUTH DAILY TO LOWER CHOLESTEROL     Cardiovascular:   Antilipid - Statins 2 Failed - 06/15/2022  5:44 PM      Failed - Cr in normal range and within 360 days    Creat  Date Value Ref Range Status  11/27/2015 0.75 0.50 - 1.10 mg/dL Final   Creatinine, Ser  Date Value Ref Range Status  05/25/2021 0.73 0.57 - 1.00 mg/dL Final         Failed - Lipid Panel in normal range within the last 12 months    Cholesterol, Total  Date Value Ref Range Status  11/24/2020 153 100 - 199 mg/dL Final   LDL Chol Calc (NIH)  Date Value Ref Range Status  11/24/2020 57 0 - 99 mg/dL Final   HDL  Date Value Ref Range Status  11/24/2020 73 >39 mg/dL Final   Triglycerides  Date Value Ref Range Status  11/24/2020 138 0 - 149 mg/dL Final         Passed - Patient is not pregnant      Passed - Valid encounter within last 12 months    Recent Outpatient Visits           4 months ago Essential hypertension   [place] Elsie Stain, MD   1 year ago [place], Enobong, MD   1 year ago Vasomotor symptoms due to menopause   [place], Enobong, MD   1 year ago UTI symptoms   [place], Vernia Buff, NP   1 year ago Other spondylosis with radiculopathy, cervical region   [facility] Charlott Rakes, MD       Future Appointments             In 3 weeks Gildardo Pounds, NP [place]

## 2022-06-16 NOTE — Telephone Encounter (Signed)
Requested medication (s) are due for refill today: yES  Requested medication (s) are on the active medication list: yES  Last refill:  01/26/22  Future visit scheduled: Yes  Notes to clinic:  See request.    Requested Prescriptions  Pending Prescriptions Disp Refills   traMADol (ULTRAM) 50 MG tablet 30 tablet 2    Sig: Take 1 tablet (50 mg total) by mouth 3 (three) times daily as needed.     Not Delegated - Analgesics:  Opioid Agonists Failed - 06/15/2022  5:44 PM      Failed - This refill cannot be delegated      Failed - Urine Drug Screen completed in last 360 days      Failed - Valid encounter within last 3 months    Recent Outpatient Visits           4 months ago Essential hypertension   [place] Elsie Stain, MD   1 year ago [place], Enobong, MD   1 year ago Vasomotor symptoms due to menopause   [place], Enobong, MD   1 year ago UTI symptoms   [place] Gildardo Pounds, NP   1 year ago Other spondylosis with radiculopathy, cervical region   [facility] Charlott Rakes, MD       Future Appointments             In 3 weeks Gildardo Pounds, NP [place]

## 2022-06-18 ENCOUNTER — Other Ambulatory Visit: Payer: Self-pay

## 2022-06-18 ENCOUNTER — Ambulatory Visit (HOSPITAL_COMMUNITY)
Admission: EM | Admit: 2022-06-18 | Discharge: 2022-06-18 | Disposition: A | Discharge status: Home / Self Care | Payer: Self-pay | Attending: Internal Medicine | Admitting: Internal Medicine

## 2022-06-18 ENCOUNTER — Encounter (HOSPITAL_COMMUNITY): Payer: Self-pay | Admitting: *Deleted

## 2022-06-18 DIAGNOSIS — N390 Urinary tract infection, site not specified: Secondary | ICD-10-CM

## 2022-06-18 LAB — POCT URINALYSIS DIPSTICK, ED / UC
Glucose, UA: NEGATIVE mg/dL
Ketones, ur: 40 mg/dL — AB
Nitrite: NEGATIVE
Protein, ur: NEGATIVE mg/dL
Specific Gravity, Urine: 1.015 (ref 1.005–1.030)
Urobilinogen, UA: 1 mg/dL (ref 0.0–1.0)
pH: 6 (ref 5.0–8.0)

## 2022-06-18 MED ORDER — SULFAMETHOXAZOLE-TRIMETHOPRIM 800-160 MG PO TABS: 1.0000 | ORAL_TABLET | Freq: Two times a day (BID) | ORAL | 0 refills | Status: AC

## 2022-06-18 MED ORDER — TIZANIDINE HCL 4 MG PO TABS
2.0000 mg | ORAL_TABLET | Freq: Three times a day (TID) | ORAL | 0 refills | Status: DC | PRN
  Filled 2022-06-18: qty 90, 30d supply, fill #0

## 2022-06-18 NOTE — ED Triage Notes (Signed)
Pt states she has had lower back pain, fever and headache. She has taken tylenol 3 tabs 2 hours ago. She says she feels like when she was in DKA last time.

## 2022-06-18 NOTE — Discharge Instructions (Signed)
Your urine shows you likely have a urinary tract infection. I have sent your urine for culture to confirm this. We will go ahead and have you start taking antibiotics due to your symptoms.  Take antibiotic as directed.   Bactrim antibiotic twice daily for the next 5 days. If you develop diarrhea while taking this medication you may purchase an over-the-counter probiotic or eat yogurt with live active cultures.  To avoid GI upset please take this medication with food. I have sent your urine for culture to see what type of bacteria grows. We will call you if we need to change the treatment plan based on the results of your urine culture.  If you develop any new or worsening symptoms or do not improve in the next 2 to 3 days, please return.  If your symptoms are severe, please go to the emergency room.  Follow-up with your primary care provider for further evaluation and management of your symptoms as well as ongoing wellness visits.  I hope you feel better!

## 2022-06-18 NOTE — ED Provider Notes (Signed)
[place]    CSN: [id number] Arrival date & time: 06/18/22  1727      History   Chief Complaint Chief Complaint  Patient presents with   Back Pain    HPI Isabel Evans is a 52 y.o. female.   Patient presents to care for evaluation of acute low back pain, urinary frequency, dysuria, cloudy urine, and fever up to 101.0 that all started a couple of days ago.  Reports history of "kidney infections".  Denies gross hematuria, nausea, vomiting, abdominal pain, and flank pain.  No recent trauma/injury to the low back or abdomen.  Denies numbness and tingling to the bilateral lower extremities, saddle anesthesia symptoms, urinary or stool incontinence, constipation, diarrhea, and blood/mucus in the stools.  No recent viral upper respiratory tract infection symptoms.  Reports generalized headache that has responded well to as needed use of Tylenol.  Last dose of Tylenol was 2 hours prior to arrival at urgent care, patient currently reporting sweats after taking Tylenol.  States she was very cold prior to taking this.  Denies recent antibiotic or steroid use.  She drinks lots of water and denies frequent use of urinary irritants.     Back Pain   Past Medical History:  Diagnosis Date   Anemia 11/24/2017   Anxiety    Bone spur    cervical spine   Chronic kidney disease 04/2017   kidney infections   Depression    Deviated nasal septum    states is unable to lie flat, because her nose will become congested and she can stop breathing   Diabetes mellitus without complication (HCC)    Dysrhythmia    heart flutters occasionally   Fibromyalgia    Fibromyalgia    GERD (gastroesophageal reflux disease)    Hallux abductovalgus with bunions 09/2014   right    Hammertoe 09/2014   right 4th, 5th   History of stomach ulcers    Hyperlipidemia    Hypertension    states is under control with med., has been on med. x 8 mos.   IBS (irritable bowel syndrome)    no current med.    Osteoarthritis    hands, bilateral hips   Peripheral vascular disease ([place])    occasional swelling in both legs   Sinus headache    UTI (urinary tract infection) 05/21/2013    Patient Active Problem List   Diagnosis Date Noted   Skin rash 01/26/2022   Family history of cancer 04/08/2021   Menopause 04/08/2021   S/P cervical spinal fusion 09/16/2020   Cervical spinal stenosis 07/30/2020   Other spondylosis with radiculopathy, cervical region 06/18/2020   Major depressive disorder, recurrent episode, moderate ([place]) 05/26/2020   Protrusion of cervical intervertebral disc 03/22/2018   Anemia 11/24/2017   Pre-diabetes 11/24/2017   Blurry vision, bilateral 05/18/2017   Callus of foot 05/18/2017   Pain in joint of left hip 02/16/2017   Bruises easily 11/10/2016   Gastroesophageal reflux disease without esophagitis 09/22/2016   Fatigue associated with anemia 09/22/2016   Flexion contractures 09/22/2016   Generalized anxiety disorder 01/01/2016   Healthcare maintenance 05/08/2015   Stress at home 10/24/2014   Irritable bowel syndrome 10/24/2014   Anxiety state 10/24/2014   Pap smear for cervical cancer screening 09/19/2014   Midline low back pain without sciatica 08/12/2014   [place] nodes 08/12/2014   Contracture of joint, hand 08/12/2014   Essential hypertension 02/11/2014   Allergy 02/11/2014   Screening for breast cancer 02/11/2014  Dupuytren's contracture of both hands 11/15/2013   Hallux valgus of right foot 11/15/2013   Back pain 05/21/2013   Multiple skin nodules 11/27/2012   Fibromyalgia 09/08/2012   Osteoarthritis 09/08/2012   Neck muscle spasm 07/14/2012   Plantar wart 07/14/2012   Insomnia 07/14/2012    Past Surgical History:  Procedure Laterality Date   ABDOMINAL HYSTERECTOMY     ANTERIOR CERVICAL DECOMP/DISCECTOMY FUSION N/A 07/30/2020   Procedure: C5-6 ANTERIOR CERVICAL DECOMPRESSION/DISCECTOMY FUSION, ALLOGRAFT, PLATE;  Surgeon: Marybelle Killings, MD;   Location: [facility];  Service: Orthopedics;  Laterality: N/A;   BREAST BIOPSY Left 04/2018   benign   BREAST BIOPSY Left 2016   benign   BUNIONECTOMY Right 10/04/2014   Procedure:  Alt[place]e [place], Emmaline Life;  Surgeon: Trula Slade, DPM;  Location: [facility];  Service: Podiatry;  Laterality: Right;   COLONOSCOPY WITH PROPOFOL  02/13/2013   CYSTOSCOPY N/A 07/07/2017   Procedure: CYSTOSCOPY;  Surgeon: Aletha Halim, MD;  Location: [facility];  Service: Gynecology;  Laterality: N/A;   DUPUYTREN / PALMAR FASCIOTOMY Right 07/18/2013   exc. 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   small finger   HAMMER TOE SURGERY Right 10/04/2014   Procedure: HAMMER TOE REPAIR 4TH  AND 5TH  RIGHT FOOT  fourth toe fixation with k wire;  Surgeon: Trula Slade, DPM;  Location: [facility];  Service: Podiatry;  Laterality: Right;   LEG SURGERY Left    as a child; near amputation of leg   TUBAL LIGATION     VAGINAL HYSTERECTOMY N/A 07/07/2017   Procedure: HYSTERECTOMY VAGINAL;  Surgeon: Aletha Halim, MD;  Location: [facility];  Service: Gynecology;  Laterality: N/A;    OB History     Gravida  3   Para  3   Term  3   Preterm  0   AB  0   Living  3      SAB  0   IAB  0   Ectopic  0   Multiple  0   Live Births           Obstetric Comments  SVD x 3          Home Medications    Prior to Admission medications   Medication Sig Start Date End Date Taking? Authorizing Provider  clobetasol cream (TEMOVATE) 0.05 % Apply to affected areas on elbow and back of neck 2 (two) times daily. 01/26/22  Yes Elsie Stain, MD  diclofenac (VOLTAREN) 50 MG EC tablet TAKE 1 TABLET (50 MG TOTAL) BY MOUTH 2 (TWO) TIMES DAILY AFTER A MEAL AS NEEDED FOR PAIN 05/12/22  Yes Charlott Rakes, MD  dicyclomine (BENTYL) 10 MG capsule Take 1 capsule (10 mg total) by mouth 2 (two) times daily as needed. 05/11/22  Yes Charlott Rakes, MD   diphenhydrAMINE (BENADRYL) 25 MG tablet Take 1 tablet (25 mg total) by mouth daily. 01/26/22  Yes Elsie Stain, MD  escitalopram (LEXAPRO) 20 MG tablet Take 1 tablet (20 mg total) by mouth daily. 05/11/22  Yes Charlott Rakes, MD  estradiol (ESTRACE) 0.5 MG tablet Take 1 tablet (0.5 mg total) by mouth daily. 01/26/22  Yes Elsie Stain, MD  fluticasone (FLONASE) 50 MCG/ACT nasal spray PLACE 2 SPRAYS INTO BOTH NOSTRILS DAILY. 01/26/22 01/26/23 Yes Elsie Stain, MD  gabapentin (NEURONTIN) 300 MG capsule Take 1 capsule (300 mg total) by mouth 3 (three) times daily. 01/26/22 07/18/22 Yes Asencion Noble  E, MD  lidocaine (LIDODERM) 5 % Place 1 patch onto the skin daily. Remove & Discard patch within 12 hours or as directed by MD 01/26/22  Yes Elsie Stain, MD  metFORMIN (GLUCOPHAGE) 500 MG tablet Take 1 tablet (500 mg total) by mouth daily with breakfast. 06/16/22  Yes Newlin, Enobong, MD  omeprazole (PRILOSEC) 20 MG capsule TAKE 2 CAPSULES (40 MG TOTAL) BY MOUTH DAILY. 06/16/22  Yes Charlott Rakes, MD  ondansetron (ZOFRAN-ODT) 8 MG disintegrating tablet Dissolve 1 tablet (8 mg total) by mouth daily as needed for nausea or vomiting. 01/26/22 01/26/23 Yes Elsie Stain, MD  QUEtiapine (SEROQUEL) 100 MG tablet Take 1 tablet (100 mg total) by mouth at bedtime. 01/26/22 07/28/22 Yes Elsie Stain, MD  rosuvastatin (CRESTOR) 20 MG tablet TAKE 1 TABLET (20 MG TOTAL) BY MOUTH DAILY TO LOWER CHOLESTEROL 06/16/22  Yes Charlott Rakes, MD  sulfamethoxazole-trimethoprim (BACTRIM DS) 800-160 MG tablet Take 1 tablet by mouth 2 (two) times daily for 5 days. 06/18/22 06/23/22 Yes Havoc Sanluis, Stasia Cavalier, FNP  tiZANidine (ZANAFLEX) 4 MG tablet START WITH 0.5 TABLET (2 MG) BY MOUTH EVERY 8 HOURS. MAY CAUSE DROWSINEES. INCREASE TO 1 TABLET ('4MG'$ ) EVERY 8 HOURS AS NEEDED FOR MUSCLE PAIN IF TOLERABLE. 06/18/22  Yes Newlin, Enobong, MD  traMADol (ULTRAM) 50 MG tablet Take 1 tablet (50 mg total) by mouth 3 (three)  times daily as needed. 01/26/22  Yes Elsie Stain, MD  clonazePAM (KLONOPIN) 0.5 MG tablet Take 1 bid prn anxiety.  Use sparingly Patient not taking: Reported on 06/19/2020 03/24/20 06/19/20  Argentina [facility], PA-C  traZODone (DESYREL) 50 MG tablet Take 1 tablet (50 mg total) by mouth at bedtime as needed for sleep (make repeat once in an hour if not asleep). 06/09/20 07/07/20  Patrecia Pour, NP  venlafaxine XR (EFFEXOR-XR) 150 MG 24 hr capsule TAKE 1 CAPSULE BY MOUTH DAILY WITH BREAKFAST. 03/17/20 05/05/20  Antony Blackbird, MD    Family History Family History  Problem Relation Age of Onset   Hypertension Mother    Diabetes Mother    Heart disease Mother    Hypertension Father    Diabetes Father    Prostate cancer Father    Heart disease Father    Alcohol abuse Father    Hypertension Sister    Diabetes Sister    Heart disease Maternal Grandmother        Great GM   Breast cancer Maternal Grandmother    Bone cancer Maternal Grandmother    Breast cancer Maternal Aunt    Ovarian cancer Maternal Aunt    Rheum arthritis Sister    Colon cancer Other        great Aunt   Rectal cancer Neg Hx    Stomach cancer Neg Hx     Social History Social History   Tobacco Use   Smoking status: Former    Years: 20.00    Types: Cigarettes, E-cigarettes    Start date: 01/19/2021    Quit date: 08/24/2012    Years since quitting: 9.8   Smokeless tobacco: Never  Vaping Use   Vaping Use: Every day   Substances: Flavoring  Substance Use Topics   Alcohol use: Yes    Comment: occasionally   Drug use: Yes    Types: Marijuana    Comment: occ.     Allergies   Patient has no known allergies.   Review of Systems Review of Systems  Musculoskeletal:  Positive for back pain.  Per  HPI   Physical Exam Triage Vital Signs ED Triage Vitals  Enc Vitals Group     BP 06/18/22 1810 (!) 138/106     Pulse Rate 06/18/22 1810 100     Resp 06/18/22 1810 18     Temp 06/18/22 1810 99.1 F (37.3 C)      Temp Source 06/18/22 1810 Oral     SpO2 06/18/22 1810 95 %     Weight --      Height --      Head Circumference --      Peak Flow --      Pain Score 06/18/22 1808 6     Pain Loc --      Pain Edu? --      Excl. in [place]? --    No data found.  Updated Vital Signs BP (!) 138/106 (BP Location: Left Arm)   Pulse 100   Temp 99.1 F (37.3 C) (Oral)   Resp 18   LMP 07/04/2017   SpO2 95%   Visual Acuity Right Eye Distance:   Left Eye Distance:   Bilateral Distance:    Right Eye Near:   Left Eye Near:    Bilateral Near:     Physical Exam Vitals and nursing note reviewed.  Constitutional:      Appearance: She is not ill-appearing or toxic-appearing.  HENT:     Head: Normocephalic and atraumatic.     Right Ear: Hearing and external ear normal.     Left Ear: Hearing and external ear normal.     Nose: Nose normal.     Mouth/Throat:     Lips: Pink.     Mouth: Mucous membranes are moist. No injury.     Tongue: No lesions. Tongue does not deviate from midline.     Palate: No mass and lesions.     Pharynx: Oropharynx is clear. Uvula midline. No pharyngeal swelling, oropharyngeal exudate, posterior oropharyngeal erythema or uvula swelling.     Tonsils: No tonsillar exudate or tonsillar abscesses.  Eyes:     General: Lids are normal. Vision grossly intact. Gaze aligned appropriately.     Extraocular Movements: Extraocular movements intact.     Conjunctiva/sclera: Conjunctivae normal.  Cardiovascular:     Rate and Rhythm: Normal rate and regular rhythm.     Heart sounds: Normal heart sounds, S1 normal and S2 normal.  Pulmonary:     Effort: Pulmonary effort is normal. No respiratory distress.     Breath sounds: Normal breath sounds and air entry.  Abdominal:     General: Bowel sounds are normal.     Palpations: Abdomen is soft.     Tenderness: There is no abdominal tenderness. There is no right CVA tenderness, left CVA tenderness or guarding.  Musculoskeletal:     Cervical back:  Normal and neck supple.     Thoracic back: Normal.     Lumbar back: Tenderness present. No swelling, edema, deformity, signs of trauma, lacerations, spasms or bony tenderness. Normal range of motion. Negative right straight leg raise test and negative left straight leg raise test. No scoliosis.     Right lower leg: No edema.     Left lower leg: No edema.     Comments: TTP to the bilateral lumbar paraspinals.  Strength and sensation intact to bilateral lower extremities.  Lymphadenopathy:     Cervical: No cervical adenopathy.  Skin:    General: Skin is warm and dry.     Capillary Refill: Capillary refill takes  less than 2 seconds.     Findings: No rash.  Neurological:     General: No focal deficit present.     Mental Status: She is alert and oriented to person, place, and time. Mental status is at baseline.     Cranial Nerves: No dysarthria or facial asymmetry.  Psychiatric:        Mood and Affect: Mood normal.        Speech: Speech normal.        Behavior: Behavior normal.        Thought Content: Thought content normal.        Judgment: Judgment normal.      UC Treatments / Results  Labs (all labs ordered are listed, but only abnormal results are displayed) Labs Reviewed  POCT URINALYSIS DIPSTICK, ED / UC - Abnormal; Notable for the following components:      Result Value   Bilirubin Urine SMALL (*)    Ketones, ur 40 (*)    Hgb urine dipstick MODERATE (*)    Leukocytes,Ua LARGE (*)    All other components within normal limits  URINE CULTURE    EKG   Radiology No results found.  Procedures Procedures (including critical care time)  Medications Ordered in UC Medications - No data to display  Initial Impression / Assessment and Plan / UC Course  I have reviewed the triage vital signs and the nursing notes.  Pertinent labs & imaging results that were available during my care of the patient were reviewed by me and considered in my medical decision making (see chart  for details).   1.  Acute cystitis with hematuria Urinalysis shows moderate hemoglobin, large leukocytes, small bilirubin, and 40 ketones indicating likely urinary tract infection.  Patient has a history of pyelonephritis and is currently afebrile, however has had recent fever associated with this illness.  Due to history of pyelonephritis and concern for possible developing pyelonephritis, I would like to provide Bactrim twice daily for the next 5 days.  If symptoms fail to improve in the next 24 to 48 hours, I have advised patient to return to urgent care or go to the ER for severe symptoms.  Currently afebrile and nontoxic in appearance with hemodynamically stable vital signs.  She is borderline tachycardic without heart palpitations, shortness of breath, or chest pain reported. She is a diabetic, no glucose to urine. Does not take SGLT-2. She is agreeable with plan. Last CMP shows stable kidney function.  Discussed physical exam and available lab work findings in clinic with patient.  Counseled patient regarding appropriate use of medications and potential side effects for all medications recommended or prescribed today. Discussed red flag signs and symptoms of worsening condition,when to call the PCP office, return to urgent care, and when to seek higher level of care in the emergency department. Patient verbalizes understanding and agreement with plan. All questions answered. Patient discharged in stable condition.    Final Clinical Impressions(s) / UC Diagnoses   Final diagnoses:  Complicated UTI (urinary tract infection)     Discharge Instructions      Your urine shows you likely have a urinary tract infection. I have sent your urine for culture to confirm this. We will go ahead and have you start taking antibiotics due to your symptoms.  Take antibiotic as directed.   Bactrim antibiotic twice daily for the next 5 days. If you develop diarrhea while taking this medication you may  purchase an over-the-counter probiotic or eat yogurt with  live active cultures.  To avoid GI upset please take this medication with food. I have sent your urine for culture to see what type of bacteria grows. We will call you if we need to change the treatment plan based on the results of your urine culture.  If you develop any new or worsening symptoms or do not improve in the next 2 to 3 days, please return.  If your symptoms are severe, please go to the emergency room.  Follow-up with your primary care provider for further evaluation and management of your symptoms as well as ongoing wellness visits.  I hope you feel better!      ED Prescriptions     Medication Sig Dispense Auth. Provider   sulfamethoxazole-trimethoprim (BACTRIM DS) 800-160 MG tablet Take 1 tablet by mouth 2 (two) times daily for 5 days. 10 tablet Talbot Grumbling, FNP      PDMP not reviewed this encounter.   Talbot Grumbling, [place] 06/18/22 1939

## 2022-06-21 ENCOUNTER — Other Ambulatory Visit: Payer: Self-pay

## 2022-06-22 ENCOUNTER — Other Ambulatory Visit: Payer: Self-pay

## 2022-06-23 ENCOUNTER — Other Ambulatory Visit: Payer: Self-pay

## 2022-07-12 ENCOUNTER — Telehealth: Payer: Self-pay | Admitting: Family Medicine

## 2022-07-12 NOTE — Telephone Encounter (Signed)
FYI

## 2022-07-12 NOTE — Telephone Encounter (Signed)
Call placed to patient unable to reach message left on VM.   

## 2022-07-12 NOTE — Telephone Encounter (Signed)
Pt is calling back to speak with Cassandra. Please have Cassandra call pt back. Pt states that she does not have a good signal and to please leave a voice mail if she does not answer the phone.

## 2022-07-12 NOTE — Telephone Encounter (Signed)
Advised patient that we would refill medication to get her to her appointment if needed.
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
## 2022-07-13 ENCOUNTER — Ambulatory Visit: Payer: Self-pay | Admitting: Nurse Practitioner

## 2022-07-29 ENCOUNTER — Other Ambulatory Visit: Payer: Self-pay | Admitting: Family Medicine

## 2022-07-29 ENCOUNTER — Other Ambulatory Visit: Payer: Self-pay | Admitting: Critical Care Medicine

## 2022-07-29 DIAGNOSIS — R7303 Prediabetes: Secondary | ICD-10-CM

## 2022-07-29 DIAGNOSIS — E782 Mixed hyperlipidemia: Secondary | ICD-10-CM

## 2022-07-29 DIAGNOSIS — M545 Low back pain, unspecified: Secondary | ICD-10-CM

## 2022-07-29 DIAGNOSIS — M542 Cervicalgia: Secondary | ICD-10-CM

## 2022-07-29 DIAGNOSIS — M2550 Pain in unspecified joint: Secondary | ICD-10-CM

## 2022-07-30 ENCOUNTER — Other Ambulatory Visit: Payer: Self-pay

## 2022-07-30 MED ORDER — DICLOFENAC SODIUM 50 MG PO TBEC
50.0000 mg | DELAYED_RELEASE_TABLET | Freq: Two times a day (BID) | ORAL | 0 refills | Status: DC
  Filled 2022-07-30: qty 60, 30d supply, fill #0

## 2022-07-30 MED ORDER — ROSUVASTATIN CALCIUM 20 MG PO TABS
20.0000 mg | ORAL_TABLET | Freq: Every day | ORAL | 0 refills | Status: DC
Start: 2022-07-30 — End: 2022-09-22
  Filled 2022-07-30: qty 30, 30d supply, fill #0

## 2022-07-30 MED ORDER — TIZANIDINE HCL 4 MG PO TABS
2.0000 mg | ORAL_TABLET | Freq: Three times a day (TID) | ORAL | 0 refills | Status: DC | PRN
Start: 2022-07-30 — End: 2022-09-22
  Filled 2022-07-30: qty 90, 30d supply, fill #0

## 2022-07-30 MED ORDER — METFORMIN HCL 500 MG PO TABS
500.0000 mg | ORAL_TABLET | Freq: Every day | ORAL | 0 refills | Status: DC
Start: 2022-07-30 — End: 2022-09-22
  Filled 2022-07-30: qty 30, 30d supply, fill #0

## 2022-08-04 ENCOUNTER — Other Ambulatory Visit: Payer: Self-pay

## 2022-08-09 ENCOUNTER — Other Ambulatory Visit: Payer: Self-pay

## 2022-08-18 ENCOUNTER — Other Ambulatory Visit: Payer: Self-pay

## 2022-08-23 ENCOUNTER — Ambulatory Visit: Payer: Self-pay | Admitting: Nurse Practitioner

## 2022-09-16 ENCOUNTER — Ambulatory Visit: Payer: Self-pay | Admitting: Physician Assistant

## 2022-09-19 ENCOUNTER — Other Ambulatory Visit: Payer: Self-pay | Admitting: Critical Care Medicine

## 2022-09-19 ENCOUNTER — Other Ambulatory Visit: Payer: Self-pay | Admitting: Family Medicine

## 2022-09-19 DIAGNOSIS — E782 Mixed hyperlipidemia: Secondary | ICD-10-CM

## 2022-09-19 DIAGNOSIS — R7303 Prediabetes: Secondary | ICD-10-CM

## 2022-09-19 DIAGNOSIS — M545 Low back pain, unspecified: Secondary | ICD-10-CM

## 2022-09-19 DIAGNOSIS — F411 Generalized anxiety disorder: Secondary | ICD-10-CM

## 2022-09-19 DIAGNOSIS — M2550 Pain in unspecified joint: Secondary | ICD-10-CM

## 2022-09-19 DIAGNOSIS — M542 Cervicalgia: Secondary | ICD-10-CM

## 2022-09-22 ENCOUNTER — Encounter: Payer: Self-pay | Admitting: Pharmacist

## 2022-09-22 ENCOUNTER — Ambulatory Visit: Payer: Self-pay | Attending: Family Medicine | Admitting: Family Medicine

## 2022-09-22 ENCOUNTER — Other Ambulatory Visit (HOSPITAL_COMMUNITY): Payer: Self-pay

## 2022-09-22 ENCOUNTER — Other Ambulatory Visit: Payer: Self-pay | Admitting: Critical Care Medicine

## 2022-09-22 ENCOUNTER — Other Ambulatory Visit: Payer: Self-pay | Admitting: Family Medicine

## 2022-09-22 ENCOUNTER — Encounter: Payer: Self-pay | Admitting: Family Medicine

## 2022-09-22 ENCOUNTER — Other Ambulatory Visit: Payer: Self-pay

## 2022-09-22 VITALS — BP 130/87 | HR 99 | Ht 71.0 in | Wt 152.8 lb

## 2022-09-22 DIAGNOSIS — M545 Low back pain, unspecified: Secondary | ICD-10-CM

## 2022-09-22 DIAGNOSIS — K219 Gastro-esophageal reflux disease without esophagitis: Secondary | ICD-10-CM

## 2022-09-22 DIAGNOSIS — R110 Nausea: Secondary | ICD-10-CM

## 2022-09-22 DIAGNOSIS — R7303 Prediabetes: Secondary | ICD-10-CM

## 2022-09-22 DIAGNOSIS — Z780 Asymptomatic menopausal state: Secondary | ICD-10-CM

## 2022-09-22 DIAGNOSIS — G4709 Other insomnia: Secondary | ICD-10-CM

## 2022-09-22 DIAGNOSIS — F411 Generalized anxiety disorder: Secondary | ICD-10-CM

## 2022-09-22 DIAGNOSIS — M720 Palmar fascial fibromatosis [Dupuytren]: Secondary | ICD-10-CM

## 2022-09-22 DIAGNOSIS — M2550 Pain in unspecified joint: Secondary | ICD-10-CM

## 2022-09-22 DIAGNOSIS — M542 Cervicalgia: Secondary | ICD-10-CM

## 2022-09-22 DIAGNOSIS — E782 Mixed hyperlipidemia: Secondary | ICD-10-CM

## 2022-09-22 DIAGNOSIS — F331 Major depressive disorder, recurrent, moderate: Secondary | ICD-10-CM

## 2022-09-22 MED ORDER — TRAMADOL HCL 50 MG PO TABS
50.0000 mg | ORAL_TABLET | Freq: Every evening | ORAL | 3 refills | Status: DC | PRN
Start: 2022-09-22 — End: 2023-04-26
  Filled 2022-09-22: qty 30, 30d supply, fill #0
  Filled 2022-10-31: qty 30, 30d supply, fill #1
  Filled 2022-12-05 – 2022-12-06 (×2): qty 30, 30d supply, fill #2
  Filled 2023-01-26 – 2023-02-14 (×3): qty 30, 30d supply, fill #3

## 2022-09-22 MED ORDER — QUETIAPINE FUMARATE 100 MG PO TABS
100.0000 mg | ORAL_TABLET | Freq: Every day | ORAL | 1 refills | Status: DC
Start: 2022-09-22 — End: 2023-02-24
  Filled 2022-09-22: qty 30, 30d supply, fill #0
  Filled 2022-10-31: qty 30, 30d supply, fill #1
  Filled 2022-12-05 – 2022-12-06 (×2): qty 30, 30d supply, fill #2
  Filled 2023-01-10 – 2023-01-24 (×3): qty 30, 30d supply, fill #3

## 2022-09-22 MED ORDER — ONDANSETRON 8 MG PO TBDP
8.0000 mg | ORAL_TABLET | Freq: Every day | ORAL | 1 refills | Status: DC | PRN
Start: 2022-09-22 — End: 2023-01-10
  Filled 2022-09-22: qty 30, 30d supply, fill #0
  Filled 2022-10-31: qty 30, 30d supply, fill #1
  Filled 2022-12-05 – 2022-12-06 (×2): qty 20, 20d supply, fill #2

## 2022-09-22 MED ORDER — ROSUVASTATIN CALCIUM 20 MG PO TABS
20.0000 mg | ORAL_TABLET | Freq: Every day | ORAL | 1 refills | Status: DC
Start: 2022-09-22 — End: 2023-02-24
  Filled 2022-09-22: qty 30, 30d supply, fill #0
  Filled 2022-10-31: qty 30, 30d supply, fill #1
  Filled 2022-12-05 – 2022-12-06 (×2): qty 30, 30d supply, fill #2
  Filled 2023-01-10 – 2023-01-24 (×3): qty 30, 30d supply, fill #3

## 2022-09-22 MED ORDER — TIZANIDINE HCL 4 MG PO TABS
4.0000 mg | ORAL_TABLET | Freq: Three times a day (TID) | ORAL | 1 refills | Status: DC | PRN
Start: 2022-09-22 — End: 2022-12-05
  Filled 2022-09-22: qty 90, 30d supply, fill #0
  Filled 2022-10-31: qty 90, 30d supply, fill #1

## 2022-09-22 MED ORDER — ESCITALOPRAM OXALATE 20 MG PO TABS
20.0000 mg | ORAL_TABLET | Freq: Every day | ORAL | 1 refills | Status: DC
Start: 2022-09-22 — End: 2023-02-24
  Filled 2022-09-22: qty 30, 30d supply, fill #0
  Filled 2022-10-31: qty 30, 30d supply, fill #1
  Filled 2022-12-05 – 2022-12-06 (×2): qty 30, 30d supply, fill #2
  Filled 2023-01-10 – 2023-01-24 (×3): qty 30, 30d supply, fill #3

## 2022-09-22 MED ORDER — OMEPRAZOLE 40 MG PO CPDR
40.0000 mg | DELAYED_RELEASE_CAPSULE | Freq: Every day | ORAL | 1 refills | Status: DC
Start: 2022-09-22 — End: 2023-02-24
  Filled 2022-09-22: qty 30, 30d supply, fill #0
  Filled 2022-10-31: qty 30, 30d supply, fill #1
  Filled 2022-12-05 – 2022-12-06 (×2): qty 30, 30d supply, fill #2
  Filled 2023-01-10 – 2023-01-24 (×3): qty 30, 30d supply, fill #3

## 2022-09-22 MED ORDER — FLUTICASONE PROPIONATE 50 MCG/ACT NA SUSP
2.0000 | Freq: Every day | NASAL | 4 refills | Status: DC
Start: 2022-09-22 — End: 2023-04-26
  Filled 2022-09-22: qty 16, 30d supply, fill #0
  Filled 2022-10-31: qty 16, 30d supply, fill #1
  Filled 2022-12-05 – 2022-12-06 (×2): qty 16, 30d supply, fill #2
  Filled 2023-01-10 – 2023-01-24 (×3): qty 16, 30d supply, fill #3
  Filled 2023-02-25 – 2023-04-06 (×3): qty 16, 30d supply, fill #4

## 2022-09-22 MED ORDER — GABAPENTIN 300 MG PO CAPS
300.0000 mg | ORAL_CAPSULE | Freq: Three times a day (TID) | ORAL | 1 refills | Status: DC
Start: 2022-09-22 — End: 2023-02-24
  Filled 2022-09-22: qty 90, 30d supply, fill #0
  Filled 2022-10-31: qty 90, 30d supply, fill #1
  Filled 2022-12-05 – 2022-12-06 (×2): qty 90, 30d supply, fill #2
  Filled 2023-01-10 – 2023-01-24 (×3): qty 90, 30d supply, fill #3

## 2022-09-22 MED ORDER — DICLOFENAC SODIUM 50 MG PO TBEC
50.0000 mg | DELAYED_RELEASE_TABLET | Freq: Two times a day (BID) | ORAL | 0 refills | Status: DC
Start: 2022-09-22 — End: 2022-12-05
  Filled 2022-09-22: qty 60, 30d supply, fill #0

## 2022-09-22 MED ORDER — METFORMIN HCL 500 MG PO TABS
500.0000 mg | ORAL_TABLET | Freq: Every day | ORAL | 1 refills | Status: DC
Start: 2022-09-22 — End: 2023-02-24
  Filled 2022-09-22: qty 30, 30d supply, fill #0
  Filled 2022-10-31: qty 30, 30d supply, fill #1
  Filled 2022-12-05 – 2022-12-06 (×2): qty 30, 30d supply, fill #2
  Filled 2023-01-10 – 2023-01-24 (×3): qty 30, 30d supply, fill #3

## 2022-09-22 NOTE — Progress Notes (Signed)
Subjective:  Patient ID: Isabel Evans, female    DOB: 18-Apr-1971  Age: 52 y.o. MRN: [id number]  CC: Hypertension   HPI Arda Yoh is a 52 y.o. year old female with a history of Anxiety and Depression, spondylosis with cervical radiculopathy (status post cervical spine fusion), Dupuytren's contracture, chronic low back pain.   Interval History:  She has had a hysterectomy and has been on Estrace for hot flashes from her GYN. She would like to have labs to check her hormone levels as well.  She has sharp pains which occur intermittently in her left leg and when it occurs it is present the whole day. She has chronic pain in her neck and her lower back and has been on tramadol. Today she would like a referral to a hand surgeon for evaluation of her Dupuytren's contracture for which she has had previous hand surgeries but now her bilateral fifth fingers are flexed. Anxiety and depression are stable. Past Medical History:  Diagnosis Date   Anemia 11/24/2017   Anxiety    Bone spur    cervical spine   Chronic kidney disease 04/2017   kidney infections   Depression    Deviated nasal septum    states is unable to lie flat, because her nose will become congested and she can stop breathing   Diabetes mellitus without complication (HCC)    Dysrhythmia    heart flutters occasionally   Fibromyalgia    Fibromyalgia    GERD (gastroesophageal reflux disease)    Hallux abductovalgus with bunions 09/2014   right    Hammertoe 09/2014   right 4th, 5th   History of stomach ulcers    Hyperlipidemia    Hypertension    states is under control with med., has been on med. x 8 mos.   IBS (irritable bowel syndrome)    no current med.   Osteoarthritis    hands, bilateral hips   Peripheral vascular disease (HCC)    occasional swelling in both legs   Sinus headache    UTI (urinary tract infection) 05/21/2013    Past Surgical History:  Procedure Laterality Date   ABDOMINAL HYSTERECTOMY      ANTERIOR CERVICAL DECOMP/DISCECTOMY FUSION N/A 07/30/2020   Procedure: C5-6 ANTERIOR CERVICAL DECOMPRESSION/DISCECTOMY FUSION, ALLOGRAFT, PLATE;  Surgeon: Eldred Manges, MD;  Location: [facility];  Service: Orthopedics;  Laterality: N/A;   BREAST BIOPSY Left 04/2018   benign   BREAST BIOPSY Left 2016   benign   BUNIONECTOMY Right 10/04/2014   Procedure:  Serafina Royals, Donzetta Sprung;  Surgeon: Vivi Barrack, DPM;  Location: [facility];  Service: Podiatry;  Laterality: Right;   COLONOSCOPY WITH PROPOFOL  02/13/2013   CYSTOSCOPY N/A 07/07/2017   Procedure: CYSTOSCOPY;  Surgeon: [place] Bing, MD;  Location: [facility];  Service: Gynecology;  Laterality: N/A;   DUPUYTREN / PALMAR FASCIOTOMY Right 07/18/2013   exc. 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   small finger   HAMMER TOE SURGERY Right 10/04/2014   Procedure: HAMMER TOE REPAIR 4TH  AND 5TH  RIGHT FOOT  fourth toe fixation with k wire;  Surgeon: Vivi Barrack, DPM;  Location: [facility];  Service: Podiatry;  Laterality: Right;   LEG SURGERY Left    as a child; near amputation of leg   TUBAL LIGATION     VAGINAL HYSTERECTOMY N/A 07/07/2017   Procedure: HYSTERECTOMY VAGINAL;  Surgeon: [place] Bing, MD;  Location: [facility];  Service: Gynecology;  Laterality: N/A;    Family History  Problem Relation Age of Onset   Hypertension Mother    Diabetes Mother    Heart disease Mother    Hypertension Father    Diabetes Father    Prostate cancer Father    Heart disease Father    Alcohol abuse Father    Hypertension Sister    Diabetes Sister    Heart disease Maternal Grandmother        Great GM   Breast cancer Maternal Grandmother    Bone cancer Maternal Grandmother    Breast cancer Maternal Aunt    Ovarian cancer Maternal Aunt    Rheum arthritis Sister    Colon cancer Other        great Aunt   Rectal cancer Neg Hx    Stomach cancer Neg Hx     Social History    Socioeconomic History   Marital status: Single    Spouse name: Not on file   Number of children: 3   Years of education: Not on file   Highest education level: 7th grade  Occupational History   Occupation: cook  Tobacco Use   Smoking status: Former    Years: 20    Types: Cigarettes, E-cigarettes    Start date: 01/19/2021    Quit date: 08/24/2012    Years since quitting: 10.0   Smokeless tobacco: Never  Vaping Use   Vaping Use: Every day   Substances: Flavoring  Substance and Sexual Activity   Alcohol use: Yes    Comment: occasionally   Drug use: Yes    Types: Marijuana    Comment: occ.   Sexual activity: Yes    Partners: Male    Birth control/protection: Surgical    Comment: 1 partner  Other Topics Concern   Not on file  Social History Narrative   Not on file   Social Determinants of Health   Financial Resource Strain: Low Risk  (02/25/2021)   Overall Financial Resource Strain (CARDIA)    Difficulty of Paying Living Expenses: Not very hard  Food Insecurity: No Food Insecurity (02/19/2021)   Hunger Vital Sign    Worried About Running Out of Food in the Last Year: Never true    Ran Out of Food in the Last Year: Never true  Transportation Needs: No Transportation Needs (02/19/2021)   PRAPARE - Administrator, Civil Service (Medical): No    Lack of Transportation (Non-Medical): No  Physical Activity: Insufficiently Active (02/25/2021)   Exercise Vital Sign    Days of Exercise per Week: 1 day    Minutes of Exercise per Session: 30 min  Stress: No Stress Concern Present (02/25/2021)   [employer] of Occupational Health - Occupational Stress Questionnaire    Feeling of Stress : Only a little  Social Connections: Moderately Isolated (02/25/2021)   Social Connection and Isolation Panel [NHANES]    Frequency of Communication with Friends and Family: Twice a week    Frequency of Social Gatherings with Friends and Family: Once a week    Attends Religious  Services: 1 to 4 times per year    Active Member of [employer] or Organizations: No    Attends Banker Meetings: Never    Marital Status: Never married    No Known Allergies  Outpatient Medications Prior to Visit  Medication Sig Dispense Refill   clobetasol cream (TEMOVATE) 0.05 % Apply to affected areas on elbow and back of neck 2 (two)  times daily. 60 g 1   diclofenac (VOLTAREN) 50 MG EC tablet TAKE 1 TABLET (50 MG TOTAL) BY MOUTH 2 (TWO) TIMES DAILY AFTER A MEAL AS NEEDED FOR PAIN 60 tablet 0   dicyclomine (BENTYL) 10 MG capsule Take 1 capsule (10 mg total) by mouth 2 (two) times daily as needed. 180 capsule 2   diphenhydrAMINE (BENADRYL) 25 MG tablet Take 1 tablet (25 mg total) by mouth daily. 30 tablet 0   estradiol (ESTRACE) 0.5 MG tablet Take 1 tablet (0.5 mg total) by mouth daily. 90 tablet 3   fluticasone (FLONASE) 50 MCG/ACT nasal spray PLACE 2 SPRAYS INTO BOTH NOSTRILS DAILY. 16 g 4   lidocaine (LIDODERM) 5 % Place 1 patch onto the skin daily. Remove & Discard patch within 12 hours or as directed by MD 30 patch 6   escitalopram (LEXAPRO) 20 MG tablet Take 1 tablet (20 mg total) by mouth daily. 90 tablet 1   metFORMIN (GLUCOPHAGE) 500 MG tablet Take 1 tablet (500 mg total) by mouth daily with breakfast. 30 tablet 0   omeprazole (PRILOSEC) 20 MG capsule TAKE 2 CAPSULES (40 MG TOTAL) BY MOUTH DAILY. 60 capsule 2   ondansetron (ZOFRAN-ODT) 8 MG disintegrating tablet Dissolve 1 tablet (8 mg total) by mouth daily as needed for nausea or vomiting. 40 tablet 1   rosuvastatin (CRESTOR) 20 MG tablet TAKE 1 TABLET (20 MG TOTAL) BY MOUTH DAILY TO LOWER CHOLESTEROL 30 tablet 0   tiZANidine (ZANAFLEX) 4 MG tablet START WITH 0.5 TABLET (2 MG) BY MOUTH EVERY 8 HOURS. MAY CAUSE DROWSINEES. INCREASE TO 1 TABLET (4MG ) EVERY 8 HOURS AS NEEDED FOR MUSCLE PAIN IF TOLERABLE. 90 tablet 0   traMADol (ULTRAM) 50 MG tablet Take 1 tablet (50 mg total) by mouth 3 (three) times daily as needed. 30 tablet  2   gabapentin (NEURONTIN) 300 MG capsule Take 1 capsule (300 mg total) by mouth 3 (three) times daily. 270 capsule 2   QUEtiapine (SEROQUEL) 100 MG tablet Take 1 tablet (100 mg total) by mouth at bedtime. 90 tablet 1   No facility-administered medications prior to visit.     ROS Review of Systems  Constitutional:  Negative for activity change and appetite change.  HENT:  Negative for sinus pressure and sore throat.   Respiratory:  Negative for chest tightness, shortness of breath and wheezing.   Cardiovascular:  Negative for chest pain and palpitations.  Gastrointestinal:  Negative for abdominal distention, abdominal pain and constipation.  Genitourinary: Negative.   Musculoskeletal:        See HPI  Psychiatric/Behavioral:  Negative for behavioral problems and dysphoric mood.     Objective:  BP 130/87   Pulse 99   Ht 5\' 11"  (1.803 m)   Wt 152 lb 12.8 oz (69.3 kg)   LMP 07/04/2017   SpO2 100%   BMI 21.31 kg/m      09/22/2022   10:41 AM 06/18/2022    6:10 PM 01/26/2022   11:20 AM  BP/Weight  Systolic BP 130 138 118  Diastolic BP 87 106 86  Wt. (Lbs) 152.8  158.4  BMI 21.31 kg/m2  22.09 kg/m2      Physical Exam Constitutional:      Appearance: She is well-developed.  Cardiovascular:     Rate and Rhythm: Normal rate.     Heart sounds: Normal heart sounds. No murmur heard. Pulmonary:     Effort: Pulmonary effort is normal.     Breath sounds: Normal breath sounds. No  wheezing or rales.  Chest:     Chest wall: No tenderness.  Abdominal:     General: Bowel sounds are normal. There is no distension.     Palpations: Abdomen is soft. There is no mass.     Tenderness: There is no abdominal tenderness.  Musculoskeletal:     Right lower leg: No edema.     Left lower leg: No edema.     Comments: Flexion deformity of bilateral fifth fingers. 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  Psychiatric:        Mood and Affect: Mood normal.        Latest Ref Rng & Units 05/25/2021    9:26 AM 11/24/2020    9:15 AM 07/30/2020   10:07 AM  CMP  Glucose 70 - 99 mg/dL 161  95  92   BUN 6 - 24 mg/dL 9  12  8    Creatinine 0.57 - 1.00 mg/dL 0.96  0.45  4.09   Sodium 134 - 144 mmol/L 140  140  139   Potassium 3.5 - 5.2 mmol/L 4.4  4.0  4.6   Chloride 96 - 106 mmol/L 101  102  105   CO2 20 - 29 mmol/L 26  25  26    Calcium 8.7 - 10.2 mg/dL 9.8  9.5  9.3   Total Protein 6.0 - 8.5 g/dL  7.0    Total Bilirubin 0.0 - 1.2 mg/dL  0.4    Alkaline Phos 44 - 121 IU/L  63    AST 0 - 40 IU/L  18    ALT 0 - 32 IU/L  15      Lipid Panel     Component Value Date/Time   CHOL 153 11/24/2020 0915   TRIG 138 11/24/2020 0915   HDL 73 11/24/2020 0915   CHOLHDL 2.1 11/24/2020 0915   LDLCALC 57 11/24/2020 0915    CBC    Component Value Date/Time   WBC 6.7 07/30/2020 1007   RBC 4.58 07/30/2020 1007   HGB 14.2 07/30/2020 1007   HGB 11.8 05/28/2020 1626   HCT 42.4 07/30/2020 1007   HCT 35.3 05/28/2020 1626   PLT 234 07/30/2020 1007   PLT 257 05/28/2020 1626   MCV 92.6 07/30/2020 1007   MCV 89 05/28/2020 1626   MCH 31.0 07/30/2020 1007   MCHC 33.5 07/30/2020 1007   RDW 12.8 07/30/2020 1007   RDW 12.7 05/28/2020 1626   LYMPHSABS 3.1 05/28/2020 1626   MONOABS 0.5 03/24/2020 1228   EOSABS 0.1 05/28/2020 1626   BASOSABS 0.1 05/28/2020 1626    Lab Results  Component Value Date   HGBA1C 5.4 05/25/2021    Assessment & Plan:  1. Dupuytren's contracture of both hands S/p post previous release surgery - Ambulatory referral to Hand Surgery  2. Pre-diabetes Last A1c had normalized at 5.4 Will check A1c again - metFORMIN (GLUCOPHAGE) 500 MG tablet; Take 1 tablet (500 mg total) by mouth daily with breakfast.  Dispense: 90 tablet; Refill: 1 - Hemoglobin A1c  3. Gastroesophageal reflux disease without esophagitis Controlled - omeprazole (PRILOSEC)  40 MG capsule; Take 1 capsule (40 mg total) by mouth daily.  Dispense: 90 capsule; Refill: 1  4. Nausea Stable - ondansetron (ZOFRAN-ODT) 8 MG disintegrating tablet; Dissolve 1 tablet (8 mg total) by mouth daily as needed for nausea or vomiting.  Dispense:  40 tablet; Refill: 1  5. Mixed hyperlipidemia Controlled Low-cholesterol diet - rosuvastatin (CRESTOR) 20 MG tablet; TAKE 1 TABLET (20 MG TOTAL) BY MOUTH DAILY TO LOWER CHOLESTEROL  Dispense: 90 tablet; Refill: 1 - LP+Non-HDL Cholesterol - CMP14+EGFR  6. Neck pain Status post cervical spine surgery - tiZANidine (ZANAFLEX) 4 MG tablet; Take 1 tablet (4 mg total) by mouth every 8 (eight) hours as needed.  Dispense: 90 tablet; Refill: 1 - Ambulatory referral to Physical Therapy  7. Low back pain with radiation Uncontrolled With associated left leg pain She is already on a muscle relaxant and tramadol She stated PT made this worse in the past - gabapentin (NEURONTIN) 300 MG capsule; Take 1 capsule (300 mg total) by mouth 3 (three) times daily.  Dispense: 270 capsule; Refill: 1 - tiZANidine (ZANAFLEX) 4 MG tablet; Take 1 tablet (4 mg total) by mouth every 8 (eight) hours as needed.  Dispense: 90 tablet; Refill: 1 - traMADol (ULTRAM) 50 MG tablet; Take 1 tablet (50 mg total) by mouth at bedtime as needed.  Dispense: 30 tablet; Refill: 3  8. Menopause Status post hysterectomy She is currently on Estrace - CBC with Differential/Platelet - Estradiol - FSH/LH  9. Moderate episode of recurrent major depressive disorder (HCC) Controlled - escitalopram (LEXAPRO) 20 MG tablet; Take 1 tablet (20 mg total) by mouth daily.  Dispense: 90 tablet; Refill: 1  10. Other insomnia - QUEtiapine (SEROQUEL) 100 MG tablet; Take 1 tablet (100 mg total) by mouth at bedtime.  Dispense: 90 tablet; Refill: 1   Meds ordered this encounter  Medications   escitalopram (LEXAPRO) 20 MG tablet    Sig: Take 1 tablet (20 mg total) by mouth daily.    Dispense:   90 tablet    Refill:  1   gabapentin (NEURONTIN) 300 MG capsule    Sig: Take 1 capsule (300 mg total) by mouth 3 (three) times daily.    Dispense:  270 capsule    Refill:  1   metFORMIN (GLUCOPHAGE) 500 MG tablet    Sig: Take 1 tablet (500 mg total) by mouth daily with breakfast.    Dispense:  90 tablet    Refill:  1   omeprazole (PRILOSEC) 40 MG capsule    Sig: Take 1 capsule (40 mg total) by mouth daily.    Dispense:  90 capsule    Refill:  1   ondansetron (ZOFRAN-ODT) 8 MG disintegrating tablet    Sig: Dissolve 1 tablet (8 mg total) by mouth daily as needed for nausea or vomiting.    Dispense:  40 tablet    Refill:  1   QUEtiapine (SEROQUEL) 100 MG tablet    Sig: Take 1 tablet (100 mg total) by mouth at bedtime.    Dispense:  90 tablet    Refill:  1   rosuvastatin (CRESTOR) 20 MG tablet    Sig: TAKE 1 TABLET (20 MG TOTAL) BY MOUTH DAILY TO LOWER CHOLESTEROL    Dispense:  90 tablet    Refill:  1   tiZANidine (ZANAFLEX) 4 MG tablet    Sig: Take 1 tablet (4 mg total) by mouth every 8 (eight) hours as needed.    Dispense:  90 tablet    Refill:  1   traMADol (ULTRAM) 50 MG tablet    Sig: Take 1 tablet (50 mg total) by mouth at bedtime as needed.    Dispense:  30 tablet    Refill:  3    Follow-up: Return in about  3 months (around 12/23/2022) for Chronic medical conditions.       Hoy Register, MD, FAAFP. [facility] [place], [place] [phone]   09/22/2022, 1:11 PM

## 2022-09-22 NOTE — Patient Instructions (Signed)
Dupuytren's Contracture Dupuytren's contracture can be a hard condition to deal with. Your health care provider and those close to you can help you manage it. When you have this condition, the tissue under the skin of your palm gets thick. This causes one or more of your fingers to curl inward (contract) toward the palm. After a while, the fingers may not be able to straighten out. This condition can affect some or all of your fingers and the palms of both hands. Dupuytren's contracture is a long-term (chronic) condition that develops (progresses) slowly over time. While there is no cure yet, symptoms can be managed and treatment can slow it down. This condition is usually not dangerous but it can interfere with your everyday tasks. What are the causes?  This condition is caused by tissue (fascia) in your palm that gets thicker and tighter. When the tissue thickens, it pulls on the cords of your tissue (tendons) that control finger movement. This causes the fingers to contract. The cause of your tissue getting thick is not known. However, the condition is often passed along from parent to child (inherited). What increases the risk? Being 58 years of age or older. Being female. Having a family history of this condition. Using tobacco products, including cigarettes, chewing tobacco, and e-cigarettes. Drinking too much alcohol. Having diabetes. Having a seizure disorder. What are the signs or symptoms? Early symptoms of this condition may include: Thick, wrinkled skin on the hand. One or more lumps (nodules) on the palm. Lumps may sometimes feel tender or painful. Later symptoms of this condition may include: Thick cords of tissue in the palm. Fingers curled up toward the palm. Inability to straighten the fingers into their usual position. Discomfort when holding or grabbing objects. How is this diagnosed? This condition is diagnosed with a physical exam. This may include: Looking at your hands  and feeling your palms. This is to check for thickened tissue and lumps. Measuring finger motion. Doing the [place] tabletop test. This is where you may try to put your hand on a surface, with your palm down and your fingers straight out. How is this treated? There is no cure for this condition, but treatment can relieve your discomfort and make symptoms more manageable. Treatment options may include: Physical therapy. This can strengthen your hand and increase flexibility. Occupational therapy. This can help you with everyday tasks that may have become more difficult. Shots (injections). Substances may be injected into your hand, such as: Medicines that help to decrease swelling and pain (corticosteroids). Enzymes (collagenase) to weaken thick tissue. After a collagenase injection, your provider may stretch your fingers. Needle aponeurotomy. A needle is pushed through the skin and into the tissue. Moving the needle against the tissues can weaken or break up the thick tissue. Surgery. This may be needed if your condition causes discomfort or interferes with everyday activities. Physical therapy is usually needed after surgery. No treatment is guaranteed to cure this condition. It is common for the condition to come back. Follow these instructions at home: Hand care Take these actions to help protect your hand from possible injury: Use tools that have padded grips. Wear protective gloves while you work with your hands. Avoid repeated hand movements. General instructions Take over-the-counter and prescription medicines only as told by your provider. Manage any other conditions that you have, such as diabetes. If physical therapy was prescribed, do exercises as told by your provider. Do not use any products that contain nicotine or tobacco. These products  include cigarettes, chewing tobacco, and vaping devices, such as e-cigarettes. If you need help quitting, ask your provider. If you drink  alcohol: Limit how much you have to: 0-1 drink a day if you are female. 0-2 drinks a day if you are female. Know how much alcohol is in your drink. In the [place] one drink is one 12 oz bottle of beer (355 mL), one 5 oz glass of wine (148 mL), or one 1 oz glass of hard liquor (44 mL). Keep all follow-up visits. Your provider will check to see if your treatments need adjusting. Where to find support Dupuytren Research [employer]: dupuytrens.org International Dupuytren Society: dupuytren-online.info Contact a health care provider if: You develop new symptoms or your symptoms get worse. You have pain that gets worse or does not get better with medicine. You have difficulty or discomfort with everyday tasks. You develop numbness or tingling. Get help right away if: You have severe pain. Your fingers change color or become unusually cold. This information is not intended to replace advice given to you by your health care provider. Make sure you discuss any questions you have with your health care provider. Document Revised: 04/26/2022 Document Reviewed: 03/17/2022 Elsevier Patient Education  2024 [employer].

## 2022-09-22 NOTE — Progress Notes (Signed)
Referral to hand specialist Medication refills Discuss menopause.

## 2022-09-23 ENCOUNTER — Other Ambulatory Visit: Payer: Self-pay

## 2022-09-23 LAB — CMP14+EGFR
ALT: 15 IU/L (ref 0–32)
AST: 18 IU/L (ref 0–40)
Albumin/Globulin Ratio: 1.8 (ref 1.2–2.2)
Albumin: 4.7 g/dL (ref 3.8–4.9)
Alkaline Phosphatase: 78 IU/L (ref 44–121)
BUN/Creatinine Ratio: 6 — ABNORMAL LOW (ref 9–23)
BUN: 4 mg/dL — ABNORMAL LOW (ref 6–24)
Bilirubin Total: <0.2 mg/dL (ref 0.0–1.2)
CO2: 26 mmol/L (ref 20–29)
Calcium: 9.5 mg/dL (ref 8.7–10.2)
Chloride: 101 mmol/L (ref 96–106)
Creatinine, Ser: 0.68 mg/dL (ref 0.57–1.00)
Globulin, Total: 2.6 g/dL (ref 1.5–4.5)
Glucose: 111 mg/dL — ABNORMAL HIGH (ref 70–99)
Potassium: 4 mmol/L (ref 3.5–5.2)
Sodium: 140 mmol/L (ref 134–144)
Total Protein: 7.3 g/dL (ref 6.0–8.5)
eGFR: 105 mL/min/{1.73_m2} (ref >59)

## 2022-09-23 LAB — CBC WITH DIFFERENTIAL/PLATELET
Basophils Absolute: 0.1 10*3/uL (ref 0.0–0.2)
Basos: 1 %
EOS (ABSOLUTE): 0.6 10*3/uL — ABNORMAL HIGH (ref 0.0–0.4)
Eos: 8 %
Hematocrit: 43.1 % (ref 34.0–46.6)
Hemoglobin: 14.1 g/dL (ref 11.1–15.9)
Immature Grans (Abs): 0 10*3/uL (ref 0.0–0.1)
Immature Granulocytes: 0 %
Lymphocytes Absolute: 2 10*3/uL (ref 0.7–3.1)
Lymphs: 29 %
MCH: 30.1 pg (ref 26.6–33.0)
MCHC: 32.7 g/dL (ref 31.5–35.7)
MCV: 92 fL (ref 79–97)
Monocytes Absolute: 0.5 10*3/uL (ref 0.1–0.9)
Monocytes: 7 %
Neutrophils Absolute: 3.8 10*3/uL (ref 1.4–7.0)
Neutrophils: 55 %
Platelets: 271 10*3/uL (ref 150–450)
RBC: 4.68 x10E6/uL (ref 3.77–5.28)
RDW: 12.9 % (ref 11.7–15.4)
WBC: 7 10*3/uL (ref 3.4–10.8)

## 2022-09-23 LAB — LP+NON-HDL CHOLESTEROL
Cholesterol, Total: 202 mg/dL — ABNORMAL HIGH (ref 100–199)
HDL: 67 mg/dL (ref >39)
LDL Chol Calc (NIH): 105 mg/dL — ABNORMAL HIGH (ref 0–99)
Total Non-HDL-Chol (LDL+VLDL): 135 mg/dL — ABNORMAL HIGH (ref 0–129)
Triglycerides: 177 mg/dL — ABNORMAL HIGH (ref 0–149)
VLDL Cholesterol Cal: 30 mg/dL (ref 5–40)

## 2022-09-23 LAB — FSH/LH
FSH: 55.7 m[IU]/mL
LH: 42.1 m[IU]/mL

## 2022-09-23 LAB — HEMOGLOBIN A1C
Est. average glucose Bld gHb Est-mCnc: 120 mg/dL
Hgb A1c MFr Bld: 5.8 % — ABNORMAL HIGH (ref 4.8–5.6)

## 2022-09-23 LAB — ESTRADIOL: Estradiol: <5 pg/mL

## 2022-10-01 ENCOUNTER — Other Ambulatory Visit: Payer: Self-pay | Admitting: Family Medicine

## 2022-10-01 DIAGNOSIS — Z1231 Encounter for screening mammogram for malignant neoplasm of breast: Secondary | ICD-10-CM

## 2022-10-19 ENCOUNTER — Ambulatory Visit (INDEPENDENT_AMBULATORY_CARE_PROVIDER_SITE_OTHER): Payer: Self-pay | Admitting: Orthopaedic Surgery

## 2022-10-19 ENCOUNTER — Ambulatory Visit
Admission: RE | Admit: 2022-10-19 | Discharge: 2022-10-19 | Disposition: A | Discharge status: Home / Self Care | Payer: Self-pay | Source: Ambulatory Visit

## 2022-10-19 ENCOUNTER — Encounter: Payer: Self-pay | Admitting: Orthopaedic Surgery

## 2022-10-19 VITALS — BP 152/94 | HR 81 | Ht 71.0 in | Wt 152.0 lb

## 2022-10-19 DIAGNOSIS — Z1231 Encounter for screening mammogram for malignant neoplasm of breast: Secondary | ICD-10-CM

## 2022-10-19 DIAGNOSIS — M720 Palmar fascial fibromatosis [Dupuytren]: Secondary | ICD-10-CM

## 2022-10-19 NOTE — Addendum Note (Signed)
Addended by: Rogers Seeds on: 10/19/2022 01:49 PM   Modules accepted: Orders

## 2022-10-19 NOTE — Progress Notes (Signed)
Office Visit Note   Patient: Isabel Evans           Date of Birth: [birth year]           MRN: [id number] Visit Date: 10/19/2022              Requested by: Hoy Register, MD [address] [place] [address],  [place] [address] PCP: Hoy Register, MD   Assessment & Plan: Visit Diagnoses:  1. Dupuytren's contracture of both hands     Plan: Will make an appoint with our hand specialist so she can schedule small finger amputation.  Follow-Up Instructions: No follow-ups on file.   Orders:  No orders of the defined types were placed in this encounter.  No orders of the defined types were placed in this encounter.     Procedures: No procedures performed   Clinical Data: No additional findings.   Subjective: Chief Complaint  Patient presents with   Left Hand - Pain    HPI 52 year old female returns post C5-6 anterior cervical discectomy and fusion.  She has had problems with Dupuytren's contracture and release to [facility] and had severe recurrence and now has finger almost touching palm.  She has problems with small finger getting in the way and she can talk with Dr. Frazier Butt in the past would like to wait for a new hand surgeon to discuss amputation of the fifth finger.  She has some contractures of the opposite hand most significant involving the small finger that does not want any surgery done on her right hand.  Original surgery was done by Dr. Sanjuana Letters in 2015.  Review of Systems unchanged   Objective: Vital Signs: BP (!) 152/94   Pulse 81   Ht 5\' 11"  (1.803 m)   Wt 152 lb (68.9 kg)   LMP 07/04/2017   BMI 21.20 kg/m   Physical Exam unchanged  Ortho Exam recurrent Dupuytren contracture distal palm and finger with ring finger severe contracture involving PIP joint.  Specialty Comments:  MRI LUMBAR SPINE WITHOUT CONTRAST   TECHNIQUE: Multiplanar, multisequence MR imaging of the lumbar spine was performed. No intravenous  contrast was administered.   COMPARISON:  03/15/2018   FINDINGS: Segmentation:  Standard.   Alignment:  Physiologic.   Vertebrae: No acute fracture, evidence of discitis, or aggressive bone lesion.   Conus medullaris and cauda equina: Conus extends to the T12 level. Conus and cauda equina appear normal.   Paraspinal and other soft tissues: No acute paraspinal abnormality.   Disc levels:   Disc spaces: Disc spaces are maintained.   T12-L1: No significant disc bulge. No neural foraminal stenosis. No central canal stenosis.   L1-L2: No significant disc bulge. No neural foraminal stenosis. No central canal stenosis.   L2-L3: Mild broad-based disc bulge with a broad right paracentral disc protrusion. Right subarticular recess and foraminal narrowing. No left foraminal stenosis. No spinal stenosis.   L3-L4: No significant disc bulge. No neural foraminal stenosis. No central canal stenosis.   L4-L5: Broad-based disc bulge with a broad shallow left foraminal disc protrusion. Mild bilateral foraminal narrowing. No spinal stenosis.   L5-S1: Broad shallow left foraminal disc protrusion contacting the left L5 nerve root. Moderate left foraminal stenosis. No right foraminal stenosis. No spinal stenosis.   IMPRESSION: 1. At L5-S1 there is a broad shallow left foraminal disc protrusion contacting the left L5 nerve root. Moderate left foraminal stenosis. 2. At L4-5 there is a broad-based disc bulge with a  broad shallow left foraminal disc protrusion. Mild bilateral foraminal narrowing. 3. At L2-3 there is a mild broad-based disc bulge with a broad right paracentral disc protrusion. Right subarticular recess and foraminal narrowing. 4.  No acute osseous injury of the lumbar spine.     Electronically Signed   By: Elige Ko M.D.   On: 06/21/2021 13:52  Imaging: No results found.   PMFS History: Patient Active Problem List   Diagnosis Date Noted   Skin rash 01/26/2022    Family history of cancer 04/08/2021   Menopause 04/08/2021   S/P cervical spinal fusion 09/16/2020   Cervical spinal stenosis 07/30/2020   Other spondylosis with radiculopathy, cervical region 06/18/2020   Major depressive disorder, recurrent episode, moderate (HCC) 05/26/2020   Protrusion of cervical intervertebral disc 03/22/2018   Anemia 11/24/2017   Pre-diabetes 11/24/2017   Blurry vision, bilateral 05/18/2017   Callus of foot 05/18/2017   Pain in joint of left hip 02/16/2017   Bruises easily 11/10/2016   Gastroesophageal reflux disease without esophagitis 09/22/2016   Fatigue associated with anemia 09/22/2016   Flexion contractures 09/22/2016   Generalized anxiety disorder 01/01/2016   Healthcare maintenance 05/08/2015   Stress at home 10/24/2014   Irritable bowel syndrome 10/24/2014   Anxiety state 10/24/2014   Pap smear for cervical cancer screening 09/19/2014   Midline low back pain without sciatica 08/12/2014   Heberden nodes 08/12/2014   Contracture of joint, hand 08/12/2014   Essential hypertension 02/11/2014   Allergy 02/11/2014   Screening for breast cancer 02/11/2014   Dupuytren's contracture of both hands 11/15/2013   Hallux valgus of right foot 11/15/2013   Back pain 05/21/2013   Multiple skin nodules 11/27/2012   Fibromyalgia 09/08/2012   Osteoarthritis 09/08/2012   Neck muscle spasm 07/14/2012   Plantar wart 07/14/2012   Insomnia 07/14/2012   Past Medical History:  Diagnosis Date   Anemia 11/24/2017   Anxiety    Bone spur    cervical spine   Chronic kidney disease 04/2017   kidney infections   Depression    Deviated nasal septum    states is unable to lie flat, because her nose will become congested and she can stop breathing   Diabetes mellitus without complication (HCC)    Dysrhythmia    heart flutters occasionally   Fibromyalgia    Fibromyalgia    GERD (gastroesophageal reflux disease)    Hallux abductovalgus with bunions 09/2014   right     Hammertoe 09/2014   right 4th, 5th   History of stomach ulcers    Hyperlipidemia    Hypertension    states is under control with med., has been on med. x 8 mos.   IBS (irritable bowel syndrome)    no current med.   Osteoarthritis    hands, bilateral hips   Peripheral vascular disease (HCC)    occasional swelling in both legs   Sinus headache    UTI (urinary tract infection) 05/21/2013    Family History  Problem Relation Age of Onset   Hypertension Mother    Diabetes Mother    Heart disease Mother    Hypertension Father    Diabetes Father    Prostate cancer Father    Heart disease Father    Alcohol abuse Father    Hypertension Sister    Diabetes Sister    Heart disease Maternal Grandmother        Great GM   Breast cancer Maternal Grandmother    Bone cancer Maternal  Grandmother    Breast cancer Maternal Aunt    Ovarian cancer Maternal Aunt    Rheum arthritis Sister    Colon cancer Other        great Aunt   Rectal cancer Neg Hx    Stomach cancer Neg Hx     Past Surgical History:  Procedure Laterality Date   ABDOMINAL HYSTERECTOMY     ANTERIOR CERVICAL DECOMP/DISCECTOMY FUSION N/A 07/30/2020   Procedure: C5-6 ANTERIOR CERVICAL DECOMPRESSION/DISCECTOMY FUSION, ALLOGRAFT, PLATE;  Surgeon: Eldred Manges, MD;  [facility]: [facility];  Service: Orthopedics;  Laterality: N/A;   BREAST BIOPSY Left 04/2018   benign   BREAST BIOPSY Left 2016   benign   BUNIONECTOMY Right 10/04/2014   Procedure:  Serafina Royals, Donzetta Sprung;  Surgeon: Vivi Barrack, DPM;  Location: [facility];  Service: Podiatry;  Laterality: Right;   COLONOSCOPY WITH PROPOFOL  02/13/2013   CYSTOSCOPY N/A 07/07/2017   Procedure: CYSTOSCOPY;  Surgeon: Grayson Bing, MD;  Location: [facility];  Service: Gynecology;  Laterality: N/A;   DUPUYTREN / PALMAR FASCIOTOMY Right 07/18/2013   exc. 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   small finger   HAMMER TOE SURGERY  Right 10/04/2014   Procedure: HAMMER TOE REPAIR 4TH  AND 5TH  RIGHT FOOT  fourth toe fixation with k wire;  Surgeon: Vivi Barrack, DPM;  Location:  SURGERY CENTER;  Service: Podiatry;  Laterality: Right;   LEG SURGERY Left    as a child; near amputation of leg   TUBAL LIGATION     VAGINAL HYSTERECTOMY N/A 07/07/2017   Procedure: HYSTERECTOMY VAGINAL;  Surgeon:  Bing, MD;  Location: [facility];  Service: Gynecology;  Laterality: N/A;   Social History   Occupational History   Occupation: cook  Tobacco Use   Smoking status: Former    Years: 20    Types: Cigarettes, E-cigarettes    Start date: 01/19/2021    Quit date: 08/24/2012    Years since quitting: 10.1   Smokeless tobacco: Never  Vaping Use   Vaping Use: Every day   Substances: Flavoring  Substance and Sexual Activity   Alcohol use: Yes    Comment: occasionally   Drug use: Yes    Types: Marijuana    Comment: occ.   Sexual activity: Yes    Partners: Male    Birth control/protection: Surgical    Comment: 1 partner

## 2022-10-25 ENCOUNTER — Other Ambulatory Visit: Payer: Self-pay | Admitting: Family Medicine

## 2022-10-25 DIAGNOSIS — N6310 Unspecified lump in the right breast, unspecified quadrant: Secondary | ICD-10-CM

## 2022-11-01 ENCOUNTER — Other Ambulatory Visit: Payer: Self-pay

## 2022-11-04 ENCOUNTER — Other Ambulatory Visit: Payer: Self-pay

## 2022-11-22 ENCOUNTER — Ambulatory Visit (INDEPENDENT_AMBULATORY_CARE_PROVIDER_SITE_OTHER): Payer: Self-pay | Admitting: Orthopedic Surgery

## 2022-11-22 ENCOUNTER — Other Ambulatory Visit (INDEPENDENT_AMBULATORY_CARE_PROVIDER_SITE_OTHER): Payer: Self-pay

## 2022-11-22 DIAGNOSIS — M720 Palmar fascial fibromatosis [Dupuytren]: Secondary | ICD-10-CM

## 2022-11-22 NOTE — Progress Notes (Addendum)
Isabel Evans - 52 y.o. female MRN [id number]  Date of birth: 1970/09/22  Office Visit Note: Visit Date: 11/22/2022 PCP: Hoy Register, MD Referred by: Eldred Manges, MD  Subjective: Chief Complaint  Patient presents with   Right Hand - Pain   Left Hand - Pain   HPI: Isabel Evans is a pleasant 52 y.o. female who presents today for surgical discussion regarding left small finger severe Dupuytren's contracture to the MP and PIP.  She was previously evaluated by Dr. Ophelia Charter, who referred her to me for discussion regarding potential small finger MP disarticulation.  She reports that the contracture is quite severe, is functionally limiting, small finger is essentially in her palm at this point on the left side.  She does have a family history of Dupuytren's disease in both her dad and aunt.  She does also have foot nodules.  There is evidence of Dupuytren's contracture to the right small finger as well, however this is less severe in nature.  Visit Reason: bilateral hand; dupuytrens Hand dominance: right Occupation: Disabled Diabetic: Yes/ 5.8  Prior Testing: None Injections: None  Treatments: None Prior Surgery: Surgery 7 years ago on left small finger   *discuss amputation- left small* *right small finger is starting to do the same*  Pertinent ROS were reviewed with the patient and found to be negative unless otherwise specified above in HPI.   Assessment & Plan: Visit Diagnoses:  1. Dupuytren's contracture of both hands     Plan: Extensive discussion was had with patient today regarding her bilateral hand Dupuytren's disease.  She is well aware of the etiology and pathophysiology of this disease and is interested today in scheduling for left small finger MP disarticulation/amputation.  I am in agreement with this plan given the severity of her contracture, it is unlikely that we would be able to achieve significant correction with either collagenase injection or  Dupuytren's excision.  She is indicated for left small finger MP disarticulation/amputation.    Risk and benefit of the surgery were discussed in detail today, risk including but not limited to infection, bleeding, scarring, stiffness, nerve injury or vascular, tendon injury, risk of recurrence and need for subsequent operation.  We also discussed the importance of smoking cessation and proper diabetic control particular for wound healing.  Patient demonstrate understanding the above.  She is interested in scheduling for approximately 2 months from now, surgical scheduling will contact her to arrange an appropriate date.  Follow-up: No follow-ups on file.   Meds & Orders: No orders of the defined types were placed in this encounter.   Orders Placed This Encounter  Procedures   XR Hand Complete Right   XR Hand Complete Left     Procedures: No procedures performed      Clinical History: MRI LUMBAR SPINE WITHOUT CONTRAST   TECHNIQUE: Multiplanar, multisequence MR imaging of the lumbar spine was performed. No intravenous contrast was administered.   COMPARISON:  03/15/2018   FINDINGS: Segmentation:  Standard.   Alignment:  Physiologic.   Vertebrae: No acute fracture, evidence of discitis, or aggressive bone lesion.   Conus medullaris and cauda equina: Conus extends to the T12 level. Conus and cauda equina appear normal.   Paraspinal and other soft tissues: No acute paraspinal abnormality.   Disc levels:   Disc spaces: Disc spaces are maintained.   T12-L1: No significant disc bulge. No neural foraminal stenosis. No central canal stenosis.   L1-L2: No significant disc bulge. No  neural foraminal stenosis. No central canal stenosis.   L2-L3: Mild broad-based disc bulge with a broad right paracentral disc protrusion. Right subarticular recess and foraminal narrowing. No left foraminal stenosis. No spinal stenosis.   L3-L4: No significant disc bulge. No neural foraminal  stenosis. No central canal stenosis.   L4-L5: Broad-based disc bulge with a broad shallow left foraminal disc protrusion. Mild bilateral foraminal narrowing. No spinal stenosis.   L5-S1: Broad shallow left foraminal disc protrusion contacting the left L5 nerve root. Moderate left foraminal stenosis. No right foraminal stenosis. No spinal stenosis.   IMPRESSION: 1. At L5-S1 there is a broad shallow left foraminal disc protrusion contacting the left L5 nerve root. Moderate left foraminal stenosis. 2. At L4-5 there is a broad-based disc bulge with a broad shallow left foraminal disc protrusion. Mild bilateral foraminal narrowing. 3. At L2-3 there is a mild broad-based disc bulge with a broad right paracentral disc protrusion. Right subarticular recess and foraminal narrowing. 4.  No acute osseous injury of the lumbar spine.     Electronically Signed   By: Elige Ko M.D.   On: 06/21/2021 13:52  She reports that she quit smoking about 10 years ago. Her smoking use included cigarettes and e-cigarettes. She started smoking about 30 years ago. She has never used smokeless tobacco.  Recent Labs    09/22/22 1144  HGBA1C 5.8*    Objective:   Vital Signs: LMP 07/04/2017   Physical Exam  Gen: Well-appearing, in no acute distress; non-toxic CV: Regular Rate. Well-perfused. Warm.  Resp: Breathing unlabored on room air; no wheezing. Psych: Fluid speech in conversation; appropriate affect; normal thought process Neuro: Sensation intact throughout. No gross coordination deficits.   Ortho Exam Left hand - Cord palpable in line with the small finger, MP contracture 90 degrees, PIP contracture 90 degrees - Positive tabletop test - Palpable nodule in line with the index finger without significant contracture to this digit  Right hand: - Cord palpable in line with the small finger, MP contracture near neutral, PIP contracture 70 degrees - Positive tabletop test  Imaging: XR Hand  Complete Left  Result Date: 11/22/2022 3 views of the left hand were completed today X-rays demonstrate stable appearance of the radiocarpal and midcarpal joints without significant abnormalities.  Notable contracture to the MP and PIP of the small finger is evident.  XR Hand Complete Right  Result Date: 11/22/2022 3 views of the right hand were completed today X-rays demonstrate stable appearance of the radiocarpal and midcarpal joints without significant abnormalities.  Notable contracture to the PIP of the small finger is evident.   Past Medical/Family/Surgical/Social History: Medications & Allergies reviewed per EMR, new medications updated. Patient Active Problem List   Diagnosis Date Noted   Skin rash 01/26/2022   Family history of cancer 04/08/2021   Menopause 04/08/2021   S/P cervical spinal fusion 09/16/2020   Cervical spinal stenosis 07/30/2020   Other spondylosis with radiculopathy, cervical region 06/18/2020   Major depressive disorder, recurrent episode, moderate (HCC) 05/26/2020   Protrusion of cervical intervertebral disc 03/22/2018   Anemia 11/24/2017   Pre-diabetes 11/24/2017   Blurry vision, bilateral 05/18/2017   Callus of foot 05/18/2017   Pain in joint of left hip 02/16/2017   Bruises easily 11/10/2016   Gastroesophageal reflux disease without esophagitis 09/22/2016   Fatigue associated with anemia 09/22/2016   Flexion contractures 09/22/2016   Generalized anxiety disorder 01/01/2016   Healthcare maintenance 05/08/2015   Stress at home 10/24/2014   Irritable bowel  syndrome 10/24/2014   Anxiety state 10/24/2014   Pap smear for cervical cancer screening 09/19/2014   Midline low back pain without sciatica 08/12/2014   Heberden nodes 08/12/2014   Contracture of joint, hand 08/12/2014   Essential hypertension 02/11/2014   Allergy 02/11/2014   Screening for breast cancer 02/11/2014   Dupuytren's contracture of both hands 11/15/2013   Hallux valgus of right foot  11/15/2013   Back pain 05/21/2013   Multiple skin nodules 11/27/2012   Fibromyalgia 09/08/2012   Osteoarthritis 09/08/2012   Neck muscle spasm 07/14/2012   Plantar wart 07/14/2012   Insomnia 07/14/2012   Past Medical History:  Diagnosis Date   Anemia 11/24/2017   Anxiety    Bone spur    cervical spine   Chronic kidney disease 04/2017   kidney infections   Depression    Deviated nasal septum    states is unable to lie flat, because her nose will become congested and she can stop breathing   Diabetes mellitus without complication (HCC)    Dysrhythmia    heart flutters occasionally   Fibromyalgia    Fibromyalgia    GERD (gastroesophageal reflux disease)    Hallux abductovalgus with bunions 09/2014   right    Hammertoe 09/2014   right 4th, 5th   History of stomach ulcers    Hyperlipidemia    Hypertension    states is under control with med., has been on med. x 8 mos.   IBS (irritable bowel syndrome)    no current med.   Osteoarthritis    hands, bilateral hips   Peripheral vascular disease (HCC)    occasional swelling in both legs   Sinus headache    UTI (urinary tract infection) 05/21/2013   Family History  Problem Relation Age of Onset   Hypertension Mother    Diabetes Mother    Heart disease Mother    Hypertension Father    Diabetes Father    Prostate cancer Father    Heart disease Father    Alcohol abuse Father    Hypertension Sister    Diabetes Sister    Heart disease Maternal Grandmother        Great GM   Breast cancer Maternal Grandmother    Bone cancer Maternal Grandmother    Breast cancer Maternal Aunt    Ovarian cancer Maternal Aunt    Rheum arthritis Sister    Colon cancer Other        great Aunt   Rectal cancer Neg Hx    Stomach cancer Neg Hx    Past Surgical History:  Procedure Laterality Date   ABDOMINAL HYSTERECTOMY     ANTERIOR CERVICAL DECOMP/DISCECTOMY FUSION N/A 07/30/2020   Procedure: C5-6 ANTERIOR CERVICAL DECOMPRESSION/DISCECTOMY  FUSION, ALLOGRAFT, PLATE;  Surgeon: Eldred Manges, MD;  Location: [facility];  Service: Orthopedics;  Laterality: N/A;   BREAST BIOPSY Left 04/2018   benign   BREAST BIOPSY Left 2016   benign   BUNIONECTOMY Right 10/04/2014   Procedure:  Serafina Royals, Donzetta Sprung;  Surgeon: Vivi Barrack, DPM;  Location: [facility];  Service: Podiatry;  Laterality: Right;   COLONOSCOPY WITH PROPOFOL  02/13/2013   CYSTOSCOPY N/A 07/07/2017   Procedure: CYSTOSCOPY;  Surgeon: Oglesby Bing, MD;  Location: [facility];  Service: Gynecology;  Laterality: N/A;   DUPUYTREN / PALMAR FASCIOTOMY Right 07/18/2013   exc. 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   small finger   HAMMER TOE SURGERY Right 10/04/2014  Procedure: HAMMER TOE REPAIR 4TH  AND 5TH  RIGHT FOOT  fourth toe fixation with k wire;  Surgeon: Vivi Barrack, DPM;  Location: [facility];  Service: Podiatry;  Laterality: Right;   LEG SURGERY Left    as a child; near amputation of leg   TUBAL LIGATION     VAGINAL HYSTERECTOMY N/A 07/07/2017   Procedure: HYSTERECTOMY VAGINAL;  Surgeon: Scapp[place] Bing, MD;  Location: [facility];  Service: Gynecology;  Laterality: N/A;   Social History   Occupational History   Occupation: cook  Tobacco Use   Smoking status: Former    Current packs/day: 0.00    Types: Cigarettes, E-cigarettes    Start date: 08/24/1992    Quit date: 08/24/2012    Years since quitting: 10.2   Smokeless tobacco: Never  Vaping Use   Vaping status: Every Day   Substances: Flavoring  Substance and Sexual Activity   Alcohol use: Yes    Comment: occasionally   Drug use: Yes    Types: Marijuana    Comment: occ.   Sexual activity: Yes    Partners: Male    Birth control/protection: Surgical    Comment: 1 partner     Fara Boros) Denese Killings, M.D. [facility] 1:22 PM

## 2022-12-05 ENCOUNTER — Other Ambulatory Visit: Payer: Self-pay | Admitting: Family Medicine

## 2022-12-05 DIAGNOSIS — M545 Low back pain, unspecified: Secondary | ICD-10-CM

## 2022-12-05 DIAGNOSIS — M2550 Pain in unspecified joint: Secondary | ICD-10-CM

## 2022-12-05 DIAGNOSIS — M542 Cervicalgia: Secondary | ICD-10-CM

## 2022-12-06 ENCOUNTER — Other Ambulatory Visit: Payer: Self-pay

## 2022-12-07 ENCOUNTER — Other Ambulatory Visit: Payer: Self-pay

## 2022-12-07 MED ORDER — DICLOFENAC SODIUM 50 MG PO TBEC
50.0000 mg | DELAYED_RELEASE_TABLET | Freq: Two times a day (BID) | ORAL | 1 refills | Status: DC
Start: 2022-12-07 — End: 2023-02-28
  Filled 2022-12-07 – 2023-02-25 (×6): qty 60, 30d supply, fill #0

## 2022-12-07 MED ORDER — TIZANIDINE HCL 4 MG PO TABS
4.0000 mg | ORAL_TABLET | Freq: Three times a day (TID) | ORAL | 1 refills | Status: DC | PRN
Start: 2022-12-07 — End: 2023-02-24
  Filled 2022-12-07 – 2022-12-29 (×2): qty 90, 30d supply, fill #0
  Filled 2023-01-24: qty 90, 30d supply, fill #1

## 2022-12-07 NOTE — Telephone Encounter (Signed)
Requested medication (s) are due for refill today: yes  Requested medication (s) are on the active medication list: yes  Last refill:  09/22/22  Future visit scheduled: yes  Notes to clinic:  Unable to refill per protocol, cannot delegate.      Requested Prescriptions  Pending Prescriptions Disp Refills   tiZANidine (ZANAFLEX) 4 MG tablet 90 tablet 1    Sig: Take 1 tablet (4 mg total) by mouth every 8 (eight) hours as needed.     Not Delegated - Cardiovascular:  Alpha-2 Agonists - tizanidine Failed - 12/05/2022 10:51 PM      Failed - This refill cannot be delegated      Passed - Valid encounter within last 6 months    Recent Outpatient Visits           2 months ago Dupuytren's contracture of both hands   [facility] Hoy Register, MD   10 months ago Essential hypertension   [place] [facility] Storm Frisk, MD   1 year ago Pre-diabetes   [place] [facility] Hoy Register, MD   2 years ago Vasomotor symptoms due to menopause   [place] [facility] Hoy Register, MD   2 years ago UTI symptoms   [place] [facility] [place], Shea Stakes, NP       Future Appointments             In 2 weeks Hoy Register, MD [facility]             diclofenac (VOLTAREN) 50 MG EC tablet 60 tablet 0    Sig: TAKE 1 TABLET (50 MG TOTAL) BY MOUTH 2 (TWO) TIMES DAILY AFTER A MEAL AS NEEDED FOR PAIN     Analgesics:  NSAIDS Failed - 12/05/2022 10:51 PM      Failed - Manual Review: Labs are only required if the patient has taken medication for more than 8 weeks.      Passed - Cr in normal range and within 360 days    Creat  Date Value Ref Range Status  11/27/2015 0.75 0.50 - 1.10 mg/dL Final   Creatinine, Ser  Date Value Ref Range Status  09/22/2022 0.68 0.57 - 1.00 mg/dL Final         Passed - HGB in  normal range and within 360 days    Hemoglobin  Date Value Ref Range Status  09/22/2022 14.1 11.1 - 15.9 g/dL Final         Passed - PLT in normal range and within 360 days    Platelets  Date Value Ref Range Status  09/22/2022 271 150 - 450 x10E3/uL Final         Passed - HCT in normal range and within 360 days    Hematocrit  Date Value Ref Range Status  09/22/2022 43.1 34.0 - 46.6 % Final         Passed - eGFR is 30 or above and within 360 days    GFR calc Af Amer  Date Value Ref Range Status  05/28/2020 69 >59 mL/min/1.73 Final    Comment:    **In accordance with recommendations from the NKF-ASN Task force,**   [facility] is in the process of updating its eGFR calculation to the   2021 CKD-EPI creatinine equation that estimates kidney function   without a race variable.  GFR, Estimated  Date Value Ref Range Status  07/30/2020 >60 >60 mL/min Final    Comment:    (NOTE) Calculated using the CKD-EPI Creatinine Equation (2021)    GFR  Date Value Ref Range Status  03/24/2020 89.57 >60.00 mL/min Final    Comment:    Calculated using the CKD-EPI Creatinine Equation (2021)   eGFR  Date Value Ref Range Status  09/22/2022 105 >59 mL/min/1.73 Final         Passed - Patient is not pregnant      Passed - Valid encounter within last 12 months    Recent Outpatient Visits           2 months ago Dupuytren's contracture of both hands   [place] [facility] [place], Odette Horns, MD   10 months ago Essential hypertension   [place] [facility] Storm Frisk, MD   1 year ago Pre-diabetes   [place] [facility] Hoy Register, MD   2 years ago Vasomotor symptoms due to menopause   [place] [facility] Hoy Register, MD   2 years ago UTI symptoms   [place] [facility] [place], Shea Stakes, NP       Future Appointments             In 2 weeks  Hoy Register, MD [facility]

## 2022-12-13 ENCOUNTER — Other Ambulatory Visit: Payer: Self-pay

## 2022-12-15 ENCOUNTER — Other Ambulatory Visit: Payer: Self-pay

## 2022-12-16 ENCOUNTER — Other Ambulatory Visit: Payer: Self-pay

## 2022-12-23 ENCOUNTER — Ambulatory Visit: Payer: Self-pay | Admitting: Family Medicine

## 2022-12-29 ENCOUNTER — Other Ambulatory Visit: Payer: Self-pay

## 2022-12-30 ENCOUNTER — Other Ambulatory Visit: Payer: Self-pay

## 2023-01-10 ENCOUNTER — Other Ambulatory Visit: Payer: Self-pay | Admitting: Family Medicine

## 2023-01-10 ENCOUNTER — Other Ambulatory Visit: Payer: Self-pay

## 2023-01-10 DIAGNOSIS — R110 Nausea: Secondary | ICD-10-CM

## 2023-01-11 MED ORDER — ONDANSETRON 8 MG PO TBDP
8.0000 mg | ORAL_TABLET | Freq: Every day | ORAL | 0 refills | Status: DC | PRN
Start: 2023-01-11 — End: 2023-02-27
  Filled 2023-01-11 – 2023-01-24 (×2): qty 30, 30d supply, fill #0

## 2023-01-11 NOTE — Telephone Encounter (Signed)
Requested medication (s) are due for refill today: Yes  Requested medication (s) are on the active medication list: Yes  Last refill:  09/22/22  Future visit scheduled: Yes  Notes to clinic:  Not delegated.    Requested Prescriptions  Pending Prescriptions Disp Refills   ondansetron (ZOFRAN-ODT) 8 MG disintegrating tablet 40 tablet 1    Sig: Dissolve 1 tablet (8 mg total) by mouth daily as needed for nausea or vomiting.     Not Delegated - Gastroenterology: Antiemetics - ondansetron Failed - 01/10/2023 12:41 PM      Failed - This refill cannot be delegated      Passed - AST in normal range and within 360 days    AST  Date Value Ref Range Status  09/22/2022 18 0 - 40 IU/L Final         Passed - ALT in normal range and within 360 days    ALT  Date Value Ref Range Status  09/22/2022 15 0 - 32 IU/L Final         Passed - Valid encounter within last 6 months    Recent Outpatient Visits           3 months ago Dupuytren's contracture of both hands   [place] [facility] [place], Odette Horns, MD   11 months ago Essential hypertension   [place] [facility] Storm Frisk, MD   1 year ago Pre-diabetes   [place] [facility] Hoy Register, MD   2 years ago Vasomotor symptoms due to menopause   [place] [facility] Hoy Register, MD   2 years ago UTI symptoms   [place] [facility] [place], Shea Stakes, NP       Future Appointments             In 1 month Hoy Register, MD [facility]

## 2023-01-12 ENCOUNTER — Other Ambulatory Visit: Payer: Self-pay

## 2023-01-14 ENCOUNTER — Other Ambulatory Visit: Payer: Self-pay

## 2023-01-20 ENCOUNTER — Other Ambulatory Visit: Payer: Self-pay

## 2023-01-21 ENCOUNTER — Other Ambulatory Visit: Payer: Self-pay

## 2023-01-24 ENCOUNTER — Other Ambulatory Visit: Payer: Self-pay

## 2023-01-25 ENCOUNTER — Telehealth: Payer: Self-pay

## 2023-01-25 ENCOUNTER — Other Ambulatory Visit: Payer: Self-pay

## 2023-01-25 NOTE — Telephone Encounter (Signed)
Patient called wanting to schedule surgery for her pinky. CB # [phone]

## 2023-01-25 NOTE — Telephone Encounter (Signed)
I called patient back. No answer but I was able to leave a message requesting she give me a call back regarding scheduling surgery.

## 2023-01-26 ENCOUNTER — Other Ambulatory Visit: Payer: Self-pay

## 2023-01-27 ENCOUNTER — Other Ambulatory Visit: Payer: Self-pay

## 2023-01-27 ENCOUNTER — Telehealth: Payer: Self-pay

## 2023-01-27 ENCOUNTER — Other Ambulatory Visit: Payer: Self-pay | Admitting: Critical Care Medicine

## 2023-01-27 DIAGNOSIS — M4722 Other spondylosis with radiculopathy, cervical region: Secondary | ICD-10-CM

## 2023-01-27 DIAGNOSIS — G8929 Other chronic pain: Secondary | ICD-10-CM

## 2023-01-27 MED ORDER — LIDOCAINE 5 % EX PTCH
1.0000 | MEDICATED_PATCH | CUTANEOUS | 2 refills | Status: DC
Start: 2023-01-27 — End: 2023-07-11
  Filled 2023-01-27 – 2023-02-11 (×2): qty 30, 30d supply, fill #0
  Filled 2023-02-25 – 2023-04-06 (×3): qty 30, 30d supply, fill #1
  Filled 2023-04-26 – 2023-04-27 (×2): qty 30, 30d supply, fill #2

## 2023-01-27 NOTE — Telephone Encounter (Signed)
Requested Prescriptions  Pending Prescriptions Disp Refills   lidocaine (LIDODERM) 5 % 30 patch 2    Sig: Place 1 patch onto the skin daily. Remove & Discard patch within 12 hours or as directed by MD     Analgesics:  Topicals Failed - 01/27/2023  1:34 PM      Failed - Manual Review: Labs are only required if the patient has taken medication for more than 8 weeks.      Passed - PLT in normal range and within 360 days    Platelets  Date Value Ref Range Status  09/22/2022 271 150 - 450 x10E3/uL Final         Passed - HGB in normal range and within 360 days    Hemoglobin  Date Value Ref Range Status  09/22/2022 14.1 11.1 - 15.9 g/dL Final         Passed - HCT in normal range and within 360 days    Hematocrit  Date Value Ref Range Status  09/22/2022 43.1 34.0 - 46.6 % Final         Passed - Cr in normal range and within 360 days    Creat  Date Value Ref Range Status  11/27/2015 0.75 0.50 - 1.10 mg/dL Final   Creatinine, Ser  Date Value Ref Range Status  09/22/2022 0.68 0.57 - 1.00 mg/dL Final         Passed - eGFR is 30 or above and within 360 days    GFR calc Af Amer  Date Value Ref Range Status  05/28/2020 69 >59 mL/min/1.73 Final    Comment:    **In accordance with recommendations from the NKF-ASN Task force,**   [facility] is in the process of updating its eGFR calculation to the   2021 CKD-EPI creatinine equation that estimates kidney function   without a race variable.    GFR, Estimated  Date Value Ref Range Status  07/30/2020 >60 >60 mL/min Final    Comment:    (NOTE) Calculated using the CKD-EPI Creatinine Equation (2021)    GFR  Date Value Ref Range Status  03/24/2020 89.57 >60.00 mL/min Final    Comment:    Calculated using the CKD-EPI Creatinine Equation (2021)   eGFR  Date Value Ref Range Status  09/22/2022 105 >59 mL/min/1.73 Final         Passed - Patient is not pregnant      Passed - Valid encounter within last 12 months    Recent  Outpatient Visits           4 months ago Dupuytren's contracture of both hands   [place] [facility] [place], Odette Horns, MD   1 year ago Essential hypertension   [place] [facility] Storm Frisk, MD   1 year ago Pre-diabetes   [place] [facility] Hoy Register, MD   2 years ago Vasomotor symptoms due to menopause   [place] [facility] Hoy Register, MD   2 years ago UTI symptoms    [place] [facility] [place], Shea Stakes, NP       Future Appointments             In 4 weeks Hoy Register, MD [facility]

## 2023-01-27 NOTE — Telephone Encounter (Signed)
Pharmacy Patient Advocate Encounter  Received notification from [facility] that Prior Authorization for TRAMADOL has been APPROVED from 01/26/2023 to 07/25/2023   PA #/Case ID/Reference #: [id number]

## 2023-01-27 NOTE — Telephone Encounter (Signed)
Pharmacy Patient Advocate Encounter  Received notification from [facility] that Prior Authorization for lidocaine patch 5% has been APPROVED from 01/12/2023 to 01/26/2024   PA #/Case ID/Reference #: [id number]

## 2023-02-07 ENCOUNTER — Other Ambulatory Visit: Payer: Self-pay

## 2023-02-08 ENCOUNTER — Other Ambulatory Visit: Payer: Self-pay

## 2023-02-09 ENCOUNTER — Other Ambulatory Visit: Payer: Self-pay

## 2023-02-09 ENCOUNTER — Other Ambulatory Visit: Payer: Self-pay | Admitting: Orthopedic Surgery

## 2023-02-09 MED ORDER — OXYCODONE HCL 5 MG PO TABS
5.0000 mg | ORAL_TABLET | Freq: Four times a day (QID) | ORAL | 0 refills | Status: DC | PRN
  Filled 2023-02-09: qty 20, 5d supply, fill #0

## 2023-02-10 ENCOUNTER — Other Ambulatory Visit: Payer: Self-pay

## 2023-02-10 DIAGNOSIS — M720 Palmar fascial fibromatosis [Dupuytren]: Secondary | ICD-10-CM | POA: Diagnosis not present

## 2023-02-14 ENCOUNTER — Other Ambulatory Visit: Payer: Self-pay

## 2023-02-14 ENCOUNTER — Ambulatory Visit (INDEPENDENT_AMBULATORY_CARE_PROVIDER_SITE_OTHER): Payer: Medicaid Other | Admitting: Orthopedic Surgery

## 2023-02-14 DIAGNOSIS — M720 Palmar fascial fibromatosis [Dupuytren]: Secondary | ICD-10-CM

## 2023-02-14 NOTE — Progress Notes (Signed)
Isabel Evans - 52 y.o. female MRN [id number]  Date of birth: [birth year]  Office Visit Note: Visit Date: 02/14/2023 PCP: Hoy Register, MD Referred by: Hoy Register, MD  Subjective:  HPI: Isabel Evans is a 52 y.o. 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  She is doing well, pain is controlled.  Pertinent ROS were reviewed with the patient and found to be negative unless otherwise specified above in HPI.   Assessment & Plan: Visit Diagnoses: No diagnosis found.  Plan: Wound site demonstrates appropriate healing.  Sutures will remain in currently.  Instructed on dressing changes which can be done as needed.  Follow-up in approximately 10 days for wound check and suture removal.  Follow-up: No follow-ups on file.   Meds & Orders: No orders of the defined types were placed in this encounter.  No orders of the defined types were placed in this encounter.    Procedures: No procedures performed       Objective:   Vital Signs: LMP 07/04/2017   Ortho Exam Left hand - Well-healing incision over the small finger MP, sutures in place, skin edges well-approximated without erythema or drainage  Imaging: No results found.   Isabel Evans Trevor Mace, M.D. [facility] 10:50 AM

## 2023-02-15 ENCOUNTER — Other Ambulatory Visit: Payer: Self-pay

## 2023-02-18 ENCOUNTER — Other Ambulatory Visit: Payer: Self-pay

## 2023-02-21 ENCOUNTER — Other Ambulatory Visit: Payer: Self-pay

## 2023-02-24 ENCOUNTER — Ambulatory Visit: Payer: Medicaid Other | Attending: Family Medicine | Admitting: Family Medicine

## 2023-02-24 ENCOUNTER — Encounter: Payer: Self-pay | Admitting: Family Medicine

## 2023-02-24 ENCOUNTER — Other Ambulatory Visit: Payer: Self-pay

## 2023-02-24 VITALS — BP 108/70 | HR 70 | Ht 71.0 in | Wt 156.2 lb

## 2023-02-24 DIAGNOSIS — H539 Unspecified visual disturbance: Secondary | ICD-10-CM

## 2023-02-24 DIAGNOSIS — G548 Other nerve root and plexus disorders: Secondary | ICD-10-CM

## 2023-02-24 DIAGNOSIS — F331 Major depressive disorder, recurrent, moderate: Secondary | ICD-10-CM

## 2023-02-24 DIAGNOSIS — G4709 Other insomnia: Secondary | ICD-10-CM

## 2023-02-24 DIAGNOSIS — E782 Mixed hyperlipidemia: Secondary | ICD-10-CM

## 2023-02-24 DIAGNOSIS — K219 Gastro-esophageal reflux disease without esophagitis: Secondary | ICD-10-CM

## 2023-02-24 DIAGNOSIS — Z23 Encounter for immunization: Secondary | ICD-10-CM | POA: Diagnosis not present

## 2023-02-24 DIAGNOSIS — K582 Mixed irritable bowel syndrome: Secondary | ICD-10-CM

## 2023-02-24 DIAGNOSIS — R7303 Prediabetes: Secondary | ICD-10-CM | POA: Diagnosis not present

## 2023-02-24 DIAGNOSIS — M545 Low back pain, unspecified: Secondary | ICD-10-CM

## 2023-02-24 DIAGNOSIS — Z972 Presence of dental prosthetic device (complete) (partial): Secondary | ICD-10-CM

## 2023-02-24 MED ORDER — DICYCLOMINE HCL 10 MG PO CAPS
10.0000 mg | ORAL_CAPSULE | Freq: Two times a day (BID) | ORAL | 2 refills | Status: DC | PRN
  Filled 2023-02-24: qty 60, 30d supply, fill #0
  Filled 2023-04-26: qty 60, 30d supply, fill #1

## 2023-02-24 MED ORDER — ESCITALOPRAM OXALATE 20 MG PO TABS
20.0000 mg | ORAL_TABLET | Freq: Every day | ORAL | 1 refills | Status: DC
  Filled 2023-02-24: qty 90, 90d supply, fill #0
  Filled 2023-04-17 – 2023-06-03 (×4): qty 90, 90d supply, fill #1

## 2023-02-24 MED ORDER — ROSUVASTATIN CALCIUM 20 MG PO TABS
20.0000 mg | ORAL_TABLET | Freq: Every day | ORAL | 1 refills | Status: DC
  Filled 2023-02-24: qty 90, 90d supply, fill #0
  Filled 2023-04-26 – 2023-06-03 (×2): qty 90, 90d supply, fill #1

## 2023-02-24 MED ORDER — TIZANIDINE HCL 4 MG PO TABS
4.0000 mg | ORAL_TABLET | Freq: Three times a day (TID) | ORAL | 1 refills | Status: DC | PRN
  Filled 2023-02-24: qty 90, 30d supply, fill #0
  Filled 2023-04-10 – 2023-04-24 (×2): qty 90, 30d supply, fill #1

## 2023-02-24 MED ORDER — OMEPRAZOLE 40 MG PO CPDR
40.0000 mg | DELAYED_RELEASE_CAPSULE | Freq: Every day | ORAL | 1 refills | Status: DC
  Filled 2023-02-24: qty 90, 90d supply, fill #0
  Filled 2023-04-26 – 2023-06-03 (×2): qty 90, 90d supply, fill #1

## 2023-02-24 MED ORDER — QUETIAPINE FUMARATE 100 MG PO TABS
100.0000 mg | ORAL_TABLET | Freq: Every day | ORAL | 1 refills | Status: DC
  Filled 2023-02-24: qty 30, 30d supply, fill #0
  Filled 2023-04-13 – 2023-04-26 (×2): qty 30, 30d supply, fill #1
  Filled 2023-06-03: qty 30, 30d supply, fill #2
  Filled 2023-07-07 – 2023-08-30 (×4): qty 30, 30d supply, fill #3
  Filled 2023-09-26 – 2023-10-25 (×3): qty 30, 30d supply, fill #4

## 2023-02-24 MED ORDER — GABAPENTIN 300 MG PO CAPS
300.0000 mg | ORAL_CAPSULE | Freq: Three times a day (TID) | ORAL | 1 refills | Status: DC
  Filled 2023-02-24: qty 270, 90d supply, fill #0
  Filled 2023-04-26 – 2023-06-03 (×2): qty 270, 90d supply, fill #1

## 2023-02-24 MED ORDER — METFORMIN HCL 500 MG PO TABS
500.0000 mg | ORAL_TABLET | Freq: Every day | ORAL | 1 refills | Status: DC
  Filled 2023-02-24: qty 90, 90d supply, fill #0
  Filled 2023-04-26 – 2023-06-03 (×2): qty 90, 90d supply, fill #1

## 2023-02-24 NOTE — Progress Notes (Signed)
Subjective:  Patient ID: Isabel Evans, female    DOB: Nov 18, 1970  Age: 52 y.o. MRN: [id number]  CC: Medical Management of Chronic Issues (Recent fall and having back pain)   HPI Joe Tanney is a 52 y.o. year old female with a history of Anxiety and Depression, spondylosis with cervical radiculopathy (status post cervical spine fusion), Dupuytren's contracture (status post left fifth finger amputation), chronic low back pain.   Interval History: Discussed the use of AI scribe software for clinical note transcription with the patient, who gave verbal consent to proceed.   She presents one week post-op from a left fifth finger amputation due to Dupuytren's contracture. She reports persistent pain at the amputation site, described as phantom pain, and has been managing it with Tylenol and leftover tramadol.  Her surgeon had prescribed oxycodone for her which she has run out of and is now having to take tramadol every 6 hours as needed.  She was previously on tramadol chronically for low back pain.  She also reports a recent fall, which resulted in back and shoulder pain, and has been using tramadol more frequently for this.  The patient's depression is reportedly stable on Lexapro, and she continues to take dicyclomine for IBS and Estrace for menopausal symptoms. She reports difficulty reading and she is with her dentures, requesting referrals for an ophthalmologist and dentist, respectively.   For her prediabetes she remains on metformin.    Past Medical History:  Diagnosis Date   Anemia 11/24/2017   Anxiety    Bone spur    cervical spine   Chronic kidney disease 04/2017   kidney infections   Depression    Deviated nasal septum    states is unable to lie flat, because her nose will become congested and she can stop breathing   Diabetes mellitus without complication (HCC)    Dysrhythmia    heart flutters occasionally   Fibromyalgia    Fibromyalgia    GERD  (gastroesophageal reflux disease)    Hallux abductovalgus with bunions 09/2014   right    Hammertoe 09/2014   right 4th, 5th   History of stomach ulcers    Hyperlipidemia    Hypertension    states is under control with med., has been on med. x 8 mos.   IBS (irritable bowel syndrome)    no current med.   Osteoarthritis    hands, bilateral hips   Peripheral vascular disease (HCC)    occasional swelling in both legs   Sinus headache    UTI (urinary tract infection) 05/21/2013    Past Surgical History:  Procedure Laterality Date   ABDOMINAL HYSTERECTOMY     ANTERIOR CERVICAL DECOMP/DISCECTOMY FUSION N/A 07/30/2020   Procedure: C5-6 ANTERIOR CERVICAL DECOMPRESSION/DISCECTOMY FUSION, ALLOGRAFT, PLATE;  Surgeon: Eldred Manges, MD;  Location: [facility];  Service: Orthopedics;  Laterality: N/A;   BREAST BIOPSY Left 04/2018   benign   BREAST BIOPSY Left 2016   benign   BUNIONECTOMY Right 10/04/2014   Procedure:  Serafina Royals, Donzetta Sprung;  Surgeon: Vivi Barrack, DPM;  Location:  SURGERY CENTER;  Service: Podiatry;  Laterality: Right;   COLONOSCOPY WITH PROPOFOL  02/13/2013   CYSTOSCOPY N/A 07/07/2017   Procedure: CYSTOSCOPY;  Surgeon: Aguilar Bing, MD;  Location: [facility];  Service: Gynecology;  Laterality: N/A;   DUPUYTREN / PALMAR FASCIOTOMY Right 07/18/2013   exc. 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   small finger   HAMMER TOE  SURGERY Right 10/04/2014   Procedure: HAMMER TOE REPAIR 4TH  AND 5TH  RIGHT FOOT  fourth toe fixation with k wire;  Surgeon: Vivi Barrack, DPM;  Location: [facility];  Service: Podiatry;  Laterality: Right;   LEG SURGERY Left    as a child; near amputation of leg   TUBAL LIGATION     VAGINAL HYSTERECTOMY N/A 07/07/2017   Procedure: HYSTERECTOMY VAGINAL;  Surgeon: [place] Bing, MD;  Location: [facility];  Service: Gynecology;  Laterality: N/A;    Family History  Problem Relation Age of  Onset   Hypertension Mother    Diabetes Mother    Heart disease Mother    Hypertension Father    Diabetes Father    Prostate cancer Father    Heart disease Father    Alcohol abuse Father    Hypertension Sister    Diabetes Sister    Heart disease Maternal Grandmother        Great GM   Breast cancer Maternal Grandmother    Bone cancer Maternal Grandmother    Breast cancer Maternal Aunt    Ovarian cancer Maternal Aunt    Rheum arthritis Sister    Colon cancer Other        great Aunt   Rectal cancer Neg Hx    Stomach cancer Neg Hx     Social History   Socioeconomic History   Marital status: Single    Spouse name: Not on file   Number of children: 3   Years of education: Not on file   Highest education level: 7th grade  Occupational History   Occupation: cook  Tobacco Use   Smoking status: Former    Current packs/day: 0.00    Types: Cigarettes, E-cigarettes    Start date: 08/24/1992    Quit date: 08/24/2012    Years since quitting: 10.5   Smokeless tobacco: Never  Vaping Use   Vaping status: Every Day   Substances: Flavoring  Substance and Sexual Activity   Alcohol use: Yes    Comment: occasionally   Drug use: Yes    Types: Marijuana    Comment: occ.   Sexual activity: Yes    Partners: Male    Birth control/protection: Surgical    Comment: 1 partner  Other Topics Concern   Not on file  Social History Narrative   Not on file   Social Determinants of Health   Financial Resource Strain: Medium Risk (02/24/2023)   Overall Financial Resource Strain (CARDIA)    Difficulty of Paying Living Expenses: Somewhat hard  Food Insecurity: Food Insecurity Present (02/24/2023)   Hunger Vital Sign    Worried About Running Out of Food in the Last Year: Sometimes true    Ran Out of Food in the Last Year: Sometimes true  Transportation Needs: Unmet Transportation Needs (02/24/2023)   PRAPARE - Administrator, Civil Service (Medical): Yes    Lack of Transportation  (Non-Medical): Yes  Physical Activity: Inactive (02/24/2023)   Exercise Vital Sign    Days of Exercise per Week: 0 days    Minutes of Exercise per Session: 0 min  Stress: No Stress Concern Present (02/24/2023)   [employer] of Occupational Health - Occupational Stress Questionnaire    Feeling of Stress : Only a little  Social Connections: Moderately Isolated (02/24/2023)   Social Connection and Isolation Panel [NHANES]    Frequency of Communication with Friends and Family: Twice a week    Frequency of Social Gatherings  with Friends and Family: Once a week    Attends Religious Services: More than 4 times per year    Active Member of Clubs or Organizations: No    Attends Banker Meetings: Never    Marital Status: Never married    No Known Allergies  Outpatient Medications Prior to Visit  Medication Sig Dispense Refill   clobetasol cream (TEMOVATE) 0.05 % Apply to affected areas on elbow and back of neck 2 (two) times daily. 60 g 1   diclofenac (VOLTAREN) 50 MG EC tablet Take 1 tablet (50 mg total) by mouth 2 (two) times daily after a meal as needed for pain 60 tablet 1   diphenhydrAMINE (BENADRYL) 25 MG tablet Take 1 tablet (25 mg total) by mouth daily. 30 tablet 0   estradiol (ESTRACE) 0.5 MG tablet Take 1 tablet (0.5 mg total) by mouth daily. 90 tablet 3   fluticasone (FLONASE) 50 MCG/ACT nasal spray PLACE 2 SPRAYS INTO BOTH NOSTRILS DAILY. 16 g 4   lidocaine (LIDODERM) 5 % Place 1 patch onto the skin daily. Remove & Discard patch within 12 hours or as directed by MD 30 patch 2   ondansetron (ZOFRAN-ODT) 8 MG disintegrating tablet Dissolve 1 tablet (8 mg total) by mouth daily as needed for nausea or vomiting. 30 tablet 0   traMADol (ULTRAM) 50 MG tablet Take 1 tablet (50 mg total) by mouth at bedtime as needed. 30 tablet 3   dicyclomine (BENTYL) 10 MG capsule Take 1 capsule (10 mg total) by mouth 2 (two) times daily as needed. 180 capsule 2   escitalopram (LEXAPRO) 20  MG tablet Take 1 tablet (20 mg total) by mouth daily. 90 tablet 1   gabapentin (NEURONTIN) 300 MG capsule Take 1 capsule (300 mg total) by mouth 3 (three) times daily. 270 capsule 1   metFORMIN (GLUCOPHAGE) 500 MG tablet Take 1 tablet (500 mg total) by mouth daily with breakfast. 90 tablet 1   omeprazole (PRILOSEC) 40 MG capsule Take 1 capsule (40 mg total) by mouth daily. 90 capsule 1   QUEtiapine (SEROQUEL) 100 MG tablet Take 1 tablet (100 mg total) by mouth at bedtime. 90 tablet 1   tiZANidine (ZANAFLEX) 4 MG tablet Take 1 tablet (4 mg total) by mouth every 8 (eight) hours as needed. 90 tablet 1   oxyCODONE (ROXICODONE) 5 MG immediate release tablet Take 1 tablet (5 mg total) by mouth every 6 (six) hours as needed for severe pain (pain score 7-10). (Patient not taking: Reported on 02/24/2023) 20 tablet 0   rosuvastatin (CRESTOR) 20 MG tablet TAKE 1 TABLET (20 MG TOTAL) BY MOUTH DAILY TO LOWER CHOLESTEROL (Patient not taking: Reported on 02/24/2023) 90 tablet 1   No facility-administered medications prior to visit.     ROS Review of Systems  Constitutional:  Negative for activity change and appetite change.  HENT:  Negative for sinus pressure and sore throat.   Respiratory:  Negative for chest tightness, shortness of breath and wheezing.   Cardiovascular:  Negative for chest pain and palpitations.  Gastrointestinal:  Negative for abdominal distention, abdominal pain and constipation.  Genitourinary: Negative.   Musculoskeletal:        See HPI  Psychiatric/Behavioral:  Negative for behavioral problems and dysphoric mood.     Objective:  BP 108/70   Pulse 70   Ht 5\' 11"  (1.803 m)   Wt 156 lb 3.2 oz (70.9 kg)   LMP 07/04/2017   SpO2 99%   BMI 21.79 kg/m  02/24/2023   11:20 AM 10/19/2022   10:26 AM 09/22/2022   10:41 AM  BP/Weight  Systolic BP 108 152 130  Diastolic BP 70 94 87  Wt. (Lbs) 156.2 152 152.8  BMI 21.79 kg/m2 21.2 kg/m2 21.31 kg/m2      Physical  Exam Constitutional:      Appearance: She is well-developed.  Cardiovascular:     Rate and Rhythm: Normal rate.     Heart sounds: Normal heart sounds. No murmur heard. Pulmonary:     Effort: Pulmonary effort is normal.     Breath sounds: Normal breath sounds. No wheezing or rales.  Chest:     Chest wall: No tenderness.  Abdominal:     General: Bowel sounds are normal. There is no distension.     Palpations: Abdomen is soft. There is no mass.     Tenderness: There is no abdominal tenderness.  Musculoskeletal:        General: Normal range of motion.     Right lower leg: No edema.     Left lower leg: No edema.     Comments: Left 5th finger amputation  Skin:    Comments: Large bruise on posterior right shoulder  Neurological:     Mental Status: She is alert and oriented to person, place, and time.  Psychiatric:        Mood and Affect: Mood normal.        Latest Ref Rng & Units 09/22/2022   11:44 AM 05/25/2021    9:26 AM 11/24/2020    9:15 AM  CMP  Glucose 70 - 99 mg/dL 161  096  95   BUN 6 - 24 mg/dL 4  9  12    Creatinine 0.57 - 1.00 mg/dL 0.45  4.09  8.11   Sodium 134 - 144 mmol/L 140  140  140   Potassium 3.5 - 5.2 mmol/L 4.0  4.4  4.0   Chloride 96 - 106 mmol/L 101  101  102   CO2 20 - 29 mmol/L 26  26  25    Calcium 8.7 - 10.2 mg/dL 9.5  9.8  9.5   Total Protein 6.0 - 8.5 g/dL 7.3   7.0   Total Bilirubin 0.0 - 1.2 mg/dL <9.1   0.4   Alkaline Phos 44 - 121 IU/L 78   63   AST 0 - 40 IU/L 18   18   ALT 0 - 32 IU/L 15   15     Lipid Panel     Component Value Date/Time   CHOL 202 (H) 09/22/2022 1144   TRIG 177 (H) 09/22/2022 1144   HDL 67 09/22/2022 1144   CHOLHDL 2.1 11/24/2020 0915   LDLCALC 105 (H) 09/22/2022 1144    CBC    Component Value Date/Time   WBC 7.0 09/22/2022 1144   WBC 6.7 07/30/2020 1007   RBC 4.68 09/22/2022 1144   RBC 4.58 07/30/2020 1007   HGB 14.1 09/22/2022 1144   HCT 43.1 09/22/2022 1144   PLT 271 09/22/2022 1144   MCV 92 09/22/2022 1144    MCH 30.1 09/22/2022 1144   MCH 31.0 07/30/2020 1007   MCHC 32.7 09/22/2022 1144   MCHC 33.5 07/30/2020 1007   RDW 12.9 09/22/2022 1144   LYMPHSABS 2.0 09/22/2022 1144   MONOABS 0.5 03/24/2020 1228   EOSABS 0.6 (H) 09/22/2022 1144   BASOSABS 0.1 09/22/2022 1144    Lab Results  Component Value Date   HGBA1C 5.8 (H) 09/22/2022  Assessment & Plan:      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 Experiencing phantom pain managed with Tylenol and Tramadol. -Continue Tylenol as needed for pain. -Discuss pain management with hand specialist at follow-up visit on Tuesday. -Consider referral to pain management if pain persists and requires frequent Tramadol use.   Depression Stable on Lexapro. Considered switch to Cymbalta for potential benefit on phantom pain, but patient had prior adverse reaction. -Continue Lexapro.  Irritable Bowel Syndrome Stable on Dicyclomine. -Continue Dicyclomine daily.  Menopause -Status post abdominal hysterectomy -Stable on Estrace. -Continue Estrace daily.  Back Pain Recent fall exacerbating chronic back pain. Applying ice and taking Tramadol as needed. -Continue current management and monitor.  General Health Maintenance -Order eye exam for difficulty reading. -Order dental visit for cleaning and filling repair. -Administer flu and shingles vaccines today. -Order blood work at 95-month follow-up visit.          Meds ordered this encounter  Medications   dicyclomine (BENTYL) 10 MG capsule    Sig: Take 1 capsule (10 mg total) by mouth 2 (two) times daily as needed.    Dispense:  180 capsule    Refill:  2   escitalopram (LEXAPRO) 20 MG tablet    Sig: Take 1 tablet (20 mg total) by mouth daily.    Dispense:  90 tablet    Refill:  1   gabapentin (NEURONTIN) 300 MG capsule    Sig: Take 1 capsule (300 mg total) by mouth 3 (three) times daily.    Dispense:  270 capsule     Refill:  1   metFORMIN (GLUCOPHAGE) 500 MG tablet    Sig: Take 1 tablet (500 mg total) by mouth daily with breakfast.    Dispense:  90 tablet    Refill:  1   omeprazole (PRILOSEC) 40 MG capsule    Sig: Take 1 capsule (40 mg total) by mouth daily.    Dispense:  90 capsule    Refill:  1   QUEtiapine (SEROQUEL) 100 MG tablet    Sig: Take 1 tablet (100 mg total) by mouth at bedtime.    Dispense:  90 tablet    Refill:  1   rosuvastatin (CRESTOR) 20 MG tablet    Sig: TAKE 1 TABLET (20 MG TOTAL) BY MOUTH DAILY TO LOWER CHOLESTEROL    Dispense:  90 tablet    Refill:  1   tiZANidine (ZANAFLEX) 4 MG tablet    Sig: Take 1 tablet (4 mg total) by mouth every 8 (eight) hours as needed.    Dispense:  90 tablet    Refill:  1    Follow-up: Return in about 6 months (around 08/24/2023) for Chronic medical conditions.       Hoy Register, MD, FAAFP. [facility] [place], [place] [phone]   02/24/2023, 12:38 PM

## 2023-02-24 NOTE — Patient Instructions (Signed)
Phantom Limb Pain Phantom limb pain is pain in a body part that no longer exists. It usually happens in an arm or leg after it has been surgically removed (amputated). Most cases of phantom limb pain are brief. However, it can last for years, and it may be severe and disabling. The exact mechanism of how phantom limb pain occurs is not known. The problem may start in a part of the brain that processes feelings and awareness (sensations) from the rest of the body (sensory cortex). When a body part is lost, the sensory cortex may not be able to handle the loss and may reorganize (rewire) itself to make up for the lost signals. What are the causes? This condition only happens in patients with amputations, but the cause is not known. It may be caused by: Damaged nerve endings. Scar tissue. Rewiring of nerves in the brain or spine. What are the signs or symptoms? Symptoms vary and are related to the lost limb or the remaining stump. Stump pain may be mistaken for phantom limb pain, or you may have both at the same time. Symptoms include: Phantom pain. Pain often feels like the pain you had before the amputation. It usually comes and goes, and it gets better over time. The pain may feel like: Burning. Stabbing. Throbbing. Cramping. Prickling. Crushing. Telescoping. This is when the pain moves over time from the farthest part of the amputated limb (fingers or toes) up to the site of amputation, as if the limb is shrinking. Phantom sensation. This is a feeling other than pain, as if the limb is still part of the body. Physical or emotional factors can trigger or worsen pain sensations. Those factors may include: Weather changes. Stress. Strong emotions. Certain positions or movements of the body. Pressure on the affected area. How is this diagnosed? Diagnosis is based on your history of amputation and symptoms that you have after surgery. Your health care provider may: Do a physical exam. Talk  with you about your symptoms and past history of pain. You may have imaging tests to examine your stump, such as X-rays or CT scan. How is this treated? There are different therapies and medicines that may give you relief. Treatment options may include: Pain medicine. Medicine can be given for pain right after surgery (acute pain) and for pain that goes on for some time (chronic pain). Commonly used medicines include: Antidepressant medicine. Anticonvulsant medicine. Narcotics, analgesics, or anti-inflammatory medicine. Nerve blocks. Techniques that help to retrain the brain and nervous system (movement representation techniques), such as: Looking at your unaffected limb in a mirror and thinking about painless movement of your extremity (mirror therapy). Thinking about moving your limbs without actual movement (motor therapy). Watching and sensing the movement of other people (action observation). Attaching a myoelectric sensor on the stump to detect the muscle potential and predict the motion the person wants to perform (virtual reality). Sensory discrimination training. For this treatment, painless stimulation is applied to different parts of your stump and you describe what you feel. This may help with nerve rewiring. Physical therapy involving the stump, which may include: Exercise. This may be physical movement, or it may involve applying sound waves (ultrasound) or tapping (percussion therapy) to the stump. These exercises may help to heal and retrain tissue and nerves. Massage. Stump massage creates new sensations, breaks up scar tissue, and prepares the stump for an artificial limb (prosthesis). Heat or cold treatment. This can improve blood flow and reduce inflammation. Applying painless electrical  pulses to the skin to prevent sensations of pain from reaching the brain (transcutaneous electrical nerve stimulation, TENS). Complementary therapies, such as: Acupuncture. Relaxation  techniques. These often involve hypnosis, guided imagery, deep breathing, and muscle relaxation exercises. Biofeedback. This involves using monitors that alert you to changes in your breathing, heart rate, skin temperature, or muscle activity, and using relaxation techniques to reverse those changes. Biofeedback tells you if the techniques you are using are effective. Follow these instructions at home:  Take over-the-counter and prescription medicines only as told by your health care provider. Ask your health care provider if the medicine prescribed to you requires you to avoid driving or using machinery. Do not use any products that contain nicotine or tobacco. These products include cigarettes, chewing tobacco, and vaping devices, such as e-cigarettes. If you need help quitting, ask your health care provider. Join a support group. Express your feelings and talk with someone you trust. Seek counseling or talk therapy with a mental health professional. This may be helpful if you are having trouble managing your emotions about the amputation. Keep all follow-up visits. This is important. Contact a health care provider if: You have a sore on your stump that does not get better with treatment. Your pain does not improve with medicine or treatment. Get help right away if: You have suicidal thoughts. If you ever feel like you may hurt yourself or others, or have thoughts about taking your own life, get help right away. Go to your nearest emergency department or: Call your local emergency services (911 in the [place] Call a suicide crisis helpline, such as the National Suicide Prevention Lifeline at 214-483-3221 or 988 in the [place] This is open 24 hours a day in the [place] Text the Crisis Text Line at 720 160 8697 (in the [place] Summary Phantom limb pain is pain in a body part that no longer exists. It happens in an arm or leg (extremity) after it has been surgically removed (amputated). Medicines or  techniques that help to retrain the brain and nervous system (movement representation techniques) may help to relieve symptoms. Physical therapy for phantom limb pain may involve exercise, massage, heat or cold therapy, or painless stimulation of the skin (transcutaneous electrical nerve stimulation, TENS). This information is not intended to replace advice given to you by your health care provider. Make sure you discuss any questions you have with your health care provider. Document Revised: 10/29/2020 Document Reviewed: 08/21/2020 Elsevier Patient Education  2024 [employer].

## 2023-02-25 ENCOUNTER — Telehealth: Payer: Self-pay | Admitting: Pharmacist

## 2023-02-25 ENCOUNTER — Other Ambulatory Visit: Payer: Self-pay | Admitting: Critical Care Medicine

## 2023-02-25 ENCOUNTER — Other Ambulatory Visit: Payer: Self-pay

## 2023-02-25 DIAGNOSIS — Z780 Asymptomatic menopausal state: Secondary | ICD-10-CM

## 2023-02-25 NOTE — Telephone Encounter (Signed)
Insurance is new and prefers meloxicam. Pt currently has a rxn for diclofenac which is not covered. Are we okay to change?

## 2023-02-27 ENCOUNTER — Other Ambulatory Visit: Payer: Self-pay | Admitting: Family Medicine

## 2023-02-27 DIAGNOSIS — R110 Nausea: Secondary | ICD-10-CM

## 2023-02-28 ENCOUNTER — Ambulatory Visit (INDEPENDENT_AMBULATORY_CARE_PROVIDER_SITE_OTHER): Payer: Medicaid Other | Admitting: Orthopedic Surgery

## 2023-02-28 ENCOUNTER — Other Ambulatory Visit: Payer: Self-pay

## 2023-02-28 DIAGNOSIS — M720 Palmar fascial fibromatosis [Dupuytren]: Secondary | ICD-10-CM | POA: Diagnosis not present

## 2023-02-28 MED ORDER — MELOXICAM 7.5 MG PO TABS
7.5000 mg | ORAL_TABLET | Freq: Every day | ORAL | 1 refills | Status: DC
  Filled 2023-02-28: qty 30, 30d supply, fill #0
  Filled 2023-04-07: qty 30, 30d supply, fill #1

## 2023-02-28 MED ORDER — ESTRADIOL 0.5 MG PO TABS
0.5000 mg | ORAL_TABLET | Freq: Every day | ORAL | 3 refills | Status: DC
Start: 2023-02-28 — End: 2023-11-17
  Filled 2023-02-28: qty 90, 90d supply, fill #0
  Filled 2023-04-26 – 2023-06-03 (×2): qty 90, 90d supply, fill #1
  Filled 2023-08-02 – 2023-08-30 (×2): qty 90, 90d supply, fill #2
  Filled 2023-10-25: qty 90, 90d supply, fill #3

## 2023-02-28 NOTE — Progress Notes (Signed)
   Khandi Demauro - 52 y.o. female MRN [id number]  Date of birth: [birth year]  Office Visit Note: Visit Date: 02/28/2023 PCP: Hoy Register, MD Referred by: Hoy Register, MD  Subjective:  HPI: Brynn Gair is a 51 y.o. female who presents today for follow up 2 weeks status post MP disarticulation, partial amputation of left small finger.  She is doing well overall, pain is well-controlled.  Pertinent ROS were reviewed with the patient and found to be negative unless otherwise specified above in HPI.   Assessment & Plan: Visit Diagnoses: No diagnosis found.  Plan: She has demonstrated very good healing.  Sutures removed today.  At this juncture she is cleared for activities as tolerated moving forward.  We did discuss the possibility of phantom pain at the amputation site.  I did also mention mirror therapy to her should this continue.  She expressed understanding, return as needed.  Follow-up: No follow-ups on file.   Meds & Orders: No orders of the defined types were placed in this encounter.  No orders of the defined types were placed in this encounter.    Procedures: No procedures performed       Objective:   Vital Signs: LMP 07/04/2017   Ortho Exam Left hand with well-healed MP disarticulation site, skin edges well-approximated without erythema or drainage.  Sutures removed today without incident.  Imaging: No results found.   Lailyn Appelbaum Trevor Mace, M.D. [facility] 10:50 AM

## 2023-02-28 NOTE — Addendum Note (Signed)
Addended by: Hoy Register on: 02/28/2023 05:02 PM   Modules accepted: Orders

## 2023-02-28 NOTE — Telephone Encounter (Signed)
I have changed to meloxicam.

## 2023-03-01 ENCOUNTER — Ambulatory Visit: Payer: Medicaid Other | Admitting: Orthopaedic Surgery

## 2023-03-01 ENCOUNTER — Other Ambulatory Visit: Payer: Self-pay

## 2023-03-01 MED ORDER — ONDANSETRON 8 MG PO TBDP
8.0000 mg | ORAL_TABLET | Freq: Every day | ORAL | 0 refills | Status: DC | PRN
  Filled 2023-03-01: qty 30, 30d supply, fill #0

## 2023-03-01 NOTE — Telephone Encounter (Signed)
Requested medication (s) are due for refill today: yes  Requested medication (s) are on the active medication list: yes  Last refill:  01/11/23  Future visit scheduled: no  Notes to clinic:  Unable to refill per protocol, cannot delegate.      Requested Prescriptions  Pending Prescriptions Disp Refills   ondansetron (ZOFRAN-ODT) 8 MG disintegrating tablet 30 tablet 0    Sig: Dissolve 1 tablet (8 mg total) by mouth daily as needed for nausea or vomiting.     Not Delegated - Gastroenterology: Antiemetics - ondansetron Failed - 02/27/2023  1:13 PM      Failed - This refill cannot be delegated      Passed - AST in normal range and within 360 days    AST  Date Value Ref Range Status  09/22/2022 18 0 - 40 IU/L Final         Passed - ALT in normal range and within 360 days    ALT  Date Value Ref Range Status  09/22/2022 15 0 - 32 IU/L Final         Passed - Valid encounter within last 6 months    Recent Outpatient Visits           5 days ago Wears dentures   [facility] [place]. [facility] Hoy Register, MD   5 months ago Dupuytren's contracture of both hands   [facility] [facility] [place] [facility] [place]. [facility] Hoy Register, MD   1 year ago Essential hypertension   [facility] [facility] [facility] [place] [facility] [place]. [facility] Storm Frisk, MD   1 year ago Pre-diabetes   [facility] [place] [facility] Of [place]. [facility] Hoy Register, MD   2 years ago Vasomotor symptoms due to menopause    [facility] [place] [facility] [place]. [facility] Hoy Register, MD       Future Appointments             In 1 week Eldred Manges, MD [facility]

## 2023-03-02 DIAGNOSIS — Z1231 Encounter for screening mammogram for malignant neoplasm of breast: Secondary | ICD-10-CM

## 2023-03-08 ENCOUNTER — Other Ambulatory Visit (INDEPENDENT_AMBULATORY_CARE_PROVIDER_SITE_OTHER): Payer: Medicaid Other

## 2023-03-08 ENCOUNTER — Ambulatory Visit (INDEPENDENT_AMBULATORY_CARE_PROVIDER_SITE_OTHER): Payer: Medicaid Other | Admitting: Orthopaedic Surgery

## 2023-03-08 ENCOUNTER — Encounter: Payer: Self-pay | Admitting: Orthopaedic Surgery

## 2023-03-08 VITALS — BP 112/77 | HR 90 | Ht 71.0 in | Wt 156.0 lb

## 2023-03-08 DIAGNOSIS — M7062 Trochanteric bursitis, left hip: Secondary | ICD-10-CM | POA: Insufficient documentation

## 2023-03-08 DIAGNOSIS — M545 Low back pain, unspecified: Secondary | ICD-10-CM

## 2023-03-08 DIAGNOSIS — M549 Dorsalgia, unspecified: Secondary | ICD-10-CM

## 2023-03-08 MED ORDER — LIDOCAINE HCL 1 % IJ SOLN
1.0000 mL | INTRAMUSCULAR | Status: AC | PRN
  Administered 2023-03-08: 1 mL

## 2023-03-08 MED ORDER — BUPIVACAINE HCL 0.25 % IJ SOLN
2.0000 mL | INTRAMUSCULAR | Status: AC | PRN
  Administered 2023-03-08: 2 mL via INTRA_ARTICULAR

## 2023-03-08 MED ORDER — METHYLPREDNISOLONE ACETATE 40 MG/ML IJ SUSP
40.0000 mg | INTRAMUSCULAR | Status: AC | PRN
  Administered 2023-03-08: 40 mg via INTRA_ARTICULAR

## 2023-03-08 NOTE — Progress Notes (Signed)
Office Visit Note   Patient: Isabel Evans           Date of Birth: 1970/06/20           MRN: [id number] Visit Date: 03/08/2023              Requested by: Hoy Register, MD [address] [place] [address] [place],  [place] [address] PCP: Hoy Register, MD   Assessment & Plan: Visit Diagnoses:  1. Mid back pain   2. Pain of lumbar spine   3. Trochanteric bursitis, left hip     Plan: Pathophysiology discussed she has some disc space narrowing at L4-5 worse on the left than right with endplate spurring and facet arthropathy and secondary left trochanteric bursitis.  Injection performed hopefully should get some relief otherwise we can consider repeat imaging studies of her lumbar spine.  Follow-Up Instructions: No follow-ups on file.   Orders:  Orders Placed This Encounter  Procedures   XR Lumbar Spine 2-3 Views   XR Thoracic Spine 2 View   No orders of the defined types were placed in this encounter.     Procedures: Large Joint Inj: L greater trochanter on 03/08/2023 12:56 PM Details: lateral approach Medications: 1 mL lidocaine 1 %; 2 mL bupivacaine 0.25 %; 40 mg methylPREDNISolone acetate 40 MG/ML      Clinical Data: No additional findings.   Subjective: Chief Complaint  Patient presents with   Middle Back - Pain   Lower Back - Pain    HPI patient returns with ongoing problems over the lumbar spine more pain laterally over the buttocks and over the trochanter.  She states she fell when Rocco Serene broke 2 years ago and she landed with her legs flipping up overhead.  She has had discomfort with prolonged sitting or standing can get comfortable.  She is use Tylenol and Goody's powder.  Review of Systems all systems updated unchanged.   Objective: Vital Signs: BP 112/77   Pulse 90   Ht 5\' 11"  (1.803 m)   Wt 156 lb (70.8 kg)   LMP 07/04/2017   BMI 21.76 kg/m   Physical Exam Constitutional:      Appearance: She is well-developed.  HENT:     Head:  Normocephalic.     Right Ear: External ear normal.     Left Ear: External ear normal. There is no impacted cerumen.  Eyes:     Pupils: Pupils are equal, round, and reactive to light.  Neck:     Thyroid: No thyromegaly.     Trachea: No tracheal deviation.  Cardiovascular:     Rate and Rhythm: Normal rate.  Pulmonary:     Effort: Pulmonary effort is normal.  Abdominal:     Palpations: Abdomen is soft.  Musculoskeletal:     Cervical back: No rigidity.  Skin:    General: Skin is warm and dry.  Neurological:     Mental Status: She is alert and oriented to person, place, and time.  Psychiatric:        Behavior: Behavior normal.     Ortho Exam Exquisite tenderness over the left troches some sciatic notch tenderness some pain with straight leg raising at 80 degrees on the left negative on the right.  No restriction of internal/external rotation of her hip joint.  Pulses are normal.  Specialty Comments:  MRI LUMBAR SPINE WITHOUT CONTRAST   TECHNIQUE: Multiplanar, multisequence MR imaging of the lumbar spine was performed. No intravenous contrast was administered.  COMPARISON:  03/15/2018   FINDINGS: Segmentation:  Standard.   Alignment:  Physiologic.   Vertebrae: No acute fracture, evidence of discitis, or aggressive bone lesion.   Conus medullaris and cauda equina: Conus extends to the T12 level. Conus and cauda equina appear normal.   Paraspinal and other soft tissues: No acute paraspinal abnormality.   Disc levels:   Disc spaces: Disc spaces are maintained.   T12-L1: No significant disc bulge. No neural foraminal stenosis. No central canal stenosis.   L1-L2: No significant disc bulge. No neural foraminal stenosis. No central canal stenosis.   L2-L3: Mild broad-based disc bulge with a broad right paracentral disc protrusion. Right subarticular recess and foraminal narrowing. No left foraminal stenosis. No spinal stenosis.   L3-L4: No significant disc bulge. No  neural foraminal stenosis. No central canal stenosis.   L4-L5: Broad-based disc bulge with a broad shallow left foraminal disc protrusion. Mild bilateral foraminal narrowing. No spinal stenosis.   L5-S1: Broad shallow left foraminal disc protrusion contacting the left L5 nerve root. Moderate left foraminal stenosis. No right foraminal stenosis. No spinal stenosis.   IMPRESSION: 1. At L5-S1 there is a broad shallow left foraminal disc protrusion contacting the left L5 nerve root. Moderate left foraminal stenosis. 2. At L4-5 there is a broad-based disc bulge with a broad shallow left foraminal disc protrusion. Mild bilateral foraminal narrowing. 3. At L2-3 there is a mild broad-based disc bulge with a broad right paracentral disc protrusion. Right subarticular recess and foraminal narrowing. 4.  No acute osseous injury of the lumbar spine.     Electronically Signed   By: Elige Ko M.D.   On: 06/21/2021 13:52  Imaging: XR Lumbar Spine 2-3 Views  Result Date: 03/08/2023 AP lateral lumbar spine images are obtained and reviewed.  This shows some L4-5 endplate narrowing more on the left than right side on AP image.  No spondylolisthesis hips and pelvis are normal.  Comparison 05/21/2021 radiographs. Impression: L4-5 disc degeneration with mild narrowing.  XR Thoracic Spine 2 View  Result Date: 03/08/2023 AP lateral thoracic spine shows normal alignment no curvature no degenerative changes. Impression: Normal thoracic spine radiographs.    PMFS History: Patient Active Problem List   Diagnosis Date Noted   Trochanteric bursitis, left hip 03/08/2023   Skin rash 01/26/2022   Family history of cancer 04/08/2021   Menopause 04/08/2021   S/P cervical spinal fusion 09/16/2020   Cervical spinal stenosis 07/30/2020   Other spondylosis with radiculopathy, cervical region 06/18/2020   Major depressive disorder, recurrent episode, moderate (HCC) 05/26/2020   Protrusion of cervical  intervertebral disc 03/22/2018   Anemia 11/24/2017   Pre-diabetes 11/24/2017   Blurry vision, bilateral 05/18/2017   Callus of foot 05/18/2017   Pain in joint of left hip 02/16/2017   Bruises easily 11/10/2016   Gastroesophageal reflux disease without esophagitis 09/22/2016   Fatigue associated with anemia 09/22/2016   Flexion contractures 09/22/2016   Generalized anxiety disorder 01/01/2016   Healthcare maintenance 05/08/2015   Stress at home 10/24/2014   Irritable bowel syndrome 10/24/2014   Anxiety state 10/24/2014   Pap smear for cervical cancer screening 09/19/2014   Midline low back pain without sciatica 08/12/2014   [place] nodes 08/12/2014   Contracture of joint, hand 08/12/2014   Essential hypertension 02/11/2014   Allergy 02/11/2014   Screening for breast cancer 02/11/2014   Dupuytren's contracture of both hands 11/15/2013   Hallux valgus of right foot 11/15/2013   Back pain 05/21/2013   Multiple skin  nodules 11/27/2012   Fibromyalgia 09/08/2012   Osteoarthritis 09/08/2012   Neck muscle spasm 07/14/2012   Plantar wart 07/14/2012   Insomnia 07/14/2012   Past Medical History:  Diagnosis Date   Anemia 11/24/2017   Anxiety    Bone spur    cervical spine   Chronic kidney disease 04/2017   kidney infections   Depression    Deviated nasal septum    states is unable to lie flat, because her nose will become congested and she can stop breathing   Diabetes mellitus without complication (HCC)    Dysrhythmia    heart flutters occasionally   Fibromyalgia    Fibromyalgia    GERD (gastroesophageal reflux disease)    Hallux abductovalgus with bunions 09/2014   right    Hammertoe 09/2014   right 4th, 5th   History of stomach ulcers    Hyperlipidemia    Hypertension    states is under control with med., has been on med. x 8 mos.   IBS (irritable bowel syndrome)    no current med.   Osteoarthritis    hands, bilateral hips   Peripheral vascular disease (HCC)     occasional swelling in both legs   Sinus headache    UTI (urinary tract infection) 05/21/2013    Family History  Problem Relation Age of Onset   Hypertension Mother    Diabetes Mother    Heart disease Mother    Hypertension Father    Diabetes Father    Prostate cancer Father    Heart disease Father    Alcohol abuse Father    Hypertension Sister    Diabetes Sister    Heart disease Maternal Grandmother        Great GM   Breast cancer Maternal Grandmother    Bone cancer Maternal Grandmother    Breast cancer Maternal Aunt    Ovarian cancer Maternal Aunt    Rheum arthritis Sister    Colon cancer Other        great Aunt   Rectal cancer Neg Hx    Stomach cancer Neg Hx     Past Surgical History:  Procedure Laterality Date   ABDOMINAL HYSTERECTOMY     ANTERIOR CERVICAL DECOMP/DISCECTOMY FUSION N/A 07/30/2020   Procedure: C5-6 ANTERIOR CERVICAL DECOMPRESSION/DISCECTOMY FUSION, ALLOGRAFT, PLATE;  Surgeon: Eldred Manges, MD;  Location: [facility];  Service: Orthopedics;  Laterality: N/A;   BREAST BIOPSY Left 04/2018   benign   BREAST BIOPSY Left 2016   benign   BUNIONECTOMY Right 10/04/2014   Procedure:  Serafina Royals, Donzetta Sprung;  Surgeon: Vivi Barrack, DPM;  Location: [facility];  Service: Podiatry;  Laterality: Right;   COLONOSCOPY WITH PROPOFOL  02/13/2013   CYSTOSCOPY N/A 07/07/2017   Procedure: CYSTOSCOPY;  Surgeon: Rand Bing, MD;  Location: [facility];  Service: Gynecology;  Laterality: N/A;   DUPUYTREN / PALMAR FASCIOTOMY Right 07/18/2013   exc. 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   small finger   HAMMER TOE SURGERY Right 10/04/2014   Procedure: HAMMER TOE REPAIR 4TH  AND 5TH  RIGHT FOOT  fourth toe fixation with k wire;  Surgeon: Vivi Barrack, DPM;  Location: [facility];  Service: Podiatry;  Laterality: Right;   LEG SURGERY Left    as a child; near amputation of leg   TUBAL LIGATION      VAGINAL HYSTERECTOMY N/A 07/07/2017   Procedure: HYSTERECTOMY VAGINAL;  Surgeon: [place] Bing, MD;  Location: [facility];  Service: Gynecology;  Laterality: N/A;   Social History   Occupational History   Occupation: cook  Tobacco Use   Smoking status: Former    Current packs/day: 0.00    Types: Cigarettes, E-cigarettes    Start date: 08/24/1992    Quit date: 08/24/2012    Years since quitting: 10.5   Smokeless tobacco: Never  Vaping Use   Vaping status: Every Day   Substances: Flavoring  Substance and Sexual Activity   Alcohol use: Yes    Comment: occasionally   Drug use: Yes    Types: Marijuana    Comment: occ.   Sexual activity: Yes    Partners: Male    Birth control/protection: Surgical    Comment: 1 partner

## 2023-03-09 ENCOUNTER — Other Ambulatory Visit: Payer: Self-pay

## 2023-03-18 ENCOUNTER — Other Ambulatory Visit: Payer: Self-pay

## 2023-03-25 ENCOUNTER — Other Ambulatory Visit: Payer: Medicaid Other

## 2023-04-07 ENCOUNTER — Other Ambulatory Visit: Payer: Self-pay

## 2023-04-14 ENCOUNTER — Other Ambulatory Visit: Payer: Self-pay

## 2023-04-18 ENCOUNTER — Other Ambulatory Visit: Payer: Self-pay

## 2023-04-19 ENCOUNTER — Other Ambulatory Visit: Payer: Self-pay

## 2023-04-25 DIAGNOSIS — Z1231 Encounter for screening mammogram for malignant neoplasm of breast: Secondary | ICD-10-CM

## 2023-04-26 ENCOUNTER — Other Ambulatory Visit: Payer: Self-pay | Admitting: Family Medicine

## 2023-04-26 ENCOUNTER — Other Ambulatory Visit: Payer: Self-pay | Admitting: Orthopedic Surgery

## 2023-04-26 ENCOUNTER — Other Ambulatory Visit: Payer: Self-pay

## 2023-04-26 ENCOUNTER — Other Ambulatory Visit: Payer: Self-pay | Admitting: Critical Care Medicine

## 2023-04-26 DIAGNOSIS — R110 Nausea: Secondary | ICD-10-CM

## 2023-04-26 DIAGNOSIS — F411 Generalized anxiety disorder: Secondary | ICD-10-CM

## 2023-04-26 DIAGNOSIS — M545 Low back pain, unspecified: Secondary | ICD-10-CM

## 2023-04-27 ENCOUNTER — Other Ambulatory Visit: Payer: Self-pay

## 2023-04-27 MED ORDER — ONDANSETRON 8 MG PO TBDP
8.0000 mg | ORAL_TABLET | Freq: Every day | ORAL | 2 refills | Status: DC | PRN
  Filled 2023-04-27: qty 30, 30d supply, fill #0

## 2023-04-27 MED ORDER — CLOBETASOL PROPIONATE 0.05 % EX CREA
1.0000 | TOPICAL_CREAM | Freq: Two times a day (BID) | CUTANEOUS | 1 refills | Status: DC
  Filled 2023-04-27: qty 60, 14d supply, fill #0
  Filled 2023-06-03: qty 60, 14d supply, fill #1

## 2023-04-27 MED ORDER — FLUTICASONE PROPIONATE 50 MCG/ACT NA SUSP
2.0000 | Freq: Every day | NASAL | 2 refills | Status: DC
  Filled 2023-04-27 – 2023-05-02 (×3): qty 16, 30d supply, fill #0
  Filled 2023-06-03: qty 16, 30d supply, fill #1
  Filled 2023-07-01: qty 16, 30d supply, fill #2

## 2023-04-27 MED ORDER — TRAMADOL HCL 50 MG PO TABS
50.0000 mg | ORAL_TABLET | Freq: Every evening | ORAL | 3 refills | Status: DC | PRN
  Filled 2023-04-27: qty 30, 30d supply, fill #0
  Filled 2023-06-03: qty 30, 30d supply, fill #1
  Filled 2023-07-01 – 2023-07-31 (×3): qty 30, 30d supply, fill #2

## 2023-04-27 MED ORDER — MELOXICAM 7.5 MG PO TABS
7.5000 mg | ORAL_TABLET | Freq: Every day | ORAL | 2 refills | Status: DC
  Filled 2023-04-27 – 2023-05-02 (×3): qty 30, 30d supply, fill #0
  Filled 2023-06-03: qty 30, 30d supply, fill #1
  Filled 2023-07-01: qty 30, 30d supply, fill #2

## 2023-04-27 NOTE — Telephone Encounter (Signed)
 Requested medication (s) are due for refill today - expired Rx  Requested medication (s) are on the active medication list -yes  Future visit scheduled -no  Last refill: 01/26/22 60g 1RF  Notes to clinic: non delegated Rx  Requested Prescriptions  Pending Prescriptions Disp Refills   clobetasol  cream (TEMOVATE ) 0.05 % 60 g 1    Sig: Apply to affected areas on elbow and back of neck 2 (two) times daily.     Not Delegated - Dermatology:  Corticosteroids Failed - 04/27/2023  8:49 AM      Failed - This refill cannot be delegated      Passed - Valid encounter within last 12 months    Recent Outpatient Visits           2 months ago Wears dentures   [facility] [place]. [facility] Delbert Clam, MD   7 months ago Dupuytren's contracture of both hands   [facility] [place] [facility] [place]. [facility] Delbert Clam, MD   1 year ago Essential hypertension   [facility] [place] [facility] [place]. [facility] Brien Belvie BRAVO, MD   1 year ago Pre-diabetes   [facility] [facility] [place] [facility] [place]. [facility] Delbert Clam, MD   2 years ago Vasomotor symptoms due to menopause   [facility] [facility] [place] [facility] [place]. [facility] Delbert Clam, MD                 Requested Prescriptions  Pending Prescriptions Disp Refills   clobetasol  cream (TEMOVATE ) 0.05 % 60 g 1    Sig: Apply to affected areas on elbow and back of neck 2 (two) times daily.     Not Delegated - Dermatology:  Corticosteroids Failed - 04/27/2023  8:49 AM      Failed - This refill cannot be delegated      Passed - Valid encounter within last 12 months    Recent Outpatient Visits           2 months ago Wears dentures   [facility] [facility] [place] [facility] [place]. [facility] Delbert Clam, MD   7 months ago Dupuytren's contracture of both  hands   [place] [facility] [place] [facility] [place]. [facility] Delbert Clam, MD   1 year ago Essential hypertension   [place] [facility] [place] [facility] Of [place]. [facility] Brien Belvie BRAVO, MD   1 year ago Pre-diabetes   [place] [facility] [place] [facility] Of [place]. [facility] Delbert Clam, MD   2 years ago Vasomotor symptoms due to menopause   [place] [facility] [place] [facility] Of . [facility] Delbert Clam, MD

## 2023-04-28 ENCOUNTER — Other Ambulatory Visit: Payer: Self-pay

## 2023-04-29 ENCOUNTER — Other Ambulatory Visit: Payer: Self-pay

## 2023-05-03 ENCOUNTER — Other Ambulatory Visit: Payer: Self-pay

## 2023-05-04 ENCOUNTER — Other Ambulatory Visit: Payer: Self-pay

## 2023-05-11 ENCOUNTER — Other Ambulatory Visit: Payer: Medicaid Other

## 2023-06-03 ENCOUNTER — Other Ambulatory Visit: Payer: Self-pay | Admitting: Family Medicine

## 2023-06-03 DIAGNOSIS — R52 Pain, unspecified: Secondary | ICD-10-CM

## 2023-06-03 DIAGNOSIS — M545 Low back pain, unspecified: Secondary | ICD-10-CM

## 2023-06-06 ENCOUNTER — Other Ambulatory Visit: Payer: Self-pay

## 2023-06-06 MED ORDER — TIZANIDINE HCL 4 MG PO TABS
4.0000 mg | ORAL_TABLET | Freq: Three times a day (TID) | ORAL | 0 refills | Status: DC | PRN
  Filled 2023-06-06: qty 90, 30d supply, fill #0

## 2023-06-06 NOTE — Telephone Encounter (Signed)
Requested medication (s) are due for refill today: yes  Requested medication (s) are on the active medication list: yes  Last refill:  02/24/23 #90 1 refills  Future visit scheduled: no  Notes to clinic:  not delegated per protocol do you want to refill Rx?     Requested Prescriptions  Pending Prescriptions Disp Refills   tiZANidine (ZANAFLEX) 4 MG tablet 90 tablet 1    Sig: Take 1 tablet (4 mg total) by mouth every 8 (eight) hours as needed.     Not Delegated - Cardiovascular:  Alpha-2 Agonists - tizanidine Failed - 06/06/2023 10:57 AM      Failed - This refill cannot be delegated      Passed - Valid encounter within last 6 months    Recent Outpatient Visits           3 months ago Wears dentures   [facility] [place]. [facility] Hoy Register, MD   8 months ago Dupuytren's contracture of both hands   [facility] Of [place]. [facility] Hoy Register, MD   1 year ago Essential hypertension   [facility] - [facility] Dept Of [place]. [facility] Storm Frisk, MD   2 years ago Pre-diabetes   [facility] Of [place]. [facility] Hoy Register, MD   2 years ago Vasomotor symptoms due to menopause   [facility] Of . [facility] Hoy Register, MD

## 2023-06-08 ENCOUNTER — Other Ambulatory Visit: Payer: Self-pay

## 2023-06-27 ENCOUNTER — Encounter (HOSPITAL_COMMUNITY): Payer: Self-pay | Admitting: *Deleted

## 2023-06-27 ENCOUNTER — Other Ambulatory Visit: Payer: Self-pay

## 2023-06-27 ENCOUNTER — Emergency Department (HOSPITAL_COMMUNITY)
Admission: EM | Admit: 2023-06-27 | Discharge: 2023-06-28 | Disposition: A | Discharge status: Home / Self Care | Attending: Emergency Medicine | Admitting: Emergency Medicine

## 2023-06-27 ENCOUNTER — Emergency Department (HOSPITAL_COMMUNITY)

## 2023-06-27 ENCOUNTER — Ambulatory Visit
Admission: EM | Admit: 2023-06-27 | Discharge: 2023-06-27 | Disposition: A | Discharge status: Home / Self Care | Attending: Family Medicine | Admitting: Family Medicine

## 2023-06-27 DIAGNOSIS — R112 Nausea with vomiting, unspecified: Secondary | ICD-10-CM | POA: Insufficient documentation

## 2023-06-27 DIAGNOSIS — Z7984 Long term (current) use of oral hypoglycemic drugs: Secondary | ICD-10-CM | POA: Insufficient documentation

## 2023-06-27 DIAGNOSIS — R1031 Right lower quadrant pain: Secondary | ICD-10-CM

## 2023-06-27 DIAGNOSIS — R1032 Left lower quadrant pain: Secondary | ICD-10-CM | POA: Diagnosis not present

## 2023-06-27 DIAGNOSIS — R1084 Generalized abdominal pain: Secondary | ICD-10-CM | POA: Diagnosis not present

## 2023-06-27 DIAGNOSIS — N189 Chronic kidney disease, unspecified: Secondary | ICD-10-CM | POA: Diagnosis not present

## 2023-06-27 DIAGNOSIS — I129 Hypertensive chronic kidney disease with stage 1 through stage 4 chronic kidney disease, or unspecified chronic kidney disease: Secondary | ICD-10-CM | POA: Insufficient documentation

## 2023-06-27 DIAGNOSIS — E119 Type 2 diabetes mellitus without complications: Secondary | ICD-10-CM | POA: Insufficient documentation

## 2023-06-27 DIAGNOSIS — R109 Unspecified abdominal pain: Secondary | ICD-10-CM | POA: Diagnosis present

## 2023-06-27 LAB — URINALYSIS, ROUTINE W REFLEX MICROSCOPIC
Bacteria, UA: NONE SEEN
Bilirubin Urine: NEGATIVE
Glucose, UA: NEGATIVE mg/dL
Ketones, ur: NEGATIVE mg/dL
Leukocytes,Ua: NEGATIVE
Nitrite: NEGATIVE
Protein, ur: NEGATIVE mg/dL
Specific Gravity, Urine: 1.029 (ref 1.005–1.030)
pH: 6 (ref 5.0–8.0)

## 2023-06-27 LAB — CBC
HCT: 39.4 % (ref 36.0–46.0)
Hemoglobin: 13.1 g/dL (ref 12.0–15.0)
MCH: 30.3 pg (ref 26.0–34.0)
MCHC: 33.2 g/dL (ref 30.0–36.0)
MCV: 91 fL (ref 80.0–100.0)
Platelets: 197 10*3/uL (ref 150–400)
RBC: 4.33 MIL/uL (ref 3.87–5.11)
RDW: 13.1 % (ref 11.5–15.5)
WBC: 10.4 10*3/uL (ref 4.0–10.5)
nRBC: 0 % (ref 0.0–0.2)

## 2023-06-27 LAB — COMPREHENSIVE METABOLIC PANEL
ALT: 17 U/L (ref 0–44)
AST: 17 U/L (ref 15–41)
Albumin: 3.8 g/dL (ref 3.5–5.0)
Alkaline Phosphatase: 56 U/L (ref 38–126)
Anion gap: 9 (ref 5–15)
BUN: 7 mg/dL (ref 6–20)
CO2: 28 mmol/L (ref 22–32)
Calcium: 8.9 mg/dL (ref 8.9–10.3)
Chloride: 98 mmol/L (ref 98–111)
Creatinine, Ser: 0.78 mg/dL (ref 0.44–1.00)
GFR, Estimated: >60 mL/min (ref >60)
Glucose, Bld: 107 mg/dL — ABNORMAL HIGH (ref 70–99)
Potassium: 3.3 mmol/L — ABNORMAL LOW (ref 3.5–5.1)
Sodium: 135 mmol/L (ref 135–145)
Total Bilirubin: 0.5 mg/dL (ref 0.0–1.2)
Total Protein: 7 g/dL (ref 6.5–8.1)

## 2023-06-27 LAB — POCT URINALYSIS DIP (MANUAL ENTRY)
Bilirubin, UA: NEGATIVE
Glucose, UA: NEGATIVE mg/dL
Ketones, POC UA: NEGATIVE mg/dL
Leukocytes, UA: NEGATIVE
Nitrite, UA: NEGATIVE
Protein Ur, POC: NEGATIVE mg/dL
Spec Grav, UA: 1.02 (ref 1.010–1.025)
Urobilinogen, UA: 0.2 U/dL
pH, UA: 7 (ref 5.0–8.0)

## 2023-06-27 LAB — LIPASE, BLOOD: Lipase: 26 U/L (ref 11–51)

## 2023-06-27 MED ORDER — ONDANSETRON HCL 4 MG/2ML IJ SOLN
4.0000 mg | Freq: Once | INTRAMUSCULAR | Status: AC
  Administered 2023-06-27: 4 mg via INTRAVENOUS
  Filled 2023-06-27: qty 2

## 2023-06-27 MED ORDER — FENTANYL CITRATE PF 50 MCG/ML IJ SOSY
50.0000 ug | PREFILLED_SYRINGE | INTRAMUSCULAR | Status: AC | PRN
  Administered 2023-06-27 – 2023-06-28 (×2): 50 ug via INTRAVENOUS
  Filled 2023-06-27 (×2): qty 1

## 2023-06-27 MED ORDER — KETOROLAC TROMETHAMINE 60 MG/2ML IM SOLN
60.0000 mg | Freq: Once | INTRAMUSCULAR | Status: AC
  Administered 2023-06-27: 60 mg via INTRAMUSCULAR

## 2023-06-27 MED ORDER — IOHEXOL 350 MG/ML SOLN
75.0000 mL | Freq: Once | INTRAVENOUS | Status: AC | PRN
  Administered 2023-06-27: 75 mL via INTRAVENOUS

## 2023-06-27 NOTE — ED Notes (Signed)
 Patient is being discharged from the Urgent Care and sent to the Emergency Department via POV . Per provider, patient is in need of higher level of care due to severe abdominal pain. Patient is aware and verbalizes understanding of plan of care.  Vitals:   06/27/23 1758  BP: 133/87  Pulse: 88  Resp: 18  Temp: 99.2 F (37.3 C)  SpO2: 95%

## 2023-06-27 NOTE — ED Triage Notes (Signed)
 Pt reports right low quadrant pain and pain around navel, fever 101.0 F x 1 day. Tylenol gives some relief. Pain is worse when atkinga deep breath or put some pressure on.

## 2023-06-27 NOTE — Discharge Instructions (Signed)
 Please go straight to the hospital as you are in need of a higher level of care than we can provide in the urgent care setting. You will likely need a CT scan to rule out an acute abdomen, diverticulitis, appendicitis. Do not eat or drink anything until told to do so otherwise by a the ER provider.

## 2023-06-27 NOTE — ED Triage Notes (Signed)
 Pt c/o umbilicus pain radiating to left side abdomen, onset yesterday with a fever today. Seen at [facility] and referred to ER for further imaging. Tender on palpation. Denies VD, has been nauseated. UC gave toradol IM injection.

## 2023-06-27 NOTE — ED Provider Triage Note (Signed)
 Emergency Medicine Provider Triage Evaluation Note  Isabel Evans , a 53 y.o. female  was evaluated in triage.  Pt complains of abdominal pain.  Pain started at her umbilicus and migrated to the right lower quadrant.  She has been having shaking chills.  She states that it hurts when she pushes on the left side of her lower belly.  Review of Systems  Positive: Right lower quadrant abdominal pain Negative: Vomiting  Physical Exam  BP 125/85 (BP Location: Right Arm)   Pulse 93   Temp 98.8 F (37.1 C)   Resp 16   LMP 07/04/2017   SpO2 99%  Gen:   Awake, no distress   Resp:  Normal effort  MSK:   Moves extremities without difficulty  Other:  Positive Rovsing and rebound tenderness in the right lower quadrant  Medical Decision Making  Medically screening exam initiated at 7:46 PM.  Appropriate orders placed.  Marisue Brooklyn was informed that the remainder of the evaluation will be completed by another provider, this initial triage assessment does not replace that evaluation, and the importance of remaining in the ED until their evaluation is complete.  Suspect appendicitis   Arthor Captain, PA-C 06/27/23 1946

## 2023-06-27 NOTE — ED Provider Notes (Signed)
 [facility] - URGENT [facility]ARE [facility]  Note:  This document was prepared using Conservation officer, historic buildings and may include unintentional dictation errors.  MRN: [id number] DOB: 11-11-1970  Subjective:   Konya Fauble is a 53 y.o. female presenting for 2-day history of acute onset persistent and worsening lower abdominal pain, right lower quadrant pain.  Has had a fever as well.  Pain is worse with pressure on her abdomen or taking a deep breath.  No diarrhea, bloody stools, constipation.  Last bowel movement was yesterday and was a complete bowel movement.  No bloody stools.  No nausea, vomiting.  No cough, chest pain, shortness of breath.  No history of GI disorders.  No urinary symptoms.  No history of kidney stones.  No current facility-administered medications for this encounter.  Current Outpatient Medications:    clobetasol cream (TEMOVATE) 0.05 %, Apply to affected areas on elbow and back of neck 2 (two) times daily., Disp: 60 g, Rfl: 1   dicyclomine (BENTYL) 10 MG capsule, Take 1 capsule (10 mg total) by mouth 2 (two) times daily as needed., Disp: 180 capsule, Rfl: 2   diphenhydrAMINE (BENADRYL) 25 MG tablet, Take 1 tablet (25 mg total) by mouth daily., Disp: 30 tablet, Rfl: 0   escitalopram (LEXAPRO) 20 MG tablet, Take 1 tablet (20 mg total) by mouth daily., Disp: 90 tablet, Rfl: 1   estradiol (ESTRACE) 0.5 MG tablet, Take 1 tablet (0.5 mg total) by mouth daily., Disp: 90 tablet, Rfl: 3   fluticasone (FLONASE) 50 MCG/ACT nasal spray, PLACE 2 SPRAYS INTO BOTH NOSTRILS DAILY., Disp: 16 g, Rfl: 2   gabapentin (NEURONTIN) 300 MG capsule, Take 1 capsule (300 mg total) by mouth 3 (three) times daily., Disp: 270 capsule, Rfl: 1   lidocaine (LIDODERM) 5 %, Place 1 patch onto the skin daily. Remove & Discard patch within 12 hours or as directed by MD, Disp: 30 patch, Rfl: 2   meloxicam (MOBIC) 7.5 MG tablet, Take 1 tablet (7.5 mg total) by mouth daily., Disp: 30 tablet, Rfl: 2    metFORMIN (GLUCOPHAGE) 500 MG tablet, Take 1 tablet (500 mg total) by mouth daily with breakfast., Disp: 90 tablet, Rfl: 1   omeprazole (PRILOSEC) 40 MG capsule, Take 1 capsule (40 mg total) by mouth daily., Disp: 90 capsule, Rfl: 1   ondansetron (ZOFRAN-ODT) 8 MG disintegrating tablet, Dissolve 1 tablet (8 mg total) by mouth daily as needed for nausea or vomiting., Disp: 30 tablet, Rfl: 2   QUEtiapine (SEROQUEL) 100 MG tablet, Take 1 tablet (100 mg total) by mouth at bedtime., Disp: 90 tablet, Rfl: 1   rosuvastatin (CRESTOR) 20 MG tablet, TAKE 1 TABLET (20 MG TOTAL) BY MOUTH DAILY TO LOWER CHOLESTEROL, Disp: 90 tablet, Rfl: 1   tiZANidine (ZANAFLEX) 4 MG tablet, Take 1 tablet (4 mg total) by mouth every 8 (eight) hours as needed., Disp: 90 tablet, Rfl: 0   traMADol (ULTRAM) 50 MG tablet, Take 1 tablet (50 mg total) by mouth at bedtime as needed., Disp: 30 tablet, Rfl: 3   No Known Allergies  Past Medical History:  Diagnosis Date   Anemia 11/24/2017   Anxiety    Bone spur    cervical spine   Chronic kidney disease 04/2017   kidney infections   Depression    Deviated nasal septum    states is unable to lie flat, because her nose will become congested and she can stop breathing   Diabetes mellitus without complication (HCC)    Dysrhythmia  heart flutters occasionally   Fibromyalgia    Fibromyalgia    GERD (gastroesophageal reflux disease)    Hallux abductovalgus with bunions 09/2014   right    Hammertoe 09/2014   right 4th, 5th   History of stomach ulcers    Hyperlipidemia    Hypertension    states is under control with med., has been on med. x 8 mos.   IBS (irritable bowel syndrome)    no current med.   Osteoarthritis    hands, bilateral hips   Peripheral vascular disease (HCC)    occasional swelling in both legs   Sinus headache    UTI (urinary tract infection) 05/21/2013     Past Surgical History:  Procedure Laterality Date   ABDOMINAL HYSTERECTOMY     ANTERIOR CERVICAL  DECOMP/DISCECTOMY FUSION N/A 07/30/2020   Procedure: C5-6 ANTERIOR CERVICAL DECOMPRESSION/DISCECTOMY FUSION, ALLOGRAFT, PLATE;  Surgeon: Eldred Manges, MD;  Location: [facility];  Service: Orthopedics;  Laterality: N/A;   BREAST BIOPSY Left 04/2018   benign   BREAST BIOPSY Left 2016   benign   BUNIONECTOMY Right 10/04/2014   Procedure:  Serafina Royals, Donzetta Sprung;  Surgeon: Vivi Barrack, DPM;  Location: [facility];  Service: Podiatry;  Laterality: Right;   COLONOSCOPY WITH PROPOFOL  02/13/2013   CYSTOSCOPY N/A 07/07/2017   Procedure: CYSTOSCOPY;  Surgeon: Palestine Bing, MD;  Location: [facility];  Service: Gynecology;  Laterality: N/A;   DUPUYTREN / PALMAR FASCIOTOMY Right 07/18/2013   exc. 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;  Service: Podiatry;  Laterality: Right;   LEG SURGERY Left    as a child; near amputation of leg   TUBAL LIGATION     VAGINAL HYSTERECTOMY N/A 07/07/2017   Procedure: HYSTERECTOMY VAGINAL;  Surgeon: [place] Bing, MD;  Location: [facility];  Service: Gynecology;  Laterality: N/A;    Family History  Problem Relation Age of Onset   Hypertension Mother    Diabetes Mother    Heart disease Mother    Hypertension Father    Diabetes Father    Prostate cancer Father    Heart disease Father    Alcohol abuse Father    Hypertension Sister    Diabetes Sister    Heart disease Maternal Grandmother        Great GM   Breast cancer Maternal Grandmother    Bone cancer Maternal Grandmother    Breast cancer Maternal Aunt    Ovarian cancer Maternal Aunt    Rheum arthritis Sister    Colon cancer Other        great Aunt   Rectal cancer Neg Hx    Stomach cancer Neg Hx     Social History   Tobacco Use    Smoking status: Former    Current packs/day: 0.00    Types: Cigarettes, E-cigarettes    Start date: 08/24/1992    Quit date: 08/24/2012    Years since quitting: 10.8   Smokeless tobacco: Never  Vaping Use   Vaping status: Every Day   Substances: Flavoring  Substance Use Topics   Alcohol use: Yes    Comment: occasionally   Drug use: Yes    Types: Marijuana    Comment: occ.  ROS   Objective:   Vitals: BP 133/87 (BP Location: Right Arm)   Pulse 88   Temp 99.2 F (37.3 C) (Oral)   Resp 18   LMP 07/04/2017   SpO2 95%   Physical Exam Constitutional:      General: She is not in acute distress.    Appearance: Normal appearance. She is well-developed. She is not ill-appearing, toxic-appearing or diaphoretic.  HENT:     Head: Normocephalic and atraumatic.     Nose: Nose normal.     Mouth/Throat:     Mouth: Mucous membranes are moist.     Pharynx: Oropharynx is clear.  Eyes:     General: No scleral icterus.       Right eye: No discharge.        Left eye: No discharge.     Extraocular Movements: Extraocular movements intact.     Conjunctiva/sclera: Conjunctivae normal.  Cardiovascular:     Rate and Rhythm: Normal rate.  Pulmonary:     Effort: Pulmonary effort is normal.  Abdominal:     General: Bowel sounds are normal. There is no distension.     Palpations: Abdomen is soft. There is no mass.     Tenderness: There is abdominal tenderness in the right lower quadrant, periumbilical area and left lower quadrant. There is guarding. There is no right CVA tenderness, left CVA tenderness or rebound. Negative signs include Murphy's sign.  Skin:    General: Skin is warm and dry.  Neurological:     General: No focal deficit present.     Mental Status: She is alert and oriented to person, place, and time.  Psychiatric:        Mood and Affect: Mood normal.        Behavior: Behavior normal.        Thought Content: Thought content normal.        Judgment: Judgment normal.      Results for orders placed or performed during the hospital encounter of 06/27/23 (from the past 24 hours)  POCT urinalysis dipstick     Status: Abnormal   Collection Time: 06/27/23  6:06 PM  Result Value Ref Range   Color, UA yellow yellow   Clarity, UA clear clear   Glucose, UA negative negative mg/dL   Bilirubin, UA negative negative   Ketones, POC UA negative negative mg/dL   Spec Grav, UA 2.952 8.413 - 1.025   Blood, UA trace-intact (A) negative   pH, UA 7.0 5.0 - 8.0   Protein Ur, POC negative negative mg/dL   Urobilinogen, UA 0.2 0.2 or 1.0 E.U./dL   Nitrite, UA Negative Negative   Leukocytes, UA Negative Negative   *Note: Due to a large number of results and/or encounters for the requested time period, some results have not been displayed. A complete set of results can be found in Results Review.    Assessment and Plan :   PDMP not reviewed this encounter.  1. RLQ abdominal pain   2. LLQ pain    Patient is in need of higher level of care than we can provide in the urgent care setting.  This includes consideration for CT scan, blood work to rule out an acute abdomen, diverticulitis, appendicitis among other intra-abdominal emergencies.  I discussed this with patient.  She requested some pain management in clinic, therefore I provided a Toradol injection of 60 mg IM.  Patient is to present to the emergency room now.   Wallis Bamberg, [place] 06/27/23 2440

## 2023-06-28 ENCOUNTER — Other Ambulatory Visit (HOSPITAL_COMMUNITY): Payer: Self-pay

## 2023-06-28 ENCOUNTER — Ambulatory Visit: Payer: Medicaid Other | Admitting: Orthopaedic Surgery

## 2023-06-28 ENCOUNTER — Emergency Department (HOSPITAL_COMMUNITY)

## 2023-06-28 MED ORDER — ONDANSETRON HCL 4 MG/2ML IJ SOLN
4.0000 mg | Freq: Once | INTRAMUSCULAR | Status: AC
  Administered 2023-06-28: 4 mg via INTRAVENOUS
  Filled 2023-06-28: qty 2

## 2023-06-28 MED ORDER — HYDROMORPHONE HCL 1 MG/ML IJ SOLN
1.0000 mg | Freq: Once | INTRAMUSCULAR | Status: AC
  Administered 2023-06-28: 1 mg via INTRAVENOUS
  Filled 2023-06-28: qty 1

## 2023-06-28 MED ORDER — DICYCLOMINE HCL 20 MG PO TABS
20.0000 mg | ORAL_TABLET | Freq: Two times a day (BID) | ORAL | 0 refills | Status: DC
  Filled 2023-06-28 – 2023-07-31 (×3): qty 20, 10d supply, fill #0

## 2023-06-28 MED ORDER — ONDANSETRON 4 MG PO TBDP
4.0000 mg | ORAL_TABLET | Freq: Three times a day (TID) | ORAL | 0 refills | Status: DC | PRN
Start: 2023-06-28 — End: 2023-08-30
  Filled 2023-06-28 – 2023-07-31 (×2): qty 20, 7d supply, fill #0

## 2023-06-28 MED ORDER — FENTANYL CITRATE PF 50 MCG/ML IJ SOSY
50.0000 ug | PREFILLED_SYRINGE | Freq: Once | INTRAMUSCULAR | Status: AC
  Administered 2023-06-28: 50 ug via INTRAVENOUS
  Filled 2023-06-28: qty 1

## 2023-06-28 NOTE — ED Provider Notes (Signed)
 [place] EMERGENCY DEPARTMENT AT [facility] Provider Note   CSN: [id number] Arrival date & time: 06/27/23  1857     History  Chief Complaint  Patient presents with   Abdominal Pain    Isabel Evans is a 53 y.o. female.  53 year old female presents today for concern of abdominal pain in the right lower quadrant that she was seen in the urgent care for and sent to the emergency department.  Have concern for appendicitis.  States she still has significant pain.  Endorses some nausea and vomiting.  The history is provided by the patient. No language interpreter was used.       Home Medications Prior to Admission medications   Medication Sig Start Date End Date Taking? Authorizing Provider  dicyclomine (BENTYL) 20 MG tablet Take 1 tablet (20 mg total) by mouth 2 (two) times daily. 06/28/23  Yes Shaydon Lease, PA-C  ondansetron (ZOFRAN-ODT) 4 MG disintegrating tablet Take 1 tablet (4 mg total) by mouth every 8 (eight) hours as needed. 06/28/23  Yes Karie Mainland, [place] Spees, PA-C  clobetasol cream (TEMOVATE) 0.05 % Apply to affected areas on elbow and back of neck 2 (two) times daily. 04/27/23   Hoy Register, MD  diphenhydrAMINE (BENADRYL) 25 MG tablet Take 1 tablet (25 mg total) by mouth daily. 01/26/22   Storm Frisk, MD  escitalopram (LEXAPRO) 20 MG tablet Take 1 tablet (20 mg total) by mouth daily. 02/24/23   Hoy Register, MD  estradiol (ESTRACE) 0.5 MG tablet Take 1 tablet (0.5 mg total) by mouth daily. 02/28/23   Hoy Register, MD  fluticasone (FLONASE) 50 MCG/ACT nasal spray PLACE 2 SPRAYS INTO BOTH NOSTRILS DAILY. 04/27/23   Hoy Register, MD  gabapentin (NEURONTIN) 300 MG capsule Take 1 capsule (300 mg total) by mouth 3 (three) times daily. 02/24/23   Hoy Register, MD  lidocaine (LIDODERM) 5 % Place 1 patch onto the skin daily. Remove & Discard patch within 12 hours or as directed by MD 01/27/23   Hoy Register, MD  meloxicam (MOBIC) 7.5 MG tablet Take 1 tablet (7.5  mg total) by mouth daily. 04/27/23   Hoy Register, MD  metFORMIN (GLUCOPHAGE) 500 MG tablet Take 1 tablet (500 mg total) by mouth daily with breakfast. 02/24/23   Hoy Register, MD  omeprazole (PRILOSEC) 40 MG capsule Take 1 capsule (40 mg total) by mouth daily. 02/24/23   Hoy Register, MD  QUEtiapine (SEROQUEL) 100 MG tablet Take 1 tablet (100 mg total) by mouth at bedtime. 02/24/23   Hoy Register, MD  rosuvastatin (CRESTOR) 20 MG tablet TAKE 1 TABLET (20 MG TOTAL) BY MOUTH DAILY TO LOWER CHOLESTEROL 02/24/23   Hoy Register, MD  tiZANidine (ZANAFLEX) 4 MG tablet Take 1 tablet (4 mg total) by mouth every 8 (eight) hours as needed. 06/06/23   Hoy Register, MD  traMADol (ULTRAM) 50 MG tablet Take 1 tablet (50 mg total) by mouth at bedtime as needed. 04/27/23   Hoy Register, MD  clonazePAM (KLONOPIN) 0.5 MG tablet Take 1 bid prn anxiety.  Use sparingly Patient not taking: Reported on 06/19/2020 03/24/20 06/19/20  Anders Simmonds, PA-C  traZODone (DESYREL) 50 MG tablet Take 1 tablet (50 mg total) by mouth at bedtime as needed for sleep (make repeat once in an hour if not asleep). 06/09/20 07/07/20  Charm Rings, NP  venlafaxine XR (EFFEXOR-XR) 150 MG 24 hr capsule TAKE 1 CAPSULE BY MOUTH DAILY WITH BREAKFAST. 03/17/20 05/05/20  Cain Saupe, MD  Allergies    Patient has no known allergies.    Review of Systems   Review of Systems  Constitutional:  Negative for chills and fever.  Respiratory:  Negative for shortness of breath.   Gastrointestinal:  Positive for abdominal pain, nausea and vomiting.  Genitourinary:  Negative for dysuria and frequency.  Neurological:  Negative for light-headedness.  All other systems reviewed and are negative.   Physical Exam Updated Vital Signs BP 117/81   Pulse 70   Temp 98.2 F (36.8 C)   Resp 16   LMP 07/04/2017   SpO2 99%  Physical Exam Vitals and nursing note reviewed.  Constitutional:      General: She is not in acute distress.     Appearance: Normal appearance. She is not ill-appearing.  HENT:     Head: Normocephalic and atraumatic.     Nose: Nose normal.  Eyes:     General: No scleral icterus.    Extraocular Movements: Extraocular movements intact.     Conjunctiva/sclera: Conjunctivae normal.  Cardiovascular:     Rate and Rhythm: Normal rate and regular rhythm.  Pulmonary:     Effort: Pulmonary effort is normal. No respiratory distress.     Breath sounds: Normal breath sounds. No wheezing or rales.  Abdominal:     General: There is no distension.     Tenderness: There is abdominal tenderness. There is no right CVA tenderness, left CVA tenderness or guarding.  Musculoskeletal:        General: Normal range of motion.     Cervical back: Normal range of motion.  Skin:    General: Skin is warm and dry.  Neurological:     General: No focal deficit present.     Mental Status: She is alert. Mental status is at baseline.     ED Results / Procedures / Treatments   Labs (all labs ordered are listed, but only abnormal results are displayed) Labs Reviewed  COMPREHENSIVE METABOLIC PANEL - Abnormal; Notable for the following components:      Result Value   Potassium 3.3 (*)    Glucose, Bld 107 (*)    All other components within normal limits  URINALYSIS, ROUTINE W REFLEX MICROSCOPIC - Abnormal; Notable for the following components:   Color, Urine STRAW (*)    Hgb urine dipstick SMALL (*)    All other components within normal limits  URINE CULTURE  LIPASE, BLOOD  CBC    EKG None  Radiology US Pelvis Complete Result Date: 06/28/2023 CLINICAL DATA:  Pelvic pain.  Status post partial hysterectomy. EXAM: TRANSABDOMINAL AND TRANSVAGINAL ULTRASOUND OF PELVIS DOPPLER ULTRASOUND OF OVARIES TECHNIQUE: Both transabdominal and transvaginal ultrasound examinations of the pelvis were performed. Transabdominal technique was performed for global imaging of the pelvis including uterus, ovaries, adnexal regions, and pelvic  cul-de-sac. It was necessary to proceed with endovaginal exam following the transabdominal exam to visualize the ovaries. Color and duplex Doppler ultrasound was utilized to evaluate blood flow to the ovaries. COMPARISON:  None Available. FINDINGS: Uterus Surgically absent. Right ovary Measurements: 1.8 x 0.6 x 1.5 cm = volume: 0.9 mL. Normal appearance/no adnexal mass. Left ovary Measurements: 3.3 x 2.0 x 3.2 cm = volume: 11.2 mL. Simple appearing cyst measures approximately 2.1 x 1.6 x 1.9 cm. Pulsed Doppler evaluation of both ovaries demonstrates normal low-resistance arterial and venous waveforms. No evidence to suggest ovarian torsion. Other findings No abnormal free fluid. IMPRESSION: 1. 2.1 cm simple appearing cyst of the left ovary. This requires no  follow-up. 2. No evidence of ovarian torsion. 3. Status post hysterectomy. Electronically Signed   By: Irish Lack M.D.   On: 06/28/2023 11:30   US Transvaginal Non-OB Result Date: 06/28/2023 CLINICAL DATA:  Pelvic pain.  Status post partial hysterectomy. EXAM: TRANSABDOMINAL AND TRANSVAGINAL ULTRASOUND OF PELVIS DOPPLER ULTRASOUND OF OVARIES TECHNIQUE: Both transabdominal and transvaginal ultrasound examinations of the pelvis were performed. Transabdominal technique was performed for global imaging of the pelvis including uterus, ovaries, adnexal regions, and pelvic cul-de-sac. It was necessary to proceed with endovaginal exam following the transabdominal exam to visualize the ovaries. Color and duplex Doppler ultrasound was utilized to evaluate blood flow to the ovaries. COMPARISON:  None Available. FINDINGS: Uterus Surgically absent. Right ovary Measurements: 1.8 x 0.6 x 1.5 cm = volume: 0.9 mL. Normal appearance/no adnexal mass. Left ovary Measurements: 3.3 x 2.0 x 3.2 cm = volume: 11.2 mL. Simple appearing cyst measures approximately 2.1 x 1.6 x 1.9 cm. Pulsed Doppler evaluation of both ovaries demonstrates normal low-resistance arterial and venous  waveforms. No evidence to suggest ovarian torsion. Other findings No abnormal free fluid. IMPRESSION: 1. 2.1 cm simple appearing cyst of the left ovary. This requires no follow-up. 2. No evidence of ovarian torsion. 3. Status post hysterectomy. Electronically Signed   By: Irish Lack M.D.   On: 06/28/2023 11:30   [place] Art/Ven Flow Abd Pelv Doppler Result Date: 06/28/2023 CLINICAL DATA:  Pelvic pain.  Status post partial hysterectomy. EXAM: TRANSABDOMINAL AND TRANSVAGINAL ULTRASOUND OF PELVIS DOPPLER ULTRASOUND OF OVARIES TECHNIQUE: Both transabdominal and transvaginal ultrasound examinations of the pelvis were performed. Transabdominal technique was performed for global imaging of the pelvis including uterus, ovaries, adnexal regions, and pelvic cul-de-sac. It was necessary to proceed with endovaginal exam following the transabdominal exam to visualize the ovaries. Color and duplex Doppler ultrasound was utilized to evaluate blood flow to the ovaries. COMPARISON:  None Available. FINDINGS: Uterus Surgically absent. Right ovary Measurements: 1.8 x 0.6 x 1.5 cm = volume: 0.9 mL. Normal appearance/no adnexal mass. Left ovary Measurements: 3.3 x 2.0 x 3.2 cm = volume: 11.2 mL. Simple appearing cyst measures approximately 2.1 x 1.6 x 1.9 cm. Pulsed Doppler evaluation of both ovaries demonstrates normal low-resistance arterial and venous waveforms. No evidence to suggest ovarian torsion. Other findings No abnormal free fluid. IMPRESSION: 1. 2.1 cm simple appearing cyst of the left ovary. This requires no follow-up. 2. No evidence of ovarian torsion. 3. Status post hysterectomy. Electronically Signed   By: Irish Lack M.D.   On: 06/28/2023 11:30   CT ABDOMEN PELVIS W CONTRAST Result Date: 06/27/2023 CLINICAL DATA:  Right lower quadrant pain EXAM: CT ABDOMEN AND PELVIS WITH CONTRAST TECHNIQUE: Multidetector CT imaging of the abdomen and pelvis was performed using the standard protocol following bolus  administration of intravenous contrast. RADIATION DOSE REDUCTION: This exam was performed according to the departmental dose-optimization program which includes automated exposure control, adjustment of the mA and/or kV according to patient size and/or use of iterative reconstruction technique. CONTRAST:  75mL OMNIPAQUE IOHEXOL 350 MG/ML SOLN COMPARISON:  CT abdomen and pelvis 04/23/2020 FINDINGS: Lower chest: No acute abnormality. Hepatobiliary: No focal liver abnormality is seen. No gallstones, gallbladder wall thickening, or biliary dilatation. Pancreas: Unremarkable. No pancreatic ductal dilatation or surrounding inflammatory changes. Spleen: Normal in size without focal abnormality. Adrenals/Urinary Tract: There is scarring in the right kidney. Otherwise, the adrenal glands and kidneys are within normal limits. There is mild diffuse bladder wall thickening versus normal under distension. Stomach/Bowel: Stomach is within  normal limits. Appendix appears normal. No evidence of bowel wall thickening, distention, or inflammatory changes. Vascular/Lymphatic: No significant vascular findings are present. No enlarged abdominal or pelvic lymph nodes. Reproductive: Status post hysterectomy. No adnexal masses. There is an 18 mm cyst or dominant follicle in the left ovary. Other: There are degenerative changes at L4-L5. Musculoskeletal: No fracture is seen. IMPRESSION: 1. Mild diffuse bladder wall thickening versus normal under distension. Correlate clinically for cystitis. 2. 18 mm cyst or dominant follicle in the left ovary. No follow-up imaging recommended. 3. Status post hysterectomy. Electronically Signed   By: Darliss Cheney M.D.   On: 06/27/2023 22:43    Procedures Procedures    Medications Ordered in ED Medications  iohexol (OMNIPAQUE) 350 MG/ML injection 75 mL (75 mLs Intravenous Contrast Given 06/27/23 2022)  fentaNYL (SUBLIMAZE) injection 50 mcg (50 mcg Intravenous Given 06/28/23 0446)  ondansetron  (ZOFRAN) injection 4 mg (4 mg Intravenous Given 06/27/23 2259)  fentaNYL (SUBLIMAZE) injection 50 mcg (50 mcg Intravenous Given 06/28/23 0803)  ondansetron (ZOFRAN) injection 4 mg (4 mg Intravenous Given 06/28/23 0802)  HYDROmorphone (DILAUDID) injection 1 mg (1 mg Intravenous Given 06/28/23 1223)    ED Course/ Medical Decision Making/ A&P                                 Medical Decision Making Amount and/or Complexity of Data Reviewed Labs: ordered. Radiology: ordered.  Risk Prescription drug management.   Medical Decision Making / ED Course   This patient presents to the ED for concern of abdominal pain, this involves an extensive number of treatment options, and is a complaint that carries with it a high risk of complications and morbidity.  The differential diagnosis includes appendicitis, gastroenteritis, colitis, diverticulitis, torsion, UTI, pyelonephritis  MDM: 53 year old female presents today for concern of abdominal pain and nausea and vomiting.  Significant tenderness on exam.  However CT without acute concerns.  Given ongoing pain and pain in the lower abdomen will obtain pelvic ultrasound.  Pelvic ultrasound also without acute finding.  Additional pain control given.  Patient was able to tolerate p.o.  However given the ongoing pain I did offer patient admission but she states that this point she would like to go home and she will follow-up with her PCP.  Strict return precautions given. Bentyl and Zofran prescribed.  Initially discussed given her opioid pain medication however she has chronic tramadol prescription.  She can take that in addition of these medications.  Patient discharged in stable condition.  Return precaution discussed.  Patient voices understanding and is in agreement with plan.   Lab Tests: -I ordered, reviewed, and interpreted labs.   The pertinent results include:   Labs Reviewed  COMPREHENSIVE METABOLIC PANEL - Abnormal; Notable for the following  components:      Result Value   Potassium 3.3 (*)    Glucose, Bld 107 (*)    All other components within normal limits  URINALYSIS, ROUTINE W REFLEX MICROSCOPIC - Abnormal; Notable for the following components:   Color, Urine STRAW (*)    Hgb urine dipstick SMALL (*)    All other components within normal limits  URINE CULTURE  LIPASE, BLOOD  CBC      EKG  EKG Interpretation Date/Time:    Ventricular Rate:    PR Interval:    QRS Duration:    QT Interval:    QTC Calculation:   R Axis:  Text Interpretation:           Imaging Studies ordered: I ordered imaging studies including CT abdomen pelvis with contrast, pelvic x-ray I independently visualized and interpreted imaging. I agree with the radiologist interpretation   Medicines ordered and prescription drug management: Meds ordered this encounter  Medications   iohexol (OMNIPAQUE) 350 MG/ML injection 75 mL   fentaNYL (SUBLIMAZE) injection 50 mcg   ondansetron (ZOFRAN) injection 4 mg   fentaNYL (SUBLIMAZE) injection 50 mcg   ondansetron (ZOFRAN) injection 4 mg   HYDROmorphone (DILAUDID) injection 1 mg   dicyclomine (BENTYL) 20 MG tablet    Sig: Take 1 tablet (20 mg total) by mouth 2 (two) times daily.    Dispense:  20 tablet    Refill:  0    Supervising Provider:   Hyacinth Meeker, BRIAN [[id number]]   ondansetron (ZOFRAN-ODT) 4 MG disintegrating tablet    Sig: Take 1 tablet (4 mg total) by mouth every 8 (eight) hours as needed.    Dispense:  20 tablet    Refill:  0    Supervising Provider:   Eber Hong [[id number]]    -I have reviewed the patients home medicines and have made adjustments as needed  Critical interventions Pain control  Reevaluation: After the interventions noted above, I reevaluated the patient and found that they have :improved  Co morbidities that complicate the patient evaluation  Past Medical History:  Diagnosis Date   Anemia 11/24/2017   Anxiety    Bone spur    cervical spine   Chronic  kidney disease 04/2017   kidney infections   Depression    Deviated nasal septum    states is unable to lie flat, because her nose will become congested and she can stop breathing   Diabetes mellitus without complication (HCC)    Dysrhythmia    heart flutters occasionally   Fibromyalgia    Fibromyalgia    GERD (gastroesophageal reflux disease)    Hallux abductovalgus with bunions 09/2014   right    Hammertoe 09/2014   right 4th, 5th   History of stomach ulcers    Hyperlipidemia    Hypertension    states is under control with med., has been on med. x 8 mos.   IBS (irritable bowel syndrome)    no current med.   Osteoarthritis    hands, bilateral hips   Peripheral vascular disease (HCC)    occasional swelling in both legs   Sinus headache    UTI (urinary tract infection) 05/21/2013      Dispostion: Discharged in stable condition.  Return precaution discussed.  Patient voices understanding and is in agreement with the plan.   Final Clinical Impression(s) / ED Diagnoses Final diagnoses:  Generalized abdominal pain    Rx / DC Orders ED Discharge Orders          Ordered    dicyclomine (BENTYL) 20 MG tablet  2 times daily        06/28/23 1327    ondansetron (ZOFRAN-ODT) 4 MG disintegrating tablet  Every 8 hours PRN        06/28/23 1327              Marita Kansas, PA-C 06/28/23 1549    Wynetta Fines, MD 06/28/23 1554

## 2023-06-28 NOTE — Discharge Instructions (Signed)
 Follow-up with your primary care doctor.  No concerning findings on your CT abdomen pelvis, pelvic ultrasound.  However you still had ongoing pain.  You were able to tolerate p.o. intake.  We discussed admission for ongoing pain however you stated you will follow-up with your primary care doctor and at this point you would like to be discharged.  I have sent Bentyl and Zofran into the pharmacy for you..  After our discussion I noticed that you have a prescription of tramadol.  He can take this in addition to these medicines.  Given the already have a prescription for tramadol I will not send in additional narcotic medicine at this time.

## 2023-06-28 NOTE — ED Notes (Signed)
Pt given ginger ale and saltines for PO challenge

## 2023-06-28 NOTE — ED Notes (Signed)
 Patient states she may go somewhere else for additional pain medictions, [place], [place] notified.

## 2023-06-29 LAB — URINE CULTURE: Culture: <10000 — AB

## 2023-07-01 ENCOUNTER — Telehealth: Payer: Self-pay

## 2023-07-01 NOTE — Telephone Encounter (Signed)
 Copied from CRM [id number]. Topic: Clinical - Medical Advice >> Jul 01, 2023 11:39 AM Elmarie Shiley S wrote: Reason for CRM: Patient stated hospital urine culture came back as UTI patient requesting to speak to  nurse

## 2023-07-01 NOTE — Telephone Encounter (Signed)
 Urine culture performed by the hospital shows the following: <10,000 COLONIES/mL INSIGNIFICANT GROWTH  Performed at [facility], [address], [place], [place] [address] .  This is not a UTI as there is no sufficient bacteria growth that meets criteria for  UTI. Thanks

## 2023-07-01 NOTE — Telephone Encounter (Signed)
 Spoke with patient. Advised that she does not have UTI. Patient asked if the results we viewed were from [facility] hospital or from [facility] . I advised that the urine culture we viewed was from [facility] hospital . Patient voiced that she also had one done at  [facility] on the same day 06/27/2023. Advised that we can not see the results from [facility]. Advised that she call UC for her Results.

## 2023-07-04 ENCOUNTER — Ambulatory Visit
Admission: RE | Admit: 2023-07-04 | Discharge: 2023-07-04 | Disposition: A | Discharge status: Home / Self Care | Source: Ambulatory Visit | Attending: Family Medicine | Admitting: Family Medicine

## 2023-07-04 ENCOUNTER — Other Ambulatory Visit: Payer: Self-pay | Admitting: Family Medicine

## 2023-07-04 ENCOUNTER — Other Ambulatory Visit: Payer: Self-pay

## 2023-07-04 ENCOUNTER — Encounter: Payer: Self-pay | Admitting: Family Medicine

## 2023-07-04 DIAGNOSIS — N6310 Unspecified lump in the right breast, unspecified quadrant: Secondary | ICD-10-CM

## 2023-07-04 DIAGNOSIS — N6459 Other signs and symptoms in breast: Secondary | ICD-10-CM | POA: Diagnosis not present

## 2023-07-04 LAB — CBG MONITORING, ED: Glucose-Capillary: 119 mg/dL — ABNORMAL HIGH (ref 70–99)

## 2023-07-06 ENCOUNTER — Other Ambulatory Visit: Payer: Self-pay

## 2023-07-07 ENCOUNTER — Other Ambulatory Visit: Payer: Self-pay

## 2023-07-07 ENCOUNTER — Other Ambulatory Visit: Payer: Self-pay | Admitting: Family Medicine

## 2023-07-07 DIAGNOSIS — M545 Low back pain, unspecified: Secondary | ICD-10-CM

## 2023-07-07 DIAGNOSIS — R52 Pain, unspecified: Secondary | ICD-10-CM

## 2023-07-07 MED ORDER — CLOBETASOL PROPIONATE 0.05 % EX CREA
1.0000 | TOPICAL_CREAM | Freq: Two times a day (BID) | CUTANEOUS | 1 refills | Status: DC
Start: 2023-07-07 — End: 2024-02-07
  Filled 2023-07-07: qty 60, 14d supply, fill #0
  Filled 2023-07-25: qty 30, 14d supply, fill #0
  Filled 2023-08-17: qty 15, 14d supply, fill #0
  Filled 2023-08-30: qty 60, 60d supply, fill #0
  Filled 2023-10-25: qty 60, 60d supply, fill #1

## 2023-07-08 ENCOUNTER — Other Ambulatory Visit (HOSPITAL_COMMUNITY): Payer: Self-pay

## 2023-07-11 ENCOUNTER — Other Ambulatory Visit: Payer: Self-pay | Admitting: Family Medicine

## 2023-07-11 DIAGNOSIS — G8929 Other chronic pain: Secondary | ICD-10-CM

## 2023-07-11 DIAGNOSIS — M4722 Other spondylosis with radiculopathy, cervical region: Secondary | ICD-10-CM

## 2023-07-12 ENCOUNTER — Other Ambulatory Visit: Payer: Self-pay

## 2023-07-12 MED ORDER — LIDOCAINE 5 % EX PTCH
1.0000 | MEDICATED_PATCH | CUTANEOUS | 0 refills | Status: DC
  Filled 2023-07-12 – 2023-10-25 (×9): qty 30, 30d supply, fill #0

## 2023-07-13 ENCOUNTER — Other Ambulatory Visit: Payer: Self-pay

## 2023-07-19 ENCOUNTER — Other Ambulatory Visit (HOSPITAL_COMMUNITY): Payer: Self-pay

## 2023-07-19 ENCOUNTER — Other Ambulatory Visit: Payer: Self-pay

## 2023-07-20 NOTE — Progress Notes (Deleted)
 Patient ID: Jadene Stemmer, female   DOB: [birth year], 53 y.o.   MRN: [id number]   Royelle Hinchman is a 53 y.o. female.   53 year old female presents today for concern of abdominal pain in the right lower quadrant that she was seen in the urgent care for and sent to the emergency department.  Have concern for appendicitis.  States she still has significant pain.  Endorses some nausea and vomiting.   This patient presents to the ED for concern of abdominal pain, this involves an extensive number of treatment options, and is a complaint that carries with it a high risk of complications and morbidity.  The differential diagnosis includes appendicitis, gastroenteritis, colitis, diverticulitis, torsion, UTI, pyelonephritis   MDM: 52 year old female presents today for concern of abdominal pain and nausea and vomiting.  Significant tenderness on exam.  However CT without acute concerns.  Given ongoing pain and pain in the lower abdomen will obtain pelvic ultrasound.  Pelvic ultrasound also without acute finding.  Additional pain control given.  Patient was able to tolerate p.o.  However given the ongoing pain I did offer patient admission but she states that this point she would like to go home and she will follow-up with her PCP.  Strict return precautions given. Bentyl and Zofran prescribed.  Initially discussed given her opioid pain medication however she has chronic tramadol prescription.  She can take that in addition of these medications.  Patient discharged in stable condition.  Return precaution discussed.  Patient voices understanding and is in agreement with plan.

## 2023-07-21 ENCOUNTER — Inpatient Hospital Stay: Admitting: Physician Assistant

## 2023-07-22 ENCOUNTER — Other Ambulatory Visit: Payer: Self-pay

## 2023-07-26 ENCOUNTER — Other Ambulatory Visit: Payer: Self-pay

## 2023-07-27 ENCOUNTER — Inpatient Hospital Stay: Admitting: Physician Assistant

## 2023-07-27 NOTE — Progress Notes (Deleted)
 Patient ID: Santoria Chason, female   DOB: 1971-02-23, 53 y.o.   MRN: [id number] Patient ID: Loy Little, female   DOB: Mar 28, 1971, 53 y.o.   MRN: [id number]   Shanyla Marconi is a 53 y.o. female.   53 year old female presents today for concern of abdominal pain in the right lower quadrant that she was seen in the urgent care for and sent to the emergency department.  Have concern for appendicitis.  States she still has significant pain.  Endorses some nausea and vomiting.   This patient presents to the ED for concern of abdominal pain, this involves an extensive number of treatment options, and is a complaint that carries with it a high risk of complications and morbidity.  The differential diagnosis includes appendicitis, gastroenteritis, colitis, diverticulitis, torsion, UTI, pyelonephritis   MDM: 53 year old female presents today for concern of abdominal pain and nausea and vomiting.  Significant tenderness on exam.  However CT without acute concerns.  Given ongoing pain and pain in the lower abdomen will obtain pelvic ultrasound.  Pelvic ultrasound also without acute finding.  Additional pain control given.  Patient was able to tolerate p.o.  However given the ongoing pain I did offer patient admission but she states that this point she would like to go home and she will follow-up with her PCP.  Strict return precautions given. Bentyl and Zofran prescribed.  Initially discussed given her opioid pain medication however she has chronic tramadol prescription.  She can take that in addition of these medications.  Patient discharged in stable condition.  Return precaution discussed.  Patient voices understanding and is in agreement with plan.

## 2023-07-29 ENCOUNTER — Other Ambulatory Visit (HOSPITAL_COMMUNITY): Payer: Self-pay

## 2023-07-31 ENCOUNTER — Other Ambulatory Visit: Payer: Self-pay | Admitting: Family Medicine

## 2023-07-31 DIAGNOSIS — M545 Low back pain, unspecified: Secondary | ICD-10-CM

## 2023-07-31 DIAGNOSIS — F411 Generalized anxiety disorder: Secondary | ICD-10-CM

## 2023-07-31 DIAGNOSIS — R52 Pain, unspecified: Secondary | ICD-10-CM

## 2023-08-01 ENCOUNTER — Other Ambulatory Visit: Payer: Self-pay

## 2023-08-02 ENCOUNTER — Other Ambulatory Visit: Payer: Self-pay | Admitting: Family Medicine

## 2023-08-02 ENCOUNTER — Other Ambulatory Visit: Payer: Self-pay

## 2023-08-02 DIAGNOSIS — F411 Generalized anxiety disorder: Secondary | ICD-10-CM

## 2023-08-03 ENCOUNTER — Other Ambulatory Visit: Payer: Self-pay

## 2023-08-04 ENCOUNTER — Encounter: Payer: Self-pay | Admitting: Pharmacist

## 2023-08-04 ENCOUNTER — Other Ambulatory Visit: Payer: Self-pay

## 2023-08-08 ENCOUNTER — Other Ambulatory Visit: Payer: Self-pay

## 2023-08-17 ENCOUNTER — Other Ambulatory Visit: Payer: Self-pay | Admitting: Family Medicine

## 2023-08-17 ENCOUNTER — Other Ambulatory Visit: Payer: Self-pay

## 2023-08-17 DIAGNOSIS — M545 Low back pain, unspecified: Secondary | ICD-10-CM

## 2023-08-17 DIAGNOSIS — F411 Generalized anxiety disorder: Secondary | ICD-10-CM

## 2023-08-17 DIAGNOSIS — G548 Other nerve root and plexus disorders: Secondary | ICD-10-CM

## 2023-08-18 ENCOUNTER — Other Ambulatory Visit: Payer: Self-pay | Admitting: Family Medicine

## 2023-08-18 ENCOUNTER — Other Ambulatory Visit: Payer: Self-pay

## 2023-08-18 DIAGNOSIS — F411 Generalized anxiety disorder: Secondary | ICD-10-CM

## 2023-08-18 DIAGNOSIS — M545 Low back pain, unspecified: Secondary | ICD-10-CM

## 2023-08-18 DIAGNOSIS — R52 Pain, unspecified: Secondary | ICD-10-CM

## 2023-08-18 MED ORDER — TIZANIDINE HCL 4 MG PO TABS
4.0000 mg | ORAL_TABLET | Freq: Three times a day (TID) | ORAL | 0 refills | Status: DC | PRN
  Filled 2023-08-18 – 2023-08-30 (×2): qty 90, 30d supply, fill #0

## 2023-08-18 MED ORDER — FLUTICASONE PROPIONATE 50 MCG/ACT NA SUSP
2.0000 | Freq: Every day | NASAL | 1 refills | Status: DC
  Filled 2023-08-18 – 2023-08-30 (×2): qty 16, 30d supply, fill #0
  Filled 2023-09-26 – 2023-10-25 (×3): qty 16, 30d supply, fill #1

## 2023-08-18 MED ORDER — MELOXICAM 7.5 MG PO TABS
7.5000 mg | ORAL_TABLET | Freq: Every day | ORAL | 0 refills | Status: DC
  Filled 2023-08-18 – 2023-08-30 (×2): qty 30, 30d supply, fill #0

## 2023-08-23 ENCOUNTER — Other Ambulatory Visit (HOSPITAL_COMMUNITY): Payer: Self-pay

## 2023-08-23 ENCOUNTER — Encounter (HOSPITAL_COMMUNITY): Payer: Self-pay

## 2023-08-23 ENCOUNTER — Other Ambulatory Visit: Payer: Self-pay | Admitting: Family Medicine

## 2023-08-29 ENCOUNTER — Other Ambulatory Visit: Payer: Self-pay

## 2023-08-30 ENCOUNTER — Other Ambulatory Visit: Payer: Self-pay

## 2023-08-30 ENCOUNTER — Other Ambulatory Visit: Payer: Self-pay | Admitting: Family Medicine

## 2023-08-30 DIAGNOSIS — K219 Gastro-esophageal reflux disease without esophagitis: Secondary | ICD-10-CM

## 2023-08-30 DIAGNOSIS — M545 Low back pain, unspecified: Secondary | ICD-10-CM

## 2023-08-30 DIAGNOSIS — R7303 Prediabetes: Secondary | ICD-10-CM

## 2023-08-30 DIAGNOSIS — F331 Major depressive disorder, recurrent, moderate: Secondary | ICD-10-CM

## 2023-08-30 DIAGNOSIS — E782 Mixed hyperlipidemia: Secondary | ICD-10-CM

## 2023-08-31 ENCOUNTER — Other Ambulatory Visit: Payer: Self-pay

## 2023-08-31 MED ORDER — METFORMIN HCL 500 MG PO TABS
500.0000 mg | ORAL_TABLET | Freq: Every day | ORAL | 1 refills | Status: DC
Start: 2023-08-31 — End: 2023-11-09
  Filled 2023-08-31: qty 30, 30d supply, fill #0
  Filled 2023-10-09 – 2023-10-25 (×2): qty 30, 30d supply, fill #1

## 2023-08-31 MED ORDER — OMEPRAZOLE 40 MG PO CPDR
40.0000 mg | DELAYED_RELEASE_CAPSULE | Freq: Every day | ORAL | 1 refills | Status: DC
  Filled 2023-08-31: qty 90, 90d supply, fill #0

## 2023-08-31 MED ORDER — ROSUVASTATIN CALCIUM 20 MG PO TABS
20.0000 mg | ORAL_TABLET | Freq: Every day | ORAL | 1 refills | Status: DC
  Filled 2023-08-31: qty 30, 30d supply, fill #0
  Filled 2023-10-09 – 2023-10-25 (×2): qty 30, 30d supply, fill #1

## 2023-08-31 MED ORDER — DICYCLOMINE HCL 20 MG PO TABS
20.0000 mg | ORAL_TABLET | Freq: Two times a day (BID) | ORAL | 0 refills | Status: DC
  Filled 2023-08-31: qty 20, 10d supply, fill #0

## 2023-08-31 MED ORDER — ONDANSETRON 4 MG PO TBDP
4.0000 mg | ORAL_TABLET | Freq: Three times a day (TID) | ORAL | 0 refills | Status: DC | PRN
  Filled 2023-08-31: qty 20, 7d supply, fill #0

## 2023-08-31 MED ORDER — ESCITALOPRAM OXALATE 20 MG PO TABS
20.0000 mg | ORAL_TABLET | Freq: Every day | ORAL | 1 refills | Status: DC
  Filled 2023-08-31: qty 30, 30d supply, fill #0
  Filled 2023-10-09 – 2023-10-25 (×2): qty 30, 30d supply, fill #1

## 2023-09-01 ENCOUNTER — Other Ambulatory Visit: Payer: Self-pay

## 2023-09-01 MED ORDER — GABAPENTIN 300 MG PO CAPS
300.0000 mg | ORAL_CAPSULE | Freq: Three times a day (TID) | ORAL | 1 refills | Status: DC
  Filled 2023-09-01: qty 270, 90d supply, fill #0

## 2023-09-06 ENCOUNTER — Other Ambulatory Visit: Payer: Self-pay

## 2023-09-09 ENCOUNTER — Other Ambulatory Visit: Payer: Self-pay

## 2023-09-10 ENCOUNTER — Other Ambulatory Visit: Payer: Self-pay

## 2023-09-13 ENCOUNTER — Other Ambulatory Visit: Payer: Self-pay

## 2023-09-24 ENCOUNTER — Other Ambulatory Visit: Payer: Self-pay | Admitting: Family Medicine

## 2023-09-26 ENCOUNTER — Other Ambulatory Visit: Payer: Self-pay

## 2023-09-26 ENCOUNTER — Other Ambulatory Visit: Payer: Self-pay | Admitting: Family Medicine

## 2023-09-27 ENCOUNTER — Other Ambulatory Visit: Payer: Self-pay

## 2023-09-27 MED ORDER — DICYCLOMINE HCL 20 MG PO TABS
20.0000 mg | ORAL_TABLET | Freq: Two times a day (BID) | ORAL | 0 refills | Status: DC
  Filled 2023-09-27 – 2023-10-25 (×3): qty 20, 10d supply, fill #0

## 2023-09-27 NOTE — Telephone Encounter (Signed)
 Requested medication (s) are due for refill today:   Provider to review  Requested medication (s) are on the active medication list:   Yes  Future visit scheduled:   Yes 10/11/2023.      LOV 02/24/2023.   07/27/2023 No Show   Last ordered: 08/31/2023 #20, 0 refills   Unable to refill because OV due per note in chart.   Been notified that an OV is needed for refills.   Pt was a No Show.     Requested Prescriptions  Pending Prescriptions Disp Refills   dicyclomine  (BENTYL ) 20 MG tablet 20 tablet 0    Sig: Take 1 tablet (20 mg total) by mouth 2 (two) times daily.     Gastroenterology:  Antispasmodic Agents Passed - 09/27/2023  1:38 PM      Passed - Valid encounter within last 12 months    Recent Outpatient Visits           7 months ago Wears dentures   [facility] [place] Joaquin Mulberry, MD   1 year ago Dupuytren's contracture of both hands   [facility] [place] Joaquin Mulberry, MD   1 year ago Essential hypertension   [facility] [place]. [facility] Vernell Goldsmith, MD   2 years ago Pre-diabetes   [facility] [facility] [place]. [facility] Joaquin Mulberry, MD   2 years ago Vasomotor symptoms due to menopause   [facility] Of . [facility] Joaquin Mulberry, MD

## 2023-09-28 ENCOUNTER — Other Ambulatory Visit: Payer: Self-pay

## 2023-10-05 ENCOUNTER — Other Ambulatory Visit: Payer: Self-pay

## 2023-10-07 ENCOUNTER — Other Ambulatory Visit: Payer: Self-pay

## 2023-10-10 ENCOUNTER — Other Ambulatory Visit: Payer: Self-pay

## 2023-10-11 ENCOUNTER — Ambulatory Visit: Admitting: Family Medicine

## 2023-10-24 ENCOUNTER — Other Ambulatory Visit: Payer: Self-pay

## 2023-10-25 ENCOUNTER — Other Ambulatory Visit: Payer: Self-pay

## 2023-10-25 ENCOUNTER — Other Ambulatory Visit: Payer: Self-pay | Admitting: Family Medicine

## 2023-10-25 DIAGNOSIS — R52 Pain, unspecified: Secondary | ICD-10-CM

## 2023-10-25 DIAGNOSIS — M545 Low back pain, unspecified: Secondary | ICD-10-CM

## 2023-10-25 MED ORDER — TIZANIDINE HCL 4 MG PO TABS
4.0000 mg | ORAL_TABLET | Freq: Three times a day (TID) | ORAL | 0 refills | Status: DC | PRN
Start: 2023-10-25 — End: 2023-11-17
  Filled 2023-10-25: qty 90, 30d supply, fill #0

## 2023-10-25 MED ORDER — MELOXICAM 7.5 MG PO TABS
7.5000 mg | ORAL_TABLET | Freq: Every day | ORAL | 0 refills | Status: DC
  Filled 2023-10-25: qty 30, 30d supply, fill #0

## 2023-11-03 ENCOUNTER — Other Ambulatory Visit: Payer: Self-pay

## 2023-11-04 ENCOUNTER — Other Ambulatory Visit: Payer: Self-pay

## 2023-11-09 ENCOUNTER — Other Ambulatory Visit: Payer: Self-pay | Admitting: Pharmacist

## 2023-11-09 ENCOUNTER — Other Ambulatory Visit: Payer: Self-pay

## 2023-11-09 DIAGNOSIS — E782 Mixed hyperlipidemia: Secondary | ICD-10-CM

## 2023-11-09 DIAGNOSIS — R7303 Prediabetes: Secondary | ICD-10-CM

## 2023-11-09 MED ORDER — METFORMIN HCL 500 MG PO TABS: 500.0000 mg | ORAL_TABLET | Freq: Every day | ORAL | 0 refills | Status: DC

## 2023-11-09 MED ORDER — ROSUVASTATIN CALCIUM 20 MG PO TABS: 20.0000 mg | ORAL_TABLET | Freq: Every day | ORAL | 0 refills | Status: DC

## 2023-11-09 NOTE — Progress Notes (Signed)
 Pharmacy Quality Measure Review  This patient is appearing on a report for being at risk of failing the adherence measure for cholesterol (statin) and diabetes medications this calendar year.   Medication: metformin  Last fill date: 09/06/2023 for 30 day supply  Contacted pharmacy to facilitate refills.  Medication: rosuvastatin  Last fill date: 09/06/2023 for 30 day supply  Contacted pharmacy to facilitate refills.  Herlene Fleeta Morris, PharmD, JAQUELINE, CPP Clinical Pharmacist [facility] [phone]

## 2023-11-10 ENCOUNTER — Other Ambulatory Visit: Payer: Self-pay

## 2023-11-11 ENCOUNTER — Other Ambulatory Visit: Payer: Self-pay

## 2023-11-16 ENCOUNTER — Telehealth: Payer: Self-pay | Admitting: Family Medicine

## 2023-11-16 NOTE — Telephone Encounter (Signed)
 Called patient, no answer. Left voicemail confirming upcoming appointment on 11/17/2023 at 10:30.  Provided callback number for any questions or changes.

## 2023-11-17 ENCOUNTER — Other Ambulatory Visit: Payer: Self-pay

## 2023-11-17 ENCOUNTER — Ambulatory Visit: Attending: Family Medicine | Admitting: Family Medicine

## 2023-11-17 ENCOUNTER — Encounter: Payer: Self-pay | Admitting: Family Medicine

## 2023-11-17 VITALS — BP 115/76 | HR 62 | Ht 71.0 in | Wt 156.4 lb

## 2023-11-17 DIAGNOSIS — G8929 Other chronic pain: Secondary | ICD-10-CM

## 2023-11-17 DIAGNOSIS — R7303 Prediabetes: Secondary | ICD-10-CM | POA: Diagnosis not present

## 2023-11-17 DIAGNOSIS — Z780 Asymptomatic menopausal state: Secondary | ICD-10-CM | POA: Diagnosis not present

## 2023-11-17 DIAGNOSIS — E782 Mixed hyperlipidemia: Secondary | ICD-10-CM | POA: Diagnosis not present

## 2023-11-17 DIAGNOSIS — G4709 Other insomnia: Secondary | ICD-10-CM

## 2023-11-17 DIAGNOSIS — F32A Depression, unspecified: Secondary | ICD-10-CM

## 2023-11-17 DIAGNOSIS — E876 Hypokalemia: Secondary | ICD-10-CM | POA: Diagnosis not present

## 2023-11-17 DIAGNOSIS — E559 Vitamin D deficiency, unspecified: Secondary | ICD-10-CM | POA: Diagnosis not present

## 2023-11-17 DIAGNOSIS — R5383 Other fatigue: Secondary | ICD-10-CM | POA: Diagnosis not present

## 2023-11-17 DIAGNOSIS — K089 Disorder of teeth and supporting structures, unspecified: Secondary | ICD-10-CM | POA: Diagnosis not present

## 2023-11-17 DIAGNOSIS — H539 Unspecified visual disturbance: Secondary | ICD-10-CM | POA: Diagnosis not present

## 2023-11-17 DIAGNOSIS — N83202 Unspecified ovarian cyst, left side: Secondary | ICD-10-CM | POA: Diagnosis not present

## 2023-11-17 DIAGNOSIS — F17210 Nicotine dependence, cigarettes, uncomplicated: Secondary | ICD-10-CM

## 2023-11-17 DIAGNOSIS — K219 Gastro-esophageal reflux disease without esophagitis: Secondary | ICD-10-CM | POA: Diagnosis not present

## 2023-11-17 DIAGNOSIS — F17200 Nicotine dependence, unspecified, uncomplicated: Secondary | ICD-10-CM

## 2023-11-17 DIAGNOSIS — E785 Hyperlipidemia, unspecified: Secondary | ICD-10-CM | POA: Diagnosis not present

## 2023-11-17 DIAGNOSIS — Z8719 Personal history of other diseases of the digestive system: Secondary | ICD-10-CM

## 2023-11-17 MED ORDER — OMEPRAZOLE 40 MG PO CPDR
40.0000 mg | DELAYED_RELEASE_CAPSULE | Freq: Every day | ORAL | 1 refills | Status: AC
  Filled 2023-11-17 – 2023-12-01 (×2): qty 90, 90d supply, fill #0
  Filled 2023-12-16 – 2024-03-19 (×3): qty 90, 90d supply, fill #1

## 2023-11-17 MED ORDER — MELOXICAM 7.5 MG PO TABS
7.5000 mg | ORAL_TABLET | Freq: Every day | ORAL | 1 refills | Status: AC
  Filled 2023-11-17 – 2023-11-27 (×2): qty 90, 90d supply, fill #0
  Filled 2024-03-07 – 2024-03-19 (×2): qty 90, 90d supply, fill #1

## 2023-11-17 MED ORDER — NICOTINE 7 MG/24HR TD PT24
7.0000 mg | MEDICATED_PATCH | Freq: Every day | TRANSDERMAL | 2 refills | Status: AC
  Filled 2023-11-17 – 2023-12-07 (×2): qty 28, 28d supply, fill #0
  Filled 2024-02-07: qty 28, 28d supply, fill #1
  Filled 2024-03-07 – 2024-03-19 (×2): qty 28, 28d supply, fill #2

## 2023-11-17 MED ORDER — ESTRADIOL 0.5 MG PO TABS
0.5000 mg | ORAL_TABLET | Freq: Every day | ORAL | 3 refills | Status: AC
  Filled 2023-11-17 – 2023-11-27 (×2): qty 90, 90d supply, fill #0
  Filled 2024-02-28 – 2024-03-19 (×2): qty 90, 90d supply, fill #1

## 2023-11-17 MED ORDER — LIDOCAINE 5 % EX PTCH
1.0000 | MEDICATED_PATCH | CUTANEOUS | 6 refills | Status: AC
  Filled 2023-11-17 – 2024-02-07 (×5): qty 30, 30d supply, fill #0
  Filled 2024-03-13: qty 30, 30d supply, fill #1
  Filled 2024-04-17 – 2024-05-10 (×2): qty 30, 30d supply, fill #2

## 2023-11-17 MED ORDER — ROSUVASTATIN CALCIUM 20 MG PO TABS
20.0000 mg | ORAL_TABLET | Freq: Every day | ORAL | 1 refills | Status: DC
  Filled 2023-11-17 – 2023-12-07 (×2): qty 90, 90d supply, fill #0
  Filled 2024-03-07: qty 90, 90d supply, fill #1

## 2023-11-17 MED ORDER — GABAPENTIN 300 MG PO CAPS
300.0000 mg | ORAL_CAPSULE | Freq: Three times a day (TID) | ORAL | 1 refills | Status: AC
  Filled 2023-11-17 – 2023-12-01 (×2): qty 270, 90d supply, fill #0
  Filled 2024-03-07 – 2024-03-19 (×2): qty 270, 90d supply, fill #1

## 2023-11-17 MED ORDER — NICOTINE 14 MG/24HR TD PT24
14.0000 mg | MEDICATED_PATCH | Freq: Every day | TRANSDERMAL | 0 refills | Status: AC
  Filled 2023-11-17: qty 28, 28d supply, fill #0

## 2023-11-17 MED ORDER — QUETIAPINE FUMARATE 50 MG PO TABS
50.0000 mg | ORAL_TABLET | Freq: Every day | ORAL | 1 refills | Status: AC
  Filled 2023-11-17: qty 90, 90d supply, fill #0
  Filled 2024-02-13: qty 90, 90d supply, fill #1

## 2023-11-17 MED ORDER — VENLAFAXINE HCL ER 75 MG PO CP24
75.0000 mg | ORAL_CAPSULE | Freq: Every day | ORAL | 1 refills | Status: AC
  Filled 2023-11-17: qty 90, 90d supply, fill #0
  Filled 2024-02-13: qty 90, 90d supply, fill #1

## 2023-11-17 MED ORDER — TIZANIDINE HCL 4 MG PO TABS
4.0000 mg | ORAL_TABLET | Freq: Three times a day (TID) | ORAL | 1 refills | Status: AC | PRN
  Filled 2023-11-17 – 2023-11-27 (×2): qty 270, 90d supply, fill #0
  Filled 2024-03-29 – 2024-05-16 (×2): qty 270, 90d supply, fill #1

## 2023-11-17 MED ORDER — METFORMIN HCL 500 MG PO TABS
500.0000 mg | ORAL_TABLET | Freq: Every day | ORAL | 1 refills | Status: DC
  Filled 2023-11-17 – 2023-12-07 (×2): qty 90, 90d supply, fill #0
  Filled 2024-03-07: qty 90, 90d supply, fill #1

## 2023-11-17 NOTE — Progress Notes (Signed)
 Subjective:  Patient ID: Isabel Evans, female    DOB: Jun 28, 1970  Age: 52 y.o. MRN: [id number]  CC: Medical Management of Chronic Issues (Referral to OBGYN, dentist, eye doctor/Back pain)     Discussed the use of AI scribe software for clinical note transcription with the patient, who gave verbal consent to proceed.  History of Present Illness Isabel Evans is a 53 year old female with a history of Anxiety and Depression, spondylosis with cervical radiculopathy (status post cervical spine fusion), Dupuytren's contracture (status post left fifth finger amputation), chronic low back pain who presents with left-sided pain and fatigue.  She experiences left lower quadrant abdominal pain for the past two to three weeks, distinct from her usual fibromyalgia symptoms. She has a 2.1 cm left ovarian cyst and is concerned about its relation to her pain. Significant fatigue and daytime tiredness persist despite adequate nighttime sleep, she does have a hysterectomy.  She is on estradiol  and gabapentin . She takes Lexapro  for anxiety and previously tolerated Effexor  well. Pelvic ultrasound from 06/2023 revealed: IMPRESSION: 1. 2.1 cm simple appearing cyst of the left ovary. This requires no follow-up. 2. No evidence of ovarian torsion. 3. Status post hysterectomy.  She manages prediabetes with metformin , with a recent A1c of 5.8. She experiences leg cramps in her left leg and easy bruising. She takes Crestor  and has a history of hypokalemia with last labs revealing a potassium of 3.3.  She has back pain which currently does not radiate managed by Dr. Barbarann, using gabapentin , lidocaine  patches, and meloxicam . She has a history of hip bursitis, relieved by a cortisone injection.  She resumed smoking a pack a day after quitting for nearly five years and is considering nicotine  patches to quit again.  She has dental issues, including a broken denture and cavities, and vision problems, using  over-the-counter reading glasses.  She would like a referral to both her dentist and ophthalmologist even though I had placed a referral and follow-up last year she states she never heard back from them.    Past Medical History:  Diagnosis Date   Anemia 11/24/2017   Anxiety    Bone spur    cervical spine   Chronic kidney disease 04/2017   kidney infections   Depression    Deviated nasal septum    states is unable to lie flat, because her nose will become congested and she can stop breathing   Diabetes mellitus without complication (HCC)    Dysrhythmia    heart flutters occasionally   Fibromyalgia    Fibromyalgia    GERD (gastroesophageal reflux disease)    Hallux abductovalgus with bunions 09/2014   right    Hammertoe 09/2014   right 4th, 5th   History of stomach ulcers    Hyperlipidemia    Hypertension    states is under control with med., has been on med. x 8 mos.   IBS (irritable bowel syndrome)    no current med.   Osteoarthritis    hands, bilateral hips   Peripheral vascular disease (HCC)    occasional swelling in both legs   Sinus headache    UTI (urinary tract infection) 05/21/2013    Past Surgical History:  Procedure Laterality Date   ABDOMINAL HYSTERECTOMY     ANTERIOR CERVICAL DECOMP/DISCECTOMY FUSION N/A 07/30/2020   Procedure: C5-6 ANTERIOR CERVICAL DECOMPRESSION/DISCECTOMY FUSION, ALLOGRAFT, PLATE;  Surgeon: Barbarann Oneil BROCKS, MD;  Location: [facility];  Service: Orthopedics;  Laterality: N/A;   BREAST BIOPSY Left  04/2018   benign   BREAST BIOPSY Left 2016   benign   BUNIONECTOMY Right 10/04/2014   Procedure:  MASSIE EDWARDS, ARVILLA EDWARDS;  Surgeon: Donnice JONELLE Fees, DPM;  Location: [facility];  Service: Podiatry;  Laterality: Right;   COLONOSCOPY WITH PROPOFOL   02/13/2013   CYSTOSCOPY N/A 07/07/2017   Procedure: CYSTOSCOPY;  Surgeon: Izell Harari, MD;  Location: [facility];  Service: Gynecology;  Laterality: N/A;   DUPUYTREN / PALMAR  FASCIOTOMY Right 07/18/2013   exc. 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   small finger   HAMMER TOE SURGERY Right 10/04/2014   Procedure: HAMMER TOE REPAIR 4TH  AND 5TH  RIGHT FOOT  fourth toe fixation with k wire;  Surgeon: Donnice JONELLE Fees, DPM;  Location: [facility];  Service: Podiatry;  Laterality: Right;   LEG SURGERY Left    as a child; near amputation of leg   TUBAL LIGATION     VAGINAL HYSTERECTOMY N/A 07/07/2017   Procedure: HYSTERECTOMY VAGINAL;  Surgeon: Izell Harari, MD;  Location: [facility];  Service: Gynecology;  Laterality: N/A;    Family History  Problem Relation Age of Onset   Hypertension Mother    Diabetes Mother    Heart disease Mother    Hypertension Father    Diabetes Father    Prostate cancer Father    Heart disease Father    Alcohol abuse Father    Hypertension Sister    Diabetes Sister    Heart disease Maternal Grandmother        Great GM   Breast cancer Maternal Grandmother    Bone cancer Maternal Grandmother    Breast cancer Maternal Aunt    Ovarian cancer Maternal Aunt    Rheum arthritis Sister    Colon cancer Other        great Aunt   Rectal cancer Neg Hx    Stomach cancer Neg Hx     Social History   Socioeconomic History   Marital status: Single    Spouse name: Not on file   Number of children: 3   Years of education: Not on file   Highest education level: 7th grade  Occupational History   Occupation: cook  Tobacco Use   Smoking status: Former    Current packs/day: 0.00    Types: Cigarettes, E-cigarettes    Start date: 08/24/1992    Quit date: 08/24/2012    Years since quitting: 11.2   Smokeless tobacco: Never  Vaping Use   Vaping status: Every Day   Substances: Flavoring  Substance and Sexual Activity   Alcohol use: Yes    Comment: occasionally   Drug use: Yes    Types: Marijuana    Comment: occ.   Sexual activity: Yes    Partners: Male    Birth control/protection: Surgical     Comment: 1 partner  Other Topics Concern   Not on file  Social History Narrative   Not on file   Social Drivers of Health   Financial Resource Strain: Medium Risk (02/24/2023)   Overall Financial Resource Strain (CARDIA)    Difficulty of Paying Living Expenses: Somewhat hard  Food Insecurity: Food Insecurity Present (02/24/2023)   Hunger Vital Sign    Worried About Running Out of Food in the Last Year: Sometimes true    Ran Out of Food in the Last Year: Sometimes true  Transportation Needs: Unmet Transportation Needs (02/24/2023)   PRAPARE - Transportation    Lack  of Transportation (Medical): Yes    Lack of Transportation (Non-Medical): Yes  Physical Activity: Inactive (02/24/2023)   Exercise Vital Sign    Days of Exercise per Week: 0 days    Minutes of Exercise per Session: 0 min  Stress: No Stress Concern Present (02/24/2023)   [employer] of Occupational Health - Occupational Stress Questionnaire    Feeling of Stress : Only a little  Social Connections: Moderately Isolated (02/24/2023)   Social Connection and Isolation Panel    Frequency of Communication with Friends and Family: Twice a week    Frequency of Social Gatherings with Friends and Family: Once a week    Attends Religious Services: More than 4 times per year    Active Member of [employer] or Organizations: No    Attends Banker Meetings: Never    Marital Status: Never married    No Known Allergies  Outpatient Medications Prior to Visit  Medication Sig Dispense Refill   clobetasol  cream (TEMOVATE ) 0.05 % Apply to affected areas on elbow and back of neck 2 (two) times daily. 60 g 1   dicyclomine  (BENTYL ) 20 MG tablet Take 1 tablet (20 mg total) by mouth 2 (two) times daily. 20 tablet 0   diphenhydrAMINE  (BENADRYL ) 25 MG tablet Take 1 tablet (25 mg total) by mouth daily. 30 tablet 0   fluticasone  (FLONASE ) 50 MCG/ACT nasal spray PLACE 2 SPRAYS INTO BOTH NOSTRILS DAILY. 16 g 1   ondansetron  (ZOFRAN -ODT) 4  MG disintegrating tablet Take 1 tablet (4 mg total) by mouth every 8 (eight) hours as needed. 20 tablet 0   traMADol  (ULTRAM ) 50 MG tablet Take 1 tablet (50 mg total) by mouth at bedtime as needed. 30 tablet 3   escitalopram  (LEXAPRO ) 20 MG tablet Take 1 tablet (20 mg total) by mouth daily. 30 tablet 1   estradiol  (ESTRACE ) 0.5 MG tablet Take 1 tablet (0.5 mg total) by mouth daily. 90 tablet 3   gabapentin  (NEURONTIN ) 300 MG capsule Take 1 capsule (300 mg total) by mouth 3 (three) times daily. 270 capsule 1   lidocaine  (LIDODERM ) 5 % Place 1 patch onto the skin daily. Remove & Discard patch within 12 hours or as directed by MD 30 patch 0   meloxicam  (MOBIC ) 7.5 MG tablet Take 1 tablet (7.5 mg total) by mouth daily. 30 tablet 0   metFORMIN  (GLUCOPHAGE ) 500 MG tablet Take 1 tablet (500 mg total) by mouth daily with breakfast. 30 tablet 0   omeprazole  (PRILOSEC) 40 MG capsule Take 1 capsule (40 mg total) by mouth daily. 90 capsule 1   QUEtiapine  (SEROQUEL ) 100 MG tablet Take 1 tablet (100 mg total) by mouth at bedtime. 90 tablet 1   rosuvastatin  (CRESTOR ) 20 MG tablet TAKE 1 TABLET (20 MG TOTAL) BY MOUTH DAILY TO LOWER CHOLESTEROL 30 tablet 0   tiZANidine  (ZANAFLEX ) 4 MG tablet Take 1 tablet (4 mg total) by mouth every 8 (eight) hours as needed. 90 tablet 0   No facility-administered medications prior to visit.     ROS Review of Systems  Constitutional:  Negative for activity change and appetite change.  HENT:  Negative for sinus pressure and sore throat.   Respiratory:  Negative for chest tightness, shortness of breath and wheezing.   Cardiovascular:  Negative for chest pain and palpitations.  Gastrointestinal:  Positive for abdominal pain. Negative for abdominal distention and constipation.  Genitourinary: Negative.   Musculoskeletal:  Positive for back pain.  Psychiatric/Behavioral:  Negative for behavioral problems and  dysphoric mood.     Objective:  BP 115/76   Pulse 62   Ht 5' 11  (1.803 m)   Wt 156 lb 6.4 oz (70.9 kg)   LMP 07/04/2017   SpO2 99%   BMI 21.81 kg/m      11/17/2023   10:44 AM 06/28/2023    1:39 PM 06/28/2023   11:59 AM  BP/Weight  Systolic BP 115 117 128  Diastolic BP 76 81 78  Wt. (Lbs) 156.4    BMI 21.81 kg/m2        Physical Exam Constitutional:      Appearance: She is well-developed.  Cardiovascular:     Rate and Rhythm: Normal rate.     Heart sounds: Normal heart sounds. No murmur heard. Pulmonary:     Effort: Pulmonary effort is normal.     Breath sounds: Normal breath sounds. No wheezing or rales.  Chest:     Chest wall: No tenderness.  Abdominal:     General: Bowel sounds are normal. There is no distension.     Palpations: Abdomen is soft. There is no mass.     Tenderness: There is abdominal tenderness (LLQ).  Musculoskeletal:     Right lower leg: No edema.     Left lower leg: No edema.     Comments: Lumbar spine tenderness on range of motion  Neurological:     Mental Status: She is alert and oriented to person, place, and time.  Psychiatric:        Mood and Affect: Mood normal.        Latest Ref Rng & Units 06/27/2023    7:07 PM 09/22/2022   11:44 AM 05/25/2021    9:26 AM  CMP  Glucose 70 - 99 mg/dL 892  888  896   BUN 6 - 20 mg/dL 7  4  9    Creatinine 0.44 - 1.00 mg/dL 9.21  9.31  9.26   Sodium 135 - 145 mmol/L 135  140  140   Potassium 3.5 - 5.1 mmol/L 3.3  4.0  4.4   Chloride 98 - 111 mmol/L 98  101  101   CO2 22 - 32 mmol/L 28  26  26    Calcium  8.9 - 10.3 mg/dL 8.9  9.5  9.8   Total Protein 6.5 - 8.1 g/dL 7.0  7.3    Total Bilirubin 0.0 - 1.2 mg/dL 0.5  <9.7    Alkaline Phos 38 - 126 U/L 56  78    AST 15 - 41 U/L 17  18    ALT 0 - 44 U/L 17  15      Lipid Panel     Component Value Date/Time   CHOL 202 (H) 09/22/2022 1144   TRIG 177 (H) 09/22/2022 1144   HDL 67 09/22/2022 1144   CHOLHDL 2.1 11/24/2020 0915   LDLCALC 105 (H) 09/22/2022 1144    CBC    Component Value Date/Time   WBC 10.4  06/27/2023 1907   RBC 4.33 06/27/2023 1907   HGB 13.1 06/27/2023 1907   HGB 14.1 09/22/2022 1144   HCT 39.4 06/27/2023 1907   HCT 43.1 09/22/2022 1144   PLT 197 06/27/2023 1907   PLT 271 09/22/2022 1144   MCV 91.0 06/27/2023 1907   MCV 92 09/22/2022 1144   MCH 30.3 06/27/2023 1907   MCHC 33.2 06/27/2023 1907   RDW 13.1 06/27/2023 1907   RDW 12.9 09/22/2022 1144   LYMPHSABS 2.0 09/22/2022 1144   MONOABS 0.5  03/24/2020 1228   EOSABS 0.6 (H) 09/22/2022 1144   BASOSABS 0.1 09/22/2022 1144    Lab Results  Component Value Date   HGBA1C 5.8 (H) 09/22/2022    Lab Results  Component Value Date   TSH 0.606 01/04/2020         Assessment & Plan Menopausal symptoms with fatigue and sleep disturbance Fatigue and sleep disturbances likely due to menopause. Seroquel  100mg  causes excessive sleepiness. Considering Effexor  for depression and menopausal symptoms. - Decrease Seroquel  to 50 mg at bedtime. - Switch from Lexapro  to Effexor . - Refill estradiol  prescription. - Check vitamin D  levels. - Check thyroid  function.  Ovarian cyst, left, with left lower quadrant pain Left-sided pain possibly related to a 2.1 cm left ovarian cyst. No torsion noted previously. - Make referral to GYN for evaluation of left ovarian cyst and associated pain.  Depression Considering Effexor  to address depression and menopausal symptoms. - Switch from Lexapro  to Effexor .  Prediabetes Previously diagnosed with prediabetes. Last A1c was 5.8%. - Check A1c to monitor prediabetes status.  Hyperlipidemia Managed with Crestor . - Refill Crestor  prescription. - Check lipid panel today  Gastroesophageal reflux disease (GERD) Managed with omeprazole . - Refill omeprazole  prescription.  Nicotine  dependence, cigarettes Desires to quit smoking. Prefers not to use vapes. - Start nicotine  patches to aid in smoking cessation.  Hypokalemia, history of, with muscle cramps Muscle cramps possibly related to  hypokalemia. Previous potassium level was 3.3. - Check potassium levels. - Consider potassium supplementation if levels are low. - Recommend magnesium for muscle cramps if potassium is normal.  Dental problems (cavities, cracked tooth, denture issues) Reports cavities and a cracked tooth. Previous denture issues noted. - Provide referral to dentist for evaluation and treatment of dental issues.  Abnormal vision Reports difficulty seeing, requiring brightening of phone screen and use of over-the-counter glasses. - Provide referral to ophthalmologist for evaluation of vision issues.   Meds ordered this encounter  Medications   QUEtiapine  (SEROQUEL ) 50 MG tablet    Sig: Take 1 tablet (50 mg total) by mouth at bedtime.    Dispense:  90 tablet    Refill:  1    Dose decrease   venlafaxine  XR (EFFEXOR  XR) 75 MG 24 hr capsule    Sig: Take 1 capsule (75 mg total) by mouth daily with breakfast.    Dispense:  90 capsule    Refill:  1    Discontinue Lexapro    estradiol  (ESTRACE ) 0.5 MG tablet    Sig: Take 1 tablet (0.5 mg total) by mouth daily.    Dispense:  90 tablet    Refill:  3   gabapentin  (NEURONTIN ) 300 MG capsule    Sig: Take 1 capsule (300 mg total) by mouth 3 (three) times daily.    Dispense:  270 capsule    Refill:  1   lidocaine  (LIDODERM ) 5 %    Sig: Place 1 patch onto the skin daily. Remove & Discard patch within 12 hours or as directed by MD    Dispense:  30 patch    Refill:  6   meloxicam  (MOBIC ) 7.5 MG tablet    Sig: Take 1 tablet (7.5 mg total) by mouth daily.    Dispense:  90 tablet    Refill:  1   metFORMIN  (GLUCOPHAGE ) 500 MG tablet    Sig: Take 1 tablet (500 mg total) by mouth daily with breakfast.    Dispense:  90 tablet    Refill:  1   omeprazole  (PRILOSEC) 40  MG capsule    Sig: Take 1 capsule (40 mg total) by mouth daily.    Dispense:  90 capsule    Refill:  1   rosuvastatin  (CRESTOR ) 20 MG tablet    Sig: TAKE 1 TABLET (20 MG TOTAL) BY MOUTH DAILY TO  LOWER CHOLESTEROL    Dispense:  90 tablet    Refill:  1   tiZANidine  (ZANAFLEX ) 4 MG tablet    Sig: Take 1 tablet (4 mg total) by mouth every 8 (eight) hours as needed.    Dispense:  270 tablet    Refill:  1   nicotine  (NICODERM CQ ) 14 mg/24hr patch    Sig: Place 1 patch (14 mg total) onto the skin daily. For 4 weeks then decrease to 7 mg    Dispense:  28 patch    Refill:  0   nicotine  (NICODERM CQ ) 7 mg/24hr patch    Sig: Place 1 patch (7 mg total) onto the skin daily.    Dispense:  28 patch    Refill:  2   Visit required 46 minutes of patient care including median intraservice time, reviewing previous notes and test results, coordination of care, counseling the patient in addition to management of chronic medical conditions.Time also spent ordering medications, investigations and documenting in the chart.  All questions were answered to the patient's satisfaction  Follow-up: Return in about 6 months (around 05/19/2024) for Chronic medical conditions.       Corrina Sabin, MD, FAAFP. [facility] [place], [place] [phone]   11/17/2023, 2:41 PM

## 2023-11-17 NOTE — Patient Instructions (Addendum)
 Dentist: Sent Referral to  Dr. Camellia Cagey   [address]  [phone] Fax # [phone]  Ophthalmology: Sent Referral to [facility] [place] [address] [place]. [address], [place] [address] [phone]

## 2023-11-18 ENCOUNTER — Other Ambulatory Visit: Payer: Self-pay

## 2023-11-18 ENCOUNTER — Ambulatory Visit: Payer: Self-pay | Admitting: Family Medicine

## 2023-11-18 LAB — HEMOGLOBIN A1C
Est. average glucose Bld gHb Est-mCnc: 114 mg/dL
Hgb A1c MFr Bld: 5.6 % (ref 4.8–5.6)

## 2023-11-18 LAB — VITAMIN D 25 HYDROXY (VIT D DEFICIENCY, FRACTURES): Vit D, 25-Hydroxy: 21.5 ng/mL — ABNORMAL LOW (ref 30.0–100.0)

## 2023-11-18 LAB — LP+NON-HDL CHOLESTEROL
Cholesterol, Total: 175 mg/dL (ref 100–199)
HDL: 76 mg/dL (ref >39)
LDL Chol Calc (NIH): 84 mg/dL (ref 0–99)
Total Non-HDL-Chol (LDL+VLDL): 99 mg/dL (ref 0–129)
Triglycerides: 79 mg/dL (ref 0–149)
VLDL Cholesterol Cal: 15 mg/dL (ref 5–40)

## 2023-11-18 LAB — POTASSIUM: Potassium: 4.6 mmol/L (ref 3.5–5.2)

## 2023-11-18 MED ORDER — ERGOCALCIFEROL 1.25 MG (50000 UT) PO CAPS
50000.0000 [IU] | ORAL_CAPSULE | ORAL | 1 refills | Status: DC
  Filled 2023-11-18 – 2023-11-23 (×4): qty 12, 84d supply, fill #0
  Filled 2024-02-13: qty 12, 84d supply, fill #1

## 2023-11-23 ENCOUNTER — Other Ambulatory Visit: Payer: Self-pay

## 2023-11-28 ENCOUNTER — Other Ambulatory Visit: Payer: Self-pay

## 2023-12-01 ENCOUNTER — Other Ambulatory Visit: Payer: Self-pay | Admitting: Family Medicine

## 2023-12-01 ENCOUNTER — Other Ambulatory Visit: Payer: Self-pay

## 2023-12-02 ENCOUNTER — Other Ambulatory Visit: Payer: Self-pay

## 2023-12-02 MED ORDER — ONDANSETRON 4 MG PO TBDP
4.0000 mg | ORAL_TABLET | Freq: Three times a day (TID) | ORAL | 0 refills | Status: DC | PRN
Start: 2023-12-02 — End: 2023-12-16
  Filled 2023-12-02: qty 20, 7d supply, fill #0

## 2023-12-06 ENCOUNTER — Other Ambulatory Visit: Payer: Self-pay

## 2023-12-07 ENCOUNTER — Other Ambulatory Visit: Payer: Self-pay | Admitting: Family Medicine

## 2023-12-07 ENCOUNTER — Other Ambulatory Visit: Payer: Self-pay

## 2023-12-07 DIAGNOSIS — F411 Generalized anxiety disorder: Secondary | ICD-10-CM

## 2023-12-07 MED ORDER — FLUTICASONE PROPIONATE 50 MCG/ACT NA SUSP
2.0000 | Freq: Every day | NASAL | 1 refills | Status: DC
  Filled 2023-12-07: qty 16, 30d supply, fill #0
  Filled 2024-02-07: qty 16, 30d supply, fill #1

## 2023-12-08 ENCOUNTER — Other Ambulatory Visit: Payer: Self-pay

## 2023-12-08 MED ORDER — DICYCLOMINE HCL 20 MG PO TABS
20.0000 mg | ORAL_TABLET | Freq: Two times a day (BID) | ORAL | 0 refills | Status: DC
  Filled 2023-12-08: qty 20, 10d supply, fill #0

## 2023-12-09 ENCOUNTER — Other Ambulatory Visit (HOSPITAL_COMMUNITY): Payer: Self-pay

## 2023-12-16 ENCOUNTER — Other Ambulatory Visit: Payer: Self-pay | Admitting: Family Medicine

## 2023-12-17 ENCOUNTER — Other Ambulatory Visit: Payer: Self-pay

## 2023-12-19 MED ORDER — DICYCLOMINE HCL 20 MG PO TABS
20.0000 mg | ORAL_TABLET | Freq: Two times a day (BID) | ORAL | 0 refills | Status: DC
  Filled 2023-12-19: qty 20, 10d supply, fill #0

## 2023-12-19 MED ORDER — ONDANSETRON 4 MG PO TBDP
4.0000 mg | ORAL_TABLET | Freq: Three times a day (TID) | ORAL | 0 refills | Status: DC | PRN
  Filled 2023-12-19: qty 20, 7d supply, fill #0

## 2023-12-19 NOTE — Telephone Encounter (Signed)
 Requested medication (s) are due for refill today: Yes  Requested medication (s) are on the active medication list: Yes  Last refill:  12/08/23  Future visit scheduled: No  Notes to clinic:  Unable to refill per protocol, cannot delegate ondansetron . Unsure if provider wants refill on dicyclomine  since only 20 pills given last refill, routing for approval.     Requested Prescriptions  Pending Prescriptions Disp Refills   ondansetron  (ZOFRAN -ODT) 4 MG disintegrating tablet 20 tablet 0    Sig: Take 1 tablet (4 mg total) by mouth every 8 (eight) hours as needed.     Not Delegated - Gastroenterology: Antiemetics - ondansetron  Failed - 12/19/2023  9:18 AM      Failed - This refill cannot be delegated      Passed - AST in normal range and within 360 days    AST  Date Value Ref Range Status  06/27/2023 17 15 - 41 U/L Final         Passed - ALT in normal range and within 360 days    ALT  Date Value Ref Range Status  06/27/2023 17 0 - 44 U/L Final         Passed - Valid encounter within last 6 months    Recent Outpatient Visits           1 month ago Other insomnia   [facility] [place]. [facility] Delbert Clam, MD   9 months ago Wears dentures   [facility] [place]. [facility] Delbert Clam, MD   1 year ago Dupuytren's contracture of both hands   [place] [facility] [place]. [facility] Delbert Clam, MD   1 year ago Essential hypertension   [facility] [place]. [facility] Brien Belvie BRAVO, MD   2 years ago Pre-diabetes   [facility] [place]. [facility] [place], Clam, MD               dicyclomine  (BENTYL ) 20 MG tablet 20 tablet 0    Sig: Take 1 tablet (20 mg total) by mouth 2 (two) times daily.     Gastroenterology:  Antispasmodic Agents Passed -  12/19/2023  9:18 AM      Passed - Valid encounter within last 12 months    Recent Outpatient Visits           1 month ago Other insomnia   [facility] [place]. [facility] Delbert Clam, MD   9 months ago Wears dentures   [facility] [facility] [place]. [facility] Delbert Clam, MD   1 year ago Dupuytren's contracture of both hands   [place] [facility] [place]. [facility] Delbert Clam, MD   1 year ago Essential hypertension   [place] [place]. [facility] Brien Belvie BRAVO, MD   2 years ago Pre-diabetes    [facility] . [facility] Delbert Clam, MD

## 2023-12-20 ENCOUNTER — Other Ambulatory Visit: Payer: Self-pay

## 2023-12-23 ENCOUNTER — Encounter: Payer: Self-pay | Admitting: Family Medicine

## 2023-12-23 DIAGNOSIS — E559 Vitamin D deficiency, unspecified: Secondary | ICD-10-CM | POA: Insufficient documentation

## 2023-12-26 ENCOUNTER — Other Ambulatory Visit: Payer: Self-pay

## 2024-02-01 ENCOUNTER — Other Ambulatory Visit: Payer: Self-pay | Admitting: Pharmacist

## 2024-02-01 NOTE — Progress Notes (Signed)
 Pharmacy Quality Measure Review  This patient is appearing on a report for being at risk of failing the adherence measure for cholesterol (statin) and diabetes medications this calendar year.   Medication: metformin  Last fill date: 12/08/23 for 90 day supply  Insurance report was not up to date. No action needed at this time.   Medication: rosuvastatin   Last fill date: 12/08/23 for 90 day supply  Insurance report was not up to date. No action needed at this time.   Reminder set for 03/10/2024 refills.   Herlene Fleeta Morris, PharmD, JAQUELINE, CPP Clinical Pharmacist [facility] [phone]

## 2024-02-07 ENCOUNTER — Other Ambulatory Visit: Payer: Self-pay | Admitting: Family Medicine

## 2024-02-08 ENCOUNTER — Other Ambulatory Visit: Payer: Self-pay

## 2024-02-08 MED ORDER — DICYCLOMINE HCL 20 MG PO TABS
20.0000 mg | ORAL_TABLET | Freq: Two times a day (BID) | ORAL | 0 refills | Status: DC
  Filled 2024-02-08: qty 20, 10d supply, fill #0

## 2024-02-08 MED ORDER — CLOBETASOL PROPIONATE 0.05 % EX CREA
1.0000 | TOPICAL_CREAM | Freq: Two times a day (BID) | CUTANEOUS | 1 refills | Status: AC
  Filled 2024-02-08: qty 60, 30d supply, fill #0
  Filled 2024-03-13: qty 60, 30d supply, fill #1

## 2024-02-13 ENCOUNTER — Other Ambulatory Visit: Payer: Self-pay

## 2024-02-14 ENCOUNTER — Other Ambulatory Visit: Payer: Self-pay

## 2024-02-16 ENCOUNTER — Other Ambulatory Visit: Payer: Self-pay | Admitting: Family Medicine

## 2024-02-16 DIAGNOSIS — M545 Low back pain, unspecified: Secondary | ICD-10-CM

## 2024-02-17 ENCOUNTER — Other Ambulatory Visit: Payer: Self-pay

## 2024-02-17 MED ORDER — TRAMADOL HCL 50 MG PO TABS
50.0000 mg | ORAL_TABLET | Freq: Every evening | ORAL | 3 refills | Status: AC | PRN
  Filled 2024-02-17: qty 30, 30d supply, fill #0

## 2024-02-17 MED ORDER — DICYCLOMINE HCL 20 MG PO TABS
20.0000 mg | ORAL_TABLET | Freq: Two times a day (BID) | ORAL | 0 refills | Status: DC
  Filled 2024-02-17 – 2024-02-20 (×2): qty 20, 10d supply, fill #0

## 2024-02-18 ENCOUNTER — Other Ambulatory Visit: Payer: Self-pay

## 2024-02-20 ENCOUNTER — Encounter: Payer: Self-pay | Admitting: Radiology

## 2024-02-20 ENCOUNTER — Other Ambulatory Visit: Payer: Self-pay

## 2024-02-21 ENCOUNTER — Other Ambulatory Visit: Payer: Self-pay

## 2024-02-28 ENCOUNTER — Other Ambulatory Visit: Payer: Self-pay

## 2024-02-28 ENCOUNTER — Other Ambulatory Visit: Payer: Self-pay | Admitting: Family Medicine

## 2024-02-28 ENCOUNTER — Encounter: Payer: Self-pay | Admitting: Family Medicine

## 2024-02-28 DIAGNOSIS — F411 Generalized anxiety disorder: Secondary | ICD-10-CM

## 2024-02-28 MED ORDER — FLUTICASONE PROPIONATE 50 MCG/ACT NA SUSP
2.0000 | Freq: Every day | NASAL | 1 refills | Status: AC
  Filled 2024-02-28 – 2024-03-13 (×2): qty 16, 30d supply, fill #0
  Filled 2024-03-29 – 2024-05-10 (×3): qty 16, 30d supply, fill #1

## 2024-02-29 ENCOUNTER — Other Ambulatory Visit: Payer: Self-pay

## 2024-03-07 ENCOUNTER — Other Ambulatory Visit: Payer: Self-pay

## 2024-03-07 ENCOUNTER — Other Ambulatory Visit: Payer: Self-pay | Admitting: Pharmacist

## 2024-03-07 NOTE — Progress Notes (Signed)
 Pharmacy Quality Measure Review  This patient is appearing on a report for being at risk of failing the adherence measure for cholesterol (statin) and diabetes medications this calendar year.   Medication: metformin  Last fill date: 12/08/23 for 90 day supply  Medication: rosuvastatin   Last fill date: 12/08/23 for 90 day supply  Collaborated with our pharmacy. Rxns are being refilled.   Herlene Fleeta Morris, PharmD, JAQUELINE, CPP Clinical Pharmacist [facility] [phone]

## 2024-03-09 ENCOUNTER — Encounter: Payer: Self-pay | Admitting: Pharmacist

## 2024-03-13 ENCOUNTER — Other Ambulatory Visit: Payer: Self-pay | Admitting: Family Medicine

## 2024-03-13 ENCOUNTER — Other Ambulatory Visit: Payer: Self-pay

## 2024-03-13 MED ORDER — DICYCLOMINE HCL 20 MG PO TABS
20.0000 mg | ORAL_TABLET | Freq: Two times a day (BID) | ORAL | 0 refills | Status: DC
  Filled 2024-03-13: qty 20, 10d supply, fill #0

## 2024-03-14 ENCOUNTER — Other Ambulatory Visit: Payer: Self-pay

## 2024-03-14 ENCOUNTER — Other Ambulatory Visit: Payer: Self-pay | Admitting: Pharmacist

## 2024-03-14 DIAGNOSIS — R7303 Prediabetes: Secondary | ICD-10-CM

## 2024-03-14 DIAGNOSIS — E782 Mixed hyperlipidemia: Secondary | ICD-10-CM

## 2024-03-14 MED ORDER — ROSUVASTATIN CALCIUM 20 MG PO TABS
20.0000 mg | ORAL_TABLET | Freq: Every day | ORAL | 0 refills | Status: AC
  Filled 2024-03-19: qty 90, 90d supply, fill #0

## 2024-03-14 MED ORDER — METFORMIN HCL 500 MG PO TABS
500.0000 mg | ORAL_TABLET | Freq: Every day | ORAL | 0 refills | Status: AC
  Filled 2024-03-19: qty 90, 90d supply, fill #0

## 2024-03-14 NOTE — Progress Notes (Signed)
 Pharmacy Quality Measure Review  This patient is appearing on a report for being at risk of failing the adherence measure for cholesterol (statin) and diabetes medications this calendar year.   Medication: metformin  Last fill date: 12/08/23 for 90 day supply  Medication: rosuvastatin   Last fill date: 12/08/23 for 90 day supply  Collaborated with our pharmacy. Rxns are being refilled.   Herlene Fleeta Morris, PharmD, JAQUELINE, CPP Clinical Pharmacist [facility] [phone]

## 2024-03-16 ENCOUNTER — Other Ambulatory Visit: Payer: Self-pay

## 2024-03-20 ENCOUNTER — Other Ambulatory Visit: Payer: Self-pay

## 2024-03-29 ENCOUNTER — Other Ambulatory Visit: Payer: Self-pay | Admitting: Family Medicine

## 2024-03-29 ENCOUNTER — Other Ambulatory Visit: Payer: Self-pay

## 2024-03-29 MED ORDER — ONDANSETRON 4 MG PO TBDP
4.0000 mg | ORAL_TABLET | Freq: Three times a day (TID) | ORAL | 0 refills | Status: AC | PRN
  Filled 2024-03-29 – 2024-05-16 (×2): qty 20, 7d supply, fill #0

## 2024-04-09 ENCOUNTER — Other Ambulatory Visit: Payer: Self-pay

## 2024-04-17 ENCOUNTER — Other Ambulatory Visit: Payer: Self-pay | Admitting: Family Medicine

## 2024-04-17 ENCOUNTER — Other Ambulatory Visit: Payer: Self-pay

## 2024-04-17 MED ORDER — ERGOCALCIFEROL 1.25 MG (50000 UT) PO CAPS
50000.0000 [IU] | ORAL_CAPSULE | ORAL | 0 refills | Status: AC
  Filled 2024-04-17 – 2024-05-10 (×2): qty 4, 28d supply, fill #0

## 2024-04-17 MED ORDER — DICYCLOMINE HCL 20 MG PO TABS
20.0000 mg | ORAL_TABLET | Freq: Two times a day (BID) | ORAL | 0 refills | Status: AC
  Filled 2024-04-17 – 2024-05-10 (×2): qty 20, 10d supply, fill #0

## 2024-04-27 ENCOUNTER — Other Ambulatory Visit: Payer: Self-pay

## 2024-05-10 ENCOUNTER — Other Ambulatory Visit: Payer: Self-pay

## 2024-05-16 ENCOUNTER — Other Ambulatory Visit: Payer: Self-pay | Admitting: Family Medicine

## 2024-05-16 ENCOUNTER — Other Ambulatory Visit: Payer: Self-pay

## 2024-05-16 DIAGNOSIS — F419 Anxiety disorder, unspecified: Secondary | ICD-10-CM

## 2024-05-16 DIAGNOSIS — Z780 Asymptomatic menopausal state: Secondary | ICD-10-CM

## 2024-05-16 DIAGNOSIS — G4709 Other insomnia: Secondary | ICD-10-CM

## 2024-05-21 ENCOUNTER — Other Ambulatory Visit: Payer: Self-pay | Admitting: Family Medicine

## 2024-05-21 ENCOUNTER — Other Ambulatory Visit: Payer: Self-pay

## 2024-05-21 DIAGNOSIS — F419 Anxiety disorder, unspecified: Secondary | ICD-10-CM

## 2024-05-21 DIAGNOSIS — G4709 Other insomnia: Secondary | ICD-10-CM

## 2024-05-21 DIAGNOSIS — Z780 Asymptomatic menopausal state: Secondary | ICD-10-CM

## 2024-05-25 ENCOUNTER — Other Ambulatory Visit: Payer: Self-pay
